# Patient Record
Sex: Female | Born: 1975 | Hispanic: No | Marital: Single | Smoking: Former smoker
Health system: Southern US, Community
[De-identification: ages and names within clinical notes are randomized; demographics above are authoritative.]

## PROBLEM LIST (undated history)

## (undated) DIAGNOSIS — Z862 Personal history of diseases of the blood and blood-forming organs and certain disorders involving the immune mechanism: Secondary | ICD-10-CM

## (undated) DIAGNOSIS — K219 Gastro-esophageal reflux disease without esophagitis: Secondary | ICD-10-CM

## (undated) DIAGNOSIS — Z8701 Personal history of pneumonia (recurrent): Secondary | ICD-10-CM

## (undated) DIAGNOSIS — F329 Major depressive disorder, single episode, unspecified: Secondary | ICD-10-CM

## (undated) DIAGNOSIS — U071 COVID-19: Secondary | ICD-10-CM

## (undated) DIAGNOSIS — I776 Arteritis, unspecified: Secondary | ICD-10-CM

## (undated) DIAGNOSIS — D219 Benign neoplasm of connective and other soft tissue, unspecified: Secondary | ICD-10-CM

## (undated) DIAGNOSIS — G44009 Cluster headache syndrome, unspecified, not intractable: Secondary | ICD-10-CM

## (undated) DIAGNOSIS — T8859XA Other complications of anesthesia, initial encounter: Secondary | ICD-10-CM

## (undated) DIAGNOSIS — R569 Unspecified convulsions: Secondary | ICD-10-CM

## (undated) DIAGNOSIS — I5189 Other ill-defined heart diseases: Secondary | ICD-10-CM

## (undated) DIAGNOSIS — Z8619 Personal history of other infectious and parasitic diseases: Secondary | ICD-10-CM

## (undated) DIAGNOSIS — F32A Depression, unspecified: Secondary | ICD-10-CM

## (undated) DIAGNOSIS — Z90721 Acquired absence of ovaries, unilateral: Secondary | ICD-10-CM

## (undated) DIAGNOSIS — D696 Thrombocytopenia, unspecified: Secondary | ICD-10-CM

## (undated) DIAGNOSIS — F419 Anxiety disorder, unspecified: Secondary | ICD-10-CM

## (undated) DIAGNOSIS — T4145XA Adverse effect of unspecified anesthetic, initial encounter: Secondary | ICD-10-CM

## (undated) DIAGNOSIS — H5440 Blindness, one eye, unspecified eye: Secondary | ICD-10-CM

## (undated) HISTORY — DX: Major depressive disorder, single episode, unspecified: F32.9

## (undated) HISTORY — PX: ABDOMINAL HYSTERECTOMY: SHX81

## (undated) HISTORY — PX: EYE SURGERY: SHX253

## (undated) HISTORY — PX: WISDOM TOOTH EXTRACTION: SHX21

## (undated) HISTORY — DX: Benign neoplasm of connective and other soft tissue, unspecified: D21.9

## (undated) HISTORY — PX: HYSTEROSCOPY WITH D & C: SHX1775

## (undated) HISTORY — PX: TUBAL LIGATION: SHX77

## (undated) HISTORY — DX: Anxiety disorder, unspecified: F41.9

## (undated) HISTORY — DX: Personal history of other infectious and parasitic diseases: Z86.19

## (undated) HISTORY — DX: Acquired absence of ovaries, unilateral: Z90.721

## (undated) HISTORY — PX: ENDOMETRIAL ABLATION: SHX621

## (undated) HISTORY — DX: Depression, unspecified: F32.A

## (undated) HISTORY — DX: Personal history of pneumonia (recurrent): Z87.01

## (undated) HISTORY — DX: Arteritis, unspecified: I77.6

## (undated) HISTORY — DX: Other ill-defined heart diseases: I51.89

## (undated) HISTORY — DX: Blindness, one eye, unspecified eye: H54.40

## (undated) HISTORY — DX: COVID-19: U07.1

---

## 1997-07-03 ENCOUNTER — Emergency Department (HOSPITAL_COMMUNITY): Admission: EM | Admit: 1997-07-03 | Discharge: 1997-07-03 | Payer: Self-pay | Admitting: Emergency Medicine

## 1998-07-07 ENCOUNTER — Other Ambulatory Visit: Admission: RE | Admit: 1998-07-07 | Discharge: 1998-07-07 | Payer: Self-pay | Admitting: *Deleted

## 2000-01-04 ENCOUNTER — Other Ambulatory Visit: Admission: RE | Admit: 2000-01-04 | Discharge: 2000-01-04 | Payer: Self-pay | Admitting: Obstetrics & Gynecology

## 2000-04-22 ENCOUNTER — Inpatient Hospital Stay (HOSPITAL_COMMUNITY): Admission: AD | Admit: 2000-04-22 | Discharge: 2000-04-22 | Payer: Self-pay | Admitting: Obstetrics and Gynecology

## 2000-06-11 ENCOUNTER — Inpatient Hospital Stay (HOSPITAL_COMMUNITY): Admission: AD | Admit: 2000-06-11 | Discharge: 2000-06-11 | Payer: Self-pay | Admitting: Obstetrics and Gynecology

## 2000-06-12 ENCOUNTER — Inpatient Hospital Stay (HOSPITAL_COMMUNITY): Admission: AD | Admit: 2000-06-12 | Discharge: 2000-06-14 | Payer: Self-pay | Admitting: Obstetrics and Gynecology

## 2000-10-16 ENCOUNTER — Inpatient Hospital Stay (HOSPITAL_COMMUNITY): Admission: AD | Admit: 2000-10-16 | Discharge: 2000-10-16 | Payer: Self-pay | Admitting: *Deleted

## 2003-08-25 ENCOUNTER — Inpatient Hospital Stay (HOSPITAL_COMMUNITY): Admission: AD | Admit: 2003-08-25 | Discharge: 2003-08-25 | Payer: Self-pay | Admitting: *Deleted

## 2003-08-25 IMAGING — US US OB COMP +14 WK
1 series · 18 of 28 positions shown · non-contrast
Comparison: none

CLINICAL DATA: Pelvic pressure sensations at 15 weeks 3 days of pregnancy by LMP; no prenatal care

[Series 1: us ob comp +14 wk · 18 of 53 slices shown]
[im 1/53]
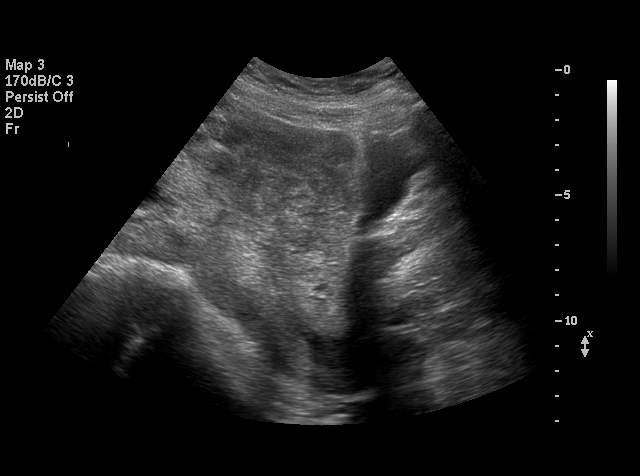
[im 4/53]
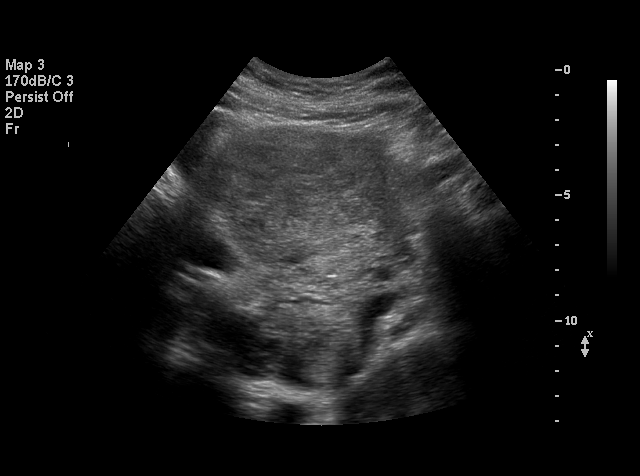
[im 6/53]
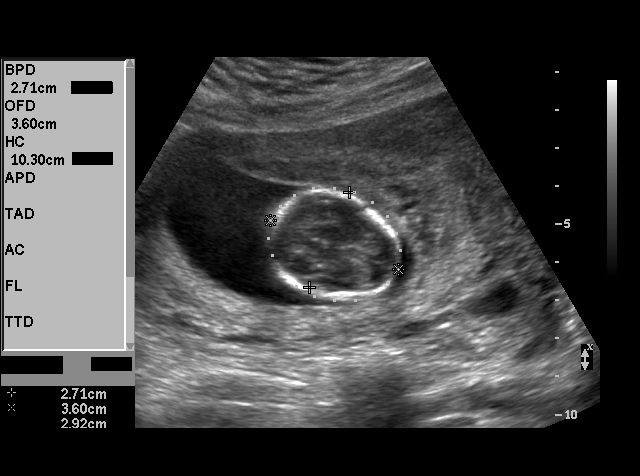
[im 10/53]
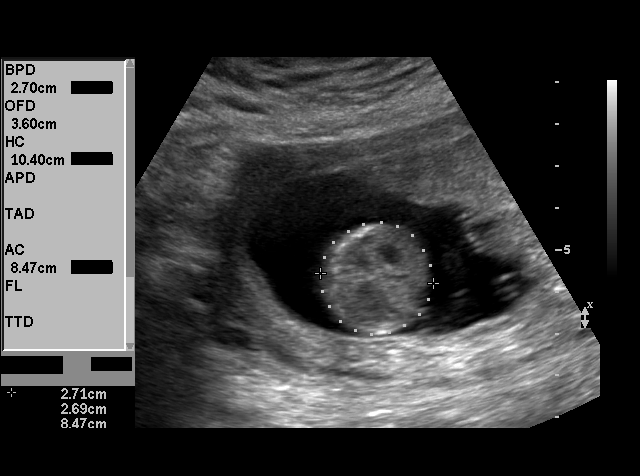
[im 14/53]
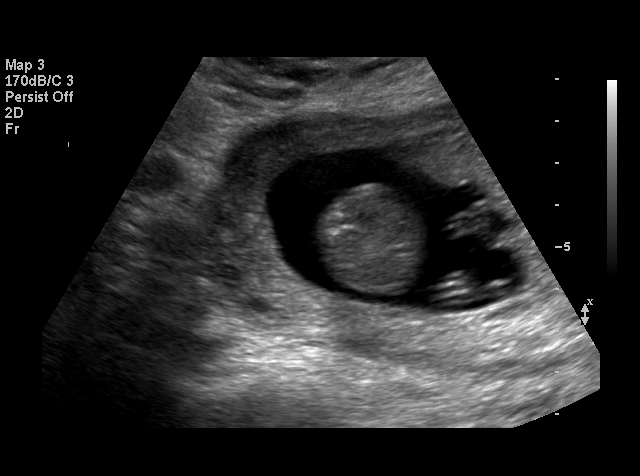
[im 16/53]
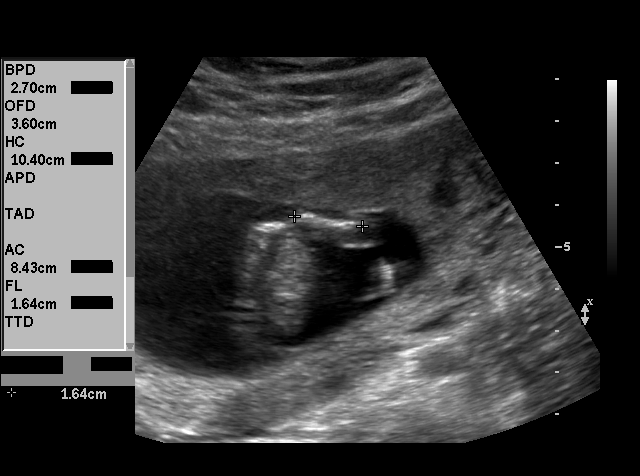
[im 20/53]
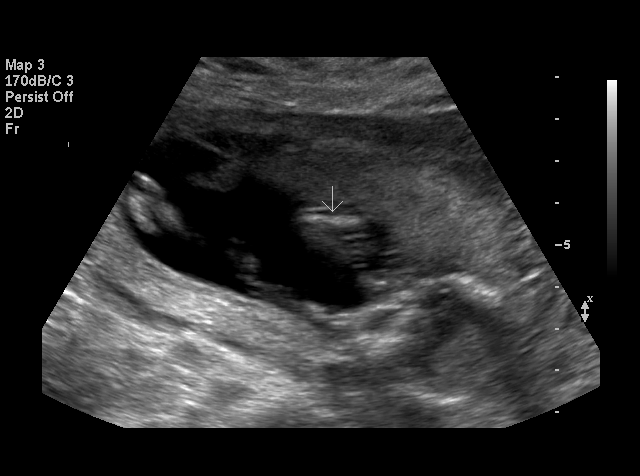
[im 22/53]
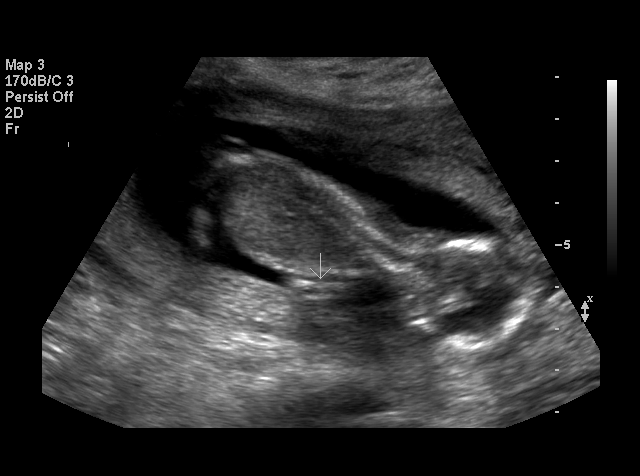
[im 26/53]
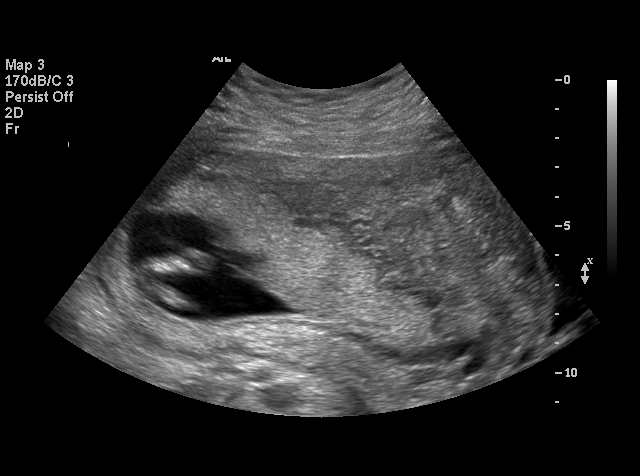
[im 27/53]
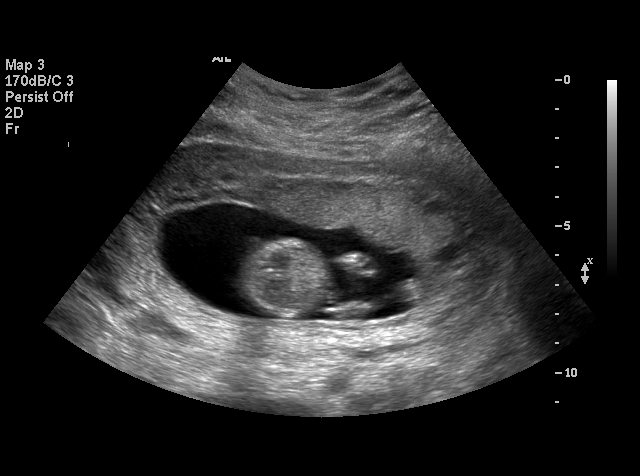
[im 31/53]
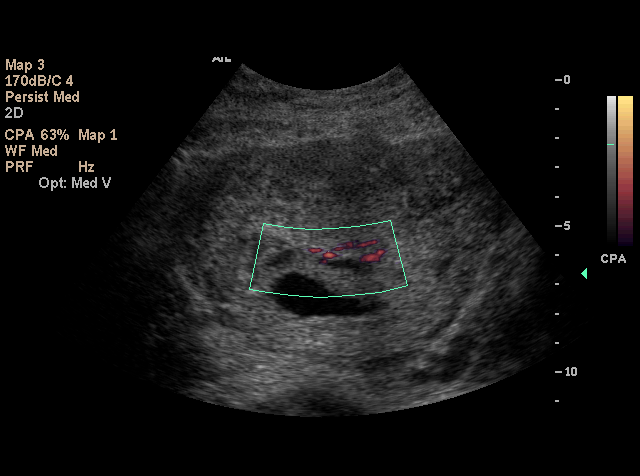
[im 33/53]
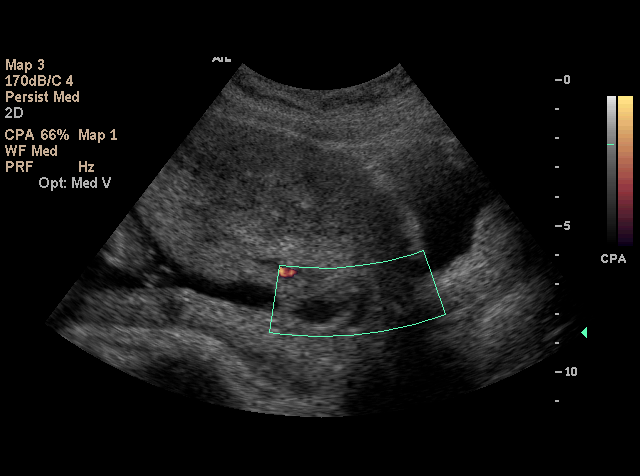
[im 37/53]
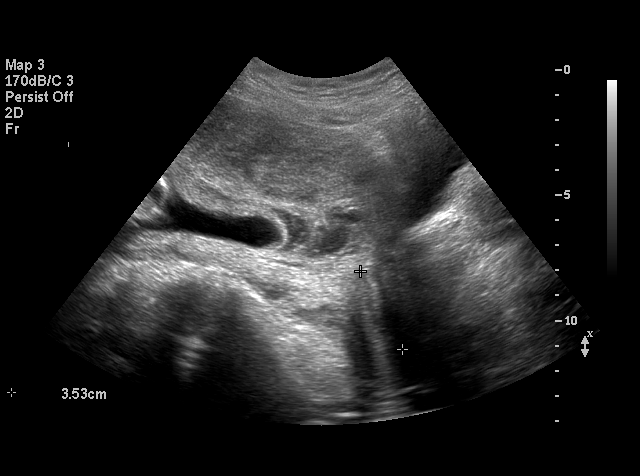
[im 41/53]
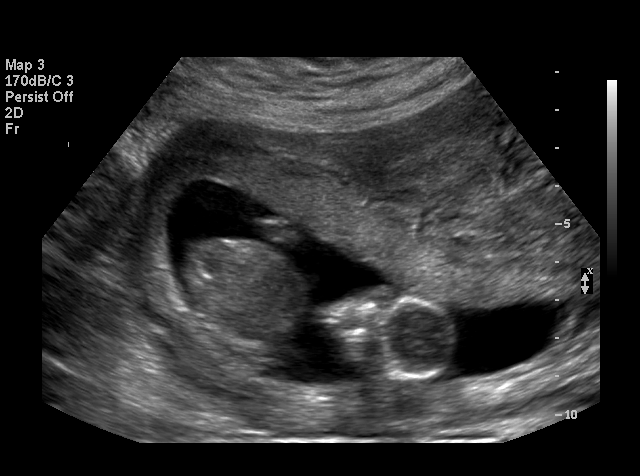
[im 43/53]
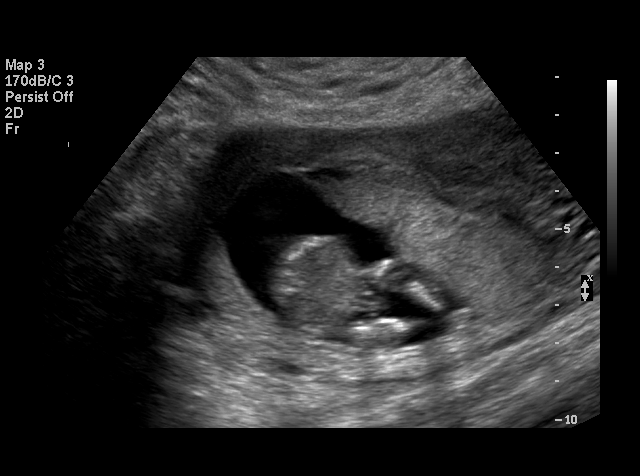
[im 47/53]
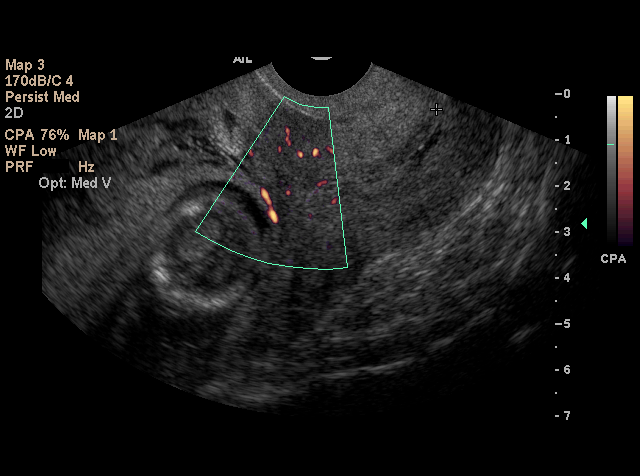
[im 49/53]
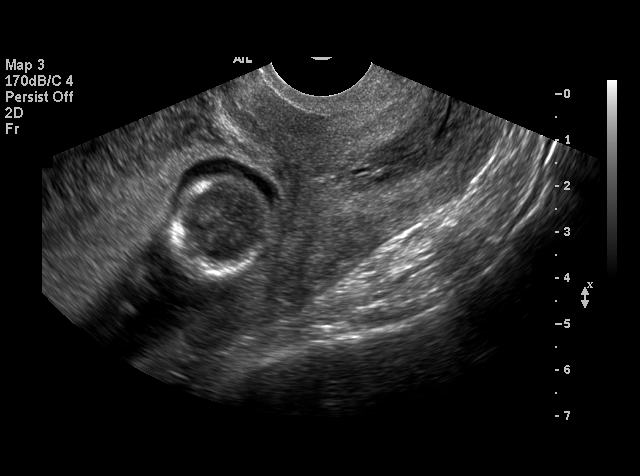
[im 53/53]
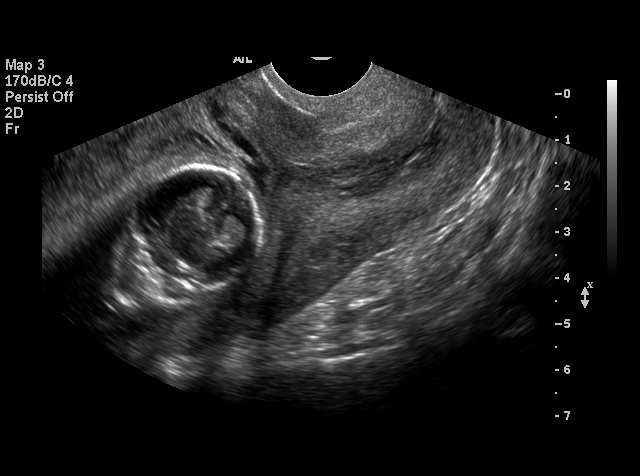

[18 of 28 positions shown; findings below may reference images not displayed]

OBSTETRICAL ULTRASOUND
 Number of Fetuses:  Single
 Heart Rate:  154 beats per minute 
 Movement:  Yes
 Breathing:   No  
 Presentation:  Variable
 Placental Location:  Anterior
 Grade:  0
 Previa:  Complete
 Amniotic Fluid (Subjective):  Normal
 Amniotic Fluid (Objective):   3.5 cm (Vertical pocket)

 FETAL BIOMETRY
 BPD:   2.7 cm, 14 weeks 6 days
 HC:   10.4 cm, 15 weeks 0 days
 AC:  8.4 cm, 14 weeks 6 days
 FL:  1.6 cm, 14 weeks 6 days

 MEAN GA:  15 weeks 0 days

 EFW:  N/A

 FETAL ANATOMY
 Choroid plexus:  Visualized 
 Thalami/CSP:    Visualized   
 Posterior Fossa:  Not visualized 
 Nuchal Region:  Not visualized 
 Spine:    Not visualized 
 4 Chamber Heart on Left:    Not visualized 
 Stomach on Left:    Visualized 
 3 Vessel Cord:  Visualized 
 Cord Insertion site:  Visualized 
 Kidneys:  Visualized 
 Bladder:  Visualized 
 Extremities:    Visualized 

 ADDITIONAL ANATOMY VISUALIZED:  N/A

 Evaluation limited by:  Advanced gestational age 

 MATERNAL FINDINGS
 Cervix:   4.4 cm
IMPRESSION: 1.  Single live intrauterine gestation in a variable presentation with an estimated gestational age by today?s measurements of 15 weeks and 0 days.  
 2.  Placenta previa.

 </u12:p>

## 2003-09-07 ENCOUNTER — Emergency Department (HOSPITAL_COMMUNITY): Admission: EM | Admit: 2003-09-07 | Discharge: 2003-09-08 | Payer: Self-pay | Admitting: Emergency Medicine

## 2003-09-08 ENCOUNTER — Emergency Department (HOSPITAL_COMMUNITY): Admission: EM | Admit: 2003-09-08 | Discharge: 2003-09-08 | Payer: Self-pay | Admitting: Emergency Medicine

## 2003-09-08 IMAGING — CT CT HEAD W/O CM
2 of 3 series · 14 of 33 positions shown, 18 images · non-contrast
Comparison: none

CLINICAL DATA: Severe headaches, question underlying tumor or intracranial abnormality, pregnant patient.  
 CT HEAD WITHOUT CONTRAST 
 Patient was carefully shielded for the study. 
 There are no midline shifts or mass effects and the ventricles are normal in size and contour.  There is no evidence for subarachnoid hemorrhage.  There are no extra-axial fluid collections.  There is a small retention cyst seen within the posterior right maxillary sinus.  There are no sinus air-fluid levels and the mastoid air cells appear normally aerated.  There are no areas of bone destruction.  
 IMPRESSION
 Normal intracranial study.  Small retention cyst within the right maxillary sinus.  Otherwise normal study.

[Series 2: — · sagittal · 0.67mm/px · 1 of 3 slices shown (1 of 2)]
[im 2/3  brain]
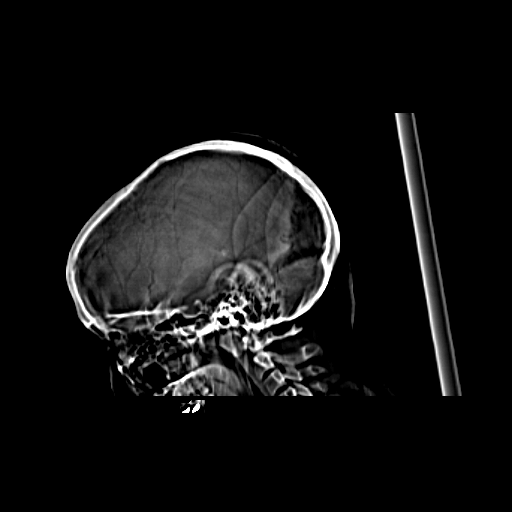

[Series 3: — · axial · 0.43mm/px · z∈[-119,+1]mm · 13 of 28 slices shown, 17 images (2 of 2)]
[im 2/28  brain]
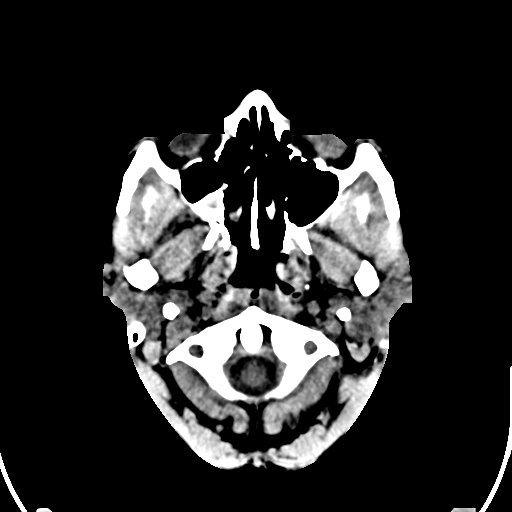
[im 2/28  bone]
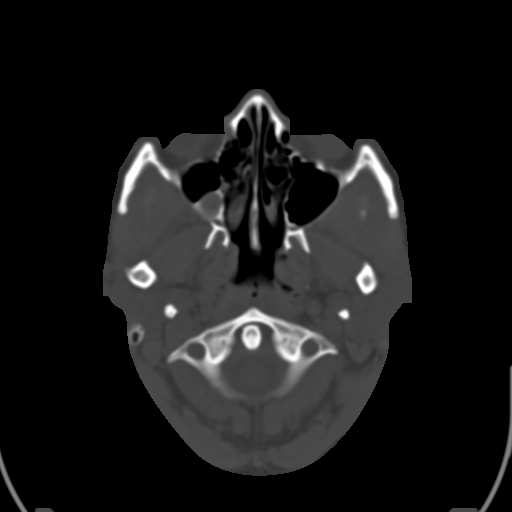
[im 4/28  brain]
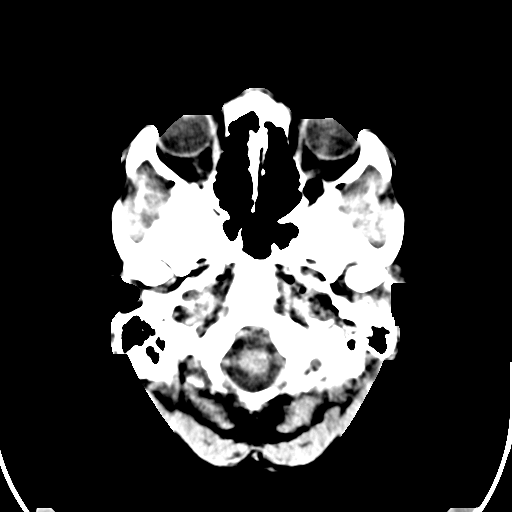
[im 6/28  brain]
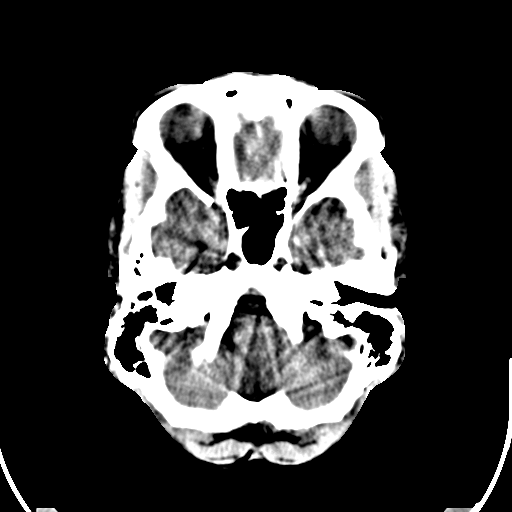
[im 8/28  brain]
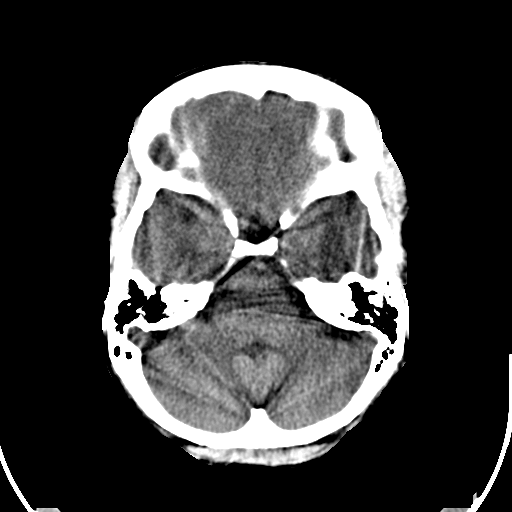
[im 10/28  brain]
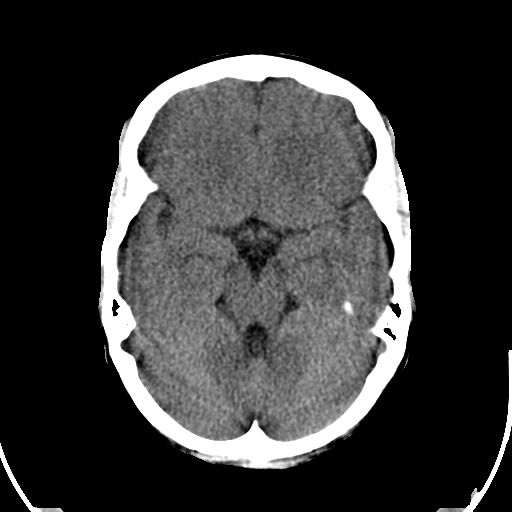
[im 10/28  bone]
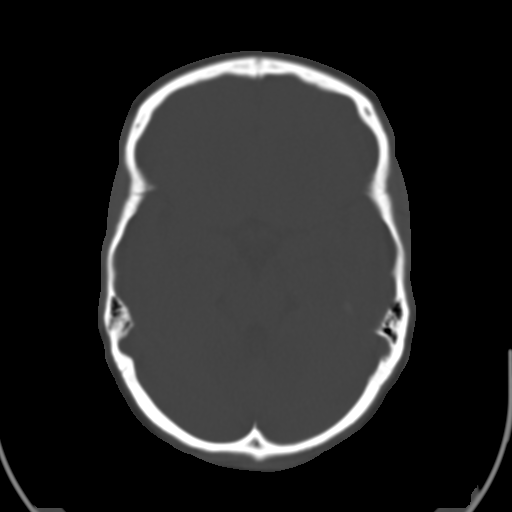
[im 12/28  brain]
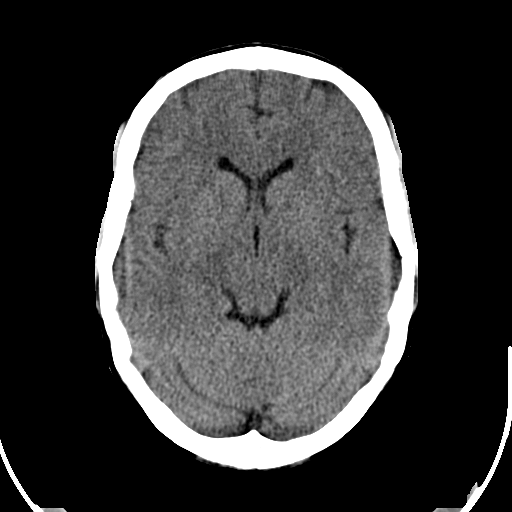
[im 14/28  brain]
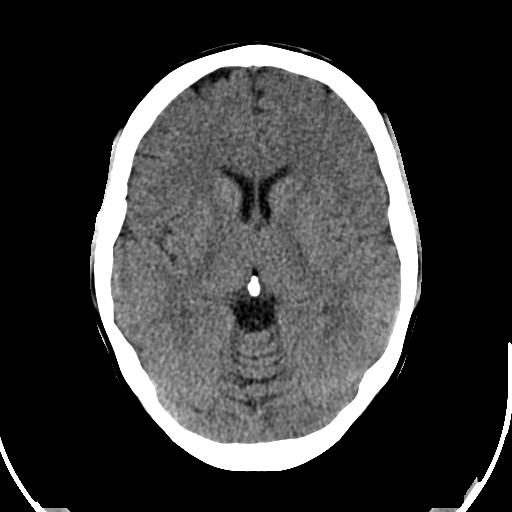
[im 16/28  brain]
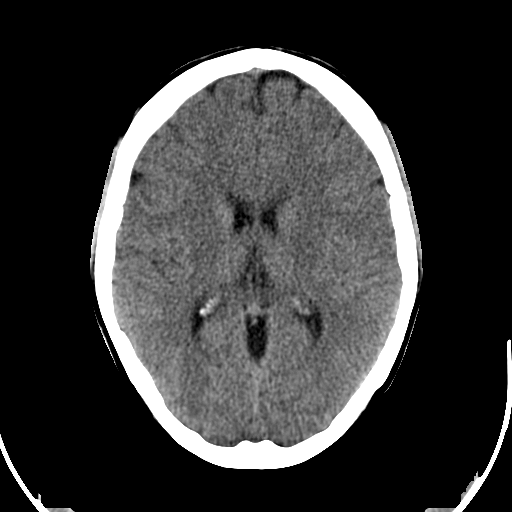
[im 18/28  brain]
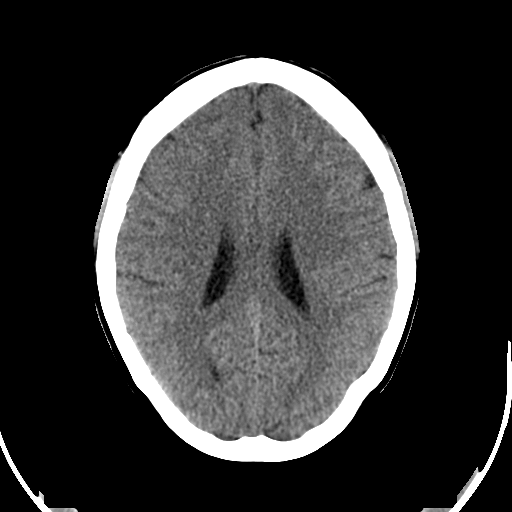
[im 18/28  bone]
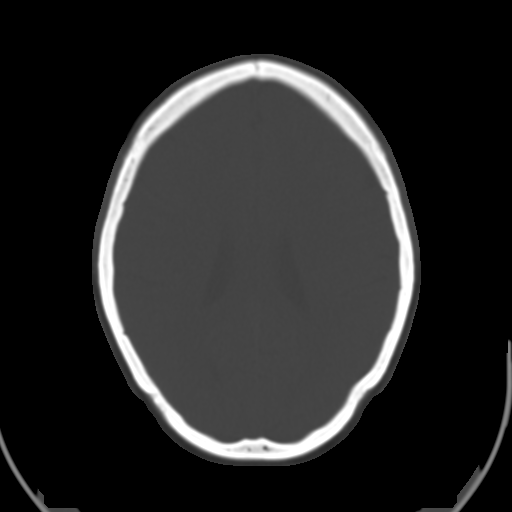
[im 20/28  brain]
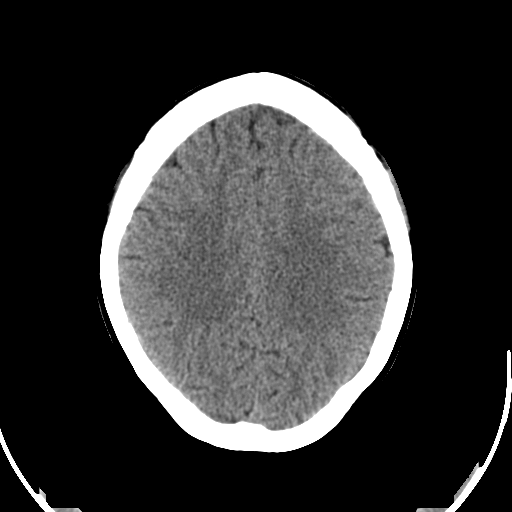
[im 22/28  brain]
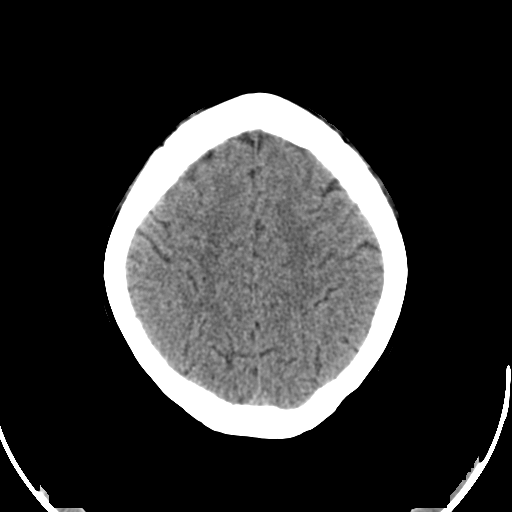
[im 24/28  brain]
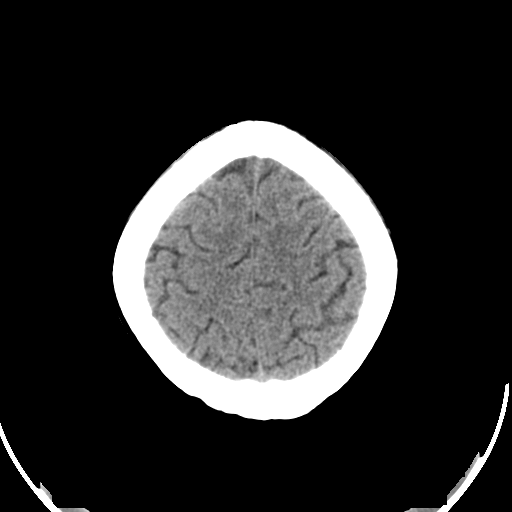
[im 26/28  brain]
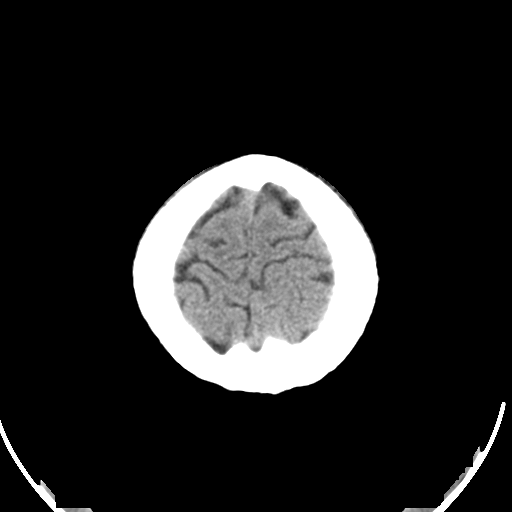
[im 26/28  bone]
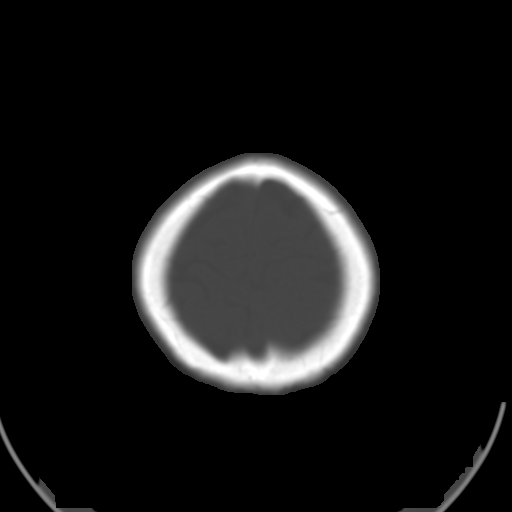

[14 of 33 positions shown; findings below may reference images not displayed]

## 2003-09-16 ENCOUNTER — Ambulatory Visit (HOSPITAL_COMMUNITY): Admission: RE | Admit: 2003-09-16 | Discharge: 2003-09-16 | Payer: Self-pay | Admitting: *Deleted

## 2003-09-16 IMAGING — US US OB FOLLOW-UP
1 series · 12 of 28 positions shown · non-contrast
Comparison: none

CLINICAL DATA: Anatomic exam.  Reevaluate placental position.  
The patient presented initially on [DATE] and was reevaluated on [DATE].

[Series 1: unknown · 0.32mm/px · 12 of 70 slices shown]
[im 3/70]
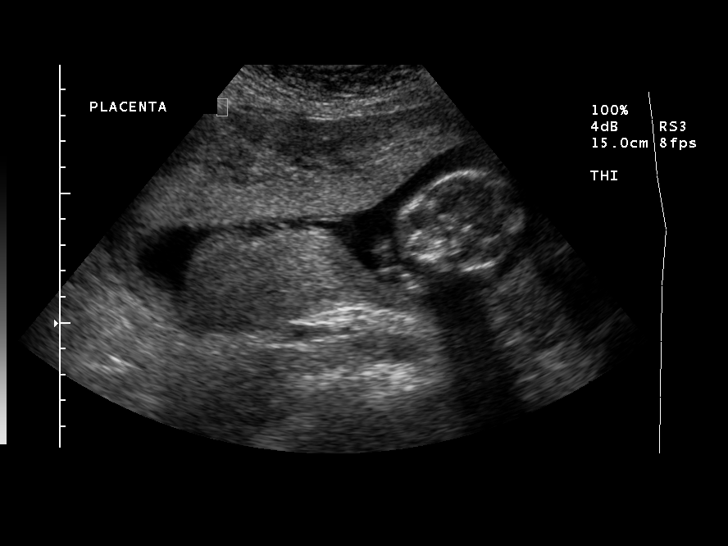
[im 8/70]
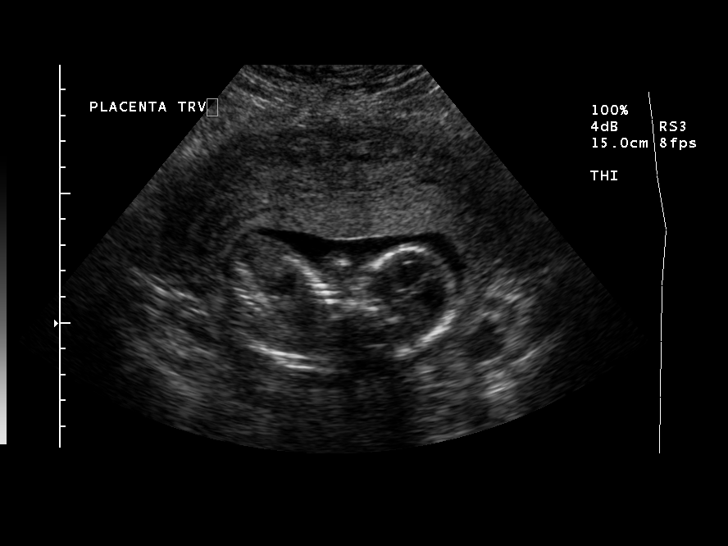
[im 13/70]
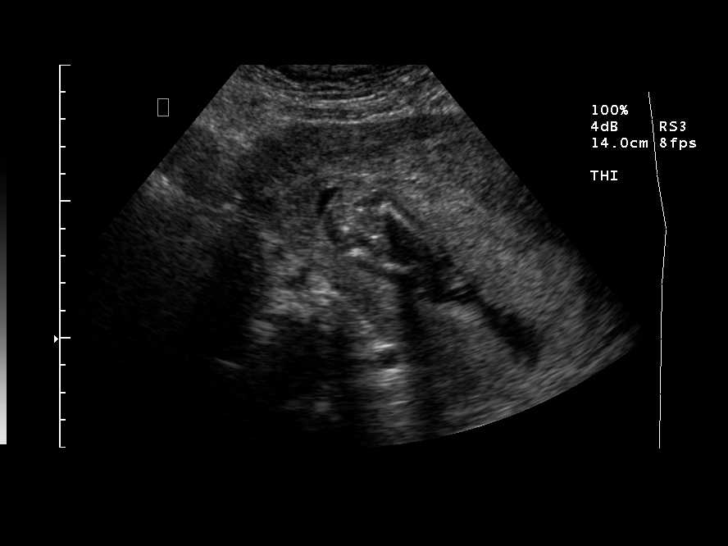
[im 21/70]
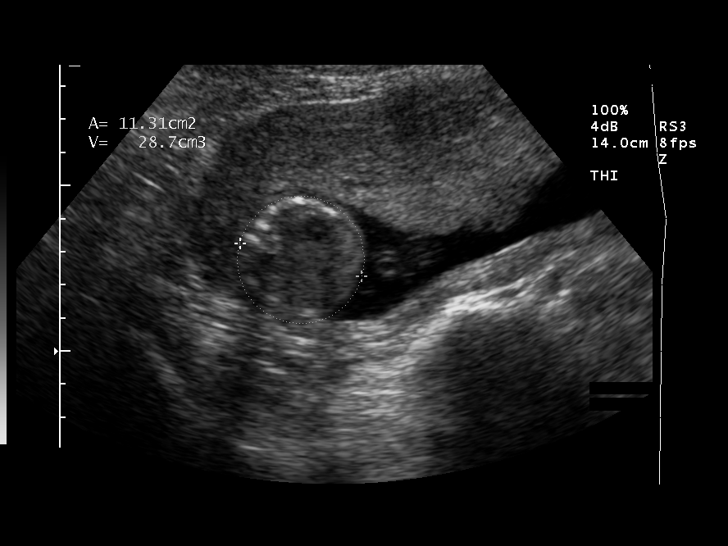
[im 26/70]
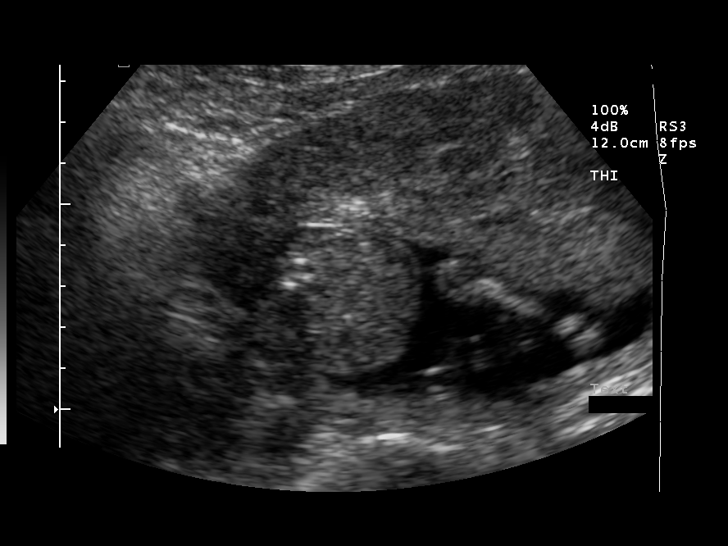
[im 31/70]
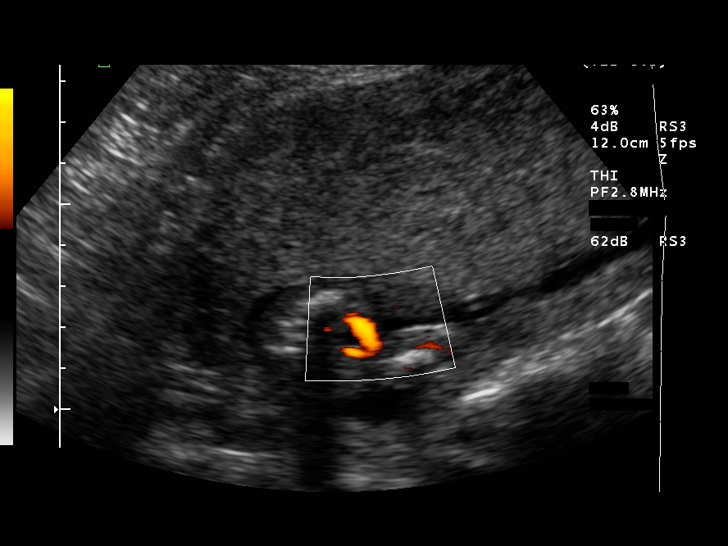
[im 39/70]
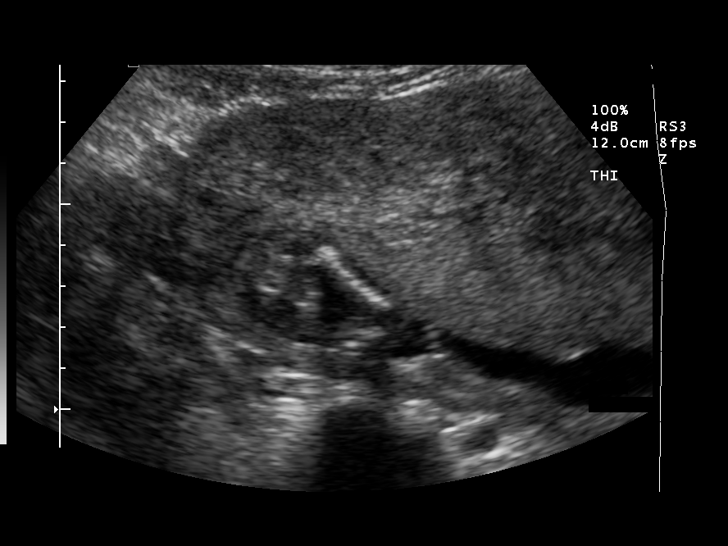
[im 44/70]
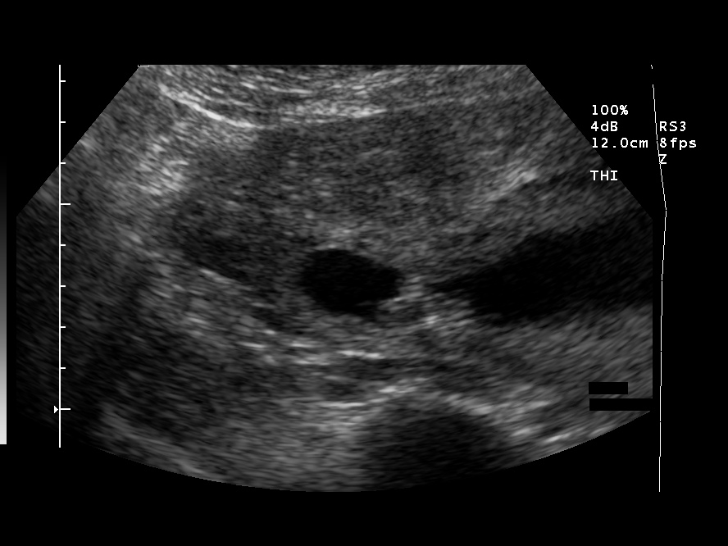
[im 49/70]
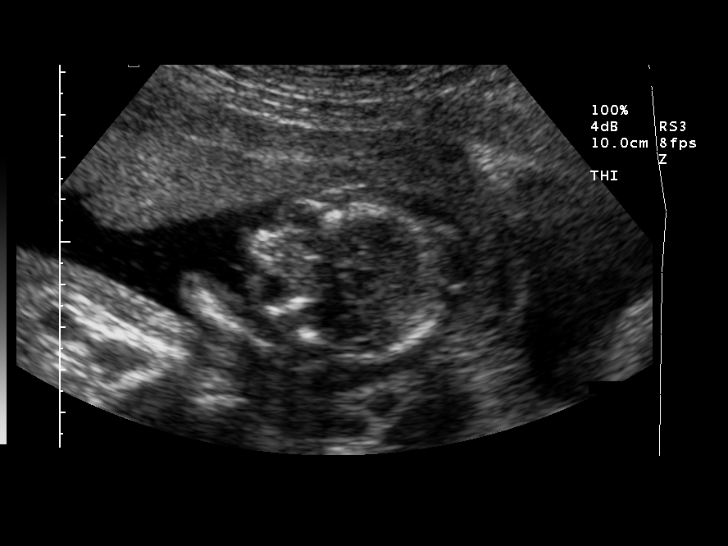
[im 57/70]
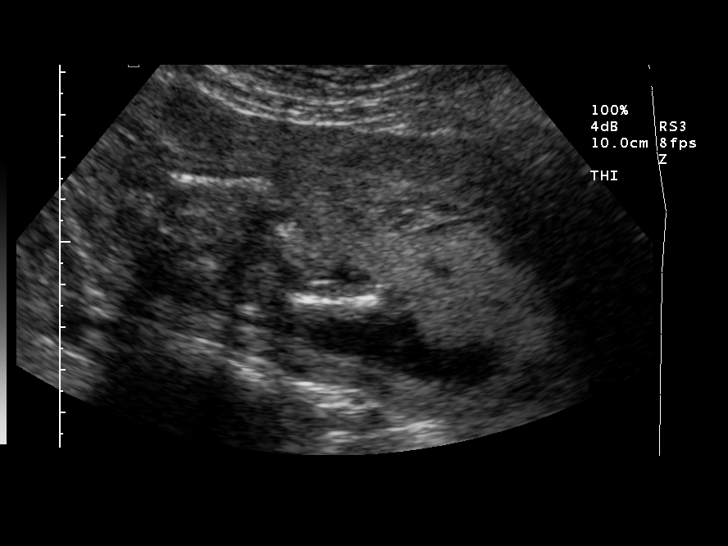
[im 62/70]
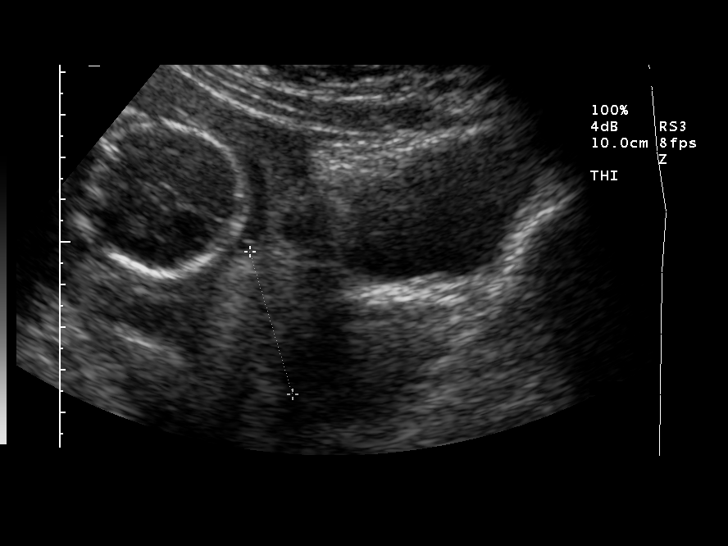
[im 67/70]
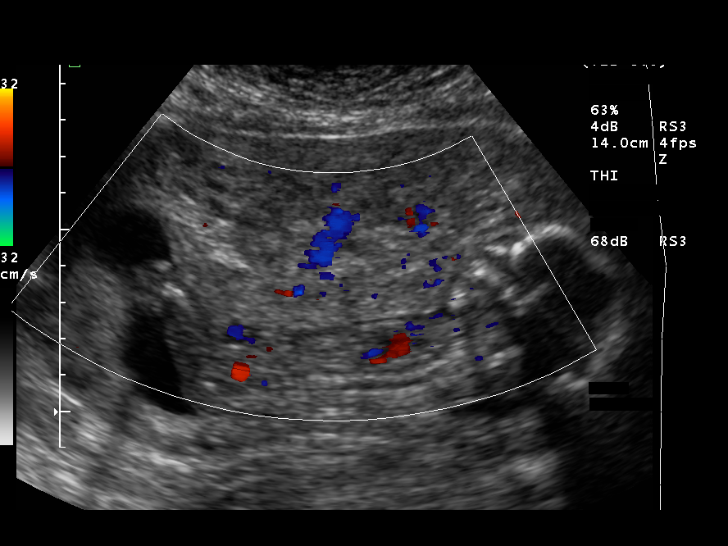

[12 of 28 positions shown; findings below may reference images not displayed]

OBSTETRICAL ULTRASOUND RE-EVALUATION
Number of Fetuses:  1
Heart Rate:  142
Movement:  Yes
Breathing:  No
Presentation:  Cephalic
Placental Location:  Anterior
Grade:  I
Previa:  No
Comment:  When the patient presented on [DATE] persistent uterine contractions were seen.  On [DATE] the anterior myometrium appeared diffusely, persistently thickened measuring 3.1 cm in width.
Amniotic Fluid (subjective):  Low normal

FETAL BIOMETRY
BPD:  3.6 cm   17 w 1 d
HC:  14.2 cm   17 w 4 d
AC:  11.9 cm   17 w 4 d
FL:  2.4 cm   17 w 2 d
HL:  2.3 cm  16 w 6 d
Mean GA:  17 w 2 d
Assigned GA:      18 w 4 d (LMP)

FETAL ANATOMY
Lateral Ventricles:  Visualized 
Thalami/CSP:  Visualized 
Posterior Fossa:  Visualized 
Nuchal Region:  Visualized 
Spine:  Visualized 
4 Chamber Heart on Left:  Visualized 
Stomach on Left:  Visualized 
3 Vessel Cord:  Visualized 
Cord Insertion Site:  Visualized 
Kidneys:  Visualized 
Bladder:  Visualized 
Extremities:  Visualized 

ADDITIONAL ANATOMY VISUALIZED:  LVOT, RVOT, upper lip, orbits, profile, diaphragm, heel, 5th digit, ductal arch, aortic arch, and female genitalia

MATERNAL FINDINGS
Cervix:  3.5 cm Transabdominally
IMPRESSION: Single intrauterine pregnancy demonstrating an estimated gestational age by ultrasound of 17 weeks and 2 days.  Fetal parameters correlate well with this composite estimated gestational age and this is 6 days behind expected estimated gestational age by prior recent exam on [DATE] at 15 weeks of gestation.  
The amniotic fluid volume on [DATE] was questionably low-to-low normal, but this was felt possibly to be due to persistent uterine contractions.  The patient was brought back on [DATE] for reassessment.  Subjectively today the amniotic fluid volume appears in the lower range of normal.  The anterior myometrium appears diffusely, mildly thickened measuring 3.1 cm in AP width suggesting the possibility of some degree of uterine contraction and this may spuriously, slightly lower the perception of the degree of amniotic fluid volume.  Because the fetus measures approximately 1 week behind recent exam, follow-up evaluation in 1 ? weeks is recommended for initial short term reassessment of the amniotic fluid volume and reassessment of growth to exclude the possibility of early developing intrauterine growth restrictive pattern.
No focal fetal abnormalities are identified and a good anatomic exam was possible over the course of the two days of evaluation.  Anatomic review was performed by both Dr. MOOLMAN on [DATE] and myself on 7-14.  No soft markers for Down syndrome were apparent.
No evidence for persistent placenta previa. 
Normal cervical length.  
The rationale for short term follow-up was explained to the patient and this was scheduled at her convenience on [DATE].

## 2003-09-17 IMAGING — US US OB FOLLOW-UP
1 series · 12 of 28 positions shown · non-contrast
Comparison: none

CLINICAL DATA: Anatomic exam.  Reevaluate placental position.  
The patient presented initially on [DATE] and was reevaluated on [DATE].

[Series 1: unknown · 0.35mm/px · 12 of 80 slices shown]
[im 3/80]
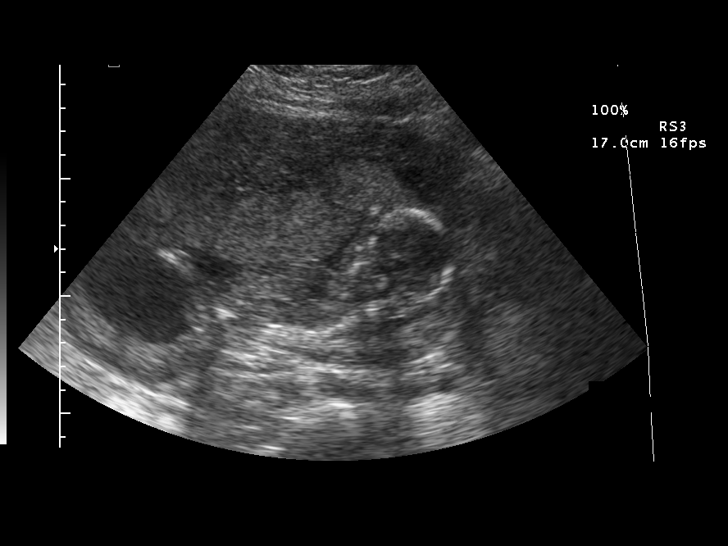
[im 9/80]
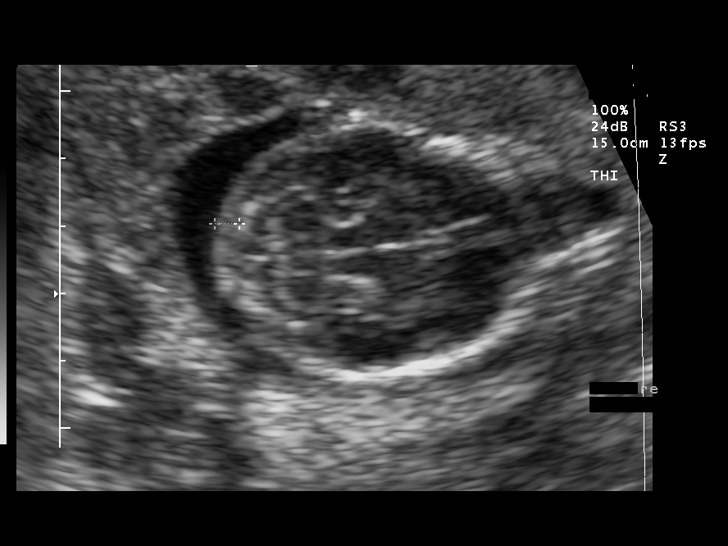
[im 15/80]
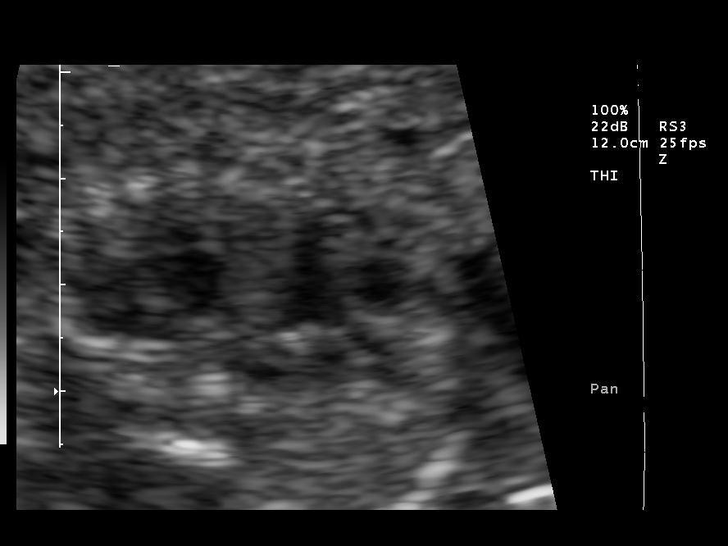
[im 24/80]
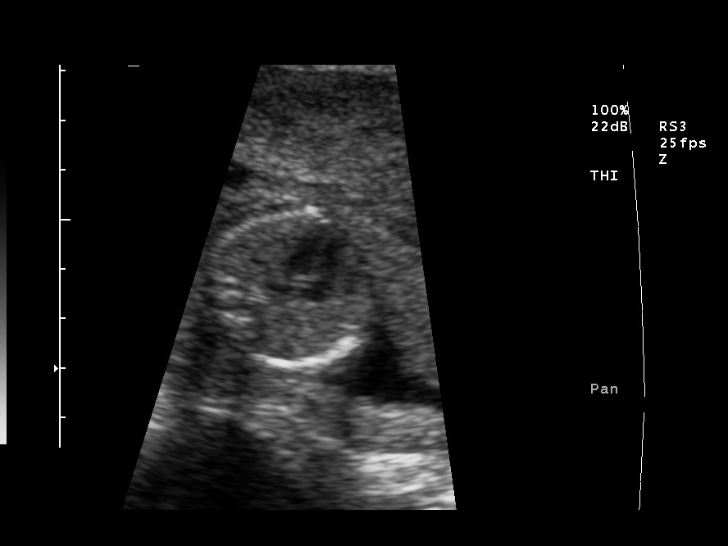
[im 30/80]
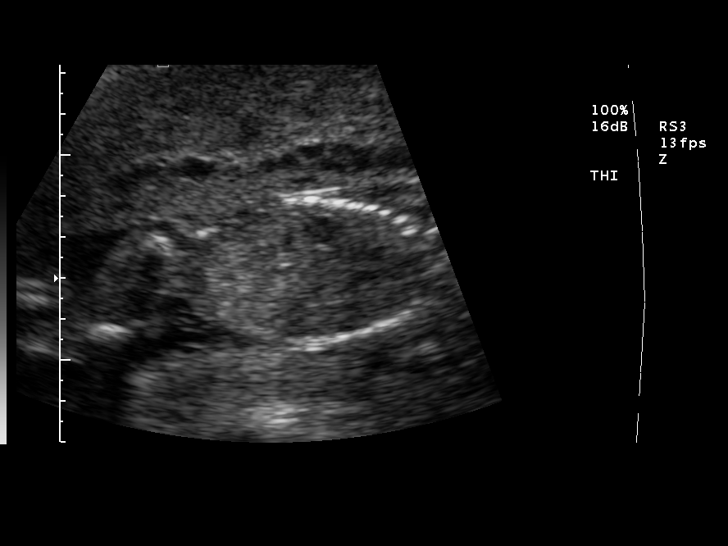
[im 36/80]
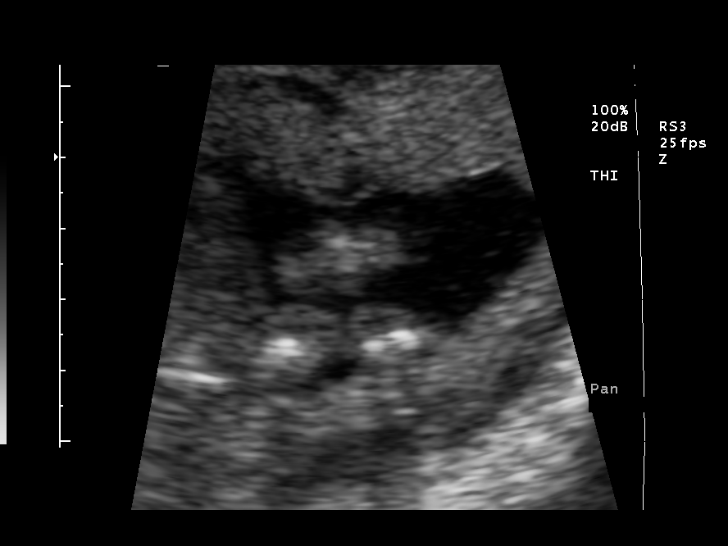
[im 44/80]
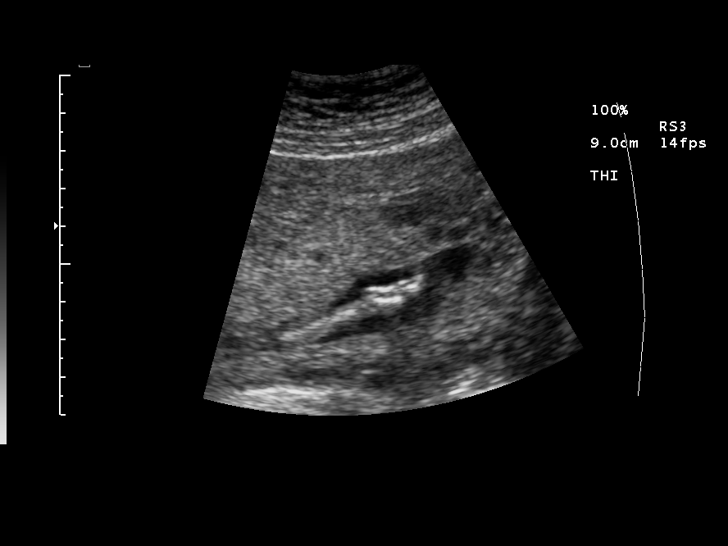
[im 50/80]
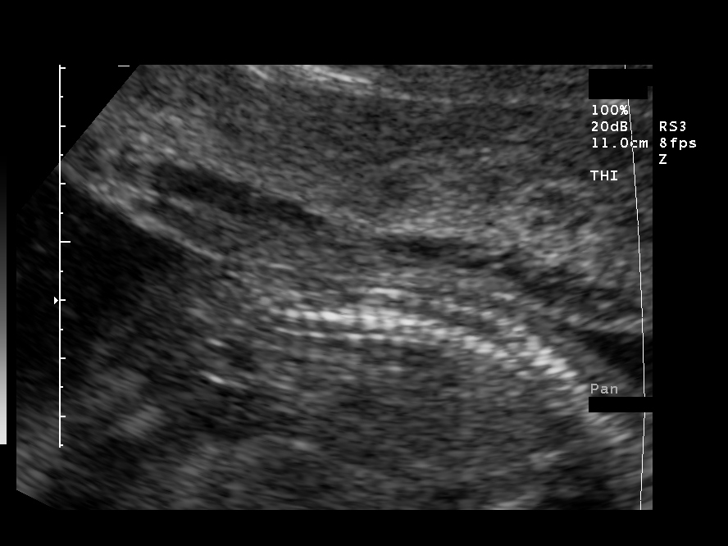
[im 56/80]
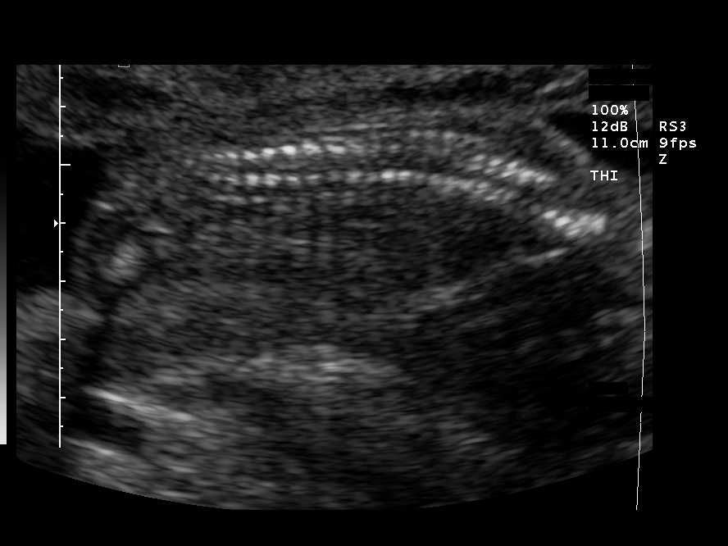
[im 65/80]
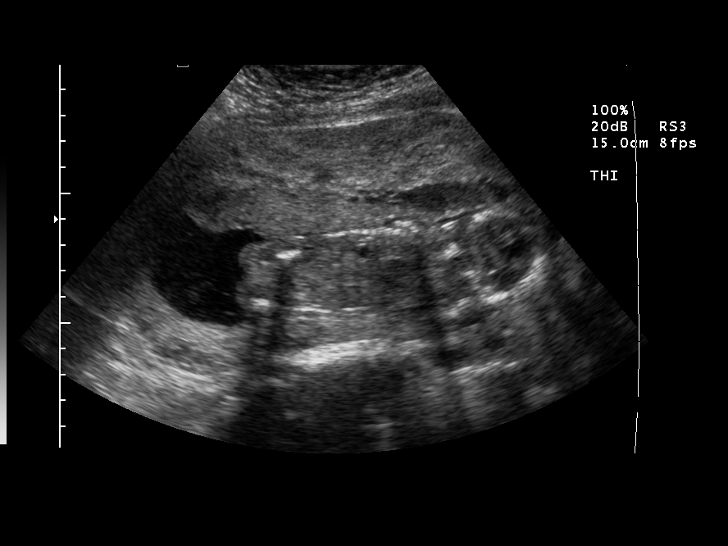
[im 71/80]
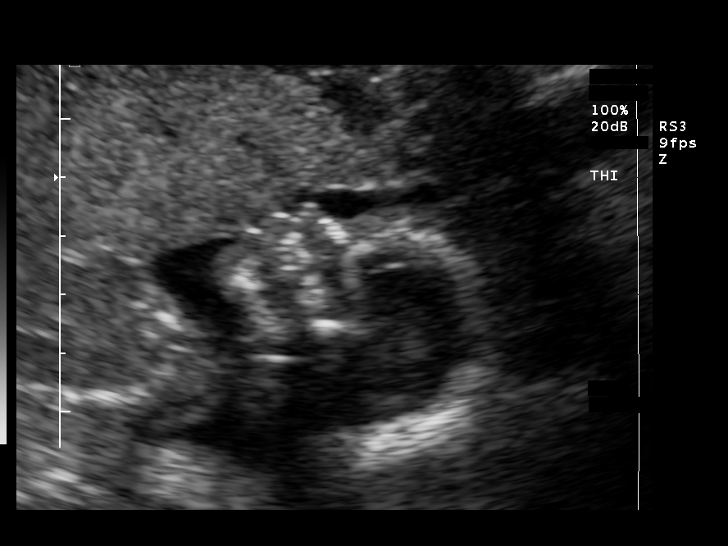
[im 77/80]
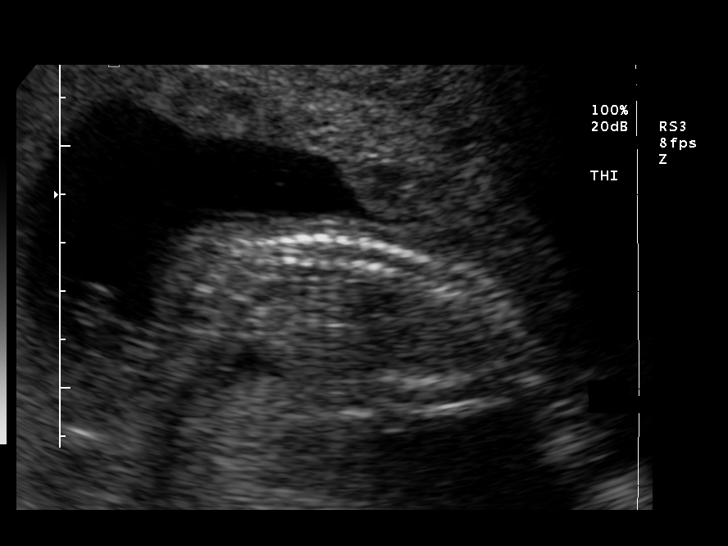

[12 of 28 positions shown; findings below may reference images not displayed]

OBSTETRICAL ULTRASOUND RE-EVALUATION
Number of Fetuses:  1
Heart Rate:  142
Movement:  Yes
Breathing:  No
Presentation:  Cephalic
Placental Location:  Anterior
Grade:  I
Previa:  No
Comment:  When the patient presented on [DATE] persistent uterine contractions were seen.  On [DATE] the anterior myometrium appeared diffusely, persistently thickened measuring 3.1 cm in width.
Amniotic Fluid (subjective):  Low normal

FETAL BIOMETRY
BPD:  3.6 cm   17 w 1 d
HC:  14.2 cm   17 w 4 d
AC:  11.9 cm   17 w 4 d
FL:  2.4 cm   17 w 2 d
HL:  2.3 cm  16 w 6 d
Mean GA:  17 w 2 d
Assigned GA:      18 w 4 d (LMP)

FETAL ANATOMY
Lateral Ventricles:  Visualized 
Thalami/CSP:  Visualized 
Posterior Fossa:  Visualized 
Nuchal Region:  Visualized 
Spine:  Visualized 
4 Chamber Heart on Left:  Visualized 
Stomach on Left:  Visualized 
3 Vessel Cord:  Visualized 
Cord Insertion Site:  Visualized 
Kidneys:  Visualized 
Bladder:  Visualized 
Extremities:  Visualized 

ADDITIONAL ANATOMY VISUALIZED:  LVOT, RVOT, upper lip, orbits, profile, diaphragm, heel, 5th digit, ductal arch, aortic arch, and female genitalia

MATERNAL FINDINGS
Cervix:  3.5 cm Transabdominally
IMPRESSION: Single intrauterine pregnancy demonstrating an estimated gestational age by ultrasound of 17 weeks and 2 days.  Fetal parameters correlate well with this composite estimated gestational age and this is 6 days behind expected estimated gestational age by prior recent exam on [DATE] at 15 weeks of gestation.  
The amniotic fluid volume on [DATE] was questionably low-to-low normal, but this was felt possibly to be due to persistent uterine contractions.  The patient was brought back on [DATE] for reassessment.  Subjectively today the amniotic fluid volume appears in the lower range of normal.  The anterior myometrium appears diffusely, mildly thickened measuring 3.1 cm in AP width suggesting the possibility of some degree of uterine contraction and this may spuriously, slightly lower the perception of the degree of amniotic fluid volume.  Because the fetus measures approximately 1 week behind recent exam, follow-up evaluation in 1 ? weeks is recommended for initial short term reassessment of the amniotic fluid volume and reassessment of growth to exclude the possibility of early developing intrauterine growth restrictive pattern.
No focal fetal abnormalities are identified and a good anatomic exam was possible over the course of the two days of evaluation.  Anatomic review was performed by both Dr. MOOLMAN on [DATE] and myself on 7-14.  No soft markers for Down syndrome were apparent.
No evidence for persistent placenta previa. 
Normal cervical length.  
The rationale for short term follow-up was explained to the patient and this was scheduled at her convenience on [DATE].

## 2003-09-25 ENCOUNTER — Other Ambulatory Visit: Admission: RE | Admit: 2003-09-25 | Discharge: 2003-09-25 | Payer: Self-pay | Admitting: Obstetrics and Gynecology

## 2003-09-28 ENCOUNTER — Ambulatory Visit (HOSPITAL_COMMUNITY): Admission: RE | Admit: 2003-09-28 | Discharge: 2003-09-28 | Payer: Self-pay | Admitting: Family Medicine

## 2003-09-28 IMAGING — US US OB FOLLOW-UP
1 series · 13 of 28 positions shown · non-contrast
Comparison: none

CLINICAL DATA: Reassess growth, fluid and myometrial thickness.

[Series 1: unknown · 0.29mm/px · 13 of 50 slices shown]
[im 2/50]
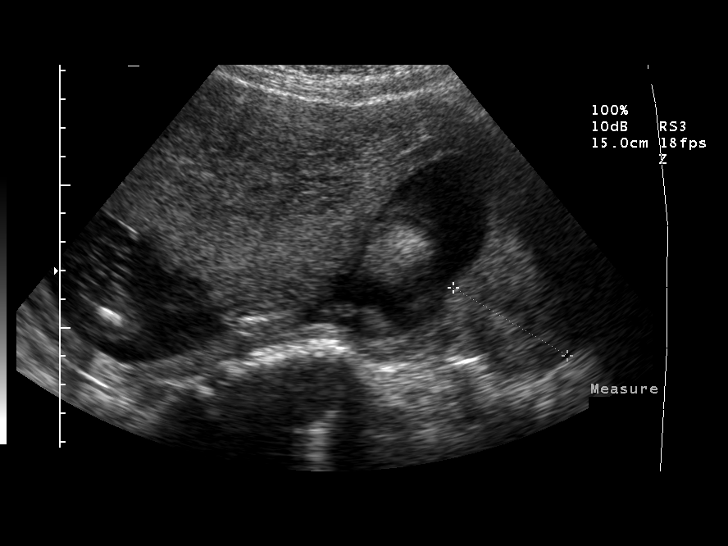
[im 6/50]
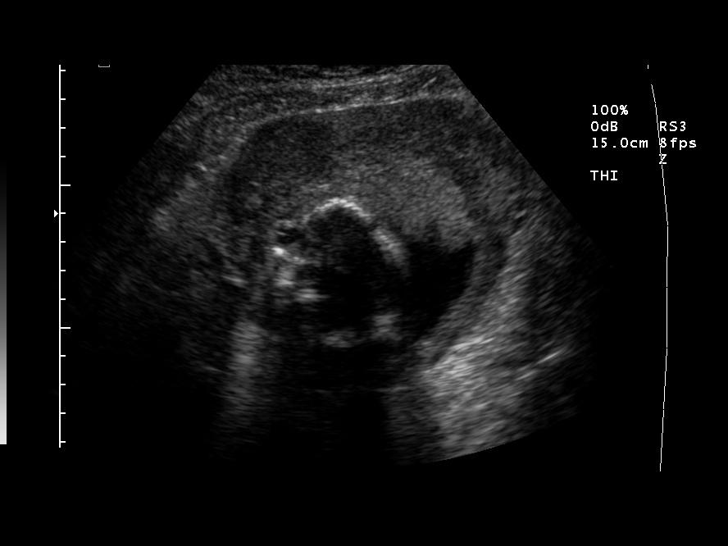
[im 10/50]
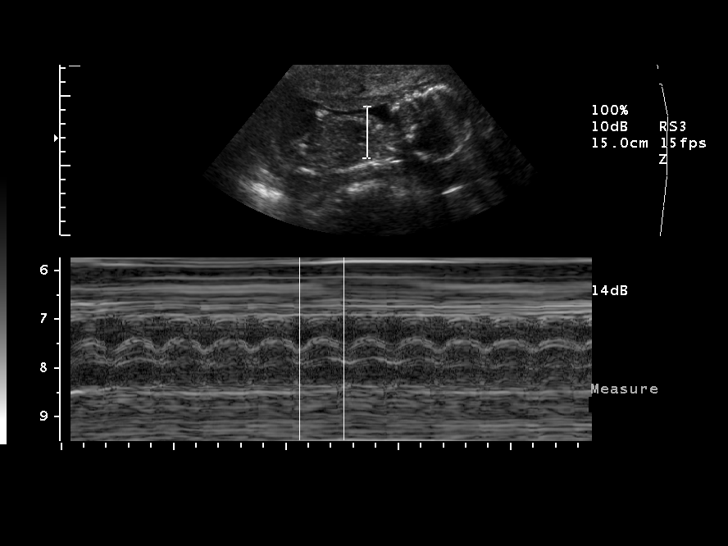
[im 13/50]
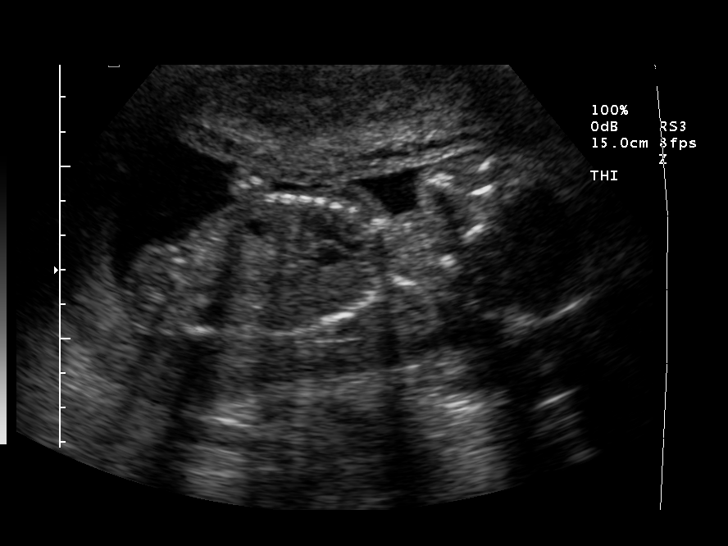
[im 17/50]
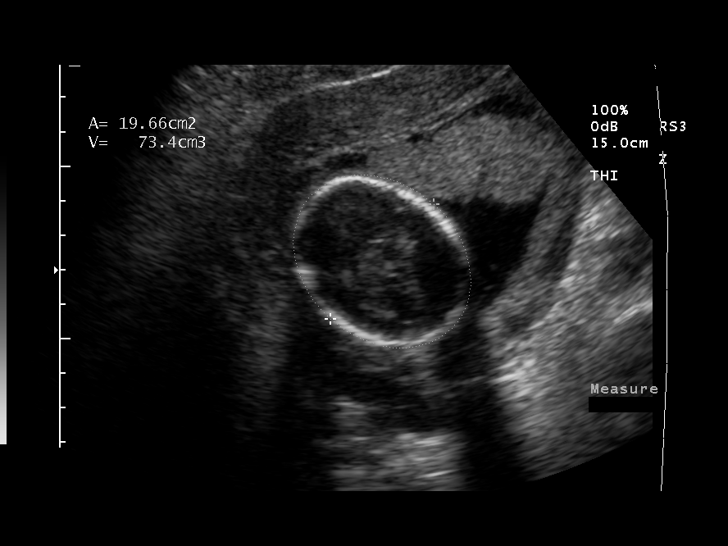
[im 20/50]
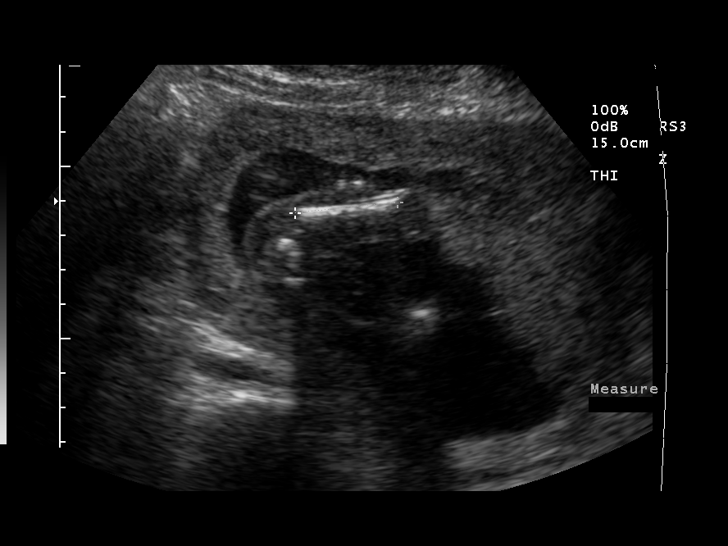
[im 26/50]
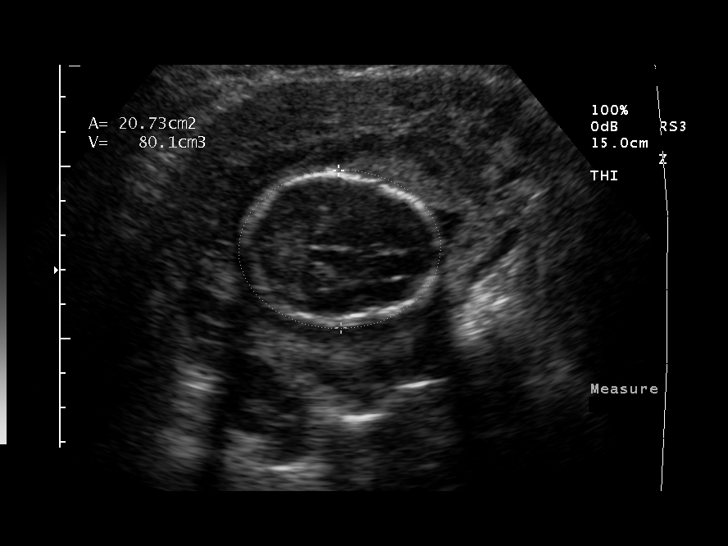
[im 30/50]
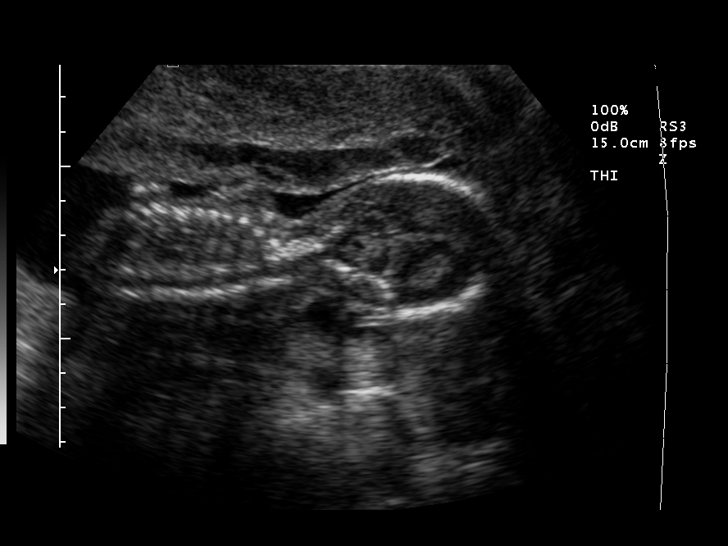
[im 33/50]
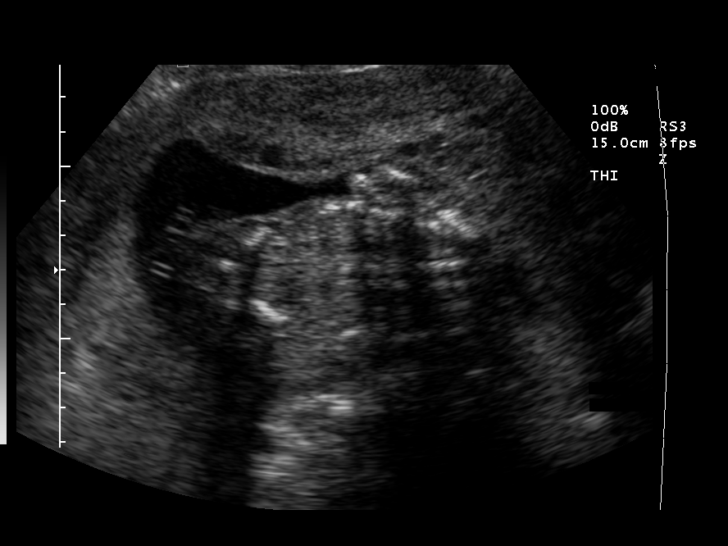
[im 37/50]
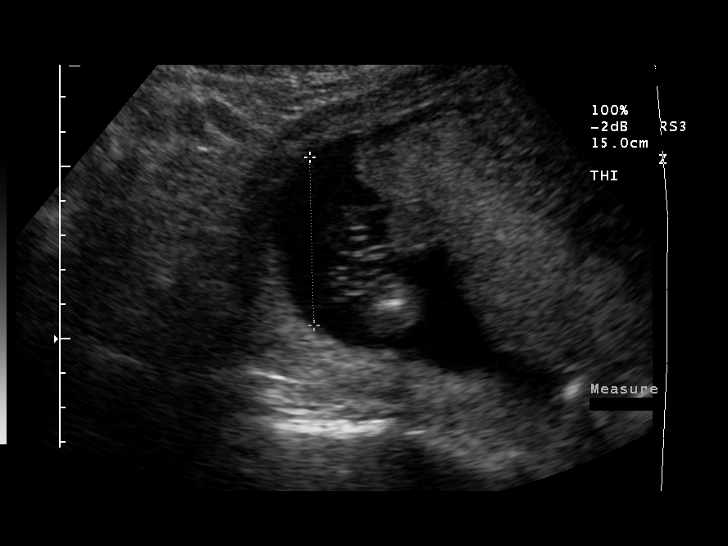
[im 40/50]
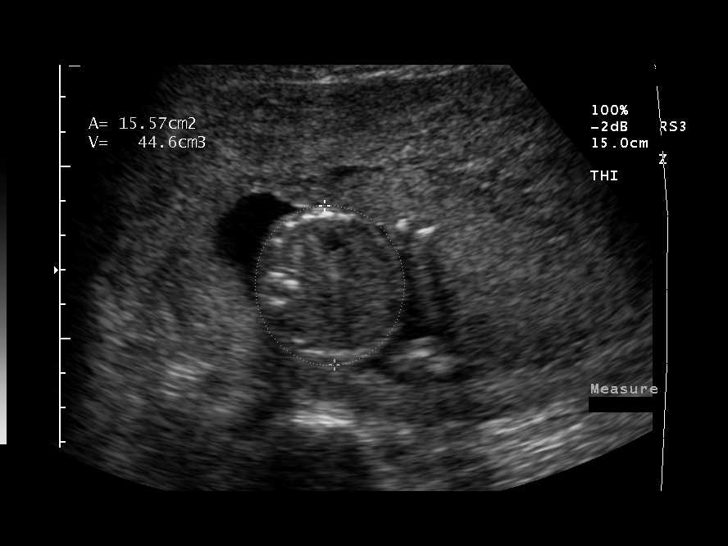
[im 44/50]
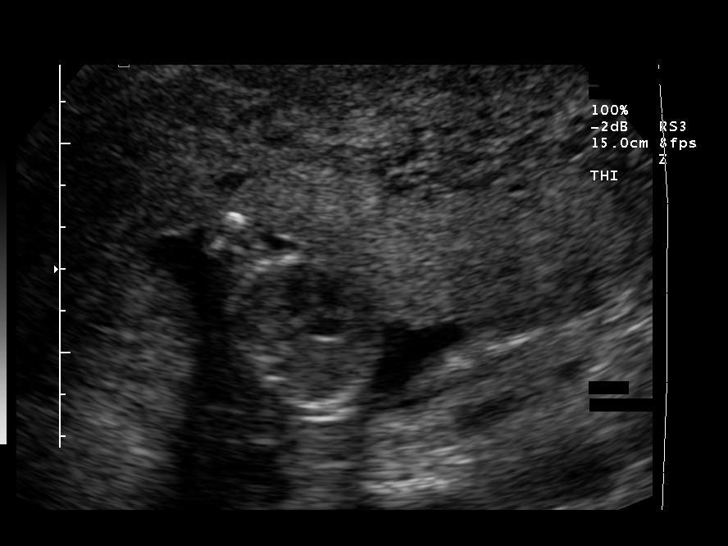
[im 48/50]
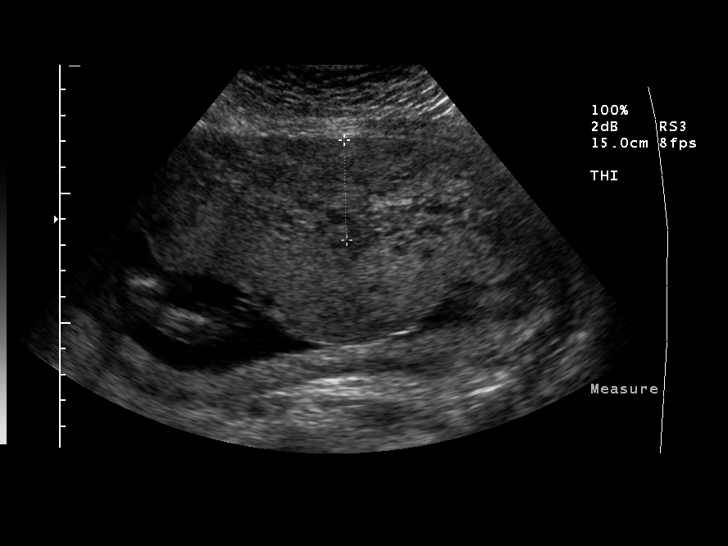

[13 of 28 positions shown; findings below may reference images not displayed]

OBSTETRICAL ULTRASOUND RE-EVALUATION
Number of Fetuses:  1
Heart Rate:  153
Movement:  Yes
Breathing:  No
Presentation:  Cephalic 
Placental Location:  Anterior
Grade:  I
Previa:  No
Amniotic Fluid (subjective):  Normal
Amniotic Fluid (objective):  14.4 cm AFI (5th -95th%ile =   9.3 ? 21.3 cm for 20 wks)

FETAL BIOMETRY
BPD:  4.2 cm   18 w 5 d
HC:  16.1 cm   19 w 0 d
AC:  13.4 cm   18 w 6 d
FL:  3.3 cm   20 w 2 d

Mean GA:  19 w 1 d
Assigned GA:  20 w 2 d

FETAL ANATOMY
Lateral Ventricles:  Visualized 
Thalami/CSP:  Visualized 
Posterior Fossa:  Visualized 
Nuchal Region:  Previously seen 
Spine:  Previously seen 
4 Chamber Heart on Left:  Visualized  
Stomach on Left:  Visualized 
3 Vessel Cord:  Previously seen 
Cord Insertion Site:  Visualized 
Kidneys:  Visualized 
Bladder:  Visualized 
Extremities:  Previously seen 

ADDITIONAL ANATOMY VISUALIZED:  LVOT, diaphragm, and female genitalia

MATERNAL FINDINGS
Cervix:  4.6 cm Transabdominally
Comment:  The anterior myometrium measures 4.4 cm.
IMPRESSION: Single intrauterine pregnancy demonstrating an estimated gestational age by ultrasound of 19 weeks and 1 day.  Correlation with most recent prior ultrasound of [DATE] which would anticipate an estimated gestational age today of 18 weeks and 6 days suggests appropriate interval growth and would mitigate against the presence of early symmetric intrauterine growth restriction.  Also the amniotic fluid volume today appears within normal limits subjectively and measures within normal limits quantitatively as well.
No late developing fetal anatomic abnormalities are identified.
The anterior myometrium remains prominent measuring 4.4 cm in AP width.  This is somewhat inhomogeneous in echotexture and would raise the possibility of underlying adenomyosis in this portion of the myometrium.  The posterior myometrium is thin and has a normal echotexture.  
Normal cervical length.

## 2004-02-21 ENCOUNTER — Inpatient Hospital Stay (HOSPITAL_COMMUNITY): Admission: AD | Admit: 2004-02-21 | Discharge: 2004-02-23 | Payer: Self-pay | Admitting: *Deleted

## 2004-03-30 ENCOUNTER — Ambulatory Visit (HOSPITAL_COMMUNITY): Admission: RE | Admit: 2004-03-30 | Discharge: 2004-03-30 | Payer: Self-pay | Admitting: Obstetrics and Gynecology

## 2005-06-06 ENCOUNTER — Emergency Department (HOSPITAL_COMMUNITY): Admission: EM | Admit: 2005-06-06 | Discharge: 2005-06-06 | Payer: Self-pay | Admitting: Emergency Medicine
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
## 2005-10-10 ENCOUNTER — Emergency Department (HOSPITAL_COMMUNITY): Admission: EM | Admit: 2005-10-10 | Discharge: 2005-10-10 | Payer: Self-pay | Admitting: Family Medicine

## 2005-10-10 IMAGING — CR DG CHEST 2V
2 series · 2 of 2 positions shown · non-contrast
Comparison: None.

CLINICAL DATA: Cough and cold symptoms.
 CHEST ? 2 VIEW:

[view not recorded (1 of 2)]
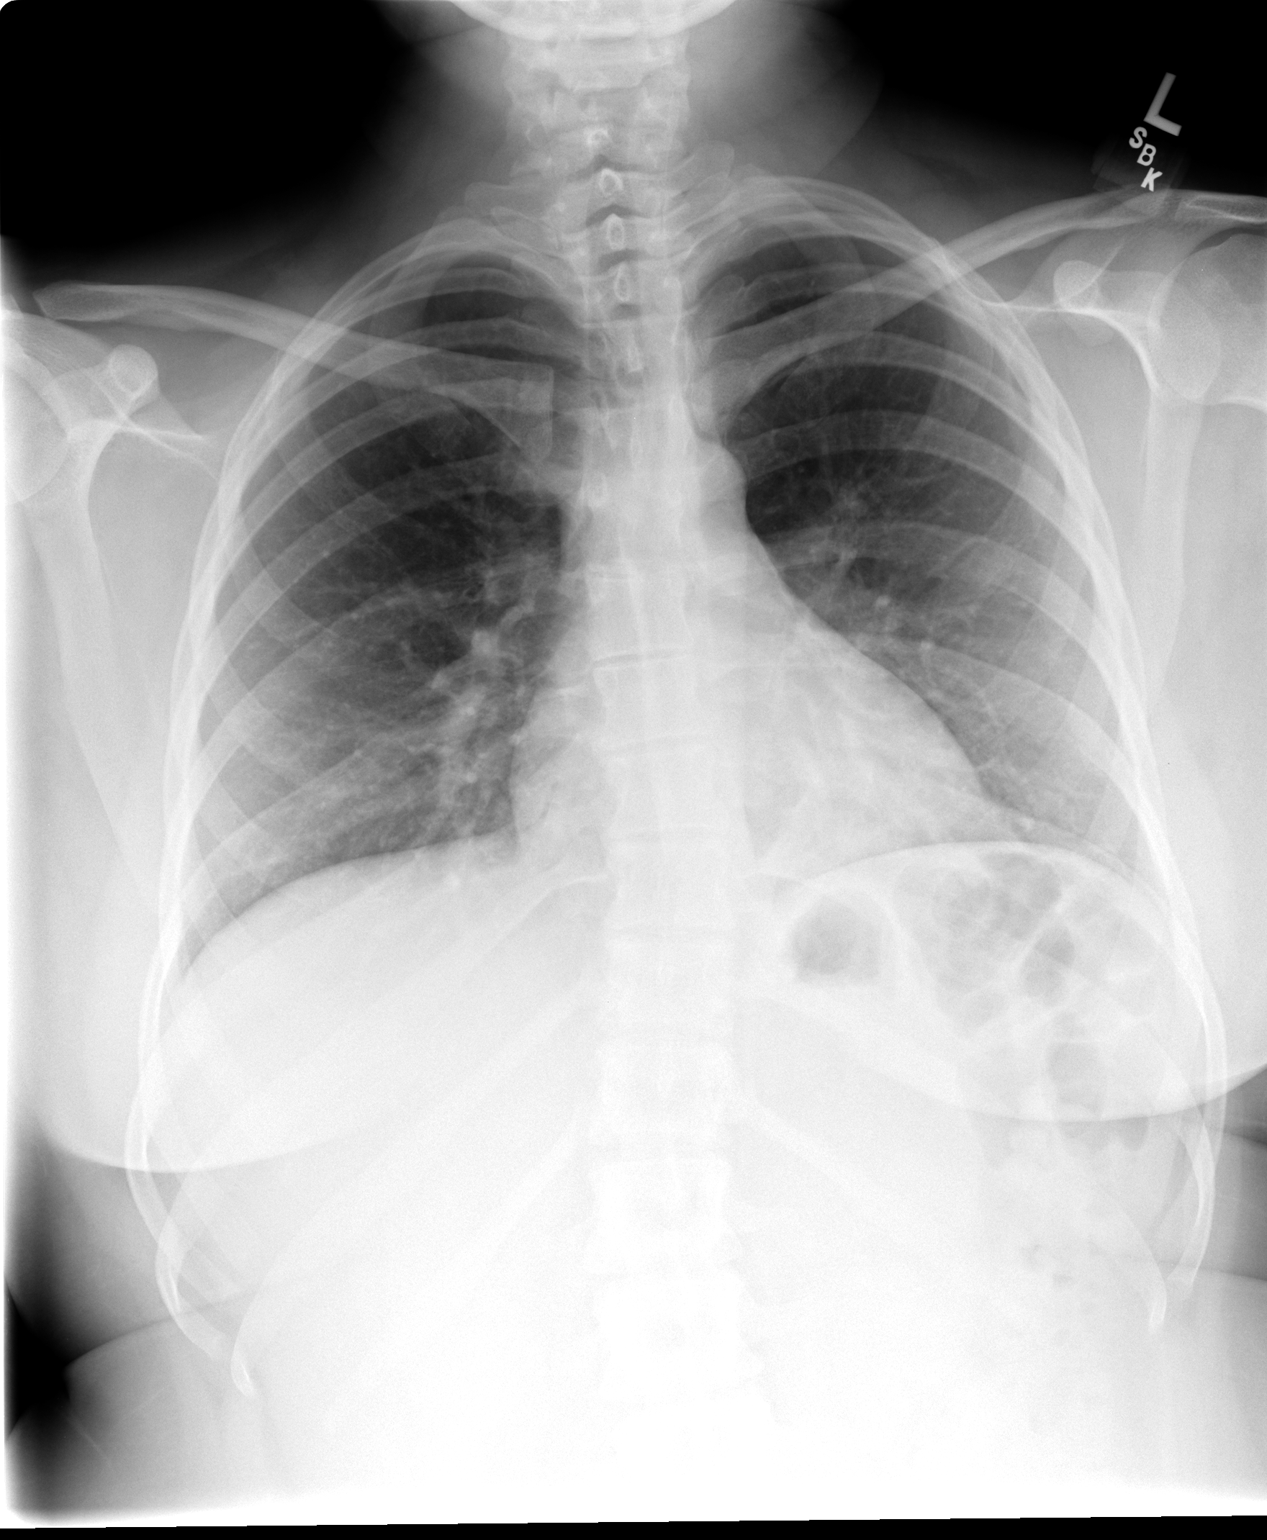

[view not recorded (2 of 2)]
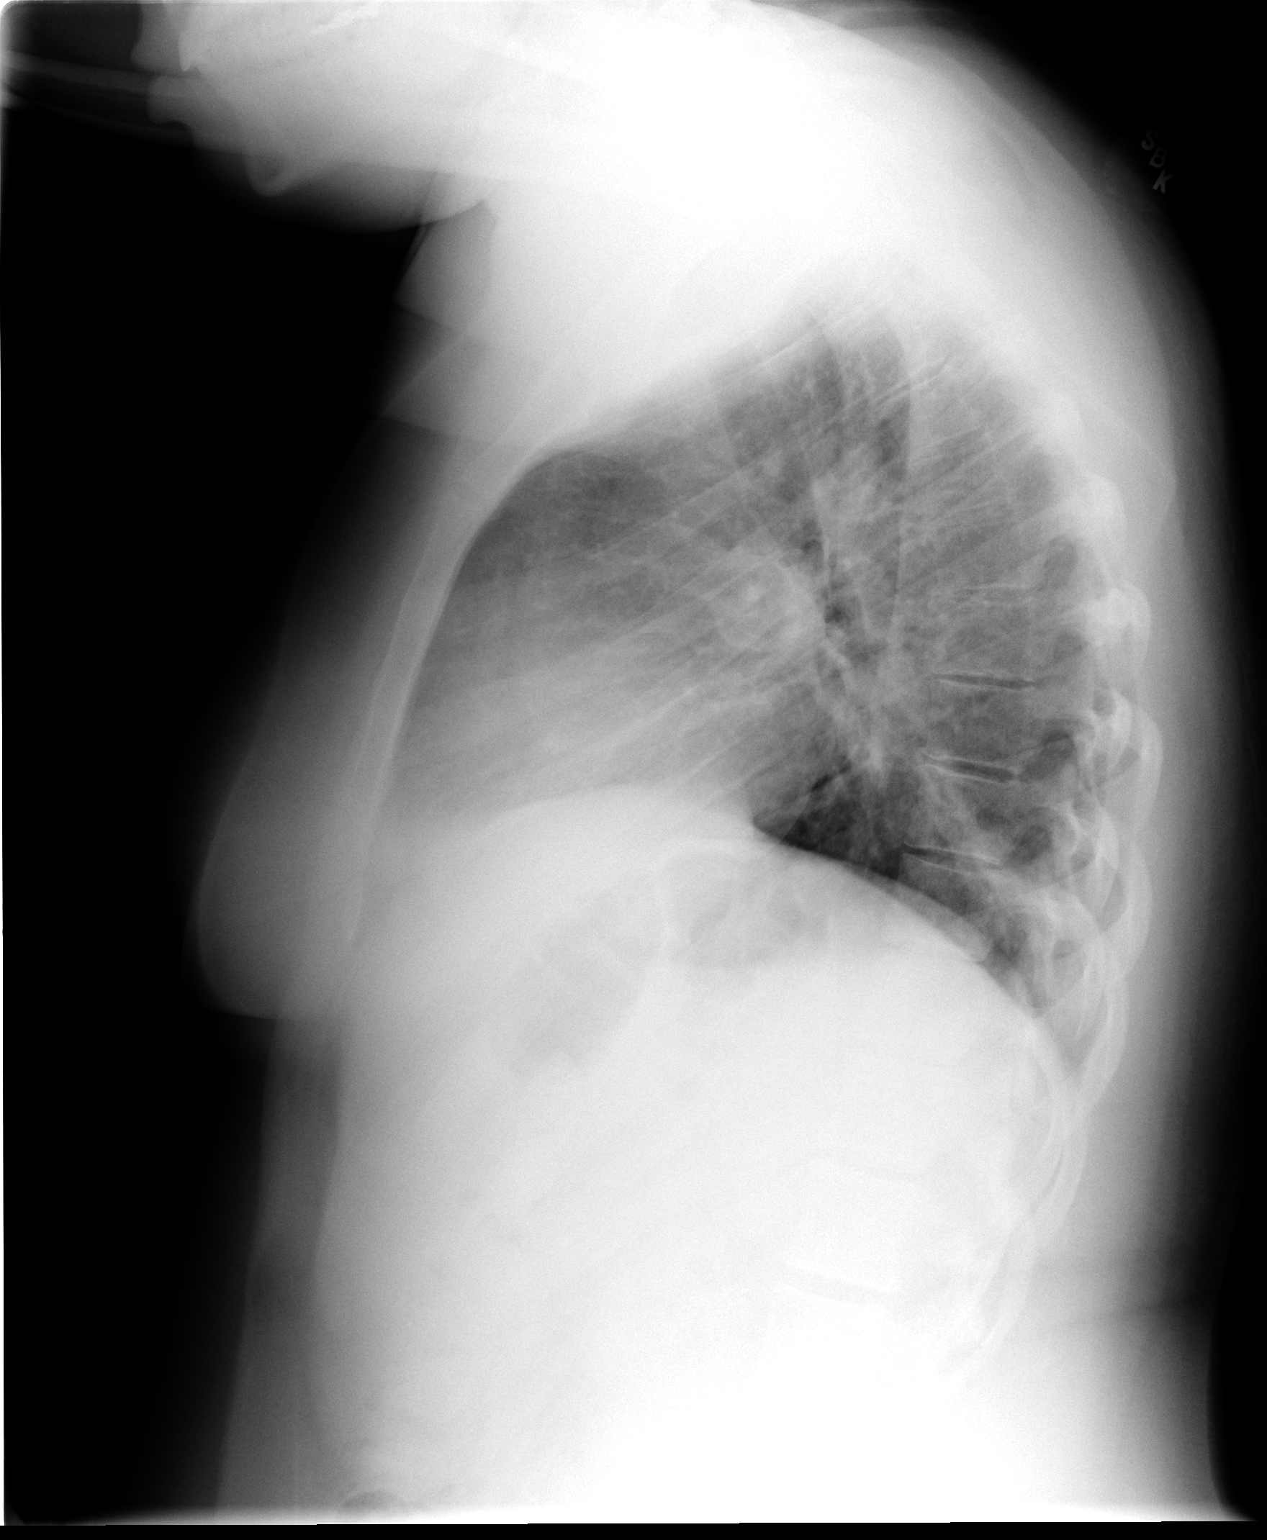

[2 of 2 positions shown; findings below may reference images not displayed]

FINDINGS: Midline trachea.  Heart size normal.  Mediastinal contours unremarkable.  Mild volume loss at the lung bases with minimal subsegmental atelectasis.  Costophrenic angles are sharp.  Lungs are otherwise clear.  There is mild peribronchial thickening which could relate to chronic bronchitis.  Mild left shift spinal curvature.
IMPRESSION: Mild peribronchial thickening with minimal bibasilar atelectasis.  Findings could relate to chronic bronchitis.  No evidence of lobar pneumonia.

## 2005-10-17 ENCOUNTER — Emergency Department (HOSPITAL_COMMUNITY): Admission: EM | Admit: 2005-10-17 | Discharge: 2005-10-17 | Payer: Self-pay | Admitting: Family Medicine

## 2007-03-07 DIAGNOSIS — H5440 Blindness, one eye, unspecified eye: Secondary | ICD-10-CM

## 2007-03-07 HISTORY — DX: Blindness, one eye, unspecified eye: H54.40

## 2008-07-03 ENCOUNTER — Ambulatory Visit (HOSPITAL_COMMUNITY): Admission: RE | Admit: 2008-07-03 | Discharge: 2008-07-03 | Payer: Self-pay | Admitting: Obstetrics and Gynecology

## 2008-07-03 ENCOUNTER — Encounter (INDEPENDENT_AMBULATORY_CARE_PROVIDER_SITE_OTHER): Payer: Self-pay | Admitting: Obstetrics and Gynecology

## 2009-04-13 ENCOUNTER — Emergency Department (HOSPITAL_COMMUNITY): Admission: EM | Admit: 2009-04-13 | Discharge: 2009-04-13 | Payer: Self-pay | Admitting: Emergency Medicine
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
## 2010-05-13 ENCOUNTER — Ambulatory Visit (HOSPITAL_COMMUNITY)
Admission: RE | Admit: 2010-05-13 | Discharge: 2010-05-13 | Disposition: A | Discharge status: Home / Self Care | Payer: Self-pay | Source: Ambulatory Visit | Attending: Orthopaedic Surgery | Admitting: Orthopaedic Surgery

## 2010-06-15 LAB — CBC
MCV: 89.3 fL (ref 78.0–100.0)
Platelets: 180 10*3/uL (ref 150–400)
RBC: 4.46 MIL/uL (ref 3.87–5.11)

## 2010-06-15 LAB — HCG, SERUM, QUALITATIVE: Preg, Serum: NEGATIVE

## 2010-07-19 NOTE — Op Note (Signed)
NAMEMARDELLE, PANDOLFI NO.:  [id number]   MEDICAL RECORD NO.:  [id number]          PATIENT TYPE:  AMB   LOCATION:  SDC                           FACILITY:  [facility]   PHYSICIAN:  Osborn Coho, M.D.   DATE OF BIRTH:  07/13/1975   DATE OF PROCEDURE:  07/03/2008  DATE OF DISCHARGE:                               OPERATIVE REPORT   PREOPERATIVE DIAGNOSES:  1. Menorrhagia.  2. Fibroids.   POSTOPERATIVE DIAGNOSES:  1. Menorrhagia.  2. Fibroids.   PROCEDURE:  1. Hysteroscopy.  2. Dilation and curettage.  3. Hydrothermal ablation.   ATTENDING:  Osborn Coho, MD   ANESTHESIA:  General via LMA.   SPECIMENS TO PATHOLOGY:  Endometrial curettings.   FINDINGS:  Anterior wall submucosal fibroid.   FLUIDS:  500 mL.   URINE OUTPUT:  Quantity sufficient via straight cath prior to procedure.   ESTIMATED BLOOD LOSS:  Minimal.   COMPLICATIONS:  None.   PROCEDURE IN DETAIL:  The patient was taken to the operating room after  risks, benefits, alternatives were discussed with the patient.  The  patient verbalized understand, consent signed and witnessed.  The  patient was placed under general anesthesia and prepped and draped in  the normal sterile fashion.  A bivalve speculum was placed in the  patient's vagina and the anterior lip of the cervix grasped with a  single-tooth tenaculum.  A paracervical block was administered using a  total of 10 mL of 1% lidocaine.  The uterus was sounded to approximately  12 cm and the cervix was already noted to be dilated.  The hysteroscopic  hydrothermal ablation was introduced into the uterine cavity and  submucosal fibroid as noted above.  The hysteroscope was removed and  curettage was performed and curettings sent to Pathology.  The  hysteroscope was reintroduced and ablation performed without difficulty  and good ablation results were noted.  All instruments were removed and  there was good hemostasis at tenaculum site.  In  order to assure that  there was no leak at the cervix while the ablation was  being performed.  A piece of Vaseline gauze was placed at the os and a  piece of gauze was placed in the vagina.  There was good hemostasis at  tenaculum site and count was correct.  The patient tolerated the  procedure well as currently awaiting transfer to the recovery room in  good condition.       Osborn Coho, M.D.  Electronically Signed     AR/MEDQ  D:  07/03/2008  T:  07/04/2008  Job:  [id number]
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
## 2010-07-22 NOTE — H&P (Signed)
Angel Rodriguez, Angel Rodriguez NO.:  [id number]   MEDICAL RECORD NO.:  [id number]          PATIENT TYPE:  INP   LOCATION:  9143                          [facility]ILITY:  [facility]   PHYSICIAN:  Osborn Coho, M.D.   DATE OF BIRTH:  November 11, 1975   DATE OF ADMISSION:  02/21/2004  DATE OF DISCHARGE:                                HISTORY & PHYSICAL   ADDENDUM:   SOCIAL HISTORY:  Angel Rodriguez is a 35 year old single American [place] female.  Her partner Posey Pronto is involved and supportive.  They do not subscribe  to a religious faith.  She denies the use of tobacco, alcohol, or illicit  drugs.   ALLERGIES:  The patient has no known drug allergies.   REVIEW OF SYMPTOMS:  As described above.  The patient is at term and  presented to the hospital in Advanced active labor and delivery  precipitously with C.N.M. in attendance.   PHYSICAL EXAMINATION:  VITAL SIGNS:  Stable.  Afebrile.  HEENT:  Unremarkable.  HEART:  Regular rate and rhythm.  LUNGS:  Clear.  ABDOMEN:  Gravid in its contour.  Uterine fundus is noted on admission to  extend 39 cm above the level of the pubic symphysis.  Fetal heart rate on  admission was 140 with positive variability, positive accelerations.  The  patient was contracting every two to three minutes.  PELVIC:  On exam, the baby's head was crowning and the patient delivered  easily with maternal expulsive effort in two pushes.  The baby was bulb  suction on perineum for clear fluid.  No nuchal cord was present.  There was  no difficulty with delivery the shoulders.  Baby was vigorous and crying at  birth and placed on maternal abdomen.  Cord was clamped and cut by father of  the baby.  Placenta was delivered spontaneously and intact with two large  blood clots following placenta.  Fundal message and IV Pitocin provided  immediate tonic reaction to uterus and uterine fundus remained firm  throughout the remainder of the postpartum period.  Cervix and upper  vagina  were inspected and intact with no lacerations.   DISPOSITION:  Perineum was intact with no lacerations.  Both mother and  infant were stable in the postpartum period.   ASSESSMENT:  1.  Intrauterine pregnancy at term.  2.  Advanced active labor.  3.  Precipitous delivery.  4.  Normal spontaneous vaginal delivery.   PLAN:  Admit per Dr. Osborn Coho with routine C.N.M. orders for delivery  and postpartum.     Pecolia Ades   SDM/MEDQ  D:  02/21/2004  T:  02/21/2004  Job:  [id number]
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
## 2010-07-22 NOTE — Op Note (Signed)
[name], Angel Rodriguez NO.:  [id number]   MEDICAL RECORD NO.:  [id number]          PATIENT TYPE:  AMB   LOCATION:  SDC                           [facility]   PHYSICIAN:  Osborn Coho, M.D.   DATE OF BIRTH:  [birth year]   DATE OF PROCEDURE:  03/30/2004  DATE OF DISCHARGE:  03/30/2004                                 OPERATIVE REPORT   PREOPERATIVE DIAGNOSES:  1.  Multiparity.  2.  Desires permanent sterilization.   POSTOPERATIVE DIAGNOSES:  1.  Multiparity.  2.  Desires permanent sterilization.   PROCEDURE:  Laparoscopic tubal sterilization.   ANESTHESIA:  General.   ESTIMATED BLOOD LOSS:  Minimal.   COMPLICATIONS:  None.   FINDINGS:  Normal-appearing bilateral ovaries, normal-appearing left  fallopian tube, and right fallopian tube with midportion apparently missing.   PROCEDURE:  The patient was taken to the operating room after the risks,  benefits, and alternatives were discussed with the patient.  The patient  verbalized understanding, and consent signed and witnessed.  The patient was  placed under general anesthesia and prepped and draped in the normal sterile  fashion in the dorsal lithotomy position.  After the patient was placed  under general anesthesia, the bivalve speculum was placed in the patient's  vagina, and the Hulka was placed for intrauterine manipulation.  A 10 mm  incision was made at the umbilicus.  The Veress needle was introduced into  the intraabdominal cavity while tenting the abdominal wall.  Pneumoperitoneum was achieved.  The Veress needle was removed, and 10 mm  trocar advanced into the intraabdominal cavity while tenting the abdominal  wall.  The laparoscope was introduced and internal organs visualized.  The  left fallopian tube and ovary appeared within normal limits, and the right  fallopian tube appeared to have a portion missing in the midportion (the  patient had a history of ectopic, although patient reported  being treated  medically).  The right ovary appeared to be within normal limits.  The  bipolar was introduced, and the left fallopian tube and right fallopian tube  were cauterized using the bipolar with three consecutive burns in the  isthmic region.  The area surrounding the missing midportion of the right  fallopian tube were cauterized as well.  Pneumoperitoneum was relieved.  Trocar was  removed under direct visualization.  The fascia was repaired with 0 Vicryl  in a running fashion.  The skin was closed using a subcuticular stitch of 3-  0 Monocryl.  Sponge, lap, and needle count was correct.  The patient  tolerated the procedure well and was extubated and returned to the recovery  room in good condition.      AR/MEDQ  D:  04/04/2004  T:  04/04/2004  Job:  [id number]

## 2010-09-24 ENCOUNTER — Inpatient Hospital Stay (INDEPENDENT_AMBULATORY_CARE_PROVIDER_SITE_OTHER)
Admission: RE | Admit: 2010-09-24 | Discharge: 2010-09-24 | Disposition: A | Discharge status: Home / Self Care | Payer: 59 | Source: Ambulatory Visit | Attending: Family Medicine | Admitting: Family Medicine

## 2010-09-24 DIAGNOSIS — A499 Bacterial infection, unspecified: Secondary | ICD-10-CM

## 2010-09-24 DIAGNOSIS — B373 Candidiasis of vulva and vagina: Secondary | ICD-10-CM

## 2010-09-24 DIAGNOSIS — N760 Acute vaginitis: Secondary | ICD-10-CM

## 2010-09-24 LAB — WET PREP, GENITAL
Trich, Wet Prep: NONE SEEN
Yeast Wet Prep HPF POC: NONE SEEN

## 2010-09-24 LAB — POCT URINALYSIS DIP (DEVICE)
Glucose, UA: NEGATIVE mg/dL
Ketones, ur: NEGATIVE mg/dL
Leukocytes, UA: NEGATIVE
Nitrite: NEGATIVE

## 2010-09-26 LAB — GC/CHLAMYDIA PROBE AMP, GENITAL
Chlamydia, DNA Probe: NEGATIVE
GC Probe Amp, Genital: NEGATIVE

## 2011-05-19 ENCOUNTER — Ambulatory Visit (INDEPENDENT_AMBULATORY_CARE_PROVIDER_SITE_OTHER): Payer: 59 | Admitting: Obstetrics and Gynecology

## 2011-05-19 DIAGNOSIS — Z3040 Encounter for surveillance of contraceptives, unspecified: Secondary | ICD-10-CM

## 2011-05-19 DIAGNOSIS — Z01419 Encounter for gynecological examination (general) (routine) without abnormal findings: Secondary | ICD-10-CM

## 2011-05-19 DIAGNOSIS — Z202 Contact with and (suspected) exposure to infections with a predominantly sexual mode of transmission: Secondary | ICD-10-CM

## 2011-05-19 DIAGNOSIS — N390 Urinary tract infection, site not specified: Secondary | ICD-10-CM

## 2011-05-19 DIAGNOSIS — Z113 Encounter for screening for infections with a predominantly sexual mode of transmission: Secondary | ICD-10-CM

## 2011-05-19 DIAGNOSIS — R350 Frequency of micturition: Secondary | ICD-10-CM

## 2011-06-16 ENCOUNTER — Emergency Department (INDEPENDENT_AMBULATORY_CARE_PROVIDER_SITE_OTHER)
Admission: EM | Admit: 2011-06-16 | Discharge: 2011-06-16 | Disposition: A | Discharge status: Home / Self Care | Payer: 59 | Source: Home / Self Care | Attending: Family Medicine | Admitting: Family Medicine

## 2011-06-16 ENCOUNTER — Encounter (HOSPITAL_COMMUNITY): Payer: Self-pay

## 2011-06-16 DIAGNOSIS — L0291 Cutaneous abscess, unspecified: Secondary | ICD-10-CM

## 2011-06-16 DIAGNOSIS — IMO0002 Reserved for concepts with insufficient information to code with codable children: Secondary | ICD-10-CM

## 2011-06-16 MED ORDER — DOXYCYCLINE HYCLATE 100 MG PO CAPS: 100.0000 mg | ORAL_CAPSULE | Freq: Two times a day (BID) | ORAL | Status: AC

## 2011-06-16 MED ORDER — IBUPROFEN 600 MG PO TABS: 600.0000 mg | ORAL_TABLET | Freq: Three times a day (TID) | ORAL | Status: AC

## 2011-06-16 MED ORDER — PREDNISONE 20 MG PO TABS: ORAL_TABLET | ORAL | Status: AC

## 2011-06-16 NOTE — ED Notes (Signed)
Concern for lesion of right shoulder

## 2011-06-16 NOTE — ED Provider Notes (Signed)
History     CSN: [id number]  Arrival date & time 06/16/11  1904   First MD Initiated Contact with Patient 06/16/11 1913      Chief Complaint  Patient presents with  . Skin Ulcer    (Consider location/radiation/quality/duration/timing/severity/associated sxs/prior treatment) HPI Comments: 8 y/ female non diabetic and denies known PMH. here c/o tenderness and swelling in right upper back close to shoulder after being bit by a "spider" 2 days ago. Reports that she was able to see the spider but did not trapped it. Area has become progressively tender. No fever or chills, no malaise or headache. No N/V/D. Denies any other symptoms.    History reviewed. No pertinent past medical history.  History reviewed. No pertinent past surgical history.  History reviewed. No pertinent family history.  History  Substance Use Topics  . Smoking status: Not on file  . Smokeless tobacco: Not on file  . Alcohol Use: Not on file    OB History    Grav Para Term Preterm Abortions TAB SAB Ect Mult Living                  Review of Systems  Constitutional: Negative for fever and chills.  Skin:       As per HPI    Allergies  Review of patient's allergies indicates no known allergies.  Home Medications   Current Outpatient Rx  Name Route Sig Dispense Refill  . DOXYCYCLINE HYCLATE 100 MG PO CAPS Oral Take 1 capsule (100 mg total) by mouth 2 (two) times daily. 14 capsule 0  . IBUPROFEN 600 MG PO TABS Oral Take 1 tablet (600 mg total) by mouth 3 (three) times daily. 20 tablet 0    Take with food  . PREDNISONE 20 MG PO TABS  2 tabs po daily for 5 days 10 tablet 0    There were no vitals taken for this visit.  Physical Exam  Nursing note and vitals reviewed. Constitutional: She is oriented to person, place, and time. She appears well-developed and well-nourished. No distress.  HENT:  Head: Normocephalic and atraumatic.  Eyes: Conjunctivae are normal.  Neck: Neck supple.    Cardiovascular: Normal heart sounds.   Pulmonary/Chest: Breath sounds normal.  Lymphadenopathy:    She has no cervical adenopathy.  Neurological: She is alert and oriented to person, place, and time.  Skin:       About 2x3 cm area of erythema, tenderness and induration. There is a central pustule with yellow tip. No significant fluctuation. No striking erythema but border are diffused.     ED Course  INCISION AND DRAINAGE Performed by: Sharin Grave Authorized by: Sharin Grave Consent: Verbal consent obtained. Risks and benefits: risks, benefits and alternatives were discussed Consent given by: patient Patient understanding: patient states understanding of the procedure being performed Patient consent: the patient's understanding of the procedure matches consent given Type: abscess Body area: upper extremity Location details: right shoulder Anesthesia: local infiltration Local anesthetic: lidocaine 1% with epinephrine Needle gauge: 18 Complexity: simple Drainage: purulent Drainage amount: scant Patient tolerance: Patient tolerated the procedure well with no immediate complications.   (including critical care time)  Labs Reviewed - No data to display No results found.   1. Abscess       MDM  Small pustule/abcess lession with focal cellulitis around. Unroofed and drained. Treated with doxycycline and prednisone.  Asked to return if worsening symptoms despite following treatment.        Sharin Grave, MD  06/18/11 1152 

## 2011-06-16 NOTE — Discharge Instructions (Signed)
Keep area clean and dry. Can clean with soap and water. Take the prescribed medications as instructed. Return if worsening redness, swelling or drainage after following treatment for at least 24 hours.   Abscess An abscess (boil or furuncle) is an infected area that contains a collection of pus.  SYMPTOMS Signs and symptoms of an abscess include pain, tenderness, redness, or hardness. You may feel a moveable soft area under your skin. An abscess can occur anywhere in the body.  TREATMENT  A surgical cut (incision) may be made over your abscess to drain the pus. Gauze may be packed into the space or a drain may be looped through the abscess cavity (pocket). This provides a drain that will allow the cavity to heal from the inside outwards. The abscess may be painful for a few days, but should feel much better if it was drained.  Your abscess, if seen early, may not have localized and may not have been drained. If not, another appointment may be required if it does not get better on its own or with medications. HOME CARE INSTRUCTIONS   Only take over-the-counter or prescription medicines for pain, discomfort, or fever as directed by your caregiver.   Take your antibiotics as directed if they were prescribed. Finish them even if you start to feel better.   Keep the skin and clothes clean around your abscess.   If the abscess was drained, you will need to use gauze dressing to collect any draining pus. Dressings will typically need to be changed 3 or more times a day.   The infection may spread by skin contact with others. Avoid skin contact as much as possible.   Practice good hygiene. This includes regular hand washing, cover any draining skin lesions, and do not share personal care items.   If you participate in sports, do not share athletic equipment, towels, whirlpools, or personal care items. Shower after every practice or tournament.   If a draining area cannot be adequately covered:     Do not participate in sports.   Children should not participate in day care until the wound has healed or drainage stops.   If your caregiver has given you a follow-up appointment, it is very important to keep that appointment. Not keeping the appointment could result in a much worse infection, chronic or permanent injury, pain, and disability. If there is any problem keeping the appointment, you must call back to this facility for assistance.  SEEK MEDICAL CARE IF:   You develop increased pain, swelling, redness, drainage, or bleeding in the wound site.   You develop signs of generalized infection including muscle aches, chills, fever, or a general ill feeling.   You have an oral temperature above 102 F (38.9 C).  MAKE SURE YOU:   Understand these instructions.   Will watch your condition.   Will get help right away if you are not doing well or get worse.  Document Released: 11/30/2004 Document Revised: 02/09/2011 Document Reviewed: 09/24/2007 [facility] Patient Information 2012 [place], [place].

## 2011-12-11 DIAGNOSIS — H348192 Central retinal vein occlusion, unspecified eye, stable: Secondary | ICD-10-CM | POA: Insufficient documentation

## 2011-12-11 DIAGNOSIS — H35359 Cystoid macular degeneration, unspecified eye: Secondary | ICD-10-CM | POA: Insufficient documentation

## 2011-12-11 DIAGNOSIS — H3581 Retinal edema: Secondary | ICD-10-CM | POA: Insufficient documentation

## 2012-05-20 ENCOUNTER — Encounter: Payer: Self-pay | Admitting: Obstetrics and Gynecology

## 2012-05-20 ENCOUNTER — Ambulatory Visit: Payer: 59 | Admitting: Obstetrics and Gynecology

## 2012-05-20 VITALS — BP 110/70 | Temp 98.1°F | Ht 60.0 in | Wt 173.0 lb

## 2012-05-20 DIAGNOSIS — Z113 Encounter for screening for infections with a predominantly sexual mode of transmission: Secondary | ICD-10-CM

## 2012-05-20 DIAGNOSIS — Z124 Encounter for screening for malignant neoplasm of cervix: Secondary | ICD-10-CM

## 2012-05-20 DIAGNOSIS — Z01419 Encounter for gynecological examination (general) (routine) without abnormal findings: Secondary | ICD-10-CM

## 2012-05-20 DIAGNOSIS — B379 Candidiasis, unspecified: Secondary | ICD-10-CM | POA: Insufficient documentation

## 2012-05-20 DIAGNOSIS — D219 Benign neoplasm of connective and other soft tissue, unspecified: Secondary | ICD-10-CM | POA: Insufficient documentation

## 2012-05-20 DIAGNOSIS — A599 Trichomoniasis, unspecified: Secondary | ICD-10-CM | POA: Insufficient documentation

## 2012-05-20 NOTE — Progress Notes (Signed)
Regular Periods: yes Mammogram: no  Monthly Breast Ex.: no Exercise: no  Tetanus < 10 years: no Seatbelts: yes  NI. Bladder Functn.: yes Abuse at home: no  Daily BM's: yes Stressful Work: no  Healthy Diet: yes Sigmoid-Colonoscopy: n/a  Calcium: no Medical problems this year: none   LAST PAP:05/19/2011  Contraception: BTL  Mammogram:  n/a  PCP: Dr. Scharlene Gloss  PMH: None  FMH: None  Last Bone Scan:  None

## 2012-05-20 NOTE — Progress Notes (Signed)
Subjective:    Angel Rodriguez is a 37 y.o. female, G4P3, who presents for an annual exam. The patient reports no complaints but wants to be checked for STDs.  Menstrual cycle:   LMP: Patient's last menstrual period was 04/17/2012.  Flow 3 days pad change every 2 hours; cramping rated as 10/10 on 10 point pain scale but no cramps with Ibuprofen 800 mg             Review of Systems Pertinent items are noted in HPI. Denies pelvic pain, urinary tract symptoms, vaginitis symptoms, irregular bleeding, menopausal symptoms, change in bowel habits or rectal bleeding   Objective:    BP 110/70  Temp(Src) 98.1 F (36.7 C) (Oral)  Ht 5' (1.524 m)  Wt 173 lb (78.472 kg)  BMI 33.79 kg/m2  LMP 04/17/2012    Wt Readings from Last 1 Encounters:  05/20/12 173 lb (78.472 kg)   Body mass index is 33.79 kg/(m^2). General Appearance: Alert, no acute distress HEENT: Grossly normal Neck / Thyroid: Supple, no thyromegaly or cervical adenopathy Lungs: Clear to auscultation bilaterally Back: No CVA tenderness Breast Exam: No masses or nodes.No dimpling, nipple retraction or discharge. Cardiovascular: Regular rate and rhythm.  Gastrointestinal: Soft, non-tender, no masses or organomegaly Pelvic Exam: EGBUS-wnl, vagina-normal rugae, cervix- without lesions or tenderness, uterus appears normal size shape and consistency, adnexae-no masses or tenderness Lymphatic Exam: Non-palpable nodes in neck, clavicular,  axillary, or inguinal regions  Skin: no rashes or abnormalities Extremities: no clubbing cyanosis or edema  Neurologic: grossly normal Psychiatric: Alert and oriented    Assessment:   Routine GYN Exam   Plan:  STD testing  PAP sent  RTO 1 year or prn  Shalee Paolo,ELMIRAPA-C

## 2012-11-26 ENCOUNTER — Emergency Department (HOSPITAL_COMMUNITY)
Admission: EM | Admit: 2012-11-26 | Discharge: 2012-11-26 | Disposition: A | Discharge status: Home / Self Care | Payer: 59 | Source: Home / Self Care | Attending: Emergency Medicine | Admitting: Emergency Medicine

## 2012-11-26 ENCOUNTER — Encounter (HOSPITAL_COMMUNITY): Payer: Self-pay | Admitting: *Deleted

## 2012-11-26 ENCOUNTER — Ambulatory Visit (HOSPITAL_COMMUNITY)
Admit: 2012-11-26 | Discharge: 2012-11-26 | Disposition: A | Discharge status: Home / Self Care | Payer: 59 | Attending: Emergency Medicine | Admitting: Emergency Medicine

## 2012-11-26 DIAGNOSIS — M7989 Other specified soft tissue disorders: Secondary | ICD-10-CM | POA: Insufficient documentation

## 2012-11-26 DIAGNOSIS — S93699A Other sprain of unspecified foot, initial encounter: Secondary | ICD-10-CM

## 2012-11-26 DIAGNOSIS — S96811A Strain of other specified muscles and tendons at ankle and foot level, right foot, initial encounter: Secondary | ICD-10-CM

## 2012-11-26 DIAGNOSIS — M79609 Pain in unspecified limb: Secondary | ICD-10-CM

## 2012-11-26 MED ORDER — HYDROCODONE-ACETAMINOPHEN 5-325 MG PO TABS
2.0000 | ORAL_TABLET | Freq: Once | ORAL | Status: AC
  Administered 2012-11-26: 2 via ORAL

## 2012-11-26 MED ORDER — MELOXICAM 15 MG PO TABS: 15.0000 mg | ORAL_TABLET | Freq: Every day | ORAL | Status: DC

## 2012-11-26 MED ORDER — HYDROCODONE-ACETAMINOPHEN 5-325 MG PO TABS: ORAL_TABLET | ORAL | Status: DC

## 2012-11-26 MED ORDER — OXYCODONE-ACETAMINOPHEN 5-325 MG PO TABS: ORAL_TABLET | ORAL | Status: DC

## 2012-11-26 MED ORDER — HYDROCODONE-ACETAMINOPHEN 5-325 MG PO TABS
ORAL_TABLET | ORAL | Status: AC
  Filled 2012-11-26: qty 2

## 2012-11-26 NOTE — ED Notes (Signed)
Pt states that she was going up the stairs on Sunday and heard and felt a "pop" in her right calf.  Pt complains of swelling and pain 10/10 in the right calf.

## 2012-11-26 NOTE — Progress Notes (Signed)
*  Preliminary Results* Right lower extremity venous duplex completed. Right lower extremity is negative for deep vein thrombosis. There is no evidence of right Baker's cyst.  Preliminary results discussed with Gerilyn Nestle from urgent care.  11/26/2012 1:52 PM  Gertie Fey, RVT, RDCS, RDMS

## 2012-11-26 NOTE — ED Notes (Signed)
Pt taken for venous doppler via shuttle.

## 2012-11-26 NOTE — ED Provider Notes (Signed)
Chief Complaint:   Chief Complaint  Patient presents with  . Leg Pain    Right calf    History of Present Illness:   Angel Rodriguez is a 37 year old female who was going down some steps 3 days ago when she turned and felt a pop followed by sudden pain in the upper calf. Thereafter she developed swelling and bruising in the upper calf. She denies any pain or pain in the Achilles tendon or the ankle. She has difficulty bearing weight on her leg and dorsiflexing the ankle. She denies any numbness or tingling.  Review of Systems:  Other than noted above, the patient denies any of the following symptoms: Systemic:  No fevers, chills, sweats, or aches.  No fatigue or tiredness. Musculoskeletal:  No joint pain, arthritis, bursitis, swelling, back pain, or neck pain. Neurological:  No muscular weakness, paresthesias, headache, or trouble with speech or coordination.  No dizziness.  PMFSH:  Past medical history, family history, social history, meds, and allergies were reviewed.   Physical Exam:   Vital signs:  BP 109/70  Pulse 85  Temp(Src) 97.9 F (36.6 C) (Oral)  Resp 16  SpO2 99%  LMP 11/18/2012 Gen:  Alert and oriented times 3.  In no distress. Musculoskeletal: There is swelling, bruising, and pain to palpation in the proximal portion of the gastrocnemius. The Achille and was intact per Thompson's squeeze test. There was no pain in the ankle joint or the foot and. She did have pain on dorsiflexion of the ankle.  Otherwise, all joints had a full a ROM with no swelling, bruising or deformity.  No edema, pulses full. Extremities were warm and pink.  Capillary refill was brisk.  Skin:  Clear, warm and dry.  No rash. Neuro:  Alert and oriented times 3.  Muscle strength was normal.  Sensation was intact to light touch.   Radiology:  A venous duplex was negative for DVT.  Course in Urgent Care Center:    Given Norco 5/325, 2 tablets for pain.   Assessment:  The encounter diagnosis was Rupture of  plantaris tendon, right, initial encounter.  No evidence for DVT or ruptured Achilles tendon.  Plan:   1.  Meds:  The following meds were prescribed:   Discharge Medication List as of 11/26/2012  2:18 PM    START taking these medications   Details  HYDROcodone-acetaminophen (NORCO/VICODIN) 5-325 MG per tablet 1 to 2 tabs every 4 to 6 hours as needed for pain., Print        2.  Patient Education/Counseling:  The patient was given appropriate handouts, self care instructions, and instructed in symptomatic relief, including rest and activity, elevation, application of ice and compression.  Given crutches, the leg was wrapped with Ace wrap, should elevate, and rest.  3.  Follow up:  The patient was told to follow up if no better in 3 to 4 days, if becoming worse in any way, and given some red flag symptoms such as worsening pain or new neurological symptoms which would prompt immediate return.  Follow up with Dr. Jodi Geralds in one week.     Reuben Likes, MD 11/26/12 2224

## 2013-01-04 ENCOUNTER — Encounter (HOSPITAL_COMMUNITY): Payer: Self-pay | Admitting: Emergency Medicine

## 2013-01-04 ENCOUNTER — Emergency Department (HOSPITAL_COMMUNITY)
Admission: EM | Admit: 2013-01-04 | Discharge: 2013-01-04 | Disposition: A | Discharge status: Home / Self Care | Payer: 59 | Source: Home / Self Care

## 2013-01-04 DIAGNOSIS — J40 Bronchitis, not specified as acute or chronic: Secondary | ICD-10-CM

## 2013-01-04 DIAGNOSIS — R05 Cough: Secondary | ICD-10-CM

## 2013-01-04 DIAGNOSIS — R509 Fever, unspecified: Secondary | ICD-10-CM

## 2013-01-04 MED ORDER — HYDROCODONE-HOMATROPINE 5-1.5 MG/5ML PO SYRP: 5.0000 mL | ORAL_SOLUTION | Freq: Three times a day (TID) | ORAL | Status: DC | PRN

## 2013-01-04 MED ORDER — METHYLPREDNISOLONE SODIUM SUCC 125 MG IJ SOLR
INTRAMUSCULAR | Status: AC
  Filled 2013-01-04: qty 2

## 2013-01-04 MED ORDER — METHYLPREDNISOLONE SODIUM SUCC 125 MG IJ SOLR
125.0000 mg | Freq: Once | INTRAMUSCULAR | Status: AC
  Administered 2013-01-04: 125 mg via INTRAMUSCULAR

## 2013-01-04 MED ORDER — AZITHROMYCIN 250 MG PO TABS: ORAL_TABLET | ORAL | Status: DC

## 2013-01-04 NOTE — ED Notes (Signed)
C/O productive cough x 8 days.  Feels feverish, poor appetite, some diarrhea, generalized body aches, fatigue.  Has been taking OTC cough med without relief.

## 2013-01-04 NOTE — ED Provider Notes (Signed)
CSN: [id number]     Arrival date & time 01/04/13  1403 History   None    Chief Complaint  Patient presents with  . Cough  . Generalized Body Aches   (Consider location/radiation/quality/duration/timing/severity/associated sxs/prior Treatment) HPI Comments: Mrs. Onstad reports a 8-9 day history of what began as a sore throat and nasal congestion and now has "settled" in the lungs. Reports continued productive (green-yellow) cough that is worsening. Mild subjective fever and chills, with malaise. [facility] and ruled out for strep and flu and was not given abx at that time.   Patient is a 37 y.o. female presenting with cough. The history is provided by the patient.  Cough   Past Medical History  Diagnosis Date  . BV (bacterial vaginosis)   . Fibroids   . Trichomonas   . Menorrhagia   . Dysmenorrhea   . Yeast infection   . Pneumonia   . Seasonal allergies    Past Surgical History  Procedure Laterality Date  . Hysteroscopy w/d&c    . Ablation    . Tubal ligation    . Wisdom tooth extraction    . Eye surgery     Family History  Problem Relation Age of Onset  . Cancer Maternal Grandmother     unsure of type   History  Substance Use Topics  . Smoking status: Current Some Day Smoker  . Smokeless tobacco: Never Used  . Alcohol Use: Yes     Comment: occasional   OB History   Grav Para Term Preterm Abortions TAB SAB Ect Mult Living   4 3        3      Review of Systems  Respiratory: Positive for cough.   All other systems reviewed and are negative.    Allergies  Ivp dye  Home Medications   Current Outpatient Rx  Name  Route  Sig  Dispense  Refill  . Multiple Vitamin (MULTIVITAMIN) tablet   Oral   Take 1 tablet by mouth daily.         [name] azithromycin (ZITHROMAX Z-PAK) 250 MG tablet      2 tablets day 1, then 1 tablet po q day until complete   6 tablet   0   . Cyanocobalamin (VITAMIN B-12 PO)   Oral   Take by mouth.          [name] HYDROcodone-acetaminophen (NORCO/VICODIN) 5-325 MG per tablet      1 to 2 tabs every 4 to 6 hours as needed for pain.   20 tablet   0   . HYDROcodone-homatropine (HYCODAN) 5-1.5 MG/5ML syrup   Oral   Take 5 mLs by mouth every 8 (eight) hours as needed for cough.   120 mL   0   . meloxicam (MOBIC) 15 MG tablet   Oral   Take 1 tablet (15 mg total) by mouth daily.   15 tablet   0    BP 108/74  Pulse 93  Temp(Src) 99.8 F (37.7 C) (Oral)  Resp 20  SpO2 97%  LMP 12/18/2012 Physical Exam  Nursing note and vitals reviewed. Constitutional: She is oriented to person, place, and time. She appears well-developed and well-nourished. No distress.  HENT:  Head: Normocephalic and atraumatic.  Right Ear: External ear normal.  Left Ear: External ear normal.  Mouth/Throat: No oropharyngeal exudate.  Eyes: Pupils are equal, round, and reactive to light. Right eye exhibits no discharge. Left eye exhibits  no discharge.  Neck: Normal range of motion.  Cardiovascular: Normal rate, regular rhythm and normal heart sounds.  Exam reveals no gallop and no friction rub.   No murmur heard. Pulmonary/Chest: Effort normal. No respiratory distress. She has wheezes. She has no rales.  Scattered wheezes in the bases, no crackles and a few course BS bilaterally.  Lymphadenopathy:    She has no cervical adenopathy.  Neurological: She is alert and oriented to person, place, and time.  Skin: Skin is warm and dry.  Psychiatric: Her behavior is normal.    ED Course  Procedures (including critical care time) Labs Review Labs Reviewed - No data to display Imaging Review No results found.  EKG Interpretation     Ventricular Rate:    PR Interval:    QRS Duration:   QT Interval:    QTC Calculation:   R Axis:     Text Interpretation:              MDM   1. Bronchitis   2. Cough   3. Fever   Given duration treat with ABX/Steroid injection and cough night time medication. F/U if  worsens.     Riki Sheer, PA-C 01/04/13 [phone]

## 2013-01-04 NOTE — ED Provider Notes (Signed)
Medical screening examination/treatment/procedure(s) were performed by non-physician practitioner and as supervising physician I was immediately available for consultation/collaboration.  Jayvian Escoe, M.D.  Walt Geathers C Saydie Gerdts, MD 01/04/13 1820 

## 2013-01-09 ENCOUNTER — Other Ambulatory Visit: Payer: Self-pay

## 2013-11-23 ENCOUNTER — Emergency Department (INDEPENDENT_AMBULATORY_CARE_PROVIDER_SITE_OTHER)
Admission: EM | Admit: 2013-11-23 | Discharge: 2013-11-23 | Disposition: A | Discharge status: Home / Self Care | Payer: Self-pay | Source: Home / Self Care

## 2013-11-23 ENCOUNTER — Encounter (HOSPITAL_COMMUNITY): Payer: Self-pay | Admitting: Emergency Medicine

## 2013-11-23 DIAGNOSIS — J3089 Other allergic rhinitis: Secondary | ICD-10-CM

## 2013-11-23 LAB — POCT RAPID STREP A: Streptococcus, Group A Screen (Direct): NEGATIVE

## 2013-11-23 NOTE — ED Provider Notes (Signed)
CSN: [id number]     Arrival date & time 11/23/13  1200 History   First MD Initiated Contact with Patient 11/23/13 1249     Chief Complaint  Patient presents with  . Sore Throat   (Consider location/radiation/quality/duration/timing/severity/associated sxs/prior Treatment) HPI Comments: Runny nose, sore throat and sniffles for 2 d.   Past Medical History  Diagnosis Date  . BV (bacterial vaginosis)   . Fibroids   . Trichomonas   . Menorrhagia   . Dysmenorrhea   . Yeast infection   . Pneumonia   . Seasonal allergies    Past Surgical History  Procedure Laterality Date  . Hysteroscopy w/d&c    . Ablation    . Tubal ligation    . Wisdom tooth extraction    . Eye surgery     Family History  Problem Relation Age of Onset  . Cancer Maternal Grandmother     unsure of type   History  Substance Use Topics  . Smoking status: Current Some Day Smoker  . Smokeless tobacco: Never Used  . Alcohol Use: Yes     Comment: occasional   OB History   Grav Para Term Preterm Abortions TAB SAB Ect Mult Living   4 3        3      Review of Systems  Constitutional: Negative for fever, chills, activity change, appetite change and fatigue.  HENT: Positive for congestion, postnasal drip, rhinorrhea and sore throat. Negative for facial swelling.   Eyes: Negative.   Respiratory: Negative.   Cardiovascular: Negative.   Musculoskeletal: Negative for neck pain and neck stiffness.  Skin: Negative for pallor and rash.  Neurological: Negative.     Allergies  Ivp dye  Home Medications   Prior to Admission medications   Medication Sig Start Date End Date Taking? Authorizing Provider  Cyanocobalamin (VITAMIN B-12 PO) Take by mouth.    Historical Provider, MD   BP 120/79  Pulse 81  Temp(Src) 98 F (36.7 C) (Oral)  Resp 16  SpO2 96% Physical Exam  Nursing note and vitals reviewed. Constitutional: She is oriented to person, place, and time. She appears well-developed and well-nourished. No  distress.  HENT:  Mouth/Throat: No oropharyngeal exudate.  Nl TM's OP with minor erythema and prominent cobblestoning.   Eyes: Conjunctivae and EOM are normal.  Neck: Normal range of motion. Neck supple.  Cardiovascular: Normal rate and regular rhythm.   Pulmonary/Chest: Effort normal and breath sounds normal. No respiratory distress. She has no wheezes.  Musculoskeletal: Normal range of motion. She exhibits no edema.  Lymphadenopathy:    She has no cervical adenopathy.  Neurological: She is alert and oriented to person, place, and time.  Skin: Skin is warm and dry. No rash noted.  Psychiatric: She has a normal mood and affect.    ED Course  Procedures (including critical care time) Labs Review Labs Reviewed  POCT RAPID STREP A ([place])   Results for orders placed during the hospital encounter of 11/23/13  POCT RAPID STREP A ([facility] ONLY)      Result Value Ref Range   Streptococcus, Group A Screen (Direct) NEGATIVE  NEGATIVE    Imaging Review No results found.   MDM   1. Other allergic rhinitis     Flonase nasal spray Saline nasal spray Allegra or claritin for drainage Sudafed PE 10 mg for congestion     Janne Napoleon, NP 11/23/13 1307

## 2013-11-23 NOTE — Discharge Instructions (Signed)
Allergic Rhinitis Flonase nasal spray Saline nasal spray Allegra or claritin for drainage Sudafed PE 10 mg for congestion Allergic rhinitis is when the mucous membranes in the nose respond to allergens. Allergens are particles in the air that cause your body to have an allergic reaction. This causes you to release allergic antibodies. Through a chain of events, these eventually cause you to release histamine into the blood stream. Although meant to protect the body, it is this release of histamine that causes your discomfort, such as frequent sneezing, congestion, and an itchy, runny nose.  CAUSES  Seasonal allergic rhinitis (hay fever) is caused by pollen allergens that may come from grasses, trees, and weeds. Year-round allergic rhinitis (perennial allergic rhinitis) is caused by allergens such as house dust mites, pet dander, and mold spores.  SYMPTOMS   Nasal stuffiness (congestion).  Itchy, runny nose with sneezing and tearing of the eyes. DIAGNOSIS  Your health care provider can help you determine the allergen or allergens that trigger your symptoms. If you and your health care provider are unable to determine the allergen, skin or blood testing may be used. TREATMENT  Allergic rhinitis does not have a cure, but it can be controlled by:  Medicines and allergy shots (immunotherapy).  Avoiding the allergen. Hay fever may often be treated with antihistamines in pill or nasal spray forms. Antihistamines block the effects of histamine. There are over-the-counter medicines that may help with nasal congestion and swelling around the eyes. Check with your health care provider before taking or giving this medicine.  If avoiding the allergen or the medicine prescribed do not work, there are many new medicines your health care provider can prescribe. Stronger medicine may be used if initial measures are ineffective. Desensitizing injections can be used if medicine and avoidance does not work.  Desensitization is when a patient is given ongoing shots until the body becomes less sensitive to the allergen. Make sure you follow up with your health care provider if problems continue. HOME CARE INSTRUCTIONS It is not possible to completely avoid allergens, but you can reduce your symptoms by taking steps to limit your exposure to them. It helps to know exactly what you are allergic to so that you can avoid your specific triggers. SEEK MEDICAL CARE IF:   You have a fever.  You develop a cough that does not stop easily (persistent).  You have shortness of breath.  You start wheezing.  Symptoms interfere with normal daily activities. Document Released: 11/15/2000 Document Revised: 02/25/2013 Document Reviewed: 10/28/2012 [facility] Patient Information 2015 [place], [place]. This information is not intended to replace advice given to you by your health care provider. Make sure you discuss any questions you have with your health care provider.

## 2013-11-23 NOTE — ED Notes (Addendum)
C/o sore throat and sniffles since yesterday; son has similar syx. NAD

## 2013-11-25 LAB — CULTURE, GROUP A STREP

## 2013-11-25 NOTE — ED Provider Notes (Signed)
Medical screening examination/treatment/procedure(s) were performed by a resident physician or non-physician practitioner and as the supervising physician I was immediately available for consultation/collaboration.  Lynne Leader, MD    Gregor Hams, MD 11/25/13 [phone]

## 2014-01-05 ENCOUNTER — Encounter (HOSPITAL_COMMUNITY): Payer: Self-pay | Admitting: Emergency Medicine

## 2014-05-30 ENCOUNTER — Emergency Department (HOSPITAL_COMMUNITY)
Admission: EM | Admit: 2014-05-30 | Discharge: 2014-05-30 | Disposition: A | Discharge status: Home / Self Care | Payer: Self-pay | Attending: Emergency Medicine | Admitting: Emergency Medicine

## 2014-05-30 ENCOUNTER — Emergency Department (HOSPITAL_COMMUNITY): Payer: Self-pay

## 2014-05-30 ENCOUNTER — Encounter (HOSPITAL_COMMUNITY): Payer: Self-pay | Admitting: Emergency Medicine

## 2014-05-30 DIAGNOSIS — Z720 Tobacco use: Secondary | ICD-10-CM | POA: Insufficient documentation

## 2014-05-30 DIAGNOSIS — Z79899 Other long term (current) drug therapy: Secondary | ICD-10-CM | POA: Insufficient documentation

## 2014-05-30 DIAGNOSIS — Z8742 Personal history of other diseases of the female genital tract: Secondary | ICD-10-CM | POA: Insufficient documentation

## 2014-05-30 DIAGNOSIS — Z8619 Personal history of other infectious and parasitic diseases: Secondary | ICD-10-CM | POA: Insufficient documentation

## 2014-05-30 DIAGNOSIS — R569 Unspecified convulsions: Secondary | ICD-10-CM | POA: Insufficient documentation

## 2014-05-30 DIAGNOSIS — Z3202 Encounter for pregnancy test, result negative: Secondary | ICD-10-CM | POA: Insufficient documentation

## 2014-05-30 DIAGNOSIS — Z8701 Personal history of pneumonia (recurrent): Secondary | ICD-10-CM | POA: Insufficient documentation

## 2014-05-30 DIAGNOSIS — Z7951 Long term (current) use of inhaled steroids: Secondary | ICD-10-CM | POA: Insufficient documentation

## 2014-05-30 LAB — COMPREHENSIVE METABOLIC PANEL
ALT: 31 U/L (ref 0–35)
AST: 40 U/L — ABNORMAL HIGH (ref 0–37)
Albumin: 4 g/dL (ref 3.5–5.2)
Alkaline Phosphatase: 57 U/L (ref 39–117)
Anion gap: 7 (ref 5–15)
BUN: 9 mg/dL (ref 6–23)
CO2: 22 mmol/L (ref 19–32)
Calcium: 9.6 mg/dL (ref 8.4–10.5)
Chloride: 111 mmol/L (ref 96–112)
Creatinine, Ser: 0.74 mg/dL (ref 0.50–1.10)
GFR calc Af Amer: >90 mL/min (ref >90)
GFR calc non Af Amer: >90 mL/min (ref >90)
Glucose, Bld: 113 mg/dL — ABNORMAL HIGH (ref 70–99)
Potassium: 5.1 mmol/L (ref 3.5–5.1)
Sodium: 140 mmol/L (ref 135–145)
Total Bilirubin: 0.6 mg/dL (ref 0.3–1.2)
Total Protein: 7.3 g/dL (ref 6.0–8.3)

## 2014-05-30 LAB — CBC WITH DIFFERENTIAL/PLATELET
Basophils Absolute: 0 10*3/uL (ref 0.0–0.1)
Basophils Relative: 0 % (ref 0–1)
Eosinophils Absolute: 0.1 10*3/uL (ref 0.0–0.7)
Eosinophils Relative: 1 % (ref 0–5)
HCT: 44.2 % (ref 36.0–46.0)
Hemoglobin: 15 g/dL (ref 12.0–15.0)
Lymphocytes Relative: 19 % (ref 12–46)
Lymphs Abs: 2.1 10*3/uL (ref 0.7–4.0)
MCH: 30.4 pg (ref 26.0–34.0)
MCHC: 33.9 g/dL (ref 30.0–36.0)
MCV: 89.5 fL (ref 78.0–100.0)
Monocytes Absolute: 0.5 10*3/uL (ref 0.1–1.0)
Monocytes Relative: 4 % (ref 3–12)
Neutro Abs: 8.6 10*3/uL — ABNORMAL HIGH (ref 1.7–7.7)
Neutrophils Relative %: 76 % (ref 43–77)
Platelets: 99 10*3/uL — ABNORMAL LOW (ref 150–400)
RBC: 4.94 MIL/uL (ref 3.87–5.11)
RDW: 13.2 % (ref 11.5–15.5)
WBC: 11.3 10*3/uL — ABNORMAL HIGH (ref 4.0–10.5)

## 2014-05-30 LAB — RAPID URINE DRUG SCREEN, HOSP PERFORMED
Amphetamines: NOT DETECTED
Barbiturates: NOT DETECTED
Benzodiazepines: NOT DETECTED
Cocaine: NOT DETECTED
Opiates: NOT DETECTED
Tetrahydrocannabinol: NOT DETECTED

## 2014-05-30 LAB — I-STAT BETA HCG BLOOD, ED (MC, WL, AP ONLY): I-stat hCG, quantitative: <5 m[IU]/mL (ref <5)

## 2014-05-30 LAB — ETHANOL: Alcohol, Ethyl (B): <5 mg/dL (ref 0–9)

## 2014-05-30 IMAGING — CT CT HEAD W/O CM
1 series · 16 of 30 positions shown, 20 images · non-contrast
Comparison: [DATE]

CLINICAL DATA: Headache for several days

EXAM:
CT HEAD WITHOUT CONTRAST
TECHNIQUE: Contiguous axial images were obtained from the base of the skull
through the vertex without intravenous contrast.

[Series 2: head 5.0 h30s · axial · 0.47mm/px · z∈[-48,+87]mm · 16 of 31 slices shown, 20 images]
[im 2/31  brain]
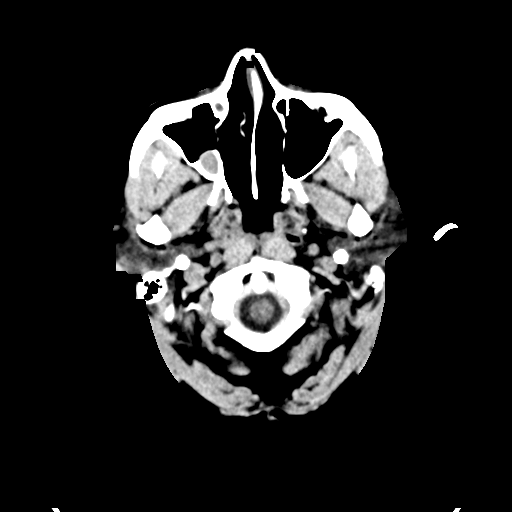
[im 2/31  bone]
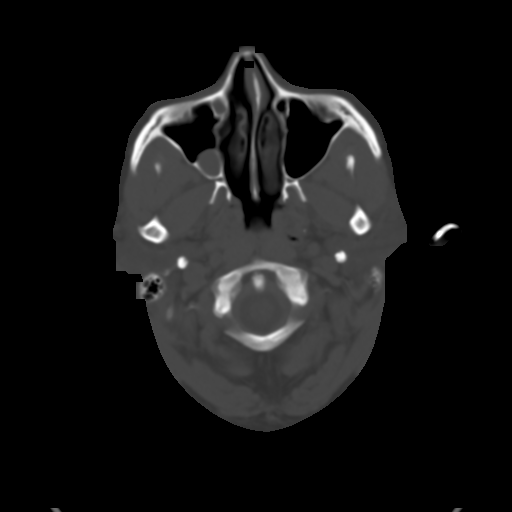
[im 4/31  brain]
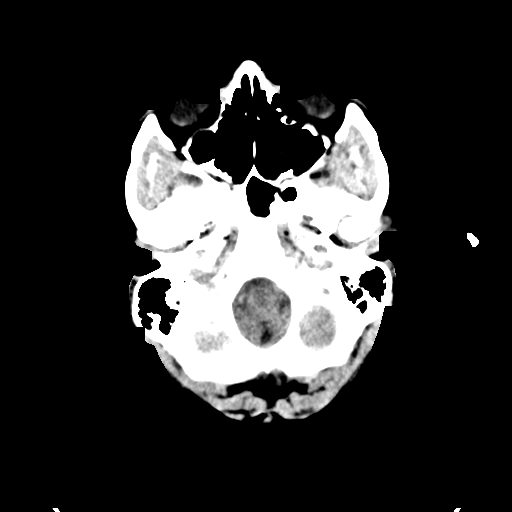
[im 6/31  brain]
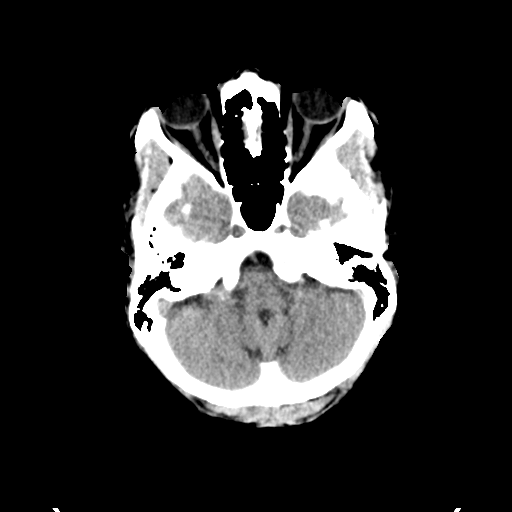
[im 8/31  brain]
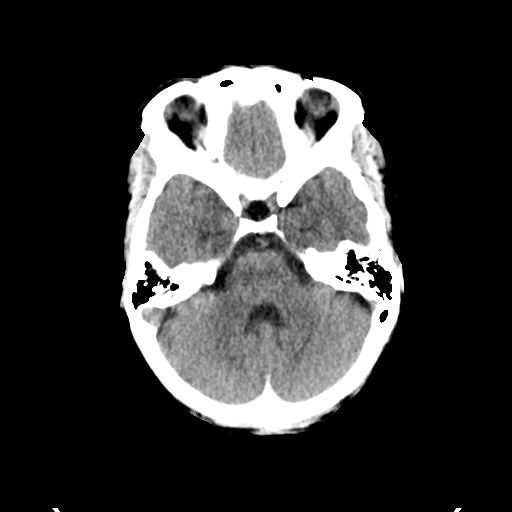
[im 9/31  brain]
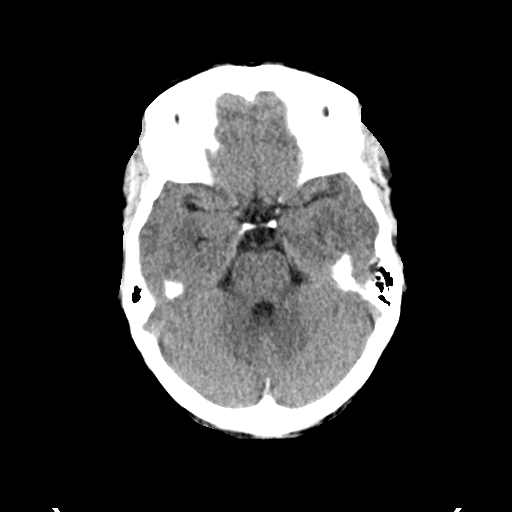
[im 9/31  bone]
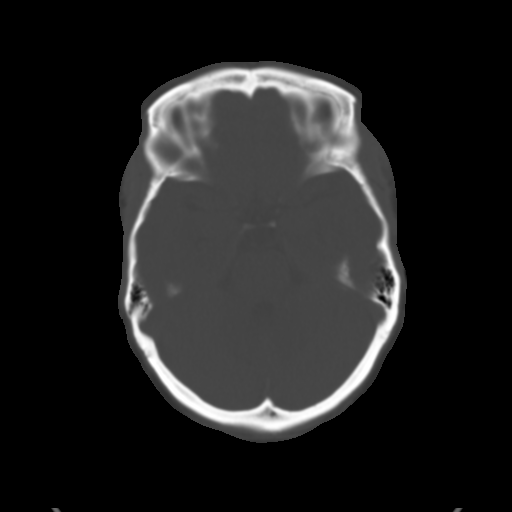
[im 11/31  brain]
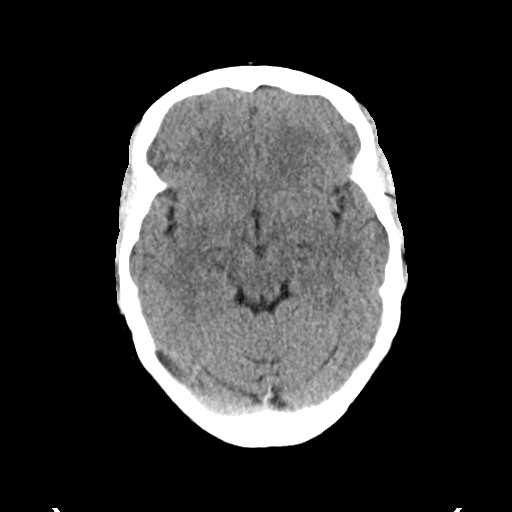
[im 13/31  brain]
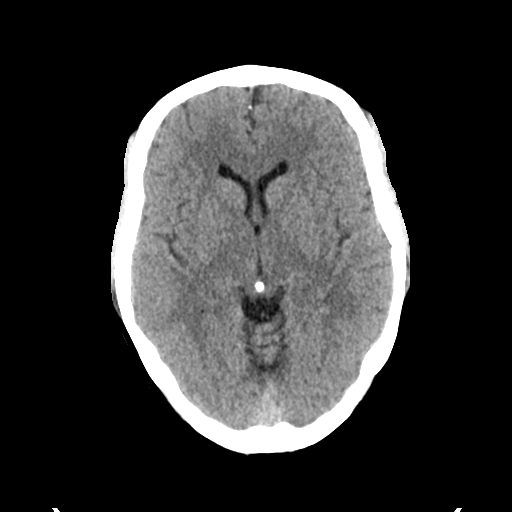
[im 15/31  brain]
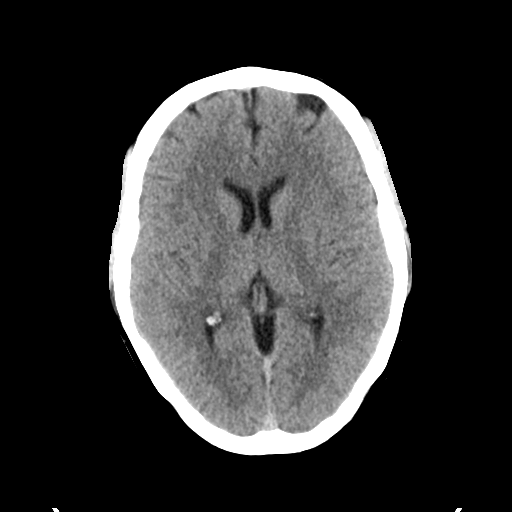
[im 16/31  brain]
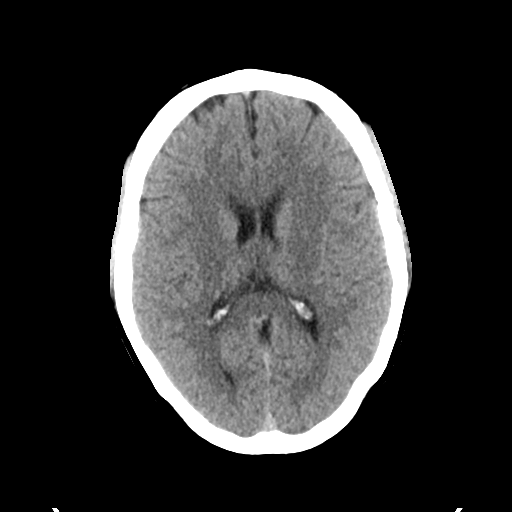
[im 16/31  bone]
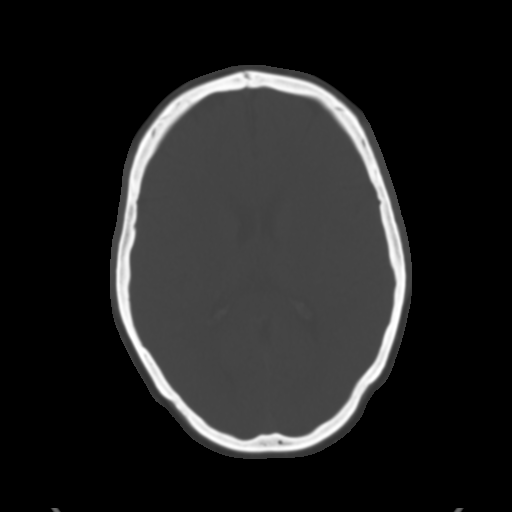
[im 18/31  brain]
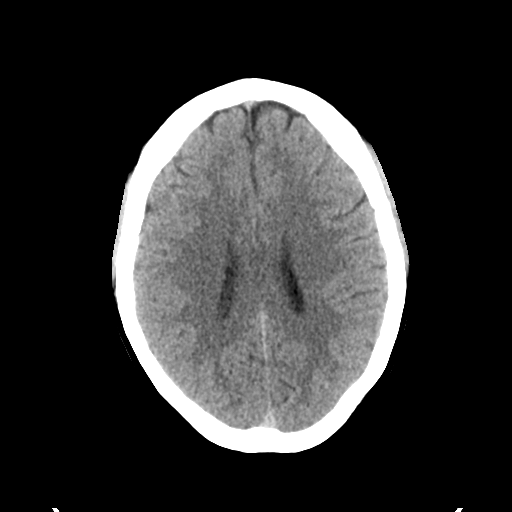
[im 20/31  brain]
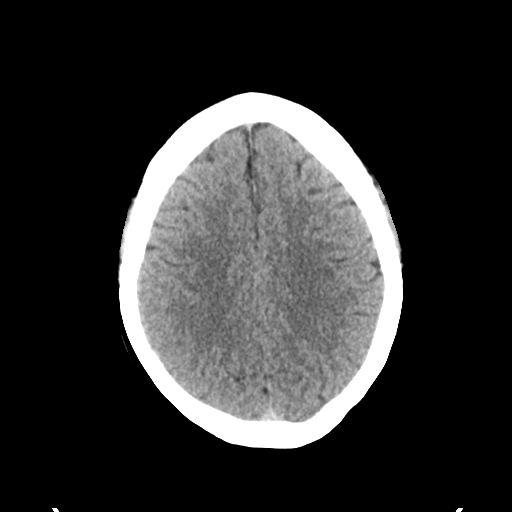
[im 22/31  brain]
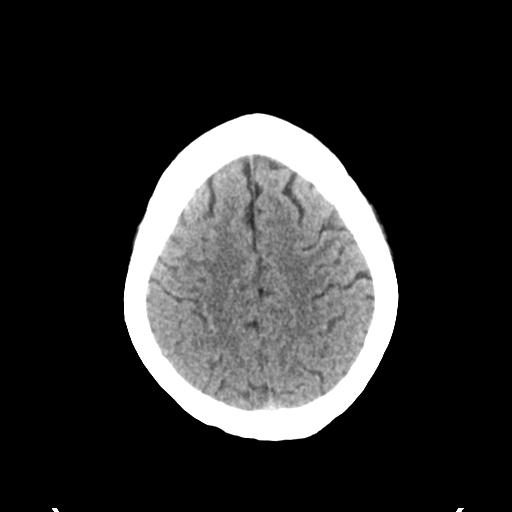
[im 23/31  brain]
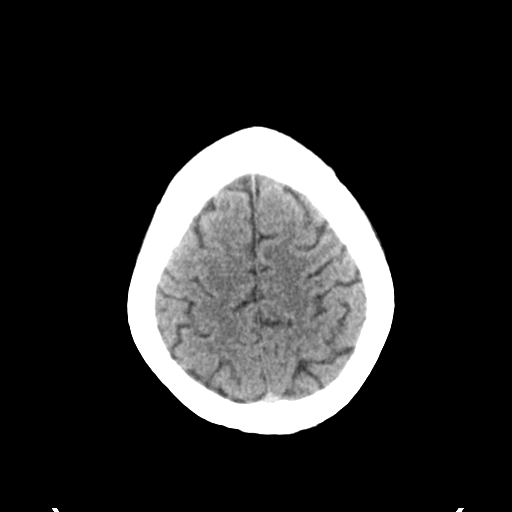
[im 23/31  bone]
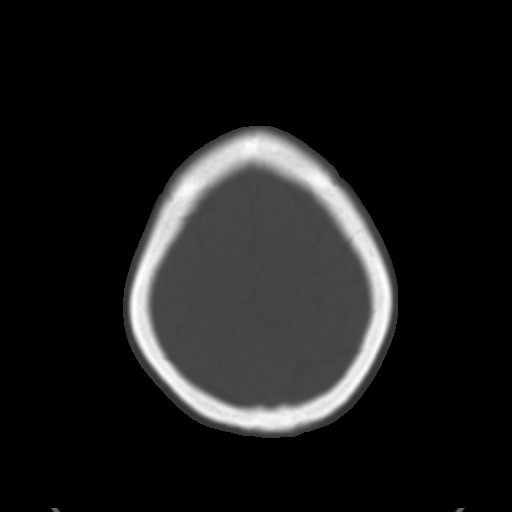
[im 25/31  brain]
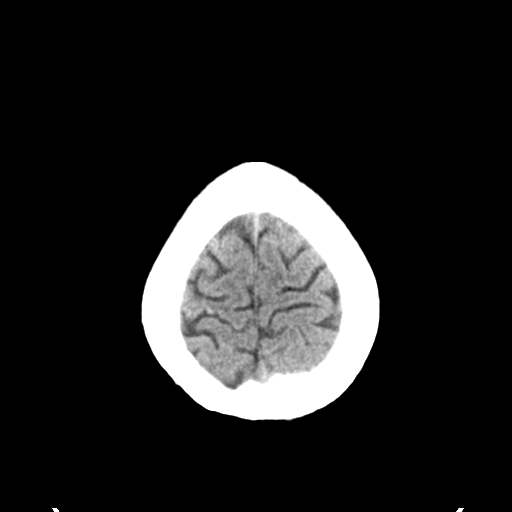
[im 27/31  brain]
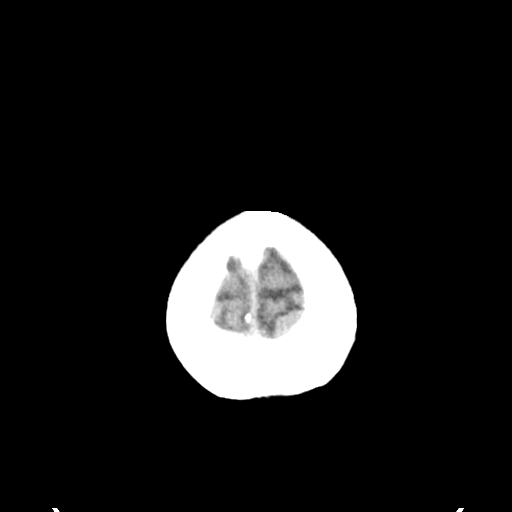
[im 29/31  brain]
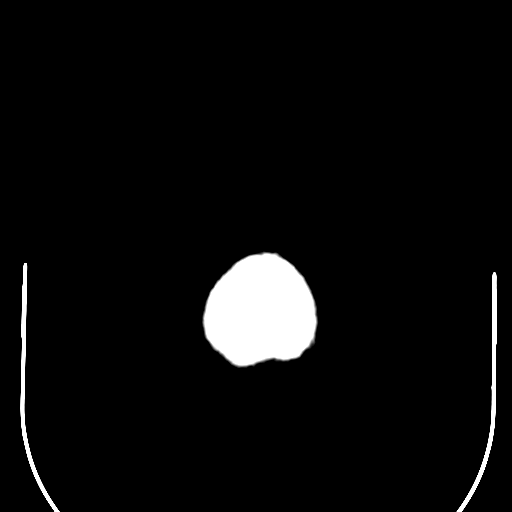

[16 of 30 positions shown; findings below may reference images not displayed]

FINDINGS: No skull fracture is noted. There is nodular mucosal thickening
posterior aspect of the right maxillary sinus probable mucous
retention cyst. The mastoid air cells are unremarkable.

No intracranial hemorrhage, mass effect or midline shift.

No acute infarction. No hydrocephalus. The gray and white-matter
differentiation is preserved.
IMPRESSION: No acute intracranial abnormality. Probable mucous retention cyst
posterior aspect of the right maxillary sinus measures 1.3 cm.

## 2014-05-30 MED ORDER — THIAMINE HCL 100 MG/ML IJ SOLN
100.0000 mg | Freq: Every day | INTRAMUSCULAR | Status: DC
  Administered 2014-05-30: 100 mg via INTRAVENOUS
  Filled 2014-05-30: qty 2

## 2014-05-30 MED ORDER — IBUPROFEN 200 MG PO TABS
600.0000 mg | ORAL_TABLET | Freq: Once | ORAL | Status: AC
  Administered 2014-05-30: 600 mg via ORAL
  Filled 2014-05-30: qty 3

## 2014-05-30 MED ORDER — ACETAMINOPHEN 325 MG PO TABS
650.0000 mg | ORAL_TABLET | Freq: Once | ORAL | Status: DC
  Filled 2014-05-30: qty 2

## 2014-05-30 NOTE — ED Notes (Signed)
Pt attempted to use bedpan; no urine; instructed to call when could void.

## 2014-05-30 NOTE — ED Notes (Signed)
Patient transported to CT 

## 2014-05-30 NOTE — ED Notes (Addendum)
Pt at home; right arm tightness and felt very strange. Got child to call 911 but then had seizure activity. Described as right sided activity. Headache for several days. Post ictal and loss of bladder control. Pt nauseated and given 4 zofran per EMS

## 2014-05-30 NOTE — ED Notes (Signed)
Pt returned from CT scan; back on monitor.

## 2014-05-30 NOTE — ED Notes (Signed)
MD at bedside. 

## 2014-05-30 NOTE — Discharge Instructions (Signed)

## 2014-05-30 NOTE — Progress Notes (Signed)
ED CM noted patient to not have a PCP or health insurance. Reviewed patient's record, and met with patient at bedside confirmed information. Patient reports not having health insurance or a PCP. Discussed the importance and benefits to establishing primary primary care, patient verbalizes understanding. Discussed the [facility] and the services rendered, including pharmacy services, patient is agreeable with establishing f/u care. Provided patient with verbal and written information about the [facility] Monday - Friday 9-3p and process to become an establish patient. Patient verbalized and appreciation for the assistance. Updated Elizabeth RN on Pod A on disposition plan.

## 2014-05-30 NOTE — ED Provider Notes (Signed)
CSN: [id number]     Arrival date & time 05/30/14  1151 History   First MD Initiated Contact with Patient 05/30/14 1202     Chief Complaint  Patient presents with  . Seizures   HPI Patient presents to the emergency room with complaints of possible seizure activity. Patient was at home today when she felt her right arm started to feel strange and tight.  Patient felt it move towards the right side of her body. She then had what bystanders is described as full body shaking. The patient's daughter called EMS. EMS reported the patient was confused and lost control of her bladder. The patient has no memory of the event after the shaking episode and to EMS arrived. She feels sore all over right now but denies any numbness or weakness. She has no other complaints. No headache now. She has had headache the last few days. No fevers or chills. No history of seizures in the past. Past Medical History  Diagnosis Date  . BV (bacterial vaginosis)   . Fibroids   . Trichomonas   . Menorrhagia   . Dysmenorrhea   . Yeast infection   . Pneumonia   . Seasonal allergies    Past Surgical History  Procedure Laterality Date  . Hysteroscopy w/d&c    . Ablation    . Tubal ligation    . Wisdom tooth extraction    . Eye surgery     Family History  Problem Relation Age of Onset  . Cancer Maternal Grandmother     unsure of type   History  Substance Use Topics  . Smoking status: Current Some Day Smoker  . Smokeless tobacco: Never Used  . Alcohol Use: Yes     Comment: occasional   OB History    Gravida Para Term Preterm AB TAB SAB Ectopic Multiple Living   4 3        3      Review of Systems  All other systems reviewed and are negative.     Allergies  Ivp dye and Bee venom  Home Medications   Prior to Admission medications   Medication Sig Start Date End Date Taking? Authorizing Provider  Calcium-Vitamin D-Vitamin K (CALCIUM SOFT CHEWS PO) Take 1 tablet by mouth daily. Viactiv   Yes Historical  Provider, MD  Carboxymethylcellul-Glycerin (LUBRICATING EYE DROPS OP) Apply 1-2 drops to eye as needed (dry eyes).   Yes Historical Provider, MD  Cyanocobalamin (VITAMIN B-12 PO) Take by mouth.   Yes Historical Provider, MD  fluticasone (FLONASE) 50 MCG/ACT nasal spray Place 1-2 sprays into both nostrils daily.   Yes Historical Provider, MD  ibuprofen (ADVIL,MOTRIN) 200 MG tablet Take 600-800 mg by mouth every 6 (six) hours as needed.   Yes Historical Provider, MD  Multiple Vitamins-Minerals (MULTIVITAMIN WITH MINERALS) tablet Take 1 tablet by mouth daily.   Yes Historical Provider, MD   BP 122/98 mmHg  Pulse 83  Temp(Src) 98.8 F (37.1 C) (Oral)  Resp 25  SpO2 100%  LMP 05/22/2014 Physical Exam  Constitutional: She appears well-developed and well-nourished. No distress.  HENT:  Head: Normocephalic and atraumatic.  Right Ear: External ear normal.  Left Ear: External ear normal.  Eyes: Conjunctivae are normal. Right eye exhibits no discharge. Left eye exhibits no discharge. No scleral icterus.  Neck: Neck supple. No tracheal deviation present.  Cardiovascular: Normal rate, regular rhythm and intact distal pulses.   Pulmonary/Chest: Effort normal and breath sounds normal. No stridor. No respiratory distress. She has  no wheezes. She has no rales.  Abdominal: Soft. Bowel sounds are normal. She exhibits no distension. There is no tenderness. There is no rebound and no guarding.  Musculoskeletal: She exhibits no edema or tenderness.  Neurological: She is alert. She has normal strength. No cranial nerve deficit (no facial droop, extraocular movements intact, no slurred speech) or sensory deficit. She exhibits normal muscle tone. She displays no seizure activity. Coordination normal.  Skin: Skin is warm and dry. No rash noted.  Psychiatric: She has a normal mood and affect.  Nursing note and vitals reviewed.   ED Course  Procedures (including critical care time) Labs Review Labs Reviewed   CBC WITH DIFFERENTIAL/PLATELET - Abnormal; Notable for the following:    WBC 11.3 (*)    Platelets 99 (*)    Neutro Abs 8.6 (*)    All other components within normal limits  COMPREHENSIVE METABOLIC PANEL - Abnormal; Notable for the following:    Glucose, Bld 113 (*)    AST 40 (*)    All other components within normal limits  URINE RAPID DRUG SCREEN (HOSP PERFORMED)  ETHANOL  I-STAT BETA HCG BLOOD, ED ([facility], [facility], [facility] ONLY)    Imaging Review Ct Head Wo Contrast  05/30/2014   CLINICAL DATA:  Headache for several days  EXAM: CT HEAD WITHOUT CONTRAST  TECHNIQUE: Contiguous axial images were obtained from the base of the skull through the vertex without intravenous contrast.  COMPARISON:  09/08/2003  FINDINGS: No skull fracture is noted. There is nodular mucosal thickening posterior aspect of the right maxillary sinus probable mucous retention cyst. The mastoid air cells are unremarkable.  No intracranial hemorrhage, mass effect or midline shift.  No acute infarction. No hydrocephalus. The gray and white-matter differentiation is preserved.  IMPRESSION: No acute intracranial abnormality. Probable mucous retention cyst posterior aspect of the right maxillary sinus measures 1.3 cm.   Electronically Signed   By: Lahoma Crocker M.D.   On: 05/30/2014 13:20     MDM   Final diagnoses:  Seizure    Patient had no further seizure activity in the emergency department. Family members me that it was the patient's birthday yesterday. She did drink a significant amount of alcohol. This certainly could've been a contributing factor. We'll give her a referral to neurology. I suggested decreasing her alcohol consumption. Patient also understands to not drive or do any heavy equipment until evaluated by neurology.    Dorie Rank, MD 05/30/14 [phone]

## 2015-06-14 ENCOUNTER — Ambulatory Visit (HOSPITAL_COMMUNITY)
Admission: EM | Admit: 2015-06-14 | Discharge: 2015-06-14 | Disposition: A | Discharge status: Home / Self Care | Payer: 59 | Attending: Emergency Medicine | Admitting: Emergency Medicine

## 2015-06-14 ENCOUNTER — Encounter (HOSPITAL_COMMUNITY): Payer: Self-pay | Admitting: Emergency Medicine

## 2015-06-14 DIAGNOSIS — R51 Headache: Secondary | ICD-10-CM | POA: Diagnosis not present

## 2015-06-14 DIAGNOSIS — G8929 Other chronic pain: Secondary | ICD-10-CM

## 2015-06-14 MED ORDER — DEXAMETHASONE SODIUM PHOSPHATE 10 MG/ML IJ SOLN
INTRAMUSCULAR | Status: AC
  Filled 2015-06-14: qty 1

## 2015-06-14 MED ORDER — DEXAMETHASONE SODIUM PHOSPHATE 10 MG/ML IJ SOLN
10.0000 mg | Freq: Once | INTRAMUSCULAR | Status: AC
  Administered 2015-06-14: 10 mg via INTRAMUSCULAR

## 2015-06-14 MED ORDER — KETOROLAC TROMETHAMINE 60 MG/2ML IM SOLN
INTRAMUSCULAR | Status: AC
  Filled 2015-06-14: qty 2

## 2015-06-14 MED ORDER — KETOROLAC TROMETHAMINE 60 MG/2ML IM SOLN
60.0000 mg | Freq: Once | INTRAMUSCULAR | Status: AC
  Administered 2015-06-14: 60 mg via INTRAMUSCULAR

## 2015-06-14 MED ORDER — INDOMETHACIN 50 MG PO CAPS: 50.0000 mg | ORAL_CAPSULE | Freq: Two times a day (BID) | ORAL | Status: DC

## 2015-06-14 NOTE — ED Notes (Signed)
Here with right side headache intermit Started 2 months ago, denies visual changes or nausea No medical problems reported, vss Pt has scheduled appt with [facility] on next Monday for right eye problems x7 years

## 2015-06-14 NOTE — Discharge Instructions (Signed)
General Headache Without Cause A headache is pain or discomfort felt around the head or neck area. The specific cause of a headache may not be found. There are many causes and types of headaches. A few common ones are:  Tension headaches.  Migraine headaches.  Cluster headaches.  Chronic daily headaches. HOME CARE INSTRUCTIONS  Watch your condition for any changes. Take these steps to help with your condition: Managing Pain  Take over-the-counter and prescription medicines only as told by your health care provider.  Lie down in a dark, quiet room when you have a headache.  If directed, apply ice to the head and neck area:  Put ice in a plastic bag.  Place a towel between your skin and the bag.  Leave the ice on for 20 minutes, 2-3 times per day.  Use a heating pad or hot shower to apply heat to the head and neck area as told by your health care provider.  Keep lights dim if bright lights bother you or make your headaches worse. Eating and Drinking  Eat meals on a regular schedule.  Limit alcohol use.  Decrease the amount of caffeine you drink, or stop drinking caffeine. General Instructions  Keep all follow-up visits as told by your health care provider. This is important.  Keep a headache journal to help find out what may trigger your headaches. For example, write down:  What you eat and drink.  How much sleep you get.  Any change to your diet or medicines.  Try massage or other relaxation techniques.  Limit stress.  Sit up straight, and do not tense your muscles.  Do not use tobacco products, including cigarettes, chewing tobacco, or e-cigarettes. If you need help quitting, ask your health care provider.  Exercise regularly as told by your health care provider.  Sleep on a regular schedule. Get 7-9 hours of sleep, or the amount recommended by your health care provider. SEEK MEDICAL CARE IF:   Your symptoms are not helped by medicine.  You have a  headache that is different from the usual headache.  You have nausea or you vomit.  You have a fever. SEEK IMMEDIATE MEDICAL CARE IF:   Your headache becomes severe.  You have repeated vomiting.  You have a stiff neck.  You have a loss of vision.  You have problems with speech.  You have pain in the eye or ear.  You have muscular weakness or loss of muscle control.  You lose your balance or have trouble walking.  You feel faint or pass out.  You have confusion.   This information is not intended to replace advice given to you by your health care provider. Make sure you discuss any questions you have with your health care provider.   Document Released: 02/20/2005 Document Revised: 11/11/2014 Document Reviewed: 06/15/2014 Elsevier Interactive Patient Education 2016 [employer].  Indomethacin-Responsive Headache, Adult An indomethacin-responsive headache is a headache that gets better when you take a medicine called indomethacin. Indomethacin is a kind of nonsteroidal anti-inflammatory drug (NSAID). It stops the body from making a substance that causes pain, fever, and swelling. Indomethacin can quickly stop the pain from some kinds of indomethacin-responsive headaches, such as:  Paroxysmal hemicrania. This is a series of short but severe headaches. This is usually on just one side of the head.  Hemicrania continua. Pain is nonstop and on one side of the face.  Primary exertional headache. Exercise sets off these headaches.  Primary cough headache. Pain may come  from pressure in the brain when coughing or straining. There are other types of indomethacin-responsive headaches, but they are very rare. CAUSES The exact cause of indomethacin-responsive headaches is not known, but there are conditions that may cause a headache to start. These are called triggers. Triggers for indomethacin-responsive headaches depend on the kind of headache you have. Triggers may include the ones  listed here for each kind of headache:  Paroxysmal hemicrania:  Moving the head in certain ways.  Stress.  Exercise.  Pressure on sensitive areas of the neck.  Drinking alcohol.  Hemicrania continua:  Physical exercise.  Drinking alcohol.  Primary exertional headache:  Physical exercise.  Physical activity.  Primary cough headache:  Coughing.  Sneezing.  Activities that involve stretching or straining. RISK FACTORS Risk factors for indomethacin-responsive headaches depend on the kind of headache you have. Risk factors may include the ones listed here for each kind of headache:  Paroxysmal hemicrania:  Being 65-41 years old.  Being female.  Serious head injury or trauma.  Hemicrania continua:  Being female.  Having family members who get this type of headache.  Primary exertional headache:  Being female.  Having had migraine headaches.  Primary cough headache:  Being female.  Being older than 70. SIGNS AND SYMPTOMS Each kind of indomethacin-responsive headache has different symptoms. Symptoms may include the ones listed here for each kind of headache:  Paroxysmal hemicrania:  Having about ten headaches a day. Each may last from a few minutes to two hours.  Severe, pounding pain.  Pain usually on just one side of the head. It often centers around the eye or in the forehead.  A watery eye, which may become red or swollen.  Droopy or swollen eyelid.  Having a red or pinkish face and sweating.  A stuffy, runny nose.  Hemicrania continua:  All-day headache. This may occur daily for at least three months. Then there may be no headaches for weeks or months.  Pain that gets worse several times during the day.  Pain in the face area, on one side only. It almost always occurs on the same side.  A watery eye. It may also become droopy, red, and swollen.  A stuffy, runny nose.  Pain that gets worse with sound or light.  Primary exertional  headache:  Pain after physical activity.  Pounding or throbbing pain.  Pain that lasts for 5 minutes to 48 hours, or sometimes even longer.  Primary cough headache:  Pain that starts after coughing, sneezing, or straining.  Sharp, stabbing pain.  Pain on both sides of the head. It is often worse in the back of the head.  Pain that is severe for a few minutes and then dull for several hours. DIAGNOSIS To diagnose your headaches, your healthcare provider may:  Ask about your headaches.  Do a physical exam.  Order some tests. These may include:  Blood and urine tests. These tests check for infections and toxins or poisons in your body.  Spinal tap (lumbar puncture). A needle is used to take a sample of fluid from your spine. This fluid will show if there is an infection, bleeding in your brain (brain hemorrhage), or extra pressure inside your skull.  Imaging tests. These create pictures of your blood vessels, bones, and the brain. If no medical condition is causing your headaches, your healthcare provider may test treatments by having you take indomethacin. If yours is an indomethacin-responsive headache, your symptoms should go away quickly. TREATMENT  Taking indomethacin.  Other medicines may also be given for indomethacin-responsive headaches. They include:  Other types of NSAIDs.  Antiemetics. These medicines relieve nausea and vomiting.  Antacids. These medicines relieve stomachache or heartburn. HOME CARE INSTRUCTIONS  Take medicines only as directed by your health care provider.  Be sure to tell your health care provider about all medicines you are taking, including vitamins, herbs, eye drops, creams, and over-the-counter medicines.  It may help to:  Rest in a dark, quiet room.  Put a cool, damp washcloth on your head or face.  Sleep.  Keep a routine. Go to bed at the same time each night and get up at the same time every morning.  Eat on a regular  schedule.Do not skip meals.  Try to avoid stressful situations.  Limit or avoid caffeine.  Limit alcohol intake to no more than 1 drink per day for nonpregnant women and 2 drinks per day for men. One drink equals 12 ounces of beer, 5 ounces of wine, or 1 ounces of hard liquor.  Do not use any tobacco products, including cigarettes, chewing tobacco, or electronic cigarettes. If you need help quitting, ask your health care provider.  Keep a headache diary. Each time you have a headache, write down:  When it started and stopped. Include the day and time.  How it felt.  Whether light, sounds, or smells made the pain worse.  What you were doing right before the headache started.  How much sleep you had the night before.  Any stress you were feeling before the headache.  Everything you ate and drank for 24 hours before the pain started. Avoid those substances that trigger your headaches. These may include foods such as cheese, processed meats, chocolate, and nuts.  All the medicines you took. SEEK MEDICAL CARE IF:  Your pain continues even with treatment.  You have nausea.  You have a fever. SEEK IMMEDIATE MEDICAL CARE IF:  Your pain suddenly becomes much worse.  You have bad stomach pain or vomiting.  You vomit blood.  You have blood in your stool. MAKE SURE YOU:  Understand these instructions.  Will watch your condition.  Will get help right away if you are not doing well or get worse.   This information is not intended to replace advice given to you by your health care provider. Make sure you discuss any questions you have with your health care provider.   Document Released: 02/08/2009 Document Revised: 03/13/2014 Document Reviewed: 07/08/2013 Elsevier Interactive Patient Education [employer].

## 2015-06-14 NOTE — ED Provider Notes (Addendum)
CSN: [id number]     Arrival date & time 06/14/15  1300 History   First MD Initiated Contact with Patient 06/14/15 1314     Chief Complaint  Patient presents with  . Headache   (Consider location/radiation/quality/duration/timing/severity/associated sxs/prior Treatment) HPI Comments: 40 year old female presents to the urgent care with a headache for 2 months. The headache is located to the right frontotemporal area. It does not radiate. It is daily, occurs all day long. Describes it as sharp and throbbing. It often occurs during the night and she has trouble sleeping. Denies problems with speech hearing or swallowing. Denies new problems with vision. It is noted that she has a chronic right eye problem in which she has had previous surgeries. She apparently has had needle injections in her eye as well as having pressure reduced. She notes that her right eye is often read and this is chronic. Denies photophobia, occasionally has mild intermittent nausea. Denies focal paresthesia or weakness. She had a seizure one year ago and was advised to follow-up with urologist and given referral. She did not have insurance and was never able to follow-up. She saw an ophthalmologist one month ago and she does not recall any conclusions from him and is waiting for an appointment next week for follow-up. She states that she was told that her right eye problems have nothing to do with her headache.  She often takes ibuprofen and this will temporarily relieve her partially relieve the headache. Over the weekend this did not relieve her headache and it has been persistent.    Past Medical History  Diagnosis Date  . BV (bacterial vaginosis)   . Fibroids   . Trichomonas   . Menorrhagia   . Dysmenorrhea   . Yeast infection   . Pneumonia   . Seasonal allergies    Past Surgical History  Procedure Laterality Date  . Hysteroscopy w/d&c    . Ablation    . Tubal ligation    . Wisdom tooth extraction    . Eye surgery      Family History  Problem Relation Age of Onset  . Cancer Maternal Grandmother     unsure of type   Social History  Substance Use Topics  . Smoking status: Current Some Day Smoker  . Smokeless tobacco: Never Used  . Alcohol Use: Yes     Comment: occasional   OB History    Gravida Para Term Preterm AB TAB SAB Ectopic Multiple Living   4 3        3      Review of Systems  Constitutional: Positive for activity change. Negative for fever, chills and diaphoresis.  Eyes: Positive for redness and visual disturbance. Negative for photophobia.       The visual disturbance the patient describes in the right eye is chronic, greater than 7 years. No acute changes.  Respiratory: Negative.   Cardiovascular: Negative.   Gastrointestinal: Negative.   Genitourinary: Negative.   Musculoskeletal: Negative.   Skin: Negative.   Neurological: Positive for headaches. Negative for dizziness, tremors, syncope, facial asymmetry, speech difficulty and weakness.  Psychiatric/Behavioral: Positive for sleep disturbance. The patient is nervous/anxious.     Allergies  Ivp dye and Bee venom  Home Medications   Prior to Admission medications   Medication Sig Start Date End Date Taking? Authorizing Provider  Calcium-Vitamin D-Vitamin K (CALCIUM SOFT CHEWS PO) Take 1 tablet by mouth daily. Viactiv    Historical Provider, MD  Carboxymethylcellul-Glycerin (LUBRICATING EYE DROPS OP) Apply 1-2  drops to eye as needed (dry eyes).    Historical Provider, MD  Cyanocobalamin (VITAMIN B-12 PO) Take by mouth.    Historical Provider, MD  fluticasone (FLONASE) 50 MCG/ACT nasal spray Place 1-2 sprays into both nostrils daily.    Historical Provider, MD  ibuprofen (ADVIL,MOTRIN) 200 MG tablet Take 600-800 mg by mouth every 6 (six) hours as needed.    Historical Provider, MD  indomethacin (INDOCIN) 50 MG capsule Take 1 capsule (50 mg total) by mouth 2 (two) times daily with a meal. 06/14/15   Janne Napoleon, NP  Multiple  Vitamins-Minerals (MULTIVITAMIN WITH MINERALS) tablet Take 1 tablet by mouth daily.    Historical Provider, MD   Meds Ordered and Administered this Visit   Medications  ketorolac (TORADOL) injection 60 mg (60 mg Intramuscular Given 06/14/15 1351)  dexamethasone (DECADRON) injection 10 mg (10 mg Intramuscular Given 06/14/15 1352)    BP 119/67 mmHg  Pulse 67  Temp(Src) 98.3 F (36.8 C) (Oral)  Resp 16  SpO2 99%  LMP 05/19/2015 No data found.   Physical Exam  Constitutional: She is oriented to person, place, and time. She appears well-developed and well-nourished. No distress.  HENT:  Head: Normocephalic and atraumatic.  Bilateral TMs are normal Oropharynx is clear. Soft palate rises symmetrically. Tongue and uvula midline. Swallowing reflex is normal.  Eyes: EOM are normal.  Left pupil small and reactive to light. Right pupil is irregular in shape. It is near half spherical or like a waxing gibbus shape. The inferior half is truncated. She has had surgeries and various procedures performed on her right eye and she was unaware of any pupillary changes. It is poorly reactive to light. The right sclera is mildly injected. Minor puffiness to the upper and lower lid. According to the patient majority of the symptoms are chronic.  Neck: Normal range of motion.  Cardiovascular: Normal rate, regular rhythm, normal heart sounds and intact distal pulses.   Pulmonary/Chest: Effort normal and breath sounds normal. No respiratory distress.  Musculoskeletal: Normal range of motion. She exhibits no edema or tenderness.  Lymphadenopathy:    She has no cervical adenopathy.  Neurological: She is alert and oriented to person, place, and time. She has normal strength. She displays no tremor. No cranial nerve deficit or sensory deficit. She exhibits normal muscle tone. She displays no seizure activity. Coordination and gait normal.  Fully awake and alert. Memory and recall intact. Speech is clear and goal  oriented. No dysarthria. Motor intact with normal coordination.  Skin: Skin is warm and dry.  Nursing note and vitals reviewed.   ED Course  Procedures (including critical care time)  Labs Review Labs Reviewed - No data to display  Imaging Review No results found.   Visual Acuity Review  Right Eye Distance:   Left Eye Distance:   Bilateral Distance:    Right Eye Near:   Left Eye Near:    Bilateral Near:         MDM   1. Chronic nonintractable headache, unspecified headache type    Meds ordered this encounter  Medications  . ketorolac (TORADOL) injection 60 mg    Sig:   . dexamethasone (DECADRON) injection 10 mg    Sig:   . indomethacin (INDOCIN) 50 MG capsule    Sig: Take 1 capsule (50 mg total) by mouth 2 (two) times daily with a meal.    Dispense:  20 capsule    Refill:  0    Order Specific Question:  Supervising Provider    Answer:  Melony Overly [phone]   Call neuro for appt ASAP If worse go to the ED Keep appt with opthal next week.    Janne Napoleon, NP 06/14/15 1357  Janne Napoleon, NP 06/14/15 [phone]

## 2015-06-23 ENCOUNTER — Telehealth: Payer: Self-pay | Admitting: Neurology

## 2015-06-23 NOTE — Telephone Encounter (Signed)
Pt called in to move her NP appt up. However, we do not have any openings. She is in extreme pain and feels she is have cluster headaches. Is there anyway we can see her earlier? Please call and advise [phone]

## 2015-06-23 NOTE — Telephone Encounter (Signed)
Spoke to Diane in NP referrals. She is going to call patient.

## 2015-06-23 NOTE — Telephone Encounter (Signed)
Dr Jaynee Eagles- please advise. You are gone next week and she is scheduled for 07/05/15 when you get back. I do not see anything available this week. Do you want her to see a different provider?

## 2015-06-23 NOTE — Telephone Encounter (Signed)
She is a new patient, I don't have any new patient appointment available. We can schedule her with a different provider.  Please go ahaead and try to schedule her earlier with a different physician, I have never seen her thanks!

## 2015-06-24 ENCOUNTER — Ambulatory Visit (INDEPENDENT_AMBULATORY_CARE_PROVIDER_SITE_OTHER): Payer: 59 | Admitting: Neurology

## 2015-06-24 ENCOUNTER — Encounter: Payer: Self-pay | Admitting: Neurology

## 2015-06-24 VITALS — BP 120/70 | HR 60 | Resp 16 | Ht 60.0 in | Wt 169.0 lb

## 2015-06-24 DIAGNOSIS — M542 Cervicalgia: Secondary | ICD-10-CM | POA: Diagnosis not present

## 2015-06-24 DIAGNOSIS — R51 Headache: Secondary | ICD-10-CM

## 2015-06-24 DIAGNOSIS — R569 Unspecified convulsions: Secondary | ICD-10-CM | POA: Diagnosis not present

## 2015-06-24 DIAGNOSIS — H348192 Central retinal vein occlusion, unspecified eye, stable: Secondary | ICD-10-CM

## 2015-06-24 DIAGNOSIS — R519 Headache, unspecified: Secondary | ICD-10-CM | POA: Insufficient documentation

## 2015-06-24 MED ORDER — ESCITALOPRAM OXALATE 10 MG PO TABS: 10.0000 mg | ORAL_TABLET | Freq: Every day | ORAL | Status: DC

## 2015-06-24 MED ORDER — SUMATRIPTAN SUCCINATE 100 MG PO TABS: 100.0000 mg | ORAL_TABLET | Freq: Once | ORAL | Status: DC | PRN

## 2015-06-24 MED ORDER — VERAPAMIL HCL ER 240 MG PO TBCR: 240.0000 mg | EXTENDED_RELEASE_TABLET | Freq: Every day | ORAL | Status: DC

## 2015-06-24 NOTE — Patient Instructions (Signed)
I have called in some medications to the pharmacy: For verapamil is to take one pill a day and will hopefully help reduce the number of headaches Escitalopram is 4 the anxiety and mood issues. Sumatriptan is to help get rid of a severe headache when it first comes on. Take no more than 2 pills in any one day and no more than 4 pills in any week.  If you're headache is not better by Monday give [place] a call and we will get you worked in the Monday or Tuesday.

## 2015-06-24 NOTE — Progress Notes (Signed)
[facility]  PATIENT: Angel Rodriguez DOB: 26-Jul-1975  REFERRING DOCTOR OR PCP:   SOURCE: patient, ED records, images on PACS, reports in EMR  _________________________________   HISTORICAL  CHIEF COMPLAINT:  Chief Complaint  Patient presents with  . Headache    Angel Rodriguez is here with her mother Angel Rodriguez, for eval of frequent h/a's onset Jan. 2017.  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  Sts. 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  She is a poor historian--sts. she had a lack of proper investigation into the cause of vision loss, due to not haivng insurance at the time.  Sts. has had laser surgeries right eye.  She is using rx. eye   . Decreased Visual Acuity    drops.  Vision in left eye is 20/20 today with corrective lenses.  Sts. initially, Ibuprofen helped h/a's.  Has also tried Indomethacin but it didn't help.  Previous CT head--results in EPIC/fim    HISTORY OF PRESENT ILLNESS:  I had the pleasure seeing you patient, Angel Rodriguez, at [facility] for neurologic consultation regarding her intractable headache.   She is a 40 year old woman who has had daily headaches since January 2017.    The headaches began, they were more intermittent. They have always been on the right side of her head and the character of the pain will change with intensity becoming more pounding when it is more severe.  With the pain becomes more severe, she has phonophobia, photophobia and moving her head will worsen the pain. She does not really get much nausea and has not had vomiting. She has taken many over-the-counter medications, mostly ibuprofen. Initially, taking ibuprofen helped the headache a lot. However, more recently, when she takes ibuprofen she feels a little bit better for a while and then the headache intensifies again. She has  started to take Excedrin Migraine. She is taking the over-the-counter medication multiple times every day.   There is no aura.  Occasionally, headache has awakened her at night.    She went to the urgent care for 12/24/2015. She had a shot of 60 mg Toradol and 10 mg Decadron and was given a prescription for indomethacin. She did not think that the indomethacin helped anymore than the other anti-inflammatories.  She note the week before her HA started there was a lot of construction in her office with noise.      About 8 years ago, she lost vision in the right eye. I was able to pull up some records from [facility]. 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 Because of the edema, she was getting injections of steroid in the past.  In March 2016, she had a seizure. A CAT scan did not show any significant abnormality. She had not followed up with neurology after the seizure. She had an aura of numbness in her right hand and then passed out.   She is not sure there was any generalized tonic-clonic activity. Her children did not tell her that she had any. However, she did have urinary incontinence. She does not remember much that day.    Since then, she has not had any more seizures.   I  personally reviewed the CT scan of the head from 05/30/2014. It is essentially normal. There is a small retention cyst in the  right maxillary sinus.  REVIEW OF SYSTEMS: Constitutional: No fevers, chills, sweats, or change in appetite Eyes: as above Ear, nose and throat: No hearing loss, ear pain, nasal congestion, sore throat Cardiovascular: No chest pain, palpitations Respiratory: No shortness of breath at rest or with exertion.   No wheezes GastrointestinaI: No nausea, vomiting, diarrhea, abdominal pain, fecal incontinence Genitourinary: No dysuria, urinary retention or frequency.  No nocturia. Musculoskeletal: No neck pain, back pain Integumentary: No rash, pruritus,  skin lesions Neurological: as above Psychiatric: No depression at this time.  No anxiety Endocrine: No palpitations, diaphoresis, change in appetite, change in weigh or increased thirst Hematologic/Lymphatic: No anemia, purpura, petechiae. Allergic/Immunologic: No itchy/runny eyes, nasal congestion, recent allergic reactions, rashes  ALLERGIES: Allergies  Allergen Reactions  . Ivp Dye [Iodinated Diagnostic Agents] Hives, Itching and Swelling  . Bee Venom Swelling    HOME MEDICATIONS:  Current outpatient prescriptions:  .  brimonidine (ALPHAGAN P) 0.1 % SOLN, , Disp: , Rfl:  .  Carboxymethylcellul-Glycerin (LUBRICATING EYE DROPS OP), Apply 1-2 drops to eye as needed (dry eyes)., Disp: , Rfl:  .  Cyanocobalamin (VITAMIN B-12 PO), Take by mouth., Disp: , Rfl:  .  dorzolamide-timolol (COSOPT) 22.3-6.8 MG/ML ophthalmic solution, , Disp: , Rfl:  .  fluticasone (FLONASE) 50 MCG/ACT nasal spray, Place 1-2 sprays into both nostrils daily., Disp: , Rfl:  .  ibuprofen (ADVIL,MOTRIN) 200 MG tablet, Take 600-800 mg by mouth every 6 (six) hours as needed., Disp: , Rfl:  .  Multiple Vitamins-Minerals (MULTIVITAMIN WITH MINERALS) tablet, Take 1 tablet by mouth daily., Disp: , Rfl:  .  escitalopram (LEXAPRO) 10 MG tablet, Take 1 tablet (10 mg total) by mouth daily., Disp: 30 tablet, Rfl: 11 .  indomethacin (INDOCIN) 50 MG capsule, Take 1 capsule (50 mg total) by mouth 2 (two) times daily with a meal. (Patient not taking: Reported on 06/24/2015), Disp: 20 capsule, Rfl: 0 .  SUMAtriptan (IMITREX) 100 MG tablet, Take 1 tablet (100 mg total) by mouth once as needed for migraine. May repeat in 2 hours if headache persists or recurs., Disp: 10 tablet, Rfl: 2 .  verapamil (CALAN-SR) 240 MG CR tablet, Take 1 tablet (240 mg total) by mouth at bedtime., Disp: 30 tablet, Rfl: 2  PAST MEDICAL HISTORY: Past Medical History  Diagnosis Date  . BV (bacterial vaginosis)   . Fibroids   . Trichomonas   . Menorrhagia    . Dysmenorrhea   . Yeast infection   . Pneumonia   . Seasonal allergies     PAST SURGICAL HISTORY: Past Surgical History  Procedure Laterality Date  . Hysteroscopy w/d&c    . Ablation    . Tubal ligation    . Wisdom tooth extraction    . Eye surgery      FAMILY HISTORY: Family History  Problem Relation Age of Onset  . Cancer Maternal Grandmother     unsure of type  . GER disease Mother   . Alcoholism Father   . Cirrhosis Father     SOCIAL HISTORY:  Social History   Social History  . Marital Status: Single    Spouse Name: N/A  . Number of Children: N/A  . Years of Education: N/A   Occupational History  . Not on file.   Social History Main Topics  . Smoking status: Current Some Day Smoker  . Smokeless tobacco: Never Used  . Alcohol Use: Yes     Comment: occasional  . Drug Use: No  . Sexual Activity:  Not on file     Comment: btl   Other Topics Concern  . Not on file   Social History Narrative     PHYSICAL EXAM  Filed Vitals:   06/24/15 0902  BP: 120/70  Pulse: 60  Resp: 16  Height: 5' (1.524 m)  Weight: 169 lb (76.658 kg)    Body mass index is 33.01 kg/(m^2).   General: The patient is well-developed and well-nourished and in no acute distress  Eyes:  Funduscopic exam shows normal left optic discs and retinal vessels.  Right eye has a fixed pupil and discerned retinal details.    There is conjunctival redness on the right  Neck: The neck is supple, no carotid bruits are noted.  The neck is very tender over right occiput  Cardiovascular: The heart has a regular rate and rhythm with a normal S1 and S2. There were no murmurs, gallops or rubs. Lungs are clear to auscultation.  Skin: Extremities are without significant edema.  Musculoskeletal:  Back is nontender  Neurologic Exam  Mental status: The patient is alert and oriented x 3 at the time of the examination. The patient has apparent normal recent and remote memory, with an apparently  normal attention span and concentration ability.   Speech is normal.  Cranial nerves: Extraocular movements are full. Left pupil reacts to light and accomodation.  Visual fields are full.  Facial symmetry is present except minimal right ptosis. There is slight reduced right facial sensation to soft touch bilaterally.Facial strength is normal.  Trapezius and sternocleidomastoid strength is normal. No dysarthria is noted.  The tongue is midline, and the patient has symmetric elevation of the soft palate. No obvious hearing deficits are noted.  Motor:  Muscle bulk is normal.   Tone is normal. Strength is  5 / 5 in all 4 extremities.   Sensory: Sensory testing is intact to pinprick, soft touch and vibration sensation in all 4 extremities.  Coordination: Cerebellar testing reveals good finger-nose-finger and heel-to-shin bilaterally.  Gait and station: Station is normal.   Gait is normal. Tandem gait is normal. Romberg is negative.   Reflexes: Deep tendon reflexes are symmetric and normal bilaterally.   Plantar responses are flexor.    DIAGNOSTIC DATA (LABS, IMAGING, TESTING) - I reviewed patient records, labs, notes, testing and imaging myself where available.  Lab Results  Component Value Date   WBC 11.3* 05/30/2014   HGB 15.0 05/30/2014   HCT 44.2 05/30/2014   MCV 89.5 05/30/2014   PLT 99* 05/30/2014      Component Value Date/Time   NA 140 05/30/2014 1215   K 5.1 05/30/2014 1215   CL 111 05/30/2014 1215   CO2 22 05/30/2014 1215   GLUCOSE 113* 05/30/2014 1215   BUN 9 05/30/2014 1215   CREATININE 0.74 05/30/2014 1215   CALCIUM 9.6 05/30/2014 1215   PROT 7.3 05/30/2014 1215   ALBUMIN 4.0 05/30/2014 1215   AST 40* 05/30/2014 1215   ALT 31 05/30/2014 1215   ALKPHOS 57 05/30/2014 1215   BILITOT 0.6 05/30/2014 1215   GFRNONAA >90 05/30/2014 1215   GFRAA >90 05/30/2014 1215       ASSESSMENT AND PLAN  Intractable headache - Plan: MR Brain W Wo Contrast, MR Venogram Head,  Sedimentation rate, ANA w/Reflex  Convulsions, unspecified convulsion type ([place]) - Plan: MR Brain W Wo Contrast, MR Venogram Head, Sedimentation rate, ANA w/Reflex  Neck pain  Central retinal vein occlusion     In summary, Angel Rodriguez is  a 40 year old woman with an intractable headache for the past 3 months.   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    She did not receive much benefit from indomethacin.  I will check an MRI of the brain and an MR venogram to make sure that there is not a pathologic cause to her actual headache. We will also check ESR and ANA.  To help with her symptoms, a right splenius capitis trigger point injection with occipital nerve block was performed. Pain was significantly better after 2 or 3 minutes.  I will place her on verapamil 240 mg daily and have her take sumatriptan more than was a week with the headaches become most intense.  If she is not better by next week, I will consider having her come back in to do a sphenopalatine ganglion block.   If better, I will see her in a couple weeks. She should call with any new symptoms.  I am not certain if the event she had last year was a seizure or not. Events but I would consider an EEG and antiepileptic agents if she does.      Thank you for asking me to see Mrs. Bouchie for neurologic consultation. Please note I can be of further assistance with her or other patients in the future.  Jesslynn Kruck A. Felecia Shelling, MD, PhD 08/09/7701, 4:03 PM Certified in Neurology, Clinical Neurophysiology, Sleep Medicine, Pain Medicine and Neuroimaging  [facility] [address], [place], [place] [address] [phone]

## 2015-06-25 ENCOUNTER — Telehealth: Payer: Self-pay | Admitting: Neurology

## 2015-06-25 ENCOUNTER — Telehealth: Payer: Self-pay | Admitting: Diagnostic Neuroimaging

## 2015-06-25 LAB — ANA W/REFLEX: ANA: NEGATIVE

## 2015-06-25 LAB — SEDIMENTATION RATE: SED RATE: 12 mm/h (ref 0–32)

## 2015-06-25 NOTE — Telephone Encounter (Signed)
Patient called. Asking about escitalopram and possible side effect (had wire feeling, increased energy, chest pain, shortness of breath last night). Now asymptomatic.   PLAN:  - Advised to stop escitalopram and contact Dr. Felecia Shelling next week. Patient agrees with plan.    Penni Bombard, MD [id number], [id number] PM Certified in Neurology, Neurophysiology and Neuroimaging  [facility] [address] [place], [place] [address] [phone]

## 2015-06-25 NOTE — Telephone Encounter (Signed)
I have spoken with Yoana and provided clarification on meds/fim

## 2015-06-25 NOTE — Telephone Encounter (Signed)
Pt called in about verapamil (CALAN-SR) 240 MG CR tablet and escitalopram (LEXAPRO) 10 MG tablet. She wants to know about timing the medication. She took both in the evening and it kept her up all night. Please call and advise [phone]

## 2015-06-28 NOTE — Telephone Encounter (Signed)
I have spoken with Angel Rodriguez, and per RAS, advised ok for Ibuprofen 600mg  tid.  She verbalized understanding of same/fim

## 2015-06-28 NOTE — Telephone Encounter (Signed)
Patient called to advise she stopped escitalopram (LEXAPRO) 10 MG tablet due to bad reaction, wants to know if there is something else she can take? Front temple, eyes are red and swollen still, nose runny, hasn't cleared up. States she took SUMAtriptan (IMITREX) 100 MG tablet and it's not helping, wants to know if she can take ibuprofen or Excedrin migraine? Please call to advise.

## 2015-06-28 NOTE — Telephone Encounter (Signed)
Ok to take ibuprofen 600 mg (three OTC pills) 3 times a day  If she prefers we can call in the 600 mg prescription strength.  #90  #3

## 2015-06-30 ENCOUNTER — Ambulatory Visit (INDEPENDENT_AMBULATORY_CARE_PROVIDER_SITE_OTHER): Payer: 59

## 2015-06-30 DIAGNOSIS — R51 Headache: Secondary | ICD-10-CM

## 2015-06-30 DIAGNOSIS — R569 Unspecified convulsions: Secondary | ICD-10-CM

## 2015-06-30 DIAGNOSIS — R519 Headache, unspecified: Secondary | ICD-10-CM

## 2015-06-30 MED ORDER — GADOPENTETATE DIMEGLUMINE 469.01 MG/ML IV SOLN: 16.0000 mL | Freq: Once | INTRAVENOUS | Status: DC | PRN

## 2015-07-05 ENCOUNTER — Telehealth: Payer: Self-pay | Admitting: *Deleted

## 2015-07-05 ENCOUNTER — Ambulatory Visit: Payer: 59 | Admitting: Neurology

## 2015-07-05 NOTE — Telephone Encounter (Signed)
-----   Message from Britt Bottom, MD sent at 06/25/2015  4:44 PM EDT ----- Labs are fine. We will call with the MRI results after they are done.

## 2015-07-05 NOTE — Telephone Encounter (Signed)
I have spoken with Angel Rodriguez today, and per RAS, advised that mri brain shows 2 small spots, that these types of spots are sometimes seen in people with migraine headaches.  They do not appear to be brand new, but RAS would like to recheck an mri brain in about 6 mos to make sure there are not more spots.  The veins in the brain are normal.  She verbalized understanding of same.  Sts. she would like to discuss spheno block and possibly have this done.  I offered appt. this afternoon, but she has an appt. on Thursday and will discuss at that time./fim

## 2015-07-05 NOTE — Telephone Encounter (Signed)
I have spoken with Angel Rodriguez today and per RAS, advised that labs were fine.  She verbalized understanding of same/fim

## 2015-07-05 NOTE — Telephone Encounter (Signed)
-----   Message from Britt Bottom, MD sent at 07/03/2015  3:34 PM EDT ----- Please let her know that the MRI of the brain shows 2 small spots. We sometimes see these in people with migraine headaches. They do not appear to be brand new. I will likely want to recheck an MRI in about 6 months to make sure that she is not getting more of these.  The MRI of the veins in the brain is normal.  If she is continuing to have severe headaches, thus bring her in for a sphenopalatine ganglion block.

## 2015-07-08 ENCOUNTER — Ambulatory Visit (INDEPENDENT_AMBULATORY_CARE_PROVIDER_SITE_OTHER): Payer: 59 | Admitting: Neurology

## 2015-07-08 ENCOUNTER — Telehealth: Payer: Self-pay | Admitting: Neurology

## 2015-07-08 ENCOUNTER — Encounter: Payer: Self-pay | Admitting: Neurology

## 2015-07-08 VITALS — BP 118/82 | HR 64 | Resp 14 | Ht 60.0 in | Wt 166.5 lb

## 2015-07-08 DIAGNOSIS — R569 Unspecified convulsions: Secondary | ICD-10-CM | POA: Diagnosis not present

## 2015-07-08 DIAGNOSIS — R51 Headache: Secondary | ICD-10-CM | POA: Diagnosis not present

## 2015-07-08 DIAGNOSIS — H348192 Central retinal vein occlusion, unspecified eye, stable: Secondary | ICD-10-CM

## 2015-07-08 DIAGNOSIS — G43719 Chronic migraine without aura, intractable, without status migrainosus: Secondary | ICD-10-CM | POA: Insufficient documentation

## 2015-07-08 DIAGNOSIS — F411 Generalized anxiety disorder: Secondary | ICD-10-CM | POA: Diagnosis not present

## 2015-07-08 DIAGNOSIS — F419 Anxiety disorder, unspecified: Secondary | ICD-10-CM | POA: Insufficient documentation

## 2015-07-08 DIAGNOSIS — G43711 Chronic migraine without aura, intractable, with status migrainosus: Secondary | ICD-10-CM

## 2015-07-08 DIAGNOSIS — R519 Headache, unspecified: Secondary | ICD-10-CM | POA: Insufficient documentation

## 2015-07-08 MED ORDER — CLONAZEPAM 0.5 MG PO TABS: 0.5000 mg | ORAL_TABLET | Freq: Two times a day (BID) | ORAL | Status: DC | PRN

## 2015-07-08 NOTE — Telephone Encounter (Signed)
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 She needs a note to write her out for another week.

## 2015-07-08 NOTE — Progress Notes (Signed)
[facility]  PATIENT: Angel Rodriguez DOB: Jun 26, 1975  REFERRING DOCTOR OR PCP:   SOURCE: patient, ED records, images on PACS, reports in EMR  _________________________________   HISTORICAL  CHIEF COMPLAINT:  Chief Complaint  Patient presents with  . Headache    Sts. she was unable to tolerate Lexapro--feels it caused anxiety--she would like to discuss Clonazepam.   Sts. she gets good relief of h/a with Imitrex.  Sts. h/a is 90% improved since last ov.  She would like to discuss sphenocath for additional relief/fim    HISTORY OF PRESENT ILLNESS:  Angel Rodriguez is a 40 year old woman who has had daily headaches since January 2017.   Since starting Verapamil,   HA is better but not resolved.  It is still 24/7.   She gets a benefit from the Imitrex when HA is more severe.    There is still a constant throbbing pain and she still gets conjunctival injection changes intermittently every day (though better).   Pain is entirely on the right. Indomethacin did not help the pain much.    In An MRI of the brain was essentially normal with just a single small T2/FLAIR hyperintense focus in the left frontal subcortical white matter. The MR venogram was normal. ESR and ANA were normal.    HA History:   The headaches began, they were more intermittent. They have always been on the right side of her head and the character of the pain will change with intensity becoming more pounding when it is more severe.  With the pain becomes more severe, she has phonophobia, photophobia and moving her head will worsen the pain. She does not really get much nausea and has not had vomiting. She has taken many over-the-counter medications, mostly ibuprofen. Initially, taking ibuprofen helped the headache a lot. However, more recently, when she takes ibuprofen she feels a little bit better for a while and then the headache intensifies again. She has started to take Excedrin Migraine. She is taking the  over-the-counter medication multiple times every day.   There is no aura.  Occasionally, headache has awakened her at night.  She went to the urgent care for 12/24/2015. She had a shot of 60 mg Toradol and 10 mg Decadron and was given a prescription for indomethacin. She did not think that the indomethacin helped anymore than the other anti-inflammatories.  Mood:  She is also reporting that her anxiety is still a big problem despite the Lexapro. We discussed some other options.  She feels that the Lexapro   helped the depression a bit.  CRVO:    About 8 years ago, she lost vision in the right eye. I was able to pull up some records from [facility]. 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 Because of the edema, she was getting injections of steroid in the past.  Seizure:   In March 2016, she had a seizure. A CAT scan did not show any significant abnormality. She had not followed up with neurology after the seizure. She had an aura of numbness in her right hand and then passed out.   She is not sure there was any generalized tonic-clonic activity. Her children did not tell her that she had any. However, she did have urinary incontinence. She does not remember much that day.    Since then, she has not had any more seizures.   I  personally reviewed the CT scan of the head from  05/30/2014. It is essentially normal. There is a small retention cyst in the right maxillary sinus.  REVIEW OF SYSTEMS: Constitutional: No fevers, chills, sweats, or change in appetite Eyes: as above Ear, nose and throat: No hearing loss, ear pain, nasal congestion, sore throat Cardiovascular: No chest pain, palpitations Respiratory: No shortness of breath at rest or with exertion.   No wheezes GastrointestinaI: No nausea, vomiting, diarrhea, abdominal pain, fecal incontinence Genitourinary: No dysuria, urinary retention or frequency.  No nocturia. Musculoskeletal: No neck pain,  back pain Integumentary: No rash, pruritus, skin lesions Neurological: as above Psychiatric: No depression at this time.  No anxiety Endocrine: No palpitations, diaphoresis, change in appetite, change in weigh or increased thirst Hematologic/Lymphatic: No anemia, purpura, petechiae. Allergic/Immunologic: No itchy/runny eyes, nasal congestion, recent allergic reactions, rashes  ALLERGIES: Allergies  Allergen Reactions  . Ivp Dye [Iodinated Diagnostic Agents] Hives, Itching and Swelling  . Bee Venom Swelling    HOME MEDICATIONS:  Current outpatient prescriptions:  .  brimonidine (ALPHAGAN P) 0.1 % SOLN, , Disp: , Rfl:  .  Carboxymethylcellul-Glycerin (LUBRICATING EYE DROPS OP), Apply 1-2 drops to eye as needed (dry eyes)., Disp: , Rfl:  .  Cyanocobalamin (VITAMIN B-12 PO), Take by mouth., Disp: , Rfl:  .  dorzolamide-timolol (COSOPT) 22.3-6.8 MG/ML ophthalmic solution, , Disp: , Rfl:  .  escitalopram (LEXAPRO) 10 MG tablet, Take 1 tablet (10 mg total) by mouth daily., Disp: 30 tablet, Rfl: 11 .  fluticasone (FLONASE) 50 MCG/ACT nasal spray, Place 1-2 sprays into both nostrils daily., Disp: , Rfl:  .  ibuprofen (ADVIL,MOTRIN) 200 MG tablet, Take 600-800 mg by mouth every 6 (six) hours as needed., Disp: , Rfl:  .  indomethacin (INDOCIN) 50 MG capsule, Take 1 capsule (50 mg total) by mouth 2 (two) times daily with a meal., Disp: 20 capsule, Rfl: 0 .  Multiple Vitamins-Minerals (MULTIVITAMIN WITH MINERALS) tablet, Take 1 tablet by mouth daily., Disp: , Rfl:  .  SUMAtriptan (IMITREX) 100 MG tablet, Take 1 tablet (100 mg total) by mouth once as needed for migraine. May repeat in 2 hours if headache persists or recurs., Disp: 10 tablet, Rfl: 2 .  verapamil (CALAN-SR) 240 MG CR tablet, Take 1 tablet (240 mg total) by mouth at bedtime., Disp: 30 tablet, Rfl: 2 No current facility-administered medications for this visit.  Facility-Administered Medications Ordered in Other Visits:  .   gadopentetate dimeglumine (MAGNEVIST) injection 16 mL, 16 mL, Intravenous, Once PRN, Britt Bottom, MD  PAST MEDICAL HISTORY: Past Medical History  Diagnosis Date  . BV (bacterial vaginosis)   . Fibroids   . Trichomonas   . Menorrhagia   . Dysmenorrhea   . Yeast infection   . Pneumonia   . Seasonal allergies     PAST SURGICAL HISTORY: Past Surgical History  Procedure Laterality Date  . Hysteroscopy w/d&c    . Ablation    . Tubal ligation    . Wisdom tooth extraction    . Eye surgery      FAMILY HISTORY: Family History  Problem Relation Age of Onset  . Cancer Maternal Grandmother     unsure of type  . GER disease Mother   . Alcoholism Father   . Cirrhosis Father     SOCIAL HISTORY:  Social History   Social History  . Marital Status: Single    Spouse Name: N/A  . Number of Children: N/A  . Years of Education: N/A   Occupational History  . Not on file.  Social History Main Topics  . Smoking status: Current Some Day Smoker  . Smokeless tobacco: Never Used  . Alcohol Use: Yes     Comment: occasional  . Drug Use: No  . Sexual Activity: Not on file     Comment: btl   Other Topics Concern  . Not on file   Social History Narrative     PHYSICAL EXAM  Filed Vitals:   07/08/15 0913  BP: 118/82  Pulse: 64  Resp: 14  Height: 5' (1.524 m)  Weight: 166 lb 8 oz (75.524 kg)    Body mass index is 32.52 kg/(m^2).   General: The patient is well-developed and well-nourished and in no acute distress  Eyes:  Funduscopic exam shows normal left optic discs and retinal vessels.  Right eye has a fixed pupil and discerned retinal details.    There is conjunctival redness on the right.  Neck: The neck is supple.  The neck is very tender over right occiput  Musculoskeletal:  Back is nontender  Neurologic Exam  Mental status: The patient is alert and oriented x 3 at the time of the examination. The patient has apparent normal recent and remote memory, with  an apparently normal attention span and concentration ability.   Speech is normal.  Cranial nerves: Extraocular movements are full. Left pupil reacts to light and accomodation.   Facial symmetry is present except minimal right ptosis. There is slight reduced right facial sensation to soft touch bilaterally.Facial strength is normal.  Trapezius and sternocleidomastoid strength is normal. No dysarthria is noted.  The tongue is midline, and the patient has symmetric elevation of the soft palate. No obvious hearing deficits are noted.  Motor:  Muscle bulk is normal.   Tone is normal. Strength is  5 / 5 in all 4 extremities.   Sensory: Sensory testing is intact to touch and vibration sensation in all 4 extremities.  Coordination: Cerebellar testing reveals good finger-nose-finger bilaterally.  Gait and station: Station is normal.   Gait is normal. Tandem gait is normal. Romberg is negative.   Reflexes: Deep tendon reflexes are symmetric and normal bilaterally.        DIAGNOSTIC DATA (LABS, IMAGING, TESTING) - I reviewed patient records, labs, notes, testing and imaging myself where available.  Lab Results  Component Value Date   WBC 11.3* 05/30/2014   HGB 15.0 05/30/2014   HCT 44.2 05/30/2014   MCV 89.5 05/30/2014   PLT 99* 05/30/2014      Component Value Date/Time   NA 140 05/30/2014 1215   K 5.1 05/30/2014 1215   CL 111 05/30/2014 1215   CO2 22 05/30/2014 1215   GLUCOSE 113* 05/30/2014 1215   BUN 9 05/30/2014 1215   CREATININE 0.74 05/30/2014 1215   CALCIUM 9.6 05/30/2014 1215   PROT 7.3 05/30/2014 1215   ALBUMIN 4.0 05/30/2014 1215   AST 40* 05/30/2014 1215   ALT 31 05/30/2014 1215   ALKPHOS 57 05/30/2014 1215   BILITOT 0.6 05/30/2014 1215   GFRNONAA >90 05/30/2014 1215   GFRAA >90 05/30/2014 1215    _______________________________________ PROCEDURE  Procedure: The patient was placed in the supine position. A temperature strip was added to the cheek area after the area  was cleaned with alcohol. The Sphenocath was lubricated with gel, and placed in the right naris. The catheter was inserted above the middle turbinate to the posterior nasal cavity, and then withdrawn 1 cm. The catheter was deployed and rotated approximately 20 towards the nose. 2-1/2  mL of 2% lidocaine was deployed. The patient was asked to swallow during the injection. The patient demonstrated erythema of the sclera of the eye on this side, and an increase in the cheek temperature was noted from 34C to [facility].  The patient tolerated the procedure well. No complications of the procedure were noted. The patient was kept in the supine position for 8 minutes following the procedure. She was given small sips of water after sitting up following the procedure.      ASSESSMENT AND PLAN  Intractable chronic migraine without aura and with status migrainosus  Convulsions, unspecified convulsion type (HCC)  Intractable headache  Central retinal vein occlusion  Anxiety state    1.   Continue prn Imitrex.   Continue Calan SR 2.   Sphenocath procedure on the right ---  Pain was improved afterwards. 3.   If not better on Monday, she will give [place] a call back.  If partially better, consider a second sputum Procedure Sphenocath procedure. 4.    Add clonazepam 0.5 mg twice a day when necessary for anxiety. Continue Lexapro.  She will return in 3 months or sooner if there are new or worsening symptoms.  Armaan Pond A. Felecia Shelling, MD, PhD 03/06/6577, 0:38 AM Certified in Neurology, Clinical Neurophysiology, Sleep Medicine, Pain Medicine and Neuroimaging  [facility] [address] [place], [place] [address] [phone]

## 2015-07-09 NOTE — Telephone Encounter (Signed)
I have spoken with Angel Rodriguez is requesting a work note for the last 2 weeks, and for next week as well.  This has not been discussed during her office visits, and office notes do not support a 3 wk. abscence from work.  At yesterday's ov, she reported h/a was 90% better.  She verbalized understanding of same/fim

## 2015-07-13 ENCOUNTER — Telehealth: Payer: Self-pay | Admitting: Neurology

## 2015-07-13 ENCOUNTER — Encounter: Payer: Self-pay | Admitting: Neurology

## 2015-07-13 ENCOUNTER — Ambulatory Visit (INDEPENDENT_AMBULATORY_CARE_PROVIDER_SITE_OTHER): Payer: 59 | Admitting: Neurology

## 2015-07-13 VITALS — BP 110/64 | HR 68 | Resp 16 | Ht 60.0 in | Wt 166.0 lb

## 2015-07-13 DIAGNOSIS — R569 Unspecified convulsions: Secondary | ICD-10-CM

## 2015-07-13 DIAGNOSIS — F411 Generalized anxiety disorder: Secondary | ICD-10-CM

## 2015-07-13 DIAGNOSIS — G43711 Chronic migraine without aura, intractable, with status migrainosus: Secondary | ICD-10-CM | POA: Diagnosis not present

## 2015-07-13 DIAGNOSIS — M542 Cervicalgia: Secondary | ICD-10-CM

## 2015-07-13 DIAGNOSIS — R51 Headache: Secondary | ICD-10-CM

## 2015-07-13 DIAGNOSIS — R519 Headache, unspecified: Secondary | ICD-10-CM

## 2015-07-13 NOTE — Telephone Encounter (Signed)
Per last ov note, may repeat sphenocath.  She sts. she is some better, would like 2nd sphenocath.  Appt. this afternoon given/fim

## 2015-07-13 NOTE — Progress Notes (Signed)
[facility]  PATIENT: Angel Rodriguez DOB: 11-06-1975  REFERRING DOCTOR OR PCP:   SOURCE: patient, ED records, images on PACS, reports in EMR  _________________________________   HISTORICAL  CHIEF COMPLAINT:  Chief Complaint  Patient presents with  . Headache    Sts. at this time, h/a is completely resolved.  Sts. she doesn't feel clonazepam is not strong enough/fim    HISTORY OF PRESENT ILLNESS:  Angel Rodriguez is a 39 year old woman who has had daily headaches since January 2017.   Since starting Verapamil,   HA is better after occipital nerve block/splenius capitis muscle injection at the first visit and the [place] at the second visit but has not resolved.  It is still 24/7. She gets right eye redness when pain is more severe.    She also gets a benefit from the Imitrex when HA is more severe, but benefit is temporary.        Pain is entirely on the right. Indomethacin did not help the pain much.    An MRI of the brain was essentially normal with just a single small T2/FLAIR hyperintense focus in the left frontal subcortical white matter. The MR venogram was normal. ESR and ANA were normal.   HA History:   The headaches began, they were more intermittent. They have always been on the right side of her head and the character of the pain will change with intensity becoming more pounding when it is more severe.  With the pain becomes more severe, she has phonophobia, photophobia and moving her head will worsen the pain. She does not really get much nausea and has not had vomiting. She has taken many over-the-counter medications, mostly ibuprofen. Initially, taking ibuprofen helped the headache a lot. However, more recently, when she takes ibuprofen she feels a little bit better for a while and then the headache intensifies again. She has started to take Excedrin Migraine. She is taking the over-the-counter medication multiple times every day.   There is no aura.   Occasionally, headache has awakened her at night.  She went to the urgent care for 12/24/2015. She had a shot of 60 mg Toradol and 10 mg Decadron and was given a prescription for indomethacin. She did not think that the indomethacin helped anymore than the other anti-inflammatories.  Mood:  She is also reporting that her anxiety is still a big problem despite the Lexapro. We discussed some other options.  She feels that the Lexapro   helped the depression a bit.   We started clonazepam and it has not helped much.     Seizure:   In March 2016, she had a seizure.   She had an aura of numbness in her right hand and then passed out.   She is not sure there was any generalized tonic-clonic activity. Her children did not tell her that she had any. However, she did have urinary incontinence. She does not remember much that day.    Since then, she has not had any more seizures.   I  personally reviewed the CT scan of the head from 05/30/2014. It is essentially normal.    REVIEW OF SYSTEMS: Constitutional: No fevers, chills, sweats, or change in appetite Eyes: as above Ear, nose and throat: No hearing loss, ear pain, nasal congestion, sore throat Cardiovascular: No chest pain, palpitations Respiratory: No shortness of breath at rest or with exertion.   No wheezes GastrointestinaI: No nausea, vomiting, diarrhea, abdominal pain, fecal incontinence Genitourinary: No dysuria,  urinary retention or frequency.  No nocturia. Musculoskeletal: No neck pain, back pain Integumentary: No rash, pruritus, skin lesions Neurological: as above Psychiatric: No depression at this time.  No anxiety Endocrine: No palpitations, diaphoresis, change in appetite, change in weigh or increased thirst Hematologic/Lymphatic: No anemia, purpura, petechiae. Allergic/Immunologic: No itchy/runny eyes, nasal congestion, recent allergic reactions, rashes  ALLERGIES: Allergies  Allergen Reactions  . Ivp Dye [Iodinated Diagnostic  Agents] Hives, Itching and Swelling  . Bee Venom Swelling    HOME MEDICATIONS:  Current outpatient prescriptions:  .  brimonidine (ALPHAGAN P) 0.1 % SOLN, , Disp: , Rfl:  .  Carboxymethylcellul-Glycerin (LUBRICATING EYE DROPS OP), Apply 1-2 drops to eye as needed (dry eyes)., Disp: , Rfl:  .  clonazePAM (KLONOPIN) 0.5 MG tablet, Take 1 tablet (0.5 mg total) by mouth 2 (two) times daily as needed for anxiety., Disp: 60 tablet, Rfl: 3 .  Cyanocobalamin (VITAMIN B-12 PO), Take by mouth., Disp: , Rfl:  .  dorzolamide-timolol (COSOPT) 22.3-6.8 MG/ML ophthalmic solution, , Disp: , Rfl:  .  escitalopram (LEXAPRO) 10 MG tablet, Take 1 tablet (10 mg total) by mouth daily., Disp: 30 tablet, Rfl: 11 .  fluticasone (FLONASE) 50 MCG/ACT nasal spray, Place 1-2 sprays into both nostrils daily., Disp: , Rfl:  .  ibuprofen (ADVIL,MOTRIN) 200 MG tablet, Take 600-800 mg by mouth every 6 (six) hours as needed., Disp: , Rfl:  .  indomethacin (INDOCIN) 50 MG capsule, Take 1 capsule (50 mg total) by mouth 2 (two) times daily with a meal., Disp: 20 capsule, Rfl: 0 .  Multiple Vitamins-Minerals (MULTIVITAMIN WITH MINERALS) tablet, Take 1 tablet by mouth daily., Disp: , Rfl:  .  SUMAtriptan (IMITREX) 100 MG tablet, Take 1 tablet (100 mg total) by mouth once as needed for migraine. May repeat in 2 hours if headache persists or recurs., Disp: 10 tablet, Rfl: 2 .  verapamil (CALAN-SR) 240 MG CR tablet, Take 1 tablet (240 mg total) by mouth at bedtime., Disp: 30 tablet, Rfl: 2 No current facility-administered medications for this visit.  Facility-Administered Medications Ordered in Other Visits:  .  gadopentetate dimeglumine (MAGNEVIST) injection 16 mL, 16 mL, Intravenous, Once PRN, Britt Bottom, MD  PAST MEDICAL HISTORY: Past Medical History  Diagnosis Date  . BV (bacterial vaginosis)   . Fibroids   . Trichomonas   . Menorrhagia   . Dysmenorrhea   . Yeast infection   . Pneumonia   . Seasonal allergies      PAST SURGICAL HISTORY: Past Surgical History  Procedure Laterality Date  . Hysteroscopy w/d&c    . Ablation    . Tubal ligation    . Wisdom tooth extraction    . Eye surgery      FAMILY HISTORY: Family History  Problem Relation Age of Onset  . Cancer Maternal Grandmother     unsure of type  . GER disease Mother   . Alcoholism Father   . Cirrhosis Father     SOCIAL HISTORY:  Social History   Social History  . Marital Status: Single    Spouse Name: N/A  . Number of Children: N/A  . Years of Education: N/A   Occupational History  . Not on file.   Social History Main Topics  . Smoking status: Current Some Day Smoker  . Smokeless tobacco: Never Used  . Alcohol Use: Yes     Comment: occasional  . Drug Use: No  . Sexual Activity: Not on file     Comment: btl  Other Topics Concern  . Not on file   Social History Narrative     PHYSICAL EXAM  Filed Vitals:   07/13/15 1557  BP: 110/64  Pulse: 68  Resp: 16  Height: 5' (1.524 m)  Weight: 166 lb (75.297 kg)    Body mass index is 32.42 kg/(m^2).   General: The patient is well-developed and well-nourished and in no acute distress  Eyes:    There is conjunctival redness on the right.  Neck: The neck is supple.  The neck is very tender over right occiput  Musculoskeletal:  Back is nontender  Neurologic Exam  Mental status: The patient is alert and oriented x 3 at the time of the examination. The patient has apparent normal recent and remote memory, with an apparently normal attention span and concentration ability.   Speech is normal.  Cranial nerves: Extraocular movements are full. Left pupil reacts to light and accomodation.   Facial symmetry is present except minimal right ptosis. There is normal facial sensation to soft touch.  Facial strength is normal.  Trapezius and sternocleidomastoid strength is normal. No dysarthria is noted.  The tongue is midline, and the patient has symmetric elevation of  the soft palate. No obvious hearing deficits are noted.  Motor:  Muscle bulk is normal.   Tone is normal. Strength is  5 / 5 in all 4 extremities.    Gait and station: Station is normal.   Gait is normal. Tandem gait is normal. Romberg is negative.       DIAGNOSTIC DATA (LABS, IMAGING, TESTING) - I reviewed patient records, labs, notes, testing and imaging myself where available.  Lab Results  Component Value Date   WBC 11.3* 05/30/2014   HGB 15.0 05/30/2014   HCT 44.2 05/30/2014   MCV 89.5 05/30/2014   PLT 99* 05/30/2014      Component Value Date/Time   NA 140 05/30/2014 1215   K 5.1 05/30/2014 1215   CL 111 05/30/2014 1215   CO2 22 05/30/2014 1215   GLUCOSE 113* 05/30/2014 1215   BUN 9 05/30/2014 1215   CREATININE 0.74 05/30/2014 1215   CALCIUM 9.6 05/30/2014 1215   PROT 7.3 05/30/2014 1215   ALBUMIN 4.0 05/30/2014 1215   AST 40* 05/30/2014 1215   ALT 31 05/30/2014 1215   ALKPHOS 57 05/30/2014 1215   BILITOT 0.6 05/30/2014 1215   GFRNONAA >90 05/30/2014 1215   GFRAA >90 05/30/2014 1215    _______________________________________ PROCEDURE  Procedure: The patient was placed in the supine position. A temperature strip was added to the cheek area after the area was cleaned with alcohol. The Sphenocath was lubricated with gel, and placed in the right naris. The catheter was inserted above the middle turbinate to the posterior nasal cavity, and then withdrawn 1 cm. The catheter was deployed and rotated approximately 20 towards the nose. 2-1/2 mL of 2% lidocaine was deployed. The patient was asked to swallow during the injection. The patient demonstrated erythema of the sclera of the eye on this side, and an increase in the cheek temperature was noted from 33C to 35C.  The patient tolerated the procedure well. No complications of the procedure were noted. The patient was kept in the supine position for 3 minutes following the procedure. She was given small sips of water after  sitting up following the procedure.      ASSESSMENT AND PLAN  Intractable headache  Intractable chronic migraine without aura and with status migrainosus  Convulsions, unspecified convulsion type (  [place])  Central retinal vein occlusion    1.    Sphenocath procedure on the right ---  Pain was improved afterwards. 2.    Right splenius capitis muscle trigger point injection with 80 mg Depo-Medrol in [place].   She tolerated the procedure well.    3.    Continue clonazepam 0.5 mg twice a day for anxiety. Continue Lexapro.   If anxiety still a problem after pain improves, consider referral to Psych.    She will return in 3 months or sooner if there are new or worsening symptoms.  Richard A. Felecia Shelling, MD, PhD 06/04/5828, 9:40 PM Certified in Neurology, Clinical Neurophysiology, Sleep Medicine, Pain Medicine and Neuroimaging  [facility] [address], [place], [place] [address] [phone]

## 2015-07-13 NOTE — Telephone Encounter (Signed)
Patient is calling. She had a Sphenocath procedure last Thursday in our office and still has redness and swelling. Please call to discuss.

## 2015-07-28 ENCOUNTER — Ambulatory Visit (INDEPENDENT_AMBULATORY_CARE_PROVIDER_SITE_OTHER): Payer: 59 | Admitting: Neurology

## 2015-07-28 ENCOUNTER — Encounter: Payer: Self-pay | Admitting: Neurology

## 2015-07-28 VITALS — BP 132/84 | HR 62 | Resp 18 | Ht 60.0 in | Wt 171.5 lb

## 2015-07-28 DIAGNOSIS — M542 Cervicalgia: Secondary | ICD-10-CM | POA: Diagnosis not present

## 2015-07-28 DIAGNOSIS — H35359 Cystoid macular degeneration, unspecified eye: Secondary | ICD-10-CM

## 2015-07-28 DIAGNOSIS — G43711 Chronic migraine without aura, intractable, with status migrainosus: Secondary | ICD-10-CM

## 2015-07-28 DIAGNOSIS — R569 Unspecified convulsions: Secondary | ICD-10-CM

## 2015-07-28 NOTE — Progress Notes (Signed)
[facility]  PATIENT: Angel Rodriguez DOB: August 06, 1975  REFERRING DOCTOR OR PCP:   SOURCE: patient, ED records, images on PACS, reports in EMR  _________________________________   HISTORICAL  CHIEF COMPLAINT:  Chief Complaint  Patient presents with  . Headache    Sts. she had an episode this morning or pressure feeling above right eye, and tearing from right eye.  Sts. she took 2 Excedrin migraine and 3 Ibuprofen and h/a resolved.  She would like to discuss another sphenocath./fim    HISTORY OF PRESENT ILLNESS:  Angel Rodriguez is a 40 year old woman who has had daily headaches since January 2017.   Since starting Verapamil and having occipital nerve block/splenius capitis muscle injection at the first visit and the [place] at the second and third visits, pain has done better.   After the second one, the conjunctival redness cleared up until today when it returned along with a headache.     The right sided HA improved with OTC med's but th redness persists.  Indomethacin did not help the pain much.   Earlier this year, an MRI of the brain was essentially normal with just a single small T2/FLAIR hyperintense focus in the left frontal subcortical white matter. The MR venogram was normal. ESR and ANA were normal.    Mood:  Clonazepam has helped anxiety much better than the escitalopram.    Seizure:   In March 2016, she had a seizure.   She had an aura of numbness in her right hand and then passed out.   She is not sure there was any generalized tonic-clonic activity. Her children did not tell her that she had any. However, she did have urinary incontinence. She does not remember much that day.    Since then, she has not had any more seizures.  She has not needed any AED as she had only the one seizure/spell.       REVIEW OF SYSTEMS: Constitutional: No fevers, chills, sweats, or change in appetite Eyes: as above Ear, nose and throat: No hearing loss, ear pain, nasal  congestion, sore throat Cardiovascular: No chest pain, palpitations Respiratory: No shortness of breath at rest or with exertion.   No wheezes GastrointestinaI: No nausea, vomiting, diarrhea, abdominal pain, fecal incontinence Genitourinary: No dysuria, urinary retention or frequency.  No nocturia. Musculoskeletal: No neck pain, back pain Integumentary: No rash, pruritus, skin lesions Neurological: as above Psychiatric: No depression at this time.  Notes mild anxiety, better on clonazepam Endocrine: No palpitations, diaphoresis, change in appetite, change in weigh or increased thirst Hematologic/Lymphatic: No anemia, purpura, petechiae. Allergic/Immunologic: No itchy/runny eyes, nasal congestion, recent allergic reactions, rashes  ALLERGIES: Allergies  Allergen Reactions  . Ivp Dye [Iodinated Diagnostic Agents] Hives, Itching and Swelling  . Bee Venom Swelling    HOME MEDICATIONS:  Current outpatient prescriptions:  .  brimonidine (ALPHAGAN P) 0.1 % SOLN, , Disp: , Rfl:  .  Carboxymethylcellul-Glycerin (LUBRICATING EYE DROPS OP), Apply 1-2 drops to eye as needed (dry eyes)., Disp: , Rfl:  .  clonazePAM (KLONOPIN) 0.5 MG tablet, Take 1 tablet (0.5 mg total) by mouth 2 (two) times daily as needed for anxiety., Disp: 60 tablet, Rfl: 3 .  Cyanocobalamin (VITAMIN B-12 PO), Take by mouth., Disp: , Rfl:  .  dorzolamide-timolol (COSOPT) 22.3-6.8 MG/ML ophthalmic solution, , Disp: , Rfl:  .  escitalopram (LEXAPRO) 10 MG tablet, Take 1 tablet (10 mg total) by mouth daily., Disp: 30 tablet, Rfl: 11 .  fluticasone (FLONASE) 50  MCG/ACT nasal spray, Place 1-2 sprays into both nostrils daily., Disp: , Rfl:  .  ibuprofen (ADVIL,MOTRIN) 200 MG tablet, Take 600-800 mg by mouth every 6 (six) hours as needed., Disp: , Rfl:  .  indomethacin (INDOCIN) 50 MG capsule, Take 1 capsule (50 mg total) by mouth 2 (two) times daily with a meal., Disp: 20 capsule, Rfl: 0 .  Multiple Vitamins-Minerals  (MULTIVITAMIN WITH MINERALS) tablet, Take 1 tablet by mouth daily., Disp: , Rfl:  .  SUMAtriptan (IMITREX) 100 MG tablet, Take 1 tablet (100 mg total) by mouth once as needed for migraine. May repeat in 2 hours if headache persists or recurs., Disp: 10 tablet, Rfl: 2 .  verapamil (CALAN-SR) 240 MG CR tablet, Take 1 tablet (240 mg total) by mouth at bedtime., Disp: 30 tablet, Rfl: 2 No current facility-administered medications for this visit.  Facility-Administered Medications Ordered in Other Visits:  .  gadopentetate dimeglumine (MAGNEVIST) injection 16 mL, 16 mL, Intravenous, Once PRN, Britt Bottom, MD  PAST MEDICAL HISTORY: Past Medical History  Diagnosis Date  . BV (bacterial vaginosis)   . Fibroids   . Trichomonas   . Menorrhagia   . Dysmenorrhea   . Yeast infection   . Pneumonia   . Seasonal allergies     PAST SURGICAL HISTORY: Past Surgical History  Procedure Laterality Date  . Hysteroscopy w/d&c    . Ablation    . Tubal ligation    . Wisdom tooth extraction    . Eye surgery      FAMILY HISTORY: Family History  Problem Relation Age of Onset  . Cancer Maternal Grandmother     unsure of type  . GER disease Mother   . Alcoholism Father   . Cirrhosis Father     SOCIAL HISTORY:  Social History   Social History  . Marital Status: Single    Spouse Name: N/A  . Number of Children: N/A  . Years of Education: N/A   Occupational History  . Not on file.   Social History Main Topics  . Smoking status: Current Some Day Smoker  . Smokeless tobacco: Never Used  . Alcohol Use: Yes     Comment: occasional  . Drug Use: No  . Sexual Activity: Not on file     Comment: btl   Other Topics Concern  . Not on file   Social History Narrative     PHYSICAL EXAM  Filed Vitals:   07/28/15 1309  BP: 132/84  Pulse: 62  Resp: 18  Height: 5' (1.524 m)  Weight: 171 lb 8 oz (77.792 kg)    Body mass index is 33.49 kg/(m^2).   General: The patient is  well-developed and well-nourished and in no acute distress  Eyes:    There is conjunctival redness on the right.  Neck: The neck is supple.  The neck is very tender over right occiput  Musculoskeletal:  Back is nontender  Neurologic Exam  Mental status: The patient is alert and oriented x 3 at the time of the examination. The patient has apparent normal recent and remote memory, with an apparently normal attention span and concentration ability.   Speech is normal.  Cranial nerves: Extraocular movements are full. Left pupil reacts to light and accomodation.   Facial symmetry is present except mild right ptosis. There is normal facial sensation to soft touch.  Facial strength is normal.  Trapezius and sternocleidomastoid strength is normal. No dysarthria is noted.  The tongue is midline, and the  patient has symmetric elevation of the soft palate. No obvious hearing deficits are noted.  Motor:  Muscle bulk is normal.   Tone is normal. Strength is  5 / 5 in all 4 extremities.    Gait and station: Station is normal.   Gait is normal. Tandem gait is normal. Romberg is negative.       DIAGNOSTIC DATA (LABS, IMAGING, TESTING) - I reviewed patient records, labs, notes, testing and imaging myself where available.  Lab Results  Component Value Date   WBC 11.3* 05/30/2014   HGB 15.0 05/30/2014   HCT 44.2 05/30/2014   MCV 89.5 05/30/2014   PLT 99* 05/30/2014      Component Value Date/Time   NA 140 05/30/2014 1215   K 5.1 05/30/2014 1215   CL 111 05/30/2014 1215   CO2 22 05/30/2014 1215   GLUCOSE 113* 05/30/2014 1215   BUN 9 05/30/2014 1215   CREATININE 0.74 05/30/2014 1215   CALCIUM 9.6 05/30/2014 1215   PROT 7.3 05/30/2014 1215   ALBUMIN 4.0 05/30/2014 1215   AST 40* 05/30/2014 1215   ALT 31 05/30/2014 1215   ALKPHOS 57 05/30/2014 1215   BILITOT 0.6 05/30/2014 1215   GFRNONAA >90 05/30/2014 1215   GFRAA >90 05/30/2014 1215     _______________________________________ PROCEDURE  Procedure: The patient was placed in the supine position. A temperature strip was added to the cheek area after the area was cleaned with alcohol. The Sphenocath was lubricated with gel, and placed in the right naris. The catheter was inserted above the middle turbinate to the posterior nasal cavity, and then withdrawn 1 cm. The catheter was deployed and rotated approximately 20 towards the nose. 2-1/2 mL of 4% lidocaine was deployed. The patient was asked to swallow during the injection. The patient demonstrated erythema of the sclera of the eye on this side, and an increase in the cheek temperature was noted from 33.5C to 35C.  The patient tolerated the procedure well. No complications of the procedure were noted. The patient was kept in the supine position for 3 minutes following the procedure. She was given small sips of water after sitting up following the procedure.    She tolerated it well     ASSESSMENT AND PLAN  Intractable chronic migraine without aura and with status migrainosus  Convulsions, unspecified convulsion type ([place])  Neck pain  Central retinal edema, cystoid, unspecified laterality    1.   Sphenocath procedure on the right to target right SPG and right trigeminal nerves---  Pain was improved afterwards.   She tolerated the procedure well 2.    Right splenius capitis muscle trigger point injection with 80 mg Depo-Medrol in [place].   She tolerated the procedure well and there were no complications 3.    Continue clonazepam 0.5 mg twice a day for anxiety.     She will return in 3 months or sooner if there are new or worsening symptoms.  Tonatiuh Mallon A. Felecia Shelling, MD, PhD 5/39/7673, 4:19 PM Certified in Neurology, Clinical Neurophysiology, Sleep Medicine, Pain Medicine and Neuroimaging  [facility] [address] [place], [place] [address] [phone] [phone]

## 2015-08-16 ENCOUNTER — Telehealth: Payer: Self-pay | Admitting: Neurology

## 2015-08-16 ENCOUNTER — Telehealth: Payer: Self-pay | Admitting: *Deleted

## 2015-08-16 NOTE — Telephone Encounter (Signed)
Pt records, faxed to [facility] on 08/16/15.

## 2015-08-16 NOTE — Telephone Encounter (Signed)
I called pt, pt need to sign a release form to release records to [facility].

## 2015-08-16 NOTE — Telephone Encounter (Signed)
Patient called requesting to speak to someone in Medical Records. Please call.

## 2015-09-15 ENCOUNTER — Encounter: Payer: Self-pay | Admitting: Neurology

## 2015-09-15 ENCOUNTER — Telehealth: Payer: Self-pay | Admitting: Neurology

## 2015-09-15 ENCOUNTER — Ambulatory Visit (INDEPENDENT_AMBULATORY_CARE_PROVIDER_SITE_OTHER): Payer: 59 | Admitting: Neurology

## 2015-09-15 VITALS — BP 112/70 | HR 66 | Resp 16 | Ht 60.0 in | Wt 174.0 lb

## 2015-09-15 DIAGNOSIS — M542 Cervicalgia: Secondary | ICD-10-CM | POA: Diagnosis not present

## 2015-09-15 DIAGNOSIS — G4700 Insomnia, unspecified: Secondary | ICD-10-CM | POA: Diagnosis not present

## 2015-09-15 DIAGNOSIS — F411 Generalized anxiety disorder: Secondary | ICD-10-CM | POA: Diagnosis not present

## 2015-09-15 DIAGNOSIS — G441 Vascular headache, not elsewhere classified: Secondary | ICD-10-CM | POA: Diagnosis not present

## 2015-09-15 DIAGNOSIS — R569 Unspecified convulsions: Secondary | ICD-10-CM | POA: Diagnosis not present

## 2015-09-15 MED ORDER — CLONAZEPAM 0.5 MG PO TABS: 0.5000 mg | ORAL_TABLET | Freq: Two times a day (BID) | ORAL | Status: DC | PRN

## 2015-09-15 MED ORDER — VERAPAMIL HCL ER 240 MG PO TBCR: 240.0000 mg | EXTENDED_RELEASE_TABLET | Freq: Every day | ORAL | Status: DC

## 2015-09-15 NOTE — Telephone Encounter (Signed)
Patient called 8:28:25am to advise, she had car trouble this morning, unable to get here 15 minutes early for 8:40am appointment this morning with Dr. Felecia Shelling, will be here by appointment time. Nurse Faith notified.

## 2015-09-15 NOTE — Progress Notes (Addendum)
[facility]  PATIENT: Angel Rodriguez DOB: Jul 26, 1975  REFERRING DOCTOR OR PCP:   SOURCE: patient, ED records, images on PACS, reports in EMR  _________________________________   HISTORICAL  CHIEF COMPLAINT:  Chief Complaint  Patient presents with  . Migraines    Sts. h/a's have been less frequent, less severe since last spenocath, tpi./fim    HISTORY OF PRESENT ILLNESS:  Angel Rodriguez is a 40 year old woman who was having daily headaches since January 2017.     She also reports stress/anxiety and seizures.  HA:   Since taking Verapamil and having occipital nerve block/splenius capitis muscle injection at the first visit and then [place] at the second, third and fourth visits, pain has done much better.   Pin is almost resolved.    Most of the time there is no pain but she has had some milder headaches.   After the second Sphenocath, the conjunctival redness got much better but returned.   It also improved after th third one.   Stress csan trigger the pounding headaches.    She no longer wakes up in the middle of hte night with headaches.    MRI of the brain was essentially normal with just a single small T2/FLAIR hyperintense focus in the left frontal subcortical white matter. The MR venogram was normal. ESR and ANA were normal.    Mood:  Clonazepam has helped anxiety much better than the escitalopram.    Seizure:   In March 2016, she had a seizure.   She had an aura of numbness in her right hand and then passed out.   She is not sure there was any generalized tonic-clonic activity. Her children did not tell her that she had any. However, she did have urinary incontinence. She does not remember much that day.    Since then, she has not had any more seizures.  She has not needed any AED as she had only the one seizure/spell.       Insomnia:   She is doing much better since starting clonazepam and having much less headache  REVIEW OF SYSTEMS: Constitutional: No  fevers, chills, sweats, or change in appetite.   Insomnia is better Eyes: as above Ear, nose and throat: No hearing loss, ear pain, nasal congestion, sore throat Cardiovascular: No chest pain, palpitations Respiratory: No shortness of breath at rest or with exertion.   No wheezes GastrointestinaI: No nausea, vomiting, diarrhea, abdominal pain, fecal incontinence Genitourinary: No dysuria, urinary retention or frequency.  No nocturia. Musculoskeletal: No neck pain, back pain Integumentary: No rash, pruritus, skin lesions Neurological: as above Psychiatric: No depression at this time.  Notes anxiety, better on clonazepam Endocrine: No palpitations, diaphoresis, change in appetite, change in weigh or increased thirst Hematologic/Lymphatic: No anemia, purpura, petechiae. Allergic/Immunologic: No itchy/runny eyes, nasal congestion, recent allergic reactions, rashes  ALLERGIES: Allergies  Allergen Reactions  . Ivp Dye [Iodinated Diagnostic Agents] Hives, Itching and Swelling  . Bee Venom Swelling    HOME MEDICATIONS:  Current outpatient prescriptions:  .  brimonidine (ALPHAGAN P) 0.1 % SOLN, , Disp: , Rfl:  .  Carboxymethylcellul-Glycerin (LUBRICATING EYE DROPS OP), Apply 1-2 drops to eye as needed (dry eyes)., Disp: , Rfl:  .  clonazePAM (KLONOPIN) 0.5 MG tablet, Take 1 tablet (0.5 mg total) by mouth 2 (two) times daily as needed for anxiety. Prescription needs to last 90 days, Disp: 180 tablet, Rfl: 1 .  Cyanocobalamin (VITAMIN B-12 PO), Take by mouth., Disp: , Rfl:  .  dorzolamide-timolol (COSOPT) 22.3-6.8 MG/ML ophthalmic solution, , Disp: , Rfl:  .  fluticasone (FLONASE) 50 MCG/ACT nasal spray, Place 1-2 sprays into both nostrils daily., Disp: , Rfl:  .  ibuprofen (ADVIL,MOTRIN) 200 MG tablet, Take 600-800 mg by mouth every 6 (six) hours as needed., Disp: , Rfl:  .  indomethacin (INDOCIN) 50 MG capsule, Take 1 capsule (50 mg total) by mouth 2 (two) times daily with a meal., Disp:  20 capsule, Rfl: 0 .  Multiple Vitamins-Minerals (MULTIVITAMIN WITH MINERALS) tablet, Take 1 tablet by mouth daily., Disp: , Rfl:  .  SUMAtriptan (IMITREX) 100 MG tablet, Take 1 tablet (100 mg total) by mouth once as needed for migraine. May repeat in 2 hours if headache persists or recurs., Disp: 10 tablet, Rfl: 2 .  verapamil (CALAN-SR) 240 MG CR tablet, Take 1 tablet (240 mg total) by mouth at bedtime., Disp: 90 tablet, Rfl: 2 No current facility-administered medications for this visit.  Facility-Administered Medications Ordered in Other Visits:  .  gadopentetate dimeglumine (MAGNEVIST) injection 16 mL, 16 mL, Intravenous, Once PRN, Asa Lente, MD  PAST MEDICAL HISTORY: Past Medical History  Diagnosis Date  . BV (bacterial vaginosis)   . Fibroids   . Trichomonas   . Menorrhagia   . Dysmenorrhea   . Yeast infection   . Pneumonia   . Seasonal allergies     PAST SURGICAL HISTORY: Past Surgical History  Procedure Laterality Date  . Hysteroscopy w/d&c    . Ablation    . Tubal ligation    . Wisdom tooth extraction    . Eye surgery      FAMILY HISTORY: Family History  Problem Relation Age of Onset  . Cancer Maternal Grandmother     unsure of type  . GER disease Mother   . Alcoholism Father   . Cirrhosis Father     SOCIAL HISTORY:  Social History   Social History  . Marital Status: Single    Spouse Name: N/A  . Number of Children: N/A  . Years of Education: N/A   Occupational History  . Not on file.   Social History Main Topics  . Smoking status: Current Some Day Smoker  . Smokeless tobacco: Never Used  . Alcohol Use: Yes     Comment: occasional  . Drug Use: No  . Sexual Activity: Not on file     Comment: btl   Other Topics Concern  . Not on file   Social History Narrative     PHYSICAL EXAM  Filed Vitals:   09/15/15 0849  BP: 112/70  Pulse: 66  Resp: 16  Height: 5' (1.524 m)  Weight: 174 lb (78.926 kg)    Body mass index is 33.98  kg/(m^2).   General: The patient is well-developed and well-nourished and in no acute distress  Eyes:    There is mild conjunctival redness on the right.  Neck: The neck is supple.  The neck is now nontender with good ROM  Musculoskeletal:  Back is nontender  Neurologic Exam  Mental status: The patient is alert and oriented x 3 at the time of the examination. The patient has apparent normal recent and remote memory, with an apparently normal attention span and concentration ability.   Speech is normal.  Cranial nerves: Extraocular movements are full. Left pupil reacts to light and accomodation.   Facial symmetry is present. There is normal facial sensation to soft touch.  Facial strength is normal.  Trapezius and sternocleidomastoid strength is normal.  No dysarthria is noted.  The tongue is midline, and the patient has symmetric elevation of the soft palate. No obvious hearing deficits are noted.  Motor:  Muscle bulk is normal.   Tone is normal. Strength is  5 / 5 in all 4 extremities.    Gait and station: Station is normal.   Gait is normal. Tandem gait is normal. Romberg is negative.       DIAGNOSTIC DATA (LABS, IMAGING, TESTING) - I reviewed patient records, labs, notes, testing and imaging myself where available.  Lab Results  Component Value Date   WBC 11.3* 05/30/2014   HGB 15.0 05/30/2014   HCT 44.2 05/30/2014   MCV 89.5 05/30/2014   PLT 99* 05/30/2014      Component Value Date/Time   NA 140 05/30/2014 1215   K 5.1 05/30/2014 1215   CL 111 05/30/2014 1215   CO2 22 05/30/2014 1215   GLUCOSE 113* 05/30/2014 1215   BUN 9 05/30/2014 1215   CREATININE 0.74 05/30/2014 1215   CALCIUM 9.6 05/30/2014 1215   PROT 7.3 05/30/2014 1215   ALBUMIN 4.0 05/30/2014 1215   AST 40* 05/30/2014 1215   ALT 31 05/30/2014 1215   ALKPHOS 57 05/30/2014 1215   BILITOT 0.6 05/30/2014 1215   GFRNONAA >90 05/30/2014 1215   GFRAA >90 05/30/2014 1215     _______________________________________ \    ASSESSMENT AND PLAN  Vascular headache  Anxiety state  Convulsions, unspecified convulsion type ([place])  Neck pain  Insomnia   1.   Continue verapamil for the vascular headaches.  2.   If severe headaches return we will do another sphenocath procedure.  3.    Continue clonazepam 0.5 mg twice a day for anxiety.   4.    As the MRI showed a white matter focus we will check another brain MRI by the end of the year to make sure that there has not been progression that could imply another disease process.   She will return in 4 months or sooner if there are new or worsening symptoms.  Richard A. Felecia Shelling, MD, PhD 06/15/8784, 7:67 AM Certified in Neurology, Clinical Neurophysiology, Sleep Medicine, Pain Medicine and Neuroimaging  [facility] [address], [place], [place] [address] [phone] [phone]

## 2015-10-13 ENCOUNTER — Telehealth: Payer: Self-pay | Admitting: Neurology

## 2015-10-13 MED ORDER — BUSPIRONE HCL 15 MG PO TABS: 15.0000 mg | ORAL_TABLET | Freq: Two times a day (BID) | ORAL | 5 refills | Status: DC

## 2015-10-13 NOTE — Addendum Note (Signed)
Addended by: France Ravens I on: 10/13/2015 05:52 PM   Modules accepted: Orders

## 2015-10-13 NOTE — Telephone Encounter (Signed)
I have spoken with Angel Rodriguez this afternoon.  She sts. Clonazepam 0.5mg  bid is no longer controlling anxiety.  Sts. Dr. Felecia Shelling "told me to let him know when the lower dose wasn't helping anymore, because he was starting me out on a really low dose."  She requests dose increase.  Per RAS, he would like her to try Buspar 15mg  po bid in addition to her current dose of  Clonazepam. Pt. verbalized understanding, expressed concern that she may experience negative side effects with Buspar, but is willing to try it.  Rx. escribed to [facility] per her request/fim

## 2015-10-13 NOTE — Telephone Encounter (Signed)
Patient is calling to discuss the dosage of medication clonazePAM (KLONOPIN) 0.5 MG tablet.

## 2015-12-14 ENCOUNTER — Telehealth: Payer: Self-pay | Admitting: Neurology

## 2015-12-14 NOTE — Telephone Encounter (Signed)
Patient requesting refill of clonazePAM (KLONOPIN) 0.5 MG tablet Pharmacy:

## 2015-12-14 NOTE — Telephone Encounter (Signed)
I spoke patient and advised her that she should have 1 more refill left. She will call back towards end of the month to refill Rx.

## 2015-12-14 NOTE — Telephone Encounter (Signed)
I spoke with patient and she states that her insurance will only let her get 30day supply of clonazepam at a time. I call [facility], the do not have prescription from July. Last Rx on file is from 07/08/15 and she has 1 refill left. I called her other pharmacy ([facility]) to make sure she did not have anything there.

## 2016-01-11 ENCOUNTER — Telehealth: Payer: Self-pay | Admitting: Neurology

## 2016-01-11 MED ORDER — CLONAZEPAM 0.5 MG PO TABS: 0.5000 mg | ORAL_TABLET | Freq: Two times a day (BID) | ORAL | 1 refills | Status: DC | PRN

## 2016-01-11 NOTE — Addendum Note (Signed)
Addended by: France Ravens I on: 01/11/2016 11:37 AM   Modules accepted: Orders

## 2016-01-11 NOTE — Telephone Encounter (Signed)
I have spoken with Angel Rodriguez today.  There is some confusion surrounding her Clonazepam rx's. RAS has checked the [place] registry to ensure rx's were filled appropriately.  Her ins. will now allow 90 day rx's, so she has only received 30 days rx's.  Rx. faxed to [facility] as requested/fim

## 2016-01-11 NOTE — Telephone Encounter (Signed)
Patient requesting refill of clonazePAM (KLONOPIN) 0.5 MG tablet called to [facility] on [address]. The patient has an appointment with Dr. Felecia Shelling on 01-17-16.

## 2016-01-12 DIAGNOSIS — D649 Anemia, unspecified: Secondary | ICD-10-CM | POA: Insufficient documentation

## 2016-01-12 DIAGNOSIS — O99345 Other mental disorders complicating the puerperium: Secondary | ICD-10-CM

## 2016-01-12 DIAGNOSIS — F53 Postpartum depression: Secondary | ICD-10-CM | POA: Insufficient documentation

## 2016-01-17 ENCOUNTER — Ambulatory Visit: Payer: 59 | Admitting: Neurology

## 2016-01-19 ENCOUNTER — Encounter: Payer: Self-pay | Admitting: Neurology

## 2016-01-21 ENCOUNTER — Ambulatory Visit (INDEPENDENT_AMBULATORY_CARE_PROVIDER_SITE_OTHER): Payer: 59 | Admitting: Neurology

## 2016-01-21 ENCOUNTER — Encounter: Payer: Self-pay | Admitting: Neurology

## 2016-01-21 ENCOUNTER — Telehealth: Payer: Self-pay | Admitting: Neurology

## 2016-01-21 VITALS — BP 98/62 | HR 60 | Ht 61.0 in | Wt 180.0 lb

## 2016-01-21 DIAGNOSIS — G43711 Chronic migraine without aura, intractable, with status migrainosus: Secondary | ICD-10-CM | POA: Diagnosis not present

## 2016-01-21 DIAGNOSIS — F411 Generalized anxiety disorder: Secondary | ICD-10-CM | POA: Diagnosis not present

## 2016-01-21 DIAGNOSIS — R930 Abnormal findings on diagnostic imaging of skull and head, not elsewhere classified: Secondary | ICD-10-CM

## 2016-01-21 DIAGNOSIS — G4700 Insomnia, unspecified: Secondary | ICD-10-CM

## 2016-01-21 DIAGNOSIS — R569 Unspecified convulsions: Secondary | ICD-10-CM | POA: Diagnosis not present

## 2016-01-21 MED ORDER — VERAPAMIL HCL ER 240 MG PO TBCR: 240.0000 mg | EXTENDED_RELEASE_TABLET | Freq: Every day | ORAL | 2 refills | Status: DC

## 2016-01-21 MED ORDER — CLONAZEPAM 0.5 MG PO TABS: 0.5000 mg | ORAL_TABLET | Freq: Two times a day (BID) | ORAL | 3 refills | Status: DC | PRN

## 2016-01-21 MED ORDER — BUSPIRONE HCL 15 MG PO TABS: 15.0000 mg | ORAL_TABLET | Freq: Two times a day (BID) | ORAL | 3 refills | Status: DC

## 2016-01-21 NOTE — Telephone Encounter (Signed)
I am sorry Dr. Brett Fairy, thank you for forwarding.

## 2016-01-21 NOTE — Telephone Encounter (Signed)
This patients MRI is requiring a P2P [phone] option 3 case number [id number]. I called and spoke with a nurse reviewer and she stated that it was not missing anything but this patients specific plan requires that they talk to the doctor. Please call within the next 3 business days.

## 2016-01-21 NOTE — Telephone Encounter (Signed)
I opened the chart and this patient as not mine. Forward to Dr Felecia Shelling. CD

## 2016-01-21 NOTE — Progress Notes (Signed)
[facility]  PATIENT: Angel Rodriguez DOB: 09/09/1975  REFERRING DOCTOR OR PCP:   SOURCE: patient, ED records, images on PACS, reports in EMR  _________________________________   HISTORICAL  CHIEF COMPLAINT:  Chief Complaint  Patient presents with  . Rm 13  . Follow-up  . Headache    Pt continues Verapamil as well as Imitrex prn. Reports a HA about once every 3-4 weeks.  . Seizures    Denies s/s of seizure activity.  . Insomnia    Still takes clonazepam for anxiety. Says that she has been sleeping well, occasionally takes melatonin.  . Eye Problem    C/o ongoing redness to R eye.    HISTORY OF PRESENT ILLNESS:  Angel Rodriguez is a 40 year old woman who had daily headaches.     She also has issues with stress/anxiety and seizures.  HA:   Currently, she is only having a headache every few weeks.  They greatly improved after starting Verapamil and having occipital nerve block/splenius capitis muscle injection at the first visit and then Zuehl at the second, third and fourth visits.   After the second Sphenocath, the conjunctival redness got much better but returned.   It also improved after the third one and did not come back.      Most of the time there is no pain but she has had some milder headaches.   They resolves with Imitrex and rest,.  Ibuprofen sometimes helps.    She tolerates the verapamil well but has occasional constipation .    Studies:   MRI of the brain was essentially normal with just a single small T2/FLAIR hyperintense focus in the left frontal subcortical white matter. The MR venogram was normal. ESR and ANA were normal.    Mood:  Anxiety is much better.   Clonazepam with Buspar has helped better than the escitalopram.    Seizure:   In March 2016, she had a seizure.   She had an aura of numbness in her right hand and then passed out.   She is not sure there was any generalized tonic-clonic activity. Her children did not tell her that she had  any. However, she did have urinary incontinence. She does not remember much that day.    Since then, she has not had any more seizures.  She has not needed any AED as she had only the one seizure/spell.       Insomnia:   She is sleeping better since starting clonazepam and having much less headache  REVIEW OF SYSTEMS: Constitutional: No fevers, chills, sweats, or change in appetite.  No longer has insomnia Eyes: as above Ear, nose and throat: No hearing loss, ear pain, nasal congestion, sore throat Cardiovascular: No chest pain, palpitations Respiratory: No shortness of breath at rest or with exertion.   No wheezes GastrointestinaI: No nausea, vomiting, diarrhea, abdominal pain, fecal incontinence Genitourinary: No dysuria, urinary retention or frequency.  No nocturia. Musculoskeletal: No neck pain, back pain Integumentary: No rash, pruritus, skin lesions Neurological: as above Psychiatric: No depression at this time.  Notes anxiety, better on clonazepam and Buspar Endocrine: No palpitations, diaphoresis, change in appetite, change in weigh or increased thirst Hematologic/Lymphatic: No anemia, purpura, petechiae. Allergic/Immunologic: No itchy/runny eyes, nasal congestion, recent allergic reactions, rashes  ALLERGIES: Allergies  Allergen Reactions  . Ivp Dye [Iodinated Diagnostic Agents] Hives, Itching and Swelling  . Bee Venom Swelling    HOME MEDICATIONS:  Current Outpatient Prescriptions:  .  atropine 1 % ophthalmic  solution, , Disp: , Rfl:  .  brimonidine (ALPHAGAN P) 0.1 % SOLN, , Disp: , Rfl:  .  busPIRone (BUSPAR) 15 MG tablet, Take 1 tablet (15 mg total) by mouth 2 (two) times daily., Disp: 180 tablet, Rfl: 3 .  Carboxymethylcellul-Glycerin (LUBRICATING EYE DROPS OP), Apply 1-2 drops to eye as needed (dry eyes)., Disp: , Rfl:  .  clonazePAM (KLONOPIN) 0.5 MG tablet, Take 1 tablet (0.5 mg total) by mouth 2 (two) times daily as needed for anxiety., Disp: 60 tablet, Rfl:  3 .  Cyanocobalamin (VITAMIN B-12 PO), Take by mouth., Disp: , Rfl:  .  dorzolamide-timolol (COSOPT) 22.3-6.8 MG/ML ophthalmic solution, , Disp: , Rfl:  .  fluticasone (FLONASE) 50 MCG/ACT nasal spray, Place 1-2 sprays into both nostrils daily., Disp: , Rfl:  .  ibuprofen (ADVIL,MOTRIN) 200 MG tablet, Take 600-800 mg by mouth every 6 (six) hours as needed., Disp: , Rfl:  .  latanoprost (XALATAN) 0.005 % ophthalmic solution, , Disp: , Rfl:  .  Multiple Vitamins-Minerals (MULTIVITAMIN WITH MINERALS) tablet, Take 1 tablet by mouth daily., Disp: , Rfl:  .  SUMAtriptan (IMITREX) 100 MG tablet, Take 1 tablet (100 mg total) by mouth once as needed for migraine. May repeat in 2 hours if headache persists or recurs., Disp: 10 tablet, Rfl: 2 .  verapamil (CALAN-SR) 240 MG CR tablet, Take 1 tablet (240 mg total) by mouth at bedtime., Disp: 90 tablet, Rfl: 2 No current facility-administered medications for this visit.   Facility-Administered Medications Ordered in Other Visits:  .  gadopentetate dimeglumine (MAGNEVIST) injection 16 mL, 16 mL, Intravenous, Once PRN, Britt Bottom, MD  PAST MEDICAL HISTORY: Past Medical History:  Diagnosis Date  . BV (bacterial vaginosis)   . Dysmenorrhea   . Fibroids   . Menorrhagia   . Pneumonia   . Seasonal allergies   . Trichomonas   . Yeast infection     PAST SURGICAL HISTORY: Past Surgical History:  Procedure Laterality Date  . ABLATION    . EYE SURGERY    . HYSTEROSCOPY W/D&C    . TUBAL LIGATION    . WISDOM TOOTH EXTRACTION      FAMILY HISTORY: Family History  Problem Relation Age of Onset  . Cancer Maternal Grandmother     unsure of type  . GER disease Mother   . Alcoholism Father   . Cirrhosis Father     SOCIAL HISTORY:  Social History   Social History  . Marital status: Single    Spouse name: N/A  . Number of children: N/A  . Years of education: N/A   Occupational History  . Not on file.   Social History Main Topics  .  Smoking status: Current Some Day Smoker  . Smokeless tobacco: Never Used  . Alcohol use Yes     Comment: occasional  . Drug use: No  . Sexual activity: Not on file     Comment: btl   Other Topics Concern  . Not on file   Social History Narrative  . No narrative on file     PHYSICAL EXAM  Vitals:   01/21/16 0828  BP: 98/62  Pulse: 60  Weight: 180 lb (81.6 kg)  Height: '5\' 1"'$  (1.549 m)    Body mass index is 34.01 kg/m.   General: The patient is well-developed and well-nourished and in no acute distress  Eyes:    There is very mild conjunctival redness on the right (much better than initial visits)  Neck:  The neck is supple.  The neck is now nontender with good ROM  Musculoskeletal:  Back is nontender  Neurologic Exam  Mental status: The patient is alert and oriented x 3 at the time of the examination. The patient has apparent normal recent and remote memory, with an apparently normal attention span and concentration ability.   Speech is normal.  Cranial nerves: Extraocular movements are full.   Facial symmetry is present. There is normal facial sensation to soft touch.  Facial strength is normal.  Trapezius and sternocleidomastoid strength is normal. No dysarthria is noted.  The tongue is midline, and the patient has symmetric elevation of the soft palate. No obvious hearing deficits are noted.  Motor:  Muscle bulk is normal.   Tone is normal. Strength is  5 / 5 in all 4 extremities.    Gait and station: Station is normal.   Gait is normal. Tandem gait is normal. Romberg is negative.       DIAGNOSTIC DATA (LABS, IMAGING, TESTING) - I reviewed patient records, labs, notes, testing and imaging myself where available.  Lab Results  Component Value Date   WBC 11.3 (H) 05/30/2014   HGB 15.0 05/30/2014   HCT 44.2 05/30/2014   MCV 89.5 05/30/2014   PLT 99 (L) 05/30/2014      Component Value Date/Time   NA 140 05/30/2014 1215   K 5.1 05/30/2014 1215   CL 111  05/30/2014 1215   CO2 22 05/30/2014 1215   GLUCOSE 113 (H) 05/30/2014 1215   BUN 9 05/30/2014 1215   CREATININE 0.74 05/30/2014 1215   CALCIUM 9.6 05/30/2014 1215   PROT 7.3 05/30/2014 1215   ALBUMIN 4.0 05/30/2014 1215   AST 40 (H) 05/30/2014 1215   ALT 31 05/30/2014 1215   ALKPHOS 57 05/30/2014 1215   BILITOT 0.6 05/30/2014 1215   GFRNONAA >90 05/30/2014 1215   GFRAA >90 05/30/2014 1215    _______________________________________ \    ASSESSMENT AND PLAN  Intractable chronic migraine without aura and with status migrainosus - Plan: MR BRAIN W WO CONTRAST  Convulsions, unspecified convulsion type ([place]) - Plan: MR BRAIN W WO CONTRAST  Insomnia, unspecified type  Anxiety state  Abnormal MRI of head - Plan: MR BRAIN W WO CONTRAST   1.   Continue verapamil for the headaches with Imitrex for breakthrough 2.   If severe headaches return we will do another sphenocath procedure.  3.    Continue clonazepam 0.5 mg twice a day for anxiety and insomnia and BuSpar for anxiety.    4.    As the MRI showed a white matter focus we will check another brain MRI to make sure that there has not been progression that could imply another disease process such as MS or vasculitis   She will return in 4 months or sooner if there are new or worsening symptoms.  Olyver Hawes A. Felecia Shelling, MD, PhD 36/[id number]/9476, 5:46 AM Certified in Neurology, Clinical Neurophysiology, Sleep Medicine, Pain Medicine and Neuroimaging  [facility] [address], [place], [place] [address] [phone]

## 2016-01-26 NOTE — Telephone Encounter (Signed)
I did P2P  Auth #  [id number]  Thank you

## 2016-02-14 ENCOUNTER — Ambulatory Visit
Admission: RE | Admit: 2016-02-14 | Discharge: 2016-02-14 | Disposition: A | Discharge status: Home / Self Care | Payer: 59 | Source: Ambulatory Visit | Attending: Neurology | Admitting: Neurology

## 2016-02-14 DIAGNOSIS — G43711 Chronic migraine without aura, intractable, with status migrainosus: Secondary | ICD-10-CM | POA: Diagnosis not present

## 2016-02-14 DIAGNOSIS — R930 Abnormal findings on diagnostic imaging of skull and head, not elsewhere classified: Secondary | ICD-10-CM | POA: Diagnosis not present

## 2016-02-14 DIAGNOSIS — R569 Unspecified convulsions: Secondary | ICD-10-CM | POA: Diagnosis not present

## 2016-02-14 MED ORDER — GADOBENATE DIMEGLUMINE 529 MG/ML IV SOLN
15.0000 mL | Freq: Once | INTRAVENOUS | Status: AC | PRN
  Administered 2016-02-14: 15 mL via INTRAVENOUS

## 2016-02-14 NOTE — Telephone Encounter (Signed)
Noted, thank you

## 2016-02-15 ENCOUNTER — Telehealth: Payer: Self-pay | Admitting: Neurology

## 2016-02-15 DIAGNOSIS — G441 Vascular headache, not elsewhere classified: Secondary | ICD-10-CM

## 2016-02-15 DIAGNOSIS — H348192 Central retinal vein occlusion, unspecified eye, stable: Secondary | ICD-10-CM

## 2016-02-15 NOTE — Telephone Encounter (Signed)
I went over the results of the MRI. There are 2 juxtacortical foci in the left, also noted on her last MRI. There is a punctate focus of enhancement associated with the one lesion.   We will check a pan-Anca.   She has a follow-up visit.

## 2016-02-16 ENCOUNTER — Other Ambulatory Visit (INDEPENDENT_AMBULATORY_CARE_PROVIDER_SITE_OTHER): Payer: Self-pay

## 2016-02-16 DIAGNOSIS — Z0289 Encounter for other administrative examinations: Secondary | ICD-10-CM

## 2016-02-16 DIAGNOSIS — H348192 Central retinal vein occlusion, unspecified eye, stable: Secondary | ICD-10-CM

## 2016-02-16 DIAGNOSIS — G441 Vascular headache, not elsewhere classified: Secondary | ICD-10-CM

## 2016-02-18 ENCOUNTER — Telehealth: Payer: Self-pay | Admitting: *Deleted

## 2016-02-18 LAB — PAN-ANCA
ANCA Proteinase 3: <3.5 U/mL (ref 0.0–3.5)
C-ANCA: <1:20 {titer}
Myeloperoxidase Ab: <9 U/mL (ref 0.0–9.0)

## 2016-02-18 NOTE — Telephone Encounter (Signed)
I have spoken with Angel Rodriguez this morning and per RAS, advised that additional lab test is also negative/fim

## 2016-02-18 NOTE — Telephone Encounter (Signed)
-----   Message from Britt Bottom, MD sent at 02/18/2016 10:34 AM EST ----- Please let her know the additional blood test was also negative.

## 2016-02-29 ENCOUNTER — Other Ambulatory Visit: Payer: Self-pay | Admitting: Neurology

## 2016-03-01 ENCOUNTER — Other Ambulatory Visit: Payer: Self-pay | Admitting: *Deleted

## 2016-03-01 MED ORDER — CLONAZEPAM 0.5 MG PO TABS: 0.5000 mg | ORAL_TABLET | Freq: Two times a day (BID) | ORAL | 3 refills | Status: DC | PRN

## 2016-03-04 IMAGING — US US TRANSVAGINAL NON-OB
1 series · 13 of 25 positions shown · non-contrast
Comparison: CT abdomen pelvis [DATE]

CLINICAL DATA: Right lower quadrant pain.  History of hysterectomy.

EXAM:
TRANSABDOMINAL AND TRANSVAGINAL ULTRASOUND OF PELVIS
DOPPLER ULTRASOUND OF OVARIES
TECHNIQUE: Both transabdominal and transvaginal ultrasound examinations of the
pelvis were performed. Transabdominal technique was performed for
global imaging of the pelvis including uterus, ovaries, adnexal
regions, and pelvic cul-de-sac.
It was necessary to proceed with endovaginal exam following the
transabdominal exam to visualize the ovaries. Color and duplex
Doppler ultrasound was utilized to evaluate blood flow to the
ovaries.

[Series 1: us transvaginal non-ob · 0.27mm/px · 13 of 112 slices shown]
[im 1/112]
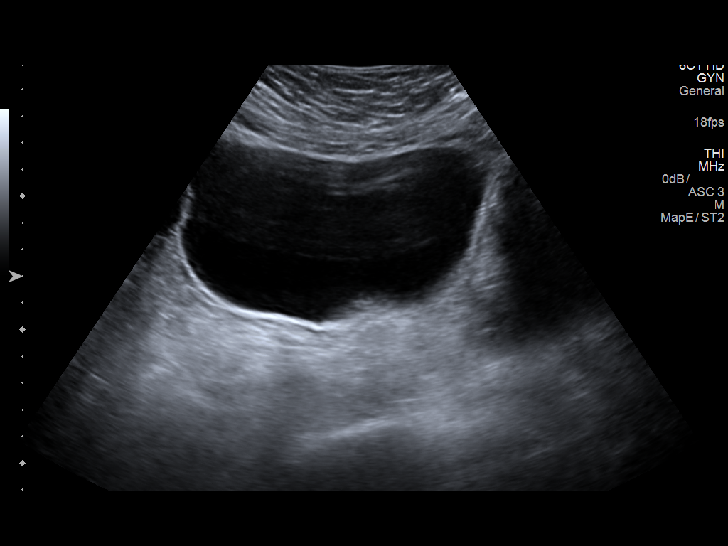
[im 10/112]
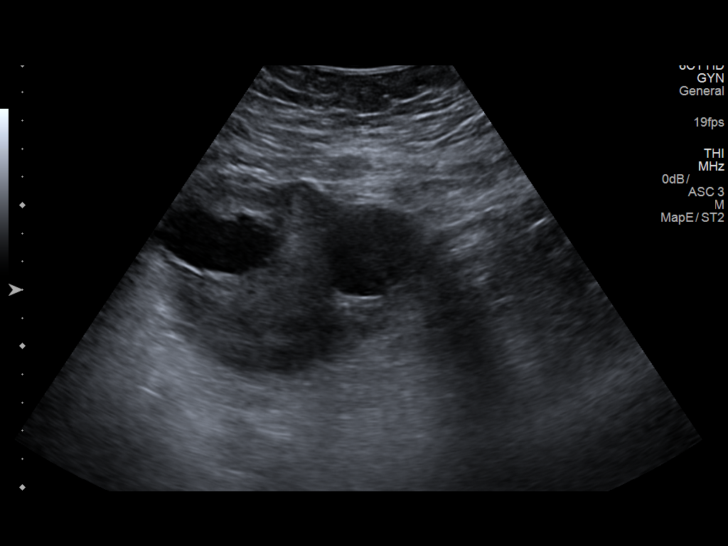
[im 19/112]
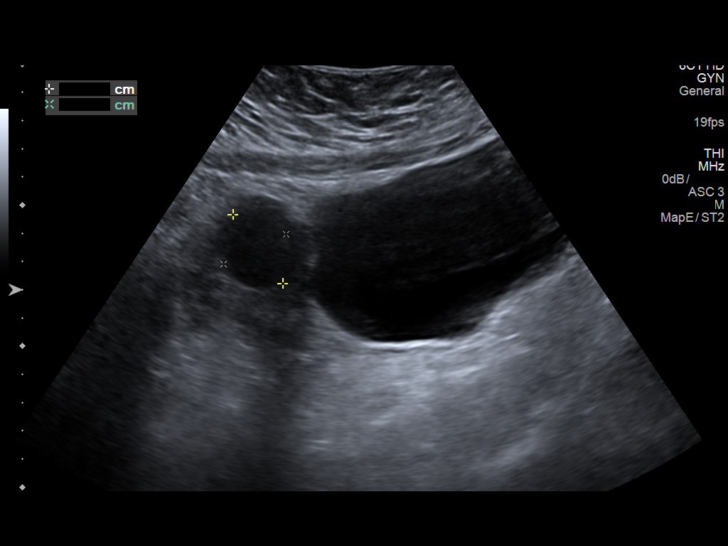
[im 28/112]
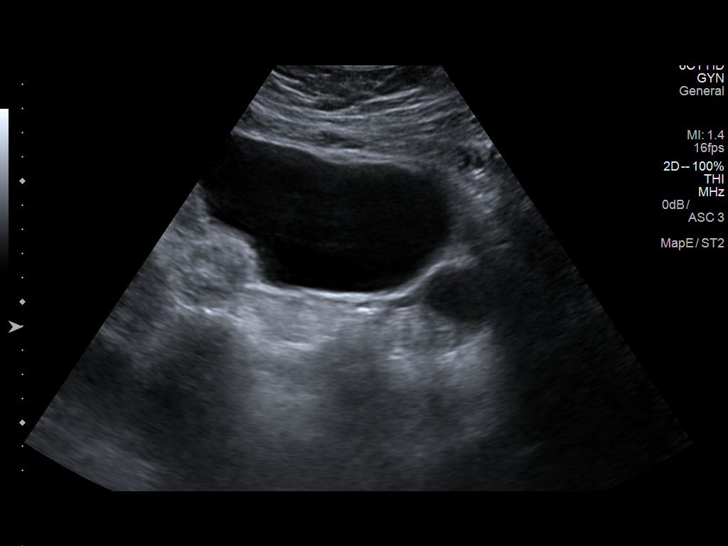
[im 38/112]
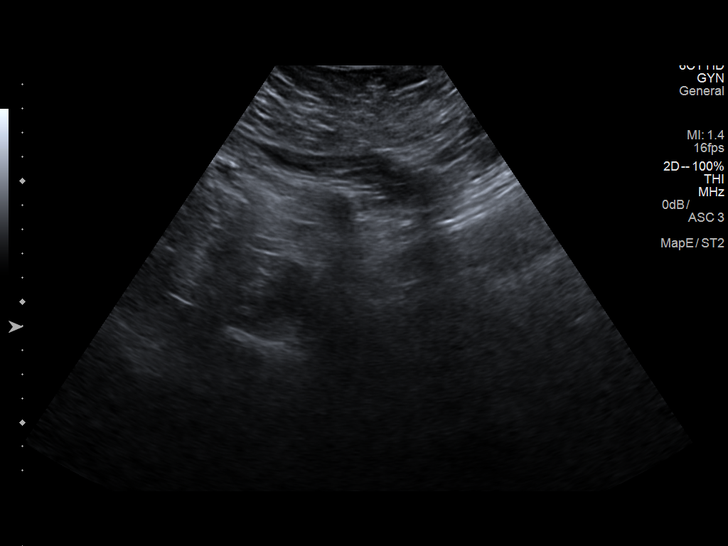
[im 47/112]
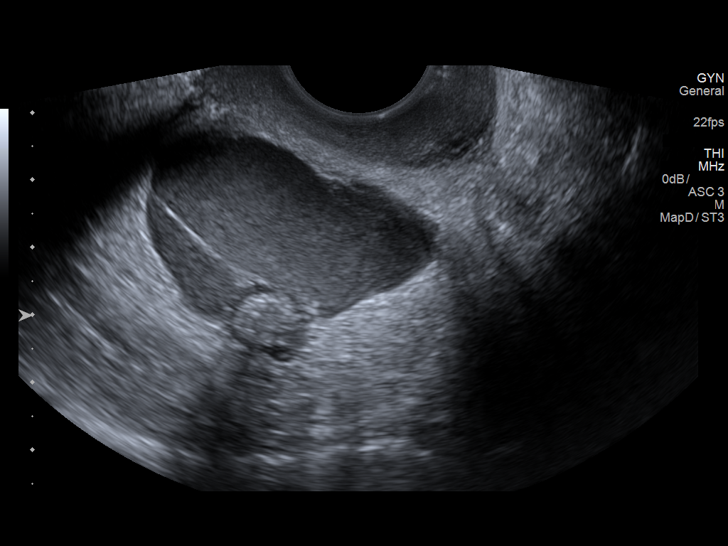
[im 56/112]
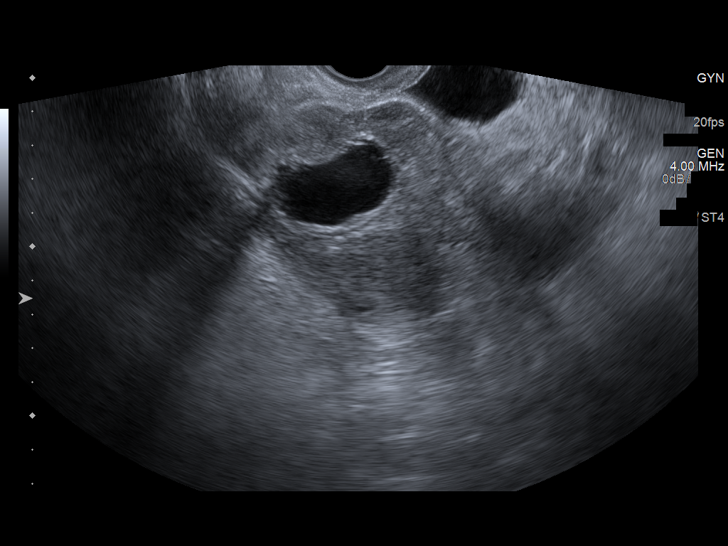
[im 65/112]
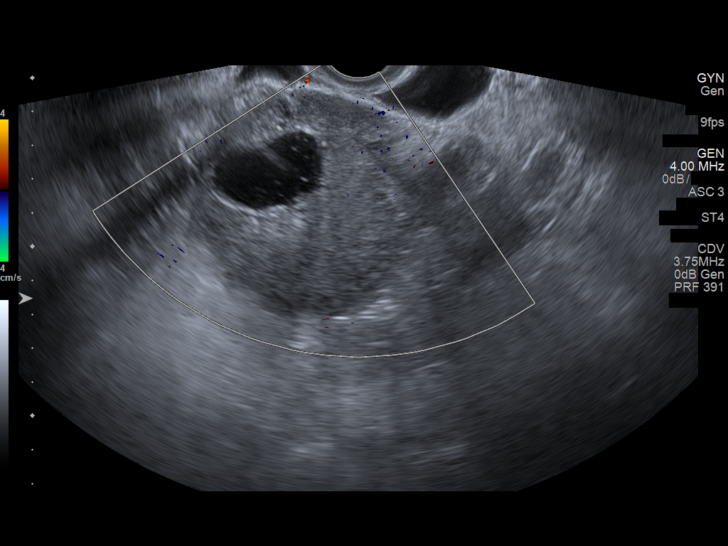
[im 75/112]
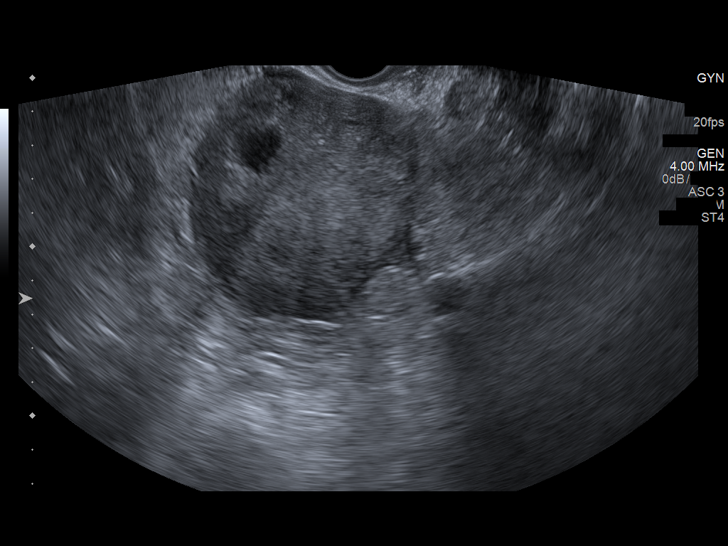
[im 84/112]
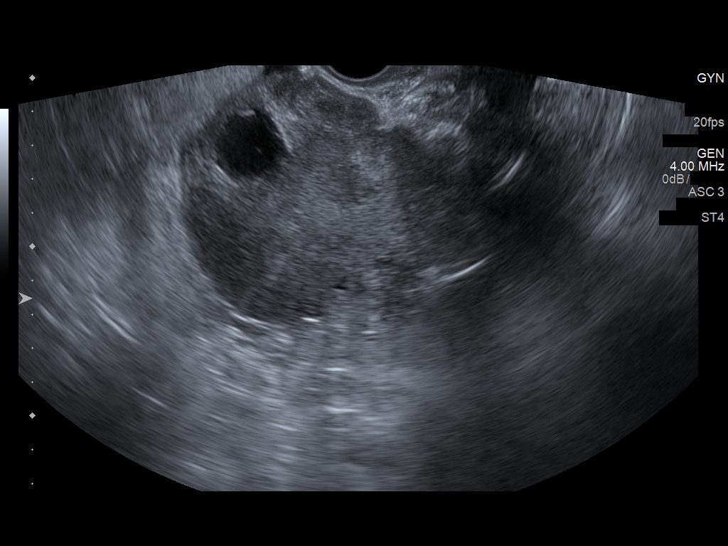
[im 93/112]
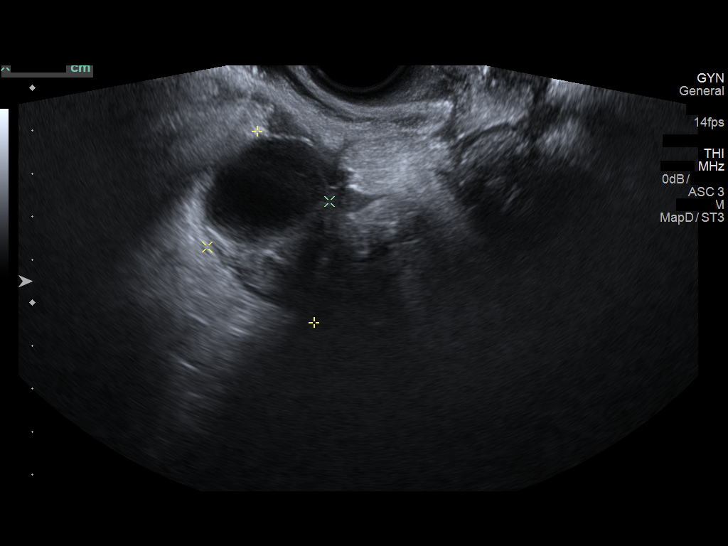
[im 102/112]
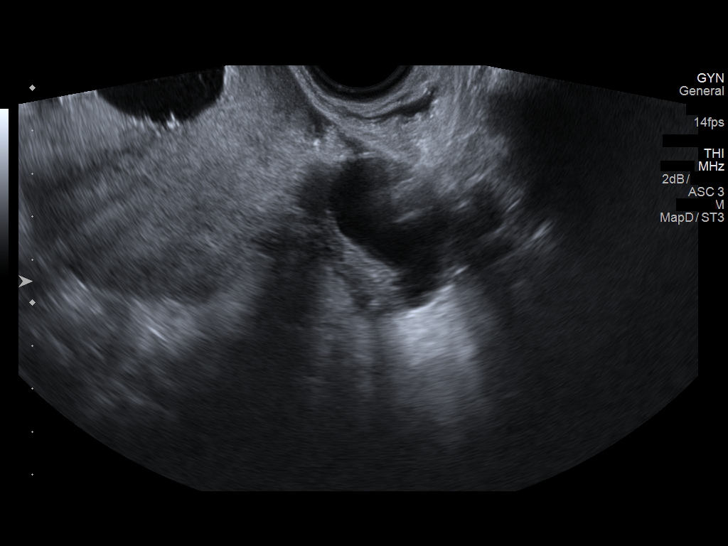
[im 112/112]
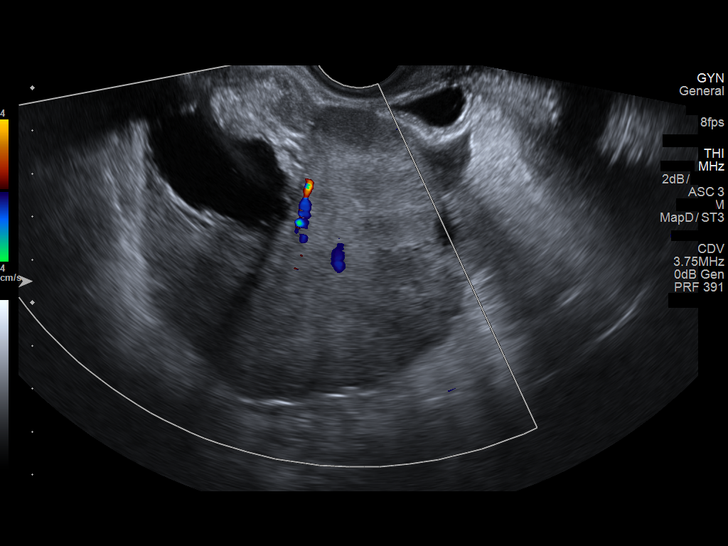

[13 of 25 positions shown; findings below may reference images not displayed]

FINDINGS: Uterus

Status post hysterectomy.

Endometrium

Status post hysterectomy.

Right ovary

Measurements: 8.8 x 7.5 x 7.4 cm. There are multiple cysts measuring
up to 3.8 x 3.5 x 2.5 cm. The ovary is markedly enlarged with
increased echogenicity consistent with edema.

Left ovary

Measurements: 4.6 x 3.0 x 3.2 cm. Normal appearance/no adnexal mass.

Pulsed Doppler evaluation of both ovaries demonstrates normal
low-resistance arterial and venous waveforms.

Other findings

Small amount of free fluid in the posterior cul-de-sac.
IMPRESSION: Enlarged, edematous right ovary position in the abdominal midline is
suggestive of ovarian torsion, despite maintained arterial and
venous waveforms. Intermittent torsion/detorsion is possible.

These results were discussed by telephone at the time of
interpretation on [DATE] at [DATE] with Dr. JOE, who
verbally acknowledged these results.

## 2016-03-31 ENCOUNTER — Encounter: Payer: Self-pay | Admitting: Hematology

## 2016-03-31 ENCOUNTER — Telehealth: Payer: Self-pay | Admitting: Hematology

## 2016-03-31 NOTE — Telephone Encounter (Signed)
Appt has been scheduled for the pt to see Dr. Irene Limbo on 2/14 at 11am. Demographics verified. Letter mailed to the pt.

## 2016-04-19 ENCOUNTER — Ambulatory Visit (HOSPITAL_BASED_OUTPATIENT_CLINIC_OR_DEPARTMENT_OTHER): Payer: 59

## 2016-04-19 ENCOUNTER — Encounter: Payer: Self-pay | Admitting: Hematology

## 2016-04-19 ENCOUNTER — Telehealth: Payer: Self-pay | Admitting: Hematology

## 2016-04-19 ENCOUNTER — Ambulatory Visit (HOSPITAL_BASED_OUTPATIENT_CLINIC_OR_DEPARTMENT_OTHER): Payer: 59 | Admitting: Hematology

## 2016-04-19 VITALS — BP 108/72 | HR 60 | Temp 98.6°F | Resp 18 | Ht 61.0 in | Wt 184.5 lb

## 2016-04-19 DIAGNOSIS — D696 Thrombocytopenia, unspecified: Secondary | ICD-10-CM

## 2016-04-19 LAB — CBC & DIFF AND RETIC
BASO%: 0.1 % (ref 0.0–2.0)
BASOS ABS: 0 10*3/uL (ref 0.0–0.1)
EOS%: 1.7 % (ref 0.0–7.0)
Eosinophils Absolute: 0.2 10*3/uL (ref 0.0–0.5)
HEMATOCRIT: 39 % (ref 34.8–46.6)
HGB: 13.5 g/dL (ref 11.6–15.9)
IMMATURE RETIC FRACT: 6 % (ref 1.60–10.00)
LYMPH%: 25.8 % (ref 14.0–49.7)
MCH: 30.4 pg (ref 25.1–34.0)
MCHC: 34.6 g/dL (ref 31.5–36.0)
MCV: 87.8 fL (ref 79.5–101.0)
MONO#: 0.6 10*3/uL (ref 0.1–0.9)
MONO%: 5.2 % (ref 0.0–14.0)
NEUT#: 7.1 10*3/uL — ABNORMAL HIGH (ref 1.5–6.5)
NEUT%: 67.2 % (ref 38.4–76.8)
Platelets: 83 10*3/uL — ABNORMAL LOW (ref 145–400)
RBC: 4.44 10*6/uL (ref 3.70–5.45)
RDW: 12.9 % (ref 11.2–14.5)
RETIC CT ABS: 92.8 10*3/uL — AB (ref 33.70–90.70)
Retic %: 2.09 % (ref 0.70–2.10)
WBC: 10.6 10*3/uL — ABNORMAL HIGH (ref 3.9–10.3)
lymph#: 2.7 10*3/uL (ref 0.9–3.3)

## 2016-04-19 LAB — COMPREHENSIVE METABOLIC PANEL
ALT: 19 U/L (ref 0–55)
AST: 15 U/L (ref 5–34)
Albumin: 4.3 g/dL (ref 3.5–5.0)
Alkaline Phosphatase: 61 U/L (ref 40–150)
Anion Gap: 10 mEq/L (ref 3–11)
BUN: 7 mg/dL (ref 7.0–26.0)
CALCIUM: 9.9 mg/dL (ref 8.4–10.4)
CO2: 21 meq/L — AB (ref 22–29)
CREATININE: 0.7 mg/dL (ref 0.6–1.1)
Chloride: 107 mEq/L (ref 98–109)
Glucose: 111 mg/dl (ref 70–140)
Potassium: 3.9 mEq/L (ref 3.5–5.1)
Sodium: 138 mEq/L (ref 136–145)
TOTAL PROTEIN: 7.8 g/dL (ref 6.4–8.3)
Total Bilirubin: 0.46 mg/dL (ref 0.20–1.20)

## 2016-04-19 LAB — CHCC SMEAR

## 2016-04-19 LAB — LACTATE DEHYDROGENASE: LDH: 171 U/L (ref 125–245)

## 2016-04-19 LAB — TECHNOLOGIST REVIEW

## 2016-04-19 NOTE — Telephone Encounter (Signed)
Lab added for today, per 04/19/16 los. Patient was given a copy of the AVS report per 04/19/16.

## 2016-04-19 NOTE — Progress Notes (Signed)
[name]    HEMATOLOGY/ONCOLOGY CONSULTATION NOTE  Date of Service: 04/19/2016  Patient Care Team: Damaris Hippo, MD as PCP - General (Family Medicine)  CHIEF COMPLAINTS/PURPOSE OF CONSULTATION:  Thrombocytopenia.  HISTORY OF PRESENTING ILLNESS:   Angel Rodriguez is a wonderful 41 y.o. female who has been referred to [place] by Dr .Damaris Hippo, MD  for evaluation and management of thrombocytopenia.  Patient has a history of obesity, anxiety, menorrhagia related to urinary tract infection as well as had a history of mild thrombocytopenia for at least 1-2 years. She was noted to have a platelet count of 99k in March 2016 with a normal hemoglobin and WBC count. She recently had routine labs with her primary care physician on 02/16/2016 and was noted to have a platelet count of 78k with a hemoglobin of 13.1 and a WBC count of 7.5k.  Patient notes some easy bruisability but no overt bleeding. No nosebleeds no, bleeds no GI bleeding or hematuria. Does tend to have menorrhagia related to uterine fibroids but notes that this is much improved since her endometrial ablation.  No fever no chills no night sweats. Has been gaining some weight but is trying to work on this. Has had issues with intermittent cluster headaches for which she has been on a few prednisone bursts.  She does use NSAIDs on an off for dysmenorrhea . Denies any other significant new medications. Has previously had some issues with excessive alcohol use but notes that she has been drinking only a couple of drinks a week recently. Smoking about 5 cigarettes a week. Denies any other drug abuse.   MEDICAL HISTORY:  Past Medical History:  Diagnosis Date  . BV (bacterial vaginosis)   . Dysmenorrhea   . Fibroids   . Menorrhagia   . Pneumonia   . Seasonal allergies   . Trichomonas   . 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 Unclear etiology. Patient notes she had  extensive workup which was unrevealing. Patient notes she has minimal vision in that eye. Obesity Dyslipidemia Anxiety Cluster headaches - follows with [facility].   SURGICAL HISTORY: Past Surgical History:  Procedure Laterality Date  . ABLATION    . EYE SURGERY    . HYSTEROSCOPY W/D&C    . TUBAL LIGATION    . WISDOM TOOTH EXTRACTION      SOCIAL HISTORY: Social History   Social History  . Marital status: Single    Spouse name: N/A  . Number of children: N/A  . Years of education: N/A   Occupational History  . Not on file.   Social History Main Topics  . Smoking status: Current Some Day Smoker  . Smokeless tobacco: Never Used  . Alcohol use Yes     Comment: occasional  . Drug use: No  . Sexual activity: Not on file     Comment: btl   Other Topics Concern  . Not on file   Social History Narrative  . No narrative on file  Drinks about 2 drinks a week.  FAMILY HISTORY: Family History  Problem Relation Age of Onset  . GER disease Mother   . Alcoholism Father   . Cirrhosis Father   . Cancer Maternal Grandmother     unsure of type    ALLERGIES:  is allergic to ivp dye [iodinated diagnostic agents] and bee venom.  MEDICATIONS:  Current Outpatient Prescriptions  Medication Sig Dispense Refill  . atorvastatin (  LIPITOR) 40 MG tablet     . atropine 1 % ophthalmic solution     . brimonidine (ALPHAGAN P) 0.1 % SOLN     . busPIRone (BUSPAR) 15 MG tablet Take 1 tablet (15 mg total) by mouth 2 (two) times daily. 180 tablet 3  . Carboxymethylcellul-Glycerin (LUBRICATING EYE DROPS OP) Apply 1-2 drops to eye as needed (dry eyes).    . clonazePAM (KLONOPIN) 0.5 MG tablet Take 1 tablet (0.5 mg total) by mouth 2 (two) times daily as needed for anxiety. 60 tablet 3  . Cyanocobalamin (VITAMIN B-12 PO) Take by mouth.    . dorzolamide-timolol (COSOPT) 22.3-6.8 MG/ML ophthalmic solution     . fluticasone (FLONASE) 50 MCG/ACT nasal spray Place 1-2  sprays into both nostrils daily.    [name] ibuprofen (ADVIL,MOTRIN) 200 MG tablet Take 600-800 mg by mouth every 6 (six) hours as needed.    . latanoprost (XALATAN) 0.005 % ophthalmic solution     . Multiple Vitamins-Minerals (MULTIVITAMIN WITH MINERALS) tablet Take 1 tablet by mouth daily.    . SUMAtriptan (IMITREX) 100 MG tablet Take 1 tablet (100 mg total) by mouth once as needed for migraine. May repeat in 2 hours if headache persists or recurs. 10 tablet 2  . verapamil (CALAN-SR) 240 MG CR tablet Take 1 tablet (240 mg total) by mouth at bedtime. 90 tablet 2   No current facility-administered medications for this visit.    Facility-Administered Medications Ordered in Other Visits  Medication Dose Route Frequency Provider Last Rate Last Dose  . gadopentetate dimeglumine (MAGNEVIST) injection 16 mL  16 mL Intravenous Once PRN Britt Bottom, MD        REVIEW OF SYSTEMS:    10 Point review of Systems was done is negative except as noted above.  PHYSICAL EXAMINATION: ECOG PERFORMANCE STATUS: 0 - Asymptomatic  . Vitals:   04/19/16 1120  BP: 108/72  Pulse: 60  Resp: 18  Temp: 98.6 F (37 C)   Filed Weights   04/19/16 1120  Weight: 184 lb 8 oz (83.7 kg)   .Body mass index is 34.86 kg/m.  GENERAL:alert, in no acute distress and comfortable SKIN: skin color, texture, turgor are normal, no rashes or significant lesions EYES: normal, conjunctiva are pink and non-injected, sclera clear OROPHARYNX:no exudate, no erythema and lips, buccal mucosa, and tongue normal  NECK: supple, no JVD, thyroid normal size, non-tender, without nodularity LYMPH:  no palpable lymphadenopathy in the cervical, axillary or inguinal LUNGS: clear to auscultation with normal respiratory effort HEART: regular rate & rhythm,  no murmurs and no lower extremity edema ABDOMEN: abdomen soft, non-tender, normoactive bowel sounds  Musculoskeletal: no cyanosis of digits and no clubbing  PSYCH: alert & oriented x 3  with fluent speech NEURO: no focal motor/sensory deficits  LABORATORY DATA:  I have reviewed the data as listed  . CBC Latest Ref Rng & Units 04/19/2016 05/30/2014 07/03/2008  WBC 3.9 - 10.3 10e3/uL 10.6(H) 11.3(H) 6.2  Hemoglobin 11.6 - 15.9 g/dL 13.5 15.0 13.8  Hematocrit 34.8 - 46.6 % 39.0 44.2 39.8  Platelets 145 - 400 10e3/uL 83(L) 99(L) 180    . CMP Latest Ref Rng & Units 04/19/2016 05/30/2014  Glucose 70 - 140 mg/dl 111 113(H)  BUN 7.0 - 26.0 mg/dL 7.0 9  Creatinine 0.6 - 1.1 mg/dL 0.7 0.74  Sodium 136 - 145 mEq/L 138 140  Potassium 3.5 - 5.1 mEq/L 3.9 5.1  Chloride 96 - 112 mmol/L - 111  CO2 22 - 29  mEq/L 21(L) 22  Calcium 8.4 - 10.4 mg/dL 9.9 9.6  Total Protein 6.4 - 8.3 g/dL 7.8 7.3  Total Bilirubin 0.20 - 1.20 mg/dL 0.46 0.6  Alkaline Phos 40 - 150 U/L 61 57  AST 5 - 34 U/L 15 40(H)  ALT 0 - 55 U/L 19 31     RADIOGRAPHIC STUDIES: I have personally reviewed the radiological images as listed and agreed with the findings in the report. No results found.  ASSESSMENT & PLAN:   41 year old female with  #1 Mild-Moderate thrombocytopenia.  No associated anemia. Borderline elevation of WBC count which could be due to steroid use. Her platelets were about 99k in March 2016. 78k in December 2017 and are currently 83k. No issues with overt bleeding. Could be lowered due to passing viral infection vs ITP vs medications (uses ibuprofen on and off) LDH within normal limits. No symptoms suggestive of lymphoproliferative process. Peripheral blood smear showed no platelet clumping few large platelets. Some decrease in platelets could be due to increased consumption from her heavy periods. Patients age and Absence of associated anemia or leukopenia suggest against a broader bone marrow process. PLAN -Would recommend avoiding NSAID use. -Recommended holding off on alcohol use. -We'll follow-up on hepatitis C and HIV testing. -No indication for treatment of her thrombocytopenia  this time even of this were ITP. -Reasonable to take a daily B complex to address any other possible vitamin B deficiencies. -Follow-up with primary care physician for management of other medical comorbidities  Return to care with Dr. Irene Limbo in 4 months with repeat labs to monitor platelet counts. Earlier if any other acute new concerns.  All of the patients questions were answered with apparent satisfaction. The patient knows to call the clinic with any problems, questions or concerns.  I spent 40 minutes counseling the patient face to face. The total time spent in the appointment was 50 minutes and more than 50% was on counseling and direct patient cares.    Sullivan Lone MD [place] [facility] Hematology/Oncology Physician [facility]  (Office):       [phone] (Work cell):  [phone] (Fax):           [phone]  04/19/2016 11:34 AM

## 2016-04-20 ENCOUNTER — Other Ambulatory Visit: Payer: Self-pay | Admitting: *Deleted

## 2016-04-20 ENCOUNTER — Telehealth: Payer: Self-pay | Admitting: *Deleted

## 2016-04-20 DIAGNOSIS — D696 Thrombocytopenia, unspecified: Secondary | ICD-10-CM

## 2016-04-20 LAB — HIV ANTIBODY (ROUTINE TESTING W REFLEX): HIV Screen 4th Generation wRfx: NONREACTIVE

## 2016-04-20 LAB — HEPATITIS C ANTIBODY

## 2016-04-20 NOTE — Telephone Encounter (Signed)
Per Dr. Irene Limbo, LVM with pt requesting call back to discuss lab results.  Plt count 83k improved from 02/2016.  Will recommend avoiding NSAIDS, avoiding ETOH, and starting empiric B complex 1t po daily.  Pt should return in 4 months for f/u & repeat labs.

## 2016-04-21 ENCOUNTER — Telehealth: Payer: Self-pay | Admitting: Hematology

## 2016-04-21 NOTE — Telephone Encounter (Signed)
sched appts for June per LOS. Letter mailed 2/16

## 2016-05-25 ENCOUNTER — Ambulatory Visit: Payer: 59 | Admitting: Neurology

## 2016-06-01 ENCOUNTER — Encounter: Payer: Self-pay | Admitting: Neurology

## 2016-06-18 ENCOUNTER — Ambulatory Visit (HOSPITAL_COMMUNITY)
Admission: EM | Admit: 2016-06-18 | Discharge: 2016-06-18 | Disposition: A | Discharge status: Home / Self Care | Payer: 59 | Attending: Family Medicine | Admitting: Family Medicine

## 2016-06-18 ENCOUNTER — Encounter (HOSPITAL_COMMUNITY): Payer: Self-pay | Admitting: *Deleted

## 2016-06-18 DIAGNOSIS — L03032 Cellulitis of left toe: Secondary | ICD-10-CM

## 2016-06-18 MED ORDER — CEPHALEXIN 500 MG PO CAPS: 500.0000 mg | ORAL_CAPSULE | Freq: Four times a day (QID) | ORAL | 0 refills | Status: DC

## 2016-06-18 NOTE — Discharge Instructions (Signed)
Your toe is infected. Soak it in salt water a couple times a day for the next 4-5 days. Take the antibiotic as directed. Call the podiatrist for an appointment.

## 2016-06-18 NOTE — ED Provider Notes (Signed)
CSN: [id number]     Arrival date & time 06/18/16  1430 History   First MD Initiated Contact with Patient 06/18/16 1502     Chief Complaint  Patient presents with  . Foot Pain   (Consider location/radiation/quality/duration/timing/severity/associated sxs/prior Treatment) 41 year old female complaining of pain and redness to the left second toe. She states she was trying to trim her nails about a week and a half ago and she believes she may have tram too deeply. She also tried to use a unclean utensil to scrape around the nail. Since that time she has developed mild swelling and erythema to the distal phalanx in surrounding the nail.      Past Medical History:  Diagnosis Date  . BV (bacterial vaginosis)   . Dysmenorrhea   . Fibroids   . Menorrhagia   . Pneumonia   . Seasonal allergies   . Trichomonas   . Yeast infection    Past Surgical History:  Procedure Laterality Date  . ABLATION    . EYE SURGERY    . HYSTEROSCOPY W/D&C    . TUBAL LIGATION    . WISDOM TOOTH EXTRACTION     Family History  Problem Relation Age of Onset  . GER disease Mother   . Alcoholism Father   . Cirrhosis Father   . Cancer Maternal Grandmother     unsure of type   Social History  Substance Use Topics  . Smoking status: Former Research scientist (life sciences)  . Smokeless tobacco: Never Used  . Alcohol use Yes     Comment: occasional   OB History    Gravida Para Term Preterm AB Living   4 3       3    SAB TAB Ectopic Multiple Live Births                 Review of Systems  Constitutional: Negative.   Respiratory: Negative.   Skin: Negative for rash.       As per history of present illness  Neurological: Negative.   All other systems reviewed and are negative.   Allergies  Ivp dye [iodinated diagnostic agents] and Bee venom  Home Medications   Prior to Admission medications   Medication Sig Start Date End Date Taking? Authorizing Provider  atorvastatin (LIPITOR) 40 MG tablet  04/11/16  Yes Historical  Provider, MD  atropine 1 % ophthalmic solution  01/10/16  Yes Historical Provider, MD  brimonidine (ALPHAGAN P) 0.1 % SOLN  06/21/15  Yes Historical Provider, MD  Carboxymethylcellul-Glycerin (LUBRICATING EYE DROPS OP) Apply 1-2 drops to eye as needed (dry eyes).   Yes Historical Provider, MD  clonazePAM (KLONOPIN) 0.5 MG tablet Take 1 tablet (0.5 mg total) by mouth 2 (two) times daily as needed for anxiety. 03/01/16  Yes Britt Bottom, MD  Cyanocobalamin (VITAMIN B-12 PO) Take by mouth.   Yes Historical Provider, MD  dorzolamide-timolol (COSOPT) 22.3-6.8 MG/ML ophthalmic solution  06/21/15  Yes Historical Provider, MD  fluticasone (FLONASE) 50 MCG/ACT nasal spray Place 1-2 sprays into both nostrils daily.   Yes Historical Provider, MD  latanoprost (XALATAN) 0.005 % ophthalmic solution  01/10/16  Yes Historical Provider, MD  SUMAtriptan (IMITREX) 100 MG tablet Take 1 tablet (100 mg total) by mouth once as needed for migraine. May repeat in 2 hours if headache persists or recurs. 06/24/15  Yes Britt Bottom, MD  verapamil (CALAN-SR) 240 MG CR tablet Take 1 tablet (240 mg total) by mouth at bedtime. 01/21/16  Yes Britt Bottom, MD  busPIRone (BUSPAR) 15 MG tablet Take 1 tablet (15 mg total) by mouth 2 (two) times daily. 01/21/16   Britt Bottom, MD  cephALEXin (KEFLEX) 500 MG capsule Take 1 capsule (500 mg total) by mouth 4 (four) times daily. 06/18/16   Janne Napoleon, NP  Multiple Vitamins-Minerals (MULTIVITAMIN WITH MINERALS) tablet Take 1 tablet by mouth daily.    Historical Provider, MD   Meds Ordered and Administered this Visit  Medications - No data to display  BP 122/68   Pulse 74   Temp 98 F (36.7 C) (Oral)   Resp 16   LMP 05/18/2016 (Approximate)   SpO2 98%  No data found.   Physical Exam  Constitutional: She appears well-developed and well-nourished. No distress.  Neck: Neck supple.  Cardiovascular: Normal rate.   Pulmonary/Chest: Effort normal.  Musculoskeletal: Normal range  of motion.  Left second toe with erythema and minor swelling to the distal phalanx and around the nail. Positive tenderness. No purulence is seen today. No evidence of paronychia today.  Skin: Skin is warm and dry.  Psychiatric: She has a normal mood and affect.  Nursing note and vitals reviewed.   Urgent Care Course     Procedures (including critical care time)  Labs Review Labs Reviewed - No data to display  Imaging Review No results found.   Visual Acuity Review  Right Eye Distance:   Left Eye Distance:   Bilateral Distance:    Right Eye Near:   Left Eye Near:    Bilateral Near:         MDM   1. Cellulitis of toe of left foot    Your toe is infected. Soak it in salt water a couple times a day for the next 4-5 days. Take the antibiotic as directed. Call the podiatrist for an appointment. Meds ordered this encounter  Medications  . cephALEXin (KEFLEX) 500 MG capsule    Sig: Take 1 capsule (500 mg total) by mouth 4 (four) times daily.    Dispense:  28 capsule    Refill:  0    Order Specific Question:   Supervising Provider    Answer:   Robyn Haber [[id number]]       Janne Napoleon, NP 06/18/16 1537

## 2016-06-18 NOTE — ED Triage Notes (Signed)
C/O left 2nd toe pain, redness, swelling.  Sx onset 2 wks ago.  Paronychia noted.  Pt has popped purulent area at home, applied abx oint and H2O2.

## 2016-06-26 ENCOUNTER — Other Ambulatory Visit: Payer: Self-pay | Admitting: Neurology

## 2016-07-11 ENCOUNTER — Encounter: Payer: Self-pay | Admitting: Neurology

## 2016-07-11 ENCOUNTER — Ambulatory Visit (INDEPENDENT_AMBULATORY_CARE_PROVIDER_SITE_OTHER): Payer: 59 | Admitting: Neurology

## 2016-07-11 VITALS — BP 128/82 | HR 71 | Resp 18 | Ht 61.0 in | Wt 185.0 lb

## 2016-07-11 DIAGNOSIS — G441 Vascular headache, not elsewhere classified: Secondary | ICD-10-CM

## 2016-07-11 DIAGNOSIS — R569 Unspecified convulsions: Secondary | ICD-10-CM

## 2016-07-11 DIAGNOSIS — R51 Headache: Secondary | ICD-10-CM

## 2016-07-11 DIAGNOSIS — R930 Abnormal findings on diagnostic imaging of skull and head, not elsewhere classified: Secondary | ICD-10-CM | POA: Diagnosis not present

## 2016-07-11 DIAGNOSIS — G8929 Other chronic pain: Secondary | ICD-10-CM

## 2016-07-11 MED ORDER — CLONAZEPAM 0.5 MG PO TABS: 0.5000 mg | ORAL_TABLET | Freq: Two times a day (BID) | ORAL | 1 refills | Status: DC | PRN

## 2016-07-11 MED ORDER — ACETAMINOPHEN-CODEINE #3 300-30 MG PO TABS: ORAL_TABLET | ORAL | 3 refills | Status: DC

## 2016-07-11 MED ORDER — VERAPAMIL HCL ER 240 MG PO TBCR: 240.0000 mg | EXTENDED_RELEASE_TABLET | Freq: Every day | ORAL | 3 refills | Status: DC

## 2016-07-11 MED ORDER — SUMATRIPTAN SUCCINATE 100 MG PO TABS: 100.0000 mg | ORAL_TABLET | Freq: Once | ORAL | 2 refills | Status: DC | PRN

## 2016-07-11 NOTE — Progress Notes (Signed)
[facility]  PATIENT: Angel Rodriguez DOB: [birth year]  REFERRING DOCTOR OR PCP:   SOURCE: patient, ED records, images on PACS, reports in EMR  _________________________________   HISTORICAL  CHIEF COMPLAINT:  Chief Complaint  Patient presents with  . Headaches    Sts. h/a's have been "pretty good."  Sts. she is not able to take Ibuprofen any longer due to low platelet counts.  Sts. she has had a negative hematology workup/fim    HISTORY OF PRESENT ILLNESS:  Angel Rodriguez is a 41 year old woman who has daily headaches.       HA:   Her headaches were doing much better but they are have been occurring more the past month or two. Additionally she continues to have conjunctival redness on the right.     They greatly improved after starting Verapamil and having occipital nerve block/splenius capitis muscle injection at the first visit and then [place] at the second, third and fourth visits.  After the second Sphenocath, the conjunctival redness got much better but then returned. After the fourth procedure, she went nearly pain-free for 6 months until milder headaches returned and activities slowly worsening. Pain improves  with Imitrex and rest much of the time.  She no longer can take NSAID    She tolerates the verapamil well but has occasional constipation .     Studies:   MRI of the brain was essentially normal with just a single small T2/FLAIR hyperintense focus in the left frontal subcortical white matter. The MR venogram was normal. ESR and ANA were normal.    Mood:  Anxiety is much better.   Clonazepam with Buspar has helped better than the escitalopram.    Seizure:   In March 2016, she had a seizure.   She had an aura of numbness in her right hand and then passed out.   She is not sure there was any generalized tonic-clonic activity. Her children did not tell her that she had any. However, she did have urinary incontinence. She does not remember much that day.     Since then, she has not had any more seizures.  She has not needed any AED as she had only the one seizure/spell.       Insomnia:   She is sleeping better since starting clonazepam and having much less headache  Thrombocytopenia:   Her platelet count was reduced to 78,000 and she saw her doctor Irene Limbo in hematology. Repeat platelet count was 83,000. She was advised to stop anti-inflammatories.  REVIEW OF SYSTEMS: Constitutional: No fevers, chills, sweats, or change in appetite.  No longer has insomnia Eyes: as above Ear, nose and throat: No hearing loss, ear pain, nasal congestion, sore throat Cardiovascular: No chest pain, palpitations Respiratory: No shortness of breath at rest or with exertion.   No wheezes GastrointestinaI: No nausea, vomiting, diarrhea, abdominal pain, fecal incontinence Genitourinary: No dysuria, urinary retention or frequency.  No nocturia. Musculoskeletal: No neck pain, back pain Integumentary: No rash, pruritus, skin lesions Neurological: as above Psychiatric: No depression at this time.  Notes anxiety, better on clonazepam and Buspar Endocrine: No palpitations, diaphoresis, change in appetite, change in weigh or increased thirst Hematologic/Lymphatic: No anemia, purpura, petechiae. Allergic/Immunologic: No itchy/runny eyes, nasal congestion, recent allergic reactions, rashes  ALLERGIES: Allergies  Allergen Reactions  . Ivp Dye [Iodinated Diagnostic Agents] Hives, Itching and Swelling  . Bee Venom Swelling    HOME MEDICATIONS:  Current Outpatient Prescriptions:  .  acetaminophen-codeine (TYLENOL #3) 300-30 MG  tablet, One to two po qd, Disp: 60 tablet, Rfl: 3 .  atorvastatin (LIPITOR) 40 MG tablet, , Disp: , Rfl:  .  atropine 1 % ophthalmic solution, , Disp: , Rfl:  .  brimonidine (ALPHAGAN P) 0.1 % SOLN, , Disp: , Rfl:  .  busPIRone (BUSPAR) 15 MG tablet, Take 1 tablet (15 mg total) by mouth 2 (two) times daily., Disp: 180 tablet, Rfl: 3 .   Carboxymethylcellul-Glycerin (LUBRICATING EYE DROPS OP), Apply 1-2 drops to eye as needed (dry eyes)., Disp: , Rfl:  .  cephALEXin (KEFLEX) 500 MG capsule, Take 1 capsule (500 mg total) by mouth 4 (four) times daily., Disp: 28 capsule, Rfl: 0 .  clonazePAM (KLONOPIN) 0.5 MG tablet, Take 1 tablet (0.5 mg total) by mouth 2 (two) times daily as needed. for anxiety, Disp: 180 tablet, Rfl: 1 .  Cyanocobalamin (VITAMIN B-12 PO), Take by mouth., Disp: , Rfl:  .  dorzolamide-timolol (COSOPT) 22.3-6.8 MG/ML ophthalmic solution, , Disp: , Rfl:  .  fluticasone (FLONASE) 50 MCG/ACT nasal spray, Place 1-2 sprays into both nostrils daily., Disp: , Rfl:  .  latanoprost (XALATAN) 0.005 % ophthalmic solution, , Disp: , Rfl:  .  Multiple Vitamins-Minerals (MULTIVITAMIN WITH MINERALS) tablet, Take 1 tablet by mouth daily., Disp: , Rfl:  .  SUMAtriptan (IMITREX) 100 MG tablet, Take 1 tablet (100 mg total) by mouth once as needed for migraine. May repeat in 2 hours if headache persists or recurs., Disp: 10 tablet, Rfl: 2 .  verapamil (CALAN-SR) 240 MG CR tablet, Take 1 tablet (240 mg total) by mouth at bedtime., Disp: 90 tablet, Rfl: 3 No current facility-administered medications for this visit.   Facility-Administered Medications Ordered in Other Visits:  .  gadopentetate dimeglumine (MAGNEVIST) injection 16 mL, 16 mL, Intravenous, Once PRN, Sater, Nanine Means, MD  PAST MEDICAL HISTORY: Past Medical History:  Diagnosis Date  . BV (bacterial vaginosis)   . Dysmenorrhea   . Fibroids   . Menorrhagia   . Pneumonia   . Seasonal allergies   . Trichomonas   . Yeast infection     PAST SURGICAL HISTORY: Past Surgical History:  Procedure Laterality Date  . ABLATION    . EYE SURGERY    . HYSTEROSCOPY W/D&C    . TUBAL LIGATION    . WISDOM TOOTH EXTRACTION      FAMILY HISTORY: Family History  Problem Relation Age of Onset  . GER disease Mother   . Alcoholism Father   . Cirrhosis Father   . Cancer Maternal  Grandmother     unsure of type    SOCIAL HISTORY:  Social History   Social History  . Marital status: Single    Spouse name: N/A  . Number of children: N/A  . Years of education: N/A   Occupational History  . Not on file.   Social History Main Topics  . Smoking status: Former Research scientist (life sciences)  . Smokeless tobacco: Never Used  . Alcohol use Yes     Comment: occasional  . Drug use: No  . Sexual activity: Not on file     Comment: btl   Other Topics Concern  . Not on file   Social History Narrative  . No narrative on file     PHYSICAL EXAM  Vitals:   07/11/16 1612  BP: 128/82  Pulse: 71  Resp: 18  Weight: 185 lb (83.9 kg)  Height: '5\' 1"'$  (1.549 m)    Body mass index is 34.96 kg/m.   General:  The patient is well-developed and well-nourished and in no acute distress  Eyes:    There is conjunctival redness on the right   Neck: The neck is supple.  The neck is now nontender with good ROM  Musculoskeletal:  Back is nontender  Neurologic Exam  Mental status: The patient is alert and oriented x 3 at the time of the examination. The patient has apparent normal recent and remote memory, with an apparently normal attention span and concentration ability.   Speech is normal.  Cranial nerves: Extraocular movements are full.   Mild right ptosis is noted. There is normal facial sensation to soft touch.  Facial strength is normal.  Trapezius and sternocleidomastoid strength is normal. No dysarthria is noted.  The tongue is midline, and the patient has symmetric elevation of the soft palate. No obvious hearing deficits are noted.  Motor:  Muscle bulk is normal.   Tone is normal. Strength is  5 / 5 in all 4 extremities.    Gait and station: Station is normal.   Gait is normal. Tandem gait is normal. Romberg is negative.       DIAGNOSTIC DATA (LABS, IMAGING, TESTING) - I reviewed patient records, labs, notes, testing and imaging myself where available.  Lab Results  Component  Value Date   WBC 10.6 (H) 04/19/2016   HGB 13.5 04/19/2016   HCT 39.0 04/19/2016   MCV 87.8 04/19/2016   PLT 83 (L) 04/19/2016      Component Value Date/Time   NA 138 04/19/2016 1246   K 3.9 04/19/2016 1246   CL 111 05/30/2014 1215   CO2 21 (L) 04/19/2016 1246   GLUCOSE 111 04/19/2016 1246   BUN 7.0 04/19/2016 1246   CREATININE 0.7 04/19/2016 1246   CALCIUM 9.9 04/19/2016 1246   PROT 7.8 04/19/2016 1246   ALBUMIN 4.3 04/19/2016 1246   AST 15 04/19/2016 1246   ALT 19 04/19/2016 1246   ALKPHOS 61 04/19/2016 1246   BILITOT 0.46 04/19/2016 1246   GFRNONAA >90 05/30/2014 1215   GFRAA >90 05/30/2014 1215    ________________________  Procedure: The patient was placed in the supine position. A temperature strip was added to the cheek area after the area was cleaned with alcohol. The Sphenocath was lubricated with gel, and placed in the right naris. The catheter was inserted above the middle turbinate to the posterior nasal cavity, and then withdrawn 1 cm. The catheter was deployed and rotated approximately 20 towards the nose. 2-1/2 mL of 2% lidocaine was deployed. The patient was asked to swallow during the injection. The patient demonstrated erythema of the sclera of the eye on the righ side, and an increase in the cheek temperature was noted from 34C to 36.5C.  She tolerated the procedure well. Afterwards, she reported much less pressure sensation in the head.   There were no complications      ASSESSMENT AND PLAN  Chronic intractable headache, unspecified headache type  Vascular headache  Convulsions, unspecified convulsion type (HCC)  Abnormal MRI of head   1.   Continue verapamil for the headaches with Imitrex for breakthrough 2.   Sphenocath procedure on the right.   This resulted in a significant improvement of the right-sided headache and pressure sensation 3.    Continue clonazepam 0.5 mg twice a day for anxiety and insomnia.    4.   She will return in 6 months  or sooner if there are new or worsening symptoms.  Richard A. Felecia Shelling, MD, PhD 07/11/2016,  1:42 PM Certified in Neurology, Clinical Neurophysiology, Sleep Medicine, Pain Medicine and Neuroimaging  [facility] [address] [place], [place] [address] [phone]

## 2016-08-18 ENCOUNTER — Other Ambulatory Visit: Payer: 59

## 2016-08-18 ENCOUNTER — Ambulatory Visit: Payer: 59 | Admitting: Hematology

## 2016-08-18 ENCOUNTER — Telehealth: Payer: Self-pay | Admitting: Hematology

## 2016-08-18 ENCOUNTER — Telehealth: Payer: Self-pay

## 2016-08-18 NOTE — Telephone Encounter (Signed)
Telephone encounter.

## 2016-08-18 NOTE — Telephone Encounter (Signed)
Scheduled appt per 6/15 sch message - patient aware of new aptp date and time.

## 2016-08-30 ENCOUNTER — Other Ambulatory Visit: Payer: Self-pay

## 2016-08-30 DIAGNOSIS — D696 Thrombocytopenia, unspecified: Secondary | ICD-10-CM

## 2016-08-31 ENCOUNTER — Ambulatory Visit (HOSPITAL_BASED_OUTPATIENT_CLINIC_OR_DEPARTMENT_OTHER): Payer: 59 | Admitting: Hematology

## 2016-08-31 ENCOUNTER — Other Ambulatory Visit (HOSPITAL_BASED_OUTPATIENT_CLINIC_OR_DEPARTMENT_OTHER): Payer: 59

## 2016-08-31 ENCOUNTER — Telehealth: Payer: Self-pay | Admitting: Hematology

## 2016-08-31 ENCOUNTER — Encounter: Payer: Self-pay | Admitting: Hematology

## 2016-08-31 VITALS — BP 119/64 | HR 66 | Temp 98.4°F | Resp 20 | Ht 61.0 in | Wt 183.1 lb

## 2016-08-31 DIAGNOSIS — D696 Thrombocytopenia, unspecified: Secondary | ICD-10-CM

## 2016-08-31 LAB — COMPREHENSIVE METABOLIC PANEL
ALT: 23 U/L (ref 0–55)
AST: 18 U/L (ref 5–34)
Albumin: 3.9 g/dL (ref 3.5–5.0)
Alkaline Phosphatase: 62 U/L (ref 40–150)
Anion Gap: 12 mEq/L — ABNORMAL HIGH (ref 3–11)
BUN: 8.6 mg/dL (ref 7.0–26.0)
CHLORIDE: 104 meq/L (ref 98–109)
CO2: 22 meq/L (ref 22–29)
CREATININE: 0.8 mg/dL (ref 0.6–1.1)
Calcium: 10.3 mg/dL (ref 8.4–10.4)
EGFR: >90 mL/min/{1.73_m2} (ref >90)
Glucose: 106 mg/dl (ref 70–140)
POTASSIUM: 3.9 meq/L (ref 3.5–5.1)
Sodium: 138 mEq/L (ref 136–145)
Total Bilirubin: 0.43 mg/dL (ref 0.20–1.20)
Total Protein: 7.6 g/dL (ref 6.4–8.3)

## 2016-08-31 LAB — CBC WITH DIFFERENTIAL/PLATELET
BASO%: 0.2 % (ref 0.0–2.0)
Basophils Absolute: 0 10*3/uL (ref 0.0–0.1)
EOS%: 2 % (ref 0.0–7.0)
Eosinophils Absolute: 0.1 10*3/uL (ref 0.0–0.5)
HCT: 40.2 % (ref 34.8–46.6)
HGB: 13.7 g/dL (ref 11.6–15.9)
LYMPH#: 2.4 10*3/uL (ref 0.9–3.3)
LYMPH%: 38 % (ref 14.0–49.7)
MCH: 30.5 pg (ref 25.1–34.0)
MCHC: 34.1 g/dL (ref 31.5–36.0)
MCV: 89.5 fL (ref 79.5–101.0)
MONO#: 0.3 10*3/uL (ref 0.1–0.9)
MONO%: 5.3 % (ref 0.0–14.0)
NEUT#: 3.5 10*3/uL (ref 1.5–6.5)
NEUT%: 54.5 % (ref 38.4–76.8)
Platelets: 71 10*3/uL — ABNORMAL LOW (ref 145–400)
RBC: 4.49 10*6/uL (ref 3.70–5.45)
RDW: 13.2 % (ref 11.2–14.5)
WBC: 6.4 10*3/uL (ref 3.9–10.3)

## 2016-08-31 NOTE — Telephone Encounter (Signed)
Gave patient avs report and appointments for October  °

## 2016-08-31 NOTE — Patient Instructions (Signed)
Thank you for choosing [facility] to provide your oncology and hematology care.  To afford each patient quality time with our providers, please arrive 30 minutes before your scheduled appointment time.  If you arrive late for your appointment, you may be asked to reschedule.  We strive to give you quality time with our providers, and arriving late affects you and other patients whose appointments are after yours.  If you are a no show for multiple scheduled visits, you may be dismissed from the clinic at the providers discretion.   Again, thank you for choosing [facility], our hope is that these requests will decrease the amount of time that you wait before being seen by our physicians.  ______________________________________________________________________ Should you have questions after your visit to the [facility], please contact our office at [phone] between the hours of 8:30 and 4:30 p.m.    Voicemails left after 4:30p.m will not be returned until the following business day.   For prescription refill requests, please have your pharmacy contact us directly.  Please also try to allow 48 hours for prescription requests.   Please contact the scheduling department for questions regarding scheduling.  For scheduling of procedures such as PET scans, CT scans, MRI, Ultrasound, etc please contact central scheduling at [phone].   Resources For Cancer Patients and Caregivers:  American Cancer Society:  800-227-2345  Can help patients locate various types of support and financial assistance Cancer Care: 1-800-813-HOPE (4673) Provides financial assistance, online support groups, medication/co-pay assistance.   [place] DSS:  336-641-3447 Where to apply for food stamps, Medicaid, and utility assistance Medicare Rights Center: 800-333-4114 Helps people with Medicare understand their rights and benefits, navigate the Medicare system, and secure the  quality healthcare they deserve SCAT: 336-333-6589  [employer]'s shared-ride transportation service for eligible riders who have a disability that prevents them from riding the fixed route bus.   For additional information on assistance programs please contact our social worker:   Grier Hock/Abigail Elmore:  [phone] 

## 2016-10-03 NOTE — Progress Notes (Signed)
[name]    HEMATOLOGY/ONCOLOGY CLINIC NOTE  Date of Service: 10/03/2016  Patient Care Team: Damaris Hippo, MD as PCP - General (Family Medicine)  CHIEF COMPLAINTS/PURPOSE OF CONSULTATION:  Thrombocytopenia.  HISTORY OF PRESENTING ILLNESS:   Angel Rodriguez is a wonderful 41 y.o. female who has been referred to [place] by Dr .Damaris Hippo, MD  for evaluation and management of thrombocytopenia.  Patient has a history of obesity, anxiety, menorrhagia related to urinary tract infection as well as had a history of mild thrombocytopenia for at least 1-2 years. She was noted to have a platelet count of 99k in March 2016 with a normal hemoglobin and WBC count. She recently had routine labs with her primary care physician on 02/16/2016 and was noted to have a platelet count of 78k with a hemoglobin of 13.1 and a WBC count of 7.5k.  Patient notes some easy bruisability but no overt bleeding. No nosebleeds no, bleeds no GI bleeding or hematuria. Does tend to have menorrhagia related to uterine fibroids but notes that this is much improved since her endometrial ablation.  No fever no chills no night sweats. Has been gaining some weight but is trying to work on this. Has had issues with intermittent cluster headaches for which she has been on a few prednisone bursts.  She does use NSAIDs on an off for dysmenorrhea . Denies any other significant new medications. Has previously had some issues with excessive alcohol use but notes that she has been drinking only a couple of drinks a week recently. Smoking about 5 cigarettes a week. Denies any other drug abuse.  INTERVAL HISTORY  Patient is here for her scheduled follow-up for thrombocytopenia. She notes no issues with bruising or overt bleeding. Her platelet counts are down from 83k to 71k. Denies any new medications. No recent viral infections.   MEDICAL HISTORY:  Past Medical History:  Diagnosis Date  . BV (bacterial vaginosis)   . Dysmenorrhea   .  Fibroids   . Menorrhagia   . Pneumonia   . Seasonal allergies   . Trichomonas   . 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 Unclear etiology. Patient notes she had extensive workup which was unrevealing. Patient notes she has minimal vision in that eye. Obesity Dyslipidemia Anxiety Cluster headaches - follows with [facility] and [facility].   SURGICAL HISTORY: Past Surgical History:  Procedure Laterality Date  . ABLATION    . EYE SURGERY    . HYSTEROSCOPY W/D&C    . TUBAL LIGATION    . WISDOM TOOTH EXTRACTION      SOCIAL HISTORY: Social History   Social History  . Marital status: Single    Spouse name: N/A  . Number of children: N/A  . Years of education: N/A   Occupational History  . Not on file.   Social History Main Topics  . Smoking status: Former Research scientist (life sciences)  . Smokeless tobacco: Never Used  . Alcohol use Yes     Comment: occasional  . Drug use: No  . Sexual activity: Not on file     Comment: btl   Other Topics Concern  . Not on file   Social History Narrative  . No narrative on file  Drinks about 2 drinks a week.  FAMILY HISTORY: Family History  Problem Relation Age of Onset  . GER disease Mother   . Alcoholism Father   . Cirrhosis Father   . Cancer Maternal Grandmother  unsure of type    ALLERGIES:  is allergic to ivp dye [iodinated diagnostic agents] and bee venom.  MEDICATIONS:  Current Outpatient Prescriptions  Medication Sig Dispense Refill  . acetaminophen-codeine (TYLENOL #3) 300-30 MG tablet One to two po qd 60 tablet 3  . atorvastatin (LIPITOR) 40 MG tablet     . atropine 1 % ophthalmic solution     . brimonidine (ALPHAGAN P) 0.1 % SOLN     . busPIRone (BUSPAR) 15 MG tablet Take 1 tablet (15 mg total) by mouth 2 (two) times daily. 180 tablet 3  . Carboxymethylcellul-Glycerin (LUBRICATING EYE DROPS OP) Apply 1-2 drops to eye as needed (dry eyes).    .  cephALEXin (KEFLEX) 500 MG capsule Take 1 capsule (500 mg total) by mouth 4 (four) times daily. 28 capsule 0  . clonazePAM (KLONOPIN) 0.5 MG tablet Take 1 tablet (0.5 mg total) by mouth 2 (two) times daily as needed. for anxiety 180 tablet 1  . Cyanocobalamin (VITAMIN B-12 PO) Take by mouth.    . dorzolamide-timolol (COSOPT) 22.3-6.8 MG/ML ophthalmic solution     . fluticasone (FLONASE) 50 MCG/ACT nasal spray Place 1-2 sprays into both nostrils daily.    [name] latanoprost (XALATAN) 0.005 % ophthalmic solution     . Multiple Vitamins-Minerals (MULTIVITAMIN WITH MINERALS) tablet Take 1 tablet by mouth daily.    . SUMAtriptan (IMITREX) 100 MG tablet Take 1 tablet (100 mg total) by mouth once as needed for migraine. May repeat in 2 hours if headache persists or recurs. 10 tablet 2  . verapamil (CALAN-SR) 240 MG CR tablet Take 1 tablet (240 mg total) by mouth at bedtime. 90 tablet 3   No current facility-administered medications for this visit.    Facility-Administered Medications Ordered in Other Visits  Medication Dose Route Frequency Provider Last Rate Last Dose  . gadopentetate dimeglumine (MAGNEVIST) injection 16 mL  16 mL Intravenous Once PRN Sater, Nanine Means, MD        REVIEW OF SYSTEMS:    10 Point review of Systems was done is negative except as noted above.  PHYSICAL EXAMINATION: ECOG PERFORMANCE STATUS: 0 - Asymptomatic  . Vitals:   08/31/16 1019  BP: 119/64  Pulse: 66  Resp: 20  Temp: 98.4 F (36.9 C)   Filed Weights   08/31/16 1019  Weight: 183 lb 1.6 oz (83.1 kg)   .Body mass index is 34.6 kg/m.  GENERAL:alert, in no acute distress and comfortable SKIN: skin color, texture, turgor are normal, no rashes or significant lesions EYES: normal, conjunctiva are pink and non-injected, sclera clear OROPHARYNX:no exudate, no erythema and lips, buccal mucosa, and tongue normal  NECK: supple, no JVD, thyroid normal size, non-tender, without nodularity LYMPH:  no palpable  lymphadenopathy in the cervical, axillary or inguinal LUNGS: clear to auscultation with normal respiratory effort HEART: regular rate & rhythm,  no murmurs and no lower extremity edema ABDOMEN: abdomen soft, non-tender, normoactive bowel sounds No palpable hepatosplenomegaly Musculoskeletal: no cyanosis of digits and no clubbing  PSYCH: alert & oriented x 3 with fluent speech NEURO: no focal motor/sensory deficits  LABORATORY DATA:  I have reviewed the data as listed  . CBC Latest Ref Rng & Units 08/31/2016 04/19/2016 05/30/2014  WBC 3.9 - 10.3 10e3/uL 6.4 10.6(H) 11.3(H)  Hemoglobin 11.6 - 15.9 g/dL 13.7 13.5 15.0  Hematocrit 34.8 - 46.6 % 40.2 39.0 44.2  Platelets 145 - 400 10e3/uL 71(L) 83(L) 99(L)    . CMP Latest Ref Rng & Units 08/31/2016 04/19/2016  05/30/2014  Glucose 70 - 140 mg/dl 106 111 113(H)  BUN 7.0 - 26.0 mg/dL 8.6 7.0 9  Creatinine 0.6 - 1.1 mg/dL 0.8 0.7 0.74  Sodium 136 - 145 mEq/L 138 138 140  Potassium 3.5 - 5.1 mEq/L 3.9 3.9 5.1  Chloride 96 - 112 mmol/L - - 111  CO2 22 - 29 mEq/L 22 21(L) 22  Calcium 8.4 - 10.4 mg/dL 10.3 9.9 9.6  Total Protein 6.4 - 8.3 g/dL 7.6 7.8 7.3  Total Bilirubin 0.20 - 1.20 mg/dL 0.43 0.46 0.6  Alkaline Phos 40 - 150 U/L 62 61 57  AST 5 - 34 U/L 18 15 40(H)  ALT 0 - 55 U/L 23 19 31      RADIOGRAPHIC STUDIES: I have personally reviewed the radiological images as listed and agreed with the findings in the report. No results found.  ASSESSMENT & PLAN:   41 year old female with  #1 Mild-Moderate thrombocytopenia.  No associated anemia. Borderline elevation of WBC count which could be due to steroid use. Her platelets were about 99k in March 2016. 78k in December 2017 and was previously at [facility] and today is down to 71k No issues with overt bleeding. LDH within normal limits. No symptoms suggestive of lymphoproliferative process. Peripheral blood smear showed no platelet clumping few large platelets. Some decrease in platelets could  be due to increased consumption from her heavy periods. Patients age and Absence of associated anemia or leukopenia suggest against a broader bone marrow process. 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 Component     Latest Ref Rng & Units 04/19/2016  LDH     125 - 245 U/L 171  Hep C Virus Ab     0.0 - 0.9 s/co ratio <0.1  HIV Screen 4th Generation wRfx     Non Reactive Non Reactive   PLAN -Continue to avoid NSAID use. -Recommended holding off on alcohol use. - hepatitis C and HIV testing neg -No indication for treatment of her thrombocytopenia this time even of this were ITP. -Reasonable to take a daily B complex to address any other possible vitamin B deficiencies. -Follow-up with primary care physician for management of other medical comorbidities  Return to care with Dr. Irene Limbo in 4 months with repeat labs to monitor platelet counts. Earlier if any other acute new concerns.  All of the patients questions were answered with apparent satisfaction. The patient knows to call the clinic with any problems, questions or concerns.  I spent 20 minutes counseling the patient face to face. The total time spent in the appointment was 25 minutes and more than 50% was on counseling and direct patient cares.    Sullivan Lone MD [place] AAHIVMS [facility] [place] Hematology/Oncology Physician [facility]  (Office):       [phone] (Work cell):  [phone] (Fax):           [phone]

## 2016-12-06 ENCOUNTER — Other Ambulatory Visit: Payer: Self-pay | Admitting: Neurology

## 2016-12-28 ENCOUNTER — Other Ambulatory Visit: Payer: 59

## 2016-12-28 ENCOUNTER — Telehealth: Payer: Self-pay

## 2016-12-28 ENCOUNTER — Ambulatory Visit: Payer: 59 | Admitting: Hematology

## 2016-12-28 NOTE — Telephone Encounter (Signed)
Called pt to inquire why she was not at her appt this morning. Per Audie Clear (scheduling) pt was given AVS with appointments on 6/28 after her appt with Dr. Irene Limbo. Pt stated, "I did not receive a reminder call from your office. I would have been there if I had known." Appointments cancelled today and scheduling message sent to reschedule pt for labs and doctor visit.

## 2017-01-02 ENCOUNTER — Telehealth: Payer: Self-pay | Admitting: Hematology

## 2017-01-02 NOTE — Telephone Encounter (Signed)
Rescheduled patient for appointment.

## 2017-01-09 ENCOUNTER — Other Ambulatory Visit: Payer: Self-pay

## 2017-01-09 ENCOUNTER — Ambulatory Visit: Payer: Self-pay | Admitting: Hematology

## 2017-01-11 ENCOUNTER — Ambulatory Visit: Payer: 59 | Admitting: Neurology

## 2017-01-12 ENCOUNTER — Encounter: Payer: Self-pay | Admitting: Neurology

## 2017-01-27 ENCOUNTER — Other Ambulatory Visit: Payer: Self-pay | Admitting: Neurology

## 2017-02-01 ENCOUNTER — Telehealth: Payer: Self-pay

## 2017-02-01 DIAGNOSIS — Z0289 Encounter for other administrative examinations: Secondary | ICD-10-CM

## 2017-02-01 MED ORDER — VERAPAMIL HCL ER 240 MG PO TBCR: 240.0000 mg | EXTENDED_RELEASE_TABLET | Freq: Every day | ORAL | 0 refills | Status: DC

## 2017-02-01 NOTE — Addendum Note (Signed)
Addended by: France Ravens I on: 02/01/2017 04:51 PM   Modules accepted: Orders

## 2017-02-01 NOTE — Telephone Encounter (Signed)
Patient called to pay her no show fees and reschedule her appointment. Dr. Garth Bigness next available is not until Feb so we scheduled but she is wondering if she can get refills on her clonazepam, Tylenol #3, and verapamil to get her to her appointment. She uses [facility] on [address].

## 2017-02-01 NOTE — Telephone Encounter (Signed)
Spoke with [name].  She noshowed her last ov with RAS.  Advised Verapamil escribed to [facility], but he is not able to refill controlled substances until she is seen again.  She verbalized understanding of same/fim

## 2017-03-29 ENCOUNTER — Ambulatory Visit (INDEPENDENT_AMBULATORY_CARE_PROVIDER_SITE_OTHER): Payer: PRIVATE HEALTH INSURANCE | Admitting: Certified Nurse Midwife

## 2017-03-29 ENCOUNTER — Other Ambulatory Visit (HOSPITAL_COMMUNITY)
Admission: RE | Admit: 2017-03-29 | Discharge: 2017-03-29 | Disposition: A | Discharge status: Home / Self Care | Payer: PRIVATE HEALTH INSURANCE | Source: Ambulatory Visit | Attending: Certified Nurse Midwife | Admitting: Certified Nurse Midwife

## 2017-03-29 ENCOUNTER — Other Ambulatory Visit: Payer: Self-pay

## 2017-03-29 ENCOUNTER — Inpatient Hospital Stay (HOSPITAL_COMMUNITY)
Admission: AD | Admit: 2017-03-29 | Discharge: 2017-03-29 | Disposition: A | Discharge status: Home / Self Care | Payer: PRIVATE HEALTH INSURANCE | Source: Ambulatory Visit | Attending: Obstetrics and Gynecology | Admitting: Obstetrics and Gynecology

## 2017-03-29 ENCOUNTER — Encounter (HOSPITAL_COMMUNITY): Payer: Self-pay

## 2017-03-29 ENCOUNTER — Encounter: Payer: Self-pay | Admitting: Certified Nurse Midwife

## 2017-03-29 ENCOUNTER — Inpatient Hospital Stay (HOSPITAL_COMMUNITY): Payer: PRIVATE HEALTH INSURANCE

## 2017-03-29 VITALS — BP 100/60 | HR 64 | Resp 16 | Ht 60.25 in | Wt 175.0 lb

## 2017-03-29 DIAGNOSIS — D500 Iron deficiency anemia secondary to blood loss (chronic): Secondary | ICD-10-CM | POA: Diagnosis not present

## 2017-03-29 DIAGNOSIS — Z8669 Personal history of other diseases of the nervous system and sense organs: Secondary | ICD-10-CM | POA: Diagnosis not present

## 2017-03-29 DIAGNOSIS — N852 Hypertrophy of uterus: Secondary | ICD-10-CM

## 2017-03-29 DIAGNOSIS — D649 Anemia, unspecified: Secondary | ICD-10-CM

## 2017-03-29 DIAGNOSIS — Z862 Personal history of diseases of the blood and blood-forming organs and certain disorders involving the immune mechanism: Secondary | ICD-10-CM

## 2017-03-29 DIAGNOSIS — D259 Leiomyoma of uterus, unspecified: Secondary | ICD-10-CM | POA: Diagnosis not present

## 2017-03-29 DIAGNOSIS — Z124 Encounter for screening for malignant neoplasm of cervix: Secondary | ICD-10-CM

## 2017-03-29 DIAGNOSIS — Z01419 Encounter for gynecological examination (general) (routine) without abnormal findings: Secondary | ICD-10-CM

## 2017-03-29 DIAGNOSIS — N939 Abnormal uterine and vaginal bleeding, unspecified: Secondary | ICD-10-CM

## 2017-03-29 DIAGNOSIS — F17210 Nicotine dependence, cigarettes, uncomplicated: Secondary | ICD-10-CM | POA: Insufficient documentation

## 2017-03-29 DIAGNOSIS — N921 Excessive and frequent menstruation with irregular cycle: Secondary | ICD-10-CM | POA: Diagnosis not present

## 2017-03-29 DIAGNOSIS — D250 Submucous leiomyoma of uterus: Secondary | ICD-10-CM | POA: Diagnosis not present

## 2017-03-29 DIAGNOSIS — Z3202 Encounter for pregnancy test, result negative: Secondary | ICD-10-CM | POA: Insufficient documentation

## 2017-03-29 LAB — URINALYSIS, MICROSCOPIC (REFLEX): WBC UA: NONE SEEN WBC/hpf (ref 0–5)

## 2017-03-29 LAB — CBC WITH DIFFERENTIAL/PLATELET
BASOS PCT: 0 %
Basophils Absolute: 0 10*3/uL (ref 0.0–0.1)
EOS ABS: 0.1 10*3/uL (ref 0.0–0.7)
EOS PCT: 1 %
HCT: 24.1 % — ABNORMAL LOW (ref 36.0–46.0)
HEMOGLOBIN: 8.2 g/dL — AB (ref 12.0–15.0)
Lymphocytes Relative: 31 %
Lymphs Abs: 2.9 10*3/uL (ref 0.7–4.0)
MCH: 30.9 pg (ref 26.0–34.0)
MCHC: 34 g/dL (ref 30.0–36.0)
MCV: 90.9 fL (ref 78.0–100.0)
MONOS PCT: 4 %
Monocytes Absolute: 0.3 10*3/uL (ref 0.1–1.0)
NEUTROS PCT: 64 %
Neutro Abs: 6.1 10*3/uL (ref 1.7–7.7)
PLATELETS: 76 10*3/uL — AB (ref 150–400)
RBC: 2.65 MIL/uL — AB (ref 3.87–5.11)
RDW: 13.2 % (ref 11.5–15.5)
WBC: 9.6 10*3/uL (ref 4.0–10.5)

## 2017-03-29 LAB — URINALYSIS, ROUTINE W REFLEX MICROSCOPIC

## 2017-03-29 LAB — RETICULOCYTES
RBC.: 2.65 MIL/uL — AB (ref 3.87–5.11)
RETIC CT PCT: 3.4 % — AB (ref 0.4–3.1)
Retic Count, Absolute: 90.1 10*3/uL (ref 19.0–186.0)

## 2017-03-29 LAB — ABO/RH: ABO/RH(D): A POS

## 2017-03-29 LAB — HEMOGLOBIN: Hemoglobin: 8.1

## 2017-03-29 LAB — TYPE AND SCREEN
ABO/RH(D): A POS
Antibody Screen: NEGATIVE

## 2017-03-29 LAB — POCT PREGNANCY, URINE: Preg Test, Ur: NEGATIVE

## 2017-03-29 IMAGING — US US PELVIS COMPLETE TRANSABD/TRANSVAG
2 series · 14 of 25 positions shown · non-contrast
Comparison: None

CLINICAL DATA: Intermittent vaginal bleeding for 2 months with
heavy bleeding since yesterday, irregular cycle for 2 months

EXAM:
TRANSABDOMINAL AND TRANSVAGINAL ULTRASOUND OF PELVIS
TECHNIQUE: Both transabdominal and transvaginal ultrasound examinations of the
pelvis were performed. Transabdominal technique was performed for
global imaging of the pelvis including uterus, ovaries, adnexal
regions, and pelvic cul-de-sac. It was necessary to proceed with
endovaginal exam following the transabdominal exam to visualize the
endometrial complex and uterus. Transvaginal imaging limited by
enlargement of uterus secondary to multiple leiomyomata.

[Series 1: us pelvis complete transabd/transvag · 13 of 48 slices shown (1 of 2)]
[im 1/48]
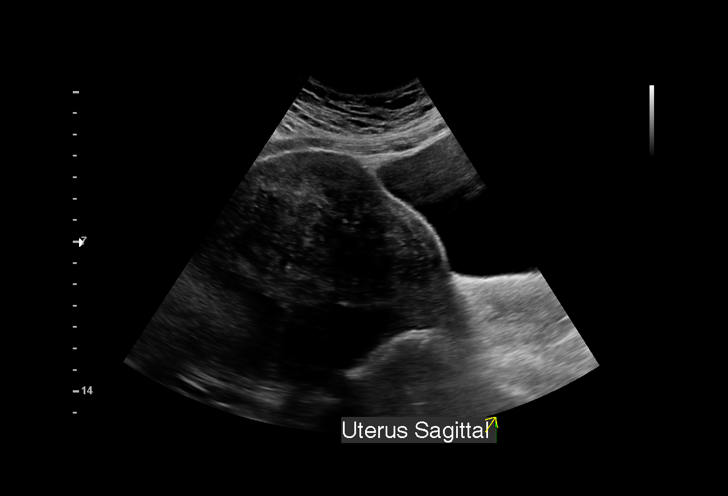
[im 5/48]
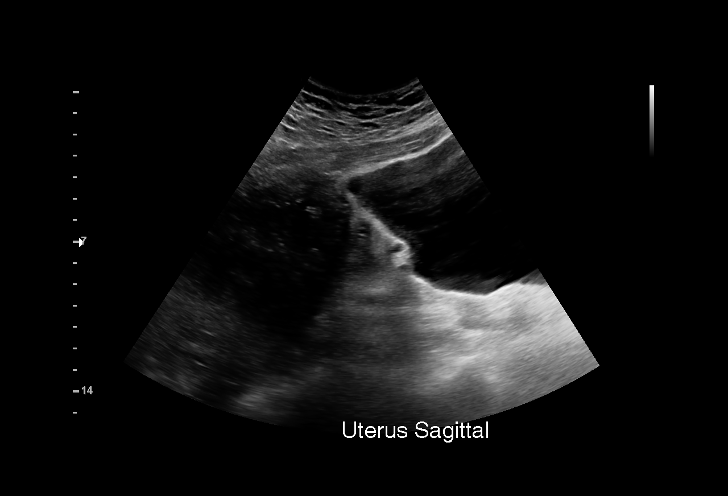
[im 9/48]
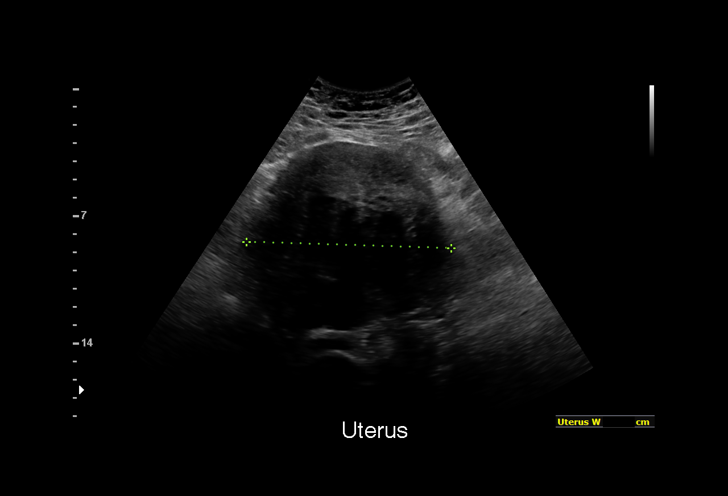
[im 13/48]
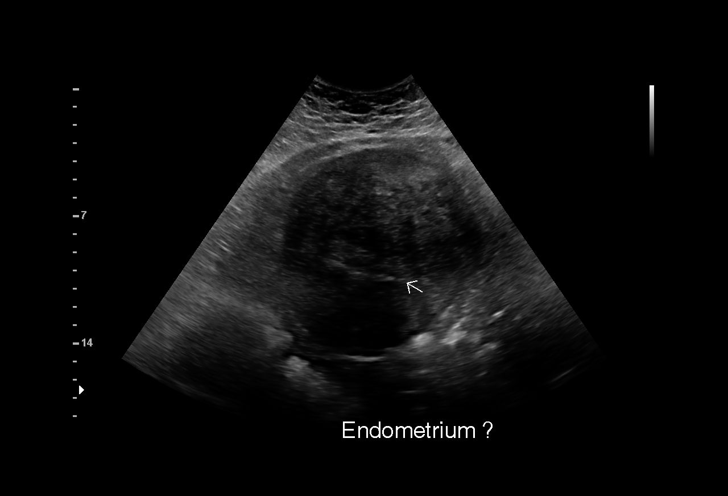
[im 17/48]
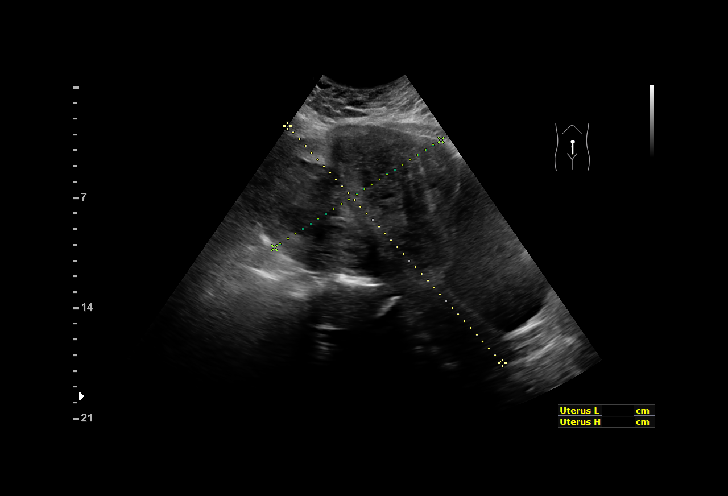
[im 19/48]
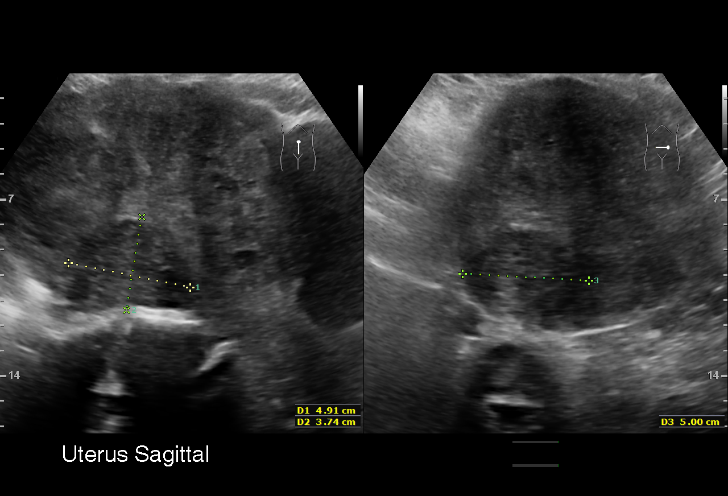
[im 23/48]
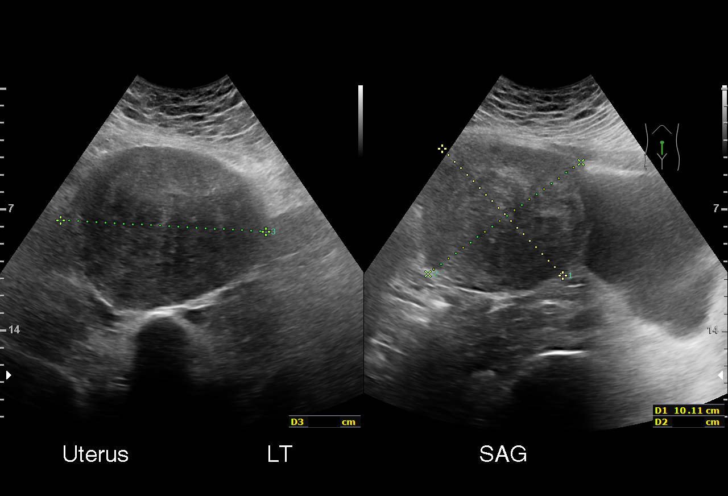
[im 27/48]
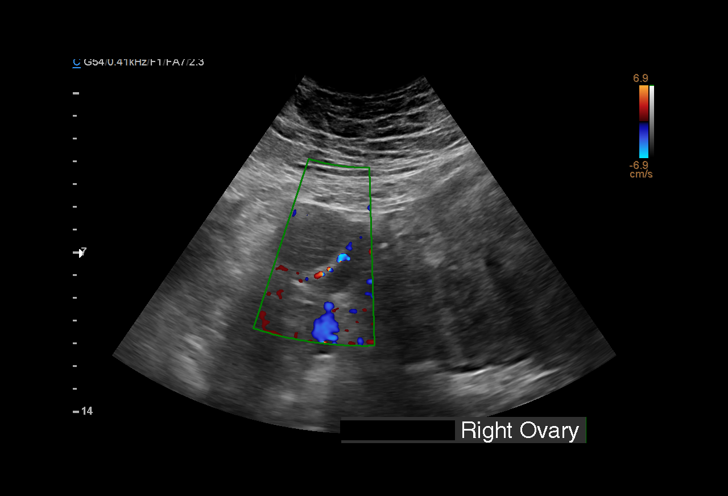
[im 31/48]
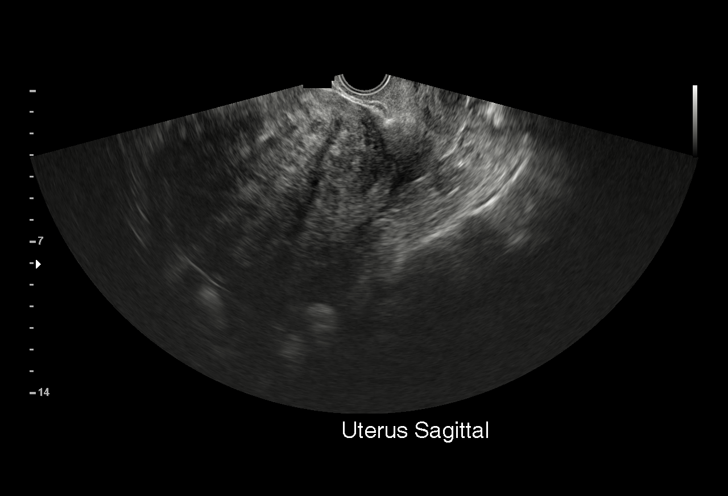
[im 33/48]
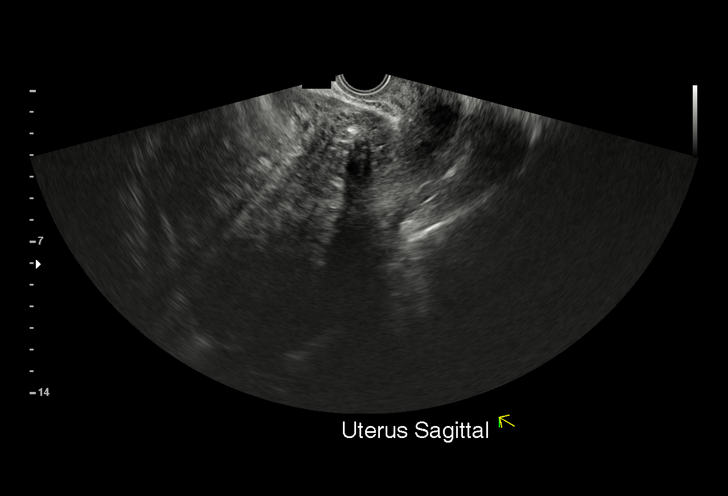
[im 37/48]
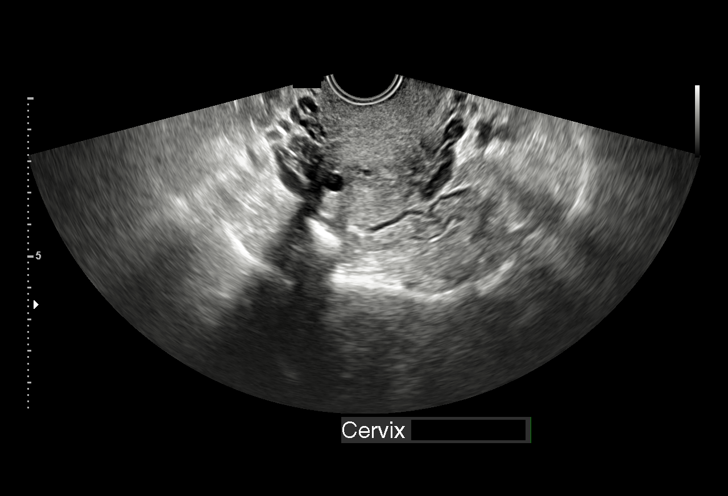
[im 41/48]
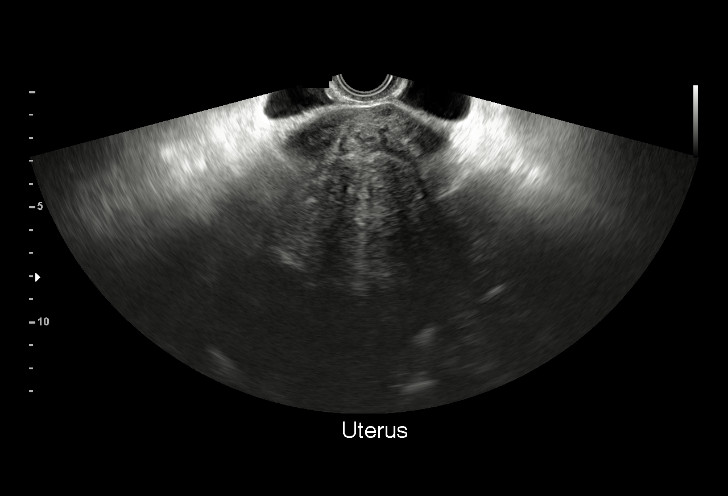
[im 45/48]
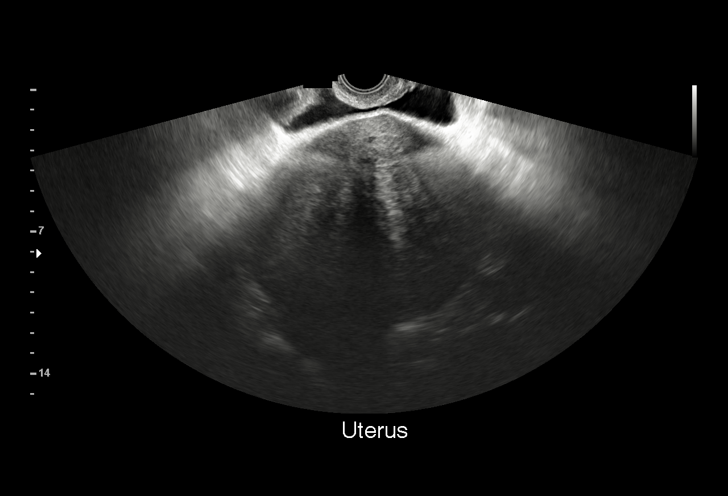

[Series 2: us pelvis complete transabd/transvag · 1 of 3 slices shown (2 of 2)]
[im 1/3]
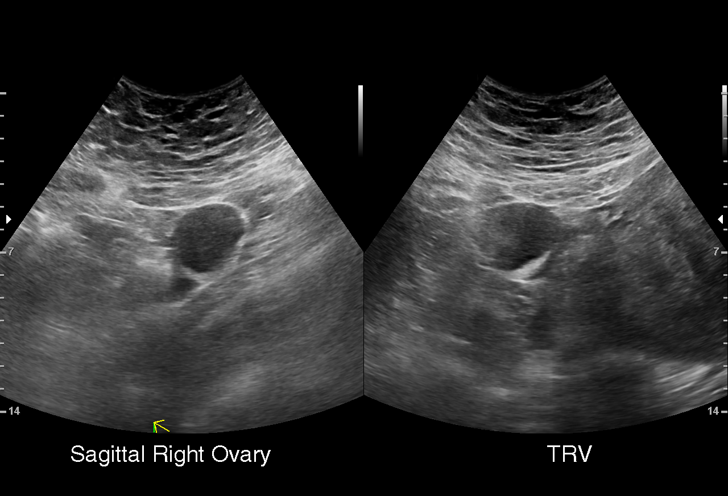

[14 of 25 positions shown; findings below may reference images not displayed]

FINDINGS: Uterus

Measurements: 20.3 x 12.6 x 11.3 cm. Enlarged heterogeneous uterus
with nodular contours complex likely representing multiple uterine
leiomyomata. Large anterior wall upper uterine leiomyoma 10.1 x
x 11.9 cm. Smaller leiomyomata at the lateral mid RIGHT uterus 4.9 x
3.7 x 5.0 cm and 5.4 x 5.2 x 6.3 cm.

Endometrium

Endometrial complex suboptimally visualized due to distortion of the
uterus by multiple large uterine leiomyomata which extends
submucosal, question 5 mm thick

Right ovary

Measurements: 5.3 x 3.4 x 3.0 cm. Seen only on trans abdominal
imaging likely due to enlarged uterus. Complicated cystic lesion
replacing RIGHT ovary 5.3 x 3.4 x 3.0 cm containing a smooth
slightly thick septation versus 2 adjacent cystic lesions.

Left ovary

Measurements: 4.3 x 2.3 x 2.7 cm. Seen only on transabdominal
imaging likely due to enlarged uterus. No gross mass identified.

Other findings

No free pelvic fluid or other adnexal masses.
IMPRESSION: Markedly enlarged uterus containing multiple leiomyomata up to
cm diameter, some of which extend submucosal.

Complicated RIGHT ovarian cyst RIGHT ovary 5.3 cm greatest size,
likely benign but requiring follow-up imaging; recommend follow-up
sonography in 6-12 weeks to reassess.

## 2017-03-29 MED ORDER — POLYSACCHARIDE IRON COMPLEX 150 MG PO CAPS: 150.0000 mg | ORAL_CAPSULE | Freq: Every day | ORAL | 0 refills | Status: DC

## 2017-03-29 MED ORDER — LACTATED RINGERS IV BOLUS (SEPSIS)
1000.0000 mL | Freq: Once | INTRAVENOUS | Status: AC
  Administered 2017-03-29: 1000 mL via INTRAVENOUS

## 2017-03-29 MED ORDER — NORETHINDRONE ACETATE 5 MG PO TABS: 5.0000 mg | ORAL_TABLET | Freq: Two times a day (BID) | ORAL | 0 refills | Status: DC

## 2017-03-29 NOTE — [facility] Note (Signed)
Pt reports heavy vaginal bleeding off/on x 2 months, heavy bleeding x 3 days. Today in office her Hgb was 7, sent over for labs and u/s

## 2017-03-29 NOTE — Progress Notes (Signed)
42 y.o. [id number] ( one ectopic) Single  American [place]  Fe here for annual exam. Periods are sometimes monthly no issues after ablation (10-11 years ago). Now has periods more frequently with spotting continuously over the last two months with  heavier bleeding. Two weeks ago had heavy bleeding with need to change pad every hour and also used diapers to keep from soiling bed. Heavy bleeding again today, but has not needed diaper.No cramping. Eating less, and has felt weak, with no appetite.. Ate better yesterday, but has only had fluids today. History of fibroids but "had them scraped" and "thought they were removed. Feeling weak again this am. She almost fainted earlier.. Patient had been seen by Hematology at [facility] for low platelets and management there. Last appt. 6/18, treated with medication and has not been back for follow up. Bleeding today is heavy to moderate" not like two days ago". Contraception BTL. Previously seen at [facility], appointment in one year or more.  Sees Neurology for headache management with Klonipin and Buspar and has not had pain medication recently. Drove herself here today and happy she can be seen and helped with her problems. 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 Denies vomiting, fever or loose stools or abdominal pain. No other issues at this time.  .Patient's last menstrual period was 03/27/2017 (exact date).          Sexually active: Yes.    The current method of family planning is tubal ligation.    Exercising: No.  exercise Smoker:  no  Health Maintenance: Pap:  2018  History of Abnormal Pap: yes   MMG:  2018 normal per patient Self Breast exams: yes Colonoscopy:  none BMD:   none TDaP:  utd per patient Shingles: no Pneumonia: no Hep C and HIV: both neg 2018 Labs: yes   reports that she has been smoking e-cigarettes.  she has never used smokeless tobacco. She reports that she drinks alcohol. She  reports that she does not use drugs.  Past Medical History:  Diagnosis Date  . Abnormal Pap smear of cervix   . Anxiety   . BV (bacterial vaginosis)   . Cluster headache   . Depression   . Dysmenorrhea   . Fibroid   . Fibroids   . Menorrhagia   . Migraines   . Pneumonia   . Seasonal allergies   . Seizure ([place])   . Trichomonas   . Yeast infection     Past Surgical History:  Procedure Laterality Date  . ABLATION    . COLPOSCOPY    . EYE SURGERY    . HYSTEROSCOPY W/D&C    . TUBAL LIGATION    . WISDOM TOOTH EXTRACTION      Current Outpatient Medications  Medication Sig Dispense Refill  . acetaminophen-codeine (TYLENOL #3) 300-30 MG tablet One to two po qd 60 tablet 3  . atorvastatin (LIPITOR) 40 MG tablet     . atropine 1 % ophthalmic solution     . brimonidine (ALPHAGAN P) 0.1 % SOLN     . brimonidine (ALPHAGAN P) 0.1 % SOLN Alphagan P 0.1 % eye drops    . busPIRone (BUSPAR) 15 MG tablet Take 1 tablet (15 mg total) by mouth 2 (two) times daily. 180 tablet 3  . clonazePAM (KLONOPIN) 0.5 MG tablet Take 1 tablet (0.5 mg total) by mouth 2 (two) times daily as needed. for anxiety 180 tablet 1  . Cyanocobalamin (  VITAMIN B-12 PO) Take by mouth.    . dorzolamide-timolol (COSOPT) 22.3-6.8 MG/ML ophthalmic solution     . fluticasone (FLONASE) 50 MCG/ACT nasal spray Place 1-2 sprays into both nostrils daily.    [name] moxifloxacin (VIGAMOX) 0.5 % ophthalmic solution Vigamox 0.5 % eye drops  INSTILL 1 DROP INTO AFFECTED EYE(S) BY OPHTHALMIC ROUTE 3 TIMES PER DAY    . Multiple Vitamin (MULTI-VITAMINS) TABS Take by mouth.    . SUMAtriptan (IMITREX) 100 MG tablet Take 1 tablet (100 mg total) by mouth once as needed for migraine. May repeat in 2 hours if headache persists or recurs. 10 tablet 2  . verapamil (CALAN-SR) 240 MG CR tablet Take 1 tablet (240 mg total) by mouth at bedtime. 90 tablet 0  . vitamin B-12 (CYANOCOBALAMIN) 1000 MCG tablet Take by mouth as needed.     No current  facility-administered medications for this visit.    Facility-Administered Medications Ordered in Other Visits  Medication Dose Route Frequency Provider Last Rate Last Dose  . gadopentetate dimeglumine (MAGNEVIST) injection 16 mL  16 mL Intravenous Once PRN Sater, Nanine Means, MD        Family History  Problem Relation Age of Onset  . GER disease Mother   . Alcoholism Father   . Cirrhosis Father   . Cancer Maternal Grandmother        unsure of type    ROS:  Pertinent items are noted in HPI.  Otherwise, a comprehensive ROS was negative.  Exam:   BP 100/60   Pulse 64   Resp 16   Ht 5' 0.25" (1.53 m)   Wt 175 lb (79.4 kg)   LMP 03/27/2017 (Exact Date)   BMI 33.89 kg/m  Height: 5' 0.25" (153 cm) Ht Readings from Last 3 Encounters:  03/29/17 5' 0.25" (1.53 m)  08/31/16 5\' 1"  (1.549 m)  07/11/16 5\' 1"  (1.549 m)    General appearance: slightly not coherent with questions, cooperative and appears stated age Head: Normocephalic, without obvious abnormality, atraumatic Neck: no adenopathy, supple, symmetrical, trachea midline and thyroid normal to inspection and palpation Lungs: clear to auscultation bilaterally Breasts: normal appearance, no masses or tenderness, No nipple retraction or dimpling, No nipple discharge or bleeding, No axillary or supraclavicular adenopathy Heart: regular rate and rhythm Abdomen: soft, non-tender; mass noted probable uterine enlargement  Extremities: extremities normal, atraumatic, no cyanosis or edema Skin: Skin color pale, texture, turgor normal. No rashes or lesions Lymph nodes: Cervical, supraclavicular, and axillary nodes normal. No abnormal inguinal nodes palpated Neurologic: Grossly normal   Pelvic: External genitalia:  no lesions, normal female              Urethra:  normal appearing urethra with no masses, tenderness or lesions              Bartholin's and Skene's: normal                 Vagina: normal appearing vagina with normal color   no lesions, moderate to large amount of bright blood noted in vaginal vault,(after removal of pad and two tampons saturated, cleared x 3 for cervical look, with moderate bleeding to small in reclining position. No clotting noted              Cervix: multiparous appearance, no cervical motion tenderness and no lesions blood present from cervix              Pap taken: Yes.   Bimanual Exam:  Uterus:  enlarged,  18-20 weeks size and firm              Adnexa: unable to palpate due to abdominal mass, suspect fibroids               Rectovaginal: Confirms               Anus:  normal sphincter tone, no lesions  Chaperone present: yes  A:  Well Woman with normal exam  Contraception BTL  History of fibroids   Enlarged uterus 18- 20 week size/firm with moderate bleeding at present   Menorrhagia with DUB and regular cycles  Anemia per results today of 8.0 Hgb, symptomatic with poor appetite and weakness  History of uterine ablation >10 years  History of Migraine headache no aura  History of Thrombocytopenia with treatment in past.  History of Retinal vein occlusion right eye, under treatment  History of depression    P:   Reviewed health and wellness pertinent to exam  Discussed uterine enlargement and need for evaluation with PUS and to  control of bleeding. Patient aware. Discussed Uterine enlargement with probable fibroids is the reason for increase for bleeding, but will need evaluation and treatment. Discussed with patient she may need hospital admission and IV fluids due to hydration state and anemia. This is why she is feeling weak and dizzy. Patient would be agreeable. She has no one to be with her, mother works 3rd shift and is sleeping. Children in school. She drove herself.  Discussed anemia noted today and will continue to occur with bleeding and may need medication to help with this or possible surgical intervention, once problem determined and possible blood transfusion. Patient agreeable if  needed.   Will discuss with Dr. Sabra Heck for management plan( she is on call) and patient will be advised. Will send to [facility] for further evaluation for menorrhagia and anemia.Discussed recommendations with patient, agreeable. Questions addressed. Patient ambulating with out dizziness after eating here and fluids po. Patient prefers to drive and will be escorted to car.   Pap smear: yes  counseled on breast self exam, mammography screening, diet importance, and follow up with MD as indicated.  Note from blood draw, patient felt faint and was put in reclining position, BP 80/60 and felt better. Patient given po fluids and crackers, felt better. BP 100/62 P70. Color pale. Patient reclined and exam done and answered questions very slowly. Slightly confused at initial meeting, but improved with fluids and food. Will help with dressing and maintain reclining. Patient up walking feels better now and stable. BP 118/70 P-68.  return annually or prn  An After Visit Summary was printed and given to the patient.

## 2017-03-29 NOTE — Addendum Note (Signed)
Addended by: Regina Eck on: 03/29/2017 02:26 PM   Modules accepted: Orders

## 2017-03-29 NOTE — Discharge Instructions (Signed)
Abnormal Uterine Bleeding °Abnormal uterine bleeding means bleeding more than usual from your uterus. It can include: °· Bleeding between periods. °· Bleeding after sex. °· Bleeding that is heavier than normal. °· Periods that last longer than usual. °· Bleeding after you have stopped having your period (menopause). ° °There are many problems that may cause this. You should see a doctor for any kind of bleeding that is not normal. Treatment depends on the cause of the bleeding. °Follow these instructions at home: °· Watch your condition for any changes. °· Do not use tampons, douche, or have sex, if your doctor tells you not to. °· Change your pads often. °· Get regular well-woman exams. Make sure they include a pelvic exam and cervical cancer screening. °· Keep all follow-up visits as told by your doctor. This is important. °Contact a doctor if: °· The bleeding lasts more than one week. °· You feel dizzy at times. °· You feel like you are going to throw up (nauseous). °· You throw up. °Get help right away if: °· You pass out. °· You have to change pads every hour. °· You have belly (abdominal) pain. °· You have a fever. °· You get sweaty. °· You get weak. °· You passing large blood clots from your vagina. °Summary °· Abnormal uterine bleeding means bleeding more than usual from your uterus. °· There are many problems that may cause this. You should see a doctor for any kind of bleeding that is not normal. °· Treatment depends on the cause of the bleeding. °This information is not intended to replace advice given to you by your health care provider. Make sure you discuss any questions you have with your health care provider. °Document Released: 12/18/2008 Document Revised: 02/15/2016 Document Reviewed: 02/15/2016 °Elsevier Interactive Patient Education © 2017 [employer] ° ° °Uterine Fibroids °Uterine fibroids are tissue masses (tumors) that can develop in the womb (uterus). They are also called leiomyomas. This  type of tumor is not cancerous (benign) and does not spread to other parts of the body outside of the pelvic area, which is between the hip bones. Occasionally, fibroids may develop in the fallopian tubes, in the cervix, or on the support structures (ligaments) that surround the uterus. °You can have one or many fibroids. Fibroids can vary in size, weight, and where they grow in the uterus. Some can become quite large. Most fibroids do not require medical treatment. °What are the causes? °A fibroid can develop when a single uterine cell keeps growing (replicating). Most cells in the human body have a control mechanism that keeps them from replicating without control. °What are the signs or symptoms? °Symptoms may include: °· Heavy bleeding during your period. °· Bleeding or spotting between periods. °· Pelvic pain and pressure. °· Bladder problems, such as needing to urinate more often (urinary frequency) or urgently. °· Inability to reproduce offspring (infertility). °· Miscarriages. ° °How is this diagnosed? °Uterine fibroids are diagnosed through a physical exam. Your health care provider may feel the lumpy tumors during a pelvic exam. Ultrasonography and an MRI may be done to determine the size, location, and number of fibroids. °How is this treated? °Treatment may include: °· Watchful waiting. This involves getting the fibroid checked by your health care provider to see if it grows or shrinks. Follow your health care provider's recommendations for how often to have this checked. °· Hormone medicines. These can be taken by mouth or given through an intrauterine device (IUD). °· Surgery. °? Removing   the fibroids (myomectomy) or the uterus (hysterectomy). °? Removing blood supply to the fibroids (uterine artery embolization). ° °If fibroids interfere with your fertility and you want to become pregnant, your health care provider may recommend having the fibroids removed. °Follow these instructions at home: °· Keep  all follow-up visits as directed by your health care provider. This is important. °· Take over-the-counter and prescription medicines only as told by your health care provider. °? If you were prescribed a hormone treatment, take the hormone medicines exactly as directed. °· Ask your health care provider about taking iron pills and increasing the amount of dark green, leafy vegetables in your diet. These actions can help to boost your blood iron levels, which may be affected by heavy menstrual bleeding. °· Pay close attention to your period and tell your health care provider about any changes, such as: °? Increased blood flow that requires you to use more pads or tampons than usual per month. °? A change in the number of days that your period lasts per month. °? A change in symptoms that are associated with your period, such as abdominal cramping or back pain. °Contact a health care provider if: °· You have pelvic pain, back pain, or abdominal cramps that cannot be controlled with medicines. °· You have an increase in bleeding between and during periods. °· You soak tampons or pads in a half hour or less. °· You feel lightheaded, extra tired, or weak. °Get help right away if: °· You faint. °· You have a sudden increase in pelvic pain. °This information is not intended to replace advice given to you by your health care provider. Make sure you discuss any questions you have with your health care provider. °Document Released: 02/18/2000 Document Revised: 10/21/2015 Document Reviewed: 08/19/2013 °Elsevier Interactive Patient Education © 2018 [employer] Inc[employer] ° ° °

## 2017-03-29 NOTE — MAU Provider Note (Signed)
History     CSN: [id number]  Arrival date and time: 03/29/17 1112  First Provider Initiated Contact with Patient 03/29/17 1202     Chief Complaint  Patient presents with  . Back Pain  . Vaginal Bleeding   42 y.o. non-pregnant female sent from office with heavy VB. Bleeding has been intermittent for the last several months. Over the last few days she has been changing tampons every 15-30 minutes. She has hx of endometrial ablation about 10 years ago. She is not using contraception. Married, monogamous partner.   Past Medical History:  Diagnosis Date  . Abnormal Pap smear of cervix   . Anxiety   . Blind right eye    swelling & pressure  . BV (bacterial vaginosis)   . Cluster headache   . Depression   . Dysmenorrhea   . Eye disorder   . Fibroid   . Fibroids   . Menorrhagia   . Migraines   . Pneumonia   . Seasonal allergies   . Seizure ([place])   . Trichomonas   . Yeast infection     Past Surgical History:  Procedure Laterality Date  . ABLATION    . COLPOSCOPY    . EYE SURGERY    . HYSTEROSCOPY W/D&C    . TUBAL LIGATION    . WISDOM TOOTH EXTRACTION      Family History  Problem Relation Age of Onset  . Alcoholism Father   . Cirrhosis Father   . Cancer Maternal Grandmother        unsure of type    Social History   Tobacco Use  . Smoking status: Current Some Day Smoker    Types: E-cigarettes  . Smokeless tobacco: Never Used  Substance Use Topics  . Alcohol use: Yes    Comment: occasional  . Drug use: No    Allergies:  Allergies  Allergen Reactions  . Ivp Dye [Iodinated Diagnostic Agents] Hives, Itching and Swelling  . Bee Venom Swelling    Medications Prior to Admission  Medication Sig Dispense Refill Last Dose  . acetaminophen (TYLENOL) 500 MG tablet Take 500 mg by mouth every 6 (six) hours as needed for mild pain or headache.   03/28/2017 at Unknown time  . acetaminophen-codeine (TYLENOL #3) 300-30 MG tablet One to two po qd 60 tablet 3 Past Month  at Unknown time  . atorvastatin (LIPITOR) 40 MG tablet Take 40 mg by mouth daily at 6 PM.    03/28/2017 at Unknown time  . atropine 1 % ophthalmic solution Place 1 drop into the right eye 3 (three) times daily.    03/29/2017 at Unknown time  . brimonidine (ALPHAGAN P) 0.1 % SOLN Place 1 drop into the right eye every 8 (eight) hours.    03/29/2017 at Unknown time  . clonazePAM (KLONOPIN) 0.5 MG tablet Take 1 tablet (0.5 mg total) by mouth 2 (two) times daily as needed. for anxiety 180 tablet 1 Past Month at Unknown time  . Cyanocobalamin (VITAMIN B-12 PO) Take 1 tablet by mouth daily.    Past Week at Unknown time  . dorzolamide-timolol (COSOPT) 22.3-6.8 MG/ML ophthalmic solution Place 1 drop into the right eye 2 (two) times daily.    03/29/2017 at Unknown time  . fluticasone (FLONASE) 50 MCG/ACT nasal spray Place 1-2 sprays into both nostrils daily.   Past Week at Unknown time  . moxifloxacin (VIGAMOX) 0.5 % ophthalmic solution Vigamox 0.5 % eye drops  INSTILL 1 DROP INTO AFFECTED EYE(S) BY OPHTHALMIC  ROUTE 3 TIMES PER DAY   03/28/2017 at Unknown time  . Multiple Vitamin (MULTI-VITAMINS) TABS Take 1 tablet by mouth daily.    03/28/2017 at Unknown time  . SUMAtriptan (IMITREX) 100 MG tablet Take 1 tablet (100 mg total) by mouth once as needed for migraine. May repeat in 2 hours if headache persists or recurs. 10 tablet 2 Past Week at Unknown time  . verapamil (CALAN-SR) 240 MG CR tablet Take 1 tablet (240 mg total) by mouth at bedtime. 90 tablet 0 03/28/2017 at Unknown time  . busPIRone (BUSPAR) 15 MG tablet Take 1 tablet (15 mg total) by mouth 2 (two) times daily. (Patient taking differently: Take 15 mg by mouth 2 (two) times daily as needed. ) 180 tablet 3 couple months    Review of Systems  Gastrointestinal: Negative for abdominal pain.  Genitourinary: Positive for vaginal bleeding.  Neurological: Positive for light-headedness.   Physical Exam   Blood pressure 108/65, pulse 91, temperature 98.5 F  (36.9 C), temperature source Oral, resp. rate 15, height 5' 0.25" (1.53 m), weight 175 lb (79.4 kg), last menstrual period 03/27/2017, SpO2 97 %.  Patient Vitals for the past 24 hrs:  BP Temp Temp src Pulse Resp SpO2 Height Weight  03/29/17 1207 108/65 - - 91 - - - -  03/29/17 1205 105/61 - - 84 - - - -  03/29/17 1203 115/65 - - 80 - - - -  03/29/17 1134 (!) 111/55 98.5 F (36.9 C) Oral 78 15 97 % 5' 0.25" (1.53 m) 175 lb (79.4 kg)   Physical Exam  Constitutional: She is oriented to person, place, and time. She appears well-developed and well-nourished. No distress.  HENT:  Head: Normocephalic and atraumatic.  Neck: Normal range of motion.  Respiratory: Effort normal. No respiratory distress.  GI: Soft. She exhibits no distension and no mass. There is no tenderness. There is no rebound and no guarding.  Genitourinary:  Genitourinary Comments: External: no lesions or erythema Vagina: rugated, pink, moist, small drk red blood in vault, small clot x1, cleared with 1 fox swab Uterus: + enlarged, anteverted, non tender, no CMT Adnexae: no masses, no tenderness left, no tenderness right   Musculoskeletal: Normal range of motion.  Neurological: She is alert and oriented to person, place, and time.  Skin: Skin is warm and dry.  Psychiatric: She has a normal mood and affect.   Results for orders placed or performed during the hospital encounter of 03/29/17 (from the past 24 hour(s))  Urinalysis, Routine w reflex microscopic     Status: Abnormal   Collection Time: 03/29/17 11:36 AM  Result Value Ref Range   Color, Urine AMBER (A) YELLOW   APPearance CLEAR CLEAR   Specific Gravity, Urine  1.005 - 1.030    TEST NOT REPORTED DUE TO COLOR INTERFERENCE OF URINE PIGMENT   pH  5.0 - 8.0    TEST NOT REPORTED DUE TO COLOR INTERFERENCE OF URINE PIGMENT   Glucose, UA (A) NEGATIVE mg/dL    TEST NOT REPORTED DUE TO COLOR INTERFERENCE OF URINE PIGMENT   Hgb urine dipstick (A) NEGATIVE    TEST NOT  REPORTED DUE TO COLOR INTERFERENCE OF URINE PIGMENT   Bilirubin Urine (A) NEGATIVE    TEST NOT REPORTED DUE TO COLOR INTERFERENCE OF URINE PIGMENT   Ketones, ur (A) NEGATIVE mg/dL    TEST NOT REPORTED DUE TO COLOR INTERFERENCE OF URINE PIGMENT   Protein, ur (A) NEGATIVE mg/dL    TEST NOT REPORTED  DUE TO COLOR INTERFERENCE OF URINE PIGMENT   Nitrite (A) NEGATIVE    TEST NOT REPORTED DUE TO COLOR INTERFERENCE OF URINE PIGMENT   Leukocytes, UA (A) NEGATIVE    TEST NOT REPORTED DUE TO COLOR INTERFERENCE OF URINE PIGMENT  Urinalysis, Microscopic (reflex)     Status: Abnormal   Collection Time: 03/29/17 11:36 AM  Result Value Ref Range   RBC / HPF TOO NUMEROUS TO COUNT 0 - 5 RBC/hpf   WBC, UA NONE SEEN 0 - 5 WBC/hpf   Bacteria, UA FEW (A) NONE SEEN   Squamous Epithelial / LPF 0-5 (A) NONE SEEN  CBC with Differential/Platelet     Status: Abnormal   Collection Time: 03/29/17 11:47 AM  Result Value Ref Range   WBC 9.6 4.0 - 10.5 K/uL   RBC 2.65 (L) 3.87 - 5.11 MIL/uL   Hemoglobin 8.2 (L) 12.0 - 15.0 g/dL   HCT 24.1 (L) 36.0 - 46.0 %   MCV 90.9 78.0 - 100.0 fL   MCH 30.9 26.0 - 34.0 pg   MCHC 34.0 30.0 - 36.0 g/dL   RDW 13.2 11.5 - 15.5 %   Platelets 76 (L) 150 - 400 K/uL   Neutrophils Relative % 64 %   Neutro Abs 6.1 1.7 - 7.7 K/uL   Lymphocytes Relative 31 %   Lymphs Abs 2.9 0.7 - 4.0 K/uL   Monocytes Relative 4 %   Monocytes Absolute 0.3 0.1 - 1.0 K/uL   Eosinophils Relative 1 %   Eosinophils Absolute 0.1 0.0 - 0.7 K/uL   Basophils Relative 0 %   Basophils Absolute 0.0 0.0 - 0.1 K/uL  Reticulocytes     Status: Abnormal   Collection Time: 03/29/17 11:47 AM  Result Value Ref Range   Retic Ct Pct 3.4 (H) 0.4 - 3.1 %   RBC. 2.65 (L) 3.87 - 5.11 MIL/uL   Retic Count, Absolute 90.1 19.0 - 186.0 K/uL  Type and screen     Status: None   Collection Time: 03/29/17 11:47 AM  Result Value Ref Range   ABO/RH(D) A POS    Antibody Screen NEG    Sample Expiration 04/01/2017   Pregnancy,  urine POC     Status: None   Collection Time: 03/29/17 11:51 AM  Result Value Ref Range   Preg Test, Ur NEGATIVE NEGATIVE  ABO/Rh     Status: None   Collection Time: 03/29/17 12:22 PM  Result Value Ref Range   ABO/RH(D) A POS    US Pelvic Complete With Transvaginal  Result Date: 03/29/2017 CLINICAL DATA:  Intermittent vaginal bleeding for 2 months with heavy bleeding since yesterday, irregular cycle for 2 months EXAM: TRANSABDOMINAL AND TRANSVAGINAL ULTRASOUND OF PELVIS TECHNIQUE: Both transabdominal and transvaginal ultrasound examinations of the pelvis were performed. Transabdominal technique was performed for global imaging of the pelvis including uterus, ovaries, adnexal regions, and pelvic cul-de-sac. It was necessary to proceed with endovaginal exam following the transabdominal exam to visualize the endometrial complex and uterus. Transvaginal imaging limited by enlargement of uterus secondary to multiple leiomyomata. COMPARISON:  None FINDINGS: Uterus Measurements: 20.3 x 12.6 x 11.3 cm. Enlarged heterogeneous uterus with nodular contours complex likely representing multiple uterine leiomyomata. Large anterior wall upper uterine leiomyoma 10.1 x 11.1 x 11.9 cm. Smaller leiomyomata at the lateral mid RIGHT uterus 4.9 x 3.7 x 5.0 cm and 5.4 x 5.2 x 6.3 cm. Endometrium Endometrial complex suboptimally visualized due to distortion of the uterus by multiple large uterine leiomyomata which extends submucosal,  question 5 mm thick Right ovary Measurements: 5.3 x 3.4 x 3.0 cm. Seen only on trans abdominal imaging likely due to enlarged uterus. Complicated cystic lesion replacing RIGHT ovary 5.3 x 3.4 x 3.0 cm containing a smooth slightly thick septation versus 2 adjacent cystic lesions. Left ovary Measurements: 4.3 x 2.3 x 2.7 cm. Seen only on transabdominal imaging likely due to enlarged uterus. No gross mass identified. Other findings No free pelvic fluid or other adnexal masses. IMPRESSION: Markedly  enlarged uterus containing multiple leiomyomata up to 11.9 cm diameter, some of which extend submucosal. Complicated RIGHT ovarian cyst RIGHT ovary 5.3 cm greatest size, likely benign but requiring follow-up imaging; recommend follow-up sonography in 6-12 weeks to reassess. Electronically Signed   By: Lavonia Dana M.D.   On: 03/29/2017 14:52   US Pelvic Complete With Transvaginal  Result Date: 03/29/2017 CLINICAL DATA:  Intermittent vaginal bleeding for 2 months with heavy bleeding since yesterday, irregular cycle for 2 months EXAM: TRANSABDOMINAL AND TRANSVAGINAL ULTRASOUND OF PELVIS TECHNIQUE: Both transabdominal and transvaginal ultrasound examinations of the pelvis were performed. Transabdominal technique was performed for global imaging of the pelvis including uterus, ovaries, adnexal regions, and pelvic cul-de-sac. It was necessary to proceed with endovaginal exam following the transabdominal exam to visualize the endometrial complex and uterus. Transvaginal imaging limited by enlargement of uterus secondary to multiple leiomyomata. COMPARISON:  None FINDINGS: Uterus Measurements: 20.3 x 12.6 x 11.3 cm. Enlarged heterogeneous uterus with nodular contours complex likely representing multiple uterine leiomyomata. Large anterior wall upper uterine leiomyoma 10.1 x 11.1 x 11.9 cm. Smaller leiomyomata at the lateral mid RIGHT uterus 4.9 x 3.7 x 5.0 cm and 5.4 x 5.2 x 6.3 cm. Endometrium Endometrial complex suboptimally visualized due to distortion of the uterus by multiple large uterine leiomyomata which extends submucosal, question 5 mm thick Right ovary Measurements: 5.3 x 3.4 x 3.0 cm. Seen only on trans abdominal imaging likely due to enlarged uterus. Complicated cystic lesion replacing RIGHT ovary 5.3 x 3.4 x 3.0 cm containing a smooth slightly thick septation versus 2 adjacent cystic lesions. Left ovary Measurements: 4.3 x 2.3 x 2.7 cm. Seen only on transabdominal imaging likely due to enlarged uterus. No  gross mass identified. Other findings No free pelvic fluid or other adnexal masses. IMPRESSION: Markedly enlarged uterus containing multiple leiomyomata up to 11.9 cm diameter, some of which extend submucosal. Complicated RIGHT ovarian cyst RIGHT ovary 5.3 cm greatest size, likely benign but requiring follow-up imaging; recommend follow-up sonography in 6-12 weeks to reassess. Electronically Signed   By: Lavonia Dana M.D.   On: 03/29/2017 14:52   MAU Course  Procedures LR  MDM Labs and [place] ordered and reviewed. Anemia noted. Not orthostatic. Small amt of bleeding since arrival. Presentation, clinical findings, and plan discussed with Dr. Sabra Heck >plan for Aygestin and f/u in office next week. Stable for discharge home.   Assessment and Plan   1. Uterine leiomyoma, unspecified location   2. Abnormal uterine bleeding (AUB)   3. Blood loss anemia    Discharge home Follow up with Dr. Sabra Heck 4 days Rx Ferrex Rx Aygestin  Allergies as of 03/29/2017      Reactions   Ivp Dye [iodinated Diagnostic Agents] Hives, Itching, Swelling   Bee Venom Swelling      Medication List    TAKE these medications   acetaminophen 500 MG tablet Commonly known as:  TYLENOL Take 500 mg by mouth every 6 (six) hours as needed for mild pain or headache.  acetaminophen-codeine 300-30 MG tablet Commonly known as:  TYLENOL #3 One to two po qd   atorvastatin 40 MG tablet Commonly known as:  LIPITOR Take 40 mg by mouth daily at 6 PM.   atropine 1 % ophthalmic solution Place 1 drop into the right eye 3 (three) times daily.   brimonidine 0.1 % Soln Commonly known as:  ALPHAGAN P Place 1 drop into the right eye every 8 (eight) hours.   busPIRone 15 MG tablet Commonly known as:  BUSPAR Take 1 tablet (15 mg total) by mouth 2 (two) times daily. What changed:    when to take this  reasons to take this   clonazePAM 0.5 MG tablet Commonly known as:  KLONOPIN Take 1 tablet (0.5 mg total) by mouth 2 (two)  times daily as needed. for anxiety   dorzolamide-timolol 22.3-6.8 MG/ML ophthalmic solution Commonly known as:  COSOPT Place 1 drop into the right eye 2 (two) times daily.   fluticasone 50 MCG/ACT nasal spray Commonly known as:  FLONASE Place 1-2 sprays into both nostrils daily.   iron polysaccharides 150 MG capsule Commonly known as:  NIFEREX Take 1 capsule (150 mg total) by mouth daily.   MULTI-VITAMINS Tabs Take 1 tablet by mouth daily.   norethindrone 5 MG tablet Commonly known as:  AYGESTIN Take 1 tablet (5 mg total) by mouth 2 (two) times daily. Once bleeding stops continue 1 po daily   SUMAtriptan 100 MG tablet Commonly known as:  IMITREX Take 1 tablet (100 mg total) by mouth once as needed for migraine. May repeat in 2 hours if headache persists or recurs.   verapamil 240 MG CR tablet Commonly known as:  CALAN-SR Take 1 tablet (240 mg total) by mouth at bedtime.   VIGAMOX 0.5 % ophthalmic solution Generic drug:  moxifloxacin Vigamox 0.5 % eye drops  INSTILL 1 DROP INTO AFFECTED EYE(S) BY OPHTHALMIC ROUTE 3 TIMES PER DAY   VITAMIN B-12 PO Take 1 tablet by mouth daily.      Julianne Handler, CNM 03/29/2017, 3:37 PM

## 2017-03-29 NOTE — Progress Notes (Addendum)
Presents to triage for heavy bleeding past 3 days but has had off on bleeding for 2 months. Hgb 7 so pt here for labs nad u/s.   1207: provider at bs assessing. Pelvic and speculum exam done. Fox swab used to expel some blood.   1222: Labs and IV done. LR bolus per order.   1255: Pt to U/S  1333: back from U/S  1400: IV saline locked.

## 2017-04-02 ENCOUNTER — Ambulatory Visit (INDEPENDENT_AMBULATORY_CARE_PROVIDER_SITE_OTHER): Payer: PRIVATE HEALTH INSURANCE | Admitting: Obstetrics & Gynecology

## 2017-04-02 ENCOUNTER — Encounter: Payer: Self-pay | Admitting: Obstetrics & Gynecology

## 2017-04-02 ENCOUNTER — Telehealth: Payer: Self-pay | Admitting: Obstetrics & Gynecology

## 2017-04-02 VITALS — BP 102/60 | HR 76 | Resp 16

## 2017-04-02 DIAGNOSIS — Z862 Personal history of diseases of the blood and blood-forming organs and certain disorders involving the immune mechanism: Secondary | ICD-10-CM

## 2017-04-02 DIAGNOSIS — N852 Hypertrophy of uterus: Secondary | ICD-10-CM | POA: Diagnosis not present

## 2017-04-02 DIAGNOSIS — D649 Anemia, unspecified: Secondary | ICD-10-CM

## 2017-04-02 NOTE — Telephone Encounter (Signed)
Spoke with patient. Patient requesting refill of Tylenol #3 and Klonopin until OV with Dr. Felecia Shelling, Neurologist. Patient states she was in for OV today with Dr. Sabra Heck.   Recommended patient f/u with prescribing provider, Dr. Felecia Shelling for refills. Patient states she has an appointment on 04/20/17, can not be refilled until after OV.   Recommended patient return call for earlier appt if available, offered assistance, patient declined.   Advised Dr. Sabra Heck will review, I will return call with any additional recommendations. Patient ended call.   Routing to provider for final review. Patient is agreeable to disposition. Will close encounter.

## 2017-04-02 NOTE — Telephone Encounter (Signed)
Patient was seen today and has another question for Dr.Miller. No details given.

## 2017-04-02 NOTE — Progress Notes (Signed)
GYNECOLOGY  VISIT  CC:   Uterine enlargement, anemia, menorrhagia  HPI: 42 y.o. [id number] Single Native American female here for follow after being sent to the MAU last Friday due to heavy vaginal bleeding.  Patient was initially seen in the office by Melvia Heaps, CNM.  She was having very heavy vaginal bleeding and we felt it may you assessment would be more efficient for the patient.  Hemoglobin was 8.2 and her reticulocyte count was elevated.  Blood type is A+.  Ultrasound was performed showing a uterus measuring 20 x 12.6 x 11.3 cm.  Multiple uterine fibroids were noted with the largest measuring 10 x 11 x 12 cm the endometrium.  Complex due to distortion of the uterus by the fibroids and seemed to measure 5 mm.  Treatment with norethindrone was started.  Bleeding has been minimal over the weekend.  Patient reports she is feeling much better.  She is taking iron.  She does have history of uterine artery embolization but is obviously failed that.  Treatment options for her fibroids were discussed with the patient.  I think at this point with the uterus so large are only 2 options are either a uterine artery embolization or treatment with Depo-Lupron to shrink the fibroids and, hopefully improve her hemoglobin.  Once uterus was smaller than laparoscopic hysterectomy be attempted.  As her hemoglobin is 8.2, I do feel Depo-Lupron is a better option if definitive surgery is ultimately her final desire.  Depo-Lupron and side effects reviewed.  Menopausal symptoms, bone loss, mood changes, initial bleeding with hopefully subsequent amenorrhea discussed.  Patient is coming by her mother.  After discussion, they decided to proceed with Depo-Lupron.  GYNECOLOGIC HISTORY: Patient's last menstrual period was 03/27/2017 (exact date). Contraception: BTL Menopausal hormone therapy: none  Patient Active Problem List   Diagnosis Date Noted  . Abnormal MRI of head 01/21/2016  . Anemia 01/12/2016  .  Postpartum depression 01/12/2016  . Vascular headache 09/15/2015  . Insomnia 09/15/2015  . Headache 07/08/2015  . Headache, chronic migraine without aura, intractable 07/08/2015  . Anxiety state 07/08/2015  . Intractable headache 06/24/2015  . Neck pain 06/24/2015  . Convulsions ([place]) 06/24/2015  . BV (bacterial vaginosis)   . Fibroids   . Trichomonas   . Menorrhagia   . Dysmenorrhea   . Yeast infection   . Central retinal vein occlusion 12/11/2011  . Cotton wool exudates 12/11/2011  . Cystoid macular edema 12/11/2011    Past Medical History:  Diagnosis Date  . Abnormal Pap smear of cervix   . Anxiety   . Blind right eye    swelling & pressure  . BV (bacterial vaginosis)   . Cluster headache   . Depression   . Dysmenorrhea   . Eye disorder   . Fibroid   . Fibroids   . Menorrhagia   . Migraines   . Pneumonia   . Seasonal allergies   . Seizure ([place])   . Trichomonas   . Yeast infection     Past Surgical History:  Procedure Laterality Date  . ABLATION    . COLPOSCOPY    . EYE SURGERY    . HYSTEROSCOPY W/D&C    . TUBAL LIGATION    . WISDOM TOOTH EXTRACTION      MEDS:   Current Outpatient Medications on File Prior to Visit  Medication Sig Dispense Refill  . acetaminophen (TYLENOL) 500 MG tablet Take 500 mg by mouth every 6 (six) hours as needed for mild pain  or headache.    [name] atorvastatin (LIPITOR) 40 MG tablet Take 40 mg by mouth daily at 6 PM.     . atropine 1 % ophthalmic solution Place 1 drop into the right eye 3 (three) times daily.     . brimonidine (ALPHAGAN P) 0.1 % SOLN Place 1 drop into the right eye every 8 (eight) hours.     . busPIRone (BUSPAR) 15 MG tablet Take 1 tablet (15 mg total) by mouth 2 (two) times daily. (Patient taking differently: Take 15 mg by mouth 2 (two) times daily as needed. ) 180 tablet 3  . Cyanocobalamin (VITAMIN B-12 PO) Take 1 tablet by mouth daily.     . dorzolamide-timolol (COSOPT) 22.3-6.8 MG/ML ophthalmic solution Place 1  drop into the right eye 2 (two) times daily.     . fluticasone (FLONASE) 50 MCG/ACT nasal spray Place 1-2 sprays into both nostrils daily.    . iron polysaccharides (NIFEREX) 150 MG capsule Take 1 capsule (150 mg total) by mouth daily. 60 capsule 0  . moxifloxacin (VIGAMOX) 0.5 % ophthalmic solution Vigamox 0.5 % eye drops  INSTILL 1 DROP INTO AFFECTED EYE(S) BY OPHTHALMIC ROUTE 3 TIMES PER DAY    . Multiple Vitamin (MULTI-VITAMINS) TABS Take 1 tablet by mouth daily.     . norethindrone (AYGESTIN) 5 MG tablet Take 1 tablet (5 mg total) by mouth 2 (two) times daily. Once bleeding stops continue 1 po daily 20 tablet 0  . SUMAtriptan (IMITREX) 100 MG tablet Take 1 tablet (100 mg total) by mouth once as needed for migraine. May repeat in 2 hours if headache persists or recurs. 10 tablet 2  . verapamil (CALAN-SR) 240 MG CR tablet Take 1 tablet (240 mg total) by mouth at bedtime. 90 tablet 0  . acetaminophen-codeine (TYLENOL #3) 300-30 MG tablet One to two po qd (Patient not taking: Reported on 04/02/2017) 60 tablet 3  . clonazePAM (KLONOPIN) 0.5 MG tablet Take 1 tablet (0.5 mg total) by mouth 2 (two) times daily as needed. for anxiety (Patient not taking: Reported on 04/02/2017) 180 tablet 1   Current Facility-Administered Medications on File Prior to Visit  Medication Dose Route Frequency Provider Last Rate Last Dose  . gadopentetate dimeglumine (MAGNEVIST) injection 16 mL  16 mL Intravenous Once PRN Sater, Nanine Means, MD        ALLERGIES: Ivp dye [iodinated diagnostic agents] and Bee venom  Family History  Problem Relation Age of Onset  . Alcoholism Father   . Cirrhosis Father   . Cancer Maternal Grandmother        unsure of type    SH: Single, occasional smoker  Review of Systems  All other systems reviewed and are negative.   PHYSICAL EXAMINATION:    BP 102/60 (BP Location: Right Arm, Patient Position: Sitting, Cuff Size: Large)   Pulse 76   Resp 16   LMP 03/27/2017 (Exact Date)      General appearance: alert, cooperative and appears stated age Neck: no adenopathy, supple, symmetrical, trachea midline and thyroid normal to inspection and palpation CV:  Regular rate and rhythm Lungs:  clear to auscultation, no wheezes, rales or rhonchi, symmetric air entry Abdomen: soft, non-tender; bowel sounds normal; pelvic mass noted at umbilicus  Pelvic: External genitalia:  no lesions              Urethra:  normal appearing urethra with no masses, tenderness or lesions  Bartholins and Skenes: normal                 Vagina: normal appearing vagina with normal color and discharge, no lesions              Cervix: no lesions              Bimanual Exam:  Uterus:  enlarged to umbilicus with nodularity c/w fibroids, mobile              Adnexa: no mass, fullness, tenderness   Chaperone was present for exam.  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 Twice daily iron recommended Will continue with Aygestin use 5 mg twice daily.  Patient can decrease to daily and bleeding is completely stopped.  She should continue on 5 mg daily until we are ready to proceed with the Depo-Lupron.  All questions answered. We will need input from both her neurologist, ophthalmologist and hematologist prior to scheduling any surgery.    ~30 minutes spent with patient >50% of time was in face to face discussion of above.

## 2017-04-03 LAB — CYTOLOGY - PAP
DIAGNOSIS: NEGATIVE
HPV: NOT DETECTED

## 2017-04-09 ENCOUNTER — Telehealth: Payer: Self-pay | Admitting: Obstetrics & Gynecology

## 2017-04-09 NOTE — Telephone Encounter (Signed)
Left message to call Breydan Shillingburg at [phone].  

## 2017-04-09 NOTE — Telephone Encounter (Signed)
Patient called requesting to follow up with the nurse about a "shot" she may need to control her menstrual cycle.  Last seen: 04/02/17

## 2017-04-10 NOTE — Telephone Encounter (Signed)
PA completed for Lupron and faxed and confirmed to [place] at [phone]

## 2017-04-10 NOTE — Telephone Encounter (Signed)
Spoke with patient. Requesting update for Lupron and refill of Aygestin. Reporys taking Aygestin 5 mg bid, describes flow as spotting.   Advised patient will f/u with PA request for Lupron and return call with update. Will f.u with Dr. Sabra Heck regarding refill. Pharmacy updated per pt request. Patient request detailed message on home number if no answer. Patient is agreeable.   PA to Dr. Sabra Heck for signature.   Dr. Sabra Heck  -please advise on refill?

## 2017-04-12 ENCOUNTER — Other Ambulatory Visit: Payer: Self-pay | Admitting: Obstetrics & Gynecology

## 2017-04-12 MED ORDER — NORETHINDRONE ACETATE 5 MG PO TABS: 5.0000 mg | ORAL_TABLET | Freq: Two times a day (BID) | ORAL | 0 refills | Status: DC

## 2017-04-12 NOTE — Telephone Encounter (Signed)
Spoke with patient, advised order for Aygestin to [place]. [facility], advised patient I will cancel order at [facility] and [facility] new [facility].   Advised Lupron PA approved 04/11/17 -07/09/17. [facility] will [facility] to review benefits and authorize shipment to office.   Patient verbalizes understanding and is agreeable.   RX cancelled at [place], spoke with [place]. [facility].   Routing to provider for final review. Patient is agreeable to disposition. Will close encounter.   Order for Lupron faxed to [place].

## 2017-04-12 NOTE — Telephone Encounter (Signed)
Aygestin RF has been done.  Please fax Depo Lupron order so we can get this started for her.

## 2017-04-12 NOTE — Telephone Encounter (Signed)
Patient returning call to f/u with refill request of Aygestin, took last pill today. Advised will f/u with Dr. Sabra Heck and return call, patient is agreeable.    Dr. Sabra Heck -please advise on Aygestin refill?

## 2017-04-17 ENCOUNTER — Telehealth: Payer: Self-pay | Admitting: Obstetrics & Gynecology

## 2017-04-17 NOTE — Telephone Encounter (Signed)
Spoke with patient. Confirmed contraceptive, BTL. Patient will pick up Lupron from retail pharmacy and bring to office for injection. Nurse visit scheduled for 04/18/17 at 10am.   Reviewed with patient: Lupron may cause hot flashes, vaginal dryness and absent menses. May experience vaginal bleeding soon after injection, expect menses to stop completely. Patient verbalizes understanding and is agreeable.   Routing to provider for final review. Patient is agreeable to disposition. Will close encounter.

## 2017-04-17 NOTE — Telephone Encounter (Signed)
Spoke with patient, patient states she contacted [facility] directly to initiate shipment of Lupron. Patient authorized [facility] of [facility]. Patient anxious, states she is ready to start Lupron.   Advised patient Lupron is typically shipped directly to office, can likely pick up Lupron RX and bring to office for injection, will review with Dr. Sabra Heck to confirm and return call, patient is agreeable.   Reviewed with Dr. Sabra Heck, agreeable to plan. BTL for contraceptive, may schedule nurse visit for injection.

## 2017-04-18 ENCOUNTER — Ambulatory Visit (INDEPENDENT_AMBULATORY_CARE_PROVIDER_SITE_OTHER): Payer: PRIVATE HEALTH INSURANCE

## 2017-04-18 VITALS — BP 128/76 | HR 66 | Ht 60.25 in | Wt 179.0 lb

## 2017-04-18 DIAGNOSIS — N852 Hypertrophy of uterus: Secondary | ICD-10-CM

## 2017-04-18 DIAGNOSIS — D649 Anemia, unspecified: Secondary | ICD-10-CM | POA: Diagnosis not present

## 2017-04-18 MED ORDER — LEUPROLIDE ACETATE 3.75 MG IM KIT
3.7500 mg | PACK | Freq: Once | INTRAMUSCULAR | Status: AC
  Administered 2017-04-18: 3.75 mg via INTRAMUSCULAR

## 2017-04-18 NOTE — Progress Notes (Signed)
Patient here for first Depo Lupron 3.75mg  injection. LMP 03-27-17. Patient tolerated injection well and she will call office in two weeks to order next injection.  Given in RUQ, next due in 1 month.

## 2017-04-20 ENCOUNTER — Ambulatory Visit (INDEPENDENT_AMBULATORY_CARE_PROVIDER_SITE_OTHER): Payer: PRIVATE HEALTH INSURANCE | Admitting: Neurology

## 2017-04-20 ENCOUNTER — Other Ambulatory Visit: Payer: Self-pay

## 2017-04-20 ENCOUNTER — Encounter: Payer: Self-pay | Admitting: Neurology

## 2017-04-20 VITALS — BP 125/74 | HR 63 | Resp 16 | Ht >= 80 in | Wt 178.5 lb

## 2017-04-20 DIAGNOSIS — R569 Unspecified convulsions: Secondary | ICD-10-CM | POA: Diagnosis not present

## 2017-04-20 DIAGNOSIS — G43711 Chronic migraine without aura, intractable, with status migrainosus: Secondary | ICD-10-CM

## 2017-04-20 DIAGNOSIS — G441 Vascular headache, not elsewhere classified: Secondary | ICD-10-CM

## 2017-04-20 DIAGNOSIS — F411 Generalized anxiety disorder: Secondary | ICD-10-CM

## 2017-04-20 DIAGNOSIS — M542 Cervicalgia: Secondary | ICD-10-CM | POA: Diagnosis not present

## 2017-04-20 MED ORDER — ACETAMINOPHEN-CODEINE #3 300-30 MG PO TABS: ORAL_TABLET | ORAL | 3 refills | Status: DC

## 2017-04-20 MED ORDER — SUMATRIPTAN SUCCINATE 100 MG PO TABS: 100.0000 mg | ORAL_TABLET | Freq: Once | ORAL | 3 refills | Status: DC | PRN

## 2017-04-20 MED ORDER — CLONAZEPAM 0.5 MG PO TABS: 0.5000 mg | ORAL_TABLET | Freq: Two times a day (BID) | ORAL | 1 refills | Status: DC | PRN

## 2017-04-20 MED ORDER — VERAPAMIL HCL ER 240 MG PO TBCR: 240.0000 mg | EXTENDED_RELEASE_TABLET | Freq: Every day | ORAL | 3 refills | Status: DC

## 2017-04-20 NOTE — Progress Notes (Addendum)
[facility]  PATIENT: Angel Rodriguez DOB: 10-21-1975  REFERRING DOCTOR OR PCP:   SOURCE: patient, ED records, images on PACS, reports in EMR  _________________________________   HISTORICAL  CHIEF COMPLAINT:  Chief Complaint  Patient presents with  . Headache    Sts. h/a's have been less frequent, less severe.  Imitrex helps but causes fast heart rate/fim    HISTORY OF PRESENT ILLNESS:  Angel Rodriguez is a 42 year old woman with frequent headaches and h/o seizure.  Update 04/20/2017: Headaches are doing fairly well.   She is now having 3-4 HA's a month.    Verapamil has helped reduce the frequency of headaches.     When needed, Imitrex often helps.     She will sometimes try Tylenol first.   Due to low platelets, she can't take NSAIDs.     The TCPenia was felt to be due to fibroids and these are being treated.     She still gets redness in the right eye, not always associated with HA.    In the past when her headaches were more severe, she had the Sphenocath procedure and it was very beneficial.    Occipital nerve blocks or TPI's have also been beneficial in the past.    She has not had any more seizure.   She just had one in 2016   From 07/11/2016: HA:   Her headaches were doing much better but they are have been occurring more the past month or two. Additionally she continues to have conjunctival redness on the right.     They greatly improved after starting Verapamil and having occipital nerve block/splenius capitis muscle injection at the first visit and then [place] at the second, third and fourth visits.  After the second Sphenocath, the conjunctival redness got much better but then returned. After the fourth procedure, she went nearly pain-free for 6 months until milder headaches returned and activities slowly worsening. Pain improves  with Imitrex and rest much of the time.  She no longer can take NSAID    She tolerates the verapamil well but has occasional  constipation .     Studies:   MRI of the brain was essentially normal with just a single small T2/FLAIR hyperintense focus in the left frontal subcortical white matter. The MR venogram was normal. ESR and ANA were normal.    Mood:  Anxiety is much better.   Clonazepam with Buspar has helped better than the escitalopram.    Seizure:   In March 2016, she had a seizure.   She had an aura of numbness in her right hand and then passed out.   She is not sure there was any generalized tonic-clonic activity. Her children did not tell her that she had any. However, she did have urinary incontinence. She does not remember much that day.    Since then, she has not had any more seizures.  She has not needed any AED as she had only the one seizure/spell.       Insomnia:   She is sleeping better since starting clonazepam and having much less headache  Thrombocytopenia:   Her platelet count was reduced to 78,000 and she saw her doctor Irene Limbo in hematology. Repeat platelet count was 83,000. She was advised to stop anti-inflammatories.  REVIEW OF SYSTEMS: Constitutional: No fevers, chills, sweats, or change in appetite.  No longer has insomnia Eyes: as above Ear, nose and throat: No hearing loss, ear pain, nasal congestion, sore throat Cardiovascular:  No chest pain, palpitations Respiratory: No shortness of breath at rest or with exertion.   No wheezes GastrointestinaI: No nausea, vomiting, diarrhea, abdominal pain, fecal incontinence Genitourinary: No dysuria, urinary retention or frequency.  No nocturia. Musculoskeletal: No neck pain, back pain Integumentary: No rash, pruritus, skin lesions Neurological: as above Psychiatric: No depression at this time.  Notes anxiety, better on clonazepam and Buspar Endocrine: No palpitations, diaphoresis, change in appetite, change in weigh or increased thirst Hematologic/Lymphatic: No anemia, purpura, petechiae. Allergic/Immunologic: No itchy/runny eyes, nasal  congestion, recent allergic reactions, rashes  ALLERGIES: Allergies  Allergen Reactions  . Ivp Dye [Iodinated Diagnostic Agents] Hives, Itching and Swelling  . Bee Venom Swelling    HOME MEDICATIONS:  Current Outpatient Medications:  .  acetaminophen (TYLENOL) 500 MG tablet, Take 500 mg by mouth every 6 (six) hours as needed for mild pain or headache., Disp: , Rfl:  .  acetaminophen-codeine (TYLENOL #3) 300-30 MG tablet, One to two po qd, Disp: 60 tablet, Rfl: 3 .  atorvastatin (LIPITOR) 40 MG tablet, Take 40 mg by mouth daily at 6 PM. , Disp: , Rfl:  .  atropine 1 % ophthalmic solution, Place 1 drop into the right eye 3 (three) times daily. , Disp: , Rfl:  .  brimonidine (ALPHAGAN P) 0.1 % SOLN, Place 1 drop into the right eye every 8 (eight) hours. , Disp: , Rfl:  .  busPIRone (BUSPAR) 15 MG tablet, Take 1 tablet (15 mg total) by mouth 2 (two) times daily. (Patient taking differently: Take 15 mg by mouth 2 (two) times daily as needed. ), Disp: 180 tablet, Rfl: 3 .  clonazePAM (KLONOPIN) 0.5 MG tablet, Take 1 tablet (0.5 mg total) by mouth 2 (two) times daily as needed. for anxiety, Disp: 180 tablet, Rfl: 1 .  Cyanocobalamin (VITAMIN B-12 PO), Take 1 tablet by mouth daily. , Disp: , Rfl:  .  dorzolamide-timolol (COSOPT) 22.3-6.8 MG/ML ophthalmic solution, Place 1 drop into the right eye 2 (two) times daily. , Disp: , Rfl:  .  fluticasone (FLONASE) 50 MCG/ACT nasal spray, Place 1-2 sprays into both nostrils daily., Disp: , Rfl:  .  iron polysaccharides (NIFEREX) 150 MG capsule, Take 1 capsule (150 mg total) by mouth daily., Disp: 60 capsule, Rfl: 0 .  latanoprost (XALATAN) 0.005 % ophthalmic solution, Place 1 drop into both eyes at bedtime., Disp: , Rfl:  .  LUPRON DEPOT, 77-MONTH, 3.75 MG injection, , Disp: , Rfl:  .  moxifloxacin (VIGAMOX) 0.5 % ophthalmic solution, Vigamox 0.5 % eye drops  INSTILL 1 DROP INTO AFFECTED EYE(S) BY OPHTHALMIC ROUTE 3 TIMES PER DAY, Disp: , Rfl:  .  Multiple  Vitamin (MULTI-VITAMINS) TABS, Take 1 tablet by mouth daily. , Disp: , Rfl:  .  norethindrone (AYGESTIN) 5 MG tablet, Take 1 tablet (5 mg total) by mouth 2 (two) times daily. Once bleeding stops continue 1 po daily., Disp: 30 tablet, Rfl: 0 .  SUMAtriptan (IMITREX) 100 MG tablet, Take 1 tablet (100 mg total) by mouth once as needed for migraine. May repeat in 2 hours if headache persists or recurs., Disp: 18 tablet, Rfl: 3 .  verapamil (CALAN-SR) 240 MG CR tablet, Take 1 tablet (240 mg total) by mouth at bedtime., Disp: 90 tablet, Rfl: 3 No current facility-administered medications for this visit.   Facility-Administered Medications Ordered in Other Visits:  .  gadopentetate dimeglumine (MAGNEVIST) injection 16 mL, 16 mL, Intravenous, Once PRN, Sharay Bellissimo, Nanine Means, MD  PAST MEDICAL HISTORY: Past Medical History:  Diagnosis Date  . Abnormal Pap smear of cervix   . Anxiety   . Blind right eye    swelling & pressure  . BV (bacterial vaginosis)   . Cluster headache   . Depression   . Dysmenorrhea   . Eye disorder   . Fibroid   . Fibroids   . Menorrhagia   . Migraines   . Pneumonia   . Seasonal allergies   . Seizure ([place])   . Trichomonas   . Yeast infection     PAST SURGICAL HISTORY: Past Surgical History:  Procedure Laterality Date  . ABLATION    . COLPOSCOPY    . EYE SURGERY    . HYSTEROSCOPY W/D&C    . TUBAL LIGATION    . WISDOM TOOTH EXTRACTION      FAMILY HISTORY: Family History  Problem Relation Age of Onset  . Alcoholism Father   . Cirrhosis Father   . Cancer Maternal Grandmother        unsure of type    SOCIAL HISTORY:  Social History   Socioeconomic History  . Marital status: Single    Spouse name: Not on file  . Number of children: Not on file  . Years of education: Not on file  . Highest education level: Not on file  Social Needs  . Financial resource strain: Not on file  . Food insecurity - worry: Not on file  . Food insecurity - inability: Not on  file  . Transportation needs - medical: Not on file  . Transportation needs - non-medical: Not on file  Occupational History  . Not on file  Tobacco Use  . Smoking status: Current Some Day Smoker    Types: E-cigarettes  . Smokeless tobacco: Never Used  Substance and Sexual Activity  . Alcohol use: Yes    Comment: occasional  . Drug use: No  . Sexual activity: Yes    Birth control/protection: Surgical    Comment: btl  Other Topics Concern  . Not on file  Social History Narrative  . Not on file     PHYSICAL EXAM  Vitals:   04/20/17 1118  BP: 125/74  Pulse: 63  Resp: 16  Weight: 178 lb 8 oz (81 kg)  Height: '7\' 1"'$  (2.159 m)    Body mass index is 17.37 kg/m.   General: The patient is well-developed and well-nourished and in no acute distress  Eyes:    No conjunctival erythema.    Neck: The neck is supple.  There is tenderness over the right occiput. The left occiput is nontender. Range of motion in the neck is reasonably normal  Musculoskeletal:  Back is nontender  Neurologic Exam  Mental status: The patient is alert and oriented x 3 at the time of the examination. The patient has apparent normal recent and remote memory, with an apparently normal attention span and concentration ability.   Speech is normal.  Cranial nerves: Extraocular movements are full.   She now has just minimal right ptosis.. There is normal facial sensation to soft touch.  Facial strength is normal.  Trapezius and sternocleidomastoid strength is normal. No dysarthria is noted.  The tongue is midline, and the patient has symmetric elevation of the soft palate. No obvious hearing deficits are noted.  Motor:  Muscle bulk is normal.   Tone is normal. Strength is  5 / 5 in all 4 extremities.    Gait and station: Station is normal.   Gait is normal. Tandem gait is  normal. Romberg is negative.       DIAGNOSTIC DATA (LABS, IMAGING, TESTING) - I reviewed patient records, labs, notes, testing and  imaging myself where available.  Lab Results  Component Value Date   WBC 9.6 03/29/2017   HGB 8.2 (L) 03/29/2017   HCT 24.1 (L) 03/29/2017   MCV 90.9 03/29/2017   PLT 76 (L) 03/29/2017      Component Value Date/Time   NA 138 08/31/2016 0957   K 3.9 08/31/2016 0957   CL 111 05/30/2014 1215   CO2 22 08/31/2016 0957   GLUCOSE 106 08/31/2016 0957   BUN 8.6 08/31/2016 0957   CREATININE 0.8 08/31/2016 0957   CALCIUM 10.3 08/31/2016 0957   PROT 7.6 08/31/2016 0957   ALBUMIN 3.9 08/31/2016 0957   AST 18 08/31/2016 0957   ALT 23 08/31/2016 0957   ALKPHOS 62 08/31/2016 0957   BILITOT 0.43 08/31/2016 0957   GFRNONAA >90 05/30/2014 1215   GFRAA >90 05/30/2014 1215      ASSESSMENT AND PLAN  Vascular headache  Intractable chronic migraine without aura and with status migrainosus  Neck pain  Convulsions, unspecified convulsion type ([place])  Anxiety state   1.   Continued daily verapamil for the vascular headaches. She will also take Imitrex and/or Tylenol No. 3 for breakthrough headaches. Due to her thrombocytopenia. She is not supposed to be on NSAIDs.   2.   Continue clonazepam 0.5 mg twice a day for anxiety and insomnia.   3.   Trigger point injection of the right splenius capitis and splenius cervicis muscle with 80 mg Depo-Medrol in 3 mL [place]. She tolerated the procedure well and pain was better afterwards. If headaches worsen, she could return for another trigger point injection or for a spinal cath procedure.  4.   She will return in 6 months or sooner if there are new or worsening symptoms.  Zyrion Coey A. Felecia Shelling, MD, PhD 3/53/2992, 42:68 PM Certified in Neurology, Clinical Neurophysiology, Sleep Medicine, Pain Medicine and Neuroimaging  [facility] [address] [place], [place] [address] [phone] [phone]

## 2017-04-23 NOTE — Addendum Note (Signed)
Addended by: Polly Cobia on: 04/23/2017 11:10 AM   Modules accepted: Orders

## 2017-05-04 ENCOUNTER — Encounter: Payer: Self-pay | Admitting: Obstetrics & Gynecology

## 2017-05-04 ENCOUNTER — Ambulatory Visit (INDEPENDENT_AMBULATORY_CARE_PROVIDER_SITE_OTHER): Payer: PRIVATE HEALTH INSURANCE | Admitting: Obstetrics & Gynecology

## 2017-05-04 ENCOUNTER — Telehealth: Payer: Self-pay | Admitting: Certified Nurse Midwife

## 2017-05-04 VITALS — BP 130/80 | HR 80 | Temp 98.4°F | Resp 16 | Wt 178.0 lb

## 2017-05-04 DIAGNOSIS — N852 Hypertrophy of uterus: Secondary | ICD-10-CM | POA: Diagnosis not present

## 2017-05-04 DIAGNOSIS — N926 Irregular menstruation, unspecified: Secondary | ICD-10-CM

## 2017-05-04 DIAGNOSIS — D500 Iron deficiency anemia secondary to blood loss (chronic): Secondary | ICD-10-CM

## 2017-05-04 DIAGNOSIS — R102 Pelvic and perineal pain: Secondary | ICD-10-CM

## 2017-05-04 LAB — POCT URINALYSIS DIPSTICK
Bilirubin, UA: NEGATIVE
GLUCOSE UA: NEGATIVE
KETONES UA: NEGATIVE
Nitrite, UA: NEGATIVE
Urobilinogen, UA: 0.2 E.U./dL
pH, UA: 6 (ref 5.0–8.0)

## 2017-05-04 LAB — HEMOGLOBIN: HEMOGLOBIN: 11.2

## 2017-05-04 MED ORDER — NORETHINDRONE ACETATE 5 MG PO TABS: 5.0000 mg | ORAL_TABLET | Freq: Two times a day (BID) | ORAL | 4 refills | Status: DC

## 2017-05-04 MED ORDER — TRAMADOL HCL 50 MG PO TABS: 50.0000 mg | ORAL_TABLET | Freq: Four times a day (QID) | ORAL | 0 refills | Status: DC | PRN

## 2017-05-04 NOTE — Progress Notes (Signed)
GYNECOLOGY  VISIT  CC:   Pelvic pain and bleeding  HPI: 42 y.o. [id number] Single [place] female here for vaginal bleeding.  Pt has a significantly enlarged uterus and has hx of menorrhagia with iron deficiency anemia.  She is currently on Depo Lupron to try and improve bleeding, anemia, and decrease size of uterus for laparoscopic approach for hysterectomy.  Pt reports she stopped taking her aygestin this week and started bleeding.  Now she is having significant cramping today.  Started back the aygestin yesterday.  Does have some hot flashes with the Lupron.  Denies SOB or palpitations.    Is also having some bladder pressure but doesn't think this is cystitis.  Denies dysuria.  Urgency and frequency are unchanged.  GYNECOLOGIC HISTORY: Patient's last menstrual period was 05/02/2017. Contraception: Tubal Ligation  Menopausal hormone therapy: none  Patient Active Problem List   Diagnosis Date Noted  . Abnormal MRI of head 01/21/2016  . Anemia 01/12/2016  . Postpartum depression 01/12/2016  . Vascular headache 09/15/2015  . Insomnia 09/15/2015  . Headache 07/08/2015  . Headache, chronic migraine without aura, intractable 07/08/2015  . Anxiety state 07/08/2015  . Neck pain 06/24/2015  . Convulsions ([place]) 06/24/2015  . Fibroids   . Menorrhagia   . Dysmenorrhea   . Central retinal vein occlusion 12/11/2011  . Cotton wool exudates 12/11/2011  . Cystoid macular edema 12/11/2011    Past Medical History:  Diagnosis Date  . Abnormal Pap smear of cervix   . Anxiety   . Blind right eye    swelling & pressure  . BV (bacterial vaginosis)   . Cluster headache   . Depression   . Dysmenorrhea   . Eye disorder   . Fibroids   . History of pneumonia   . History of trichomoniasis   . Menorrhagia   . Migraines   . Seizure [facility])     Past Surgical History:  Procedure Laterality Date  . ABLATION    . COLPOSCOPY    . EYE SURGERY    . HYSTEROSCOPY W/D&C    . TUBAL LIGATION    .  WISDOM TOOTH EXTRACTION      MEDS:   Current Outpatient Medications on File Prior to Visit  Medication Sig Dispense Refill  . acetaminophen (TYLENOL) 500 MG tablet Take 500 mg by mouth every 6 (six) hours as needed for mild pain or headache.    [name] acetaminophen-codeine (TYLENOL #3) 300-30 MG tablet One to two po qd 60 tablet 3  . atorvastatin (LIPITOR) 40 MG tablet Take 40 mg by mouth daily at 6 PM.     . atropine 1 % ophthalmic solution Place 1 drop into the right eye 3 (three) times daily.     . brimonidine (ALPHAGAN P) 0.1 % SOLN Place 1 drop into the right eye every 8 (eight) hours.     . busPIRone (BUSPAR) 15 MG tablet Take 1 tablet (15 mg total) by mouth 2 (two) times daily. (Patient taking differently: Take 15 mg by mouth 2 (two) times daily as needed. ) 180 tablet 3  . clonazePAM (KLONOPIN) 0.5 MG tablet Take 1 tablet (0.5 mg total) by mouth 2 (two) times daily as needed. for anxiety 180 tablet 1  . Cyanocobalamin (VITAMIN B-12 PO) Take 1 tablet by mouth daily.     . dorzolamide-timolol (COSOPT) 22.3-6.8 MG/ML ophthalmic solution Place 1 drop into the right eye 2 (two) times daily.     . fluticasone (FLONASE) 50 MCG/ACT nasal spray Place  1-2 sprays into both nostrils daily.    . iron polysaccharides (NIFEREX) 150 MG capsule Take 1 capsule (150 mg total) by mouth daily. 60 capsule 0  . latanoprost (XALATAN) 0.005 % ophthalmic solution Place 1 drop into both eyes at bedtime.    [name] LUPRON DEPOT, 69-MONTH, 3.75 MG injection     . moxifloxacin (VIGAMOX) 0.5 % ophthalmic solution Vigamox 0.5 % eye drops  INSTILL 1 DROP INTO AFFECTED EYE(S) BY OPHTHALMIC ROUTE 3 TIMES PER DAY    . Multiple Vitamin (MULTI-VITAMINS) TABS Take 1 tablet by mouth daily.     . SUMAtriptan (IMITREX) 100 MG tablet Take 1 tablet (100 mg total) by mouth once as needed for migraine. May repeat in 2 hours if headache persists or recurs. 18 tablet 3  . verapamil (CALAN-SR) 240 MG CR tablet Take 1 tablet (240 mg total) by  mouth at bedtime. 90 tablet 3   Current Facility-Administered Medications on File Prior to Visit  Medication Dose Route Frequency Provider Last Rate Last Dose  . gadopentetate dimeglumine (MAGNEVIST) injection 16 mL  16 mL Intravenous Once PRN Sater, Nanine Means, MD        ALLERGIES: Ivp dye [iodinated diagnostic agents] and Bee venom  Family History  Problem Relation Age of Onset  . Alcoholism Father   . Cirrhosis Father   . Cancer Maternal Grandmother        unsure of type    SH:  Occasional smoker  Review of Systems  Gastrointestinal: Positive for abdominal pain and constipation.  Genitourinary: Positive for frequency.       Painful periods Unscheduled bleeding Loss of sexual interest   All other systems reviewed and are negative.   PHYSICAL EXAMINATION:    BP 130/80 (BP Location: Right Arm, Patient Position: Sitting, Cuff Size: Large)   Pulse 80   Temp 98.4 F (36.9 C) (Oral)   Resp 16   Wt 178 lb (80.7 kg)   LMP 05/02/2017   BMI 17.32 kg/m     General appearance: alert, cooperative and appears stated age CV:  Regular rate and rhythm Lungs:  clear to auscultation, no wheezes, rales or rhonchi, symmetric air entry Abdomen: soft, non-tender; bowel sounds normal; no masses,  no organomegaly, significantly enlarged uterus noted  Pelvic: External genitalia:  no lesions              Urethra:  normal appearing urethra with no masses, tenderness or lesions              Bartholins and Skenes: normal                 Vagina: normal appearing vagina with normal color and discharge, no lesions              Cervix: no lesions, bleeding present today with small clots present              Bimanual Exam:  Uterus:  Firm and up to umbilicus (about 20 weeks size) does move in pelvic but very uncomfortable with pt to manipulate uterus)              Adnexa: no mass, fullness, tenderness              Rectovaginal: No.  Chaperone was present for exam.  Assessment: Grossly enlarged  fibroid uterus Bleeding today, started after stopping aygestin H/o anemia H/o failed endometrial ablation  Plan: Continue Depo Lupron.  Next injection should be 3/11.  Would like to, at least,  use three dosages before proceeding with surgery.  Advised pt I would really like her hemoglobin to be normal but is improved today to 11.. Trial of tramadol 50mg  1-2 tabs ever 6 hours as needed.  #40/0RF given. Urine culture pending, just to be sure   ~25 minutes spent with patient >50% of time was in face to face discussion of above.  Reviewed plan of care, importance of aygestin, importance of regularly timed Depo Lupron and plans for surgery.

## 2017-05-04 NOTE — Telephone Encounter (Signed)
Spoke with patient. Reports lower back pain, pressure, frequency and voiding small amounts. 10/10 on pain scale, not relieved with Tylenol PRN.   Received Lupron on 04/18/17. Patient states she was unsure if she was to stop aygestin after Lupron, so she stopped Aygestin for 3-4 days last wk then restarted, has experienced bleeding for the past 2 days. Changes pad x2 daily.   Denies fever/chills, N/V.  Recommended OV for further evaluation with Dr. Sabra Heck, OV scheduled for today at 12:45pm.   Routing to provider for final review. Patient is agreeable to disposition. Will close encounter.

## 2017-05-04 NOTE — Telephone Encounter (Signed)
Patient is having abdominal pain 

## 2017-05-05 ENCOUNTER — Encounter: Payer: Self-pay | Admitting: Obstetrics & Gynecology

## 2017-05-06 LAB — URINE CULTURE

## 2017-05-07 ENCOUNTER — Telehealth: Payer: Self-pay | Admitting: *Deleted

## 2017-05-07 NOTE — Telephone Encounter (Signed)
-----   Message from Polly Cobia, [place] sent at 05/07/2017  9:52 AM EST ----- Routing to [employer]

## 2017-05-07 NOTE — Telephone Encounter (Signed)
Routing to Dr. Lestine Box, will close encounter.   Cc: Angel Rodriguez

## 2017-05-07 NOTE — Telephone Encounter (Signed)
Notes recorded by Burnice Logan, RN on 05/07/2017 at 10:00 AM EST Spoke with patient, advised of results as seen below. Patient previously scheduled for next Depo Lupron on 05/14/17 at 9am. Patient is aware she has #5 refills on Lupron, she will contact [facility] directly to authorize shipment of medication to [place]. Patient will pick up Dep[facility] Lupron from [facility] and bring to office day of injection. RN advised patient to return call to office with any concerns/questions. Patient verbalizes understanding and is agreeable. See telephone encounter created to review with provider.    ------  Notes recorded by Polly Cobia, CMA on 05/07/2017 at 9:52 AM EST Routing to [place] ------  Notes recorded by Megan Salon, MD on 05/06/2017 at 10:35 PM EST Please let pt know her urine culture was negative. Can you please check with Gay Filler about her next Depo Lupron dosage and do we need to do anything to get this shipped? Thanks.

## 2017-05-14 ENCOUNTER — Ambulatory Visit (INDEPENDENT_AMBULATORY_CARE_PROVIDER_SITE_OTHER): Payer: PRIVATE HEALTH INSURANCE

## 2017-05-14 ENCOUNTER — Encounter: Payer: Self-pay | Admitting: Obstetrics & Gynecology

## 2017-05-14 VITALS — BP 122/70 | HR 70 | Wt 180.0 lb

## 2017-05-14 DIAGNOSIS — D649 Anemia, unspecified: Secondary | ICD-10-CM

## 2017-05-14 MED ORDER — LEUPROLIDE ACETATE 3.75 MG IM KIT
3.7500 mg | PACK | Freq: Once | INTRAMUSCULAR | Status: AC
Start: 2017-05-14 — End: 2017-05-14
  Administered 2017-05-14: 3.75 mg via INTRAMUSCULAR

## 2017-05-14 NOTE — Progress Notes (Signed)
Patient here today for 2nd Depo Lupron injection 3.75mg  given LUQ. Patient tolerated injection well. She knows to call in 2 weeks to schedule next injection.

## 2017-05-30 ENCOUNTER — Telehealth: Payer: Self-pay | Admitting: Certified Nurse Midwife

## 2017-05-30 DIAGNOSIS — D219 Benign neoplasm of connective and other soft tissue, unspecified: Secondary | ICD-10-CM

## 2017-05-30 NOTE — Telephone Encounter (Signed)
Spoke with patient. Patient request to schedule 3rd lupron injection. Nurse visit scheduled for 06/13/17 at 8:30am. Advised patient to contact specialty pharmacy for refill of Lupron. Patient will have medication delivered to retial pharmacy, will pick up and bring to office.   Patient reports bleeding has reduced to spotting, no heavy bleeding, asking if hemoglobin will be rechecked at next injection? How long before rechecking size of uterus? Surgery? Advised will review plan with Dr. Sabra Heck and return call. Advised Dr. Sabra Heck is out of the office, will return 3/28, response may not be immediate. Patient verbalizes understanding.    Dr. Sabra Heck, please advise on lab work and next PUS?

## 2017-05-30 NOTE — Telephone Encounter (Signed)
Left message to call Sharee Pimple at [phone].  2nd Lupron given 05/14/17, will proceed with next Lupron injection due 06/13/17,

## 2017-05-30 NOTE — Telephone Encounter (Signed)
Patient calling to find out if she should be following up after getting the lupron injection 2 weeks ago.

## 2017-05-31 ENCOUNTER — Telehealth: Payer: Self-pay | Admitting: Obstetrics & Gynecology

## 2017-05-31 NOTE — Telephone Encounter (Signed)
Patient called requesting to speak directly with Dr. Sabra Heck about surgery.

## 2017-05-31 NOTE — Telephone Encounter (Signed)
Will forward this to Gay Filler to contact pt.  Typically reassess towards the end of three months and then if decreasing in size, will complete six months worth of therapy.

## 2017-05-31 NOTE — Telephone Encounter (Signed)
See previous open encounter dated 05/30/17. Encounter closed.

## 2017-05-31 NOTE — Telephone Encounter (Signed)
Angel Rodriguez     05/31/17 8:48 AM  Note    Patient called requesting to speak directly with Dr. Sabra Heck about surgery.

## 2017-06-13 ENCOUNTER — Ambulatory Visit (INDEPENDENT_AMBULATORY_CARE_PROVIDER_SITE_OTHER): Payer: PRIVATE HEALTH INSURANCE

## 2017-06-13 ENCOUNTER — Other Ambulatory Visit: Payer: Self-pay

## 2017-06-13 VITALS — BP 120/72 | HR 70 | Ht 60.25 in | Wt 179.8 lb

## 2017-06-13 DIAGNOSIS — D649 Anemia, unspecified: Secondary | ICD-10-CM

## 2017-06-13 DIAGNOSIS — N852 Hypertrophy of uterus: Secondary | ICD-10-CM | POA: Diagnosis not present

## 2017-06-13 LAB — HEMOGLOBIN: HEMOGLOBIN: 13.4

## 2017-06-13 MED ORDER — LEUPROLIDE ACETATE 3.75 MG IM KIT
3.7500 mg | PACK | Freq: Once | INTRAMUSCULAR | Status: AC
Start: 2017-06-13 — End: 2017-06-13
  Administered 2017-06-13: 3.75 mg via INTRAMUSCULAR

## 2017-06-13 NOTE — Telephone Encounter (Signed)
Patient in office for Lupron injection.  Very anxious to proceed with plan for surgery. Teary and states she simply cant take the stress of waiting. She doesn't care what kind of incision she has, she just wants to proceed with plan for surgery.  Discussed that recommendation is to allow the injection time to work and recheck uterine size in 3 weeks (towards the end of the 12 weeks) and then Dr Sabra Heck will discuss surgery plan. Patient requests to proceed with ultrasound ASAP and is aware she is not receiving maximum benefit from Lupron injection by proceeding.  Appointment scheduled for 06-21-17.     Routing to Dr Sabra Heck for final review. Encounter closed.

## 2017-06-13 NOTE — Addendum Note (Signed)
Addended by: Charmayne Sheer on: 06/13/2017 10:15 AM   Modules accepted: Orders

## 2017-06-13 NOTE — Progress Notes (Signed)
42 y.o. Single [place] female here for her third Depo Lupron injection.  Indication for injection: fibroid uterus and anemia  LMP:  2-27--19  Contraception:  Sterilization by Laparoscopy  Lupron order:  3.75mg  IM x 3dosages.    Order to administer given by Dr. Sabra Heck on 04-02-17.  Depo Lupron 3.27mg  given RUOQ and patient tolerated injection well. She was sent to clinical supervisor, Lamont Snowball, RN to schedule next appointment.

## 2017-06-20 ENCOUNTER — Telehealth: Payer: Self-pay | Admitting: Obstetrics & Gynecology

## 2017-06-20 DIAGNOSIS — D219 Benign neoplasm of connective and other soft tissue, unspecified: Secondary | ICD-10-CM

## 2017-06-20 NOTE — Telephone Encounter (Signed)
Spoke with patient. Advised of appointment date and time for PUS and US pelvic complete at [facility]. Patient is agreeable to date and time. Reports she has talked to hospital scheduling and is aware of instructions. Appointment scheduled to discuss results on 07/05/2017 at 4 pm with Dr.Miller. Patient is agreeable to date and time. Declines earlier appointment due to insurance changing May 1. Aware she will be contacted earlier if PUS shows anything significant. Patient verbalizes understanding.  Routing to provider for final review. Patient agreeable to disposition. Will close encounter.

## 2017-06-20 NOTE — Telephone Encounter (Signed)
Spoke with Peggy at central scheduling at the hospital. First available appointment scheduled for PUS with complete pelvis US at [facility] on 06/22/2017 at 9:30 am. Patient will need to arrive at 9:15 am with a full bladder.

## 2017-06-20 NOTE — Telephone Encounter (Signed)
Spoke with patient in regards to scheduled ultrasound in our office. Patient request to have ultrasound scheduled at the hospital, due to benefits. Patient understands she will need to consult with Dr Sabra Heck. Routing to Triage to schedule with the hospital.  Routing to Triage  cc: Lamont Snowball, RN

## 2017-06-20 NOTE — Telephone Encounter (Signed)
Left message to call Kaitlyn at [phone]. 

## 2017-06-21 ENCOUNTER — Other Ambulatory Visit: Payer: PRIVATE HEALTH INSURANCE

## 2017-06-21 ENCOUNTER — Other Ambulatory Visit: Payer: PRIVATE HEALTH INSURANCE | Admitting: Obstetrics & Gynecology

## 2017-06-22 ENCOUNTER — Ambulatory Visit (HOSPITAL_COMMUNITY)
Admission: RE | Admit: 2017-06-22 | Discharge: 2017-06-22 | Disposition: A | Discharge status: Home / Self Care | Payer: PRIVATE HEALTH INSURANCE | Source: Ambulatory Visit | Attending: Obstetrics & Gynecology | Admitting: Obstetrics & Gynecology

## 2017-06-22 DIAGNOSIS — D259 Leiomyoma of uterus, unspecified: Secondary | ICD-10-CM | POA: Diagnosis not present

## 2017-06-22 DIAGNOSIS — N939 Abnormal uterine and vaginal bleeding, unspecified: Secondary | ICD-10-CM | POA: Diagnosis present

## 2017-06-22 DIAGNOSIS — D219 Benign neoplasm of connective and other soft tissue, unspecified: Secondary | ICD-10-CM

## 2017-06-22 DIAGNOSIS — Z9851 Tubal ligation status: Secondary | ICD-10-CM | POA: Insufficient documentation

## 2017-06-22 IMAGING — US US PELVIS COMPLETE TRANSABD/TRANSVAG
1 series · 15 of 25 positions shown · non-contrast
Comparison: [DATE]

CLINICAL DATA: Heavy bleeding for 6 months. History of bilateral
tubal ligation and fibroids. Patient receiving Depo Lupron
injections.

EXAM:
TRANSABDOMINAL AND TRANSVAGINAL ULTRASOUND OF PELVIS
TECHNIQUE: Both transabdominal and transvaginal ultrasound examinations of the
pelvis were performed. Transabdominal technique was performed for
global imaging of the pelvis including uterus, ovaries, adnexal
regions, and pelvic cul-de-sac. It was necessary to proceed with
endovaginal exam following the transabdominal exam to visualize the
adnexal regions.

[Series 1: us pelvis complete transabd/transvag · 15 of 55 slices shown]
[im 1/55]
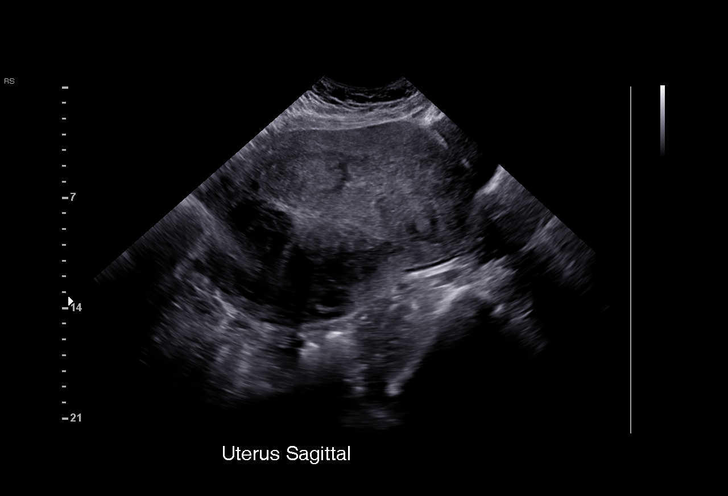
[im 5/55]
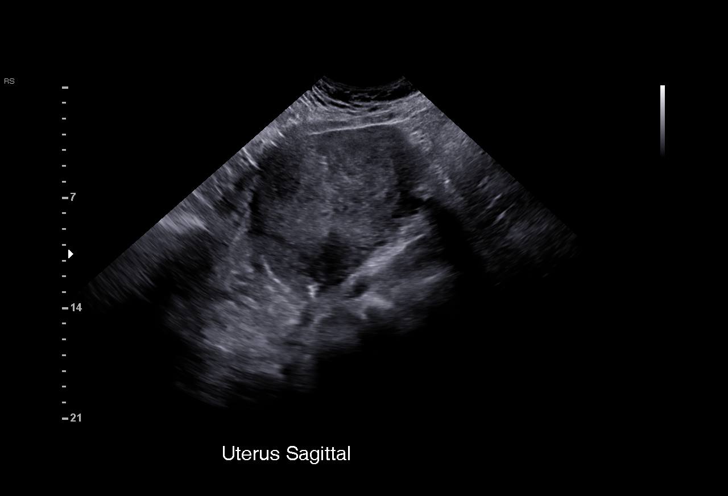
[im 10/55]
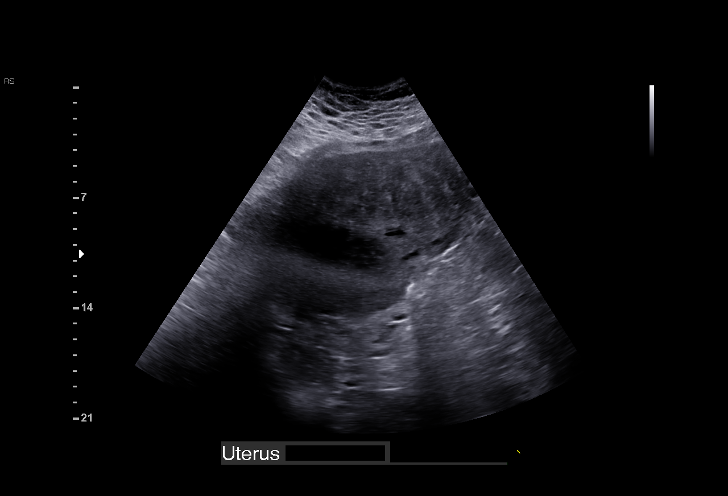
[im 12/55]
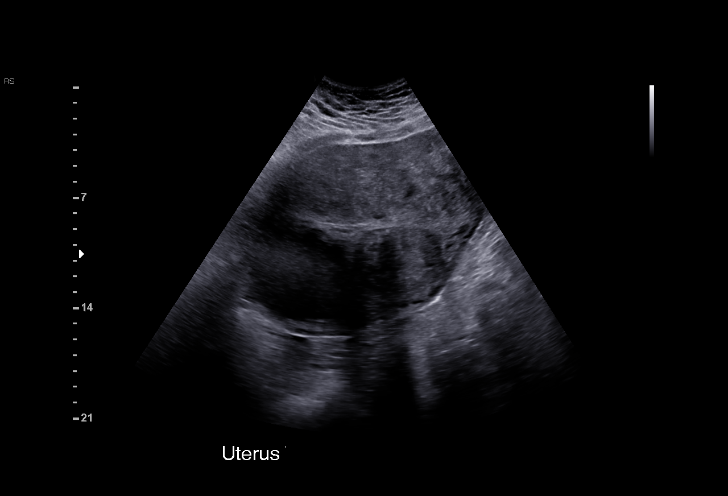
[im 16/55]
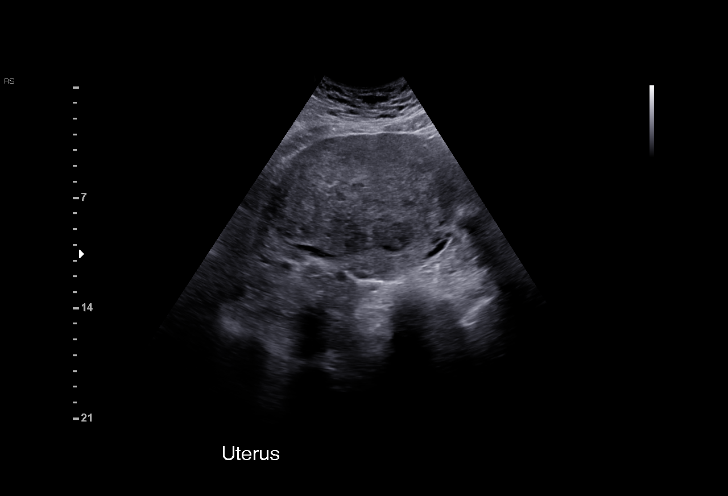
[im 21/55]
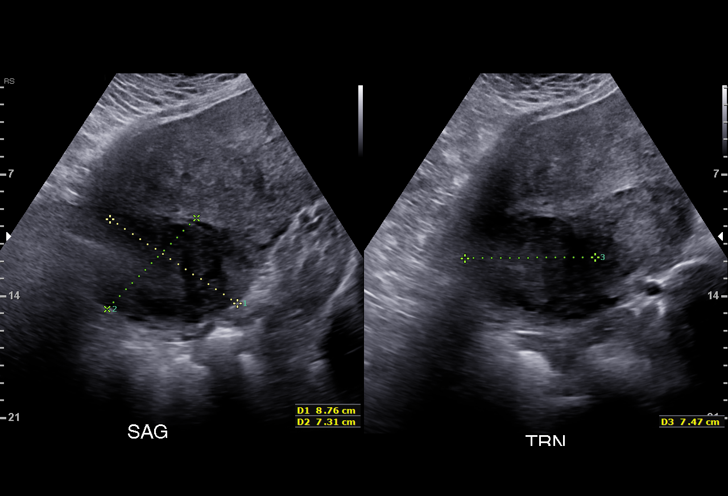
[im 23/55]
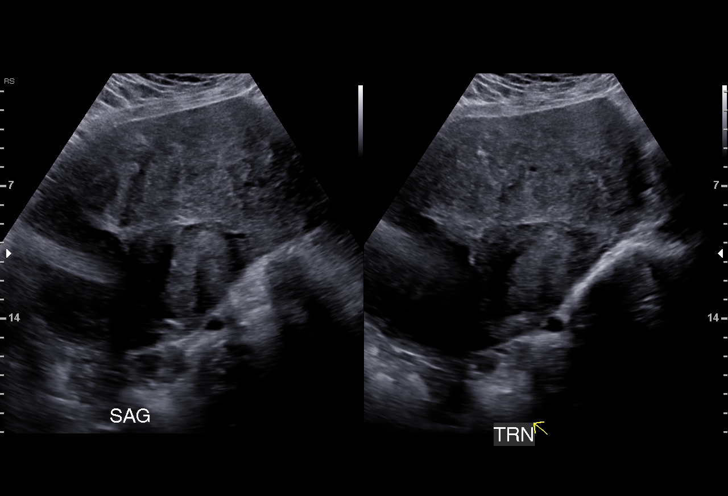
[im 28/55]
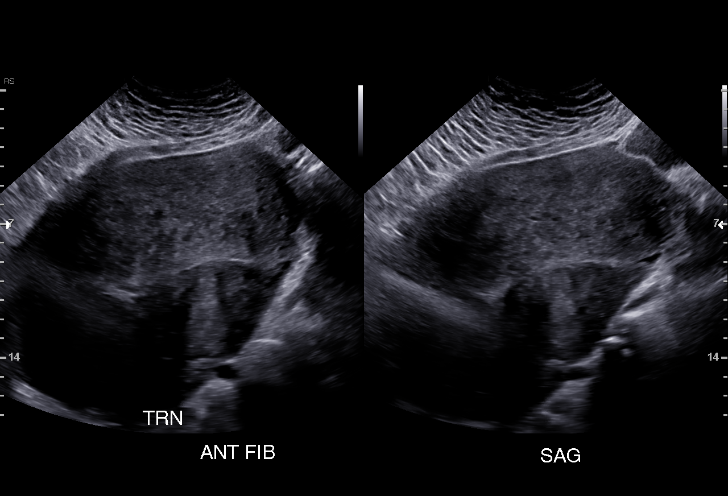
[im 32/55]
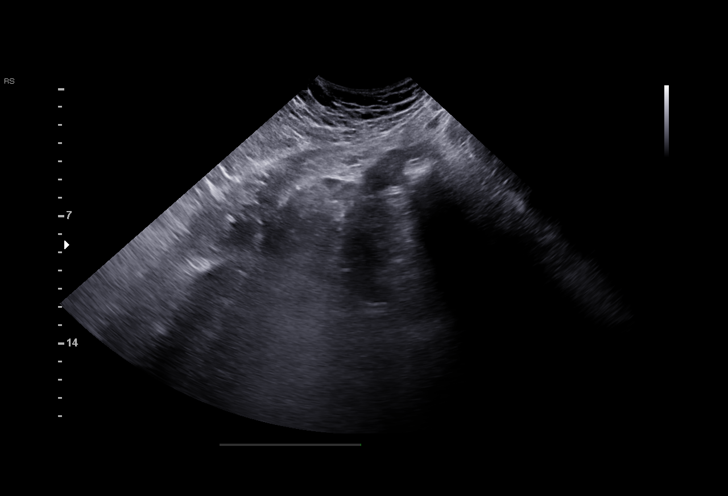
[im 34/55]
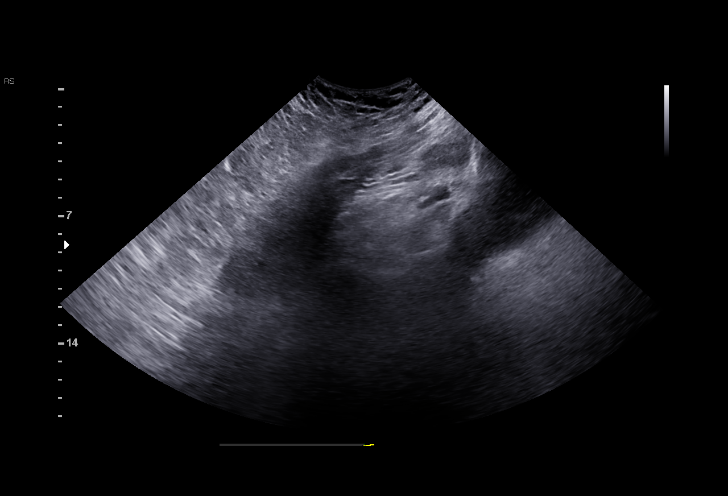
[im 39/55]
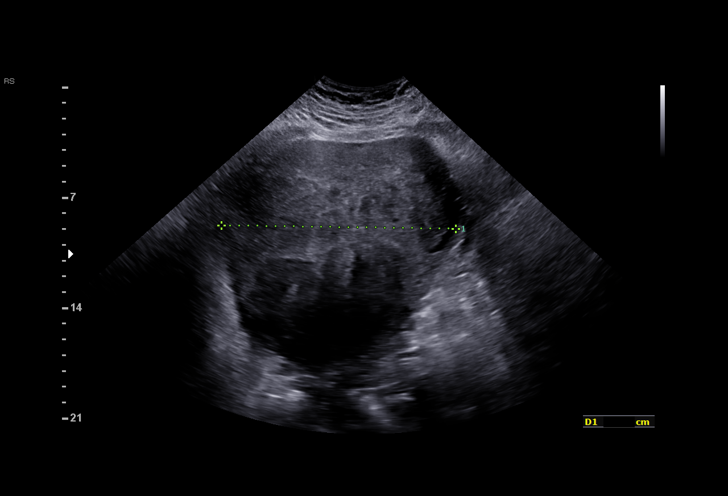
[im 43/55]
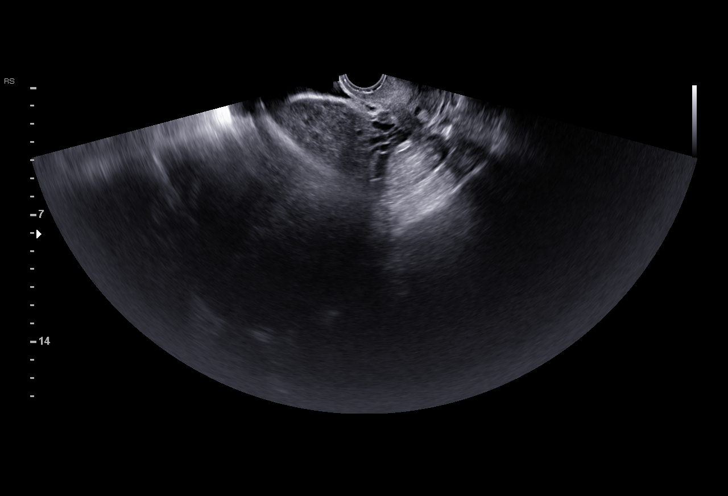
[im 46/55]
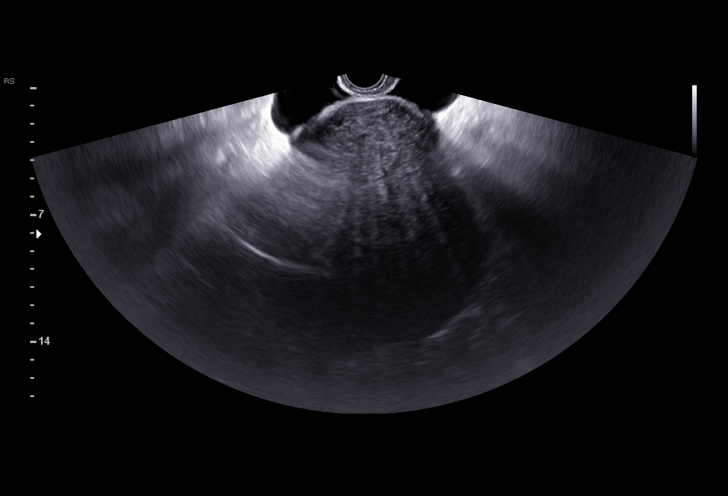
[im 50/55]
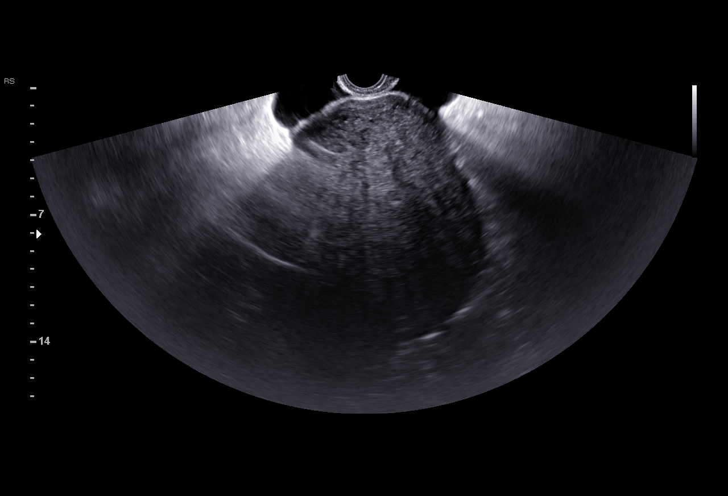
[im 55/55]
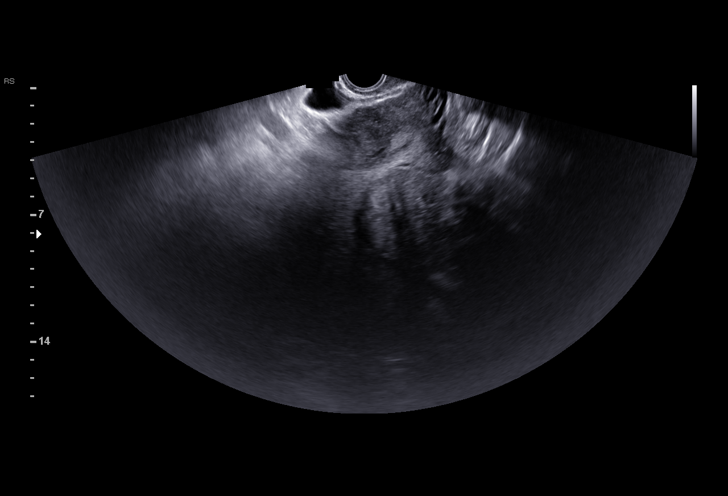

[15 of 25 positions shown; findings below may reference images not displayed]

FINDINGS: Uterus

Measurements: Uterus is enlarged and difficult to measure but is
estimated to be at least 21.6 x 12.7 x 14.8 centimeters.

Multiple fibroids are present. Large anterior fibroid is 12.4 x
x 5.3 centimeters. RIGHT fundal fibroid is 8.8 x 7.3 x
centimeters. RIGHT mid uterine fibroid is 6.5 x 5.4 x
centimeters. LOWER uterine segment fibroid is 2.9 x 2.6 x
centimeters..

Endometrium

Thickness: Endometrium is difficult to evaluate and displaced by
multiple fibroids but is estimated to measure 11.8 millimeters.. No
focal abnormality visualized.

Right ovary

Measurements: The ovary is not visualized, either absent or
obscured. No adnexal mass identified.

Left ovary

Measurements: The ovary is not visualized, either absent or
obscured. No adnexal mass identified.

Other findings

No abnormal free fluid.
IMPRESSION: 1. Multiple uterine fibroids enlarging the uterus. Largest, anterior
fibroid is probably stable.
2. No adnexal mass.
3. Nonvisualized ovaries.

## 2017-06-26 ENCOUNTER — Telehealth: Payer: Self-pay | Admitting: Neurology

## 2017-06-26 NOTE — Telephone Encounter (Signed)
Pt called requesting an appt, stating she has been having pressure behind her eyes which has been causing headaches. Stating this have been going on for a few weeks. Pt would like a call back to discuss.

## 2017-06-26 NOTE — Telephone Encounter (Signed)
Spoke with [name].  She requests a sphenocath for h/a's.  Appt. given tomorrow at 4pm/fim

## 2017-06-26 NOTE — Addendum Note (Signed)
Addended by: Polly Cobia on: 06/26/2017 09:48 AM   Modules accepted: Orders

## 2017-06-27 ENCOUNTER — Ambulatory Visit (INDEPENDENT_AMBULATORY_CARE_PROVIDER_SITE_OTHER): Payer: PRIVATE HEALTH INSURANCE | Admitting: Neurology

## 2017-06-27 ENCOUNTER — Other Ambulatory Visit: Payer: Self-pay

## 2017-06-27 ENCOUNTER — Encounter: Payer: Self-pay | Admitting: Neurology

## 2017-06-27 VITALS — BP 126/80 | HR 72 | Resp 16 | Ht 60.25 in | Wt 181.0 lb

## 2017-06-27 DIAGNOSIS — G4700 Insomnia, unspecified: Secondary | ICD-10-CM

## 2017-06-27 DIAGNOSIS — M542 Cervicalgia: Secondary | ICD-10-CM | POA: Diagnosis not present

## 2017-06-27 DIAGNOSIS — G441 Vascular headache, not elsewhere classified: Secondary | ICD-10-CM

## 2017-06-27 DIAGNOSIS — R569 Unspecified convulsions: Secondary | ICD-10-CM

## 2017-06-27 DIAGNOSIS — F411 Generalized anxiety disorder: Secondary | ICD-10-CM

## 2017-06-27 NOTE — Progress Notes (Signed)
[facility]  PATIENT: Angel Rodriguez DOB: 1975/10/13  REFERRING DOCTOR OR PCP:   SOURCE: patient, ED records, images on PACS, reports in EMR  _________________________________   HISTORICAL  CHIEF COMPLAINT:  Chief Complaint  Patient presents with  . Multiple Sclerosis    Requesting a sphenocath for h/a's./fim    HISTORY OF PRESENT ILLNESS:  Angel Rodriguez is a 42 year old woman with frequent headaches and h/o seizure.  Update 06/27/2017: HA's worsened again last week.    The conjunctivitis returned the next day.   All the pain is on her right side only.     In the past sphenopalatine ganglion blocks have helped the most.    She also has received some benefit form ONB/TPIs in the past.    She denies any additional seizures.      Insomnia was doing better and was helped by clonazepam.  However, she is doing worse since the headache has recurred.  Clonazepam also helps her anxiety.   Update 04/20/2017: Headaches are doing fairly well.   She is now having 3-4 HA's a month.    Verapamil has helped reduce the frequency of headaches.     When needed, Imitrex often helps.     She will sometimes try Tylenol first.   Due to low platelets, she can't take NSAIDs.     The TCPenia was felt to be due to fibroids and these are being treated.     She still gets redness in the right eye, not always associated with HA.    In the past when her headaches were more severe, she had the Sphenocath procedure and it was very beneficial.    Occipital nerve blocks or TPI's have also been beneficial in the past.    She has not had any more seizure.   She just had one in 2016   From 07/11/2016: HA:   Her headaches were doing much better but they are have been occurring more the past month or two. Additionally she continues to have conjunctival redness on the right.     They greatly improved after starting Verapamil and having occipital nerve block/splenius capitis muscle injection at the first  visit and then Elbert at the second, third and fourth visits.  After the second Sphenocath, the conjunctival redness got much better but then returned. After the fourth procedure, she went nearly pain-free for 6 months until milder headaches returned and activities slowly worsening. Pain improves  with Imitrex and rest much of the time.  She no longer can take NSAID    She tolerates the verapamil well but has occasional constipation .     Studies:   MRI of the brain was essentially normal with just a single small T2/FLAIR hyperintense focus in the left frontal subcortical white matter. The MR venogram was normal. ESR and ANA were normal.    Mood:  Anxiety is much better.   Clonazepam with Buspar has helped better than the escitalopram.    Seizure:   In March 2016, she had a seizure.   She had an aura of numbness in her right hand and then passed out.   She is not sure there was any generalized tonic-clonic activity. Her children did not tell her that she had any. However, she did have urinary incontinence. She does not remember much that day.    Since then, she has not had any more seizures.  She has not needed any AED as she had only the one seizure/spell.  Insomnia:   She is sleeping better since starting clonazepam and having much less headache  Thrombocytopenia:   Her platelet count was reduced to 78,000 and she saw her doctor Irene Limbo in hematology. Repeat platelet count was 83,000. She was advised to stop anti-inflammatories.  REVIEW OF SYSTEMS: Constitutional: No fevers, chills, sweats, or change in appetite.  No longer has insomnia Eyes: as above Ear, nose and throat: No hearing loss, ear pain, nasal congestion, sore throat Cardiovascular: No chest pain, palpitations Respiratory: No shortness of breath at rest or with exertion.   No wheezes GastrointestinaI: No nausea, vomiting, diarrhea, abdominal pain, fecal incontinence Genitourinary: No dysuria, urinary retention or frequency.   No nocturia. Musculoskeletal: No neck pain, back pain Integumentary: No rash, pruritus, skin lesions Neurological: as above Psychiatric: No depression at this time.  Notes anxiety, better on clonazepam and Buspar Endocrine: No palpitations, diaphoresis, change in appetite, change in weigh or increased thirst Hematologic/Lymphatic: No anemia, purpura, petechiae. Allergic/Immunologic: No itchy/runny eyes, nasal congestion, recent allergic reactions, rashes  ALLERGIES: Allergies  Allergen Reactions  . Ivp Dye [Iodinated Diagnostic Agents] Hives, Itching and Swelling  . Bee Venom Swelling    HOME MEDICATIONS:  Current Outpatient Medications:  .  acetaminophen (TYLENOL) 500 MG tablet, Take 500 mg by mouth every 6 (six) hours as needed for mild pain or headache., Disp: , Rfl:  .  acetaminophen-codeine (TYLENOL #3) 300-30 MG tablet, One to two po qd, Disp: 60 tablet, Rfl: 3 .  atorvastatin (LIPITOR) 40 MG tablet, Take 40 mg by mouth daily at 6 PM. , Disp: , Rfl:  .  atropine 1 % ophthalmic solution, Place 1 drop into the right eye 3 (three) times daily. , Disp: , Rfl:  .  brimonidine (ALPHAGAN P) 0.1 % SOLN, Place 1 drop into the right eye every 8 (eight) hours. , Disp: , Rfl:  .  busPIRone (BUSPAR) 15 MG tablet, Take 1 tablet (15 mg total) by mouth 2 (two) times daily. (Patient taking differently: Take 15 mg by mouth 2 (two) times daily as needed. ), Disp: 180 tablet, Rfl: 3 .  clonazePAM (KLONOPIN) 0.5 MG tablet, Take 1 tablet (0.5 mg total) by mouth 2 (two) times daily as needed. for anxiety, Disp: 180 tablet, Rfl: 1 .  Cyanocobalamin (VITAMIN B-12 PO), Take 1 tablet by mouth daily. , Disp: , Rfl:  .  dorzolamide-timolol (COSOPT) 22.3-6.8 MG/ML ophthalmic solution, Place 1 drop into the right eye 2 (two) times daily. , Disp: , Rfl:  .  fluticasone (FLONASE) 50 MCG/ACT nasal spray, Place 1-2 sprays into both nostrils daily., Disp: , Rfl:  .  iron polysaccharides (NIFEREX) 150 MG capsule,  Take 1 capsule (150 mg total) by mouth daily., Disp: 60 capsule, Rfl: 0 .  latanoprost (XALATAN) 0.005 % ophthalmic solution, Place 1 drop into both eyes at bedtime., Disp: , Rfl:  .  LUPRON DEPOT, 63-MONTH, 3.75 MG injection, , Disp: , Rfl:  .  moxifloxacin (VIGAMOX) 0.5 % ophthalmic solution, Vigamox 0.5 % eye drops  INSTILL 1 DROP INTO AFFECTED EYE(S) BY OPHTHALMIC ROUTE 3 TIMES PER DAY, Disp: , Rfl:  .  Multiple Vitamin (MULTI-VITAMINS) TABS, Take 1 tablet by mouth daily. , Disp: , Rfl:  .  norethindrone (AYGESTIN) 5 MG tablet, Take 1 tablet (5 mg total) by mouth 2 (two) times daily. Once bleeding stops continue 1 po daily., Disp: 60 tablet, Rfl: 4 .  SUMAtriptan (IMITREX) 100 MG tablet, Take 1 tablet (100 mg total) by mouth once as needed  for migraine. May repeat in 2 hours if headache persists or recurs., Disp: 18 tablet, Rfl: 3 .  traMADol (ULTRAM) 50 MG tablet, Take 1 tablet (50 mg total) by mouth every 6 (six) hours as needed. Can take 2 tablets every 6 hours for a max dose of '400mg'$ /day, if needed., Disp: 40 tablet, Rfl: 0 .  verapamil (CALAN-SR) 240 MG CR tablet, Take 1 tablet (240 mg total) by mouth at bedtime., Disp: 90 tablet, Rfl: 3 No current facility-administered medications for this visit.   Facility-Administered Medications Ordered in Other Visits:  .  gadopentetate dimeglumine (MAGNEVIST) injection 16 mL, 16 mL, Intravenous, Once PRN, Rene Sizelove, Nanine Means, MD  PAST MEDICAL HISTORY: Past Medical History:  Diagnosis Date  . Abnormal Pap smear of cervix   . Anxiety   . Blind right eye    swelling & pressure  . BV (bacterial vaginosis)   . Cluster headache   . Depression   . Dysmenorrhea   . Eye disorder   . Fibroids   . History of pneumonia   . History of trichomoniasis   . Menorrhagia   . Migraines   . Seizure ([place])     PAST SURGICAL HISTORY: Past Surgical History:  Procedure Laterality Date  . ABLATION    . COLPOSCOPY    . EYE SURGERY    . HYSTEROSCOPY W/D&C    .  TUBAL LIGATION    . WISDOM TOOTH EXTRACTION      FAMILY HISTORY: Family History  Problem Relation Age of Onset  . Alcoholism Father   . Cirrhosis Father   . Cancer Maternal Grandmother        unsure of type    SOCIAL HISTORY:  Social History   Socioeconomic History  . Marital status: Single    Spouse name: Not on file  . Number of children: Not on file  . Years of education: Not on file  . Highest education level: Not on file  Occupational History  . Not on file  Social Needs  . Financial resource strain: Not on file  . Food insecurity:    Worry: Not on file    Inability: Not on file  . Transportation needs:    Medical: Not on file    Non-medical: Not on file  Tobacco Use  . Smoking status: Current Some Day Smoker    Types: E-cigarettes  . Smokeless tobacco: Never Used  Substance and Sexual Activity  . Alcohol use: Yes    Comment: occasional  . Drug use: No  . Sexual activity: Yes    Birth control/protection: Surgical    Comment: btl  Lifestyle  . Physical activity:    Days per week: Not on file    Minutes per session: Not on file  . Stress: Not on file  Relationships  . Social connections:    Talks on phone: Not on file    Gets together: Not on file    Attends religious service: Not on file    Active member of club or organization: Not on file    Attends meetings of clubs or organizations: Not on file    Relationship status: Not on file  . Intimate partner violence:    Fear of current or ex partner: Not on file    Emotionally abused: Not on file    Physically abused: Not on file    Forced sexual activity: Not on file  Other Topics Concern  . Not on file  Social History Narrative  .  Not on file     PHYSICAL EXAM  Vitals:   06/27/17 1608  BP: 126/80  Pulse: 72  Resp: 16  Weight: 181 lb (82.1 kg)  Height: 5' 0.25" (1.53 m)    Body mass index is 35.06 kg/m.   General: The patient is well-developed and well-nourished and in no acute  distress  Eyes:    She has conjunctival erythema.  Funduscopic examination did not show any papilledema  Neck: The neck is supple. she is tender over the right occiput and splenius capitis muscle.  Range of motion is fairly normal   Neurologic Exam  Mental status: The patient is alert and oriented x 3 at the time of the examination. The patient has apparent normal recent and remote memory, with an apparently normal attention span and concentration ability.   Speech is normal.  Cranial nerves: Extraocular movements are full.  There is mild right ptosis with normal facial strength elsewhere.. There is normal facial sensation to soft touch.   Trapezius and sternocleidomastoid strength is normal. No dysarthria is noted.  The tongue is midline, and the patient has symmetric elevation of the soft palate. No obvious hearing deficits are noted.  Motor:  Muscle bulk is normal.   Tone is normal. Strength is  5 / 5 in all 4 extremities.   Sensory: She has normal sensation to touch and vibration in the arms and legs   Gait and station: Station is normal.   Gait is normal. Tandem gait is normal. Romberg is negative.       DIAGNOSTIC DATA (LABS, IMAGING, TESTING) - I reviewed patient records, labs, notes, testing and imaging myself where available.  Lab Results  Component Value Date   WBC 9.6 03/29/2017   HGB 8.2 (L) 03/29/2017   HCT 24.1 (L) 03/29/2017   MCV 90.9 03/29/2017   PLT 76 (L) 03/29/2017      Component Value Date/Time   NA 138 08/31/2016 0957   K 3.9 08/31/2016 0957   CL 111 05/30/2014 1215   CO2 22 08/31/2016 0957   GLUCOSE 106 08/31/2016 0957   BUN 8.6 08/31/2016 0957   CREATININE 0.8 08/31/2016 0957   CALCIUM 10.3 08/31/2016 0957   PROT 7.6 08/31/2016 0957   ALBUMIN 3.9 08/31/2016 0957   AST 18 08/31/2016 0957   ALT 23 08/31/2016 0957   ALKPHOS 62 08/31/2016 0957   BILITOT 0.43 08/31/2016 0957   GFRNONAA >90 05/30/2014 1215   GFRAA >90 05/30/2014 1215       PROCEDURE  The patient was placed in the supine position. A temperature strip was added to the cheek area after the area was cleaned with alcohol. The Sphenocath was lubricated with gel, and placed in the right naris. The catheter was inserted above the middle turbinate to the posterior nasal cavity, and then withdrawn 1 cm. The catheter was deployed and rotated approximately 20 towards the nose. 2-1/2 mL of 2% lidocaine was deployed. The patient was asked to swallow during the injection. The patient demonstrated erythema of the sclera of the eye on this side, and an increase in the cheek temperature was noted from [name] to 34.5C.  The patient tolerated the procedure well. No complications of the procedure were noted. The patient was kept in the supine position for 3 minutes following the procedure. She was given small sips of water after sitting up following the procedure.    ASSESSMENT AND PLAN  Vascular headache  Convulsions, unspecified convulsion type ([place])  Anxiety state  Insomnia, unspecified type    1.   Sphenocath procedure on the right (right sphenopalatine ganglia block and right trigeminal nerve block).   Pain was better afterwards.  2.   Trigger point injection of the right splenius capitis and splenius cervicis muscle with 80 mg Depo-Medrol in 3 mL Marcaine. She tolerated the procedure well and pain was better afterwards.  3.    Verapamil for vascular headache prophylaxis.   4.  Clonazepam 0.5 mg twice a day for anxiety and insomnia.   5.   She will return in 6 months or sooner if there are new or worsening symptoms.  Deneane Stifter A. Felecia Shelling, MD, PhD 4/46/2863, 8:17 PM Certified in Neurology, Clinical Neurophysiology, Sleep Medicine, Pain Medicine and Neuroimaging  [facility] [address] [place], [place] [address] [phone] [id number]

## 2017-07-05 ENCOUNTER — Ambulatory Visit (INDEPENDENT_AMBULATORY_CARE_PROVIDER_SITE_OTHER): Payer: PRIVATE HEALTH INSURANCE | Admitting: Obstetrics & Gynecology

## 2017-07-05 VITALS — BP 136/80 | HR 80 | Resp 16 | Ht 60.25 in | Wt 180.0 lb

## 2017-07-05 DIAGNOSIS — D500 Iron deficiency anemia secondary to blood loss (chronic): Secondary | ICD-10-CM

## 2017-07-05 DIAGNOSIS — D696 Thrombocytopenia, unspecified: Secondary | ICD-10-CM | POA: Diagnosis not present

## 2017-07-05 DIAGNOSIS — N921 Excessive and frequent menstruation with irregular cycle: Secondary | ICD-10-CM | POA: Diagnosis not present

## 2017-07-05 DIAGNOSIS — D219 Benign neoplasm of connective and other soft tissue, unspecified: Secondary | ICD-10-CM | POA: Diagnosis not present

## 2017-07-05 DIAGNOSIS — N852 Hypertrophy of uterus: Secondary | ICD-10-CM

## 2017-07-05 NOTE — Progress Notes (Signed)
GYNECOLOGY  VISIT  CC:   Discuss ultrasound and surgery  HPI: 42 y.o. [id number] Single Caucasian female here to review ultrasound results.  Pt has hx of menorrhagia, iron deficiency anemia and significantly enlarged uterus due to fibroids.  She has been on Depo Lupron for 3 months with significant improvement in her hemoglobin.  On January 24 her hemoglobin was 8.2 and just recently on April 10 her hemoglobin was 13.4.  Her initial ultrasound was performed on 124/19 as well.  This showed a 20 x 12.6 x 13 cm enlarged uterus there were multiple fibroids noted.  The largest anterior wall fibroid was 10 x 11 x 11.9 cm.  She had a lateral right mid fibroid measuring 4.9 x 3.7 x 5.00 cm and then another fibroid measuring 5.4 x 5.2 x 6.3 cm.  Endometrium measured 5 mm.  She also had a complex cyst on the right ovary with a slightly thickened septation consistent with either 2 adjacent cysts or complex cyst.  The ovary measured 5.3 x 3.4 x 3.0 cm.  Her repeat ultrasound on 06/22/17 showed the uterus to measure 21.6 x 12.7 x 14.8 cm.  The largest anterior fibroid measured to 12.4 x 12.7 x 5.3 cm the right fundal fibroid measured 8.8 x 7.0 7.5 cm.  The right mid lower uterine fibroid measuring 6.5 x 5.4 x 5.8 cm and a lower uterine segment fibroid measuring 2.9 x 2.6 x 2.8 cm.  The endometrium was difficult to visualize but measured 11.8 mm.  No ovarian abnormality was noted.  Given the lack of change in the size of uterus and in fact some increased size.  I feel it is important for [place] to go ahead and proceed with surgery at this time.  She is symptomatic from the size and bulk of her fibroids.  She does need to have her platelets repeated and I am going to reach out to her neurologist to see if there is any particular clearance we need.  She does have a history of cluster headaches and has limited vision in the right eye.  We discussed the approach for the surgery.  I hope we can do this laparoscopically with excision  of the fibroid through  an umbilical incision with the specimen in the Alexis bag.  She is aware that this might need to be done as an open procedure.  This determination would likely have been made the day of surgery.  Procedure, laparoscopic incisions, risks including bleeding, infection, bowel/bladder/ureteral injury, DVT/PE, need for open procedure with longer hospital stay are reviewed with patient.  She will need another Depo-Lupron injection next one is due after the 11th.  We will certainly not have her surgery scheduled and completed by that time.  Patient states she will go ahead and order it at this time.  GYNECOLOGIC HISTORY: No LMP recorded. Patient has had an injection. Contraception: tubal ligation Menopausal hormone therapy: none  Patient Active Problem List   Diagnosis Date Noted  . Abnormal MRI of head 01/21/2016  . Anemia 01/12/2016  . Postpartum depression 01/12/2016  . Vascular headache 09/15/2015  . Insomnia 09/15/2015  . Headache 07/08/2015  . Headache, chronic migraine without aura, intractable 07/08/2015  . Anxiety state 07/08/2015  . Neck pain 06/24/2015  . Convulsions ([place]) 06/24/2015  . Fibroids   . Menorrhagia   . Dysmenorrhea   . Central retinal vein occlusion 12/11/2011  . Cotton wool exudates 12/11/2011  . Cystoid macular edema 12/11/2011    Past Medical History:  Diagnosis Date  . Abnormal Pap smear of cervix   . Anxiety   . Blind right eye    swelling & pressure  . BV (bacterial vaginosis)   . Cluster headache   . Depression   . Dysmenorrhea   . Eye disorder   . Fibroids   . History of pneumonia   . History of trichomoniasis   . Menorrhagia   . Migraines   . Seizure [facility])     Past Surgical History:  Procedure Laterality Date  . ABLATION    . COLPOSCOPY    . EYE SURGERY    . HYSTEROSCOPY W/D&C    . TUBAL LIGATION    . WISDOM TOOTH EXTRACTION      MEDS:   Current Outpatient Medications on File Prior to Visit  Medication Sig  Dispense Refill  . acetaminophen (TYLENOL) 500 MG tablet Take 500 mg by mouth every 6 (six) hours as needed for mild pain or headache.    [name] acetaminophen-codeine (TYLENOL #3) 300-30 MG tablet One to two po qd 60 tablet 3  . atorvastatin (LIPITOR) 40 MG tablet Take 40 mg by mouth daily at 6 PM.     . atropine 1 % ophthalmic solution Place 1 drop into the right eye 3 (three) times daily.     . brimonidine (ALPHAGAN P) 0.1 % SOLN Place 1 drop into the right eye every 8 (eight) hours.     . busPIRone (BUSPAR) 15 MG tablet Take 1 tablet (15 mg total) by mouth 2 (two) times daily. (Patient taking differently: Take 15 mg by mouth 2 (two) times daily as needed. ) 180 tablet 3  . clonazePAM (KLONOPIN) 0.5 MG tablet Take 1 tablet (0.5 mg total) by mouth 2 (two) times daily as needed. for anxiety 180 tablet 1  . dorzolamide-timolol (COSOPT) 22.3-6.8 MG/ML ophthalmic solution Place 1 drop into the right eye 2 (two) times daily.     . fluticasone (FLONASE) 50 MCG/ACT nasal spray Place 1-2 sprays into both nostrils daily.    [name] latanoprost (XALATAN) 0.005 % ophthalmic solution Place 1 drop into both eyes at bedtime.    [name] LUPRON DEPOT, 4-MONTH, 3.75 MG injection     . moxifloxacin (VIGAMOX) 0.5 % ophthalmic solution Vigamox 0.5 % eye drops  INSTILL 1 DROP INTO AFFECTED EYE(S) BY OPHTHALMIC ROUTE 3 TIMES PER DAY    . Multiple Vitamin (MULTI-VITAMINS) TABS Take 1 tablet by mouth daily.     . norethindrone (AYGESTIN) 5 MG tablet Take 1 tablet (5 mg total) by mouth 2 (two) times daily. Once bleeding stops continue 1 po daily. 60 tablet 4  . SUMAtriptan (IMITREX) 100 MG tablet Take 1 tablet (100 mg total) by mouth once as needed for migraine. May repeat in 2 hours if headache persists or recurs. 18 tablet 3  . traMADol (ULTRAM) 50 MG tablet Take 1 tablet (50 mg total) by mouth every 6 (six) hours as needed. Can take 2 tablets every 6 hours for a max dose of 400mg /day, if needed. 40 tablet 0  . verapamil (CALAN-SR) 240  MG CR tablet Take 1 tablet (240 mg total) by mouth at bedtime. 90 tablet 3  . Cyanocobalamin (VITAMIN B-12 PO) Take 1 tablet by mouth daily.      Current Facility-Administered Medications on File Prior to Visit  Medication Dose Route Frequency Provider Last Rate Last Dose  . gadopentetate dimeglumine (MAGNEVIST) injection 16 mL  16 mL Intravenous Once PRN Sater, Nanine Means, MD  ALLERGIES: Ivp dye [iodinated diagnostic agents] and Bee venom  Family History  Problem Relation Age of Onset  . Alcoholism Father   . Cirrhosis Father   . Cancer Maternal Grandmother        unsure of type    SH: Single, smokes e-cigarettes  Review of Systems  Constitutional:       Weight gain   HENT: Positive for congestion.   Gastrointestinal: Positive for constipation.       Bloating   Genitourinary:       Unscheduled bleeding  Loss of sexual interest  Loss of urine with sneeze or cough   Musculoskeletal:       Muscle weakness   Neurological: Positive for headaches.  Endo/Heme/Allergies:       Heat/cold intolerance   Psychiatric/Behavioral: Positive for depression. The patient is nervous/anxious.        Difficulty with memory  Excessive crying   All other systems reviewed and are negative.   PHYSICAL EXAMINATION:    BP 136/80 (BP Location: Right Arm, Patient Position: Sitting, Cuff Size: Large)   Pulse 80   Resp 16   Ht 5' 0.25" (1.53 m)   Wt 180 lb (81.6 kg)   BMI 34.86 kg/m      General appearance: alert, cooperative and appears stated age CV:  Regular rate and rhythm Lungs:  clear to auscultation, no wheezes, rales or rhonchi, symmetric air entry Abdomen: soft, non-tender; bowel sounds normal; pelvic mass that is larger on the right and extends slightly above the umbilicus on the left, no organomegaly  Pelvic: External genitalia:  no lesions              Urethra:  normal appearing urethra with no masses, tenderness or lesions              Bartholins and Skenes: normal                  Vagina: normal appearing vagina with normal color and discharge, no lesions              Cervix: no lesions              Bimanual Exam:  Uterus:  enlarged, 20 weeks size, pt is very uncomfortable when trying to lift uterus up out of the pelvis, nodular              Adnexa: no mass, fullness, tenderness but difficult to examine due to size of uterus  Chaperone was present for exam.  Assessment: Grossly enlarged uterus with fibroids No decrease in size with Depo Lupron H/o anemia that has resolved H/o of thrombocytopaenia, has been seen by Dr. 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 Felecia Shelling for any neurology recommendations Will refer back to Dr. Irene Limbo for pre-op treatment of thrombocytopenia and recommendations for post op pain management as NSAIDs have not been recommended   ~25 minutes spent with patient >50% of time was in face to face discussion of above.

## 2017-07-08 ENCOUNTER — Encounter: Payer: Self-pay | Admitting: Obstetrics & Gynecology

## 2017-07-09 ENCOUNTER — Telehealth: Payer: Self-pay | Admitting: *Deleted

## 2017-07-09 ENCOUNTER — Telehealth: Payer: Self-pay | Admitting: Neurology

## 2017-07-09 NOTE — Telephone Encounter (Signed)
Lauren from Dr Grier Mitts office returned call. They will contact patient to schedule office visit for surgical clearance. As far as pain management, he recommends she not take NSAIDS. After narcotics, patient would need to wean to Tylenol.  Lauren is aware of planned date for surgery. They will contact patient to schedule office visit.

## 2017-07-09 NOTE — Telephone Encounter (Signed)
Call to [facility], spoke to [employer]. Left message requesting surgical clearance for patient who was last seen by Dr Felecia Shelling on 06-27-17. Crystal to send message and someone will call back.

## 2017-07-09 NOTE — Telephone Encounter (Signed)
[name] at [employer] [phone]

## 2017-07-09 NOTE — Telephone Encounter (Signed)
Call to Dr Irene Limbo office. Patient has not yet scheduled appointment. Left message on nurse voice mail like advising of surgery scheduled for 07-31-17. Requesting office visit for review of pain management options since NSAIDS are not recommended.  Also, if any additional instructions for patient management due to  thrombocytopenia.

## 2017-07-09 NOTE — Telephone Encounter (Signed)
Received call from [facility] @ Dr. Ammie Ferrier office GYN.  Pt scheduled for total hysterectomy on 5/28, and patient needs clearance for surgery due to thrombocytopenia.  Also requesting recommendations for pain management post procedure.  Per Dr. Irene Limbo, patient should avoid all NSAIDS.  Patient can try tylenol as needed.  We will schedule to see patient in the next week to review labs.  Message sent to scheduling.

## 2017-07-09 NOTE — Telephone Encounter (Signed)
Gay Filler with the [facility] requesting a call back to get clearance for the pt to have surgery on 5/28. Gay Filler can be reched at [phone]

## 2017-07-09 NOTE — Telephone Encounter (Signed)
Call to [facility]. Per sandy, pharmacist, lupron was ordered by patient and shipped to [place] on 07-06-17.     Call to patient. Confirmed lupron orde. Appointment for injection scheduled for Fri 07-13-17. Advised of need for appointment with Dr Irene Limbo to review pain management since unable to take NSAIDS. Possible surgery date of 07-31-17 discussed.

## 2017-07-09 NOTE — Telephone Encounter (Signed)
Call to [name] at [facility]. Per faith, patient has no neurological reasons not to proceed with surgery. They cant speak to any other surgical clearance but there is no neurological issue preventing surgery.   Routing to provider for final review. Patient agreeable to disposition. Will close encounter.

## 2017-07-09 NOTE — Telephone Encounter (Signed)
Spoke with Hailey and left message for Gay Filler to call back.  There is not a neurologic condition that would prevent pt. from having surgery/fim

## 2017-07-10 ENCOUNTER — Telehealth: Payer: Self-pay

## 2017-07-10 ENCOUNTER — Telehealth: Payer: Self-pay | Admitting: Obstetrics & Gynecology

## 2017-07-10 ENCOUNTER — Telehealth: Payer: Self-pay | Admitting: *Deleted

## 2017-07-10 NOTE — Telephone Encounter (Signed)
Spoke with patient concerning upcoming appointment, and added a lab. Per 5/7 in basket. I changed the appointment and the patient requested it back on the 9th

## 2017-07-10 NOTE — Telephone Encounter (Signed)
Spoke with patient. Patient states that she needs to reschedule her Lupron injection. Requesting an appointment for tomorrow. Advised next injection needs to be given after 07/14/2017. Appointment rescheduled for 07/16/2017 at 4 pm. Patient is agreeable to date and time.  Routing to provider for final review. Patient agreeable to disposition. Will close encounter.

## 2017-07-10 NOTE — Telephone Encounter (Signed)
Patient called to reschedule her appointment on 07/13/17.

## 2017-07-10 NOTE — Telephone Encounter (Signed)
Call to patient approximately 1030  This am. Advised Dr Sabra Heck has checked with neurology and does not need neuro appointment. (Dr Sabra Heck communicated with Dr Felecia Shelling via staff message). Advised needs appointment with Dr Irene Limbo as soon as available. Advised platelet count must be > 100K/uL to proceed with surgery,  per Dr Sabra Heck.  Patient states she will call Dr Grier Mitts office this am.   As of entry of phone note, patient is now scheduled for 07-12-17 with Dr Irene Limbo.  Routing to provider for final review.  Will close encounter.

## 2017-07-10 NOTE — Telephone Encounter (Signed)
Returning call to [place].

## 2017-07-10 NOTE — Telephone Encounter (Signed)
Left message to call Kaitlyn at [phone]. 

## 2017-07-11 NOTE — Progress Notes (Addendum)
[name]    HEMATOLOGY/ONCOLOGY CLINIC NOTE  Date of Service: 07/12/17  Patient Care Team: Damaris Hippo, MD as PCP - General (Family Medicine)  CHIEF COMPLAINTS/PURPOSE OF CONSULTATION:  Thrombocytopenia.  HISTORY OF PRESENTING ILLNESS:   Angel Rodriguez is a wonderful 42 y.o. female who has been referred to [place] by Dr .Damaris Hippo, MD  for evaluation and management of thrombocytopenia.  Patient has a history of obesity, anxiety, menorrhagia related to urinary tract infection as well as had a history of mild thrombocytopenia for at least 1-2 years. She was noted to have a platelet count of 99k in March 2016 with a normal hemoglobin and WBC count. She recently had routine labs with her primary care physician on 02/16/2016 and was noted to have a platelet count of 78k with a hemoglobin of 13.1 and a WBC count of 7.5k.  Patient notes some easy bruisability but no overt bleeding. No nosebleeds no, bleeds no GI bleeding or hematuria. Does tend to have menorrhagia related to uterine fibroids but notes that this is much improved since her endometrial ablation.  No fever no chills no night sweats. Has been gaining some weight but is trying to work on this. Has had issues with intermittent cluster headaches for which she has been on a few prednisone bursts.  She does use NSAIDs on an off for dysmenorrhea . Denies any other significant new medications. Has previously had some issues with excessive alcohol use but notes that she has been drinking only a couple of drinks a week recently. Smoking about 5 cigarettes a week. Denies any other drug abuse.  INTERVAL HISTORY:   Angel Rodriguez returns today regarding her thrombocytopenia. The patient's last visit with [place] was on 08/31/16. The pt reports that she is doing well overall. The pt will be having a total laparoscopic hysterectomy with salpingectomy on 07/31/17 with Dr Hale Bogus.   The pt reports painful, heavy periods. She continues taking her vitamins  and B12 replacement, and avoids NSAIDs since her last visit with [place].   Of note since the patient's last visit, pt has had US Pelvic completed on 06/22/17 with results revealing 1. Multiple uterine fibroids enlarging the uterus. Largest, anterior fibroid is probably stable. 2. No adnexal mass. 3. Nonvisualized ovaries.  Lab results today (07/12/17) of CBC, CMP, and Reticulocytes is as follows: all values are WNL except for PLT at 87k and CO2 at 19.  On review of systems, pt reports heavy periods, and denies any other symptoms.    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 Unclear etiology. Patient notes she had extensive workup which was unrevealing. Patient notes she has minimal vision in that eye. Obesity Dyslipidemia Anxiety Cluster headaches - follows with [facility] and [facility].   SURGICAL HISTORY: Past Surgical History:  Procedure Laterality Date   ABLATION     COLPOSCOPY     EYE SURGERY     HYSTEROSCOPY W/D&C     TUBAL  LIGATION     WISDOM TOOTH EXTRACTION      SOCIAL HISTORY: Social History   Socioeconomic History   Marital status: Single    Spouse name: Not on file   Number of children: Not on file   Years of education: Not on file   Highest education level: Not on file  Occupational History   Not on file  Social Needs   Financial resource strain: Not on file   Food insecurity:    Worry: Not on file    Inability: Not on file   Transportation needs:    Medical: Not on file    Non-medical: Not on file  Tobacco Use    Smoking status: Current Some Day Smoker    Types: E-cigarettes   Smokeless tobacco: Never Used  Substance and Sexual Activity   Alcohol use: Yes    Comment: occasional   Drug use: No   Sexual activity: Yes    Birth control/protection: Surgical    Comment: btl  Lifestyle   Physical activity:    Days per week: Not on file    Minutes per session: Not on file   Stress: Not on file  Relationships   Social connections:    Talks on phone: Not on file    Gets together: Not on file    Attends religious service: Not on file    Active member of club or organization: Not on file    Attends meetings of clubs or organizations: Not on file    Relationship status: Not on file   Intimate partner violence:    Fear of current or ex partner: Not on file    Emotionally abused: Not on file    Physically abused: Not on file    Forced sexual activity: Not on file  Other Topics Concern   Not on file  Social History Narrative   Not on file  Drinks about 2 drinks a week.  FAMILY HISTORY: Family History  Problem Relation Age of Onset   Alcoholism Father    Cirrhosis Father    Cancer Maternal Grandmother        unsure of type    ALLERGIES:  is allergic to ivp dye [iodinated diagnostic agents] and bee venom.  MEDICATIONS:  Current Outpatient Medications  Medication Sig Dispense Refill   acetaminophen (TYLENOL) 500 MG tablet Take 500 mg by mouth every 6 (six) hours as needed for mild pain or headache.     acetaminophen-codeine (TYLENOL #3) 300-30 MG tablet One to two po qd 60 tablet 3   atorvastatin (LIPITOR) 40 MG tablet Take 40 mg by mouth daily at 6 PM.      atropine 1 % ophthalmic solution Place 1 drop into the right eye 3 (three) times daily.      brimonidine (ALPHAGAN P) 0.1 % SOLN Place 1 drop into the right eye every 8 (eight) hours.      busPIRone (BUSPAR) 15 MG tablet Take 1 tablet (15 mg total) by mouth 2 (two) times daily. (Patient taking differently: Take 15 mg  by mouth 2 (two) times daily as needed. ) 180 tablet 3   clonazePAM (KLONOPIN) 0.5 MG tablet Take 1 tablet (0.5 mg total) by mouth 2 (two) times daily as needed. for anxiety 180 tablet 1   Cyanocobalamin (VITAMIN B-12 PO) Take 1 tablet by mouth daily.      dorzolamide-timolol (COSOPT) 22.3-6.8 MG/ML ophthalmic solution Place 1 drop into the right eye 2 (two) times daily.  fluticasone (FLONASE) 50 MCG/ACT nasal spray Place 1-2 sprays into both nostrils daily.     latanoprost (XALATAN) 0.005 % ophthalmic solution Place 1 drop into both eyes at bedtime.     LUPRON DEPOT, 85-MONTH, 3.75 MG injection      moxifloxacin (VIGAMOX) 0.5 % ophthalmic solution Vigamox 0.5 % eye drops  INSTILL 1 DROP INTO AFFECTED EYE(S) BY OPHTHALMIC ROUTE 3 TIMES PER DAY     Multiple Vitamin (MULTI-VITAMINS) TABS Take 1 tablet by mouth daily.      norethindrone (AYGESTIN) 5 MG tablet Take 1 tablet (5 mg total) by mouth 2 (two) times daily. Once bleeding stops continue 1 po daily. 60 tablet 4   SUMAtriptan (IMITREX) 100 MG tablet Take 1 tablet (100 mg total) by mouth once as needed for migraine. May repeat in 2 hours if headache persists or recurs. 18 tablet 3   traMADol (ULTRAM) 50 MG tablet Take 1 tablet (50 mg total) by mouth every 6 (six) hours as needed. Can take 2 tablets every 6 hours for a max dose of 400mg /day, if needed. 40 tablet 0   verapamil (CALAN-SR) 240 MG CR tablet Take 1 tablet (240 mg total) by mouth at bedtime. 90 tablet 3   No current facility-administered medications for this visit.    Facility-Administered Medications Ordered in Other Visits  Medication Dose Route Frequency Provider Last Rate Last Dose   gadopentetate dimeglumine (MAGNEVIST) injection 16 mL  16 mL Intravenous Once PRN Sater, Nanine Means, MD        REVIEW OF SYSTEMS:    A 10+ POINT REVIEW OF SYSTEMS WAS OBTAINED including neurology, dermatology, psychiatry, cardiac, respiratory, lymph, extremities, GI, GU,  Musculoskeletal, constitutional, breasts, reproductive, HEENT.  All pertinent positives are noted in the HPI.  All others are negative.   PHYSICAL EXAMINATION: ECOG PERFORMANCE STATUS: 0 - Asymptomatic  . Vitals:   07/12/17 0959  BP: 129/78  Pulse: 69  Resp: 18  Temp: 98.9 F (37.2 C)  SpO2: 95%   Filed Weights   07/12/17 0959  Weight: 177 lb 11.2 oz (80.6 kg)   .Body mass index is 34.42 kg/m.  GENERAL:alert, in no acute distress and comfortable SKIN: no acute rashes, no significant lesions EYES: conjunctiva are pink and non-injected, sclera anicteric OROPHARYNX: MMM, no exudates, no oropharyngeal erythema or ulceration NECK: supple, no JVD LYMPH:  no palpable lymphadenopathy in the cervical, axillary or inguinal regions LUNGS: clear to auscultation b/l with normal respiratory effort HEART: regular rate & rhythm ABDOMEN:  normoactive bowel sounds , non tender, not distended, no hepatosplenomegaly.  Extremity: no pedal edema PSYCH: alert & oriented x 3 with fluent speech NEURO: no focal motor/sensory deficits   LABORATORY DATA:  I have reviewed the data as listed  . CBC Latest Ref Rng & Units 07/12/2017 03/29/2017 08/31/2016  WBC 3.9 - 10.3 K/uL 7.3 9.6 6.4  Hemoglobin 11.6 - 15.9 g/dL 14.9 8.2(L) 13.7  Hematocrit 34.8 - 46.6 % 45.0 24.1(L) 40.2  Platelets 145 - 400 K/uL 87(L) 76(L) 71(L)    . CMP Latest Ref Rng & Units 07/12/2017 08/31/2016 04/19/2016  Glucose 70 - 140 mg/dL 139 106 111  BUN 7 - 26 mg/dL 8 8.6 7.0  Creatinine 0.60 - 1.10 mg/dL 0.83 0.8 0.7  Sodium 136 - 145 mmol/L 137 138 138  Potassium 3.5 - 5.1 mmol/L 3.5 3.9 3.9  Chloride 98 - 109 mmol/L 108 - -  CO2 22 - 29 mmol/L 19(L) 22 21(L)  Calcium 8.4 - 10.4 mg/dL  10.3 10.3 9.9  Total Protein 6.4 - 8.3 g/dL 8.0 7.6 7.8  Total Bilirubin 0.2 - 1.2 mg/dL 0.3 0.43 0.46  Alkaline Phos 40 - 150 U/L 41 62 61  AST 5 - 34 U/L 13 18 15   ALT 0 - 55 U/L 28 23 19      RADIOGRAPHIC STUDIES: I have personally  reviewed the radiological images as listed and agreed with the findings in the report. US Pelvic Complete With Transvaginal  Result Date: 06/22/2017 CLINICAL DATA:  Heavy bleeding for 6 months. History of bilateral tubal ligation and fibroids. Patient receiving Depo Lupron injections. EXAM: TRANSABDOMINAL AND TRANSVAGINAL ULTRASOUND OF PELVIS TECHNIQUE: Both transabdominal and transvaginal ultrasound examinations of the pelvis were performed. Transabdominal technique was performed for global imaging of the pelvis including uterus, ovaries, adnexal regions, and pelvic cul-de-sac. It was necessary to proceed with endovaginal exam following the transabdominal exam to visualize the adnexal regions. COMPARISON:  03/29/2017 FINDINGS: Uterus Measurements: Uterus is enlarged and difficult to measure but is estimated to be at least 21.6 x 12.7 x 14.8 centimeters. Multiple fibroids are present. Large anterior fibroid is 12.4 x 12.7 x 5.3 centimeters. RIGHT fundal fibroid is 8.8 x 7.3 x 7.5 centimeters. RIGHT mid uterine fibroid is 6.5 x 5.4 x 5.8 centimeters. LOWER uterine segment fibroid is 2.9 x 2.6 x 2.8 centimeters. Endometrium Thickness: Endometrium is difficult to evaluate and displaced by multiple fibroids but is estimated to measure 11.8 millimeters. No focal abnormality visualized. Right ovary Measurements: The ovary is not visualized, either absent or obscured. No adnexal mass identified. Left ovary Measurements: The ovary is not visualized, either absent or obscured. No adnexal mass identified. Other findings No abnormal free fluid. IMPRESSION: 1. Multiple uterine fibroids enlarging the uterus. Largest, anterior fibroid is probably stable. 2. No adnexal mass. 3. Nonvisualized ovaries. Electronically Signed   By: Nolon Nations M.D.   On: 06/22/2017 11:17    ASSESSMENT & PLAN:   42 year old female with  #1 Mild-Moderate thrombocytopenia.  No associated anemia. Borderline elevation of WBC count which  could be due to steroid use. Her platelets were about 99k in March 2016. 78k in December 2017 and was previously down to 71k. Platelets are higher today at 87k. No issues with overt bleeding. LDH within normal limits. No symptoms suggestive of lymphoproliferative process. Peripheral blood smear showed no platelet clumping few large platelets. Some decrease in platelets could be due to increased consumption from her heavy periods. Patients age and Absence of associated anemia or leukopenia suggest against a broader bone marrow process. 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 Component     Latest Ref Rng & Units 04/19/2016  LDH     125 - 245 U/L 171  Hep C Virus Ab     0.0 - 0.9 s/co ratio <0.1  HIV Screen 4th Generation wRfx     Non Reactive Non Reactive   PLAN -Discussed pt labwork today, 07/12/17; PLT at 87k -PLT at 87k are adequate for the upcoming total laparoscopic hysterectomy with salpingectomy on 07/31/17 with Dr Hale Bogus.  on 07/31/17.  -If surgeon feels PLT are too low, they may give pt PLT immediately before surgery. If any concerns for bleeding during or prior to surgery, if surgeons preference, could consider one aphresed unit of platelets.  -Avoid IV or oral NSAIDs, post-operative pain management might want to use tramadol or narcotics as per surgeon, given the pt's complex hx of pain management with chronic headaches, would recommend that pt follow up PCP  or neurologist for longer-term pain management.  -Strongly encouraged pt to establish care with a PCP -Will see pt back in 3-4 months for PLT check post surgery.   -Recommended holding off on alcohol use. -No indication for treatment of her thrombocytopenia this time even if this were ITP. -Reasonable to take a daily B complex to address any other possible vitamin B deficiencies. -Follow-up with primary care physician for management of other medical comorbidities  RTC with Dr Irene Limbo in 4 months with labs   All of  the patients questions were answered with apparent satisfaction. The patient knows to call the clinic with any problems, questions or concerns.  The toal time spent in the appt was 20 minutes and more than 50% was on counseling and direct patient cares.    Sullivan Lone MD [place] AAHIVMS [facility] Hematology/Oncology Physician [place]  (Office):       [phone] (Work cell):  [phone] (Fax):           [phone]  This document serves as a record of services personally performed by Sullivan Lone, MD. It was created on his behalf by Baldwin Jamaica, a trained medical scribe. The creation of this record is based on the scribe's personal observations and the provider's statements to them.   .I have reviewed the above documentation for accuracy and completeness, and I agree with the above. Brunetta Genera MD MS

## 2017-07-12 ENCOUNTER — Ambulatory Visit: Payer: PRIVATE HEALTH INSURANCE | Admitting: Hematology

## 2017-07-12 ENCOUNTER — Inpatient Hospital Stay: Payer: PRIVATE HEALTH INSURANCE

## 2017-07-12 ENCOUNTER — Inpatient Hospital Stay: Payer: PRIVATE HEALTH INSURANCE | Attending: Hematology | Admitting: Hematology

## 2017-07-12 ENCOUNTER — Encounter: Payer: Self-pay | Admitting: Hematology

## 2017-07-12 ENCOUNTER — Telehealth: Payer: Self-pay | Admitting: Hematology

## 2017-07-12 VITALS — BP 129/78 | HR 69 | Temp 98.9°F | Resp 18 | Ht 60.25 in | Wt 177.7 lb

## 2017-07-12 DIAGNOSIS — D696 Thrombocytopenia, unspecified: Secondary | ICD-10-CM | POA: Diagnosis present

## 2017-07-12 DIAGNOSIS — N920 Excessive and frequent menstruation with regular cycle: Secondary | ICD-10-CM

## 2017-07-12 DIAGNOSIS — F17290 Nicotine dependence, other tobacco product, uncomplicated: Secondary | ICD-10-CM

## 2017-07-12 DIAGNOSIS — N946 Dysmenorrhea, unspecified: Secondary | ICD-10-CM

## 2017-07-12 DIAGNOSIS — D259 Leiomyoma of uterus, unspecified: Secondary | ICD-10-CM | POA: Diagnosis not present

## 2017-07-12 LAB — CBC WITH DIFFERENTIAL/PLATELET
BASOS ABS: 0 10*3/uL (ref 0.0–0.1)
Basophils Relative: 0 %
EOS ABS: 0.2 10*3/uL (ref 0.0–0.5)
Eosinophils Relative: 2 %
HCT: 45 % (ref 34.8–46.6)
Hemoglobin: 14.9 g/dL (ref 11.6–15.9)
Lymphocytes Relative: 34 %
Lymphs Abs: 2.5 10*3/uL (ref 0.9–3.3)
MCH: 29.7 pg (ref 25.1–34.0)
MCHC: 33.1 g/dL (ref 31.5–36.0)
MCV: 89.8 fL (ref 79.5–101.0)
MONO ABS: 0.4 10*3/uL (ref 0.1–0.9)
Monocytes Relative: 5 %
Neutro Abs: 4.3 10*3/uL (ref 1.5–6.5)
Neutrophils Relative %: 59 %
PLATELETS: 87 10*3/uL — AB (ref 145–400)
RBC: 5.01 MIL/uL (ref 3.70–5.45)
RDW: 14.5 % (ref 11.2–14.5)
WBC: 7.3 10*3/uL (ref 3.9–10.3)

## 2017-07-12 LAB — COMPREHENSIVE METABOLIC PANEL
ALBUMIN: 4.4 g/dL (ref 3.5–5.0)
ALK PHOS: 41 U/L (ref 40–150)
ALT: 28 U/L (ref 0–55)
AST: 13 U/L (ref 5–34)
Anion gap: 10 (ref 3–11)
BILIRUBIN TOTAL: 0.3 mg/dL (ref 0.2–1.2)
BUN: 8 mg/dL (ref 7–26)
CO2: 19 mmol/L — ABNORMAL LOW (ref 22–29)
CREATININE: 0.83 mg/dL (ref 0.60–1.10)
Calcium: 10.3 mg/dL (ref 8.4–10.4)
Chloride: 108 mmol/L (ref 98–109)
GFR calc Af Amer: >60 mL/min (ref >60)
GLUCOSE: 139 mg/dL (ref 70–140)
Potassium: 3.5 mmol/L (ref 3.5–5.1)
SODIUM: 137 mmol/L (ref 136–145)
TOTAL PROTEIN: 8 g/dL (ref 6.4–8.3)

## 2017-07-12 LAB — RETICULOCYTES
RBC.: 5.01 MIL/uL (ref 3.70–5.45)
RETIC COUNT ABSOLUTE: 70.1 10*3/uL (ref 33.7–90.7)
RETIC CT PCT: 1.4 % (ref 0.7–2.1)

## 2017-07-12 NOTE — Telephone Encounter (Signed)
Appointments scheduled AVS/Calendar printed per 5/9 los °

## 2017-07-12 NOTE — Patient Instructions (Signed)
Thank you for choosing [facility] to provide your oncology and hematology care.  To afford each patient quality time with our providers, please arrive 30 minutes before your scheduled appointment time.  If you arrive late for your appointment, you may be asked to reschedule.  We strive to give you quality time with our providers, and arriving late affects you and other patients whose appointments are after yours.   If you are a no show for multiple scheduled visits, you may be dismissed from the clinic at the providers discretion.    Again, thank you for choosing [facility], our hope is that these requests will decrease the amount of time that you wait before being seen by our physicians.  ______________________________________________________________________  Should you have questions after your visit to the [facility], please contact our office at [phone] between the hours of 8:30 and 4:30 p.m.    Voicemails left after 4:30p.m will not be returned until the following business day.    For prescription refill requests, please have your pharmacy contact us directly.  Please also try to allow 48 hours for prescription requests.    Please contact the scheduling department for questions regarding scheduling.  For scheduling of procedures such as PET scans, CT scans, MRI, Ultrasound, etc please contact central scheduling at [phone].    Resources For Cancer Patients and Caregivers:   Oncolink.org:  A wonderful resource for patients and healthcare providers for information regarding your disease, ways to tract your treatment, what to expect, etc.     American Cancer Society:  800-227-2345  Can help patients locate various types of support and financial assistance  Cancer Care: 1-800-813-HOPE (4673) Provides financial assistance, online support groups, medication/co-pay assistance.    [place] DSS:  336-641-3447 Where to apply for food  stamps, Medicaid, and utility assistance  Medicare Rights Center: 800-333-4114 Helps people with Medicare understand their rights and benefits, navigate the Medicare system, and secure the quality healthcare they deserve  SCAT: 336-333-6589 [place] [employer]'s shared-ride transportation service for eligible riders who have a disability that prevents them from riding the fixed route bus.    For additional information on assistance programs please contact our social worker:   Grier Hock/Abigail Elmore:  [phone]            

## 2017-07-13 ENCOUNTER — Ambulatory Visit: Payer: PRIVATE HEALTH INSURANCE

## 2017-07-16 ENCOUNTER — Other Ambulatory Visit: Payer: Self-pay

## 2017-07-16 ENCOUNTER — Ambulatory Visit (INDEPENDENT_AMBULATORY_CARE_PROVIDER_SITE_OTHER): Payer: PRIVATE HEALTH INSURANCE

## 2017-07-16 VITALS — BP 136/82 | HR 64 | Ht 60.25 in | Wt 180.0 lb

## 2017-07-16 DIAGNOSIS — N852 Hypertrophy of uterus: Secondary | ICD-10-CM | POA: Diagnosis not present

## 2017-07-16 MED ORDER — LEUPROLIDE ACETATE 3.75 MG IM KIT
3.7500 mg | PACK | Freq: Once | INTRAMUSCULAR | Status: AC
  Administered 2017-07-16: 3.75 mg via INTRAMUSCULAR

## 2017-07-16 NOTE — Progress Notes (Signed)
Patient here today for 4th Depo Lupron 3.75mg  injection. This was given in [place] and patient tolerated injection well.   Indication for injection: firoid uterus and anemia  LMP patient states has continuous spotting  Contraception: Tubal

## 2017-07-17 ENCOUNTER — Telehealth: Payer: Self-pay | Admitting: *Deleted

## 2017-07-17 NOTE — Telephone Encounter (Signed)
Call from patient. States she has been cleared for surgery by hematology.  Surgery instruction sheet reviewed and appointments scheduled. Will provide printed copy at surgery consult appointment on 07-23-17.  Routing to provider for final review. Patient agreeable to disposition. Will close encounter.

## 2017-07-17 NOTE — Patient Instructions (Addendum)
Your procedure is scheduled on: Tuesday, May 28   Enter through the Main Entrance of [facility] at: 6 am  Pick up the phone at the desk and dial [phone].  Call this number if you have problems the morning of surgery: [phone].  Remember: Do NOT eat food or Do NOT drink clear liquids (including water) after midnight Monday  Take these medicines the morning of surgery with a SIP OF WATER: Klonopin, Norethindrone. May take Sumatriptan if needed. Flonase spray if needed  Dorzolamide eye drops, Atropine eye drops Alphagan eye drops  Do Not smoke on the day of surgery.  Stop herbal medications and supplements at this time.  Do NOT wear jewelry (body piercing), metal hair clips/bobby pins, make-up, or nail polish. Do NOT wear lotions, powders, or perfumes.  You may wear deoderant. Do NOT shave for 48 hours prior to surgery. Do NOT bring valuables to the hospital. Contacts, dentures, or bridgework may not be worn into surgery.  Leave suitcase in car.  After surgery it may be brought to your room.    For patients admitted to the hospital, checkout time is 11:00 AM the day of discharge.

## 2017-07-17 NOTE — Telephone Encounter (Signed)
Call to patient to review surgery instructions. Left message to call back.

## 2017-07-20 ENCOUNTER — Ambulatory Visit: Payer: PRIVATE HEALTH INSURANCE | Admitting: Hematology

## 2017-07-20 ENCOUNTER — Other Ambulatory Visit: Payer: Self-pay | Admitting: Obstetrics & Gynecology

## 2017-07-21 NOTE — Progress Notes (Signed)
[name]  HEMATOLOGY ONCOLOGY PROGRESS NOTE (Duplicate due to filing without attestation)  Date of service: .07/12/2017  Patient Care Team: Damaris Hippo, MD as PCP - General (Family Medicine) Patient Care Team: Damaris Hippo, MD as PCP - General (Family Medicine)  CHIEF COMPLAINTS/PURPOSE OF CONSULTATION:  Thrombocytopenia.  HISTORY OF PRESENTING ILLNESS:   Angel Rodriguez is a wonderful 42 y.o. female who has been referred to [place] by Dr .Damaris Hippo, MD  for evaluation and management of thrombocytopenia.  Patient has a history of obesity, anxiety, menorrhagia related to urinary tract infection as well as had a history of mild thrombocytopenia for at least 1-2 years. She was noted to have a platelet count of 99k in March 2016 with a normal hemoglobin and WBC count. She recently had routine labs with her primary care physician on 02/16/2016 and was noted to have a platelet count of 78k with a hemoglobin of 13.1 and a WBC count of 7.5k.  Patient notes some easy bruisability but no overt bleeding. No nosebleeds no, bleeds no GI bleeding or hematuria. Does tend to have menorrhagia related to uterine fibroids but notes that this is much improved since her endometrial ablation.  No fever no chills no night sweats. Has been gaining some weight but is trying to work on this. Has had issues with intermittent cluster headaches for which she has been on a few prednisone bursts.  She does use NSAIDs on an off for dysmenorrhea . Denies any other significant new medications. Has previously had some issues with excessive alcohol use but notes that she has been drinking only a couple of drinks a week recently. Smoking about 5 cigarettes a week. Denies any other drug abuse.  INTERVAL HISTORY:   Angel Rodriguez returns today regarding her thrombocytopenia. The patient's last visit with [place] was on 08/31/16. The pt reports that she is doing well overall. The pt will be having a total laparoscopic  hysterectomy with salpingectomy on 07/31/17 with Dr Hale Bogus.   The pt reports painful, heavy periods. She continues taking her vitamins and B12 replacement, and avoids NSAIDs since her last visit with [place].   Of note since the patient's last visit, pt has had US Pelvic completed on 06/22/17 with results revealing 1. Multiple uterine fibroids enlarging the uterus. Largest, anterior fibroid is probably stable. 2. No adnexal mass. 3. Nonvisualized ovaries.  Lab results today (07/12/17) of CBC, CMP, and Reticulocytes is as follows: all values are WNL except for PLT at 87k and CO2 at 19.  On review of systems, pt reports heavy periods, and denies any other symptoms.    REVIEW OF SYSTEMS:    10 Point review of systems of done and is negative except as noted above.  . Past Medical History:  Diagnosis Date  . Abnormal Pap smear of cervix   . Anxiety   . Blind right eye    swelling & pressure  . BV (bacterial vaginosis)   . Cluster headache   . Depression   . Dysmenorrhea   . Eye disorder   . Fibroids   . History of pneumonia   . History of trichomoniasis   . Menorrhagia   . Migraines   . Seizure ([place])     . Past Surgical History:  Procedure Laterality Date  . ABLATION    . COLPOSCOPY    . EYE SURGERY    . HYSTEROSCOPY W/D&C    . TUBAL LIGATION    . WISDOM TOOTH EXTRACTION      .  Social History   Tobacco Use  . Smoking status: Current Some Day Smoker    Types: E-cigarettes  . Smokeless tobacco: Never Used  Substance Use Topics  . Alcohol use: Yes    Comment: occasional  . Drug use: No    ALLERGIES:  is allergic to ivp dye [iodinated diagnostic agents] and bee venom.  MEDICATIONS:  Current Outpatient Medications  Medication Sig Dispense Refill  . acetaminophen (TYLENOL) 500 MG tablet Take 500 mg by mouth every 6 (six) hours as needed for mild pain or headache.    [name] acetaminophen-codeine (TYLENOL #3) 300-30 MG tablet One to two po qd 60 tablet 3  .  atorvastatin (LIPITOR) 40 MG tablet Take 40 mg by mouth daily at 6 PM.     . atropine 1 % ophthalmic solution Place 1 drop into the right eye 3 (three) times daily.     . brimonidine (ALPHAGAN P) 0.1 % SOLN Place 1 drop into the right eye every 8 (eight) hours.     . busPIRone (BUSPAR) 15 MG tablet Take 1 tablet (15 mg total) by mouth 2 (two) times daily. (Patient taking differently: Take 15 mg by mouth 2 (two) times daily as needed. ) 180 tablet 3  . clonazePAM (KLONOPIN) 0.5 MG tablet Take 1 tablet (0.5 mg total) by mouth 2 (two) times daily as needed. for anxiety 180 tablet 1  . Cyanocobalamin (VITAMIN B-12 PO) Take 1 tablet by mouth daily.     . dorzolamide-timolol (COSOPT) 22.3-6.8 MG/ML ophthalmic solution Place 1 drop into the right eye 2 (two) times daily.     . fluticasone (FLONASE) 50 MCG/ACT nasal spray Place 1-2 sprays into both nostrils daily.    [name] latanoprost (XALATAN) 0.005 % ophthalmic solution Place 1 drop into both eyes at bedtime.    [name] LUPRON DEPOT, 25-MONTH, 3.75 MG injection     . moxifloxacin (VIGAMOX) 0.5 % ophthalmic solution Vigamox 0.5 % eye drops  INSTILL 1 DROP INTO AFFECTED EYE(S) BY OPHTHALMIC ROUTE 3 TIMES PER DAY    . Multiple Vitamin (MULTI-VITAMINS) TABS Take 1 tablet by mouth daily.     . norethindrone (AYGESTIN) 5 MG tablet Take 1 tablet (5 mg total) by mouth 2 (two) times daily. Once bleeding stops continue 1 po daily. 60 tablet 4  . SUMAtriptan (IMITREX) 100 MG tablet Take 1 tablet (100 mg total) by mouth once as needed for migraine. May repeat in 2 hours if headache persists or recurs. 18 tablet 3  . traMADol (ULTRAM) 50 MG tablet Take 1 tablet (50 mg total) by mouth every 6 (six) hours as needed. Can take 2 tablets every 6 hours for a max dose of 400mg /day, if needed. 40 tablet 0  . verapamil (CALAN-SR) 240 MG CR tablet Take 1 tablet (240 mg total) by mouth at bedtime. 90 tablet 3   No current facility-administered medications for this visit.     Facility-Administered Medications Ordered in Other Visits  Medication Dose Route Frequency Provider Last Rate Last Dose  . gadopentetate dimeglumine (MAGNEVIST) injection 16 mL  16 mL Intravenous Once PRN Sater, Nanine Means, MD        PHYSICAL EXAMINATION: ECOG PERFORMANCE STATUS: 0 - Asymptomatic  . Vitals:   07/12/17 0959  BP: 129/78  Pulse: 69  Resp: 18  Temp: 98.9 F (37.2 C)  SpO2: 95%    Filed Weights   07/12/17 0959  Weight: 177 lb 11.2 oz (80.6 kg)   .Body mass index is 34.42 kg/m.  GENERAL:alert, in no acute distress and comfortable SKIN: no acute rashes, no significant lesions EYES: conjunctiva are pink and non-injected, sclera anicteric OROPHARYNX: MMM, no exudates, no oropharyngeal erythema or ulceration NECK: supple, no JVD LYMPH:  no palpable lymphadenopathy in the cervical, axillary or inguinal regions LUNGS: clear to auscultation b/l with normal respiratory effort HEART: regular rate & rhythm ABDOMEN:  normoactive bowel sounds , non tender, not distended. Extremity: no pedal edema PSYCH: alert & oriented x 3 with fluent speech NEURO: no focal motor/sensory deficits  LABORATORY DATA:   I have reviewed the data as listed  . CBC Latest Ref Rng & Units 07/12/2017 03/29/2017 08/31/2016  WBC 3.9 - 10.3 K/uL 7.3 9.6 6.4  Hemoglobin 11.6 - 15.9 g/dL 14.9 8.2(L) 13.7  Hematocrit 34.8 - 46.6 % 45.0 24.1(L) 40.2  Platelets 145 - 400 K/uL 87(L) 76(L) 71(L)    . CMP Latest Ref Rng & Units 07/12/2017 08/31/2016 04/19/2016  Glucose 70 - 140 mg/dL 139 106 111  BUN 7 - 26 mg/dL 8 8.6 7.0  Creatinine 0.60 - 1.10 mg/dL 0.83 0.8 0.7  Sodium 136 - 145 mmol/L 137 138 138  Potassium 3.5 - 5.1 mmol/L 3.5 3.9 3.9  Chloride 98 - 109 mmol/L 108 - -  CO2 22 - 29 mmol/L 19(L) 22 21(L)  Calcium 8.4 - 10.4 mg/dL 10.3 10.3 9.9  Total Protein 6.4 - 8.3 g/dL 8.0 7.6 7.8  Total Bilirubin 0.2 - 1.2 mg/dL 0.3 0.43 0.46  Alkaline Phos 40 - 150 U/L 41 62 61  AST 5 - 34 U/L 13 18 15    ALT 0 - 55 U/L 28 23 19      RADIOGRAPHIC STUDIES: I have personally reviewed the radiological images as listed and agreed with the findings in the report. US Pelvic Complete With Transvaginal  Result Date: 06/22/2017 CLINICAL DATA:  Heavy bleeding for 6 months. History of bilateral tubal ligation and fibroids. Patient receiving Depo Lupron injections. EXAM: TRANSABDOMINAL AND TRANSVAGINAL ULTRASOUND OF PELVIS TECHNIQUE: Both transabdominal and transvaginal ultrasound examinations of the pelvis were performed. Transabdominal technique was performed for global imaging of the pelvis including uterus, ovaries, adnexal regions, and pelvic cul-de-sac. It was necessary to proceed with endovaginal exam following the transabdominal exam to visualize the adnexal regions. COMPARISON:  03/29/2017 FINDINGS: Uterus Measurements: Uterus is enlarged and difficult to measure but is estimated to be at least 21.6 x 12.7 x 14.8 centimeters. Multiple fibroids are present. Large anterior fibroid is 12.4 x 12.7 x 5.3 centimeters. RIGHT fundal fibroid is 8.8 x 7.3 x 7.5 centimeters. RIGHT mid uterine fibroid is 6.5 x 5.4 x 5.8 centimeters. LOWER uterine segment fibroid is 2.9 x 2.6 x 2.8 centimeters. Endometrium Thickness: Endometrium is difficult to evaluate and displaced by multiple fibroids but is estimated to measure 11.8 millimeters. No focal abnormality visualized. Right ovary Measurements: The ovary is not visualized, either absent or obscured. No adnexal mass identified. Left ovary Measurements: The ovary is not visualized, either absent or obscured. No adnexal mass identified. Other findings No abnormal free fluid. IMPRESSION: 1. Multiple uterine fibroids enlarging the uterus. Largest, anterior fibroid is probably stable. 2. No adnexal mass. 3. Nonvisualized ovaries. Electronically Signed   By: Nolon Nations M.D.   On: 06/22/2017 11:17    ASSESSMENT & PLAN:    42 year old female with  #1 Mild-Moderate  thrombocytopenia.  No associated anemia. Borderline elevation of WBC count which could be due to steroid use. Her platelets were about 99k in March 2016. 78k in December  2017 and was previously down to 71k. Platelets are higher today at 87k. No issues with overt bleeding. LDH within normal limits. No symptoms suggestive of lymphoproliferative process. Peripheral blood smear showed no platelet clumping few large platelets. Some decrease in platelets could be due to increased consumption from her heavy periods. Patients age and Absence of associated anemia or leukopenia suggest against a broader bone marrow process. 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  Component     Latest Ref Rng & Units 04/19/2016  LDH     125 - 245 U/L 171  Hep C Virus Ab     0.0 - 0.9 s/co ratio <0.1  HIV Screen 4th Generation wRfx     Non Reactive Non Reactive   PLAN -Discussed pt labwork today, 07/12/17; PLT at 87k -PLT at 87k are adequate for the upcoming total laparoscopic hysterectomy with salpingectomy on 07/31/17 with Dr Hale Bogus.  on 07/31/17.  -If surgeon feels PLT are too low, they may give pt PLT immediately before surgery. If any concerns for bleeding during or prior to surgery, if surgeons preference, could consider one aphresed unit of platelets.  -Avoid IV or oral NSAIDs, post-operative pain management might want to use tramadol or narcotics as per surgeon, given the pt's complex hx of pain management with chronic headaches, would recommend that pt follow up PCP or neurologist for longer-term pain management.  -Strongly encouraged pt to establish care with a PCP -Will see pt back in 3-4 months for PLT check post surgery.   -Recommended holding off on alcohol use. -No indication for treatment of her thrombocytopenia this time even if this were ITP. -Reasonable to take a daily B complex to address any other possible vitamin B deficiencies. -Follow-up with primary care physician for  management of other medical comorbidities  RTC with Dr Irene Limbo in 4 months with labs    The total time spent in the appointment was 20 minutes and more than 50% was on counseling and direct patient cares.    Sullivan Lone MD [place] AAHIVMS [facility] Hematology/Oncology Physician [facility]  (Office):       [phone] (Work cell):  [phone] (Fax):           [phone]

## 2017-07-23 ENCOUNTER — Ambulatory Visit (INDEPENDENT_AMBULATORY_CARE_PROVIDER_SITE_OTHER): Payer: PRIVATE HEALTH INSURANCE | Admitting: Obstetrics & Gynecology

## 2017-07-23 ENCOUNTER — Encounter: Payer: Self-pay | Admitting: Obstetrics & Gynecology

## 2017-07-23 VITALS — BP 140/82 | HR 88 | Resp 16 | Ht 60.25 in | Wt 175.6 lb

## 2017-07-23 DIAGNOSIS — D219 Benign neoplasm of connective and other soft tissue, unspecified: Secondary | ICD-10-CM

## 2017-07-23 DIAGNOSIS — N852 Hypertrophy of uterus: Secondary | ICD-10-CM

## 2017-07-23 DIAGNOSIS — D500 Iron deficiency anemia secondary to blood loss (chronic): Secondary | ICD-10-CM

## 2017-07-23 NOTE — Progress Notes (Signed)
42 y.o. [id number] Single Native American female here for discussion of upcoming procedure.  She is accompanied by her mother.  Total Laparoscopic Hysterectomy with Salpingectomy, possible BSO, Cystoscopy is planned for 07/31/17 planned due to enlarged fibroid uterus.  She is also going to be consented for possible TAH.  Reviewed this with pt who is in agreement.    Procedure discussed with patient.  Hospital stay, recovery and pain management all discussed.  Risks discussed including but not limited to bleeding, 1% risk of receiving a  transfusion, infection, 3-4% risk of bowel/bladder/ureteral/vascular injury discussed as well as possible need for additional surgery if injury does occur discussed.  DVT/PE and rare risk of death discussed.  My actual complications with prior surgeries discussed.  Vaginal cuff dehiscence discussed.  Hernia formation discussed.  Positioning and incision locations discussed.  Patient aware if pathology abnormal she may need additional treatment.  All questions answered.    She has been on Depo Lupron for 4 months and her anemia has resolved.  Surgical clearance was obtained from hematology (Dr. Irene Limbo) and I did communicate with neurology (Dr. Felecia Shelling) who did not feel she needed a formal consultation.     Port site locations, positioning, length of surgery also reviewed.  Last ultrasound on 06/22/17 showed uterus measuring 21 x 13 x 15cm with multiple fibroids.  Anterior 13cm, right fundal 9cm, right mid uterine fibroid 6.5cm and lower uterine fibroid measuring 3cm noted.    She is currently not bleeding.    Ob Hx:   No LMP recorded. Patient has had an injection.          Sexually active: Yes.   Birth control: bilateral tubal ligation Last pap: 03/29/17 Neg. HR HPV:neg  Last MMG: 01/24/16 BIRADS2:Benign  Tobacco: No  Past Surgical History:  Procedure Laterality Date  . ABLATION    . COLPOSCOPY    . EYE SURGERY    . HYSTEROSCOPY W/D&C    . TUBAL LIGATION    . WISDOM  TOOTH EXTRACTION      Past Medical History:  Diagnosis Date  . Abnormal Pap smear of cervix   . Anxiety   . Blind right eye    swelling & pressure  . BV (bacterial vaginosis)   . Cluster headache   . Depression   . Dysmenorrhea   . Eye disorder   . Fibroids   . History of pneumonia   . History of trichomoniasis   . Menorrhagia   . Migraines   . Seizure ([place])     Allergies: Ivp dye [iodinated diagnostic agents] and Bee venom  Current Outpatient Medications  Medication Sig Dispense Refill  . acetaminophen (TYLENOL) 500 MG tablet Take 500 mg by mouth every 6 (six) hours as needed for mild pain or headache.    [name] acetaminophen-codeine (TYLENOL #3) 300-30 MG tablet One to two po qd 60 tablet 3  . atorvastatin (LIPITOR) 40 MG tablet Take 40 mg by mouth daily at 6 PM.     . atropine 1 % ophthalmic solution Place 1 drop into the right eye 3 (three) times daily.     . brimonidine (ALPHAGAN P) 0.1 % SOLN Place 1 drop into the right eye every 8 (eight) hours.     . clonazePAM (KLONOPIN) 0.5 MG tablet Take 1 tablet (0.5 mg total) by mouth 2 (two) times daily as needed. for anxiety 180 tablet 1  . Cyanocobalamin (VITAMIN B-12 PO) Take 1 tablet by mouth daily.     . dorzolamide-timolol (  COSOPT) 22.3-6.8 MG/ML ophthalmic solution Place 1 drop into the right eye 2 (two) times daily.     . fluticasone (FLONASE) 50 MCG/ACT nasal spray Place 1-2 sprays into both nostrils daily.    [name] latanoprost (XALATAN) 0.005 % ophthalmic solution Place 1 drop into both eyes at bedtime.    . moxifloxacin (VIGAMOX) 0.5 % ophthalmic solution Vigamox 0.5 % eye drops  INSTILL 1 DROP INTO AFFECTED EYE(S) BY OPHTHALMIC ROUTE 3 TIMES PER DAY    . Multiple Vitamin (MULTI-VITAMINS) TABS Take 1 tablet by mouth daily.     . norethindrone (AYGESTIN) 5 MG tablet Take 1 tablet (5 mg total) by mouth 2 (two) times daily. Once bleeding stops continue 1 po daily. 60 tablet 4  . SUMAtriptan (IMITREX) 100 MG tablet Take 1 tablet  (100 mg total) by mouth once as needed for migraine. May repeat in 2 hours if headache persists or recurs. 18 tablet 3  . verapamil (CALAN-SR) 240 MG CR tablet Take 1 tablet (240 mg total) by mouth at bedtime. 90 tablet 3  . traMADol (ULTRAM) 50 MG tablet Take 1 tablet (50 mg total) by mouth every 6 (six) hours as needed. Can take 2 tablets every 6 hours for a max dose of 400mg /day, if needed. (Patient not taking: Reported on 07/23/2017) 40 tablet 0   No current facility-administered medications for this visit.    Facility-Administered Medications Ordered in Other Visits  Medication Dose Route Frequency Provider Last Rate Last Dose  . gadopentetate dimeglumine (MAGNEVIST) injection 16 mL  16 mL Intravenous Once PRN Sater, Nanine Means, MD        ROS: A comprehensive review of systems was negative except for: Behavioral/Psych: positive for anxiety  Exam:    BP 140/82 (BP Location: Right Arm, Patient Position: Sitting, Cuff Size: Normal)   Pulse 88   Resp 16   Ht 5' 0.25" (1.53 m)   Wt 175 lb 9.6 oz (79.7 kg)   BMI 34.01 kg/m   General appearance: alert and cooperative Head: Normocephalic, without obvious abnormality, atraumatic Neck: no adenopathy, supple, symmetrical, trachea midline and thyroid not enlarged, symmetric, no tenderness/mass/nodules Lungs: clear to auscultation bilaterally Heart: regular rate and rhythm, S1, S2 normal, no murmur, click, rub or gallop Abdomen: soft, non-tender; bowel sounds normal; no masses,  no organomegaly Extremities: extremities normal, atraumatic, no cyanosis or edema Skin: Skin color, texture, turgor normal. No rashes or lesions Lymph nodes: Cervical, supraclavicular, and axillary nodes normal. no inguinal nodes palpated Neurologic: Grossly normal  Pelvic: External genitalia:  no lesions              Urethra: normal appearing urethra with no masses, tenderness or lesions              Bartholins and Skenes: normal                 Vagina: normal  appearing vagina with normal color and discharge, no lesions              Cervix: normal appearance              Pap taken: No.        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 Irene Limbo, hematology Hysterectomy brochure given for pre and post op instructions.

## 2017-07-25 ENCOUNTER — Encounter (HOSPITAL_COMMUNITY): Payer: Self-pay

## 2017-07-25 ENCOUNTER — Encounter (HOSPITAL_COMMUNITY)
Admission: RE | Admit: 2017-07-25 | Discharge: 2017-07-25 | Disposition: A | Discharge status: Home / Self Care | Payer: PRIVATE HEALTH INSURANCE | Source: Ambulatory Visit | Attending: Obstetrics & Gynecology | Admitting: Obstetrics & Gynecology

## 2017-07-25 ENCOUNTER — Other Ambulatory Visit: Payer: Self-pay | Admitting: Obstetrics & Gynecology

## 2017-07-25 ENCOUNTER — Other Ambulatory Visit: Payer: Self-pay

## 2017-07-25 DIAGNOSIS — Z01812 Encounter for preprocedural laboratory examination: Secondary | ICD-10-CM | POA: Insufficient documentation

## 2017-07-25 HISTORY — DX: Gastro-esophageal reflux disease without esophagitis: K21.9

## 2017-07-25 HISTORY — DX: Cluster headache syndrome, unspecified, not intractable: G44.009

## 2017-07-25 HISTORY — DX: Unspecified convulsions: R56.9

## 2017-07-25 LAB — CBC
HCT: 43 % (ref 36.0–46.0)
HEMOGLOBIN: 14.2 g/dL (ref 12.0–15.0)
MCH: 29.6 pg (ref 26.0–34.0)
MCHC: 33 g/dL (ref 30.0–36.0)
MCV: 89.6 fL (ref 78.0–100.0)
Platelets: 105 10*3/uL — ABNORMAL LOW (ref 150–400)
RBC: 4.8 MIL/uL (ref 3.87–5.11)
RDW: 14.6 % (ref 11.5–15.5)
WBC: 6.4 10*3/uL (ref 4.0–10.5)

## 2017-07-25 LAB — COMPREHENSIVE METABOLIC PANEL
ALK PHOS: 33 U/L — AB (ref 38–126)
ALT: 37 U/L (ref 14–54)
ANION GAP: 8 (ref 5–15)
AST: 30 U/L (ref 15–41)
Albumin: 4.1 g/dL (ref 3.5–5.0)
BILIRUBIN TOTAL: 0.2 mg/dL — AB (ref 0.3–1.2)
BUN: 6 mg/dL (ref 6–20)
CALCIUM: 10 mg/dL (ref 8.9–10.3)
CO2: 24 mmol/L (ref 22–32)
CREATININE: 0.68 mg/dL (ref 0.44–1.00)
Chloride: 104 mmol/L (ref 101–111)
GFR calc Af Amer: >60 mL/min (ref >60)
Glucose, Bld: 103 mg/dL — ABNORMAL HIGH (ref 65–99)
Potassium: 3.9 mmol/L (ref 3.5–5.1)
Sodium: 136 mmol/L (ref 135–145)
TOTAL PROTEIN: 7.4 g/dL (ref 6.5–8.1)

## 2017-07-25 LAB — TYPE AND SCREEN
ABO/RH(D): A POS
ANTIBODY SCREEN: NEGATIVE

## 2017-07-25 NOTE — Telephone Encounter (Signed)
Medication refill request: OCP ( Last AEX:  03-29-17  Next AEX: not scheduled (patient scheduled got [place] 07-31-17)  Last MMG (if hormonal medication request): none on file  Refill authorized: please advise

## 2017-07-25 NOTE — Pre-Procedure Instructions (Signed)
Patient's platelet  count at pre op was 105. Dr. Ola Spurr aware. Instructed me to call Dr. Sabra Heck. Talked to Gay Filler at Dr. Sanjuan Dame office and dr. Sabra Heck is aware of low platelet count and has been in touch with oncologist.

## 2017-07-25 NOTE — Telephone Encounter (Signed)
Tammy called from [facility]. Patient pre-op labs show platelet count of 105K/uL. ( this is up from previous count of 87K/uL.)

## 2017-07-26 ENCOUNTER — Encounter: Payer: Self-pay | Admitting: Obstetrics & Gynecology

## 2017-07-30 NOTE — Anesthesia Preprocedure Evaluation (Addendum)
Anesthesia Evaluation  Patient identified by MRN, date of birth, ID band Patient awake    Reviewed: Allergy & Precautions, NPO status , Patient's Chart, lab work & pertinent test results  Airway Mallampati: III  TM Distance: >3 FB Neck ROM: Full    Dental no notable dental hx.    Pulmonary Current Smoker,    Pulmonary exam normal breath sounds clear to auscultation       Cardiovascular negative cardio ROS Normal cardiovascular exam Rhythm:Regular Rate:Normal     Neuro/Psych  Headaches, Seizures -,  PSYCHIATRIC DISORDERS Anxiety Depression Blind right eye Pre-op communication with neurology (Dr. Felecia Shelling)        GI/Hepatic Neg liver ROS, GERD  ,  Endo/Other  negative endocrine ROS  Renal/GU negative Renal ROS     Musculoskeletal negative musculoskeletal ROS (+)   Abdominal (+) + obese,   Peds  Hematology Thrombocytopenia  Surgical clearance was obtained from hematology (Dr. Irene Limbo)       Anesthesia Other Findings Irregular uterine bleeding, fibroid uterus unresponsive to Lupron, anemia  Reproductive/Obstetrics hcg negative                           Anesthesia Physical Anesthesia Plan  ASA: III  Anesthesia Plan: General   Post-op Pain Management:    Induction: Intravenous  PONV Risk Score and Plan: 2 and Ondansetron, Dexamethasone, Midazolam and Treatment may vary due to age or medical condition  Airway Management Planned: Oral ETT  Additional Equipment:   Intra-op Plan:   Post-operative Plan: Extubation in OR  Informed Consent: I have reviewed the patients History and Physical, chart, labs and discussed the procedure including the risks, benefits and alternatives for the proposed anesthesia with the patient or authorized representative who has indicated his/her understanding and acceptance.   Dental advisory given  Plan Discussed with: CRNA  Anesthesia Plan Comments:  (Potential post operative facial swelling and visual changes discussed)       Anesthesia Quick Evaluation

## 2017-07-31 ENCOUNTER — Ambulatory Visit (HOSPITAL_COMMUNITY)
Admission: AD | Admit: 2017-07-31 | Discharge: 2017-08-01 | Disposition: A | Discharge status: Home / Self Care | Payer: PRIVATE HEALTH INSURANCE | Source: Ambulatory Visit | Attending: Obstetrics & Gynecology | Admitting: Obstetrics & Gynecology

## 2017-07-31 ENCOUNTER — Encounter: Payer: Self-pay | Admitting: Obstetrics & Gynecology

## 2017-07-31 ENCOUNTER — Encounter (HOSPITAL_COMMUNITY)
Admission: AD | Disposition: A | Discharge status: Home / Self Care | Payer: Self-pay | Source: Ambulatory Visit | Attending: Obstetrics & Gynecology

## 2017-07-31 ENCOUNTER — Other Ambulatory Visit: Payer: Self-pay

## 2017-07-31 ENCOUNTER — Encounter (HOSPITAL_COMMUNITY): Payer: Self-pay

## 2017-07-31 ENCOUNTER — Ambulatory Visit (HOSPITAL_COMMUNITY): Payer: PRIVATE HEALTH INSURANCE | Admitting: Anesthesiology

## 2017-07-31 DIAGNOSIS — Z91041 Radiographic dye allergy status: Secondary | ICD-10-CM | POA: Insufficient documentation

## 2017-07-31 DIAGNOSIS — Z79899 Other long term (current) drug therapy: Secondary | ICD-10-CM | POA: Insufficient documentation

## 2017-07-31 DIAGNOSIS — D259 Leiomyoma of uterus, unspecified: Secondary | ICD-10-CM | POA: Diagnosis present

## 2017-07-31 DIAGNOSIS — F419 Anxiety disorder, unspecified: Secondary | ICD-10-CM | POA: Diagnosis not present

## 2017-07-31 DIAGNOSIS — N888 Other specified noninflammatory disorders of cervix uteri: Secondary | ICD-10-CM | POA: Diagnosis not present

## 2017-07-31 DIAGNOSIS — N800 Endometriosis of uterus: Secondary | ICD-10-CM | POA: Diagnosis not present

## 2017-07-31 DIAGNOSIS — Z793 Long term (current) use of hormonal contraceptives: Secondary | ICD-10-CM | POA: Insufficient documentation

## 2017-07-31 DIAGNOSIS — F17210 Nicotine dependence, cigarettes, uncomplicated: Secondary | ICD-10-CM | POA: Diagnosis not present

## 2017-07-31 DIAGNOSIS — Z91030 Bee allergy status: Secondary | ICD-10-CM | POA: Diagnosis not present

## 2017-07-31 DIAGNOSIS — D251 Intramural leiomyoma of uterus: Secondary | ICD-10-CM | POA: Insufficient documentation

## 2017-07-31 DIAGNOSIS — N938 Other specified abnormal uterine and vaginal bleeding: Secondary | ICD-10-CM | POA: Insufficient documentation

## 2017-07-31 DIAGNOSIS — Z7951 Long term (current) use of inhaled steroids: Secondary | ICD-10-CM | POA: Diagnosis not present

## 2017-07-31 DIAGNOSIS — G44009 Cluster headache syndrome, unspecified, not intractable: Secondary | ICD-10-CM | POA: Diagnosis not present

## 2017-07-31 DIAGNOSIS — K219 Gastro-esophageal reflux disease without esophagitis: Secondary | ICD-10-CM | POA: Insufficient documentation

## 2017-07-31 DIAGNOSIS — H409 Unspecified glaucoma: Secondary | ICD-10-CM | POA: Insufficient documentation

## 2017-07-31 DIAGNOSIS — D509 Iron deficiency anemia, unspecified: Secondary | ICD-10-CM | POA: Diagnosis not present

## 2017-07-31 DIAGNOSIS — D696 Thrombocytopenia, unspecified: Secondary | ICD-10-CM | POA: Diagnosis not present

## 2017-07-31 DIAGNOSIS — N939 Abnormal uterine and vaginal bleeding, unspecified: Secondary | ICD-10-CM | POA: Diagnosis not present

## 2017-07-31 HISTORY — PX: CYSTOSCOPY: SHX5120

## 2017-07-31 HISTORY — PX: TOTAL LAPAROSCOPIC HYSTERECTOMY WITH SALPINGECTOMY: SHX6742

## 2017-07-31 LAB — PREGNANCY, URINE: Preg Test, Ur: NEGATIVE

## 2017-07-31 SURGERY — HYSTERECTOMY, TOTAL, LAPAROSCOPIC, WITH SALPINGECTOMY
Anesthesia: General | Site: Urethra

## 2017-07-31 MED ORDER — HYDROMORPHONE HCL 2 MG PO TABS
2.0000 mg | ORAL_TABLET | ORAL | Status: DC | PRN
  Administered 2017-07-31: 2 mg via ORAL
  Filled 2017-07-31: qty 1

## 2017-07-31 MED ORDER — LACTATED RINGERS IV SOLN
INTRAVENOUS | Status: DC
  Administered 2017-07-31: 125 mL/h via INTRAVENOUS
  Administered 2017-07-31 (×2): via INTRAVENOUS

## 2017-07-31 MED ORDER — EPHEDRINE 5 MG/ML INJ
INTRAVENOUS | Status: AC
  Filled 2017-07-31: qty 10

## 2017-07-31 MED ORDER — DEXAMETHASONE SODIUM PHOSPHATE 10 MG/ML IJ SOLN
INTRAMUSCULAR | Status: DC | PRN
  Administered 2017-07-31: 10 mg via INTRAVENOUS

## 2017-07-31 MED ORDER — ENOXAPARIN SODIUM 40 MG/0.4ML ~~LOC~~ SOLN
40.0000 mg | SUBCUTANEOUS | Status: DC
  Administered 2017-08-01: 40 mg via SUBCUTANEOUS
  Filled 2017-07-31: qty 0.4

## 2017-07-31 MED ORDER — OXYCODONE HCL 5 MG/5ML PO SOLN: 5.0000 mg | Freq: Once | ORAL | Status: DC | PRN

## 2017-07-31 MED ORDER — HYDROMORPHONE HCL 1 MG/ML IJ SOLN
0.2500 mg | INTRAMUSCULAR | Status: DC | PRN
  Administered 2017-07-31 (×2): 0.5 mg via INTRAVENOUS

## 2017-07-31 MED ORDER — SUGAMMADEX SODIUM 200 MG/2ML IV SOLN
INTRAVENOUS | Status: AC
  Filled 2017-07-31: qty 2

## 2017-07-31 MED ORDER — OXYCODONE HCL 5 MG PO TABS: 5.0000 mg | ORAL_TABLET | Freq: Once | ORAL | Status: DC | PRN

## 2017-07-31 MED ORDER — MIDAZOLAM HCL 2 MG/2ML IJ SOLN
INTRAMUSCULAR | Status: DC | PRN
  Administered 2017-07-31: 2 mg via INTRAVENOUS

## 2017-07-31 MED ORDER — PHENYLEPHRINE HCL 10 MG/ML IJ SOLN: INTRAMUSCULAR | Status: DC | PRN

## 2017-07-31 MED ORDER — DORZOLAMIDE HCL-TIMOLOL MAL 2-0.5 % OP SOLN
1.0000 [drp] | Freq: Two times a day (BID) | OPHTHALMIC | Status: DC
  Administered 2017-07-31 – 2017-08-01 (×2): 1 [drp] via OPHTHALMIC

## 2017-07-31 MED ORDER — DEXTROSE-NACL 5-0.45 % IV SOLN: INTRAVENOUS | Status: DC

## 2017-07-31 MED ORDER — LIDOCAINE HCL (CARDIAC) PF 100 MG/5ML IV SOSY
PREFILLED_SYRINGE | INTRAVENOUS | Status: DC | PRN
  Administered 2017-07-31: 60 mg via INTRAVENOUS

## 2017-07-31 MED ORDER — DEXAMETHASONE SODIUM PHOSPHATE 4 MG/ML IJ SOLN
INTRAMUSCULAR | Status: AC
Start: 2017-07-31 — End: ?
  Filled 2017-07-31: qty 1

## 2017-07-31 MED ORDER — HYDROMORPHONE HCL 1 MG/ML IJ SOLN: 0.5000 mg | INTRAMUSCULAR | Status: DC | PRN

## 2017-07-31 MED ORDER — STERILE WATER FOR IRRIGATION IR SOLN
Status: DC | PRN
  Administered 2017-07-31: 1000 mL

## 2017-07-31 MED ORDER — ATROPINE SULFATE 1 % OP SOLN
1.0000 [drp] | Freq: Three times a day (TID) | OPHTHALMIC | Status: DC
  Administered 2017-07-31: 21:00:00 via OPHTHALMIC
  Administered 2017-08-01 (×2): 1 [drp] via OPHTHALMIC

## 2017-07-31 MED ORDER — SODIUM CHLORIDE 0.9 % IR SOLN
Status: DC | PRN
  Administered 2017-07-31: 3000 mL

## 2017-07-31 MED ORDER — PROPOFOL 10 MG/ML IV BOLUS
INTRAVENOUS | Status: DC | PRN
  Administered 2017-07-31: 180 mg via INTRAVENOUS

## 2017-07-31 MED ORDER — LATANOPROST 0.005 % OP SOLN
1.0000 [drp] | Freq: Every day | OPHTHALMIC | Status: DC
  Administered 2017-07-31: 1 [drp] via OPHTHALMIC

## 2017-07-31 MED ORDER — CEFOTETAN DISODIUM-DEXTROSE 2-2.08 GM-%(50ML) IV SOLR
2.0000 g | INTRAVENOUS | Status: AC
  Administered 2017-07-31: 2 g via INTRAVENOUS

## 2017-07-31 MED ORDER — ENOXAPARIN SODIUM 40 MG/0.4ML ~~LOC~~ SOLN
SUBCUTANEOUS | Status: AC
  Administered 2017-07-31: 40 mg via SUBCUTANEOUS
  Filled 2017-07-31: qty 0.4

## 2017-07-31 MED ORDER — CLONAZEPAM 0.5 MG PO TABS: 0.5000 mg | ORAL_TABLET | Freq: Two times a day (BID) | ORAL | Status: DC | PRN

## 2017-07-31 MED ORDER — SCOPOLAMINE 1 MG/3DAYS TD PT72
1.0000 | MEDICATED_PATCH | Freq: Once | TRANSDERMAL | Status: DC
  Administered 2017-07-31: 1.5 mg via TRANSDERMAL

## 2017-07-31 MED ORDER — SCOPOLAMINE 1 MG/3DAYS TD PT72
MEDICATED_PATCH | TRANSDERMAL | Status: AC
  Administered 2017-07-31: 1.5 mg via TRANSDERMAL
  Filled 2017-07-31: qty 1

## 2017-07-31 MED ORDER — HYDROMORPHONE HCL 2 MG PO TABS
2.0000 mg | ORAL_TABLET | ORAL | Status: DC | PRN
  Administered 2017-07-31 – 2017-08-01 (×4): 2 mg via ORAL
  Filled 2017-07-31 (×4): qty 1

## 2017-07-31 MED ORDER — ACETAMINOPHEN 325 MG PO TABS: 650.0000 mg | ORAL_TABLET | ORAL | Status: DC | PRN

## 2017-07-31 MED ORDER — LIDOCAINE HCL (CARDIAC) PF 100 MG/5ML IV SOSY
PREFILLED_SYRINGE | INTRAVENOUS | Status: AC
  Filled 2017-07-31: qty 5

## 2017-07-31 MED ORDER — ACETAMINOPHEN 500 MG PO TABS
ORAL_TABLET | ORAL | Status: AC
  Administered 2017-07-31: 1000 mg via ORAL
  Filled 2017-07-31: qty 2

## 2017-07-31 MED ORDER — SODIUM CHLORIDE 0.9 % IJ SOLN
INTRAMUSCULAR | Status: AC
Start: 2017-07-31 — End: ?
  Filled 2017-07-31: qty 100

## 2017-07-31 MED ORDER — ACETAMINOPHEN 500 MG PO TABS
1000.0000 mg | ORAL_TABLET | Freq: Once | ORAL | Status: AC
  Administered 2017-07-31: 1000 mg via ORAL

## 2017-07-31 MED ORDER — VERAPAMIL HCL ER 240 MG PO TBCR
240.0000 mg | EXTENDED_RELEASE_TABLET | Freq: Every day | ORAL | Status: DC
  Administered 2017-07-31: 240 mg via ORAL
  Filled 2017-07-31 (×2): qty 1

## 2017-07-31 MED ORDER — BUPIVACAINE HCL (PF) 0.25 % IJ SOLN
INTRAMUSCULAR | Status: DC | PRN
  Administered 2017-07-31: 7 mL

## 2017-07-31 MED ORDER — EPHEDRINE SULFATE 50 MG/ML IJ SOLN
INTRAMUSCULAR | Status: DC | PRN
  Administered 2017-07-31 (×2): 5 mg via INTRAVENOUS

## 2017-07-31 MED ORDER — SUMATRIPTAN SUCCINATE 100 MG PO TABS
100.0000 mg | ORAL_TABLET | Freq: Once | ORAL | Status: DC | PRN
  Filled 2017-07-31: qty 1

## 2017-07-31 MED ORDER — VASOPRESSIN 20 UNIT/ML IV SOLN
INTRAVENOUS | Status: AC
  Filled 2017-07-31: qty 1

## 2017-07-31 MED ORDER — ONDANSETRON HCL 4 MG/2ML IJ SOLN
INTRAMUSCULAR | Status: AC
  Filled 2017-07-31: qty 2

## 2017-07-31 MED ORDER — FENTANYL CITRATE (PF) 250 MCG/5ML IJ SOLN
INTRAMUSCULAR | Status: AC
  Filled 2017-07-31: qty 5

## 2017-07-31 MED ORDER — PROPOFOL 10 MG/ML IV BOLUS
INTRAVENOUS | Status: AC
  Filled 2017-07-31: qty 20

## 2017-07-31 MED ORDER — FENTANYL CITRATE (PF) 100 MCG/2ML IJ SOLN
INTRAMUSCULAR | Status: AC
  Filled 2017-07-31: qty 2

## 2017-07-31 MED ORDER — FLUTICASONE PROPIONATE 50 MCG/ACT NA SUSP
1.0000 | Freq: Every day | NASAL | Status: DC
  Administered 2017-08-01: 2 via NASAL
  Filled 2017-07-31: qty 16

## 2017-07-31 MED ORDER — FAMOTIDINE IN NACL 20-0.9 MG/50ML-% IV SOLN
20.0000 mg | Freq: Two times a day (BID) | INTRAVENOUS | Status: DC
Start: 2017-07-31 — End: 2017-08-01
  Administered 2017-07-31 – 2017-08-01 (×2): 20 mg via INTRAVENOUS
  Filled 2017-07-31 (×3): qty 50

## 2017-07-31 MED ORDER — MENTHOL 3 MG MT LOZG: 1.0000 | LOZENGE | OROMUCOSAL | Status: DC | PRN

## 2017-07-31 MED ORDER — BUPIVACAINE HCL (PF) 0.25 % IJ SOLN
INTRAMUSCULAR | Status: AC
Start: 2017-07-31 — End: ?
  Filled 2017-07-31: qty 30

## 2017-07-31 MED ORDER — SUGAMMADEX SODIUM 200 MG/2ML IV SOLN
INTRAVENOUS | Status: DC | PRN
  Administered 2017-07-31: 200 mg via INTRAVENOUS

## 2017-07-31 MED ORDER — CEFOTETAN DISODIUM 2 G IJ SOLR
2.0000 g | Freq: Once | INTRAMUSCULAR | Status: AC
  Administered 2017-07-31: 2 g via INTRAVENOUS
  Filled 2017-07-31: qty 2

## 2017-07-31 MED ORDER — SIMETHICONE 80 MG PO CHEW
80.0000 mg | CHEWABLE_TABLET | Freq: Four times a day (QID) | ORAL | Status: DC | PRN
  Administered 2017-07-31 (×2): 80 mg via ORAL
  Filled 2017-07-31 (×2): qty 1

## 2017-07-31 MED ORDER — SODIUM CHLORIDE 0.9 % IJ SOLN
INTRAMUSCULAR | Status: AC
  Filled 2017-07-31: qty 20

## 2017-07-31 MED ORDER — BRIMONIDINE TARTRATE 0.15 % OP SOLN
1.0000 [drp] | Freq: Three times a day (TID) | OPHTHALMIC | Status: DC
  Administered 2017-07-31 – 2017-08-01 (×3): 1 [drp] via OPHTHALMIC
  Filled 2017-07-31: qty 5

## 2017-07-31 MED ORDER — PHENYLEPHRINE 40 MCG/ML (10ML) SYRINGE FOR IV PUSH (FOR BLOOD PRESSURE SUPPORT)
PREFILLED_SYRINGE | INTRAVENOUS | Status: AC
  Filled 2017-07-31: qty 10

## 2017-07-31 MED ORDER — ROCURONIUM BROMIDE 100 MG/10ML IV SOLN
INTRAVENOUS | Status: DC | PRN
  Administered 2017-07-31: 40 mg via INTRAVENOUS
  Administered 2017-07-31 (×6): 10 mg via INTRAVENOUS

## 2017-07-31 MED ORDER — SODIUM CHLORIDE 0.9 % IJ SOLN
INTRAMUSCULAR | Status: AC
  Filled 2017-07-31: qty 10

## 2017-07-31 MED ORDER — ROPIVACAINE HCL 5 MG/ML IJ SOLN
INTRAMUSCULAR | Status: AC
  Filled 2017-07-31: qty 30

## 2017-07-31 MED ORDER — FENTANYL CITRATE (PF) 100 MCG/2ML IJ SOLN
INTRAMUSCULAR | Status: DC | PRN
  Administered 2017-07-31: 50 ug via INTRAVENOUS
  Administered 2017-07-31 (×2): 25 ug via INTRAVENOUS
  Administered 2017-07-31: 100 ug via INTRAVENOUS
  Administered 2017-07-31 (×2): 50 ug via INTRAVENOUS
  Administered 2017-07-31 (×2): 25 ug via INTRAVENOUS

## 2017-07-31 MED ORDER — ROCURONIUM BROMIDE 100 MG/10ML IV SOLN
INTRAVENOUS | Status: AC
  Filled 2017-07-31: qty 1

## 2017-07-31 MED ORDER — SODIUM CHLORIDE 0.9 % IV SOLN
INTRAVENOUS | Status: AC
  Filled 2017-07-31: qty 2

## 2017-07-31 MED ORDER — ENOXAPARIN SODIUM 40 MG/0.4ML ~~LOC~~ SOLN
40.0000 mg | SUBCUTANEOUS | Status: AC
  Administered 2017-07-31: 40 mg via SUBCUTANEOUS

## 2017-07-31 MED ORDER — HYDROMORPHONE HCL 1 MG/ML IJ SOLN
INTRAMUSCULAR | Status: AC
  Filled 2017-07-31: qty 1

## 2017-07-31 MED ORDER — ONDANSETRON HCL 4 MG/2ML IJ SOLN
INTRAMUSCULAR | Status: DC | PRN
  Administered 2017-07-31: 4 mg via INTRAVENOUS

## 2017-07-31 MED ORDER — EPHEDRINE SULFATE-NACL 50-0.9 MG/10ML-% IV SOSY
PREFILLED_SYRINGE | INTRAVENOUS | Status: DC | PRN
  Administered 2017-07-31 (×2): 5 mg via INTRAVENOUS

## 2017-07-31 MED ORDER — MIDAZOLAM HCL 2 MG/2ML IJ SOLN
INTRAMUSCULAR | Status: AC
  Filled 2017-07-31: qty 2

## 2017-07-31 MED ORDER — PHENYLEPHRINE 40 MCG/ML (10ML) SYRINGE FOR IV PUSH (FOR BLOOD PRESSURE SUPPORT)
PREFILLED_SYRINGE | INTRAVENOUS | Status: DC | PRN
  Administered 2017-07-31 (×3): 80 ug via INTRAVENOUS

## 2017-07-31 MED ORDER — PROMETHAZINE HCL 25 MG/ML IJ SOLN: 6.2500 mg | INTRAMUSCULAR | Status: DC | PRN

## 2017-07-31 MED ORDER — ALUM & MAG HYDROXIDE-SIMETH 200-200-20 MG/5ML PO SUSP
30.0000 mL | ORAL | Status: DC | PRN
Start: 2017-07-31 — End: 2017-08-01

## 2017-07-31 SURGICAL SUPPLY — 62 items
ADH SKN CLS APL DERMABOND .7 (GAUZE/BANDAGES/DRESSINGS) ×2
APL SRG 38 LTWT LNG FL B (MISCELLANEOUS) ×2
APPLICATOR ARISTA FLEXITIP XL (MISCELLANEOUS) ×1 IMPLANT
BLADE SURG 10 STRL SS (BLADE) ×20 IMPLANT
BLADE SURG 11 STRL SS (BLADE) ×1 IMPLANT
CABLE HIGH FREQUENCY MONO STRZ (ELECTRODE) ×3 IMPLANT
COUNTER NEEDLE 1200 MAGNETIC (NEEDLE) ×1 IMPLANT
COVER LIGHT HANDLE  1/PK (MISCELLANEOUS) ×2
COVER LIGHT HANDLE 1/PK (MISCELLANEOUS) ×2 IMPLANT
COVER MAYO STAND STRL (DRAPES) ×3 IMPLANT
DERMABOND ADVANCED (GAUZE/BANDAGES/DRESSINGS) ×1
DERMABOND ADVANCED .7 DNX12 (GAUZE/BANDAGES/DRESSINGS) ×2 IMPLANT
DRAPE STERI URO 9X17 APER PCH (DRAPES) ×1 IMPLANT
DRAPE SURG 17X23 STRL (DRAPES) ×1 IMPLANT
DURAPREP 26ML APPLICATOR (WOUND CARE) ×3 IMPLANT
EXTRT SYSTEM ALEXIS 17CM (MISCELLANEOUS) ×3
GAUZE SPONGE 4X4 16PLY XRAY LF (GAUZE/BANDAGES/DRESSINGS) ×1 IMPLANT
GLOVE BIOGEL PI IND STRL 7.0 (GLOVE) ×8 IMPLANT
GLOVE BIOGEL PI INDICATOR 7.0 (GLOVE) ×4
GLOVE ECLIPSE 6.5 STRL STRAW (GLOVE) ×6 IMPLANT
GOWN STRL REUS W/TWL LRG LVL3 (GOWN DISPOSABLE) ×12 IMPLANT
HEMOSTAT ARISTA ABSORB 3G PWDR (MISCELLANEOUS) ×2 IMPLANT
LIGASURE VESSEL 5MM BLUNT TIP (ELECTROSURGICAL) ×3 IMPLANT
NDL SPNL 22GX3.5 QUINCKE BK (NEEDLE) IMPLANT
NEEDLE INSUFFLATION 120MM (ENDOMECHANICALS) ×3 IMPLANT
NEEDLE SPNL 22GX3.5 QUINCKE BK (NEEDLE) ×3 IMPLANT
NS IRRIG 1000ML POUR BTL (IV SOLUTION) ×3 IMPLANT
OCCLUDER COLPOPNEUMO (BALLOONS) ×4 IMPLANT
PACK LAPAROSCOPY BASIN (CUSTOM PROCEDURE TRAY) ×3 IMPLANT
PACK TRENDGUARD 450 HYBRID PRO (MISCELLANEOUS) IMPLANT
POUCH LAPAROSCOPIC INSTRUMENT (MISCELLANEOUS) ×3 IMPLANT
PROTECTOR NERVE ULNAR (MISCELLANEOUS) ×6 IMPLANT
RETRACTOR WOUND ALXS 19CM XSML (INSTRUMENTS) IMPLANT
RTRCTR WOUND ALEXIS 19CM XSML (INSTRUMENTS) ×9
SCISSORS LAP 5X35 DISP (ENDOMECHANICALS) ×3 IMPLANT
SET CYSTO W/LG BORE CLAMP LF (SET/KITS/TRAYS/PACK) ×3 IMPLANT
SET IRRIG TUBING LAPAROSCOPIC (IRRIGATION / IRRIGATOR) ×3 IMPLANT
SET TRI-LUMEN FLTR TB AIRSEAL (TUBING) ×3 IMPLANT
SHEARS HARMONIC ACE PLUS 36CM (ENDOMECHANICALS) ×3 IMPLANT
SUT VIC AB 0 CT1 27 (SUTURE) ×12
SUT VIC AB 0 CT1 27XBRD ANBCTR (SUTURE) ×4 IMPLANT
SUT VICRYL 0 UR6 27IN ABS (SUTURE) ×2 IMPLANT
SUT VICRYL 4-0 PS2 18IN ABS (SUTURE) ×6 IMPLANT
SUT VLOC 180 0 9IN  GS21 (SUTURE) ×2
SUT VLOC 180 0 9IN GS21 (SUTURE) ×2 IMPLANT
SYR 10ML LL (SYRINGE) ×3 IMPLANT
SYR 50ML LL SCALE MARK (SYRINGE) ×6 IMPLANT
SYR CONTROL 10ML LL (SYRINGE) ×1 IMPLANT
SYSTEM CONTND EXTRCTN KII BLLN (MISCELLANEOUS) IMPLANT
TIP UTERINE 5.1X6CM LAV DISP (MISCELLANEOUS) IMPLANT
TIP UTERINE 6.7X10CM GRN DISP (MISCELLANEOUS) IMPLANT
TIP UTERINE 6.7X6CM WHT DISP (MISCELLANEOUS) IMPLANT
TIP UTERINE 6.7X8CM BLUE DISP (MISCELLANEOUS) ×1 IMPLANT
TOWEL OR 17X24 6PK STRL BLUE (TOWEL DISPOSABLE) ×6 IMPLANT
TRAY FOLEY W/BAG SLVR 14FR (SET/KITS/TRAYS/PACK) ×3 IMPLANT
TRENDGUARD 450 HYBRID PRO PACK (MISCELLANEOUS) ×3
TROCAR ADV FIXATION 5X100MM (TROCAR) ×3 IMPLANT
TROCAR BALLN 12MMX100 BLUNT (TROCAR) ×1 IMPLANT
TROCAR PORT AIRSEAL 5X120 (TROCAR) ×4 IMPLANT
TROCAR XCEL NON BLADE 8MM B8LT (ENDOMECHANICALS) ×3 IMPLANT
TROCAR XCEL NON-BLD 5MMX100MML (ENDOMECHANICALS) ×3 IMPLANT
WARMER LAPAROSCOPE (MISCELLANEOUS) ×3 IMPLANT

## 2017-07-31 NOTE — Addendum Note (Signed)
Addendum  created 07/31/17 1826 by Jonna Munro, CRNA   Sign clinical note

## 2017-07-31 NOTE — H&P (Signed)
Angel Rodriguez is an 42 y.o. female [phone] Single Native American female with h/o of anemia due to menorrhagia and significantly enlarged uterus due to fibroids.  She has been on Depo Lupron for 4 months with improvement in hemoglobin to normal range.  Bleeding has stopped.  Most recent ultrasound 06/22/17 showed uterus to still be enlarged measurign 21 x 12 x 15cm with 13cm fundal anterior fibroids, 9cm fundal right fibroids 7cm righ mid uterine fibroids and 3cm lower uterine segment fibroid.  She has been cleared for surgery by neurology and hematology.  She has hx of cluster headaches, thrombocytopenia, glaucoma in right eye.  Left eye is completely normal.  Reviewed procedure, risks and benefits today.  She is absolutely ready to proceed.    Pertinent Gynecological History: Menses: amenorrhea with Depo Lupron Bleeding: none Contraception: tubal ligation DES exposure: denies Blood transfusions: none Sexually transmitted diseases: remote hx of trichomoniasis Previous GYN Procedures: hysteroscopy and endoemetrial ablation  Last mammogram: normal Date: 11/17 Last pap: normal Date: neg with neg RH HPV OB History: G5, P3, ectopic 1 (treated with MTX) and SAB 1   Menstrual History: No LMP recorded. Patient has had an injection.    Past Medical History:  Diagnosis Date  . Anemia   . Anxiety   . Blind right eye    swelling & pressure  . Cluster headaches    started after seizure  . Depression   . Dysmenorrhea   . Eye disorder   . Fibroids   . GERD (gastroesophageal reflux disease)    patient thinks due to medications  . History of pneumonia   . History of trichomoniasis   . Menorrhagia   . Migraines   . Pneumonia 07/05/2017  . Seizures ([place])    once saw neurologist, full body brain MRI and another 6 months lster    Past Surgical History:  Procedure Laterality Date  . ABLATION    . COLPOSCOPY    . EYE SURGERY     removed blood from right eye  . HYSTEROSCOPY W/D&C    . TUBAL  LIGATION    . WISDOM TOOTH EXTRACTION      Family History  Problem Relation Age of Onset  . Alcoholism Father   . Cirrhosis Father   . Cancer Maternal Grandmother        unsure of type    Social History:  reports that she has been smoking e-cigarettes and cigarettes.  She has smoked for the past 20.00 years. She has never used smokeless tobacco. She reports that she drinks alcohol. She reports that she does not use drugs.  Allergies:  Allergies  Allergen Reactions  . Ivp Dye [Iodinated Diagnostic Agents] Hives, Itching and Swelling  . Bee Venom Swelling    Medications Prior to Admission  Medication Sig Dispense Refill Last Dose  . acetaminophen-codeine (TYLENOL #3) 300-30 MG tablet One to two po qd 60 tablet 3 07/30/2017 at Unknown time  . atropine 1 % ophthalmic solution Place 1 drop into the right eye 3 (three) times daily.    07/31/2017 at 0530  . brimonidine (ALPHAGAN P) 0.1 % SOLN Place 1 drop into the right eye every 8 (eight) hours.    07/31/2017 at 0530  . calcium carbonate (TUMS EX) 750 MG chewable tablet Chew 2 tablets by mouth as needed for heartburn.   07/30/2017 at 1600  . clonazePAM (KLONOPIN) 0.5 MG tablet Take 1 tablet (0.5 mg total) by mouth 2 (two) times daily as needed. for  anxiety 180 tablet 1 07/31/2017 at 0530  . Cyanocobalamin (VITAMIN B-12 PO) Take 1 tablet by mouth daily.    Past Week at Unknown time  . dorzolamide-timolol (COSOPT) 22.3-6.8 MG/ML ophthalmic solution Place 1 drop into the right eye 2 (two) times daily.    Past Week at Unknown time  . fluticasone (FLONASE) 50 MCG/ACT nasal spray Place 1-2 sprays into both nostrils daily.   07/31/2017 at 0530  . latanoprost (XALATAN) 0.005 % ophthalmic solution Place 1 drop into both eyes at bedtime.   07/30/2017 at Unknown time  . Multiple Vitamin (MULTI-VITAMINS) TABS Take 1 tablet by mouth daily.    Past Week at Unknown time  . norethindrone (AYGESTIN) 5 MG tablet TAKE 1 TABLET (5 MG TOTAL) BY MOUTH 2 (TWO) TIMES  DAILY. ONCE BLEEDING STOPS CONTINUE 1 DAILY 60 tablet 0 07/31/2017 at 0530  . SUMAtriptan (IMITREX) 100 MG tablet Take 1 tablet (100 mg total) by mouth once as needed for migraine. May repeat in 2 hours if headache persists or recurs. 18 tablet 3 Past Month at Unknown time  . verapamil (CALAN-SR) 240 MG CR tablet Take 1 tablet (240 mg total) by mouth at bedtime. 90 tablet 3 07/30/2017 at 2200  . traMADol (ULTRAM) 50 MG tablet Take 1 tablet (50 mg total) by mouth every 6 (six) hours as needed. Can take 2 tablets every 6 hours for a max dose of 400mg /day, if needed. (Patient not taking: Reported on 07/23/2017) 40 tablet 0 Not Taking    Review of Systems  All other systems reviewed and are negative.   Blood pressure 101/61, pulse (!) 56, temperature 98.3 F (36.8 C), temperature source Oral, resp. rate 16, height 5' 0.25" (1.53 m), weight 175 lb 9 oz (79.6 kg), SpO2 99 %. Physical Exam  Constitutional: She is oriented to person, place, and time. She appears well-developed and well-nourished.  Cardiovascular: Normal rate and regular rhythm.  Respiratory: Effort normal and breath sounds normal.  Neurological: She is alert and oriented to person, place, and time.  Skin: Skin is warm and dry.  Psychiatric: She has a normal mood and affect.    Results for orders placed or performed during the hospital encounter of 07/31/17 (from the past 24 hour(s))  Pregnancy, urine     Status: None   Collection Time: 07/31/17  6:05 AM  Result Value Ref Range   Preg Test, Ur NEGATIVE NEGATIVE    No results found.  Assessment/Plan: 42 yo G5P3A1 ectopic 1 Single (in long term relationship) native [place] female here for definitive treatment of grossly enlarged fibroid uterus.  She is aware I am going to try to complete this all laparoscopically.  Procedure, risks, benefits reviewed today.  Questions answered.  She is ready to proceed.  Megan Salon 07/31/2017, 7:14 AM

## 2017-07-31 NOTE — Op Note (Addendum)
07/31/2017  1:11 PM  PATIENT:  Angel Rodriguez  42 y.o. female  PRE-OPERATIVE DIAGNOSIS:  Irregular uterine bleeding, fibroid uterus unresponsive to Lupron, anemia  POST-OPERATIVE DIAGNOSIS:  irregular uterine bleeding, fibroid uterus, unresponsive to Lupron, anemia  PROCEDURE:  Procedure(s): TOTAL LAPAROSCOPIC HYSTERECTOMY WITH SALPINGECTOMY CYSTOSCOPY  SURGEON:  Megan Salon  ASSISTANTS: Sumner Boast, MD   ANESTHESIA:   general  ESTIMATED BLOOD LOSS: 225 mL  BLOOD ADMINISTERED:none   FLUIDS: 1800cc LR  UOP: 425cc clear UOP  SPECIMEN:  Uterus, cervix, bilateral fallopian tubes  DISPOSITION OF SPECIMEN:  PATHOLOGY  FINDINGS: grossly enlarged uterus with multiple fibroids, normal ovaries, fallopian tubes with hx of BTL and a portion of each tube was missing, no adhesions, normal upper abdomen  DESCRIPTION OF OPERATION: Patient is taken to the operating room. She is placed in the supine position. She is a running IV in place. Informed consent was present on the chart. SCDs on her lower extremities and functioning properly. Patient was positioned while she was awake.  Her legs were placed in the low lithotomy position in [place]. Her arms were tucked by the side.  General endotracheal anesthesia was administered by the anesthesia staff without difficulty.  Time out performed.    Dura prep was then used to prep the abdomen and Betadine was used to prep the inner thighs, perineum and vagina. Once 3 minutes had past the patient was draped in a normal standard fashion. The legs were lifted to the high lithotomy position. The cervix was visualized by placing a heavy weighted speculum in the posterior aspect of the vagina and using a curved Deaver retractor to the retract anteriorly. The anterior lip of the cervix was grasped with single-tooth tenaculum.  The cervix sounded to 8 cm. Pratt dilators were used to dilate the cervix up to a #21. A RUMI uterine manipulator was obtained. A #  8 disposable tip was placed on the RUMI manipulator as well as a 3.0, silver KOH ring. This was passed through the cervix and the bulb of the disposable tip was inflated with 10 cc of normal saline. There was a good fit of the KOH ring around the cervix. The tenaculum was removed. There is also good manipulation of the uterus. The speculum and retractor were removed as well. A Foley catheter was placed to straight drain.  Clear urine was noted. Legs were lowered to the low lithotomy position and attention was turned the abdomen.  Location for LUQ entry identify. Skin anesthetized with 0.25% marcaine.  Skin incised with #11 blade.  73mm non bladed Optiview trochar and port with laparoscope inside trochar passed directly into the abdomen.  Then pneumoperitoneum was achieved.  The laparoscope was then used to confirm intraperitoneal placement. A large uterus with multiple fibroid was noted.  Ovaries and tubes could be seen by manipulating the uterus.  Ovaries were normal.  Locations for RLQ, LLQ ports were identified.  These were actually placed lateral to the umbilicus.  0.25% marcaine was used to anesthetize the skin.  6mm skin incisions were made.  An AirSeal 87mm port was placed underdirect visualization of the laparoscope to the right of the umbilicus.  Then a 63mm skin incision was made and a 6mm nonbladed trochar and port was placed to the left of the umbilicus.  Finally, an 55mm skin incision was made in the umbilicus that was everted.  Then the 57mm trochar and port was placed.  All trochars were removed.    Ureters were  identified.  Attention was turned to the right side. With uterus on stretch the right tube was excised off the ovary and mesosalpinx was dissected to free the tube. Then the right utero-ovarian pedicle was serially clamped cauterized and incised using the ligasure device. Right round ligament was serially clamped cauterized and incised. The anterior and posterior peritoneum of the inferior leaf  of the broad ligament were opened. The beginning of the bladder flap was created.  The bladder was taken down below the level of the KOH ring. The left uterine artery skeletonized.  Attention was turned the left side.  The uterus was placed on stretch to the opposite side.  The tube was excised off the ovary using sharp dissection a bipolar cautery.  The mesosalpinx was incised freeing the tube. Then the left uterine ovarian pedicle was serially clamped cauterized and incised. Next the left round ligament was serially clamped cauterized and incised. The anterior posterior peritoneum of the inferiorly for the broad ligament were opened. The anterior peritoneum was carried across to the dissection on the left side. The remainder of the bladder flap was created using sharp dissection. The bladder was well below the level of the KOH ring. The left uterine artery skeletonized. Then the left uterine artery, above the level of the KOH ring, was serially clamped and cauterized.  Attention was turned back to the left side and the uterine artery was serially clamped, cauterized and incised.  Attention was turned back to the left and the uterine artery was incised.  The uterus was devascularized at this point.    The colpotomy was performed a starting in the midline and using a harmonic scalpel with the inferior edge of the open blade  This was carried around a circumferential fashion until the vaginal mucosa was completely incised in the specimen was freed.  The specimen was too large to deliver into the vagina.  A #17 Alexis bag was placed in the pelvis and then into the abdomen.  The specimen was placed in the bag.  A vaginal occlusive device was used to maintain the pneumoperitoneum.    The umbilical incision and fascial incision were enlarged and the Alexis bag was brought through the umbilicus.  Pt was taken out of Trendelenburg positioning.  Then an extra small Alexis retractor was also placed in the incision to  protect the outer bag.  Then using #10 blade knife, the uterus was morcellated in the larger Alexis bag.  Entire specimen was morcellated this way and delivered through the umbilicus.  The specimen weighed 1200 grams.  Bag was intact at end of morcellation.  Then the umbilical fascial incision was closed using #0 Vicryl.  This was not tied tightly and a Sheryle Hail was placed in this incision.    Pneumoperitoneum was achieved again.  Pt was placed in Trendelenburg positioning again.  Some bleeding from cuff was noted.  This was suctioned and then instruments were changed with a needle driver and million dollar graspers.  Using a 9 inch V. lock suture, the cuff was closed by incorporating the anterior and posterior vaginal mucosa in each stitch. This was carried across all the way to the left corner and a running fashion. Two stitches were brought back towards the midline and the suture was cut flush with the vagina. The needle was brought out the pelvis. The pelvis was irrigated. All pedicles were inspected. No bleeding was noted. Ureters were noted deep in the pelvis to be peristalsing.  Arista was placed in  the pelvis.  At this point the procedure was completed. The LUQ and right and left lateral umbilical ports were removed.  Then the pneumoperitoneum was relived.  3 deep breaths were given by anesthesia and the umbilical port removed.  This was tied tightly and fascial was closed well at this point.    The skin was then closed with subcuticular stitches of 3-0 Vicryl. The skin was cleansed Dermabond was applied. Attention was then turned the vagina and the cuff was inspected. No bleeding was noted. The anterior posterior vaginal mucosa was incorporated in each stitch. The Foley catheter was removed.  Cystoscopy was performed.  No sutures or bladder injuries were noted.  Ureters were noted with normal urine jets from each one was seen.  Foley was left out after the cystoscopic fluid was drained and cystoscope  removed.  Sponge, lap, needle, initially counts were correct x2. Patient tolerated the procedure very well. She was awakened from anesthesia, extubated and taken to recovery in stable condition.   Surgical time total:  5 1/2 hours Started at 7:30 and surgeon left OR at 1300.    COUNTS:  YES  PLAN OF CARE: Transfer to PACU

## 2017-07-31 NOTE — OR Nursing (Signed)
Family updated by CFrediani,RN by phone at Dr. Ammie Ferrier request

## 2017-07-31 NOTE — OR Nursing (Signed)
Family updated by CFrediani,RN by phone per Dr. Sabra Heck

## 2017-07-31 NOTE — OR Nursing (Signed)
Family notified by CFrediani,RN that Dr. Sabra Heck is finishing and will be out to speak to them in the next 30 minutes. Pt. Is stable.

## 2017-07-31 NOTE — Transfer of Care (Signed)
Immediate Anesthesia Transfer of Care Note  Patient: Angel Rodriguez  Procedure(s) Performed: TOTAL LAPAROSCOPIC HYSTERECTOMY WITH SALPINGECTOMY (Bilateral Abdomen) CYSTOSCOPY (N/A Urethra)  Patient Location: PACU  Anesthesia Type:General  Level of Consciousness: sedated  Airway & Oxygen Therapy: Patient Spontanous Breathing and Patient connected to nasal cannula oxygen  Post-op Assessment: Report given to RN  Post vital signs: Reviewed and stable  Last Vitals:  Vitals Value Taken Time  BP 137/74 07/31/2017  1:25 PM  Temp    Pulse 106 07/31/2017  1:27 PM  Resp 15 07/31/2017  1:27 PM  SpO2 98 % 07/31/2017  1:27 PM  Vitals shown include unvalidated device data.  Last Pain:  Vitals:   07/31/17 0624  TempSrc: Oral      Patients Stated Pain Goal: 2 (48/01/65 [id number])  Complications: No apparent anesthesia complications

## 2017-07-31 NOTE — Anesthesia Postprocedure Evaluation (Signed)
Anesthesia Post Note  Patient: Angel Rodriguez  Procedure(s) Performed: TOTAL LAPAROSCOPIC HYSTERECTOMY WITH SALPINGECTOMY (Bilateral Abdomen) CYSTOSCOPY (N/A Urethra)     Patient location during evaluation: [facility] Anesthesia Type: General Level of consciousness: awake and alert, awake and oriented Pain management: pain level controlled Vital Signs Assessment: post-procedure vital signs reviewed and stable Respiratory status: spontaneous breathing, nonlabored ventilation and respiratory function stable Cardiovascular status: stable Postop Assessment: adequate PO intake, able to ambulate and no apparent nausea or vomiting Anesthetic complications: no    Last Vitals:  Vitals:   07/31/17 1454 07/31/17 1606  BP: 114/66 112/64  Pulse: 90 90  Resp: 18 18  Temp: 36.9 C 36.9 C  SpO2: 93% 95%    Last Pain:  Vitals:   07/31/17 1606  TempSrc: Oral  PainSc:    Pain Goal: Patients Stated Pain Goal: 2 (07/31/17 1430)               Willa Rough

## 2017-07-31 NOTE — Progress Notes (Signed)
Day of Surgery Procedure(s) (LRB): TOTAL LAPAROSCOPIC HYSTERECTOMY WITH SALPINGECTOMY (Bilateral) CYSTOSCOPY (N/A)  Subjective: Patient reports no nausea.  Has walked and voided.  Having some gas pain and some pain.  Has transitioned to oral dilaudid.  Cannot take NSAIDs due to thrombocytopenia.    Objective: I have reviewed patient's vital signs, intake and output, medications and labs. Voided:  420cc  General: alert and cooperative Resp: clear to auscultation bilaterally Cardio: regular rate and rhythm, S1, S2 normal, no murmur, click, rub or gallop GI: soft, appropriately tender, quiet, nondistended Extremities: extremities normal, atraumatic, no cyanosis or edema Vaginal Bleeding: minimal  Assessment: s/p Procedure(s) with comments: TOTAL LAPAROSCOPIC HYSTERECTOMY WITH SALPINGECTOMY (Bilateral) - 20 week size uterus/ Alexis bag in room/ need 4.5 hours CYSTOSCOPY (N/A): stable and progressing well  Plan: Advance diet Encourage ambulation CBC and BMP in AM  Dilaudid dosing adjusted to see if can improve pain control  LOS: 0 days    Megan Salon 07/31/2017, 6:22 PM

## 2017-07-31 NOTE — Anesthesia Procedure Notes (Signed)
Procedure Name: Intubation Date/Time: 07/31/2017 7:42 AM Performed by: Asher Muir, CRNA Pre-anesthesia Checklist: Patient identified, Emergency Drugs available, Suction available and Patient being monitored Patient Re-evaluated:Patient Re-evaluated prior to induction Oxygen Delivery Method: Circle system utilized and Simple face mask Preoxygenation: Pre-oxygenation with 100% oxygen Induction Type: IV induction Ventilation: Mask ventilation without difficulty Laryngoscope Size: Mac and 3 Grade View: Grade III Tube type: Oral Tube size: 7.0 mm Number of attempts: 1 Airway Equipment and Method: Stylet Placement Confirmation: ETT inserted through vocal cords under direct vision,  positive ETCO2 and breath sounds checked- equal and bilateral Secured at: 20 (right lip) cm Tube secured with: Tape Dental Injury: Teeth and Oropharynx as per pre-operative assessment

## 2017-07-31 NOTE — Progress Notes (Signed)
2255: Access patients' chart for chart audit.

## 2017-07-31 NOTE — Anesthesia Postprocedure Evaluation (Signed)
Anesthesia Post Note  Patient: Angel Rodriguez  Procedure(s) Performed: TOTAL LAPAROSCOPIC HYSTERECTOMY WITH SALPINGECTOMY (Bilateral Abdomen) CYSTOSCOPY (N/A Urethra)     Patient location during evaluation: PACU Anesthesia Type: General Level of consciousness: awake and alert Pain management: pain level controlled Vital Signs Assessment: post-procedure vital signs reviewed and stable Respiratory status: spontaneous breathing, nonlabored ventilation, respiratory function stable and patient connected to nasal cannula oxygen Cardiovascular status: blood pressure returned to baseline and stable Postop Assessment: no apparent nausea or vomiting Anesthetic complications: no    Last Vitals:  Vitals:   07/31/17 1454 07/31/17 1606  BP: 114/66 112/64  Pulse: 90 90  Resp: 18 18  Temp: 36.9 C 36.9 C  SpO2: 93% 95%    Last Pain:  Vitals:   07/31/17 1606  TempSrc: Oral  PainSc:    Pain Goal: Patients Stated Pain Goal: 2 (07/31/17 1430)               Ryan P Ellender

## 2017-08-01 ENCOUNTER — Encounter (HOSPITAL_COMMUNITY): Payer: Self-pay | Admitting: Obstetrics & Gynecology

## 2017-08-01 DIAGNOSIS — N800 Endometriosis of uterus: Secondary | ICD-10-CM | POA: Diagnosis not present

## 2017-08-01 LAB — BASIC METABOLIC PANEL
Anion gap: 9 (ref 5–15)
BUN: 7 mg/dL (ref 6–20)
CO2: 22 mmol/L (ref 22–32)
Calcium: 8.9 mg/dL (ref 8.9–10.3)
Chloride: 106 mmol/L (ref 101–111)
Creatinine, Ser: 0.75 mg/dL (ref 0.44–1.00)
GFR calc Af Amer: >60 mL/min (ref >60)
GFR calc non Af Amer: >60 mL/min (ref >60)
Glucose, Bld: 137 mg/dL — ABNORMAL HIGH (ref 65–99)
POTASSIUM: 4 mmol/L (ref 3.5–5.1)
SODIUM: 137 mmol/L (ref 135–145)

## 2017-08-01 LAB — CBC
HEMATOCRIT: 37 % (ref 36.0–46.0)
HEMOGLOBIN: 12.3 g/dL (ref 12.0–15.0)
MCH: 29.8 pg (ref 26.0–34.0)
MCHC: 33.2 g/dL (ref 30.0–36.0)
MCV: 89.6 fL (ref 78.0–100.0)
Platelets: 96 10*3/uL — ABNORMAL LOW (ref 150–400)
RBC: 4.13 MIL/uL (ref 3.87–5.11)
RDW: 14.9 % (ref 11.5–15.5)
WBC: 10.8 10*3/uL — ABNORMAL HIGH (ref 4.0–10.5)

## 2017-08-01 MED ORDER — ACETAMINOPHEN-CODEINE #3 300-30 MG PO TABS: 1.0000 | ORAL_TABLET | ORAL | 0 refills | Status: DC | PRN

## 2017-08-01 MED ORDER — ACETAMINOPHEN-CODEINE #3 300-30 MG PO TABS
1.0000 | ORAL_TABLET | ORAL | Status: DC | PRN
  Administered 2017-08-01 (×2): 2 via ORAL
  Filled 2017-08-01: qty 2
  Filled 2017-08-01 (×2): qty 1

## 2017-08-01 NOTE — Progress Notes (Addendum)
Discharge instructions reviewed with patient and her spouse. Pt strongly encouraged to start decreasing her narcotic use during the next few days per Dr. Sabra Heck, and ambulate often. Postpartum care reviewed and f/u appoint already made.

## 2017-08-01 NOTE — Progress Notes (Signed)
1 Day Post-Op Procedure(s) (LRB): TOTAL LAPAROSCOPIC HYSTERECTOMY WITH SALPINGECTOMY (Bilateral) CYSTOSCOPY (N/A)  Subjective: Patient reports pain that has not been under good control all night.  Neck and shoulder pain greatly helped by heating pad.  No nausea.  Eating regular diet.  Voiding.  Walking.  Passing flatus.  Lab work reviewed with pt.  Objective: I have reviewed patient's vital signs, intake and output, medications and labs.  General: alert and cooperative Resp: clear to auscultation bilaterally Cardio: regular rate and rhythm, S1, S2 normal, no murmur, click, rub or gallop GI: soft, mildly tender, normal BS Extremities: extremities normal, atraumatic, no cyanosis or edema Vaginal Bleeding: minimal  Inc:  C/D/I  Assessment: s/p Procedure(s) with comments: TOTAL LAPAROSCOPIC HYSTERECTOMY WITH SALPINGECTOMY (Bilateral) - 20 week size uterus/ Alexis bag in room/ need 4.5 hours CYSTOSCOPY (N/A): progressing well and post op pain  Plan: Adjusting pain medications.  Hopeful d/c later.  Did review pain medicaitons with pharmacist to help with modification.  LOS: 0 days    Megan Salon 08/01/2017, 10:25 AM

## 2017-08-01 NOTE — Discharge Instructions (Signed)
Post Op Hysterectomy Instructions Please read the instructions below. Refer to these instructions for the next few weeks. These instructions provide you with general information on caring for yourself after surgery. Your caregiver may also give you specific instructions. While your treatment has been planned according to the most current medical practices available, unavoidable problems sometimes happen. If you have any problems or questions after you leave, please call your caregiver.  HOME CARE INSTRUCTIONS Healing will take time. You will have discomfort, tenderness, swelling and bruising at the operative site for a couple of weeks. This is normal and will get better as time goes on.   Only take over-the-counter or prescription medicines for pain, discomfort or fever as directed by your caregiver.   Do not take aspirin. It can cause bleeding.   Do not drive when taking pain medication.   Resume your usual diet as directed and allowed.   Get plenty of rest and sleep.   Do not douche, use tampons, or have sexual intercourse for at least 8 week post operatively or until Dr. Sabra Heck advises this is ok.     Take your temperature if you feel hot or flushed.   You may shower today when you get home.  No tub bath for one week.    Do not drink alcohol until you are not taking any narcotic pain medications.   Try to have someone home with you for a week or two to help with the household activities.   Be careful over the next two to three weeks with any activities at home that involve lifting, pushing, or pulling.  Listen to your body--if something feels uncomfortable to do, then don't do it.  Make sure you and your family understands everything about your operation and recovery.   Walking up stairs is fine.  Do not sign any legal documents until you feel normal again.   Keep all your follow-up appointments as recommended by your caregiver.   PLEASE CALL THE OFFICE IF:  There is swelling,  redness or increasing pain in the wound area.   Pus is coming from the wound.   You notice a bad smell from the wound or surgical dressing.   You have pain, redness and swelling from the intravenous site.   The wound is breaking open (the edges are not staying together).    You develop pain or bleeding when you urinate.   You develop abnormal vaginal discharge.   You have any type of abnormal reaction or develop an allergy to your medication.   You need stronger pain medication for your pain   SEEK IMMEDIATE MEDICAL CARE:  You develop a temperature of 100.5 or higher.   You develop abdominal pain.   You develop chest pain.   You develop shortness of breath.   You pass out.   You develop pain, swelling or redness of your leg.   You develop heavy vaginal bleeding with or without blood clots.   MEDICATIONS:  Restart your regular medications BUT wait one week before restarting all vitamins and mineral supplements OR until you are having regular bowel movements  Use Tylenol #3 1-2 every 4 hours as needed for pain.  Use less pain medication as your pain improved.  You will need another appointment for any refills on pain medication.    You should start over the counter Colace 100mg  twice daily for helping to prevent constipation.  Start taking this once you get home and until you are having regular bowel  movements.

## 2017-08-07 ENCOUNTER — Ambulatory Visit (INDEPENDENT_AMBULATORY_CARE_PROVIDER_SITE_OTHER): Payer: PRIVATE HEALTH INSURANCE | Admitting: Obstetrics & Gynecology

## 2017-08-07 ENCOUNTER — Encounter: Payer: Self-pay | Admitting: Obstetrics & Gynecology

## 2017-08-07 VITALS — BP 126/88 | HR 68 | Resp 16 | Ht 60.25 in | Wt 174.6 lb

## 2017-08-07 DIAGNOSIS — Z9889 Other specified postprocedural states: Secondary | ICD-10-CM

## 2017-08-07 MED ORDER — ACETAMINOPHEN-CODEINE #3 300-30 MG PO TABS: 1.0000 | ORAL_TABLET | ORAL | 0 refills | Status: DC | PRN

## 2017-08-07 NOTE — Progress Notes (Signed)
Post Operative Visit  Procedure: Total Laparoscopic Hysterectomy with Salpingectomy, Cystoscopy.  Days Post-op: 7  Subjective: Doing well.  Smiling today.  Reports pain is well controlled.  Has about 10 Tylenol #3 left.  Would like RF.  Using primarily this for pain management.  Has worked from home almost every day since surgery, at least a few hours.  Voiding normally.  Having BMs.  Denies any LE edema.  Really happy with results of surgery. Denies vaginal bleeding.  Pathology reviewed with pt and questions answered.  Objective: BP 126/88 (BP Location: Right Arm, Patient Position: Sitting, Cuff Size: Large)   Pulse 68   Resp 16   Ht 5' 0.25" (1.53 m)   Wt 174 lb 9.6 oz (79.2 kg)   LMP 05/02/2017 Comment: bleeds daily  BMI 33.82 kg/m   EXAM General: alert and cooperative Resp: clear to auscultation bilaterally Cardio: regular rate and rhythm, S1, S2 normal, no murmur, click, rub or gallop GI: soft, non-tender; bowel sounds normal; no masses,  no organomegaly and incision: clean, dry and intact Extremities: extremities normal, atraumatic, no cyanosis or edema Vaginal Bleeding: none  Assessment: s/p TLH/bilaterl salpingectomy/cystoscopy  Plan: Recheck 3-4 weeks RF for tylenol #3 1-2 every 4-6 hours prn pain.  #30/0RF

## 2017-08-15 ENCOUNTER — Telehealth: Payer: Self-pay | Admitting: Obstetrics & Gynecology

## 2017-08-15 NOTE — Telephone Encounter (Signed)
Spoke with patient. S/p TLH/bilateral salpingectomy/cystoscopy 07/31/17.   Patient reports intermittent, sometimes painful,  "spasms" in stomach, upper and lower extremities. Patient states this has been ongoing since surgery.  Denies SOB, edema, lightheadedness, dizziness, vag bleeding, urinary complaints, N/V, fever/chills.   Hydrating well throughout the day, regular BMs. Tylenol 3 prn q6hrs for pain.   Recommended OV for further evaluation, OV scheduled for 6/13 at 3:30pm with Dr. Sabra Heck. Advised covering provider will review, I will return call with any additional recommendations. ER precautions provided should pain become severe or new symptoms develop after hours.   Patient request to reschedule 4 wk post op OV, rescheduled to 09/04/17 at 11:15am with Dr. Sabra Heck.   Routing to covering provider for final review.Patient is agreeable to disposition. Will close encounter.  Cc: Dr. Sabra Heck

## 2017-08-15 NOTE — Telephone Encounter (Signed)
Patient recently had surgery and is having muscle spasms all over her body.

## 2017-08-16 ENCOUNTER — Ambulatory Visit (INDEPENDENT_AMBULATORY_CARE_PROVIDER_SITE_OTHER): Payer: PRIVATE HEALTH INSURANCE | Admitting: Obstetrics & Gynecology

## 2017-08-16 ENCOUNTER — Other Ambulatory Visit: Payer: Self-pay

## 2017-08-16 ENCOUNTER — Encounter: Payer: Self-pay | Admitting: Obstetrics & Gynecology

## 2017-08-16 VITALS — BP 126/72 | HR 74 | Resp 12 | Ht 60.0 in | Wt 175.5 lb

## 2017-08-16 DIAGNOSIS — R252 Cramp and spasm: Secondary | ICD-10-CM

## 2017-08-16 NOTE — Progress Notes (Signed)
GYNECOLOGY  VISIT  CC:   Muscle spasms  HPI: 42 y.o. [id number] Single Caucasian female here for complaint of muscle spasms in her arms and legs.  This feels like uncontrolled jerking.  Feels very muscular to her and not neurologic.  Headaches have been under good control.  She is 16 days post-op and feels really good from a post-op standpoint.  Having regular bowel movements.  Emptying bladder easily.  Taking one Tylenol #3 about every 8 hours but is working to wean off these completely.  Working form home.  Energy is improving.  Denies vaginal bleeding.    GYNECOLOGIC HISTORY: Patient's last menstrual period was 05/02/2017. Contraception: hysterectomy Menopausal hormone therapy: none  Patient Active Problem List   Diagnosis Date Noted  . Abnormal MRI of head 01/21/2016  . Anemia 01/12/2016  . Postpartum depression 01/12/2016  . Vascular headache 09/15/2015  . Insomnia 09/15/2015  . Headache 07/08/2015  . Headache, chronic migraine without aura, intractable 07/08/2015  . Anxiety state 07/08/2015  . Neck pain 06/24/2015  . Convulsions ([place]) 06/24/2015  . Central retinal vein occlusion 12/11/2011  . Cotton wool exudates 12/11/2011  . Cystoid macular edema 12/11/2011    Past Medical History:  Diagnosis Date  . Anemia   . Anxiety   . Blind right eye    swelling & pressure  . Cluster headaches    started after seizure  . Depression   . Dysmenorrhea   . Fibroids   . GERD (gastroesophageal reflux disease)    patient thinks due to medications  . History of pneumonia   . History of trichomoniasis   . Menorrhagia   . Migraines   . Pneumonia 07/05/2017  . Seizures ([place])    once saw neurologist, full body brain MRI and another 6 months lster    Past Surgical History:  Procedure Laterality Date  . ABLATION    . COLPOSCOPY    . CYSTOSCOPY N/A 07/31/2017   Procedure: CYSTOSCOPY;  Surgeon: Megan Salon, MD;  Location: [facility];  Service: Gynecology;  Laterality: N/A;  . EYE  SURGERY     removed blood from right eye  . HYSTEROSCOPY W/D&C    . TOTAL LAPAROSCOPIC HYSTERECTOMY WITH SALPINGECTOMY Bilateral 07/31/2017   Procedure: TOTAL LAPAROSCOPIC HYSTERECTOMY WITH SALPINGECTOMY;  Surgeon: Megan Salon, MD;  Location: [facility];  Service: Gynecology;  Laterality: Bilateral;  20 week size uterus/ Alexis bag in room/ need 4.5 hours  . TUBAL LIGATION    . WISDOM TOOTH EXTRACTION      MEDS:   Current Outpatient Medications on File Prior to Visit  Medication Sig Dispense Refill  . acetaminophen-codeine (TYLENOL #3) 300-30 MG tablet Take 1-2 tablets by mouth every 4 (four) hours as needed for moderate pain or severe pain (for post op pain). 30 tablet 0  . atropine 1 % ophthalmic solution Place 1 drop into the right eye 3 (three) times daily.     . brimonidine (ALPHAGAN P) 0.1 % SOLN Place 1 drop into the right eye every 8 (eight) hours.     . calcium carbonate (TUMS EX) 750 MG chewable tablet Chew 2 tablets by mouth as needed for heartburn.    . clonazePAM (KLONOPIN) 0.5 MG tablet Take 1 tablet (0.5 mg total) by mouth 2 (two) times daily as needed. for anxiety 180 tablet 1  . Cyanocobalamin (VITAMIN B-12 PO) Take 1 tablet by mouth daily.     . dorzolamide-timolol (COSOPT) 22.3-6.8 MG/ML ophthalmic solution Place 1 drop into the  right eye 2 (two) times daily.     . fluticasone (FLONASE) 50 MCG/ACT nasal spray Place 1-2 sprays into both nostrils daily.    [name] latanoprost (XALATAN) 0.005 % ophthalmic solution Place 1 drop into both eyes at bedtime.    . Multiple Vitamin (MULTI-VITAMINS) TABS Take 1 tablet by mouth daily.     . SUMAtriptan (IMITREX) 100 MG tablet Take 1 tablet (100 mg total) by mouth once as needed for migraine. May repeat in 2 hours if headache persists or recurs. 18 tablet 3  . verapamil (CALAN-SR) 240 MG CR tablet Take 1 tablet (240 mg total) by mouth at bedtime. 90 tablet 3   Current Facility-Administered Medications on File Prior to Visit  Medication Dose  Route Frequency Provider Last Rate Last Dose  . gadopentetate dimeglumine (MAGNEVIST) injection 16 mL  16 mL Intravenous Once PRN Sater, Nanine Means, MD        ALLERGIES: Ivp dye [iodinated diagnostic agents] and Bee venom  Family History  Problem Relation Age of Onset  . Alcoholism Father   . Cirrhosis Father   . Cancer Maternal Grandmother        unsure of type    SH:  Single, non smoker  Review of Systems  Musculoskeletal:       Arm and leg muscle spasms  All other systems reviewed and are negative.   PHYSICAL EXAMINATION:    BP 126/72 (BP Location: Right Arm, Patient Position: Sitting, Cuff Size: Normal)   Pulse 74   Resp 12   Ht 5' (1.524 m)   Wt 175 lb 8 oz (79.6 kg)   LMP 05/02/2017 Comment: bleeds daily  BMI 34.27 kg/m     Physical Exam  Constitutional: She is oriented to person, place, and time. She appears well-developed and well-nourished.  HENT:  Head: Normocephalic and atraumatic.  Neck: Normal range of motion. Neck supple. No thyromegaly present.  Cardiovascular: Normal rate and regular rhythm.  Respiratory: Effort normal and breath sounds normal.  GI: Soft. Bowel sounds are normal. She exhibits no distension. There is no tenderness. There is no rebound.  Incisions:  C/D/I, healing well  Musculoskeletal: Normal range of motion.  Neurological: She is alert and oriented to person, place, and time. She has normal reflexes. No cranial nerve deficit. Coordination normal.  Skin: Skin is warm and dry.  Psychiatric: She has a normal mood and affect.    Chaperone was present for exam.  Assessment: Muscle spams  Plan: Magnesium, ferritin and iron levels, calcium level will be obtained today.

## 2017-08-16 NOTE — Patient Instructions (Signed)
Dr. Nanetta Batty [phone]

## 2017-08-17 LAB — IRON,TIBC AND FERRITIN PANEL
FERRITIN: 83 ng/mL (ref 15–150)
IRON SATURATION: 28 % (ref 15–55)
IRON: 94 ug/dL (ref 27–159)
TIBC: 332 ug/dL (ref 250–450)
UIBC: 238 ug/dL (ref 131–425)

## 2017-08-17 LAB — MAGNESIUM: Magnesium: 1.7 mg/dL (ref 1.6–2.3)

## 2017-08-17 LAB — COMPREHENSIVE METABOLIC PANEL
ALK PHOS: 45 IU/L (ref 39–117)
ALT: 19 IU/L (ref 0–32)
AST: 14 IU/L (ref 0–40)
Albumin/Globulin Ratio: 1.8 (ref 1.2–2.2)
Albumin: 4.6 g/dL (ref 3.5–5.5)
BUN/Creatinine Ratio: 12 (ref 9–23)
BUN: 8 mg/dL (ref 6–24)
Bilirubin Total: 0.3 mg/dL (ref 0.0–1.2)
CO2: 21 mmol/L (ref 20–29)
CREATININE: 0.69 mg/dL (ref 0.57–1.00)
Calcium: 10.8 mg/dL — ABNORMAL HIGH (ref 8.7–10.2)
Chloride: 101 mmol/L (ref 96–106)
GFR calc Af Amer: 123 mL/min/{1.73_m2} (ref >59)
GFR calc non Af Amer: 107 mL/min/{1.73_m2} (ref >59)
GLUCOSE: 89 mg/dL (ref 65–99)
Globulin, Total: 2.6 g/dL (ref 1.5–4.5)
Potassium: 3.9 mmol/L (ref 3.5–5.2)
Sodium: 138 mmol/L (ref 134–144)
Total Protein: 7.2 g/dL (ref 6.0–8.5)

## 2017-08-20 ENCOUNTER — Telehealth: Payer: Self-pay | Admitting: *Deleted

## 2017-08-20 ENCOUNTER — Other Ambulatory Visit: Payer: Self-pay | Admitting: Obstetrics & Gynecology

## 2017-08-20 NOTE — Telephone Encounter (Signed)
-----   Message from Megan Salon, MD sent at 08/20/2017  7:22 AM EDT ----- Please let pt know her calcium is elevated.  This may explain her muscle twitching.  Now I need to figure out the cause.  Iron and magnesium were normal.  She needs to return for Vit D level and PTH with calcium.  Orders placed.

## 2017-08-20 NOTE — Telephone Encounter (Signed)
Pt notified. Verbalized understanding. Patient states she will call back to make lab appt.

## 2017-08-20 NOTE — Telephone Encounter (Signed)
LM for pt to call back.

## 2017-08-24 ENCOUNTER — Telehealth: Payer: Self-pay | Admitting: Obstetrics & Gynecology

## 2017-08-24 NOTE — Telephone Encounter (Signed)
42 yo [id number] Single Native American female who underwent total laparoscopic hysterectomy of significantly enlarged uterus on 07/31/17.  Has been progressing appropriately from a post op standpoint and has been seen in the office twice since surgery.  She was seen 08/16/17 due to abnormal muscle twitching.  Calcium level was elevated.  She was recommended to return for additional blood work.  Has not done that yet.  Report having some increased abdominal pain last night.  Took a Tylenol #3 at that time.  Feels the pain eased.  Then about two hours ago, started having increased abdominal pain again that is located on her right side.  This is associated with uncomfortable shooting pains across her abdomen.  Denies vaginal bleeding, fever, nausea/vomiting/diarrhea.  Voiding without difficulty.  No palpitations/SOB.  Did have a BM this morning.  Has not been as regular with BMs since surgery.  Is not taking a stool softener.  Was recommended to do this post op.  Reports she took 2-Tylenol #3 about 30 minutes ago.   She cannot take Ibuprofen per hematology due to thrombocytopenia.    Advised pt I recommend being seen due to amount of pain she is having.  As it is after hours, she needs to be seen at the ER.  Voices understanding.

## 2017-08-31 ENCOUNTER — Ambulatory Visit: Payer: PRIVATE HEALTH INSURANCE | Admitting: Obstetrics & Gynecology

## 2017-09-04 ENCOUNTER — Telehealth: Payer: Self-pay | Admitting: Obstetrics & Gynecology

## 2017-09-04 ENCOUNTER — Ambulatory Visit: Payer: PRIVATE HEALTH INSURANCE | Admitting: Obstetrics & Gynecology

## 2017-09-04 NOTE — Progress Notes (Deleted)
Post Operative Visit  Procedure: TOTAL LAPAROSCOPIC HYSTERECTOMY WITH SALPINGECTOMY, CYSTOSCOPY Days Post-op: 34 days  Subjective: ***  Objective: LMP 05/02/2017 Comment: bleeds daily  EXAM General: {Exam; general:16600} Resp: {Exam; lung:16931} Cardio: {Exam; heart:5510} GI: {Exam, HL:4562563} Extremities: {Exam; extremity:5109} Vaginal Bleeding: {exam; vaginal bleeding:3041122}  Assessment: s/p ***  Plan: Recheck {NUMBER 1-10:22536} weeks ***

## 2017-09-04 NOTE — Telephone Encounter (Signed)
Encounter closed

## 2017-09-04 NOTE — Telephone Encounter (Signed)
Called patient and offered her an appointment on 09/10/17 at 8:15 AM with Dr. Sabra Heck. Patient scheduled the appointment and expressed appreciation.  Routing to doctor for [place].

## 2017-09-04 NOTE — Telephone Encounter (Signed)
Patient called and cancelled her appointment for today with Dr. Sabra Heck for a 4 week post op appointment due to her car broke down on the way to her appointment. She'd like to be worked into the schedule this afternoon, if possible. She is hoping to go back to work next week.

## 2017-09-04 NOTE — Telephone Encounter (Signed)
Routing to triage to see pt has been called.  Ok to close encounter.

## 2017-09-10 ENCOUNTER — Ambulatory Visit: Payer: PRIVATE HEALTH INSURANCE | Admitting: Obstetrics & Gynecology

## 2017-09-10 ENCOUNTER — Ambulatory Visit (INDEPENDENT_AMBULATORY_CARE_PROVIDER_SITE_OTHER): Payer: PRIVATE HEALTH INSURANCE | Admitting: Obstetrics & Gynecology

## 2017-09-10 ENCOUNTER — Encounter: Payer: Self-pay | Admitting: Obstetrics & Gynecology

## 2017-09-10 VITALS — BP 102/60 | HR 60 | Resp 14 | Ht 60.0 in | Wt 173.0 lb

## 2017-09-10 DIAGNOSIS — E559 Vitamin D deficiency, unspecified: Secondary | ICD-10-CM

## 2017-09-10 DIAGNOSIS — Z9889 Other specified postprocedural states: Secondary | ICD-10-CM

## 2017-09-10 NOTE — Progress Notes (Signed)
Post Operative Visit  Procedure: Total Laparoscopic Hysterectomy with Salpingectomy, cystoscopy.  Days Post-op: 41  Subjective: Doing well.  No vaginal bleeding.  Stopped pain medication except for when she had a headache.  Voiding fine.  Able to hold urine for much longer.  Bowel movements are normal.  Energy is improving.  Wants to go back to work today.  Muscle twitching has improved.  She did not come back for the recommended blood work.   Objective: BP 102/60 (BP Location: Right Arm, Patient Position: Sitting, Cuff Size: Large)   Pulse 60   Resp 14   Ht 5' (1.524 m)   Wt 173 lb (78.5 kg)   LMP 05/02/2017 Comment: bleeds daily  BMI 33.79 kg/m   EXAM General: alert and no distress Resp: clear to auscultation bilaterally Cardio: regular rate and rhythm, S1, S2 normal, no murmur, click, rub or gallop GI: soft, non-tender; bowel sounds normal; no masses,  no organomegaly and incision: clean, dry and intact Extremities: extremities normal, atraumatic, no cyanosis or edema  Gyn:  NAEFG, vagina cuff healing well, no masses or fullness, sutures no Vaginal Bleeding: none  Assessment: s/p TLH/Bilateral salpingectomy/cystoscopy, six weeks post op Elevated calcium level previously  Plan: 1 year follow up for routine exam Calcium and PTH and Vit D levels will be obtained today Note given to return to work

## 2017-09-11 LAB — PTH, INTACT AND CALCIUM
CALCIUM: 10.4 mg/dL — AB (ref 8.7–10.2)
PTH: 14 pg/mL — AB (ref 15–65)

## 2017-09-11 LAB — VITAMIN D 25 HYDROXY (VIT D DEFICIENCY, FRACTURES): VIT D 25 HYDROXY: 21.4 ng/mL — AB (ref 30.0–100.0)

## 2017-09-19 ENCOUNTER — Inpatient Hospital Stay (HOSPITAL_COMMUNITY): Payer: PRIVATE HEALTH INSURANCE | Admitting: Anesthesiology

## 2017-09-19 ENCOUNTER — Encounter (HOSPITAL_COMMUNITY): Payer: Self-pay | Admitting: Emergency Medicine

## 2017-09-19 ENCOUNTER — Ambulatory Visit (HOSPITAL_COMMUNITY)
Admission: EM | Admit: 2017-09-19 | Discharge: 2017-09-20 | Disposition: A | Discharge status: Home / Self Care | Payer: PRIVATE HEALTH INSURANCE | Attending: Obstetrics and Gynecology | Admitting: Obstetrics and Gynecology

## 2017-09-19 ENCOUNTER — Emergency Department (HOSPITAL_COMMUNITY): Payer: PRIVATE HEALTH INSURANCE

## 2017-09-19 ENCOUNTER — Other Ambulatory Visit: Payer: Self-pay

## 2017-09-19 ENCOUNTER — Encounter (HOSPITAL_COMMUNITY): Admission: EM | Disposition: A | Discharge status: Home / Self Care | Payer: Self-pay | Source: Home / Self Care

## 2017-09-19 ENCOUNTER — Telehealth: Payer: Self-pay | Admitting: *Deleted

## 2017-09-19 DIAGNOSIS — Z91041 Radiographic dye allergy status: Secondary | ICD-10-CM | POA: Insufficient documentation

## 2017-09-19 DIAGNOSIS — Z91030 Bee allergy status: Secondary | ICD-10-CM | POA: Insufficient documentation

## 2017-09-19 DIAGNOSIS — R112 Nausea with vomiting, unspecified: Secondary | ICD-10-CM | POA: Diagnosis not present

## 2017-09-19 DIAGNOSIS — F329 Major depressive disorder, single episode, unspecified: Secondary | ICD-10-CM | POA: Diagnosis not present

## 2017-09-19 DIAGNOSIS — R569 Unspecified convulsions: Secondary | ICD-10-CM | POA: Diagnosis not present

## 2017-09-19 DIAGNOSIS — D696 Thrombocytopenia, unspecified: Secondary | ICD-10-CM | POA: Diagnosis not present

## 2017-09-19 DIAGNOSIS — C561 Malignant neoplasm of right ovary: Secondary | ICD-10-CM | POA: Insufficient documentation

## 2017-09-19 DIAGNOSIS — N83511 Torsion of right ovary and ovarian pedicle: Secondary | ICD-10-CM | POA: Diagnosis not present

## 2017-09-19 DIAGNOSIS — F17290 Nicotine dependence, other tobacco product, uncomplicated: Secondary | ICD-10-CM | POA: Diagnosis not present

## 2017-09-19 DIAGNOSIS — H5461 Unqualified visual loss, right eye, normal vision left eye: Secondary | ICD-10-CM | POA: Diagnosis not present

## 2017-09-19 DIAGNOSIS — N83519 Torsion of ovary and ovarian pedicle, unspecified side: Secondary | ICD-10-CM | POA: Diagnosis present

## 2017-09-19 DIAGNOSIS — F419 Anxiety disorder, unspecified: Secondary | ICD-10-CM | POA: Insufficient documentation

## 2017-09-19 DIAGNOSIS — F17210 Nicotine dependence, cigarettes, uncomplicated: Secondary | ICD-10-CM | POA: Diagnosis not present

## 2017-09-19 DIAGNOSIS — Z90710 Acquired absence of both cervix and uterus: Secondary | ICD-10-CM | POA: Insufficient documentation

## 2017-09-19 DIAGNOSIS — G44009 Cluster headache syndrome, unspecified, not intractable: Secondary | ICD-10-CM | POA: Insufficient documentation

## 2017-09-19 DIAGNOSIS — Z79899 Other long term (current) drug therapy: Secondary | ICD-10-CM | POA: Diagnosis not present

## 2017-09-19 DIAGNOSIS — N736 Female pelvic peritoneal adhesions (postinfective): Secondary | ICD-10-CM | POA: Diagnosis not present

## 2017-09-19 DIAGNOSIS — R1031 Right lower quadrant pain: Secondary | ICD-10-CM

## 2017-09-19 HISTORY — DX: Personal history of diseases of the blood and blood-forming organs and certain disorders involving the immune mechanism: Z86.2

## 2017-09-19 HISTORY — PX: LAPAROSCOPY: SHX197

## 2017-09-19 HISTORY — DX: Thrombocytopenia, unspecified: D69.6

## 2017-09-19 LAB — WET PREP, GENITAL
CLUE CELLS WET PREP: NONE SEEN
Sperm: NONE SEEN
TRICH WET PREP: NONE SEEN
YEAST WET PREP: NONE SEEN

## 2017-09-19 LAB — COMPREHENSIVE METABOLIC PANEL
ALT: 33 U/L (ref 0–44)
AST: 26 U/L (ref 15–41)
Albumin: 4.2 g/dL (ref 3.5–5.0)
Alkaline Phosphatase: 49 U/L (ref 38–126)
Anion gap: 10 (ref 5–15)
BUN: 7 mg/dL (ref 6–20)
CHLORIDE: 103 mmol/L (ref 98–111)
CO2: 25 mmol/L (ref 22–32)
CREATININE: 0.7 mg/dL (ref 0.44–1.00)
Calcium: 10.2 mg/dL (ref 8.9–10.3)
GFR calc Af Amer: >60 mL/min (ref >60)
GFR calc non Af Amer: >60 mL/min (ref >60)
Glucose, Bld: 131 mg/dL — ABNORMAL HIGH (ref 70–99)
POTASSIUM: 3.9 mmol/L (ref 3.5–5.1)
SODIUM: 138 mmol/L (ref 135–145)
Total Bilirubin: 0.5 mg/dL (ref 0.3–1.2)
Total Protein: 7.6 g/dL (ref 6.5–8.1)

## 2017-09-19 LAB — CBC
HCT: 38.3 % (ref 36.0–46.0)
HEMATOCRIT: 40.8 % (ref 36.0–46.0)
HEMOGLOBIN: 13.7 g/dL (ref 12.0–15.0)
HEMOGLOBIN: 13 g/dL (ref 12.0–15.0)
MCH: 29.8 pg (ref 26.0–34.0)
MCH: 30.2 pg (ref 26.0–34.0)
MCHC: 33.6 g/dL (ref 30.0–36.0)
MCHC: 33.9 g/dL (ref 30.0–36.0)
MCV: 88.9 fL (ref 78.0–100.0)
MCV: 88.9 fL (ref 78.0–100.0)
PLATELETS: 71 10*3/uL — AB (ref 150–400)
PLATELETS: 67 10*3/uL — AB (ref 150–400)
RBC: 4.59 MIL/uL (ref 3.87–5.11)
RBC: 4.31 MIL/uL (ref 3.87–5.11)
RDW: 13.2 % (ref 11.5–15.5)
RDW: 13.2 % (ref 11.5–15.5)
WBC: 9.9 10*3/uL (ref 4.0–10.5)
WBC: 14.2 10*3/uL — AB (ref 4.0–10.5)

## 2017-09-19 LAB — URINALYSIS, ROUTINE W REFLEX MICROSCOPIC
BILIRUBIN URINE: NEGATIVE
Glucose, UA: NEGATIVE mg/dL
KETONES UR: NEGATIVE mg/dL
Leukocytes, UA: NEGATIVE
Nitrite: NEGATIVE
PROTEIN: 30 mg/dL — AB
Specific Gravity, Urine: 1.017 (ref 1.005–1.030)
pH: 7 (ref 5.0–8.0)

## 2017-09-19 LAB — RAPID HIV SCREEN (HIV 1/2 AB+AG)
HIV 1/2 Antibodies: NONREACTIVE
HIV-1 P24 ANTIGEN - HIV24: NONREACTIVE

## 2017-09-19 LAB — LIPASE, BLOOD: LIPASE: 37 U/L (ref 11–51)

## 2017-09-19 IMAGING — CT CT ABD-PELV W/O CM
2 of 4 series · 16 of 46 positions shown, 18 images · non-contrast
Comparison: None.

ADDENDUM:
Given the patient's recent hysterectomy, the soft tissue density in
the central pelvis cannot represent a fibroid uterus. This measures
9.4 x 7.0 cm, and contains several internal low-attenuation cystic
areas. This is suspicious for a swollen right ovary due to ovarian
torsion, with ovarian neoplasm considered less likely.

Recommend clinical correlation, and consider pelvic ultrasound with
Doppler for further evaluation. This was discussed with the
patient's caretaker in the [facility], at the time of this
addendum at [9U] hours on [DATE].
CLINICAL DATA: Right lower quadrant pain and nausea. Suspected
appendicitis. Approximately 2 months status post hysterectomy.
EXAM:
CT ABDOMEN AND PELVIS WITHOUT CONTRAST
TECHNIQUE: Multidetector CT imaging of the abdomen and pelvis was performed
following the standard protocol without IV contrast.

[Series 3: ap without · axial · non-contrast · 0.72mm/px · z∈[+777,+1182]mm · 13 of 93 slices shown, 15 images]
[im 6/93  soft-tissue]
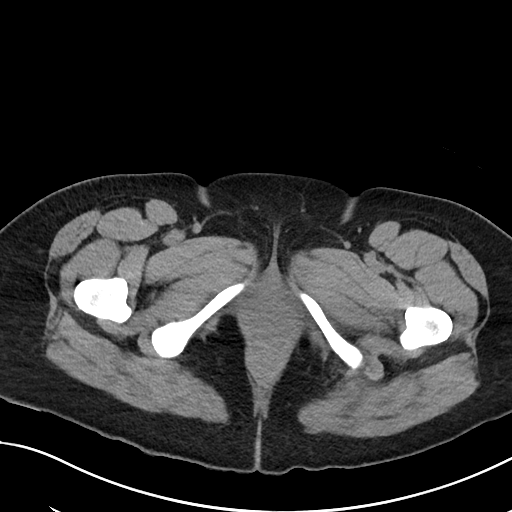
[im 6/93  bone]
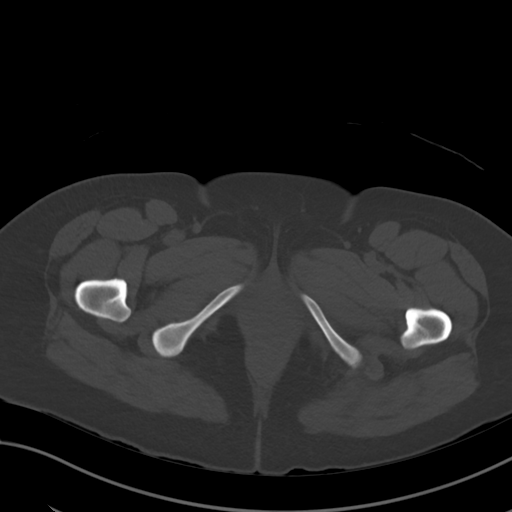
[im 11/93  soft-tissue]
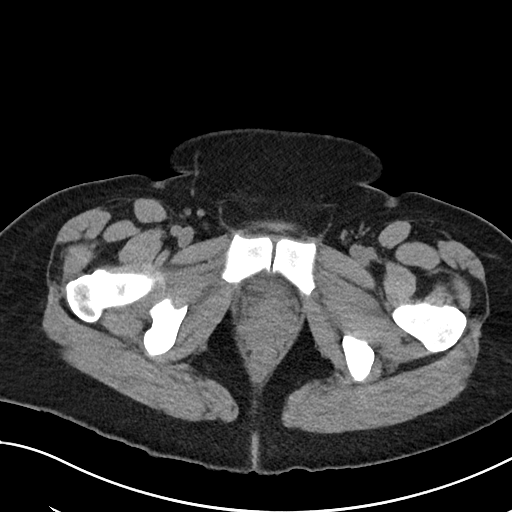
[im 21/93  soft-tissue]
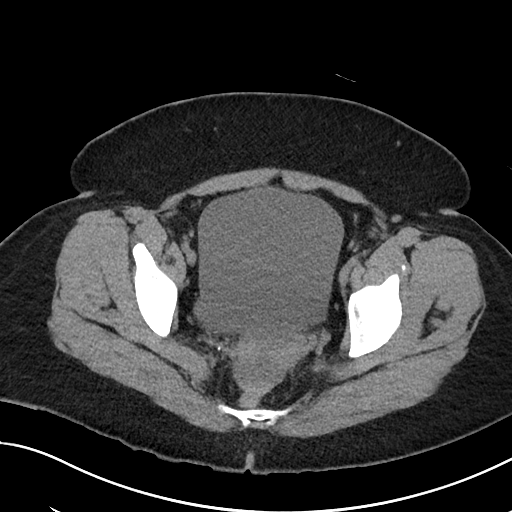
[im 26/93  soft-tissue]
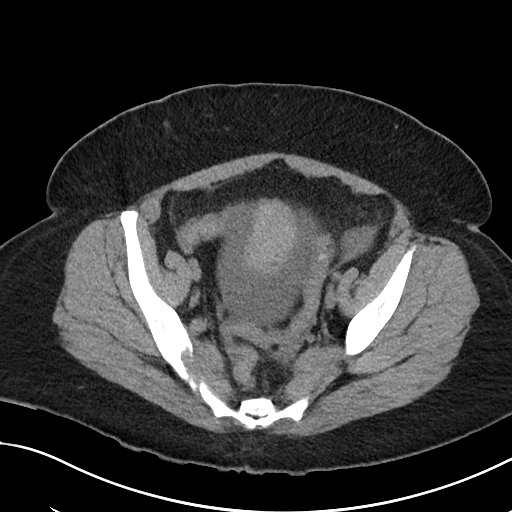
[im 31/93  soft-tissue]
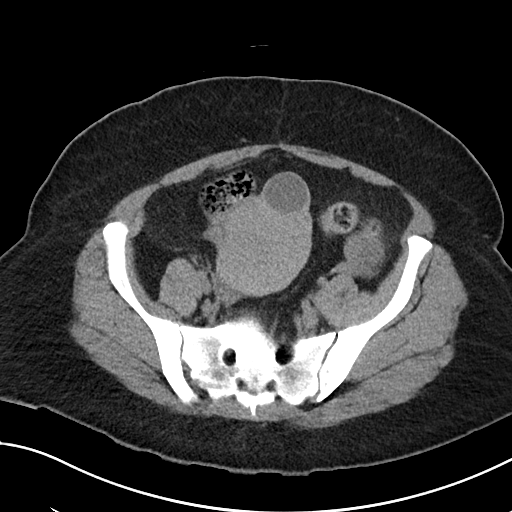
[im 41/93  soft-tissue]
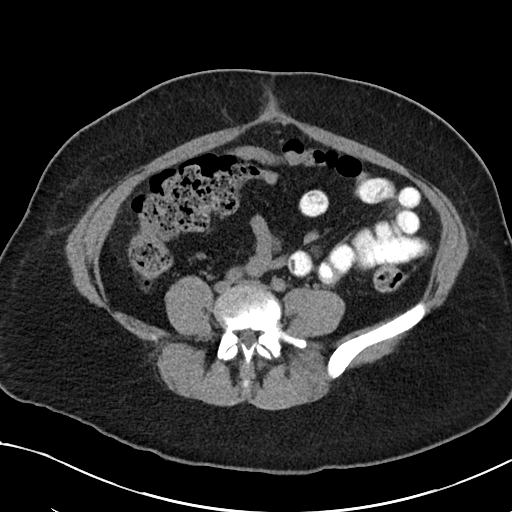
[im 47/93  soft-tissue]
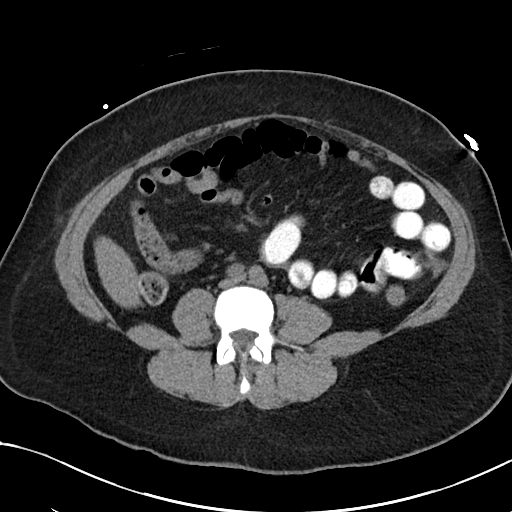
[im 52/93  soft-tissue]
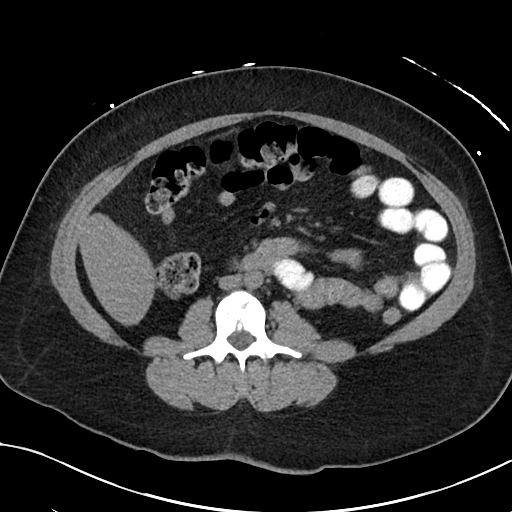
[im 62/93  soft-tissue]
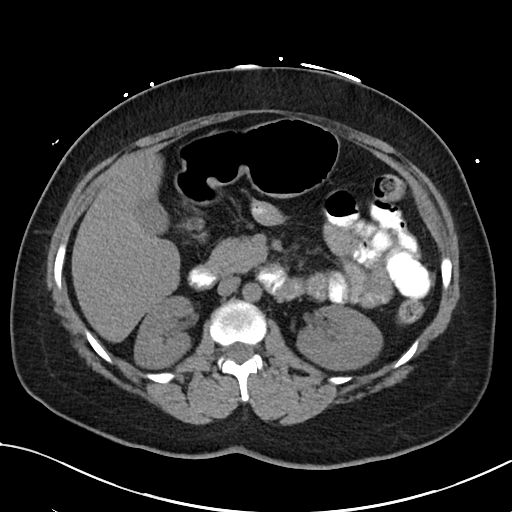
[im 62/93  bone]
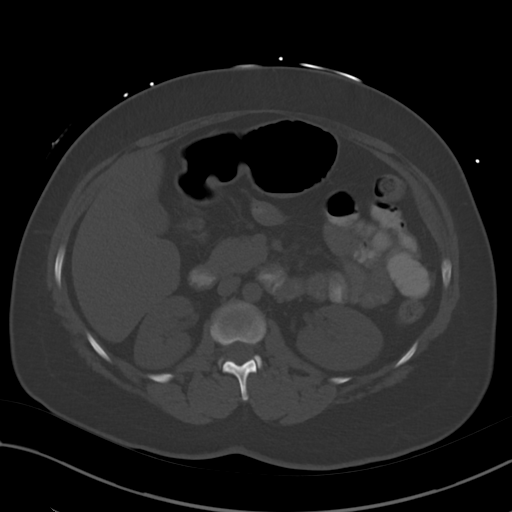
[im 67/93  soft-tissue]
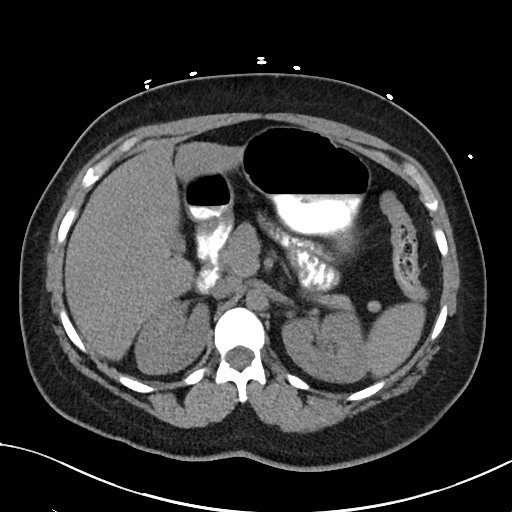
[im 72/93  soft-tissue]
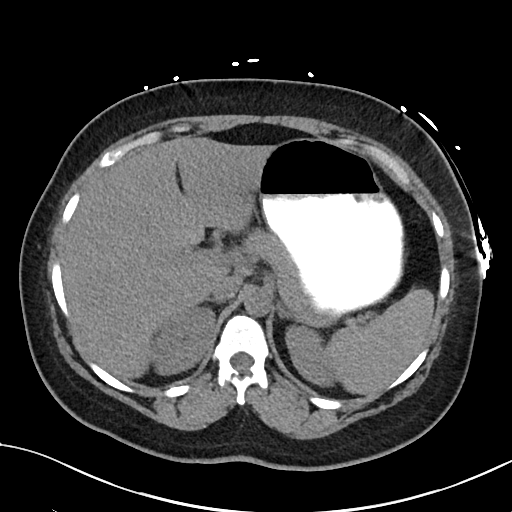
[im 82/93  soft-tissue]
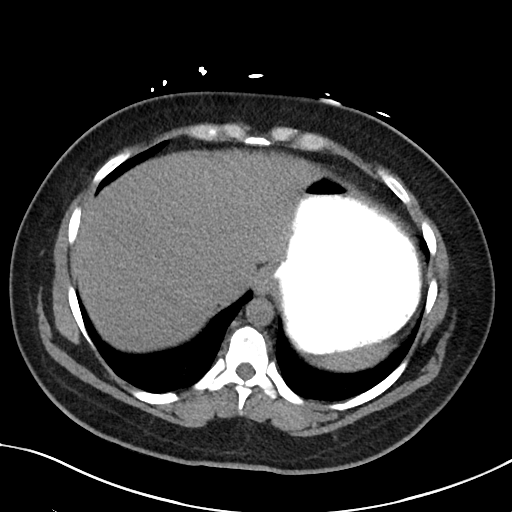
[im 87/93  soft-tissue]
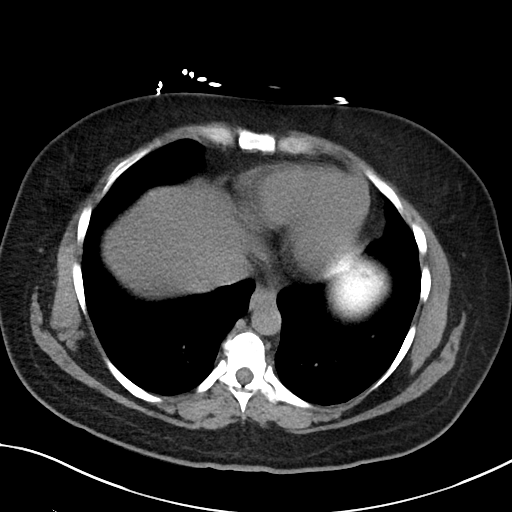

[Series 6: cor · coronal · 0.72mm/px · 3 of 100 slices shown]
[im 34/100  soft-tissue]
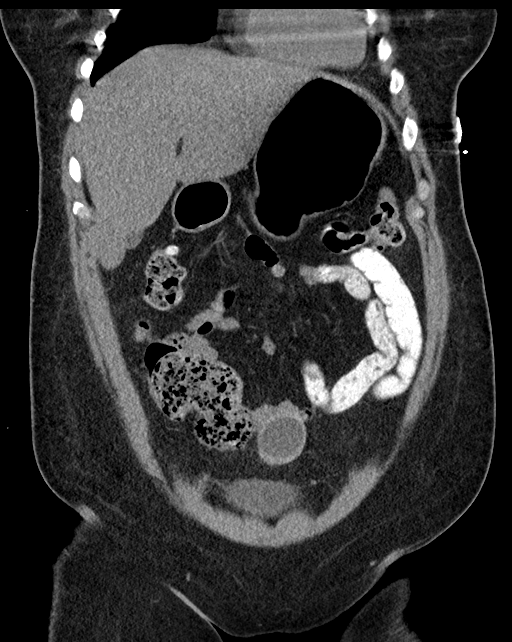
[im 45/100  soft-tissue]
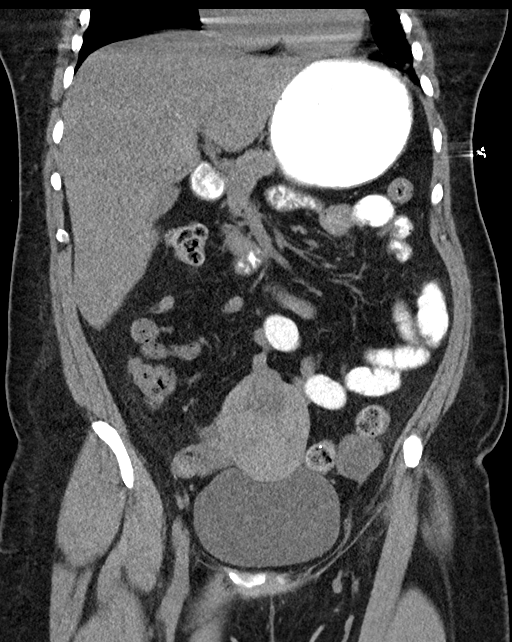
[im 56/100  soft-tissue]
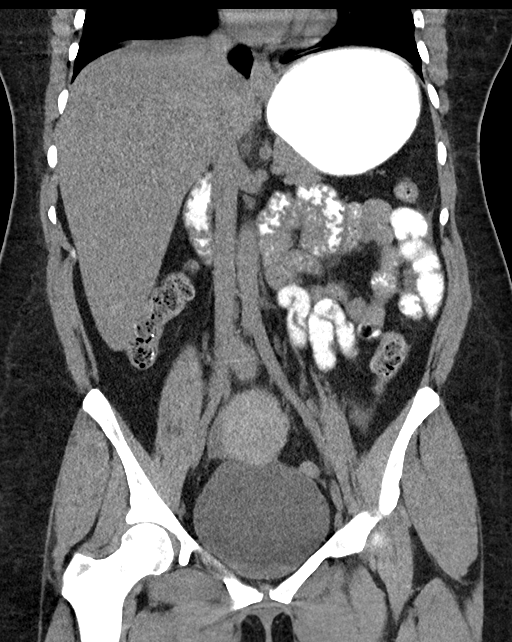

[16 of 46 positions shown; findings below may reference images not displayed]

FINDINGS: Lower chest: No acute findings.

Hepatobiliary: No mass visualized on this unenhanced exam.
Gallbladder is unremarkable.

Pancreas: No mass or inflammatory process visualized on this
unenhanced exam.

Spleen:  Within normal limits in size.

Adrenals/Urinary tract: No evidence of urolithiasis or
hydronephrosis. Unremarkable unopacified urinary bladder.

Stomach/Bowel: No evidence of obstruction, inflammatory process, or
abnormal fluid collections. Although the appendix is not directly
visualized, no inflammatory process seen in region of the cecum or
elsewhere.

Vascular/Lymphatic: No pathologically enlarged lymph nodes
identified. No evidence of abdominal aortic aneurysm.

Reproductive: Several uterine fibroids are seen, largest measuring
3.6 cm. No adnexal mass identified. Tiny amount of free fluid in the
pelvic cul-de-sac is likely physiologic.

Other:  None.

Musculoskeletal:  No suspicious bone lesions identified.
IMPRESSION: Small amount of free fluid in pelvic cul-de-sac, most likely
physiologic. No evidence of appendicitis or other acute findings.

Several small uterine fibroids, largest measuring 3.6 cm.

## 2017-09-19 SURGERY — LAPAROSCOPY, DIAGNOSTIC
Anesthesia: General | Site: Abdomen

## 2017-09-19 MED ORDER — SUCCINYLCHOLINE CHLORIDE 200 MG/10ML IV SOSY
PREFILLED_SYRINGE | INTRAVENOUS | Status: AC
  Filled 2017-09-19: qty 10

## 2017-09-19 MED ORDER — FENTANYL CITRATE (PF) 100 MCG/2ML IJ SOLN
INTRAMUSCULAR | Status: DC | PRN
  Administered 2017-09-19 (×7): 50 ug via INTRAVENOUS

## 2017-09-19 MED ORDER — FENTANYL CITRATE (PF) 250 MCG/5ML IJ SOLN
INTRAMUSCULAR | Status: AC
  Filled 2017-09-19: qty 5

## 2017-09-19 MED ORDER — FAMOTIDINE IN NACL 20-0.9 MG/50ML-% IV SOLN
20.0000 mg | Freq: Two times a day (BID) | INTRAVENOUS | Status: DC
  Administered 2017-09-20: 20 mg via INTRAVENOUS
  Filled 2017-09-19 (×2): qty 50

## 2017-09-19 MED ORDER — ONDANSETRON HCL 4 MG PO TABS: 4.0000 mg | ORAL_TABLET | Freq: Four times a day (QID) | ORAL | Status: DC | PRN

## 2017-09-19 MED ORDER — ONDANSETRON HCL 4 MG/2ML IJ SOLN
INTRAMUSCULAR | Status: AC
  Filled 2017-09-19: qty 2

## 2017-09-19 MED ORDER — ACETAMINOPHEN 10 MG/ML IV SOLN: 1000.0000 mg | Freq: Once | INTRAVENOUS | Status: DC | PRN

## 2017-09-19 MED ORDER — SUGAMMADEX SODIUM 200 MG/2ML IV SOLN
INTRAVENOUS | Status: AC
  Filled 2017-09-19: qty 2

## 2017-09-19 MED ORDER — MORPHINE SULFATE (PF) 4 MG/ML IV SOLN
4.0000 mg | Freq: Once | INTRAVENOUS | Status: AC
  Administered 2017-09-19: 4 mg via INTRAVENOUS
  Filled 2017-09-19: qty 1

## 2017-09-19 MED ORDER — ALUM & MAG HYDROXIDE-SIMETH 200-200-20 MG/5ML PO SUSP: 30.0000 mL | ORAL | Status: DC | PRN

## 2017-09-19 MED ORDER — BUPIVACAINE HCL (PF) 0.25 % IJ SOLN
INTRAMUSCULAR | Status: AC
  Filled 2017-09-19: qty 30

## 2017-09-19 MED ORDER — BUPIVACAINE HCL (PF) 0.25 % IJ SOLN
INTRAMUSCULAR | Status: DC | PRN
  Administered 2017-09-19: 7 mL

## 2017-09-19 MED ORDER — LIDOCAINE HCL (CARDIAC) PF 100 MG/5ML IV SOSY
PREFILLED_SYRINGE | INTRAVENOUS | Status: DC | PRN
  Administered 2017-09-19: 70 mg via INTRAVENOUS

## 2017-09-19 MED ORDER — HYDROCODONE-ACETAMINOPHEN 7.5-325 MG PO TABS: 1.0000 | ORAL_TABLET | Freq: Once | ORAL | Status: DC | PRN

## 2017-09-19 MED ORDER — VERAPAMIL HCL ER 240 MG PO TBCR
240.0000 mg | EXTENDED_RELEASE_TABLET | Freq: Every day | ORAL | Status: DC
  Administered 2017-09-20: 240 mg via ORAL
  Filled 2017-09-19: qty 1

## 2017-09-19 MED ORDER — ROCURONIUM BROMIDE 100 MG/10ML IV SOLN
INTRAVENOUS | Status: AC
  Filled 2017-09-19: qty 1

## 2017-09-19 MED ORDER — OXYCODONE-ACETAMINOPHEN 5-325 MG PO TABS
2.0000 | ORAL_TABLET | ORAL | Status: DC | PRN
  Administered 2017-09-20 (×2): 2 via ORAL
  Filled 2017-09-19 (×2): qty 2

## 2017-09-19 MED ORDER — ACETAMINOPHEN 10 MG/ML IV SOLN
INTRAVENOUS | Status: AC
  Filled 2017-09-19: qty 100

## 2017-09-19 MED ORDER — PROMETHAZINE HCL 25 MG PO TABS: 25.0000 mg | ORAL_TABLET | Freq: Four times a day (QID) | ORAL | 0 refills | Status: DC | PRN

## 2017-09-19 MED ORDER — MIDAZOLAM HCL 2 MG/2ML IJ SOLN
INTRAMUSCULAR | Status: DC | PRN
  Administered 2017-09-19: 2 mg via INTRAVENOUS

## 2017-09-19 MED ORDER — SCOPOLAMINE 1 MG/3DAYS TD PT72
MEDICATED_PATCH | TRANSDERMAL | Status: AC
  Filled 2017-09-19: qty 1

## 2017-09-19 MED ORDER — ACETAMINOPHEN 325 MG PO TABS: 650.0000 mg | ORAL_TABLET | ORAL | Status: DC | PRN

## 2017-09-19 MED ORDER — DEXAMETHASONE SODIUM PHOSPHATE 4 MG/ML IJ SOLN
INTRAMUSCULAR | Status: AC
  Filled 2017-09-19: qty 1

## 2017-09-19 MED ORDER — BARIUM SULFATE 2.1 % PO SUSP
ORAL | Status: AC
  Filled 2017-09-19: qty 2

## 2017-09-19 MED ORDER — MENTHOL 3 MG MT LOZG: 1.0000 | LOZENGE | OROMUCOSAL | Status: DC | PRN

## 2017-09-19 MED ORDER — MIDAZOLAM HCL 2 MG/2ML IJ SOLN
INTRAMUSCULAR | Status: AC
  Filled 2017-09-19: qty 2

## 2017-09-19 MED ORDER — SUMATRIPTAN SUCCINATE 100 MG PO TABS
100.0000 mg | ORAL_TABLET | Freq: Once | ORAL | Status: DC | PRN
  Filled 2017-09-19: qty 1

## 2017-09-19 MED ORDER — LACTATED RINGERS IV SOLN
INTRAVENOUS | Status: DC
  Administered 2017-09-19 (×3): via INTRAVENOUS

## 2017-09-19 MED ORDER — HYDROMORPHONE HCL 1 MG/ML IJ SOLN
0.2000 mg | INTRAMUSCULAR | Status: DC | PRN
  Administered 2017-09-20: 0.6 mg via INTRAVENOUS
  Filled 2017-09-19: qty 1

## 2017-09-19 MED ORDER — MEPERIDINE HCL 25 MG/ML IJ SOLN: 6.2500 mg | INTRAMUSCULAR | Status: DC | PRN

## 2017-09-19 MED ORDER — FLUTICASONE PROPIONATE 50 MCG/ACT NA SUSP
1.0000 | Freq: Every day | NASAL | Status: DC
  Filled 2017-09-19: qty 16

## 2017-09-19 MED ORDER — LIDOCAINE HCL (CARDIAC) PF 100 MG/5ML IV SOSY
PREFILLED_SYRINGE | INTRAVENOUS | Status: AC
  Filled 2017-09-19: qty 5

## 2017-09-19 MED ORDER — HYDROMORPHONE HCL 1 MG/ML IJ SOLN
0.2500 mg | INTRAMUSCULAR | Status: DC | PRN
  Administered 2017-09-19: 0.5 mg via INTRAVENOUS

## 2017-09-19 MED ORDER — KCL IN DEXTROSE-NACL 20-5-0.45 MEQ/L-%-% IV SOLN
INTRAVENOUS | Status: DC
  Administered 2017-09-20: 01:00:00 via INTRAVENOUS
  Filled 2017-09-19 (×2): qty 1000

## 2017-09-19 MED ORDER — SODIUM CHLORIDE 0.9 % IR SOLN
Status: DC | PRN
  Administered 2017-09-19: 3000 mL

## 2017-09-19 MED ORDER — DOCUSATE SODIUM 100 MG PO CAPS
100.0000 mg | ORAL_CAPSULE | Freq: Two times a day (BID) | ORAL | Status: DC
  Administered 2017-09-20 (×2): 100 mg via ORAL
  Filled 2017-09-19 (×2): qty 1

## 2017-09-19 MED ORDER — DEXAMETHASONE SODIUM PHOSPHATE 4 MG/ML IJ SOLN
INTRAMUSCULAR | Status: DC | PRN
  Administered 2017-09-19: 4 mg via INTRAVENOUS

## 2017-09-19 MED ORDER — SUCCINYLCHOLINE CHLORIDE 20 MG/ML IJ SOLN
INTRAMUSCULAR | Status: DC | PRN
  Administered 2017-09-19: 100 mg via INTRAVENOUS

## 2017-09-19 MED ORDER — CLONAZEPAM 0.5 MG PO TABS: 0.5000 mg | ORAL_TABLET | Freq: Two times a day (BID) | ORAL | Status: DC | PRN

## 2017-09-19 MED ORDER — PROMETHAZINE HCL 25 MG/ML IJ SOLN: 6.2500 mg | INTRAMUSCULAR | Status: DC | PRN

## 2017-09-19 MED ORDER — ACETAMINOPHEN 10 MG/ML IV SOLN
INTRAVENOUS | Status: DC | PRN
  Administered 2017-09-19: 1000 mg via INTRAVENOUS

## 2017-09-19 MED ORDER — PROPOFOL 10 MG/ML IV BOLUS
INTRAVENOUS | Status: AC
  Filled 2017-09-19: qty 20

## 2017-09-19 MED ORDER — ONDANSETRON HCL 4 MG/2ML IJ SOLN: 4.0000 mg | Freq: Four times a day (QID) | INTRAMUSCULAR | Status: DC | PRN

## 2017-09-19 MED ORDER — OXYCODONE-ACETAMINOPHEN 5-325 MG PO TABS: 1.0000 | ORAL_TABLET | ORAL | Status: DC | PRN

## 2017-09-19 MED ORDER — HYDROMORPHONE HCL 1 MG/ML IJ SOLN
INTRAMUSCULAR | Status: AC
  Filled 2017-09-19: qty 1

## 2017-09-19 MED ORDER — ONDANSETRON HCL 4 MG/2ML IJ SOLN
INTRAMUSCULAR | Status: DC | PRN
  Administered 2017-09-19: 4 mg via INTRAVENOUS

## 2017-09-19 MED ORDER — ROCURONIUM BROMIDE 100 MG/10ML IV SOLN
INTRAVENOUS | Status: DC | PRN
  Administered 2017-09-19: 10 mg via INTRAVENOUS
  Administered 2017-09-19: 30 mg via INTRAVENOUS
  Administered 2017-09-19 (×2): 5 mg via INTRAVENOUS

## 2017-09-19 MED ORDER — PROPOFOL 10 MG/ML IV BOLUS
INTRAVENOUS | Status: DC | PRN
  Administered 2017-09-19: 150 mg via INTRAVENOUS

## 2017-09-19 MED ORDER — SCOPOLAMINE 1 MG/3DAYS TD PT72
MEDICATED_PATCH | TRANSDERMAL | Status: DC | PRN
  Administered 2017-09-19: 1 via TRANSDERMAL

## 2017-09-19 MED ORDER — SUGAMMADEX SODIUM 500 MG/5ML IV SOLN
INTRAVENOUS | Status: DC | PRN
  Administered 2017-09-19: 200 mg via INTRAVENOUS

## 2017-09-19 MED ORDER — ONDANSETRON HCL 4 MG/2ML IJ SOLN
4.0000 mg | Freq: Once | INTRAMUSCULAR | Status: AC
  Administered 2017-09-19: 4 mg via INTRAVENOUS
  Filled 2017-09-19: qty 2

## 2017-09-19 SURGICAL SUPPLY — 40 items
ADH SKN CLS APL DERMABOND .7 (GAUZE/BANDAGES/DRESSINGS) ×2
APL SRG 38 LTWT LNG FL B (MISCELLANEOUS) ×2
APPLICATOR ARISTA FLEXITIP XL (MISCELLANEOUS) ×2 IMPLANT
CABLE HIGH FREQUENCY MONO STRZ (ELECTRODE) IMPLANT
CANISTER SUCT 3000ML PPV (MISCELLANEOUS) ×3 IMPLANT
DERMABOND ADVANCED (GAUZE/BANDAGES/DRESSINGS) ×1
DERMABOND ADVANCED .7 DNX12 (GAUZE/BANDAGES/DRESSINGS) ×1 IMPLANT
DRSG OPSITE POSTOP 3X4 (GAUZE/BANDAGES/DRESSINGS) ×2 IMPLANT
DURAPREP 26ML APPLICATOR (WOUND CARE) ×3 IMPLANT
GLOVE BIOGEL PI IND STRL 7.0 (GLOVE) ×6 IMPLANT
GLOVE BIOGEL PI INDICATOR 7.0 (GLOVE) ×3
GLOVE ECLIPSE 6.5 STRL STRAW (GLOVE) ×6 IMPLANT
GOWN STRL REUS W/TWL LRG LVL3 (GOWN DISPOSABLE) ×6 IMPLANT
HEMOSTAT ARISTA ABSORB 3G PWDR (MISCELLANEOUS) ×2 IMPLANT
LIGASURE VESSEL 5MM BLUNT TIP (ELECTROSURGICAL) ×2 IMPLANT
NEEDLE INSUFFLATION 120MM (ENDOMECHANICALS) ×3 IMPLANT
NS IRRIG 1000ML POUR BTL (IV SOLUTION) ×3 IMPLANT
PACK LAPAROSCOPY BASIN (CUSTOM PROCEDURE TRAY) ×3 IMPLANT
PACK TRENDGUARD 450 HYBRID PRO (MISCELLANEOUS) IMPLANT
POUCH ENDO CATCH II 15MM (MISCELLANEOUS) ×2 IMPLANT
POUCH LAPAROSCOPIC INSTRUMENT (MISCELLANEOUS) ×3 IMPLANT
PROTECTOR NERVE ULNAR (MISCELLANEOUS) ×2 IMPLANT
SCISSORS LAP 5X35 DISP (ENDOMECHANICALS) IMPLANT
SET CYSTO W/LG BORE CLAMP LF (SET/KITS/TRAYS/PACK) IMPLANT
SET IRRIG TUBING LAPAROSCOPIC (IRRIGATION / IRRIGATOR) ×2 IMPLANT
SHEARS HARMONIC ACE PLUS 36CM (ENDOMECHANICALS) IMPLANT
SLEEVE ADV FIXATION 5X100MM (TROCAR) IMPLANT
SUT VICRYL 0 UR6 27IN ABS (SUTURE) ×11 IMPLANT
SUT VICRYL 4-0 PS2 18IN ABS (SUTURE) ×5 IMPLANT
SYSTEM CARTER THOMASON II (TROCAR) ×2 IMPLANT
TOWEL OR 17X24 6PK STRL BLUE (TOWEL DISPOSABLE) ×6 IMPLANT
TRAY FOLEY W/BAG SLVR 14FR (SET/KITS/TRAYS/PACK) ×3 IMPLANT
TRENDGUARD 450 HYBRID PRO PACK (MISCELLANEOUS)
TROCAR ADV FIXATION 5X100MM (TROCAR) IMPLANT
TROCAR BLADELESS 15MM (ENDOMECHANICALS) ×2 IMPLANT
TROCAR XCEL NON-BLD 11X100MML (ENDOMECHANICALS) IMPLANT
TROCAR XCEL NON-BLD 5MMX100MML (ENDOMECHANICALS) ×2 IMPLANT
TROCAR XCEL OPT SLVE 5M 100M (ENDOMECHANICALS) ×2 IMPLANT
TUBING INSUF HEATED (TUBING) ×4 IMPLANT
WARMER LAPAROSCOPE (MISCELLANEOUS) ×3 IMPLANT

## 2017-09-19 NOTE — H&P (Signed)
42 y.o. [id number] Single female presented to the ER this morning with 10/10 severe RLQ abdominal pain. The patient is s/p TLH/BS of a large fibroid uterus on 07/31/17. Since her surgery she reports several episodes of lower abdominal pain, none as severe at today. She had associated nausea and emesis. She is currently rating her pain as a 4-5/10 s/p morphine.   Patient's last menstrual period was 05/02/2017.          Sexually active: Yes.    The current method of family planning is status post hysterectomy.        reports that she has been smoking e-cigarettes and cigarettes.  She has smoked for the past 20.00 years. She has never used smokeless tobacco. She reports that she drinks alcohol. She reports that she does not use drugs.  Past Medical History:  Diagnosis Date  . Anemia   . Anxiety   . Blind right eye    swelling & pressure  . Cluster headaches    started after seizure  . Depression   . Dysmenorrhea   . Fibroids   . GERD (gastroesophageal reflux disease)    patient thinks due to medications  . History of pneumonia   . History of trichomoniasis   . Menorrhagia   . Migraines   . Pneumonia 07/05/2017  . Seizures ([place])    once saw neurologist, full body brain MRI and another 6 months lster  Thrombocytopenia, saw hematology in 5/19. Previously Plts down to 71K. 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   Past Surgical History:  Procedure Laterality Date  . ABDOMINAL HYSTERECTOMY    . ABLATION    . COLPOSCOPY    . CYSTOSCOPY N/A 07/31/2017   Procedure: CYSTOSCOPY;  Surgeon: Megan Salon, MD;  Location: [facility];  Service: Gynecology;  Laterality: N/A;  . EYE SURGERY     removed blood from right eye  . HYSTEROSCOPY W/D&C    . TOTAL LAPAROSCOPIC HYSTERECTOMY WITH SALPINGECTOMY Bilateral 07/31/2017   Procedure: TOTAL LAPAROSCOPIC HYSTERECTOMY WITH SALPINGECTOMY;  Surgeon: Megan Salon, MD;  Location: [facility];  Service: Gynecology;  Laterality: Bilateral;  20  week size uterus/ Alexis bag in room/ need 4.5 hours  . TUBAL LIGATION    . WISDOM TOOTH EXTRACTION      Current Facility-Administered Medications  Medication Dose Route Frequency Provider Last Rate Last Dose  . Barium Sulfate 2.1 % SUSP            Current Outpatient Medications  Medication Sig Dispense Refill  . acetaminophen-codeine (TYLENOL #3) 300-30 MG tablet Take 1-2 tablets by mouth every 4 (four) hours as needed for moderate pain or severe pain (for post op pain). 30 tablet 0  . atropine 1 % ophthalmic solution Place 1 drop into the right eye 3 (three) times daily.     . brimonidine (ALPHAGAN P) 0.1 % SOLN Place 1 drop into the right eye every 8 (eight) hours.     . calcium carbonate (TUMS EX) 750 MG chewable tablet Chew 2 tablets by mouth as needed for heartburn.    . clonazePAM (KLONOPIN) 0.5 MG tablet Take 1 tablet (0.5 mg total) by mouth 2 (two) times daily as needed. for anxiety (Patient taking differently: Take 0.5 mg by mouth daily. for anxiety) 180 tablet 1  . Cyanocobalamin (VITAMIN B-12 PO) Take 1 tablet by mouth daily.     . dorzolamide-timolol (COSOPT) 22.3-6.8 MG/ML ophthalmic solution Place 1 drop into the right eye 2 (two) times daily.     [name]  fluticasone (FLONASE) 50 MCG/ACT nasal spray Place 1-2 sprays into both nostrils daily.    [name] latanoprost (XALATAN) 0.005 % ophthalmic solution Place 1 drop into both eyes at bedtime.    . Multiple Vitamin (MULTI-VITAMINS) TABS Take 1 tablet by mouth daily.     . SUMAtriptan (IMITREX) 100 MG tablet Take 1 tablet (100 mg total) by mouth once as needed for migraine. May repeat in 2 hours if headache persists or recurs. 18 tablet 3  . verapamil (CALAN-SR) 240 MG CR tablet Take 1 tablet (240 mg total) by mouth at bedtime. 90 tablet 3  . promethazine (PHENERGAN) 25 MG tablet Take 1 tablet (25 mg total) by mouth every 6 (six) hours as needed for nausea. 20 tablet 0   Facility-Administered Medications Ordered in Other Encounters   Medication Dose Route Frequency Provider Last Rate Last Dose  . gadopentetate dimeglumine (MAGNEVIST) injection 16 mL  16 mL Intravenous Once PRN Sater, Nanine Means, MD        Family History  Problem Relation Age of Onset  . Alcoholism Father   . Cirrhosis Father   . Cancer Maternal Grandmother        unsure of type    Review of Systems  Exam:   BP 112/69   Pulse 70   Temp 98.2 F (36.8 C) (Oral)   Resp 13   LMP 05/02/2017 Comment: bleeds daily  SpO2 96%   Weight change: Weight change:  Height:   @FLOWAMB (11)@  Ht Readings from Last 3 Encounters:  09/10/17 5' (1.524 m)  08/16/17 5' (1.524 m)  08/07/17 5' 0.25" (1.53 m)    General appearance: alert, cooperative and appears stated age Head: Normocephalic, without obvious abnormality, atraumatic Lungs: clear to auscultation bilaterally Cardiovascular: regular rate and rhythm Abdomen: soft, very tender BLQ, R>L, some voluntary guarding, +/-rebound Extremities: extremities normal, atraumatic, no cyanosis or edema Skin: Skin color, texture, turgor normal. No rashes or lesions Lymph nodes: Cervical, supraclavicular, and axillary nodes normal. No abnormal inguinal nodes palpated Neurologic: Grossly normal Pelvic: deferred, done by ER provider  Lab Results  Component Value Date   WBC 9.9 09/19/2017   HGB 13.7 09/19/2017   HCT 40.8 09/19/2017   MCV 88.9 09/19/2017   PLT 71 (L) 09/19/2017   CMP normal other than glucose of 131  CT:  ADDENDUM REPORT: 09/19/2017 14:37  ADDENDUM: Given the patient's recent hysterectomy, the soft tissue density in the central pelvis cannot represent a fibroid uterus. This measures 9.4 x 7.0 cm, and contains several internal low-attenuation cystic areas. This is suspicious for a swollen right ovary due to ovarian torsion, with ovarian neoplasm considered less likely.  Recommend clinical correlation, and consider pelvic ultrasound with Doppler for further evaluation. This was discussed  with the patient's caretaker in the ED, Domenic Moras, at the time of this addendum at 1435 hours on 09/19/2017.   Electronically Signed   By: Earle Gell M.D.   On: 09/19/2017 14:37   Addended by Earle Gell, MD on 09/19/2017 2:40 PM    Study Result   CLINICAL DATA:  Right lower quadrant pain and nausea. Suspected appendicitis. Approximately 2 months status post hysterectomy.  EXAM: CT ABDOMEN AND PELVIS WITHOUT CONTRAST  TECHNIQUE: Multidetector CT imaging of the abdomen and pelvis was performed following the standard protocol without IV contrast.  COMPARISON:  None.  FINDINGS: Lower chest: No acute findings.  Hepatobiliary: No mass visualized on this unenhanced exam. Gallbladder is unremarkable.  Pancreas: No mass or inflammatory process visualized  on this unenhanced exam.  Spleen:  Within normal limits in size.  Adrenals/Urinary tract: No evidence of urolithiasis or hydronephrosis. Unremarkable unopacified urinary bladder.  Stomach/Bowel: No evidence of obstruction, inflammatory process, or abnormal fluid collections. Although the appendix is not directly visualized, no inflammatory process seen in region of the cecum or elsewhere.  Vascular/Lymphatic: No pathologically enlarged lymph nodes identified. No evidence of abdominal aortic aneurysm.  Reproductive: Several uterine fibroids are seen, largest measuring 3.6 cm. No adnexal mass identified. Tiny amount of free fluid in the pelvic cul-de-sac is likely physiologic.  Other:  None.  Musculoskeletal:  No suspicious bone lesions identified.  IMPRESSION: Small amount of free fluid in pelvic cul-de-sac, most likely physiologic. No evidence of appendicitis or other acute findings.  Several small uterine fibroids, largest measuring 3.6 cm.  Electronically Signed: By: Earle Gell M.D. On: 09/19/2017 13:14        Ultrasound FINDINGS: Uterus  Status post  hysterectomy.  Endometrium  Status post hysterectomy.  Right ovary  Measurements: 8.8 x 7.5 x 7.4 cm. There are multiple cysts measuring up to 3.8 x 3.5 x 2.5 cm. The ovary is markedly enlarged with increased echogenicity consistent with edema.  Left ovary  Measurements: 4.6 x 3.0 x 3.2 cm. Normal appearance/no adnexal mass.  Pulsed Doppler evaluation of both ovaries demonstrates normal low-resistance arterial and venous waveforms.  Other findings  Small amount of free fluid in the posterior cul-de-sac.  IMPRESSION: Enlarged, edematous right ovary position in the abdominal midline is suggestive of ovarian torsion, despite maintained arterial and venous waveforms. Intermittent torsion/detorsion is possible.  These results were discussed by telephone at the time of interpretation on 09/19/2017 at 5:29 pm with Dr. Sumner Boast, who verbally acknowledged these results.   Electronically Signed   By: Ulyses Jarred M.D.   On: 09/19/2017 17:45    A:  42 year old female 1.5 months s/p TLH with severe abdominal pelvic pain and imaging studies c/w a torsed ovary  Thrombocytopenia, not a new finding. Will recheck stat CBC now to confirm it isn't dropping.   P:   Laparoscopy with detorsion and likely removal of the right ovary. Discussed the risks, including infection, damage to bowel, bladder, vessels, ureters.

## 2017-09-19 NOTE — Telephone Encounter (Signed)
Spoke with patient. Patient currently at [facility] ED for evaluation of abdominal pain, has additional questions regarding CT scan results dated 09/19/17. Advised Dr. Sabra Heck is out of the office today, will review with nursing supervisor and covering provider and return call, patient agreeable.   Routing to S. Orvan Seen, RN  Cc: Dr. Talbert Nan, Dr. Sabra Heck

## 2017-09-19 NOTE — Op Note (Signed)
Preoperative Diagnosis: Right ovarian torsion  Postoperative Diagnosis: same, abdominal and pelvic adhesions  Procedure:  Diagnostic laparoscopy, lysis of adhesions, right oophorectomy  Surgeon: Dr Sumner Boast  Assistant: Dr Josefa Half  Anesthesia: General  EBL: 100cc  Fluids: 1,600 cc LR  Urine output: 867 cc  Complications: none  Indications for surgery: The patient is a 42 year old female, who presented 6 weeks post operatively from a total laparoscopic hysterectomy and bilateral salpingectomy with severe abdominal pelvic pain. Work up included an initially normal WBC, later in the day up to 14. CT with a pelvic mass, ultrasound with a 9 cm enlarged ovary concerning for torsion.The patient is aware of the risks and complications involved with the surgery and consent was obtained prior to the procedure.  Findings: EUA: mobile pelvic mass. Laparoscopy: large hemorrhagic right ovary, torsed many times, necrotic appearing. Adhesions of the omentum to the anterior abdominal wall in the area around and above the umbilicus. There were also adhesions of the bowel epiploica to the vaginal cuff. There was blood in the pelvis on entry.   Procedure: The patient was taken to the operating room with an IV in placed. She was placed in the dorsal lithotomy position. General anesthesia was administered. She was prepped and draped in the usual sterile fashion for an abdominal, vaginal surgery. A sponge stick was placed in the vagina. A foley catheter was placed.   The umbilicus was everted, injected with 0.25% marcaine and incised with a # 11 blade. 2 towel clips were used to elevated the umbilicus and a veress needle was placed into the abdominal cavity. The abdominal cavity was insufflated with CO2, with normal intraabdominal pressures. After adequate pneumo-insufflation the veress needle was removed and the 5 mm laparoscope was placed into the abdominal cavity using the opti-view trocar. It was  apparent that I went through some omental adhesions.  The patient was placed in trendelenburg and the abdominal pelvic cavity was inspected. 2 more trocars were placed. One trocar was placed in the mid lateral abdomen on each side in the area of prior laparoscopic entry These areas were injected with 0.25% marcaine, incised with a #11 blades. A 5 mm trocar was inserted in the right lateral abdomen and an 11 mm trocar in the left lateral abdomen. Both were inserted with direct visualization with the laparoscope. The abdominal pelvic cavity was again inspected. The ureters was identified on the right.   The adhesions of the omentum to the anterior abdominal wall were taken down with the ligasure device.  The right ovary was detorsed and felt to not be viable. The right infundibulopelvic ligament was cauterized and cut with the ligasure device. The ovary was too large to fit into a #10 endo bag. The #11 trocar was removed, the skin incision was extended and a #15 trocar was inserted. The ovary was placed in the #15 endo bag, the trocar was removed and the bag was brought partially through the incision. The ovary was full of firm clot, it took approximately an hour to remove the right ovary from within the bag. The bag remained intact. The fascial incision was extended during this time.   The fascial incision was closed with 0-Vicryl in a running fashion.   The abdominal pelvic cavity was irrigated and suctioned dry. Pressure was released and hemostasis remained excellent. The adhesions of the bowel epiploica to the vaginal cuff were taken down bluntly. Arista was placed over the right pelvic side wall and vaginal cuff.  The abdominal cavity was desufflated and the trocars were removed. The skin was closed with subcuticular stiches of 4-0 vicryl and dermabond was placed over the incisions.  The foley catheter was removed.   The patient's abdomen and perineum were cleansed and she was taken out of the  dorsal lithotomy position. Upon awakening she was extubated and taken to the recovery room in stable condition. The sponge and instrument counts were correct.   CC: Dr Edwinna Areola

## 2017-09-19 NOTE — Anesthesia Preprocedure Evaluation (Addendum)
Anesthesia Evaluation  Patient identified by MRN, date of birth, ID band Patient awake    Reviewed: Allergy & Precautions, NPO status , Patient's Chart, lab work & pertinent test results  Airway Mallampati: III  TM Distance: >3 FB Neck ROM: Full    Dental no notable dental hx. (+) Teeth Intact, Dental Advisory Given   Pulmonary Current Smoker,    Pulmonary exam normal breath sounds clear to auscultation       Cardiovascular negative cardio ROS Normal cardiovascular exam Rhythm:Regular Rate:Normal     Neuro/Psych  Headaches, Seizures -,  PSYCHIATRIC DISORDERS Anxiety Depression Blind right eye Pre-op communication with neurology (Dr. Felecia Shelling)        GI/Hepatic Neg liver ROS, GERD  ,  Endo/Other  negative endocrine ROS  Renal/GU negative Renal ROS     Musculoskeletal negative musculoskeletal ROS (+)   Abdominal (+) + obese,   Peds  Hematology  (+) anemia , Thrombocytopenia  Surgical clearance was obtained from hematology (Dr. Irene Limbo)       Anesthesia Other Findings Irregular uterine bleeding, fibroid uterus unresponsive to Lupron, anemia  Reproductive/Obstetrics hcg negative                             Lab Results  Component Value Date   CREATININE 0.70 09/19/2017   BUN 7 09/19/2017   NA 138 09/19/2017   K 3.9 09/19/2017   CL 103 09/19/2017   CO2 25 09/19/2017    Lab Results  Component Value Date   WBC 14.2 (H) 09/19/2017   HGB 13.0 09/19/2017   HCT 38.3 09/19/2017   MCV 88.9 09/19/2017   PLT 67 (L) 09/19/2017    Anesthesia Physical Anesthesia Plan  ASA: III and emergent  Anesthesia Plan: General   Post-op Pain Management:    Induction: Intravenous, Cricoid pressure planned and Rapid sequence  PONV Risk Score and Plan: Treatment may vary due to age or medical condition, Ondansetron and Scopolamine patch - Pre-op  Airway Management Planned: Oral ETT  Additional  Equipment:   Intra-op Plan:   Post-operative Plan: Extubation in OR  Informed Consent: I have reviewed the patients History and Physical, chart, labs and discussed the procedure including the risks, benefits and alternatives for the proposed anesthesia with the patient or authorized representative who has indicated his/her understanding and acceptance.   Dental advisory given  Plan Discussed with: CRNA and Anesthesiologist  Anesthesia Plan Comments:        Anesthesia Quick Evaluation

## 2017-09-19 NOTE — ED Notes (Signed)
Pt finished drinking contrast.  CT notified.  Stated will need to wait till 1240.

## 2017-09-19 NOTE — Telephone Encounter (Signed)
Initial call from patient was at approximately 215, Call to Dr Kris Hartmann (radiologist) at [phone] at approximately 230 pm.  CT has been revised and amended report indicates possible torsed ovary.  Dr Kris Hartmann has reviewed with PA in ED.    Update provided to Dr Talbert Nan, on call MD.

## 2017-09-19 NOTE — Anesthesia Postprocedure Evaluation (Signed)
Anesthesia Post Note  Patient: Angel Rodriguez  Procedure(s) Performed: LAPAROSCOPY DIAGNOSTIC WITH RIGHT OOPHERECTOMY, LYSIS OF ADHESIONS (N/A Abdomen)     Patient location during evaluation: PACU Anesthesia Type: General Level of consciousness: awake and alert Pain management: pain level controlled Vital Signs Assessment: post-procedure vital signs reviewed and stable Respiratory status: spontaneous breathing, nonlabored ventilation, respiratory function stable and patient connected to nasal cannula oxygen Cardiovascular status: blood pressure returned to baseline and stable Postop Assessment: no apparent nausea or vomiting Anesthetic complications: no    Last Vitals:  Vitals:   09/19/17 2330 09/19/17 2342  BP: 112/75   Pulse: 77 78  Resp: 13 14  Temp:    SpO2: 92% 93%    Last Pain:  Vitals:   09/19/17 2330  TempSrc:   PainSc: Asleep   Pain Goal:                 Barnet Glasgow

## 2017-09-19 NOTE — Transfer of Care (Signed)
Immediate Anesthesia Transfer of Care Note  Patient: Angel Rodriguez  Procedure(s) Performed: LAPAROSCOPY DIAGNOSTIC WITH RIGHT OOPHERECTOMY, LYSIS OF ADHESIONS (N/A Abdomen)  Patient Location: PACU  Anesthesia Type:General  Level of Consciousness: awake, alert  and oriented  Airway & Oxygen Therapy: Patient Spontanous Breathing and Patient connected to nasal cannula oxygen  Post-op Assessment: Report given to RN and Post -op Vital signs reviewed and stable  Post vital signs: Reviewed and stable HR 90, RR 12, SaO2 100%, BP 130/80  Last Vitals:  Vitals Value Taken Time  BP 130/80 09/19/2017 10:50 PM  Temp    Pulse 93 09/19/2017 10:54 PM  Resp 22 09/19/2017 10:54 PM  SpO2 100 % 09/19/2017 10:54 PM  Vitals shown include unvalidated device data.  Last Pain:  Vitals:   09/19/17 1924  TempSrc: Oral  PainSc:          Complications: No apparent anesthesia complications

## 2017-09-19 NOTE — ED Provider Notes (Addendum)
[facility] EMERGENCY DEPARTMENT Provider Note   CSN: [id number] Arrival date & time: 09/19/17  1062     History   Chief Complaint Chief Complaint  Patient presents with  . Abdominal Pain    HPI Angel Rodriguez is a 42 y.o. female.  The history is provided by the patient. No language interpreter was used.  Abdominal Pain       42 year old female presenting for evaluation of lower abdominal pain.  Patient report at 1 AM this morning she was awoke with pain to her right lower abdomen.  Pain is sharp achy throbbing radiates to her back and shoulder suprapubic region, 10 out of 10 and has been persistent.  Pain is not adequately relieved using heat.  She endorsed nausea, vomited 3-4 episodes of nonbloody nonbilious content.  She had a normal bowel movement this morning.  No report of fever, chills, productive cough, dysuria, hematuria, vaginal bleeding or vaginal discharge.  She report having a hysterectomy on May 24.  She has had 2 similar lower abdominal discomfort episodes usually lasting for several hours and improves with heat.  Past Medical History:  Diagnosis Date  . Anemia   . Anxiety   . Blind right eye    swelling & pressure  . Cluster headaches    started after seizure  . Depression   . Dysmenorrhea   . Fibroids   . GERD (gastroesophageal reflux disease)    patient thinks due to medications  . History of pneumonia   . History of trichomoniasis   . Menorrhagia   . Migraines   . Pneumonia 07/05/2017  . Seizures ([place])    once saw neurologist, full body brain MRI and another 6 months lster    Patient Active Problem List   Diagnosis Date Noted  . Abnormal MRI of head 01/21/2016  . Anemia 01/12/2016  . Postpartum depression 01/12/2016  . Vascular headache 09/15/2015  . Insomnia 09/15/2015  . Headache 07/08/2015  . Headache, chronic migraine without aura, intractable 07/08/2015  . Anxiety state 07/08/2015  . Neck pain 06/24/2015  .  Convulsions ([place]) 06/24/2015  . Central retinal vein occlusion 12/11/2011  . Cotton wool exudates 12/11/2011  . Cystoid macular edema 12/11/2011    Past Surgical History:  Procedure Laterality Date  . ABDOMINAL HYSTERECTOMY    . ABLATION    . COLPOSCOPY    . CYSTOSCOPY N/A 07/31/2017   Procedure: CYSTOSCOPY;  Surgeon: Megan Salon, MD;  Location: [facility];  Service: Gynecology;  Laterality: N/A;  . EYE SURGERY     removed blood from right eye  . HYSTEROSCOPY W/D&C    . TOTAL LAPAROSCOPIC HYSTERECTOMY WITH SALPINGECTOMY Bilateral 07/31/2017   Procedure: TOTAL LAPAROSCOPIC HYSTERECTOMY WITH SALPINGECTOMY;  Surgeon: Megan Salon, MD;  Location: [facility];  Service: Gynecology;  Laterality: Bilateral;  20 week size uterus/ Alexis bag in room/ need 4.5 hours  . TUBAL LIGATION    . WISDOM TOOTH EXTRACTION       OB History    Gravida  5   Para  3   Term  3   Preterm      AB  2   Living  3     SAB  1   TAB      Ectopic  1   Multiple      Live Births               Home Medications    Prior to Admission medications  Medication Sig Start Date End Date Taking? Authorizing Provider  acetaminophen-codeine (TYLENOL #3) 300-30 MG tablet Take 1-2 tablets by mouth every 4 (four) hours as needed for moderate pain or severe pain (for post op pain). Patient not taking: Reported on 09/10/2017 08/07/17   Megan Salon, MD  atropine 1 % ophthalmic solution Place 1 drop into the right eye 3 (three) times daily.  01/10/16   [provider]  brimonidine (ALPHAGAN P) 0.1 % SOLN Place 1 drop into the right eye every 8 (eight) hours.  06/21/15   [provider]  calcium carbonate (TUMS EX) 750 MG chewable tablet Chew 2 tablets by mouth as needed for heartburn.    [provider]  clonazePAM (KLONOPIN) 0.5 MG tablet Take 1 tablet (0.5 mg total) by mouth 2 (two) times daily as needed. for anxiety 04/20/17   Sater, Nanine Means, MD  Cyanocobalamin (VITAMIN B-12 PO) Take 1  tablet by mouth daily.     [provider]  dorzolamide-timolol (COSOPT) 22.3-6.8 MG/ML ophthalmic solution Place 1 drop into the right eye 2 (two) times daily.  06/21/15   [provider]  fluticasone (FLONASE) 50 MCG/ACT nasal spray Place 1-2 sprays into both nostrils daily.    [provider]  latanoprost (XALATAN) 0.005 % ophthalmic solution Place 1 drop into both eyes at bedtime.    [provider]  Multiple Vitamin (MULTI-VITAMINS) TABS Take 1 tablet by mouth daily.     [provider]  SUMAtriptan (IMITREX) 100 MG tablet Take 1 tablet (100 mg total) by mouth once as needed for migraine. May repeat in 2 hours if headache persists or recurs. 04/20/17   Sater, Nanine Means, MD  verapamil (CALAN-SR) 240 MG CR tablet Take 1 tablet (240 mg total) by mouth at bedtime. 04/20/17   Sater, Nanine Means, MD    Family History Family History  Problem Relation Age of Onset  . Alcoholism Father   . Cirrhosis Father   . Cancer Maternal Grandmother        unsure of type    Social History Social History   Tobacco Use  . Smoking status: Current Some Day Smoker    Years: 20.00    Types: E-cigarettes, Cigarettes  . Smokeless tobacco: Never Used  . Tobacco comment: occasional cigerate and vap  Substance Use Topics  . Alcohol use: Yes    Comment: occasional  . Drug use: No     Allergies   Ivp dye [iodinated diagnostic agents] and Bee venom   Review of Systems Review of Systems  Gastrointestinal: Positive for abdominal pain.  All other systems reviewed and are negative.    Physical Exam Updated Vital Signs BP (!) 141/84 (BP Location: Left Arm)   Pulse 76   Temp 98.2 F (36.8 C) (Oral)   Resp 19   LMP 05/02/2017 Comment: bleeds daily  SpO2 98%   Physical Exam  Constitutional: She appears well-developed and well-nourished. No distress.  Obese female appears uncomfortable.  HENT:  Head: Atraumatic.  Eyes: Conjunctivae are normal.  Neck: Neck  supple.  Cardiovascular: Normal rate and regular rhythm.  Pulmonary/Chest: Effort normal and breath sounds normal.  Abdominal: Soft. Normal appearance. There is tenderness in the right lower quadrant. There is no tenderness at McBurney's point and negative Murphy's sign.  Genitourinary:  Genitourinary Comments: Chaperone present during exam.  No inguinal lymphadenopathy or inguinal hernia noted.  Normal external genitalia.  No significant discomfort with speculum insertion.  No significant discharge noted in  vaginal vault.  Cervix is absent due to prior hysterectomy.  No adnexal tenderness.  Neurological: She is alert.  Skin: No rash noted.  Psychiatric: She has a normal mood and affect.  Nursing note and vitals reviewed.    ED Treatments / Results  Labs (all labs ordered are listed, but only abnormal results are displayed) Labs Reviewed  WET PREP, GENITAL - Abnormal; Notable for the following components:      Result Value   WBC, Wet Prep HPF POC MANY (*)    All other components within normal limits  COMPREHENSIVE METABOLIC PANEL - Abnormal; Notable for the following components:   Glucose, Bld 131 (*)    All other components within normal limits  CBC - Abnormal; Notable for the following components:   Platelets 71 (*)    All other components within normal limits  URINALYSIS, ROUTINE W REFLEX MICROSCOPIC - Abnormal; Notable for the following components:   APPearance HAZY (*)    Hgb urine dipstick SMALL (*)    Protein, ur 30 (*)    Bacteria, UA RARE (*)    All other components within normal limits  LIPASE, BLOOD  RAPID HIV SCREEN (HIV 1/2 AB+AG)  RPR  GC/CHLAMYDIA PROBE AMP ([place]) NOT AT [facility]    EKG None  Radiology Ct Abdomen Pelvis Wo Contrast  Result Date: 09/19/2017 CLINICAL DATA:  Right lower quadrant pain and nausea. Suspected appendicitis. Approximately 2 months status post hysterectomy. EXAM: CT ABDOMEN AND PELVIS WITHOUT CONTRAST TECHNIQUE: Multidetector CT  imaging of the abdomen and pelvis was performed following the standard protocol without IV contrast. COMPARISON:  None. FINDINGS: Lower chest: No acute findings. Hepatobiliary: No mass visualized on this unenhanced exam. Gallbladder is unremarkable. Pancreas: No mass or inflammatory process visualized on this unenhanced exam. Spleen:  Within normal limits in size. Adrenals/Urinary tract: No evidence of urolithiasis or hydronephrosis. Unremarkable unopacified urinary bladder. Stomach/Bowel: No evidence of obstruction, inflammatory process, or abnormal fluid collections. Although the appendix is not directly visualized, no inflammatory process seen in region of the cecum or elsewhere. Vascular/Lymphatic: No pathologically enlarged lymph nodes identified. No evidence of abdominal aortic aneurysm. Reproductive: Several uterine fibroids are seen, largest measuring 3.6 cm. No adnexal mass identified. Tiny amount of free fluid in the pelvic cul-de-sac is likely physiologic. Other:  None. Musculoskeletal:  No suspicious bone lesions identified. IMPRESSION: Small amount of free fluid in pelvic cul-de-sac, most likely physiologic. No evidence of appendicitis or other acute findings. Several small uterine fibroids, largest measuring 3.6 cm. Electronically Signed   By: Earle Gell M.D.   On: 09/19/2017 13:14    Procedures Procedures (including critical care time)  Medications Ordered in ED Medications  Barium Sulfate 2.1 % SUSP (has no administration in time range)  morphine 4 MG/ML injection 4 mg (4 mg Intravenous Given 09/19/17 0843)  ondansetron (ZOFRAN) injection 4 mg (4 mg Intravenous Given 09/19/17 0843)  morphine 4 MG/ML injection 4 mg (4 mg Intravenous Given 09/19/17 1031)  morphine 4 MG/ML injection 4 mg (4 mg Intravenous Given 09/19/17 1503)     Initial Impression / Assessment and Plan / ED Course  I have reviewed the triage vital signs and the nursing notes.  Pertinent labs & imaging results that were  available during my care of the patient were reviewed by me and considered in my medical decision making (see chart for details).     BP 112/69   Pulse 70   Temp 98.2 F (36.8 C) (Oral)   Resp  13   LMP 05/02/2017 Comment: bleeds daily  SpO2 96%    Final Clinical Impressions(s) / ED Diagnoses   Final diagnoses:  Abdominal pain, RLQ    ED Discharge Orders    None     8:50 AM Patient here with reproducible right lower quadrant tenderness.  She has an intact appendix.  She report recent hysterectomy performed 2 months ago.  Will perform pelvic examination and obtain abdominal pelvic CT scan for further evaluation.  Pain medication and antinausea medication given.  1:35 PM Normal electrolytes panel, normal WBC, urine without signs of urinary tract infection, normal lipase, wet prep shows many WBC however she denies having any significant pain on pelvic exam.  CT of abdomen and pelvis showing a small amount of free fluid in the pelvic cul-de-sac most likely physiologic.  No evidence of appendicitis or other acute finding.  Several small uterine fibroids were noted.  Pt however had reported hysterectomy 2 mos ago.      2:34 PM I did consult with radiologist in regards to patient's finding.  Since patient had hysterectomy 2 months ago and now having the appearance on CT scan that suggest ovarian fibroid.  On appearance, patient appears to have a uterus however since she had a recent hysterectomy, radiologist felt that this could also be an ovarian mass or an ovarian torsion.  Plan to obtain the transvaginal ultrasound to rule out torsion.  4:16 PM Pt sign out to [employer], PA-C who will f/u on [place] report and determine disposition.    Domenic Moras, PA-C 09/20/17 Warden, Mayetta, DO 09/21/17 1749

## 2017-09-19 NOTE — ED Provider Notes (Signed)
6:24 PM Handoff from Haywood Pao at shift change. Pending [place] to eval ovarian torsion.   Spoke with Dr. Elson Clan who has discussed case with radiology.   She has seen patient in ED -- plan for surgery at [facility].   Arrangements made for transfer. Pt aware.   BP 112/69   Pulse 70   Temp 98.2 F (36.8 C) (Oral)   Resp 13   LMP 05/02/2017 Comment: bleeds daily  SpO2 96%     Carlisle Cater, PA-C 09/19/17 1826    Hayden Rasmussen, MD 09/21/17 [phone]

## 2017-09-19 NOTE — ED Notes (Signed)
Pt almost finished with contrast.

## 2017-09-19 NOTE — Anesthesia Procedure Notes (Signed)
Procedure Name: Intubation Date/Time: 09/19/2017 8:05 PM Performed by: Elenore Paddy, CRNA Pre-anesthesia Checklist: Patient identified, Emergency Drugs available, Suction available, Patient being monitored and Timeout performed Patient Re-evaluated:Patient Re-evaluated prior to induction Oxygen Delivery Method: Circle system utilized Preoxygenation: Pre-oxygenation with 100% oxygen Induction Type: IV induction and Rapid sequence Laryngoscope Size: Mac and 3 Grade View: Grade I Tube type: Oral Tube size: 7.0 mm Number of attempts: 2 (Had view of cords but unable to pass tube. Second attempt without stylett, easily passed through cords.) Placement Confirmation: ETT inserted through vocal cords under direct vision,  positive ETCO2 and breath sounds checked- equal and bilateral Secured at: 21 cm Tube secured with: Tape

## 2017-09-19 NOTE — Telephone Encounter (Signed)
Call to patient. Patient sounds sleepy and states she has received pain medication.  Reports onset of pain at 0100 this am. Compares this to labor pain.  Discussed possibly related to ovary. She is aware ultrasound is being ordered. She is also aware Dr Talbert Nan is on call for DR Sabra Heck.

## 2017-09-19 NOTE — ED Triage Notes (Signed)
Pt reports hysterectomy 5/28, states she has had ongoing RLQ pain that her doctor felt might be related to constipation, pt states pain went away and returned again last night. Also endorses nausea and some painful urination.

## 2017-09-20 ENCOUNTER — Other Ambulatory Visit: Payer: Self-pay

## 2017-09-20 ENCOUNTER — Encounter: Payer: Self-pay | Admitting: *Deleted

## 2017-09-20 ENCOUNTER — Telehealth: Payer: Self-pay | Admitting: Obstetrics & Gynecology

## 2017-09-20 ENCOUNTER — Encounter (HOSPITAL_COMMUNITY): Payer: Self-pay

## 2017-09-20 LAB — RPR: RPR Ser Ql: NONREACTIVE

## 2017-09-20 LAB — GC/CHLAMYDIA PROBE AMP (~~LOC~~) NOT AT ARMC
Chlamydia: NEGATIVE
Neisseria Gonorrhea: NEGATIVE

## 2017-09-20 LAB — CBC
HCT: 32.7 % — ABNORMAL LOW (ref 36.0–46.0)
HEMOGLOBIN: 11.5 g/dL — AB (ref 12.0–15.0)
MCH: 30.9 pg (ref 26.0–34.0)
MCHC: 35.2 g/dL (ref 30.0–36.0)
MCV: 87.9 fL (ref 78.0–100.0)
Platelets: 60 10*3/uL — ABNORMAL LOW (ref 150–400)
RBC: 3.72 MIL/uL — AB (ref 3.87–5.11)
RDW: 13.6 % (ref 11.5–15.5)
WBC: 9.5 10*3/uL (ref 4.0–10.5)

## 2017-09-20 MED ORDER — OXYCODONE-ACETAMINOPHEN 5-325 MG PO TABS: 1.0000 | ORAL_TABLET | ORAL | 0 refills | Status: DC | PRN

## 2017-09-20 NOTE — Telephone Encounter (Signed)
Spoke with patient. Provided contact information for employer, [employer] at [phone]. Was advised our office will contact employeer directly and confirm paperwork needed to return to work. Advised will return call with update.

## 2017-09-20 NOTE — Discharge Instructions (Signed)
Post-surgical Instructions, Outpatient Surgery  You may expect to feel dizzy, weak, and drowsy for as long as 24 hours after receiving the medicine that made you sleep (anesthetic). For the first 24 hours after your surgery:    Do not drive a car, ride a bicycle, participate in physical activities, or take public transportation until you are done taking narcotic pain medicines or as directed by Dr. Sabra Heck.   Do not drink alcohol or take tranquilizers.   Do not take medicine that has not been prescribed by your physicians.   Do not sign important papers or make important decisions while on narcotic pain medicines.   Have a responsible person with you.   PAIN MANAGEMENT  Vicodin 5/325mg .  For more severe pain, take one or two tablets every four to six hours as needed for pain control.  (Remember that narcotic pain medications increase your risk of constipation.  If this becomes a problem, you may take an over the counter stool softener like Colace 100mg  up to four times a day.)  Once you do not need the percocet, it is ok to take 2 extra strength Tylenol as needed every 6 hours.  DO'S AND DON'T'S  Do not take a tub bath for one week.  You may shower on the first day after your surgery  Do not do any heavy lifting for one to two weeks.  This increases the chance of bleeding.  Do move around as you feel able.  Stairs are fine.  You may begin to exercise again as you feel able.  Do not lift any weights for two weeks.  Do not put anything in the vagina for two weeks--no tampons, intercourse, or douching.    REGULAR MEDIATIONS/VITAMINS:  You may restart all of your regular medications as prescribed.  You may restart all of your vitamins as you normally take them.    PLEASE CALL OR SEEK MEDICAL CARE IF:  You have persistent nausea and vomiting.   You have trouble eating or drinking.   You have an oral temperature above 100.5.   You have constipation that is not helped by adjusting  diet or increasing fluid intake. Pain medicines are a common cause of constipation.   You have heavy vaginal bleeding

## 2017-09-20 NOTE — Progress Notes (Signed)
Discharge teaching complete with pt. Pt understood all information. Pt discharged home to family.

## 2017-09-20 NOTE — Telephone Encounter (Signed)
Patient states that she needs to speak with a nurse regarding paperwork after surgery.

## 2017-09-20 NOTE — Telephone Encounter (Signed)
Routing to S. Yeakley, RN.  

## 2017-09-20 NOTE — Progress Notes (Signed)
1 Day Post-Op Procedure(s) (LRB): LAPAROSCOPY DIAGNOSTIC WITH RIGHT OOPHERECTOMY, LYSIS OF ADHESIONS (N/A)  Subjective: Patient reports pain is much better.  Now just sore.  Eating breakfast.  Has voided and walked.  Percocet worked really well for her.  Objective: I have reviewed patient's vital signs, intake and output, medications and labs. Vitals:   09/20/17 0610 09/20/17 0759  BP: (!) 86/57 (!) 101/54  Pulse: 74 61  Resp: 16 18  Temp: 98 F (36.7 C) 98.1 F (36.7 C)  SpO2: 97% 98%   UOP since surgery:  >900cc  General: alert and no distress Resp: clear to auscultation bilaterally Cardio: regular rate and rhythm, S1, S2 normal, no murmur, click, rub or gallop GI: soft, non-tender; bowel sounds normal; no masses,  no organomegaly and incision: clean, dry and intact Extremities: extremities normal, atraumatic, no cyanosis or edema Vaginal Bleeding: none  Assessment: s/p Procedure(s): LAPAROSCOPY DIAGNOSTIC WITH RIGHT OOPHERECTOMY, LYSIS OF ADHESIONS (N/A): stable and progressing well  Plan: Discharge home  LOS: 0 days    Megan Salon 09/20/2017, 10:27 AM

## 2017-09-20 NOTE — Discharge Summary (Signed)
Physician Discharge Summary  Patient ID: Angel Rodriguez MRN: [id number] DOB/AGE: Oct 08, 1975 42 y.o.  Admit date: 09/19/2017 Discharge date: 09/20/2017  Admission Diagnoses:  RLQ pain, ovarian torsion  Discharge Diagnoses:  Cluster headaches, anxiety, right eye blindness Discharged Condition: good  Hospital Course: Patient admitted through the ER due to severe RLQ pain.  Evaluation revealed enlarged right ovary with evidence of torsion and possible intermittent torsion.  Due to pain and findings on evaluation, she was taken to OR where laparoscopy RSO with LOA were performed.  Surgical findings included enlarged right ovary with some adhesions present.  Surgery was uneventful.  EBL 100cc.  Foley catheter was removed before leaving OR.  Patient transferred to PACU where she was stable and then to 3rd floor for the remainder of her hospitalization.  She was kept overnight as the surgery was completed very late in the evening and she was in the PACU around 11pm.  During her post-op recovery, her vitals and stable and she was AF.  In the early morning of POD#1, she was able to transition to oral pain medications and regular diet.  She was able to ambulate and she had good pain control.  She was also able to void on her own.  In the AM of POD#1, she was without complaint.  Post op hb was 11.5.  At this point, patient was voiding, walking, having excellent pain control, had no nausea and was ready for D/C.  Consults: None  Significant Diagnostic Studies: labs: post ob CBC with hb 11.5  Treatments: surgery: laparoscopic RSO and LOA  Discharge Exam: Blood pressure (!) 101/54, pulse 61, temperature 98.1 F (36.7 C), temperature source Oral, resp. rate 18, height 5' (1.524 m), weight 173 lb (78.5 kg), last menstrual period 05/02/2017, SpO2 98 %. General appearance: alert, cooperative and no distress Resp: clear to auscultation bilaterally Cardio: regular rate and rhythm, S1, S2 normal, no murmur, click,  rub or gallop GI: soft, mildly tender but appropriate, non distended, normal BS Extremities: extremities normal, atraumatic, no cyanosis or edema Incision/Wound:C/D/I GYN:  No vaginal bleeding  Disposition: Discharge disposition: 01-Home or Self Care       Allergies as of 09/20/2017      Reactions   Ivp Dye [iodinated Diagnostic Agents] Hives, Itching, Swelling   Bee Venom Swelling      Medication List    TAKE these medications   acetaminophen-codeine 300-30 MG tablet Commonly known as:  TYLENOL #3 Take 1-2 tablets by mouth every 4 (four) hours as needed for moderate pain or severe pain (for post op pain).   atropine 1 % ophthalmic solution Place 1 drop into the right eye 3 (three) times daily.   brimonidine 0.1 % Soln Commonly known as:  ALPHAGAN P Place 1 drop into the right eye every 8 (eight) hours.   calcium carbonate 750 MG chewable tablet Commonly known as:  TUMS EX Chew 2 tablets by mouth as needed for heartburn.   clonazePAM 0.5 MG tablet Commonly known as:  KLONOPIN Take 1 tablet (0.5 mg total) by mouth 2 (two) times daily as needed. for anxiety What changed:    when to take this  additional instructions   dorzolamide-timolol 22.3-6.8 MG/ML ophthalmic solution Commonly known as:  COSOPT Place 1 drop into the right eye 2 (two) times daily.   fluticasone 50 MCG/ACT nasal spray Commonly known as:  FLONASE Place 1-2 sprays into both nostrils daily.   MULTI-VITAMINS Tabs Take 1 tablet by mouth daily.   oxyCODONE-acetaminophen  5-325 MG tablet Commonly known as:  PERCOCET/ROXICET Take 1 tablet by mouth every 4 (four) hours as needed for moderate pain ((when tolerating fluids)).   promethazine 25 MG tablet Commonly known as:  PHENERGAN Take 1 tablet (25 mg total) by mouth every 6 (six) hours as needed for nausea.   SUMAtriptan 100 MG tablet Commonly known as:  IMITREX Take 1 tablet (100 mg total) by mouth once as needed for migraine. May repeat in 2  hours if headache persists or recurs.   verapamil 240 MG CR tablet Commonly known as:  CALAN-SR Take 1 tablet (240 mg total) by mouth at bedtime.   VITAMIN B-12 PO Take 1 tablet by mouth daily.   XALATAN 0.005 % ophthalmic solution Generic drug:  latanoprost Place 1 drop into both eyes at bedtime.      Follow-up Information    Megan Salon, MD Follow up in 2 week(s).   Specialty:  Gynecology Why:  My office will call to schedule this with pt Contact information: [address] [place] [place] [address] [phone]           Signed: Megan Salon 09/20/2017, 10:34 AM

## 2017-09-20 NOTE — Telephone Encounter (Signed)
Spoke with [employer] Houseman, advised patient had surgery on 09/19/17, unrelated to previously surgery. Dr. Sabra Heck expects patient can return to work on 09/24/17 working 1/2 days.   Was instructed to fax written letter to [phone]. Letter should include surgery date,  when she will return,  how long she will be working half days and any restrictions.

## 2017-09-20 NOTE — Telephone Encounter (Signed)
Call to patient. Post op appointment scheduled for 10-02-17. Letter reviewed and signed by Dr Sabra Heck. Advised patient will send to employer now.   Routing to provider for final review.  Will close encounter.

## 2017-09-25 ENCOUNTER — Telehealth: Payer: Self-pay | Admitting: Obstetrics and Gynecology

## 2017-09-25 NOTE — Telephone Encounter (Signed)
Returned call from Dr Melina Copa in Pathology. 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 The ovary was morcellated in the bag and removed, surface not intact. The patient will need referral to GYN Oncology. Dr Melina Copa feels she will need at minimum her other ovary removed.

## 2017-09-26 ENCOUNTER — Encounter: Payer: Self-pay | Admitting: Obstetrics and Gynecology

## 2017-09-26 ENCOUNTER — Telehealth: Payer: Self-pay | Admitting: Obstetrics and Gynecology

## 2017-09-26 ENCOUNTER — Telehealth: Payer: Self-pay

## 2017-09-26 ENCOUNTER — Ambulatory Visit (INDEPENDENT_AMBULATORY_CARE_PROVIDER_SITE_OTHER): Payer: PRIVATE HEALTH INSURANCE | Admitting: Obstetrics and Gynecology

## 2017-09-26 ENCOUNTER — Other Ambulatory Visit: Payer: Self-pay

## 2017-09-26 VITALS — BP 112/72 | HR 68 | Wt 171.8 lb

## 2017-09-26 DIAGNOSIS — Z90721 Acquired absence of ovaries, unilateral: Secondary | ICD-10-CM

## 2017-09-26 DIAGNOSIS — G8918 Other acute postprocedural pain: Secondary | ICD-10-CM

## 2017-09-26 MED ORDER — OXYCODONE-ACETAMINOPHEN 5-325 MG PO TABS: 1.0000 | ORAL_TABLET | ORAL | 0 refills | Status: AC | PRN

## 2017-09-26 NOTE — Telephone Encounter (Signed)
Spoke with patient. OV scheduled for today at 2:45pm with Dr. Talbert Nan.   Patient states she has returned to work, was concerned about pain and bowel movements. Recommended OV for further evaluation.   Routing to provider for final review. Patient is agreeable to disposition. Will close encounter.  Cc: Dr. Talbert Nan

## 2017-09-26 NOTE — Progress Notes (Signed)
GYNECOLOGY  VISIT   HPI: 42 y.o.   Single  Caucasian  female   [phone] with Patient's last menstrual period was 05/02/2017.   here for post op pain. 7/10 on pain scale. The patient is one week s/p laparoscopy with right oophorectomy for ovarian torsion. She ran out of her percocet yesterday and can't take ibuprofen. She has had worsening pain since stopping the medication, mostly in the LLQ. She feels similar to how she felt after her [place] and states that she needed a refill on her narcotics after that surgery. She had a hard bowel movement a couple of days ago. She has been taking the miralax for a couple of days, a higher dose than recommended. This morning she had a large BM, then had many small soft to watery BM's after that. Last BM was 3 hours ago.  She decreased appetite since surgery, but she is eating some. Drinking water. No nausea or emesis. No fevers.  Her pain is still much better than it was prior to her surgery last week.    GYNECOLOGIC HISTORY: Patient's last menstrual period was 05/02/2017. Contraception: Hysterectomy Menopausal hormone therapy: None         OB History    Gravida  5   Para  3   Term  3   Preterm  0   AB  2   Living  3     SAB  1   TAB      Ectopic  1   Multiple      Live Births                 Patient Active Problem List   Diagnosis Date Noted  . Abnormal MRI of head 01/21/2016  . Anemia 01/12/2016  . Vascular headache 09/15/2015  . Insomnia 09/15/2015  . Headache 07/08/2015  . Headache, chronic migraine without aura, intractable 07/08/2015  . Anxiety state 07/08/2015  . Neck pain 06/24/2015  . Convulsions ([place]) 06/24/2015  . Central retinal vein occlusion 12/11/2011  . Cotton wool exudates 12/11/2011  . Cystoid macular edema 12/11/2011    Past Medical History:  Diagnosis Date  . Anxiety   . Blind right eye    swelling & pressure  . Cluster headaches    started after seizure  . Depression   . Fibroids    s/p TLH,  bilateral salpingectomy  . GERD (gastroesophageal reflux disease)    patient thinks due to medications  . History of anemia    prior to hysterectomy  . History of pneumonia   . History of trichomoniasis   . Seizures ([place])    once saw neurologist, full body brain MRI and another 6 months lster  . Status post right oophorectomy   . Thrombocytopenia ([place])     Past Surgical History:  Procedure Laterality Date  . CYSTOSCOPY N/A 07/31/2017   Procedure: CYSTOSCOPY;  Surgeon: Megan Salon, MD;  Location: [facility];  Service: Gynecology;  Laterality: N/A;  . ENDOMETRIAL ABLATION    . EYE SURGERY     removed blood from right eye  . HYSTEROSCOPY W/D&C    . LAPAROSCOPY N/A 09/19/2017   Procedure: LAPAROSCOPY DIAGNOSTIC WITH RIGHT OOPHERECTOMY, LYSIS OF ADHESIONS;  Surgeon: Salvadore Dom, MD;  Location: [facility];  Service: Gynecology;  Laterality: N/A;  . TOTAL LAPAROSCOPIC HYSTERECTOMY WITH SALPINGECTOMY Bilateral 07/31/2017   Procedure: TOTAL LAPAROSCOPIC HYSTERECTOMY WITH SALPINGECTOMY;  Surgeon: Megan Salon, MD;  Location: [facility];  Service: Gynecology;  Laterality:  Bilateral;  20 week size uterus/ Alexis bag in room/ need 4.5 hours  . TUBAL LIGATION    . WISDOM TOOTH EXTRACTION      Current Outpatient Medications  Medication Sig Dispense Refill  . acetaminophen-codeine (TYLENOL #3) 300-30 MG tablet Take 1-2 tablets by mouth every 4 (four) hours as needed for moderate pain or severe pain (for post op pain). 30 tablet 0  . atropine 1 % ophthalmic solution Place 1 drop into the right eye 3 (three) times daily.     . brimonidine (ALPHAGAN P) 0.1 % SOLN Place 1 drop into the right eye every 8 (eight) hours.     . calcium carbonate (TUMS EX) 750 MG chewable tablet Chew 2 tablets by mouth as needed for heartburn.    . clonazePAM (KLONOPIN) 0.5 MG tablet Take 1 tablet (0.5 mg total) by mouth 2 (two) times daily as needed. for anxiety (Patient taking differently: Take 0.5 mg by mouth daily. for  anxiety) 180 tablet 1  . Cyanocobalamin (VITAMIN B-12 PO) Take 1 tablet by mouth daily.     . dorzolamide-timolol (COSOPT) 22.3-6.8 MG/ML ophthalmic solution Place 1 drop into the right eye 2 (two) times daily.     . fluticasone (FLONASE) 50 MCG/ACT nasal spray Place 1-2 sprays into both nostrils daily.    [name] latanoprost (XALATAN) 0.005 % ophthalmic solution Place 1 drop into both eyes at bedtime.    . Multiple Vitamin (MULTI-VITAMINS) TABS Take 1 tablet by mouth daily.     [name] oxyCODONE-acetaminophen (PERCOCET/ROXICET) 5-325 MG tablet Take 1 tablet by mouth every 4 (four) hours as needed for moderate pain ((when tolerating fluids)). 20 tablet 0  . promethazine (PHENERGAN) 25 MG tablet Take 1 tablet (25 mg total) by mouth every 6 (six) hours as needed for nausea. 20 tablet 0  . SUMAtriptan (IMITREX) 100 MG tablet Take 1 tablet (100 mg total) by mouth once as needed for migraine. May repeat in 2 hours if headache persists or recurs. 18 tablet 3  . verapamil (CALAN-SR) 240 MG CR tablet Take 1 tablet (240 mg total) by mouth at bedtime. 90 tablet 3   No current facility-administered medications for this visit.    Facility-Administered Medications Ordered in Other Visits  Medication Dose Route Frequency Provider Last Rate Last Dose  . gadopentetate dimeglumine (MAGNEVIST) injection 16 mL  16 mL Intravenous Once PRN Sater, Nanine Means, MD         ALLERGIES: Ivp dye [iodinated diagnostic agents] and Bee venom  Family History  Problem Relation Age of Onset  . Alcoholism Father   . Cirrhosis Father   . Cancer Maternal Grandmother        unsure of type    Social History   Socioeconomic History  . Marital status: Single    Spouse name: Not on file  . Number of children: Not on file  . Years of education: Not on file  . Highest education level: Not on file  Occupational History  . Not on file  Social Needs  . Financial resource strain: Not on file  . Food insecurity:    Worry: Not on file     Inability: Not on file  . Transportation needs:    Medical: Not on file    Non-medical: Not on file  Tobacco Use  . Smoking status: Current Some Day Smoker    Years: 20.00    Types: E-cigarettes, Cigarettes  . Smokeless tobacco: Never Used  . Tobacco comment: occasional cigerate and vap  Substance and Sexual Activity  . Alcohol use: Yes    Comment: occasional  . Drug use: No  . Sexual activity: Yes    Birth control/protection: Surgical    Comment: hysterectomy  Lifestyle  . Physical activity:    Days per week: Not on file    Minutes per session: Not on file  . Stress: Not on file  Relationships  . Social connections:    Talks on phone: Not on file    Gets together: Not on file    Attends religious service: Not on file    Active member of club or organization: Not on file    Attends meetings of clubs or organizations: Not on file    Relationship status: Not on file  . Intimate partner violence:    Fear of current or ex partner: Not on file    Emotionally abused: Not on file    Physically abused: Not on file    Forced sexual activity: Not on file  Other Topics Concern  . Not on file  Social History Narrative  . Not on file    Review of Systems  Constitutional: Negative.   HENT: Negative.   Eyes: Negative.   Respiratory: Negative.   Cardiovascular: Negative.   Gastrointestinal: Positive for abdominal pain.  Genitourinary:       Bloating  Musculoskeletal: Negative.   Skin: Negative.   Endo/Heme/Allergies: Negative.   Psychiatric/Behavioral: Negative.     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  CC: Dr Sabra Heck

## 2017-09-26 NOTE — Telephone Encounter (Signed)
Patient would like to speak to a nurse about an "episode" that she had last night. She was in pain, and took Miralax. Patient stated that she then took a pain pill. Patient stated that she used the bathroom and is no longer in pain.

## 2017-09-26 NOTE — Telephone Encounter (Signed)
Returned Gay Filler Vm from Dr Talbert Nan office regarding needs appt for referral.  [name] NP reviewed for referral and approved - appt given to [place] for 7-29 at 8:15 am this Monday- she will tell pt as pt has appt at their office today.

## 2017-09-26 NOTE — Telephone Encounter (Signed)
She has to be seen for a prescription for pain mediation.  Dr. Talbert Nan did her surgery so could see her this afternoon if needed.  I thought she was already working so I need a little more clarification about work before answering this question for her.

## 2017-09-26 NOTE — Telephone Encounter (Signed)
Spoke with patient. Appetite has slowly  increased, ate more food yesterday. Has only had 1 or 2 "compacted" BMs since surgery on 09/19/17. Took Miralax last night, experienced diarrhea all last night, last BM at 9am this morning.  Feels better now. Denies N/V, fever/chills, bleeding. Abdomen soft. Pain at incision 4/10. Taking 1 percocet Q4hrs after she returns home from work. Patient states she takes 2-3 tabs total in the afternoon hrs, "helps with sleep". Patient requesting refill of percocet and asking if she is ok to work?  Recommended OV for further evaluation, patient declined. States she will see Dr. Sabra Heck for f/u on 7/30.   Advised will review plan with Dr. Sabra Heck and return call.

## 2017-10-01 ENCOUNTER — Inpatient Hospital Stay: Payer: PRIVATE HEALTH INSURANCE | Attending: Hematology | Admitting: Obstetrics

## 2017-10-01 ENCOUNTER — Encounter: Payer: Self-pay | Admitting: Obstetrics

## 2017-10-01 VITALS — BP 103/55 | HR 64 | Temp 98.7°F | Resp 20 | Ht 60.0 in | Wt 173.7 lb

## 2017-10-01 DIAGNOSIS — C561 Malignant neoplasm of right ovary: Secondary | ICD-10-CM

## 2017-10-01 DIAGNOSIS — Z90710 Acquired absence of both cervix and uterus: Secondary | ICD-10-CM

## 2017-10-01 DIAGNOSIS — Z90722 Acquired absence of ovaries, bilateral: Secondary | ICD-10-CM

## 2017-10-01 NOTE — Patient Instructions (Signed)
1) Plan to follow up in three weeks with a pelvic examination with Dr. Gerarda Fraction.  2) We will be getting the path reviewed and will have at your next visit.  If the results are not back at the time of your appointment, we will adjust the date.   3) Please call for any questions or concerns.

## 2017-10-02 ENCOUNTER — Ambulatory Visit (INDEPENDENT_AMBULATORY_CARE_PROVIDER_SITE_OTHER): Payer: PRIVATE HEALTH INSURANCE | Admitting: Obstetrics & Gynecology

## 2017-10-02 ENCOUNTER — Encounter: Payer: Self-pay | Admitting: Obstetrics & Gynecology

## 2017-10-02 VITALS — BP 110/60 | HR 64 | Resp 16 | Ht 60.0 in | Wt 176.6 lb

## 2017-10-02 DIAGNOSIS — D3910 Neoplasm of uncertain behavior of unspecified ovary: Secondary | ICD-10-CM

## 2017-10-02 DIAGNOSIS — Z9889 Other specified postprocedural states: Secondary | ICD-10-CM

## 2017-10-02 NOTE — Progress Notes (Signed)
Post Operative Visit  Procedure: Laparoscopy Diagnostic with Right Oophorectomy, Lysis of Adhesions.  Days Post-op: 13  Subjective: Doing well but emotional today.  Saw gyn/onc yesterday.  Note is not complete.  Pt feels like she was advised she will need open procedure but I think this must be just in case.  Questions answered.  She did request pathology re-read.  Reports it is going to [facility].  Denies vaginal bleeding.  Pain medication use is minimal.  Reports she is recovering more quickly this time.  Questions answered.  Objective: BP 110/60 (BP Location: Right Arm, Patient Position: Sitting, Cuff Size: Large)   Pulse 64   Resp 16   Ht 5' (1.524 m)   Wt 176 lb 9.6 oz (80.1 kg)   LMP 05/02/2017 Comment: bleeds daily  BMI 34.49 kg/m   EXAM General: alert and no distress Resp: clear to auscultation bilaterally Cardio: regular rate and rhythm, S1, S2 normal, no murmur, click, rub or gallop GI: soft, non-tender; bowel sounds normal; no masses,  no organomegaly and incision: clean, dry and intact Extremities: extremities normal, atraumatic, no cyanosis or edema Vaginal Bleeding: none  Assessment: s/p laparoscopic RSO due to ovarian torsion with final pathology showing borderline ovarian tumor  Plan: Will be seeing gyn/onc in 3 weeks after pathology has been reread for repeat exam and surgical scheduling.  Note to return to work given.

## 2017-10-03 ENCOUNTER — Encounter: Payer: Self-pay | Admitting: Obstetrics

## 2017-10-03 ENCOUNTER — Telehealth: Payer: Self-pay

## 2017-10-03 DIAGNOSIS — D3910 Neoplasm of uncertain behavior of unspecified ovary: Secondary | ICD-10-CM | POA: Insufficient documentation

## 2017-10-03 MED ORDER — VITAMIN D (ERGOCALCIFEROL) 1.25 MG (50000 UNIT) PO CAPS: 50000.0000 [IU] | ORAL_CAPSULE | ORAL | 0 refills | Status: DC

## 2017-10-03 NOTE — Progress Notes (Signed)
[place] at [facility] Note: New Patient FIRST VISIT   Consult was requested by Dr. 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 Ovarian Cancer    HPI: [name]  is a very nice 42 y.o.  P3  She noted menstrual-like bleeding (or more) starting August 2018. She had previously had endometrial ablation years prior. In January 2019 the bleeding worsened and she had a hemoglobin down to 7 noting she had to wear "diapers".   She has been seen by Heme-Onc for thrombocytopenia with platelets down into the 70/80's for the last couple of years even when there was no associated anemia.  Gyn followup revealed fibroids and discussion was had for surgery following Lupron. She then underwent TLH/Bilateral salpingectomy 07/31/17. A few weeks postop she noted signficant RLQ pain and nausea/vomiting. Workup with CTScan and ultrasound revealed a large adnexal mass (8.8 x 7.5 x 7.4 cm. There are multiple cysts measuring up to 3.8 x 3.5 x 2.5 cm. The ovary is markedly enlarged with increased echogenicity consistent with edema) and suspected torsion so she was taken for an urgent right oophorectomy 09/19/17. It is of note the right adnexa was grossly normal at the time of her prior surgery however after the torsion/hemorrhage/necrosis the right adnexa was found to be enlarged up to 8cm as noted above. No evidence of metastatic disease on the CT done 09/19/17.  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 Surface involvement cannot be determined due to (controlled) capsule rupture in an endocatch bag to deliver through the laparoscopic incisions.  In addition no other staging or washings was performed (given the normal prior appearance of the  ovary).  She is therefore referred for management.  She is still healing from this recent surgery. Notes some constipation and appetite change.   Imported EPIC Oncologic History:   No history exists.    ECOG PERFORMANCE STATUS: 1 - Symptomatic but completely ambulatory  Measurement of disease:  . Nothing done preoperatively . We'll follow CA125 in followup  Radiology: Ct Abdomen Pelvis Wo Contrast  Addendum Date: 09/19/2017   ADDENDUM REPORT: 09/19/2017 14:37 ADDENDUM: Given the patient's recent hysterectomy, the soft tissue density in the central pelvis cannot represent a fibroid uterus. This measures 9.4 x 7.0 cm, and contains several internal low-attenuation cystic areas. This is suspicious for a swollen right ovary due to ovarian torsion, with ovarian neoplasm considered less likely. Recommend clinical correlation, and consider pelvic ultrasound with Doppler for further evaluation. This was discussed with the patient's caretaker in the ED, Angel Rodriguez, at the time of this addendum at 1435 hours on 09/19/2017. Electronically Signed   By: Earle Gell M.D.   On: 09/19/2017 14:37   Result Date: 09/19/2017 CLINICAL DATA:  Right lower quadrant pain and nausea. Suspected appendicitis. Approximately 2 months status post hysterectomy. EXAM: CT ABDOMEN AND PELVIS WITHOUT CONTRAST TECHNIQUE: Multidetector CT imaging of the abdomen and pelvis was performed following the standard protocol without IV contrast. COMPARISON:  None. FINDINGS: Lower chest: No acute findings. Hepatobiliary: No mass visualized on this unenhanced exam. Gallbladder is unremarkable. Pancreas: No mass or inflammatory process visualized on this unenhanced exam. Spleen:  Within normal limits in size. Adrenals/Urinary tract: No evidence of urolithiasis or hydronephrosis. Unremarkable unopacified urinary bladder. Stomach/Bowel: No evidence of obstruction,  inflammatory process, or abnormal fluid collections. Although the appendix is not  directly visualized, no inflammatory process seen in region of the cecum or elsewhere. Vascular/Lymphatic: No pathologically enlarged lymph nodes identified. No evidence of abdominal aortic aneurysm. Reproductive: Several uterine fibroids are seen, largest measuring 3.6 cm. No adnexal mass identified. Tiny amount of free fluid in the pelvic cul-de-sac is likely physiologic. Other:  None. Musculoskeletal:  No suspicious bone lesions identified. IMPRESSION: Small amount of free fluid in pelvic cul-de-sac, most likely physiologic. No evidence of appendicitis or other acute findings. Several small uterine fibroids, largest measuring 3.6 cm. Electronically Signed: By: Earle Gell M.D. On: 09/19/2017 13:14   US Transvaginal Non-ob  Result Date: 09/19/2017 CLINICAL DATA:  Right lower quadrant pain.  History of hysterectomy. EXAM: TRANSABDOMINAL AND TRANSVAGINAL ULTRASOUND OF PELVIS DOPPLER ULTRASOUND OF OVARIES TECHNIQUE: Both transabdominal and transvaginal ultrasound examinations of the pelvis were performed. Transabdominal technique was performed for global imaging of the pelvis including uterus, ovaries, adnexal regions, and pelvic cul-de-sac. It was necessary to proceed with endovaginal exam following the transabdominal exam to visualize the ovaries. Color and duplex Doppler ultrasound was utilized to evaluate blood flow to the ovaries. COMPARISON:  CT abdomen pelvis 09/19/2017 FINDINGS: Uterus Status post hysterectomy. Endometrium Status post hysterectomy. Right ovary Measurements: 8.8 x 7.5 x 7.4 cm. There are multiple cysts measuring up to 3.8 x 3.5 x 2.5 cm. The ovary is markedly enlarged with increased echogenicity consistent with edema. Left ovary Measurements: 4.6 x 3.0 x 3.2 cm. Normal appearance/no adnexal mass. Pulsed Doppler evaluation of both ovaries demonstrates normal low-resistance arterial and venous waveforms. Other findings Small amount of free fluid in the posterior cul-de-sac. IMPRESSION:  Enlarged, edematous right ovary position in the abdominal midline is suggestive of ovarian torsion, despite maintained arterial and venous waveforms. Intermittent torsion/detorsion is possible. These results were discussed by telephone at the time of interpretation on 09/19/2017 at 5:29 pm with Dr. Sumner Boast, who verbally acknowledged these results. Electronically Signed   By: Ulyses Jarred M.D.   On: 09/19/2017 17:31   US Pelvis Complete  Result Date: 09/19/2017 CLINICAL DATA:  Right lower quadrant pain.  History of hysterectomy. EXAM: TRANSABDOMINAL AND TRANSVAGINAL ULTRASOUND OF PELVIS DOPPLER ULTRASOUND OF OVARIES TECHNIQUE: Both transabdominal and transvaginal ultrasound examinations of the pelvis were performed. Transabdominal technique was performed for global imaging of the pelvis including uterus, ovaries, adnexal regions, and pelvic cul-de-sac. It was necessary to proceed with endovaginal exam following the transabdominal exam to visualize the ovaries. Color and duplex Doppler ultrasound was utilized to evaluate blood flow to the ovaries. COMPARISON:  CT abdomen pelvis 09/19/2017 FINDINGS: Uterus Status post hysterectomy. Endometrium Status post hysterectomy. Right ovary Measurements: 8.8 x 7.5 x 7.4 cm. There are multiple cysts measuring up to 3.8 x 3.5 x 2.5 cm. The ovary is markedly enlarged with increased echogenicity consistent with edema. Left ovary Measurements: 4.6 x 3.0 x 3.2 cm. Normal appearance/no adnexal mass. Pulsed Doppler evaluation of both ovaries demonstrates normal low-resistance arterial and venous waveforms. Other findings Small amount of free fluid in the posterior cul-de-sac. IMPRESSION: Enlarged, edematous right ovary position in the abdominal midline is suggestive of ovarian torsion, despite maintained arterial and venous waveforms. Intermittent torsion/detorsion is possible. These results were discussed by telephone at the time of interpretation on 09/19/2017 at 5:29 pm with  Dr. Sumner Boast, who verbally acknowledged these results. Electronically Signed   By: Ulyses Jarred M.D.   On: 09/19/2017 17:31   [place] Art/ven Flow Abd  Pelv Doppler  Result Date: 09/19/2017 CLINICAL DATA:  Right lower quadrant pain.  History of hysterectomy. EXAM: TRANSABDOMINAL AND TRANSVAGINAL ULTRASOUND OF PELVIS DOPPLER ULTRASOUND OF OVARIES TECHNIQUE: Both transabdominal and transvaginal ultrasound examinations of the pelvis were performed. Transabdominal technique was performed for global imaging of the pelvis including uterus, ovaries, adnexal regions, and pelvic cul-de-sac. It was necessary to proceed with endovaginal exam following the transabdominal exam to visualize the ovaries. Color and duplex Doppler ultrasound was utilized to evaluate blood flow to the ovaries. COMPARISON:  CT abdomen pelvis 09/19/2017 FINDINGS: Uterus Status post hysterectomy. Endometrium Status post hysterectomy. Right ovary Measurements: 8.8 x 7.5 x 7.4 cm. There are multiple cysts measuring up to 3.8 x 3.5 x 2.5 cm. The ovary is markedly enlarged with increased echogenicity consistent with edema. Left ovary Measurements: 4.6 x 3.0 x 3.2 cm. Normal appearance/no adnexal mass. Pulsed Doppler evaluation of both ovaries demonstrates normal low-resistance arterial and venous waveforms. Other findings Small amount of free fluid in the posterior cul-de-sac. IMPRESSION: Enlarged, edematous right ovary position in the abdominal midline is suggestive of ovarian torsion, despite maintained arterial and venous waveforms. Intermittent torsion/detorsion is possible. These results were discussed by telephone at the time of interpretation on 09/19/2017 at 5:29 pm with Dr. Sumner Boast, who verbally acknowledged these results. Electronically Signed   By: Ulyses Jarred M.D.   On: 09/19/2017 17:31  .  [name]   Outpatient Encounter Medications as of 10/01/2017  Medication Sig  . acetaminophen-codeine (TYLENOL #3) 300-30 MG tablet Take 1-2 tablets  by mouth every 4 (four) hours as needed for moderate pain or severe pain (for post op pain).  [name] atropine 1 % ophthalmic solution Place 1 drop into the right eye 3 (three) times daily.   . brimonidine (ALPHAGAN P) 0.1 % SOLN Place 1 drop into the right eye every 8 (eight) hours.   . calcium carbonate (TUMS EX) 750 MG chewable tablet Chew 2 tablets by mouth as needed for heartburn.  . clonazePAM (KLONOPIN) 0.5 MG tablet Take 1 tablet (0.5 mg total) by mouth 2 (two) times daily as needed. for anxiety (Patient taking differently: Take 0.5 mg by mouth daily. for anxiety)  . fluticasone (FLONASE) 50 MCG/ACT nasal spray Place 1-2 sprays into both nostrils daily.  [name] latanoprost (XALATAN) 0.005 % ophthalmic solution Place 1 drop into both eyes at bedtime.  . Multiple Vitamin (MULTI-VITAMINS) TABS Take 1 tablet by mouth daily.   . [EXPIRED] oxyCODONE-acetaminophen (PERCOCET/ROXICET) 5-325 MG tablet Take 1 tablet by mouth every 4 (four) hours as needed for up to 5 days for moderate pain ((when tolerating fluids)).  [name] promethazine (PHENERGAN) 25 MG tablet Take 1 tablet (25 mg total) by mouth every 6 (six) hours as needed for nausea. (Patient not taking: Reported on 10/02/2017)  . SUMAtriptan (IMITREX) 100 MG tablet Take 1 tablet (100 mg total) by mouth once as needed for migraine. May repeat in 2 hours if headache persists or recurs.  . verapamil (CALAN-SR) 240 MG CR tablet Take 1 tablet (240 mg total) by mouth at bedtime.  . Cyanocobalamin (VITAMIN B-12 PO) Take 1 tablet by mouth daily.   . dorzolamide-timolol (COSOPT) 22.3-6.8 MG/ML ophthalmic solution Place 1 drop into the right eye 2 (two) times daily.    Facility-Administered Encounter Medications as of 10/01/2017  Medication  . gadopentetate dimeglumine (MAGNEVIST) injection 16 mL   Allergies  Allergen Reactions  . Ivp Dye [Iodinated Diagnostic Agents] Hives, Itching and Swelling  . Bee Venom Swelling  Past Medical History:  Diagnosis Date  .  Anxiety   . Blind right eye    swelling & pressure  . Cluster headaches    started after seizure  . Depression   . Fibroids    s/p TLH, bilateral salpingectomy  . GERD (gastroesophageal reflux disease)    patient thinks due to medications  . History of anemia    prior to hysterectomy  . History of pneumonia   . History of trichomoniasis   . Seizures ([place])    once saw neurologist, full body brain MRI and another 6 months lster  . Status post right oophorectomy   . Thrombocytopenia ([place])    Past Surgical History:  Procedure Laterality Date  . CYSTOSCOPY N/A 07/31/2017   Procedure: CYSTOSCOPY;  Surgeon: Megan Salon, MD;  Location: [facility];  Service: Gynecology;  Laterality: N/A;  . ENDOMETRIAL ABLATION    . EYE SURGERY     removed blood from right eye  . HYSTEROSCOPY W/D&C    . LAPAROSCOPY N/A 09/19/2017   Procedure: LAPAROSCOPY DIAGNOSTIC WITH RIGHT OOPHERECTOMY, LYSIS OF ADHESIONS;  Surgeon: Salvadore Dom, MD;  Location: [facility];  Service: Gynecology;  Laterality: N/A;  . TOTAL LAPAROSCOPIC HYSTERECTOMY WITH SALPINGECTOMY Bilateral 07/31/2017   Procedure: TOTAL LAPAROSCOPIC HYSTERECTOMY WITH SALPINGECTOMY;  Surgeon: Megan Salon, MD;  Location: [facility];  Service: Gynecology;  Laterality: Bilateral;  20 week size uterus/ Alexis bag in room/ need 4.5 hours  . TUBAL LIGATION    . WISDOM TOOTH EXTRACTION          Past Gynecological History:   Patient's last menstrual period was 05/02/2017. . Menarche: 42 years old . P 3 . Contraceptive H/o OCP . HRT none  . Last Pap 03/2017 negative with neg HRHPV Family Hx:  Family History  Problem Relation Age of Onset  . Alcoholism Father   . Cirrhosis Father   . Cancer Maternal Grandmother        unsure of type  . Heart disease Maternal Aunt    Social Hx:  [name] Tobacco use: current some days and vap . Alcohol use: 10/month . Illicit Drug use: none . Illicit IV Drug use: none    Review of Systems: Review of Systems   Constitutional: Positive for appetite change and fatigue.  Gastrointestinal: Positive for constipation, nausea and vomiting.  Neurological: Positive for dizziness, headaches and numbness.  Psychiatric/Behavioral: Positive for decreased concentration and depression. The patient is nervous/anxious.   All other systems reviewed and are negative.   Vitals: Blood pressure (!) 103/55, pulse 64, temperature 98.7 F (37.1 C), temperature source Oral, resp. rate 20, height 5' (1.524 m), weight 173 lb 11.2 oz (78.8 kg), last menstrual period 05/02/2017, SpO2 100 %. Vitals:   10/01/17 0928  Weight: 173 lb 11.2 oz (78.8 kg)  Height: 5' (1.524 m)    Body mass index is 33.92 kg/m.   Physical Exam: General :  Well developed, 42 y.o., female in no apparent distress HEENT:  Normocephalic/atraumatic, symmetric, EOMI, eyelids normal Neck:   Supple, no masses.  Lymphatics:  No cervical/ submandibular/ supraclavicular/ infraclavicular/ inguinal adenopathy Respiratory:  Respirations unlabored, no use of accessory muscles CV:   Deferred Breast:  Deferred Musculoskeletal: No CVA tenderness, normal muscle strength. Abdomen:  Soft, non-tender and nondistended. No evidence of hernia. No masses. Incisions CDI Extremities:  No lymphedema, no erythema, non-tender. Skin:   Normal inspection Neuro/Psych:  No focal motor deficit, no abnormal mental status. Normal gait. Normal affect. 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 Management of ovarian cancer o This was unanticipated. Her preoperative imaging both times did not indicate a lesion of concern o However she has the left ovary in-situ and has completed child-bearing o My recommendation is for removal of the remaining ovary and we can do washings and omentectomy while we are  there. 2. Question borderline versus low grade serous o These are 2 different lesions and so I need to clarify with pathology what there intention is here o I suspect the terminology "non-invasive" implies favoring borderline over "invasive low grade serous ovarian CA" o We may need pathology review o If borderline then recommend [place] left oophorectomy, washings, and omentectomy o If low grade serous then will need nodal dissection and consideration for exploration given what sounds like adhesive disease mentioned in the 7/17 operative report. 3. Micropapillary variant o This increases the risk for invasive implants, but is not an indication by itself for chemotherapy 4. We did discuss the possibility of chemotherapy if this is a low grade serous CA and any extraovarian disease is detected at time of surgery 5. Blood in pelvis at surgery 09/19/17 o This does not indicate absolutely there was rupture and I would not upstage her based on this o I will plan to get washings at our surgery 6. In surveillance we'll follow CA125, although it was not checked preop due to lack of suspicion of malignancy 7. RTC 3 weeks to review above (path specifically) and allow some healing before proceeding to another surgery.  Face to face time with patient was 60 minutes. Over 50% of this time was spent on counseling and coordination of care.  Isabel Caprice, MD  10/03/2017, 6:05 PM    Cc: Sumner Boast, MD (Referring Ob/Gyn) none (PCP)

## 2017-10-03 NOTE — Telephone Encounter (Signed)
Spoke with patient. Results given. Patient verbalizes understanding. Rx for Vitamin D 50000 IU weekly #12 0RF sent to pharmacy on file. 3 month follow up scheduled for 01/04/2018 at 3 pm. Patient is agreeable to date and time. Encounter closed.

## 2017-10-03 NOTE — Telephone Encounter (Signed)
Left message to call Angel Rodriguez at [phone]. 

## 2017-10-03 NOTE — Telephone Encounter (Signed)
-----   Message from Megan Salon, MD sent at 10/03/2017  6:46 AM EDT ----- Please let pt know that her calcium level was improved but her Vit d was low.  I forgot to review this with her yesterday.  She needs to be on 50K Vit D weekly and recheck Vit D, calcium and PTH level in three months.  Orders for lab work placed already.  She should not start anything for weight loss at this time.

## 2017-10-03 NOTE — Addendum Note (Signed)
Addended by: Megan Salon on: 10/03/2017 06:48 AM   Modules accepted: Orders

## 2017-10-04 ENCOUNTER — Other Ambulatory Visit (HOSPITAL_COMMUNITY)
Admission: RE | Admit: 2017-10-04 | Discharge: 2017-10-04 | Disposition: A | Discharge status: Home / Self Care | Payer: PRIVATE HEALTH INSURANCE | Source: Ambulatory Visit | Attending: Obstetrics | Admitting: Obstetrics

## 2017-10-04 DIAGNOSIS — C561 Malignant neoplasm of right ovary: Secondary | ICD-10-CM | POA: Diagnosis present

## 2017-10-10 ENCOUNTER — Telehealth: Payer: Self-pay | Admitting: Oncology

## 2017-10-10 NOTE — Telephone Encounter (Signed)
[facility] Pathology and left a message to hold on to the slides from patient's surgery on 09/19/2017 as patient will be presented at [facility] tumor board next week.

## 2017-10-15 ENCOUNTER — Telehealth: Payer: Self-pay | Admitting: Oncology

## 2017-10-15 NOTE — Telephone Encounter (Signed)
Called Angel Rodriguez in Radiology and requested that patient's images are power shared with [facility] starting with ultrasound on 03/29/17.

## 2017-10-18 ENCOUNTER — Ambulatory Visit: Payer: PRIVATE HEALTH INSURANCE | Admitting: Neurology

## 2017-10-22 ENCOUNTER — Ambulatory Visit: Payer: PRIVATE HEALTH INSURANCE | Admitting: Obstetrics

## 2017-10-25 ENCOUNTER — Other Ambulatory Visit: Payer: Self-pay

## 2017-10-25 ENCOUNTER — Ambulatory Visit (INDEPENDENT_AMBULATORY_CARE_PROVIDER_SITE_OTHER): Payer: PRIVATE HEALTH INSURANCE | Admitting: Neurology

## 2017-10-25 ENCOUNTER — Encounter: Payer: Self-pay | Admitting: Neurology

## 2017-10-25 VITALS — BP 121/71 | HR 59 | Resp 18 | Ht 60.0 in | Wt 174.5 lb

## 2017-10-25 DIAGNOSIS — G441 Vascular headache, not elsewhere classified: Secondary | ICD-10-CM

## 2017-10-25 DIAGNOSIS — G4700 Insomnia, unspecified: Secondary | ICD-10-CM | POA: Diagnosis not present

## 2017-10-25 DIAGNOSIS — R569 Unspecified convulsions: Secondary | ICD-10-CM

## 2017-10-25 DIAGNOSIS — F411 Generalized anxiety disorder: Secondary | ICD-10-CM

## 2017-10-25 DIAGNOSIS — H348112 Central retinal vein occlusion, right eye, stable: Secondary | ICD-10-CM

## 2017-10-25 MED ORDER — FLUOXETINE HCL 20 MG PO CAPS: 20.0000 mg | ORAL_CAPSULE | Freq: Every day | ORAL | 2 refills | Status: DC

## 2017-10-25 MED ORDER — ACETAMINOPHEN-CODEINE #3 300-30 MG PO TABS: 1.0000 | ORAL_TABLET | ORAL | 0 refills | Status: DC | PRN

## 2017-10-25 MED ORDER — CLONAZEPAM 0.5 MG PO TABS: 0.5000 mg | ORAL_TABLET | Freq: Two times a day (BID) | ORAL | 1 refills | Status: DC | PRN

## 2017-10-25 NOTE — Progress Notes (Signed)
[facility]  PATIENT: Angel Rodriguez DOB: 04-Feb-1976  REFERRING DOCTOR OR PCP:   SOURCE: patient, ED records, images on PACS, reports in EMR  _________________________________   HISTORICAL  CHIEF COMPLAINT:  Chief Complaint  Patient presents with  . Headache    Sts. h/a's are less frequent, less severe since sphenocath, tpi's given at last ov/fim    HISTORY OF PRESENT ILLNESS:  Angel Rodriguez is a 42 y.o. woman with frequent headaches and h/o seizure.  Update 10/25/2017: Her headaches are doing better since the sphenocath procedure and the trigger point injections.    She still has some headache pain but it is not severe.    Pain sometimes builds up some as the day goes on, especially at work.     She denies any numbness, weakness or gait change.  She sees [facility] for her h/o central retinal vein occlusion.  She is still on eye drops.      She only had one seizure 3 years ago and is not on any anticonvulsant.       She has some insomnia.    She has trouble falling asleep and staying asleep.   She changed her clonazepma to 0.25 qAM and 0.75 mg qPM.  She has ovarian cancer and had surgery.   She had ovarian torsion but pathology showed possible cancer so she is going to have another operation.      Update 06/27/2017: HA's worsened again last week.    The conjunctivitis returned the next day.   All the pain is on her right side only.     In the past sphenopalatine ganglion blocks have helped the most.    She also has received some benefit form ONB/TPIs in the past.    She denies any additional seizures.      Insomnia was doing better and was helped by clonazepam.  However, she is doing worse since the headache has recurred.  Clonazepam also helps her anxiety.   Update 04/20/2017: Headaches are doing fairly well.   She is now having 3-4 HA's a month.    Verapamil has helped reduce the frequency of headaches.     When needed, Imitrex often helps.      She will sometimes try Tylenol first.   Due to low platelets, she can't take NSAIDs.     The TCPenia was felt to be due to fibroids and these are being treated.     She still gets redness in the right eye, not always associated with HA.    In the past when her headaches were more severe, she had the Sphenocath procedure and it was very beneficial.    Occipital nerve blocks or TPI's have also been beneficial in the past.    She has not had any more seizure.   She just had one in 2016   From 07/11/2016: HA:   Her headaches were doing much better but they are have been occurring more the past month or two. Additionally she continues to have conjunctival redness on the right.     They greatly improved after starting Verapamil and having occipital nerve block/splenius capitis muscle injection at the first visit and then [place] at the second, third and fourth visits.  After the second Sphenocath, the conjunctival redness got much better but then returned. After the fourth procedure, she went nearly pain-free for 6 months until milder headaches returned and activities slowly worsening. Pain improves  with Imitrex and rest much of  the time.  She no longer can take NSAID    She tolerates the verapamil well but has occasional constipation .     Studies:   MRI of the brain was essentially normal with just a single small T2/FLAIR hyperintense focus in the left frontal subcortical white matter. The MR venogram was normal. ESR and ANA were normal.    Mood:  Anxiety is much better.   Clonazepam with Buspar has helped better than the escitalopram.    Seizure:   In March 2016, she had a seizure.   She had an aura of numbness in her right hand and then passed out.   She is not sure there was any generalized tonic-clonic activity. Her children did not tell her that she had any. However, she did have urinary incontinence. She does not remember much that day.    Since then, she has not had any more seizures.  She has not  needed any AED as she had only the one seizure/spell.       Insomnia:   She is sleeping better since starting clonazepam and having much less headache  Thrombocytopenia:   Her platelet count was reduced to 78,000 and she saw her doctor Angel Rodriguez in hematology. Repeat platelet count was 83,000. She was advised to stop anti-inflammatories.  REVIEW OF SYSTEMS: Constitutional: No fevers, chills, sweats, or change in appetite.  She has insomnia. Eyes: as above Ear, nose and throat: No hearing loss, ear pain, nasal congestion, sore throat Cardiovascular: No chest pain, palpitations Respiratory: No shortness of breath at rest or with exertion.   No wheezes GastrointestinaI: No nausea, vomiting, diarrhea, abdominal pain, fecal incontinence Genitourinary: No dysuria, urinary retention or frequency.  No nocturia.   Possible ovarian cancer Musculoskeletal: No neck pain, back pain Integumentary: No rash, pruritus, skin lesions Neurological: as above Psychiatric: No depression at this time.  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 Allergic/Immunologic: No itchy/runny eyes, nasal congestion, recent allergic reactions, rashes  ALLERGIES: Allergies  Allergen Reactions  . Ivp Dye [Iodinated Diagnostic Agents] Hives, Itching and Swelling  . Bee Venom Swelling    HOME MEDICATIONS:  Current Outpatient Medications:  .  acetaminophen-codeine (TYLENOL #3) 300-30 MG tablet, Take 1-2 tablets by mouth every 4 (four) hours as needed for moderate pain or severe pain (for post op pain)., Disp: 30 tablet, Rfl: 0 .  atropine 1 % ophthalmic solution, Place 1 drop into the right eye 3 (three) times daily. , Disp: , Rfl:  .  brimonidine (ALPHAGAN P) 0.1 % SOLN, Place 1 drop into the right eye every 8 (eight)  hours. , Disp: , Rfl:  .  calcium carbonate (TUMS EX) 750 MG chewable tablet, Chew 2 tablets by mouth as needed for heartburn., Disp: , Rfl:  .  clonazePAM (KLONOPIN) 0.5 MG tablet, Take 1 tablet (0.5 mg total) by mouth 2 (two) times daily as needed. for anxiety, Disp: 180 tablet, Rfl: 1 .  Cyanocobalamin (VITAMIN B-12 PO), Take 1 tablet by mouth daily. , Disp: , Rfl:  .  dorzolamide-timolol (COSOPT) 22.3-6.8 MG/ML ophthalmic solution, Place 1 drop into the right eye 2 (two) times daily. , Disp: , Rfl:  .  fluticasone (FLONASE) 50 MCG/ACT nasal spray, Place 1-2 sprays into both nostrils daily., Disp: , Rfl:  .  latanoprost (XALATAN) 0.005 % ophthalmic solution, Place 1  drop into both eyes at bedtime., Disp: , Rfl:  .  Multiple Vitamin (MULTI-VITAMINS) TABS, Take 1 tablet by mouth daily. , Disp: , Rfl:  .  SUMAtriptan (IMITREX) 100 MG tablet, Take 1 tablet (100 mg total) by mouth once as needed for migraine. May repeat in 2 hours if headache persists or recurs., Disp: 18 tablet, Rfl: 3 .  verapamil (CALAN-SR) 240 MG CR tablet, Take 1 tablet (240 mg total) by mouth at bedtime., Disp: 90 tablet, Rfl: 3 .  Vitamin D, Ergocalciferol, (DRISDOL) 50000 units CAPS capsule, Take 1 capsule (50,000 Units total) by mouth every 7 (seven) days., Disp: 12 capsule, Rfl: 0 .  FLUoxetine (PROZAC) 20 MG capsule, Take 1 capsule (20 mg total) by mouth daily., Disp: 90 capsule, Rfl: 2 No current facility-administered medications for this visit.   Facility-Administered Medications Ordered in Other Visits:  .  gadopentetate dimeglumine (MAGNEVIST) injection 16 mL, 16 mL, Intravenous, Once PRN, Amesha Bailey, Nanine Means, MD  PAST MEDICAL HISTORY: Past Medical History:  Diagnosis Date  . Anxiety   . 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 Cluster headaches    started after seizure  . Depression   . Fibroids    s/p TLH, bilateral salpingectomy  . GERD (gastroesophageal reflux disease)    patient thinks due to  medications  . History of anemia    prior to hysterectomy  . History of pneumonia   . History of trichomoniasis   . Seizures ([place])    once saw neurologist, full body brain MRI and another 6 months lster  . Status post right oophorectomy   . Thrombocytopenia ([place])    sees [place] with Heme Onc    PAST SURGICAL HISTORY: Past Surgical History:  Procedure Laterality Date  . CYSTOSCOPY N/A 07/31/2017   Procedure: CYSTOSCOPY;  Surgeon: Megan Salon, MD;  Location: [facility];  Service: Gynecology;  Laterality: N/A;  . ENDOMETRIAL ABLATION    . EYE SURGERY     laser - removed blood from right eye  . HYSTEROSCOPY W/D&C    . LAPAROSCOPY N/A 09/19/2017   Procedure: LAPAROSCOPY DIAGNOSTIC WITH RIGHT OOPHERECTOMY, LYSIS OF ADHESIONS;  Surgeon: Salvadore Dom, MD;  Location: [facility];  Service: Gynecology;  Laterality: N/A;  . TOTAL LAPAROSCOPIC HYSTERECTOMY WITH SALPINGECTOMY Bilateral 07/31/2017   Procedure: TOTAL LAPAROSCOPIC HYSTERECTOMY WITH SALPINGECTOMY;  Surgeon: Megan Salon, MD;  Location: [facility];  Service: Gynecology;  Laterality: Bilateral;  20 week size uterus/ Alexis bag in room/ need 4.5 hours  . TUBAL LIGATION    . WISDOM TOOTH EXTRACTION      FAMILY HISTORY: Family History  Problem Relation Age of Onset  . Alcoholism Father   . Cirrhosis Father   . Cancer Maternal Grandmother        unsure of type; died of other causes  . Heart disease Maternal Aunt     SOCIAL HISTORY:  Social History   Socioeconomic History  . Marital status: Single    Spouse name: Not on file  . Number of children: Not on file  . Years of education: Not on file  . Highest education level: Not on file  Occupational History  . Not on file  Social Needs  . Financial resource strain: Not on file  . Food insecurity:    Worry: Not on file    Inability: Not on file  . Transportation needs:    Medical: Not on file    Non-medical: Not on file  Tobacco Use  . Smoking status: Current Some Day  Smoker    Years: 20.00    Types: E-cigarettes, Cigarettes  . Smokeless tobacco: Never Used  . Tobacco comment: occasional cigerate and vap  Substance and Sexual Activity  . Alcohol use: Yes    Comment:  10/month  . Drug use: No  . Sexual activity: Yes    Birth control/protection: Surgical    Comment: hysterectomy  Lifestyle  . Physical activity:    Days per week: Not on file    Minutes per session: Not on file  . Stress: Not on file  Relationships  . Social connections:    Talks on phone: Not on file    Gets together: Not on file    Attends religious service: Not on file    Active member of club or organization: Not on file    Attends meetings of clubs or organizations: Not on file    Relationship status: Not on file  . Intimate partner violence:    Fear of current or ex partner: Not on file    Emotionally abused: Not on file    Physically abused: Not on file    Forced sexual activity: Not on file  Other Topics Concern  . Not on file  Social History Narrative  . Not on file     PHYSICAL EXAM  Vitals:   10/25/17 1526  BP: 121/71  Pulse: (!) 59  Resp: 18  Weight: 174 lb 8 oz (79.2 kg)  Height: 5' (1.524 m)    Body mass index is 34.08 kg/m.   General: The patient is well-developed and well-nourished and in no acute distress  Eyes:    She has conjunctival erythema.  Funduscopic examination did not show any papilledema  Neck: The neck is supple. she is tender over the right occiput and splenius capitis muscle.  Range of motion is fairly normal   Neurologic Exam  Mental status: The patient is alert and oriented x 3 at the time of the examination. The patient has apparent normal recent and remote memory, with an apparently normal attention span and concentration ability.   Speech is normal.  Cranial nerves: Extraocular movements are full.  There is mild right ptosis with normal facial strength elsewhere.. There is normal facial sensation to soft touch.    Trapezius and sternocleidomastoid strength is normal. No dysarthria is noted.  The tongue is midline, and the patient has symmetric elevation of the soft palate. No obvious hearing deficits are noted.  Motor:  Muscle bulk is normal.   Tone is normal. Strength is  5 / 5 in all 4 extremities.   Sensory: She has normal sensation to touch and vibration in the arms and legs   Gait and station: Station is normal.   Gait is normal. Tandem gait is normal. Romberg is negative.       DIAGNOSTIC DATA (LABS, IMAGING, TESTING) - I reviewed patient records, labs, notes, testing and imaging myself where available.  Lab Results  Component Value Date   WBC 9.5 09/20/2017   HGB 11.5 (L) 09/20/2017   HCT 32.7 (L) 09/20/2017   MCV 87.9 09/20/2017   PLT 60 (L) 09/20/2017      Component Value Date/Time   NA 138 09/19/2017 0739   NA 138 08/16/2017 1627   NA 138 08/31/2016 0957   K 3.9 09/19/2017 0739   K 3.9 08/31/2016 0957   CL 103 09/19/2017 0739   CO2 25 09/19/2017 0739   CO2  22 08/31/2016 0957   GLUCOSE 131 (H) 09/19/2017 0739   GLUCOSE 106 08/31/2016 0957   BUN 7 09/19/2017 0739   BUN 8 08/16/2017 1627   BUN 8.6 08/31/2016 0957   CREATININE 0.70 09/19/2017 0739   CREATININE 0.8 08/31/2016 0957   CALCIUM 10.2 09/19/2017 0739   CALCIUM 10.3 08/31/2016 0957   PROT 7.6 09/19/2017 0739   PROT 7.2 08/16/2017 1627   PROT 7.6 08/31/2016 0957   ALBUMIN 4.2 09/19/2017 0739   ALBUMIN 4.6 08/16/2017 1627   ALBUMIN 3.9 08/31/2016 0957   AST 26 09/19/2017 0739   AST 18 08/31/2016 0957   ALT 33 09/19/2017 0739   ALT 23 08/31/2016 0957   ALKPHOS 49 09/19/2017 0739   ALKPHOS 62 08/31/2016 0957   BILITOT 0.5 09/19/2017 0739   BILITOT 0.3 08/16/2017 1627   BILITOT 0.43 08/31/2016 0957   GFRNONAA >60 09/19/2017 0739   GFRAA >60 09/19/2017 0739         ASSESSMENT AND PLAN  Vascular headache  Convulsions, unspecified convulsion type (HCC)  Anxiety state  Insomnia, unspecified  type  Central retinal vein occlusion of right eye, unspecified complication status    1.   Her pain is doing well.  Continue verapamil and take Imitrex for Tylenol 3 as needed for migraine..  2.   Clonazepam 0.5 mg twice a day for anxiety and insomnia.     She is having more anxiety especially with the recent [place] pathology.  I will have her start fluoxetine. 3.  She will return in 6 months or sooner if there are new or worsening symptoms.  Peggi Yono A. Felecia Shelling, MD, PhD 2/40/9735, 3:29 PM Certified in Neurology, Clinical Neurophysiology, Sleep Medicine, Pain Medicine and Neuroimaging  [facility] [address] [place], [place] [address] [phone] [phone]

## 2017-10-26 ENCOUNTER — Inpatient Hospital Stay: Payer: PRIVATE HEALTH INSURANCE | Attending: Hematology | Admitting: Obstetrics

## 2017-10-26 ENCOUNTER — Encounter: Payer: Self-pay | Admitting: Obstetrics

## 2017-10-26 VITALS — BP 117/73 | HR 47 | Temp 97.7°F | Resp 20 | Ht 60.0 in | Wt 172.2 lb

## 2017-10-26 DIAGNOSIS — Z90710 Acquired absence of both cervix and uterus: Secondary | ICD-10-CM | POA: Diagnosis not present

## 2017-10-26 DIAGNOSIS — Z90721 Acquired absence of ovaries, unilateral: Secondary | ICD-10-CM | POA: Diagnosis not present

## 2017-10-26 DIAGNOSIS — C561 Malignant neoplasm of right ovary: Secondary | ICD-10-CM | POA: Insufficient documentation

## 2017-10-26 NOTE — H&P (View-Only) (Signed)
[place] at [facility]   Progress Note: Established Patient Follow-Up Visit   Consult was originally requested by Dr. 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   HPI: [name]  is a very nice 42 y.o.  P3  . Interval History Since her last visit she we had her presented at [place] and had the pathology reviewed by [facility]. 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 Final recommendations agree on contralateral ovary removal, omentectomy, and washings and if negative observation.   We will be discussing these recommendations today. Given I did not do a pelvic on her initial visit she is having that today.  . Presenting History (Oncologic Course if applicable) She noted menstrual-like bleeding (or more) starting August 2018. She had previously had endometrial ablation years prior. In January 2019 the bleeding worsened and she had a hemoglobin down to 7 noting she had to wear "diapers".   She has been seen by Heme-Onc for thrombocytopenia with platelets down into the 70/80's for the last couple of years even when there was no associated anemia.  Gyn followup revealed fibroids and discussion was had for surgery following Lupron. She then underwent TLH/Bilateral salpingectomy 07/31/17. A few weeks postop she noted signficant RLQ pain and nausea/vomiting. Workup with CTScan and ultrasound revealed a large adnexal mass (8.8 x 7.5 x 7.4 cm. There are multiple cysts measuring up to 3.8 x 3.5 x 2.5 cm. The ovary is markedly enlarged with increased echogenicity consistent with edema) and suspected torsion so she was taken for an urgent right oophorectomy 09/19/17. It is of note the right adnexa was grossly normal at the time of her prior surgery however after the  torsion/hemorrhage/necrosis the right adnexa was found to be enlarged up to 8cm as noted above. No evidence of metastatic disease on the CT done 09/19/17.  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 Surface involvement cannot be determined due to (controlled) capsule rupture in an endocatch bag to deliver through the laparoscopic incisions.  In addition no other staging or washings was performed (given the normal prior appearance of the ovary).  Imported EPIC Oncologic History:   No history exists.    ECOG PERFORMANCE STATUS: 0 - Asymptomatic  Measurement of disease:  . Nothing done preoperatively . We'll follow CA125 in followup  Radiology: No results found. .   Outpatient Encounter Medications as of 10/26/2017  Medication Sig  . acetaminophen-codeine (TYLENOL #3) 300-30 MG tablet Take 1-2 tablets by mouth every 4 (four) hours as needed for moderate pain or severe pain (for post op pain).  [name] atropine 1 % ophthalmic solution Place 1 drop into the right eye 3 (three) times daily.   . brimonidine (ALPHAGAN P) 0.1 % SOLN Place 1 drop into the right eye every 8 (eight) hours.   . calcium carbonate (TUMS EX) 750 MG chewable tablet Chew 2 tablets by mouth as needed for heartburn.  . clonazePAM (KLONOPIN) 0.5 MG tablet Take 1 tablet (0.5 mg total) by mouth 2 (two) times daily as needed. for anxiety  . Cyanocobalamin (VITAMIN B-12 PO) Take 1 tablet by mouth daily.   . dorzolamide-timolol (COSOPT) 22.3-6.8 MG/ML ophthalmic solution Place 1 drop into  the right eye 2 (two) times daily.   [name] FLUoxetine (PROZAC) 20 MG capsule Take 1 capsule (20 mg total) by mouth daily.  . fluticasone (FLONASE) 50 MCG/ACT nasal spray Place 1-2 sprays into both nostrils daily.  [name] latanoprost (XALATAN) 0.005 % ophthalmic solution Place 1 drop into both eyes at bedtime.  . Multiple  Vitamin (MULTI-VITAMINS) TABS Take 1 tablet by mouth daily.   . SUMAtriptan (IMITREX) 100 MG tablet Take 1 tablet (100 mg total) by mouth once as needed for migraine. May repeat in 2 hours if headache persists or recurs.  . verapamil (CALAN-SR) 240 MG CR tablet Take 1 tablet (240 mg total) by mouth at bedtime.  . Vitamin D, Ergocalciferol, (DRISDOL) 50000 units CAPS capsule Take 1 capsule (50,000 Units total) by mouth every 7 (seven) days.   Facility-Administered Encounter Medications as of 10/26/2017  Medication  . gadopentetate dimeglumine (MAGNEVIST) injection 16 mL   Allergies  Allergen Reactions  . Ivp Dye [Iodinated Diagnostic Agents] Hives, Itching and Swelling  . Bee Venom Swelling    Past Medical History:  Diagnosis Date  . Anxiety   . 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 Cluster headaches    started after seizure  . Depression   . Fibroids    s/p TLH, bilateral salpingectomy  . GERD (gastroesophageal reflux disease)    patient thinks due to medications  . History of anemia    prior to hysterectomy  . History of pneumonia   . History of trichomoniasis   . Seizures ([place])    once saw neurologist, full body brain MRI and another 6 months lster  . Status post right oophorectomy   . Thrombocytopenia ([place])    sees [place] with Heme Onc   Past Surgical History:  Procedure Laterality Date  . CYSTOSCOPY N/A 07/31/2017   Procedure: CYSTOSCOPY;  Surgeon: Megan Salon, MD;  Location: [facility];  Service: Gynecology;  Laterality: N/A;  . ENDOMETRIAL ABLATION    . EYE SURGERY     laser - removed blood from right eye  . HYSTEROSCOPY W/D&C    . LAPAROSCOPY N/A 09/19/2017   Procedure: LAPAROSCOPY DIAGNOSTIC WITH RIGHT OOPHERECTOMY, LYSIS OF ADHESIONS;  Surgeon: Salvadore Dom, MD;  Location: [facility];  Service: Gynecology;  Laterality: N/A;  . TOTAL LAPAROSCOPIC HYSTERECTOMY WITH SALPINGECTOMY Bilateral 07/31/2017   Procedure: TOTAL LAPAROSCOPIC HYSTERECTOMY WITH  SALPINGECTOMY;  Surgeon: Megan Salon, MD;  Location: [facility];  Service: Gynecology;  Laterality: Bilateral;  20 week size uterus/ Alexis bag in room/ need 4.5 hours  . TUBAL LIGATION    . WISDOM TOOTH EXTRACTION          Past Gynecological History:   Patient's last menstrual period was 05/02/2017. . Menarche: 42 years old . P 3 . Contraceptive H/o OCP . HRT none  . Last Pap 03/2017 negative with neg HRHPV Family Hx:  Family History  Problem Relation Age of Onset  . Alcoholism Father   . Cirrhosis Father   . Cancer Maternal Grandmother        unsure of type; died of other causes  . Heart disease Maternal Aunt    Social Hx:  [name] Tobacco use: current some days and vap . Alcohol use: 10/month . Illicit Drug use: none . Illicit IV Drug use: none    Review of Systems: Review of Systems - Oncology  Vitals: Blood pressure (!) 103/55, pulse 64, temperature 98.7 F (37.1 C), temperature source Oral, resp. rate  20, height 5' (1.524 m), weight 173 lb 11.2 oz (78.8 kg), last menstrual period 05/02/2017, SpO2 100 %. There were no vitals filed for this visit.  There is no height or weight on file to calculate BMI.   Physical Exam: General :  Well developed, 42 y.o., female in no apparent distress HEENT:  Normocephalic/atraumatic, symmetric, EOMI, eyelids normal Neck:   Supple, no masses.  Lymphatics:  No cervical/ submandibular/ supraclavicular/ infraclavicular/ inguinal adenopathy Respiratory:  Respirations unlabored, no use of accessory muscles CV:   Deferred Breast:  Deferred Musculoskeletal: No CVA tenderness, normal muscle strength. Abdomen:  Soft, non-tender and nondistended. No evidence of hernia. No masses. Wounds healed Extremities:  No lymphedema, no erythema, non-tender. Skin:   Normal inspection Neuro/Psych:  No focal motor deficit, no abnormal mental status. Normal gait. Normal affect. Alert and oriented to person, place, and time  Genito Urinary: Vulva: Normal  external female genitalia.  Bladder/urethra: Urethral meatus normal in size and location. No lesions or   masses, well supported bladder Speculum exam: Vagina: No lesion, no discharge, no bleeding. Bimanual exam: Cervix/Uterus: Surgically absent  Adnexa: No masses. Rectovaginal:  Good tone, no masses, no cul de sac nodularity, no parametrial involvement or nodularity.   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 Question borderline versus low grade serous o These are 2 different lesions  o As suspected the terminology "non-invasive" implies favoring borderline over "invasive low grade serous ovarian CA" 2. Micropapillary variant o This increases the risk for invasive implants, but is not an indication by itself for chemotherapy 3. We did discuss the possibility of chemotherapy if this is a low grade serous CA and any extraovarian disease is detected at time of surgery; also reviewed the data on invasive implants. 4. Blood in pelvis at surgery 09/19/17 o This does not indicate absolutely there was rupture and I would not upstage her based on this o I will plan to get washings at our surgery 5. In surveillance we'll follow CA125, although it was not checked preop due to lack of suspicion of malignancy 6. Management of ovarian cancer o This was unanticipated. Her preoperative imaging both times did not indicate a lesion of concern o However she has the left ovary in-situ and has completed child-bearing o My recommendation is for removal of the remaining ovary and we can do washings and omentectomy while we are there. 7. Surgical discussion o We discussed today that given the prior surgeries her risk for adhesive disease is high thus increased risk of visceral organ injury including but not limited to the bowel and urinary tract. o We will attempt laparoscopic resection but again with adhesive disease there may be  a risk for laparotomy. o We reviewed the surgical sketch and the risks, benefits, and alternatives and she was given a copy today 8. RTC 10-14 days post for pathology review and wound check.   Isabel Caprice, MD  10/26/2017, 11:49 AM    Cc: Sumner Boast, MD (Referring Ob/Gyn) none (PCP)

## 2017-10-26 NOTE — Progress Notes (Signed)
[place] at [facility]   Progress Note: Established Patient Follow-Up Visit   Consult was originally requested by Dr. 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   HPI: [name]  is a very nice 42 y.o.  P3  . Interval History Since her last visit she we had her presented at [place] and had the pathology reviewed by [facility]. 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 Final recommendations agree on contralateral ovary removal, omentectomy, and washings and if negative observation.   We will be discussing these recommendations today. Given I did not do a pelvic on her initial visit she is having that today.  . Presenting History (Oncologic Course if applicable) She noted menstrual-like bleeding (or more) starting August 2018. She had previously had endometrial ablation years prior. In January 2019 the bleeding worsened and she had a hemoglobin down to 7 noting she had to wear "diapers".   She has been seen by Heme-Onc for thrombocytopenia with platelets down into the 70/80's for the last couple of years even when there was no associated anemia.  Gyn followup revealed fibroids and discussion was had for surgery following Lupron. She then underwent TLH/Bilateral salpingectomy 07/31/17. A few weeks postop she noted signficant RLQ pain and nausea/vomiting. Workup with CTScan and ultrasound revealed a large adnexal mass (8.8 x 7.5 x 7.4 cm. There are multiple cysts measuring up to 3.8 x 3.5 x 2.5 cm. The ovary is markedly enlarged with increased echogenicity consistent with edema) and suspected torsion so she was taken for an urgent right oophorectomy 09/19/17. It is of note the right adnexa was grossly normal at the time of her prior surgery however after the  torsion/hemorrhage/necrosis the right adnexa was found to be enlarged up to 8cm as noted above. No evidence of metastatic disease on the CT done 09/19/17.  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 Surface involvement cannot be determined due to (controlled) capsule rupture in an endocatch bag to deliver through the laparoscopic incisions.  In addition no other staging or washings was performed (given the normal prior appearance of the ovary).  Imported EPIC Oncologic History:   No history exists.    ECOG PERFORMANCE STATUS: 0 - Asymptomatic  Measurement of disease:  . Nothing done preoperatively . We'll follow CA125 in followup  Radiology: No results found. .   Outpatient Encounter Medications as of 10/26/2017  Medication Sig  . acetaminophen-codeine (TYLENOL #3) 300-30 MG tablet Take 1-2 tablets by mouth every 4 (four) hours as needed for moderate pain or severe pain (for post op pain).  [name] atropine 1 % ophthalmic solution Place 1 drop into the right eye 3 (three) times daily.   . brimonidine (ALPHAGAN P) 0.1 % SOLN Place 1 drop into the right eye every 8 (eight) hours.   . calcium carbonate (TUMS EX) 750 MG chewable tablet Chew 2 tablets by mouth as needed for heartburn.  . clonazePAM (KLONOPIN) 0.5 MG tablet Take 1 tablet (0.5 mg total) by mouth 2 (two) times daily as needed. for anxiety  . Cyanocobalamin (VITAMIN B-12 PO) Take 1 tablet by mouth daily.   . dorzolamide-timolol (COSOPT) 22.3-6.8 MG/ML ophthalmic solution Place 1 drop into  the right eye 2 (two) times daily.   [name] FLUoxetine (PROZAC) 20 MG capsule Take 1 capsule (20 mg total) by mouth daily.  . fluticasone (FLONASE) 50 MCG/ACT nasal spray Place 1-2 sprays into both nostrils daily.  [name] latanoprost (XALATAN) 0.005 % ophthalmic solution Place 1 drop into both eyes at bedtime.  . Multiple  Vitamin (MULTI-VITAMINS) TABS Take 1 tablet by mouth daily.   . SUMAtriptan (IMITREX) 100 MG tablet Take 1 tablet (100 mg total) by mouth once as needed for migraine. May repeat in 2 hours if headache persists or recurs.  . verapamil (CALAN-SR) 240 MG CR tablet Take 1 tablet (240 mg total) by mouth at bedtime.  . Vitamin D, Ergocalciferol, (DRISDOL) 50000 units CAPS capsule Take 1 capsule (50,000 Units total) by mouth every 7 (seven) days.   Facility-Administered Encounter Medications as of 10/26/2017  Medication  . gadopentetate dimeglumine (MAGNEVIST) injection 16 mL   Allergies  Allergen Reactions  . Ivp Dye [Iodinated Diagnostic Agents] Hives, Itching and Swelling  . Bee Venom Swelling    Past Medical History:  Diagnosis Date  . Anxiety   . 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 Cluster headaches    started after seizure  . Depression   . Fibroids    s/p TLH, bilateral salpingectomy  . GERD (gastroesophageal reflux disease)    patient thinks due to medications  . History of anemia    prior to hysterectomy  . History of pneumonia   . History of trichomoniasis   . Seizures ([place])    once saw neurologist, full body brain MRI and another 6 months lster  . Status post right oophorectomy   . Thrombocytopenia ([place])    sees [place] with Heme Onc   Past Surgical History:  Procedure Laterality Date  . CYSTOSCOPY N/A 07/31/2017   Procedure: CYSTOSCOPY;  Surgeon: Megan Salon, MD;  Location: [facility];  Service: Gynecology;  Laterality: N/A;  . ENDOMETRIAL ABLATION    . EYE SURGERY     laser - removed blood from right eye  . HYSTEROSCOPY W/D&C    . LAPAROSCOPY N/A 09/19/2017   Procedure: LAPAROSCOPY DIAGNOSTIC WITH RIGHT OOPHERECTOMY, LYSIS OF ADHESIONS;  Surgeon: Salvadore Dom, MD;  Location: [facility];  Service: Gynecology;  Laterality: N/A;  . TOTAL LAPAROSCOPIC HYSTERECTOMY WITH SALPINGECTOMY Bilateral 07/31/2017   Procedure: TOTAL LAPAROSCOPIC HYSTERECTOMY WITH  SALPINGECTOMY;  Surgeon: Megan Salon, MD;  Location: [facility];  Service: Gynecology;  Laterality: Bilateral;  20 week size uterus/ Alexis bag in room/ need 4.5 hours  . TUBAL LIGATION    . WISDOM TOOTH EXTRACTION          Past Gynecological History:   Patient's last menstrual period was 05/02/2017. . Menarche: 42 years old . P 3 . Contraceptive H/o OCP . HRT none  . Last Pap 03/2017 negative with neg HRHPV Family Hx:  Family History  Problem Relation Age of Onset  . Alcoholism Father   . Cirrhosis Father   . Cancer Maternal Grandmother        unsure of type; died of other causes  . Heart disease Maternal Aunt    Social Hx:  [name] Tobacco use: current some days and vap . Alcohol use: 10/month . Illicit Drug use: none . Illicit IV Drug use: none    Review of Systems: Review of Systems - Oncology  Vitals: Blood pressure (!) 103/55, pulse 64, temperature 98.7 F (37.1 C), temperature source Oral, resp. rate  20, height 5' (1.524 m), weight 173 lb 11.2 oz (78.8 kg), last menstrual period 05/02/2017, SpO2 100 %. There were no vitals filed for this visit.  There is no height or weight on file to calculate BMI.   Physical Exam: General :  Well developed, 42 y.o., female in no apparent distress HEENT:  Normocephalic/atraumatic, symmetric, EOMI, eyelids normal Neck:   Supple, no masses.  Lymphatics:  No cervical/ submandibular/ supraclavicular/ infraclavicular/ inguinal adenopathy Respiratory:  Respirations unlabored, no use of accessory muscles CV:   Deferred Breast:  Deferred Musculoskeletal: No CVA tenderness, normal muscle strength. Abdomen:  Soft, non-tender and nondistended. No evidence of hernia. No masses. Wounds healed Extremities:  No lymphedema, no erythema, non-tender. Skin:   Normal inspection Neuro/Psych:  No focal motor deficit, no abnormal mental status. Normal gait. Normal affect. Alert and oriented to person, place, and time  Genito Urinary: Vulva: Normal  external female genitalia.  Bladder/urethra: Urethral meatus normal in size and location. No lesions or   masses, well supported bladder Speculum exam: Vagina: No lesion, no discharge, no bleeding. Bimanual exam: Cervix/Uterus: Surgically absent  Adnexa: No masses. Rectovaginal:  Good tone, no masses, no cul de sac nodularity, no parametrial involvement or nodularity.   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 Question borderline versus low grade serous o These are 2 different lesions  o As suspected the terminology "non-invasive" implies favoring borderline over "invasive low grade serous ovarian CA" 2. Micropapillary variant o This increases the risk for invasive implants, but is not an indication by itself for chemotherapy 3. We did discuss the possibility of chemotherapy if this is a low grade serous CA and any extraovarian disease is detected at time of surgery; also reviewed the data on invasive implants. 4. Blood in pelvis at surgery 09/19/17 o This does not indicate absolutely there was rupture and I would not upstage her based on this o I will plan to get washings at our surgery 5. In surveillance we'll follow CA125, although it was not checked preop due to lack of suspicion of malignancy 6. Management of ovarian cancer o This was unanticipated. Her preoperative imaging both times did not indicate a lesion of concern o However she has the left ovary in-situ and has completed child-bearing o My recommendation is for removal of the remaining ovary and we can do washings and omentectomy while we are there. 7. Surgical discussion o We discussed today that given the prior surgeries her risk for adhesive disease is high thus increased risk of visceral organ injury including but not limited to the bowel and urinary tract. o We will attempt laparoscopic resection but again with adhesive disease there may be  a risk for laparotomy. o We reviewed the surgical sketch and the risks, benefits, and alternatives and she was given a copy today 8. RTC 10-14 days post for pathology review and wound check.   Isabel Caprice, MD  10/26/2017, 11:49 AM    Cc: Sumner Boast, MD (Referring Ob/Gyn) none (PCP)

## 2017-10-26 NOTE — Patient Instructions (Signed)
Preparing for your Surgery  Plan for surgery on November 21, 2017 with Dr. Precious Haws at [facility].  You will be scheduled for a robotic assisted unilateral salpingo-oophorectomy, omentectomy, washings.  Pre-operative Testing -You will receive a phone call from presurgical testing at [facility] to arrange for a pre-operative testing appointment before your surgery.  This appointment normally occurs one to two weeks before your scheduled surgery.   -Bring your insurance card, copy of an advanced directive if applicable, medication list  -At that visit, you will be asked to sign a consent for a possible blood transfusion in case a transfusion becomes necessary during surgery.  The need for a blood transfusion is rare but having consent is a necessary part of your care.     -You should not be taking blood thinners or aspirin at least ten days prior to surgery unless instructed by your surgeon.  Day Before Surgery at [place] will be asked to take in a light diet the day before surgery.  Avoid carbonated beverages.  You will be advised to have nothing to eat or drink after midnight the evening before.    Eat a light diet the day before surgery.  Examples including soups, broths, toast, yogurt, mashed potatoes.  Things to avoid include carbonated beverages (fizzy beverages), raw fruits and raw vegetables, or beans.   If your bowels are filled with gas, your surgeon will have difficulty visualizing your pelvic organs which increases your surgical risks.  Your role in recovery Your role is to become active as soon as directed by your doctor, while still giving yourself time to heal.  Rest when you feel tired. You will be asked to do the following in order to speed your recovery:  - Cough and breathe deeply. This helps toclear and expand your lungs and can prevent pneumonia. You may be given a spirometer to practice deep breathing. A staff member will show you how to use the  spirometer. - Do mild physical activity. Walking or moving your legs help your circulation and body functions return to normal. A staff member will help you when you try to walk and will provide you with simple exercises. Do not try to get up or walk alone the first time. - Actively manage your pain. Managing your pain lets you move in comfort. We will ask you to rate your pain on a scale of zero to 10. It is your responsibility to tell your doctor or nurse where and how much you hurt so your pain can be treated.  Special Considerations -If you are diabetic, you may be placed on insulin after surgery to have closer control over your blood sugars to promote healing and recovery.  This does not mean that you will be discharged on insulin.  If applicable, your oral antidiabetics will be resumed when you are tolerating a solid diet.  -Your final pathology results from surgery should be available by the Friday after surgery and the results will be relayed to you when available.  -Dr. Lahoma Crocker or Dr. Everitt Amber are the Surgeons that assist your GYN Oncologist with surgery.  The next day after your surgery you will either see your GYN Oncologist or Dr. Lahoma Crocker.   Blood Transfusion Information WHAT IS A BLOOD TRANSFUSION? A transfusion is the replacement of blood or some of its parts. Blood is made up of multiple cells which provide different functions.  Red blood cells carry oxygen and are used for blood loss  replacement.  White blood cells fight against infection.  Platelets control bleeding.  Plasma helps clot blood.  Other blood products are available for specialized needs, such as hemophilia or other clotting disorders. BEFORE THE TRANSFUSION  Who gives blood for transfusions?   You may be able to donate blood to be used at a later date on yourself (autologous donation).  Relatives can be asked to donate blood. This is generally not any safer than if you have received  blood from a stranger. The same precautions are taken to ensure safety when a relative's blood is donated.  Healthy volunteers who are fully evaluated to make sure their blood is safe. This is blood bank blood. Transfusion therapy is the safest it has ever been in the practice of medicine. Before blood is taken from a donor, a complete history is taken to make sure that person has no history of diseases nor engages in risky social behavior (examples are intravenous drug use or sexual activity with multiple partners). The donor's travel history is screened to minimize risk of transmitting infections, such as malaria. The donated blood is tested for signs of infectious diseases, such as HIV and hepatitis. The blood is then tested to be sure it is compatible with you in order to minimize the chance of a transfusion reaction. If you or a relative donates blood, this is often done in anticipation of surgery and is not appropriate for emergency situations. It takes many days to process the donated blood. RISKS AND COMPLICATIONS Although transfusion therapy is very safe and saves many lives, the main dangers of transfusion include:   Getting an infectious disease.  Developing a transfusion reaction. This is an allergic reaction to something in the blood you were given. Every precaution is taken to prevent this. The decision to have a blood transfusion has been considered carefully by your caregiver before blood is given. Blood is not given unless the benefits outweigh the risks.

## 2017-10-29 ENCOUNTER — Telehealth: Payer: Self-pay | Admitting: *Deleted

## 2017-10-29 MED ORDER — CLONAZEPAM 0.5 MG PO TABS: 0.5000 mg | ORAL_TABLET | Freq: Two times a day (BID) | ORAL | 1 refills | Status: DC | PRN

## 2017-10-29 NOTE — Telephone Encounter (Signed)
Med refill

## 2017-10-30 ENCOUNTER — Telehealth: Payer: Self-pay | Admitting: Neurology

## 2017-10-30 MED ORDER — VERAPAMIL HCL ER 240 MG PO TBCR: 240.0000 mg | EXTENDED_RELEASE_TABLET | Freq: Every day | ORAL | 3 refills | Status: DC

## 2017-10-30 NOTE — Telephone Encounter (Signed)
Pt states that the refill for her verapamil (CALAN-SR) 240 MG CR tablet was never sent to pharmacy [id number] [address], [place]. [phone] (Phone) [phone] (Fax)   Pt is asking that RN Faith remembered to request a 90 day for both  verapamil (CALAN-SR) 240 MG CR tablet  and clonazePAM (KLONOPIN) 0.5 MG tablet

## 2017-10-30 NOTE — Telephone Encounter (Signed)
[facility] escrib[facility]fim

## 2017-10-30 NOTE — Addendum Note (Signed)
Addended by: France Ravens I on: 10/30/2017 08:51 AM   Modules accepted: Orders

## 2017-11-12 ENCOUNTER — Inpatient Hospital Stay: Payer: PRIVATE HEALTH INSURANCE | Admitting: Hematology

## 2017-11-12 ENCOUNTER — Inpatient Hospital Stay: Payer: PRIVATE HEALTH INSURANCE | Attending: Hematology

## 2017-11-12 DIAGNOSIS — R300 Dysuria: Secondary | ICD-10-CM | POA: Insufficient documentation

## 2017-11-15 NOTE — Patient Instructions (Addendum)
Angel Rodriguez  11/15/2017   Your procedure is scheduled on: Wednesday 11/21/2017  Report to [facility] Main  Entrance              Report to admitting at  [place] AM    Call this number if you have problems the morning of surgery [phone]    Eat a light diet the day before surgery.  Examples including soups, broths, toast, yogurt, mashed potatoes.  Things to avoid include carbonated beverages (fizzy beverages), raw fruits and raw vegetables, or beans.   If your bowels are filled with gas, your surgeon will have difficulty visualizing your pelvic organs which increases your surgical risks.    CLEAR LIQUID DIET   Foods Allowed                                                                     Foods Excluded  Coffee and tea, regular and decaf                             liquids that you cannot  Plain Jell-O in any flavor                                             see through such as: Fruit ices (not with fruit pulp)                                     milk, soups, orange juice  Iced Popsicles                                    All solid food                                    Cranberry, grape and apple juices Sports drinks like Gatorade Lightly seasoned clear broth or consume(fat free) Sugar, honey syrup  Sample Menu Breakfast                                Lunch                                     Supper Cranberry juice                    Beef broth                            Chicken broth Jell-O  Grape juice                           Apple juice Coffee or tea                        Jell-O                                      Popsicle                                                Coffee or tea                        Coffee or tea  _____________________________________________________________________     Remember: Do not eat food or drink liquids :After Midnight.     Take these medicines the morning of surgery  with A SIP OF WATER: use eye drops, klonopin if needed                                 You may not have any metal on your body including hair pins and              piercings  Do not wear jewelry, make-up, lotions, powders or perfumes, deodorant             Do not wear nail polish.  Do not shave  48 hours prior to surgery.               Do not bring valuables to the hospital. [place].  Contacts, dentures or bridgework may not be worn into surgery.  Leave suitcase in the car. After surgery it may be brought to your room.     Patients discharged the day of surgery will not be allowed to drive home.  Name and phone number of your driver:                Please read over the following fact sheets you were given: _____________________________________________________________________             [facility] - Preparing for Surgery Before surgery, you can play an important role.  Because skin is not sterile, your skin needs to be as free of germs as possible.  You can reduce the number of germs on your skin by washing with CHG (chlorahexidine gluconate) soap before surgery.  CHG is an antiseptic cleaner which kills germs and bonds with the skin to continue killing germs even after washing. Please DO NOT use if you have an allergy to CHG or antibacterial soaps.  If your skin becomes reddened/irritated stop using the CHG and inform your nurse when you arrive at Short Stay. Do not shave (including legs and underarms) for at least 48 hours prior to the first CHG shower.  You may shave your face/neck. Please follow these instructions carefully:  1.  Shower with CHG Soap the night before surgery and the  morning of Surgery.  2.  If you choose to wash your hair,  wash your hair first as usual with your  normal  shampoo.  3.  After you shampoo, rinse your hair and body thoroughly to remove the  shampoo.                           4.  Use CHG as you would  any other liquid soap.  You can apply chg directly  to the skin and wash                       Gently with a scrungie or clean washcloth.  5.  Apply the CHG Soap to your body ONLY FROM THE NECK DOWN.   Do not use on face/ open                           Wound or open sores. Avoid contact with eyes, ears mouth and genitals (private parts).                       Wash face,  Genitals (private parts) with your normal soap.             6.  Wash thoroughly, paying special attention to the area where your surgery  will be performed.  7.  Thoroughly rinse your body with warm water from the neck down.  8.  DO NOT shower/wash with your normal soap after using and rinsing off  the CHG Soap.                9.  Pat yourself dry with a clean towel.            10.  Wear clean pajamas.            11.  Place clean sheets on your bed the night of your first shower and do not  sleep with pets. Day of Surgery : Do not apply any lotions/deodorants the morning of surgery.  Please wear clean clothes to the hospital/surgery center.  FAILURE TO FOLLOW THESE INSTRUCTIONS MAY RESULT IN THE CANCELLATION OF YOUR SURGERY PATIENT SIGNATURE_________________________________  NURSE SIGNATURE__________________________________  ________________________________________________________________________   Angel Rodriguez  An incentive spirometer is a tool that can help keep your lungs clear and active. This tool measures how well you are filling your lungs with each breath. Taking long deep breaths may help reverse or decrease the chance of developing breathing (pulmonary) problems (especially infection) following:  A long period of time when you are unable to move or be active. BEFORE THE PROCEDURE   If the spirometer includes an indicator to show your best effort, your nurse or respiratory therapist will set it to a desired goal.  If possible, sit up straight or lean slightly forward. Try not to slouch.  Hold the  incentive spirometer in an upright position. INSTRUCTIONS FOR USE  1. Sit on the edge of your bed if possible, or sit up as far as you can in bed or on a chair. 2. Hold the incentive spirometer in an upright position. 3. Breathe out normally. 4. Place the mouthpiece in your mouth and seal your lips tightly around it. 5. Breathe in slowly and as deeply as possible, raising the piston or the ball toward the top of the column. 6. Hold your breath for 3-5 seconds or for as long as possible. Allow the piston or ball to fall to the bottom of the  column. 7. Remove the mouthpiece from your mouth and breathe out normally. 8. Rest for a few seconds and repeat Steps 1 through 7 at least 10 times every 1-2 hours when you are awake. Take your time and take a few normal breaths between deep breaths. 9. The spirometer may include an indicator to show your best effort. Use the indicator as a goal to work toward during each repetition. 10. After each set of 10 deep breaths, practice coughing to be sure your lungs are clear. If you have an incision (the cut made at the time of surgery), support your incision when coughing by placing a pillow or rolled up towels firmly against it. Once you are able to get out of bed, walk around indoors and cough well. You may stop using the incentive spirometer when instructed by your caregiver.  RISKS AND COMPLICATIONS  Take your time so you do not get dizzy or light-headed.  If you are in pain, you may need to take or ask for pain medication before doing incentive spirometry. It is harder to take a deep breath if you are having pain. AFTER USE  Rest and breathe slowly and easily.  It can be helpful to keep track of a log of your progress. Your caregiver can provide you with a simple table to help with this. If you are using the spirometer at home, follow these instructions: [place] IF:   You are having difficultly using the spirometer.  You have trouble using  the spirometer as often as instructed.  Your pain medication is not giving enough relief while using the spirometer.  You develop fever of 100.5 F (38.1 C) or higher. SEEK IMMEDIATE MEDICAL CARE IF:   You cough up bloody sputum that had not been present before.  You develop fever of 102 F (38.9 C) or greater.  You develop worsening pain at or near the incision site. MAKE SURE YOU:   Understand these instructions.  Will watch your condition.  Will get help right away if you are not doing well or get worse. Document Released: 07/03/2006 Document Revised: 05/15/2011 Document Reviewed: 09/03/2006 Exit[employer] Patient Information 2014 [employer], [place].   ________________________________________________________________________  WHAT IS A BLOOD TRANSFUSION? Blood Transfusion Information  A transfusion is the replacement of blood or some of its parts. Blood is made up of multiple cells which provide different functions.  Red blood cells carry oxygen and are used for blood loss replacement.  White blood cells fight against infection.  Platelets control bleeding.  Plasma helps clot blood.  Other blood products are available for specialized needs, such as hemophilia or other clotting disorders. BEFORE THE TRANSFUSION  Who gives blood for transfusions?   Healthy volunteers who are fully evaluated to make sure their blood is safe. This is blood bank blood. Transfusion therapy is the safest it has ever been in the practice of medicine. Before blood is taken from a donor, a complete history is taken to make sure that person has no history of diseases nor engages in risky social behavior (examples are intravenous drug use or sexual activity with multiple partners). The donor's travel history is screened to minimize risk of transmitting infections, such as malaria. The donated blood is tested for signs of infectious diseases, such as HIV and hepatitis. The blood is then tested to be sure it  is compatible with you in order to minimize the chance of a transfusion reaction. If you or a relative donates blood, this is often done  in anticipation of surgery and is not appropriate for emergency situations. It takes many days to process the donated blood. RISKS AND COMPLICATIONS Although transfusion therapy is very safe and saves many lives, the main dangers of transfusion include:   Getting an infectious disease.  Developing a transfusion reaction. This is an allergic reaction to something in the blood you were given. Every precaution is taken to prevent this. The decision to have a blood transfusion has been considered carefully by your caregiver before blood is given. Blood is not given unless the benefits outweigh the risks. AFTER THE TRANSFUSION  Right after receiving a blood transfusion, you will usually feel much better and more energetic. This is especially true if your red blood cells have gotten low (anemic). The transfusion raises the level of the red blood cells which carry oxygen, and this usually causes an energy increase.  The nurse administering the transfusion will monitor you carefully for complications. HOME CARE INSTRUCTIONS  No special instructions are needed after a transfusion. You may find your energy is better. Speak with your caregiver about any limitations on activity for underlying diseases you may have. SEEK MEDICAL CARE IF:   Your condition is not improving after your transfusion.  You develop redness or irritation at the intravenous (IV) site. SEEK IMMEDIATE MEDICAL CARE IF:  Any of the following symptoms occur over the next 12 hours:  Shaking chills.  You have a temperature by mouth above 102 F (38.9 C), not controlled by medicine.  Chest, back, or muscle pain.  People around you feel you are not acting correctly or are confused.  Shortness of breath or difficulty breathing.  Dizziness and fainting.  You get a rash or develop hives.  You  have a decrease in urine output.  Your urine turns a dark color or changes to pink, red, or brown. Any of the following symptoms occur over the next 10 days:  You have a temperature by mouth above 102 F (38.9 C), not controlled by medicine.  Shortness of breath.  Weakness after normal activity.  The white part of the eye turns yellow (jaundice).  You have a decrease in the amount of urine or are urinating less often.  Your urine turns a dark color or changes to pink, red, or brown. Document Released: 02/18/2000 Document Revised: 05/15/2011 Document Reviewed: 10/07/2007 [facility] Patient Information 2014 [place], [place].  _______________________________________________________________________

## 2017-11-16 ENCOUNTER — Encounter (HOSPITAL_COMMUNITY)
Admission: RE | Admit: 2017-11-16 | Discharge: 2017-11-16 | Disposition: A | Discharge status: Home / Self Care | Payer: PRIVATE HEALTH INSURANCE | Source: Ambulatory Visit | Attending: Obstetrics | Admitting: Obstetrics

## 2017-11-16 ENCOUNTER — Other Ambulatory Visit: Payer: Self-pay

## 2017-11-16 ENCOUNTER — Encounter (HOSPITAL_COMMUNITY): Payer: Self-pay

## 2017-11-16 DIAGNOSIS — N736 Female pelvic peritoneal adhesions (postinfective): Secondary | ICD-10-CM | POA: Insufficient documentation

## 2017-11-16 DIAGNOSIS — D4959 Neoplasm of unspecified behavior of other genitourinary organ: Secondary | ICD-10-CM | POA: Insufficient documentation

## 2017-11-16 DIAGNOSIS — Z01812 Encounter for preprocedural laboratory examination: Secondary | ICD-10-CM | POA: Diagnosis present

## 2017-11-16 HISTORY — DX: Adverse effect of unspecified anesthetic, initial encounter: T41.45XA

## 2017-11-16 HISTORY — DX: Other complications of anesthesia, initial encounter: T88.59XA

## 2017-11-16 LAB — CBC
HCT: 43.6 % (ref 36.0–46.0)
Hemoglobin: 14.6 g/dL (ref 12.0–15.0)
MCH: 30.5 pg (ref 26.0–34.0)
MCHC: 33.5 g/dL (ref 30.0–36.0)
MCV: 91 fL (ref 78.0–100.0)
Platelets: 93 10*3/uL — ABNORMAL LOW (ref 150–400)
RBC: 4.79 MIL/uL (ref 3.87–5.11)
RDW: 12.6 % (ref 11.5–15.5)
WBC: 7.7 10*3/uL (ref 4.0–10.5)

## 2017-11-16 LAB — COMPREHENSIVE METABOLIC PANEL
ALT: 30 U/L (ref 0–44)
AST: 28 U/L (ref 15–41)
Albumin: 4.5 g/dL (ref 3.5–5.0)
Alkaline Phosphatase: 50 U/L (ref 38–126)
Anion gap: 9 (ref 5–15)
BILIRUBIN TOTAL: 0.5 mg/dL (ref 0.3–1.2)
BUN: 8 mg/dL (ref 6–20)
CO2: 27 mmol/L (ref 22–32)
CREATININE: 0.68 mg/dL (ref 0.44–1.00)
Calcium: 10.9 mg/dL — ABNORMAL HIGH (ref 8.9–10.3)
Chloride: 106 mmol/L (ref 98–111)
Glucose, Bld: 112 mg/dL — ABNORMAL HIGH (ref 70–99)
POTASSIUM: 4.8 mmol/L (ref 3.5–5.1)
Sodium: 142 mmol/L (ref 135–145)
TOTAL PROTEIN: 8.1 g/dL (ref 6.5–8.1)

## 2017-11-16 LAB — URINALYSIS, ROUTINE W REFLEX MICROSCOPIC
Bilirubin Urine: NEGATIVE
GLUCOSE, UA: NEGATIVE mg/dL
HGB URINE DIPSTICK: NEGATIVE
Ketones, ur: NEGATIVE mg/dL
Leukocytes, UA: NEGATIVE
Nitrite: NEGATIVE
Protein, ur: NEGATIVE mg/dL
SPECIFIC GRAVITY, URINE: 1.009 (ref 1.005–1.030)
pH: 6 (ref 5.0–8.0)

## 2017-11-16 LAB — ABO/RH: ABO/RH(D): A POS

## 2017-11-16 NOTE — Progress Notes (Signed)
Routed cbc results to [facility] cross np and left message with michelle at [facility] cross office.

## 2017-11-20 NOTE — Anesthesia Preprocedure Evaluation (Addendum)
Anesthesia Evaluation  Patient identified by MRN, date of birth, ID band Patient awake    Reviewed: Allergy & Precautions, NPO status , Patient's Chart, lab work & pertinent test results  History of Anesthesia Complications Negative for: history of anesthetic complications  Airway Mallampati: III  TM Distance: >3 FB Neck ROM: Full    Dental  (+) Dental Advisory Given, Teeth Intact   Pulmonary Current Smoker,    breath sounds clear to auscultation       Cardiovascular Exercise Tolerance: Good negative cardio ROS   Rhythm:Regular Rate:Normal     Neuro/Psych  Headaches, Seizures -, Well Controlled,  PSYCHIATRIC DISORDERS Anxiety Depression  Right eye blindness     GI/Hepatic Neg liver ROS, GERD  Controlled,  Endo/Other   Obesity   Renal/GU negative Renal ROS  negative genitourinary   Musculoskeletal negative musculoskeletal ROS (+)   Abdominal   Peds  Hematology negative hematology ROS (+)   Anesthesia Other Findings   Reproductive/Obstetrics  S/p b/l salpingectomy, hysterectomy                              Anesthesia Physical Anesthesia Plan  ASA: II  Anesthesia Plan: General   Post-op Pain Management:    Induction: Intravenous  PONV Risk Score and Plan: 4 or greater and Treatment may vary due to age or medical condition, Ondansetron, Scopolamine patch - Pre-op, Midazolam and Dexamethasone  Airway Management Planned: Oral ETT  Additional Equipment: None  Intra-op Plan:   Post-operative Plan: Extubation in OR  Informed Consent: I have reviewed the patients History and Physical, chart, labs and discussed the procedure including the risks, benefits and alternatives for the proposed anesthesia with the patient or authorized representative who has indicated his/her understanding and acceptance.   Dental advisory given  Plan Discussed with: CRNA and  Anesthesiologist  Anesthesia Plan Comments:        Anesthesia Quick Evaluation

## 2017-11-21 ENCOUNTER — Encounter (HOSPITAL_COMMUNITY): Payer: Self-pay

## 2017-11-21 ENCOUNTER — Ambulatory Visit (HOSPITAL_COMMUNITY)
Admission: RE | Admit: 2017-11-21 | Discharge: 2017-11-21 | Disposition: A | Discharge status: Home / Self Care | Payer: PRIVATE HEALTH INSURANCE | Source: Ambulatory Visit | Attending: Obstetrics | Admitting: Obstetrics

## 2017-11-21 ENCOUNTER — Ambulatory Visit (HOSPITAL_COMMUNITY): Payer: PRIVATE HEALTH INSURANCE | Admitting: Anesthesiology

## 2017-11-21 ENCOUNTER — Encounter (HOSPITAL_COMMUNITY)
Admission: RE | Disposition: A | Discharge status: Home / Self Care | Payer: Self-pay | Source: Ambulatory Visit | Attending: Obstetrics

## 2017-11-21 DIAGNOSIS — Z90721 Acquired absence of ovaries, unilateral: Secondary | ICD-10-CM | POA: Insufficient documentation

## 2017-11-21 DIAGNOSIS — K219 Gastro-esophageal reflux disease without esophagitis: Secondary | ICD-10-CM | POA: Diagnosis not present

## 2017-11-21 DIAGNOSIS — F329 Major depressive disorder, single episode, unspecified: Secondary | ICD-10-CM | POA: Diagnosis not present

## 2017-11-21 DIAGNOSIS — Z79899 Other long term (current) drug therapy: Secondary | ICD-10-CM | POA: Insufficient documentation

## 2017-11-21 DIAGNOSIS — F419 Anxiety disorder, unspecified: Secondary | ICD-10-CM | POA: Insufficient documentation

## 2017-11-21 DIAGNOSIS — H5440 Blindness, one eye, unspecified eye: Secondary | ICD-10-CM | POA: Diagnosis not present

## 2017-11-21 DIAGNOSIS — Z6833 Body mass index (BMI) 33.0-33.9, adult: Secondary | ICD-10-CM | POA: Insufficient documentation

## 2017-11-21 DIAGNOSIS — E669 Obesity, unspecified: Secondary | ICD-10-CM | POA: Insufficient documentation

## 2017-11-21 DIAGNOSIS — G44009 Cluster headache syndrome, unspecified, not intractable: Secondary | ICD-10-CM | POA: Insufficient documentation

## 2017-11-21 DIAGNOSIS — R569 Unspecified convulsions: Secondary | ICD-10-CM | POA: Insufficient documentation

## 2017-11-21 DIAGNOSIS — N8312 Corpus luteum cyst of left ovary: Secondary | ICD-10-CM | POA: Diagnosis not present

## 2017-11-21 DIAGNOSIS — Z8543 Personal history of malignant neoplasm of ovary: Secondary | ICD-10-CM | POA: Insufficient documentation

## 2017-11-21 DIAGNOSIS — F17200 Nicotine dependence, unspecified, uncomplicated: Secondary | ICD-10-CM | POA: Diagnosis not present

## 2017-11-21 DIAGNOSIS — Z90710 Acquired absence of both cervix and uterus: Secondary | ICD-10-CM | POA: Diagnosis not present

## 2017-11-21 DIAGNOSIS — D696 Thrombocytopenia, unspecified: Secondary | ICD-10-CM | POA: Diagnosis not present

## 2017-11-21 DIAGNOSIS — D3910 Neoplasm of uncertain behavior of unspecified ovary: Secondary | ICD-10-CM

## 2017-11-21 DIAGNOSIS — R1031 Right lower quadrant pain: Secondary | ICD-10-CM | POA: Diagnosis present

## 2017-11-21 HISTORY — PX: OMENTECTOMY: SHX5985

## 2017-11-21 HISTORY — PX: ROBOTIC ASSISTED SALPINGO OOPHERECTOMY: SHX6082

## 2017-11-21 LAB — TYPE AND SCREEN
ABO/RH(D): A POS
ANTIBODY SCREEN: NEGATIVE

## 2017-11-21 SURGERY — SALPINGO-OOPHORECTOMY, ROBOT-ASSISTED
Anesthesia: General

## 2017-11-21 MED ORDER — ONDANSETRON HCL 4 MG/2ML IJ SOLN
INTRAMUSCULAR | Status: AC
  Filled 2017-11-21: qty 2

## 2017-11-21 MED ORDER — PROMETHAZINE HCL 25 MG/ML IJ SOLN: 6.2500 mg | INTRAMUSCULAR | Status: DC | PRN

## 2017-11-21 MED ORDER — DEXAMETHASONE SODIUM PHOSPHATE 10 MG/ML IJ SOLN
INTRAMUSCULAR | Status: DC | PRN
  Administered 2017-11-21: 10 mg via INTRAVENOUS

## 2017-11-21 MED ORDER — ARTIFICIAL TEARS OPHTHALMIC OINT
TOPICAL_OINTMENT | OPHTHALMIC | Status: AC
  Filled 2017-11-21: qty 3.5

## 2017-11-21 MED ORDER — DEXAMETHASONE SODIUM PHOSPHATE 4 MG/ML IJ SOLN: 4.0000 mg | INTRAMUSCULAR | Status: DC

## 2017-11-21 MED ORDER — SUGAMMADEX SODIUM 200 MG/2ML IV SOLN
INTRAVENOUS | Status: DC | PRN
  Administered 2017-11-21: 160 mg via INTRAVENOUS

## 2017-11-21 MED ORDER — ACETAMINOPHEN 500 MG PO TABS
1000.0000 mg | ORAL_TABLET | Freq: Four times a day (QID) | ORAL | Status: DC
  Administered 2017-11-21: 1000 mg via ORAL

## 2017-11-21 MED ORDER — FENTANYL CITRATE (PF) 250 MCG/5ML IJ SOLN
INTRAMUSCULAR | Status: AC
  Filled 2017-11-21: qty 5

## 2017-11-21 MED ORDER — CELECOXIB 200 MG PO CAPS
400.0000 mg | ORAL_CAPSULE | ORAL | Status: AC
  Administered 2017-11-21: 400 mg via ORAL
  Filled 2017-11-21: qty 2

## 2017-11-21 MED ORDER — GABAPENTIN 300 MG PO CAPS
300.0000 mg | ORAL_CAPSULE | ORAL | Status: AC
  Administered 2017-11-21: 300 mg via ORAL
  Filled 2017-11-21: qty 1

## 2017-11-21 MED ORDER — SUGAMMADEX SODIUM 200 MG/2ML IV SOLN
INTRAVENOUS | Status: AC
  Filled 2017-11-21: qty 2

## 2017-11-21 MED ORDER — MIDAZOLAM HCL 2 MG/2ML IJ SOLN
INTRAMUSCULAR | Status: AC
  Filled 2017-11-21: qty 2

## 2017-11-21 MED ORDER — KETOROLAC TROMETHAMINE 30 MG/ML IJ SOLN
INTRAMUSCULAR | Status: AC
  Filled 2017-11-21: qty 1

## 2017-11-21 MED ORDER — LIDOCAINE 2% (20 MG/ML) 5 ML SYRINGE
INTRAMUSCULAR | Status: AC
  Filled 2017-11-21: qty 5

## 2017-11-21 MED ORDER — ONDANSETRON HCL 4 MG PO TABS
4.0000 mg | ORAL_TABLET | Freq: Four times a day (QID) | ORAL | Status: DC | PRN
  Filled 2017-11-21: qty 1

## 2017-11-21 MED ORDER — OXYCODONE HCL 5 MG PO TABS: 5.0000 mg | ORAL_TABLET | Freq: Once | ORAL | Status: DC | PRN

## 2017-11-21 MED ORDER — ROCURONIUM BROMIDE 10 MG/ML (PF) SYRINGE
PREFILLED_SYRINGE | INTRAVENOUS | Status: AC
  Filled 2017-11-21: qty 10

## 2017-11-21 MED ORDER — NALOXONE HCL 0.4 MG/ML IJ SOLN
0.4000 mg | INTRAMUSCULAR | Status: DC | PRN
  Administered 2017-11-21: 0.04 mg via INTRAVENOUS

## 2017-11-21 MED ORDER — KETOROLAC TROMETHAMINE 30 MG/ML IJ SOLN
INTRAMUSCULAR | Status: DC | PRN
  Administered 2017-11-21: 30 mg via INTRAVENOUS

## 2017-11-21 MED ORDER — KETOROLAC TROMETHAMINE 30 MG/ML IJ SOLN: 30.0000 mg | Freq: Four times a day (QID) | INTRAMUSCULAR | Status: DC

## 2017-11-21 MED ORDER — MIDAZOLAM HCL 2 MG/2ML IJ SOLN
INTRAMUSCULAR | Status: DC | PRN
  Administered 2017-11-21: 2 mg via INTRAVENOUS

## 2017-11-21 MED ORDER — ONDANSETRON HCL 4 MG/2ML IJ SOLN: 4.0000 mg | Freq: Four times a day (QID) | INTRAMUSCULAR | Status: DC | PRN

## 2017-11-21 MED ORDER — CEFAZOLIN SODIUM-DEXTROSE 2-4 GM/100ML-% IV SOLN
2.0000 g | INTRAVENOUS | Status: AC
  Administered 2017-11-21: 2 g via INTRAVENOUS
  Filled 2017-11-21: qty 100

## 2017-11-21 MED ORDER — KETAMINE HCL 10 MG/ML IJ SOLN
INTRAMUSCULAR | Status: AC
  Filled 2017-11-21: qty 1

## 2017-11-21 MED ORDER — BUPIVACAINE-EPINEPHRINE (PF) 0.25% -1:200000 IJ SOLN
INTRAMUSCULAR | Status: DC | PRN
  Administered 2017-11-21: 30 mL

## 2017-11-21 MED ORDER — ACETAMINOPHEN 500 MG PO TABS
1000.0000 mg | ORAL_TABLET | ORAL | Status: AC
  Administered 2017-11-21: 1000 mg via ORAL
  Filled 2017-11-21: qty 2

## 2017-11-21 MED ORDER — PROPOFOL 10 MG/ML IV BOLUS
INTRAVENOUS | Status: DC | PRN
  Administered 2017-11-21: 180 mg via INTRAVENOUS

## 2017-11-21 MED ORDER — DEXAMETHASONE SODIUM PHOSPHATE 10 MG/ML IJ SOLN
INTRAMUSCULAR | Status: AC
  Filled 2017-11-21: qty 1

## 2017-11-21 MED ORDER — KETAMINE HCL 10 MG/ML IJ SOLN
INTRAMUSCULAR | Status: DC | PRN
  Administered 2017-11-21: 40 mg via INTRAVENOUS

## 2017-11-21 MED ORDER — BISACODYL 5 MG PO TBEC: 5.0000 mg | DELAYED_RELEASE_TABLET | Freq: Every day | ORAL | 0 refills | Status: DC | PRN

## 2017-11-21 MED ORDER — FENTANYL CITRATE (PF) 100 MCG/2ML IJ SOLN
INTRAMUSCULAR | Status: DC | PRN
  Administered 2017-11-21: 50 ug via INTRAVENOUS
  Administered 2017-11-21 (×6): 25 ug via INTRAVENOUS
  Administered 2017-11-21: 50 ug via INTRAVENOUS

## 2017-11-21 MED ORDER — ACETAMINOPHEN 500 MG PO TABS
ORAL_TABLET | ORAL | Status: AC
  Administered 2017-11-21: 1000 mg via ORAL
  Filled 2017-11-21: qty 2

## 2017-11-21 MED ORDER — SODIUM CHLORIDE 0.9 % IR SOLN
Status: DC | PRN
  Administered 2017-11-21: 3000 mL

## 2017-11-21 MED ORDER — OXYCODONE HCL 5 MG PO TABS: 5.0000 mg | ORAL_TABLET | ORAL | 0 refills | Status: DC | PRN

## 2017-11-21 MED ORDER — LIDOCAINE 2% (20 MG/ML) 5 ML SYRINGE
INTRAMUSCULAR | Status: DC | PRN
  Administered 2017-11-21: 1.5 mg/kg/h via INTRAVENOUS

## 2017-11-21 MED ORDER — ONDANSETRON 4 MG PO TBDP
ORAL_TABLET | ORAL | Status: AC
  Filled 2017-11-21: qty 1

## 2017-11-21 MED ORDER — OXYCODONE HCL 5 MG PO TABS: 5.0000 mg | ORAL_TABLET | ORAL | Status: DC | PRN

## 2017-11-21 MED ORDER — SCOPOLAMINE 1 MG/3DAYS TD PT72
1.0000 | MEDICATED_PATCH | TRANSDERMAL | Status: DC
  Administered 2017-11-21: 1.5 mg via TRANSDERMAL
  Filled 2017-11-21: qty 1

## 2017-11-21 MED ORDER — NALOXONE HCL 0.4 MG/ML IJ SOLN
INTRAMUSCULAR | Status: AC
  Administered 2017-11-21: 0.04 mg via INTRAVENOUS
  Filled 2017-11-21: qty 1

## 2017-11-21 MED ORDER — FENTANYL CITRATE (PF) 100 MCG/2ML IJ SOLN: 25.0000 ug | INTRAMUSCULAR | Status: DC | PRN

## 2017-11-21 MED ORDER — LIDOCAINE 2% (20 MG/ML) 5 ML SYRINGE
INTRAMUSCULAR | Status: DC | PRN
  Administered 2017-11-21: 60 mg via INTRAVENOUS

## 2017-11-21 MED ORDER — ONDANSETRON HCL 4 MG/2ML IJ SOLN
INTRAMUSCULAR | Status: DC | PRN
  Administered 2017-11-21: 4 mg via INTRAVENOUS

## 2017-11-21 MED ORDER — LACTATED RINGERS IV SOLN
INTRAVENOUS | Status: DC
  Administered 2017-11-21: 08:00:00 via INTRAVENOUS

## 2017-11-21 MED ORDER — PROPOFOL 10 MG/ML IV BOLUS
INTRAVENOUS | Status: AC
  Filled 2017-11-21: qty 20

## 2017-11-21 MED ORDER — ROCURONIUM BROMIDE 10 MG/ML (PF) SYRINGE
PREFILLED_SYRINGE | INTRAVENOUS | Status: DC | PRN
  Administered 2017-11-21: 60 mg via INTRAVENOUS
  Administered 2017-11-21 (×2): 10 mg via INTRAVENOUS

## 2017-11-21 MED ORDER — BUPIVACAINE HCL (PF) 0.25 % IJ SOLN
INTRAMUSCULAR | Status: AC
  Filled 2017-11-21: qty 30

## 2017-11-21 MED ORDER — ONDANSETRON 4 MG PO TBDP
4.0000 mg | ORAL_TABLET | Freq: Once | ORAL | Status: AC
  Administered 2017-11-21: 4 mg via ORAL

## 2017-11-21 MED ORDER — OXYCODONE HCL 5 MG/5ML PO SOLN
5.0000 mg | Freq: Once | ORAL | Status: DC | PRN
  Filled 2017-11-21: qty 5

## 2017-11-21 SURGICAL SUPPLY — 72 items
AGENT HMST KT MTR STRL THRMB (HEMOSTASIS)
APL ESCP 34 STRL LF DISP (HEMOSTASIS)
APL SKNCLS STERI-STRIP NONHPOA (GAUZE/BANDAGES/DRESSINGS) ×2
APL SWBSTK 6 STRL LF DISP (MISCELLANEOUS) ×2
APPLICATOR COTTON TIP 6 STRL (MISCELLANEOUS) ×2 IMPLANT
APPLICATOR COTTON TIP 6IN STRL (MISCELLANEOUS) ×3
APPLICATOR SURGIFLO ENDO (HEMOSTASIS) IMPLANT
BAG LAPAROSCOPIC 12 15 PORT 16 (BASKET) IMPLANT
BAG RETRIEVAL 12/15 (BASKET)
BAG SPEC RTRVL LRG 6X4 10 (ENDOMECHANICALS) ×2
BENZOIN TINCTURE PRP APPL 2/3 (GAUZE/BANDAGES/DRESSINGS) ×3 IMPLANT
COVER BACK TABLE 60X90IN (DRAPES) ×3 IMPLANT
COVER TIP SHEARS 8 DVNC (MISCELLANEOUS) ×2 IMPLANT
COVER TIP SHEARS 8MM DA VINCI (MISCELLANEOUS) ×2
DEFOGGER SCOPE WARMER CLEARIFY (MISCELLANEOUS) ×1 IMPLANT
DRAPE ARM DVNC X/XI (DISPOSABLE) ×8 IMPLANT
DRAPE COLUMN DVNC XI (DISPOSABLE) ×2 IMPLANT
DRAPE DA VINCI XI ARM (DISPOSABLE) ×4
DRAPE DA VINCI XI COLUMN (DISPOSABLE) ×1
DRAPE SHEET LG 3/4 BI-LAMINATE (DRAPES) ×3 IMPLANT
DRAPE SURG IRRIG POUCH 19X23 (DRAPES) ×3 IMPLANT
DRAPE UNDERBUTTOCKS STRL (DRAPE) ×3 IMPLANT
ELECT REM PT RETURN 15FT ADLT (MISCELLANEOUS) ×3 IMPLANT
GLOVE BIO SURGEON STRL SZ 6 (GLOVE) IMPLANT
GLOVE BIO SURGEON STRL SZ 6.5 (GLOVE) IMPLANT
GLOVE BIOGEL PI IND STRL 7.0 (GLOVE) ×6 IMPLANT
GLOVE BIOGEL PI INDICATOR 7.0 (GLOVE) ×3
GLOVE SURG SS PI 6.5 STRL IVOR (GLOVE) ×9 IMPLANT
GOWN STRL REUS W/ TWL LRG LVL3 (GOWN DISPOSABLE) ×2 IMPLANT
GOWN STRL REUS W/TWL LRG LVL3 (GOWN DISPOSABLE) ×3
GOWN STRL REUS W/TWL XL LVL3 (GOWN DISPOSABLE) ×6 IMPLANT
GYRUS RUMI II 2.5CM BLUE (DISPOSABLE)
GYRUS RUMI II 3.5CM BLUE (DISPOSABLE)
HOLDER FOLEY CATH W/STRAP (MISCELLANEOUS) ×3 IMPLANT
IRRIG SUCT STRYKERFLOW 2 WTIP (MISCELLANEOUS) ×3
IRRIGATION SUCT STRKRFLW 2 WTP (MISCELLANEOUS) ×2 IMPLANT
KIT PROCEDURE DA VINCI SI (MISCELLANEOUS)
KIT PROCEDURE DVNC SI (MISCELLANEOUS) IMPLANT
MANIPULATOR UTERINE 4.5 ZUMI (MISCELLANEOUS) IMPLANT
NDL SPNL 18GX3.5 QUINCKE PK (NEEDLE) IMPLANT
NEEDLE SPNL 18GX3.5 QUINCKE PK (NEEDLE) IMPLANT
OBTURATOR OPTICAL STANDARD 8MM (TROCAR) ×1
OBTURATOR OPTICAL STND 8 DVNC (TROCAR) ×2
OBTURATOR OPTICALSTD 8 DVNC (TROCAR) ×2 IMPLANT
PACK ROBOT GYN CUSTOM WL (TRAY / TRAY PROCEDURE) ×3 IMPLANT
PAD POSITIONING PINK XL (MISCELLANEOUS) ×3 IMPLANT
POUCH SPECIMEN RETRIEVAL 10MM (ENDOMECHANICALS) ×1 IMPLANT
RUMI II 3.0CM BLUE KOH-EFFICIE (DISPOSABLE) IMPLANT
RUMI II GYRUS 2.5CM BLUE (DISPOSABLE) IMPLANT
RUMI II GYRUS 3.5CM BLUE (DISPOSABLE) IMPLANT
SEAL CANN UNIV 5-8 DVNC XI (MISCELLANEOUS) ×8 IMPLANT
SEAL XI 5MM-8MM UNIVERSAL (MISCELLANEOUS) ×4
SET TRI-LUMEN FLTR TB AIRSEAL (TUBING) ×3 IMPLANT
STRIP CLOSURE SKIN 1/2X4 (GAUZE/BANDAGES/DRESSINGS) ×3 IMPLANT
SURGIFLO W/THROMBIN 8M KIT (HEMOSTASIS) IMPLANT
SUT VIC AB 0 CT1 27 (SUTURE)
SUT VIC AB 0 CT1 27XBRD ANTBC (SUTURE) IMPLANT
SUT VIC AB 4-0 P2 18 (SUTURE) ×6 IMPLANT
SUT VLOC 180 0 9IN  GS21 (SUTURE) ×2
SUT VLOC 180 0 9IN GS21 (SUTURE) ×4 IMPLANT
SYR 10ML LL (SYRINGE) IMPLANT
TIP RUMI ORANGE 6.7MMX12CM (TIP) IMPLANT
TIP UTERINE 5.1X6CM LAV DISP (MISCELLANEOUS) IMPLANT
TIP UTERINE 6.7X10CM GRN DISP (MISCELLANEOUS) IMPLANT
TIP UTERINE 6.7X6CM WHT DISP (MISCELLANEOUS) IMPLANT
TIP UTERINE 6.7X8CM BLUE DISP (MISCELLANEOUS) IMPLANT
TOWEL OR NON WOVEN STRL DISP B (DISPOSABLE) ×3 IMPLANT
TRAP SPECIMEN MUCOUS 40CC (MISCELLANEOUS) IMPLANT
TRAY FOLEY MTR SLVR 16FR STAT (SET/KITS/TRAYS/PACK) ×3 IMPLANT
TROCAR PORT AIRSEAL 12X150 (TUBING) ×1 IMPLANT
UNDERPAD 30X30 (UNDERPADS AND DIAPERS) ×3 IMPLANT
WATER STERILE IRR 1000ML POUR (IV SOLUTION) ×3 IMPLANT

## 2017-11-21 NOTE — Transfer of Care (Signed)
Immediate Anesthesia Transfer of Care Note  Patient: Angel Rodriguez  Procedure(s) Performed: Procedure(s) (LRB): XI ROBOTIC ASSISTED LEFT SALPINGO OOPHORECTOMY (Left) OMENTECTOMY AND PELVIC WASHINGS (N/A)  Patient Location: PACU  Anesthesia Type: General  Level of Consciousness: awake, oriented, sedated and patient cooperative  Airway & Oxygen Therapy: Patient Spontanous Breathing and Patient connected to face mask oxygen  Post-op Assessment: Report given to PACU RN and Post -op Vital signs reviewed and stable  Post vital signs: Reviewed and stable  Complications: No apparent anesthesia complications  Last Vitals:  Vitals Value Taken Time  BP 132/108 11/21/2017 10:33 AM  Temp    Pulse 66 11/21/2017 10:39 AM  Resp 26 11/21/2017 10:39 AM  SpO2 92 % 11/21/2017 10:39 AM  Vitals shown include unvalidated device data.  Last Pain:  Vitals:   11/21/17 0747  TempSrc:   PainSc: 0-No pain

## 2017-11-21 NOTE — Anesthesia Procedure Notes (Signed)
Procedure Name: Intubation Date/Time: 11/21/2017 8:38 AM Performed by: Suan Halter, CRNA Pre-anesthesia Checklist: Patient identified, Emergency Drugs available, Suction available and Patient being monitored Patient Re-evaluated:Patient Re-evaluated prior to induction Oxygen Delivery Method: Circle system utilized Preoxygenation: Pre-oxygenation with 100% oxygen Induction Type: IV induction Ventilation: Mask ventilation without difficulty Laryngoscope Size: Mac and 3 Grade View: Grade I Tube type: Oral Tube size: 7.0 mm Number of attempts: 1 Airway Equipment and Method: Stylet and Oral airway Placement Confirmation: ETT inserted through vocal cords under direct vision,  positive ETCO2 and breath sounds checked- equal and bilateral Tube secured with: Tape Dental Injury: Teeth and Oropharynx as per pre-operative assessment

## 2017-11-21 NOTE — Interval H&P Note (Signed)
History and Physical Interval Note:  11/21/2017 7:46 AM  Angel Rodriguez  has presented today for surgery, with the diagnosis of [place]  The various methods of treatment have been discussed with the patient and family. After consideration of risks, benefits and other options for treatment, the patient has consented to  Procedure(s): XI ROBOTIC ASSISTED UNILATERAL SALPINGO OOPHORECTOMY (Left) OMENTECTOMY AND PELVIC WASHINGS (N/A) as a surgical intervention .  The patient's history has been reviewed, patient examined, no change in status, stable for surgery.  I have reviewed the patient's chart and labs.  Questions were answered to the patient's satisfaction.   Patient aware and consents to possible exploration  Isabel Caprice

## 2017-11-21 NOTE — Anesthesia Postprocedure Evaluation (Signed)
Anesthesia Post Note  Patient: Angel Rodriguez  Procedure(s) Performed: XI ROBOTIC ASSISTED LEFT SALPINGO OOPHORECTOMY (Left ) OMENTECTOMY AND PELVIC WASHINGS (N/A )     Patient location during evaluation: PACU Anesthesia Type: General Level of consciousness: awake and alert Pain management: pain level controlled Vital Signs Assessment: post-procedure vital signs reviewed and stable Respiratory status: spontaneous breathing, nonlabored ventilation, respiratory function stable and patient connected to nasal cannula oxygen Cardiovascular status: blood pressure returned to baseline and stable Postop Assessment: no apparent nausea or vomiting Anesthetic complications: no    Last Vitals:  Vitals:   11/21/17 1243 11/21/17 1245  BP: 140/88 140/88  Pulse: 74 77  Resp: 12 15  Temp: (!) 36 C   SpO2: 96% 98%                  Audry Pili

## 2017-11-21 NOTE — Op Note (Signed)
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   Anesthesia: GETA  Specimens: Left ovary (and tube if any residual microscopic). Pelvic washings. Pertioneal biopsy. Omentum.  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  Operative Findings: normal appearing left ovary. No gross disease.  Procedure in Detail:  The patient was taken to the operating room and placed under general endotracheal anesthesia without difficulty. She was placed in a dorsolithotomy position with arms tucked and cervical acromial pads were placed. The patient had sequential compression devices for VTE prophylaxis.  The patient was then prepped in the usual sterile fashion.  Time out was performed.   A foley and sponge on a stick were placed by me.  A 63mm incision was made in the left upper quadrant palmer's point and a 5 mm Optiview trocar used to enter the abdomen under direct visualization. With entry into the abdomen and then maintenance of 15 mm of mercury the patient was placed in Trendelenburg position. An incision was made superior to the umbilicus and used for an 76mm trocar. To the right of this 2 additional 50mm trocars were placed  approximately 6-8 cm from the closest trocar. An 79mm trocar was also placed in the left lateral abdominal wall. 8 mm robotic trochars were inserted. The 62mm LUQ trocar was changed out to 59mm airseal. Pelvic washings were obtained. The abdomen was inspected as was the pelvis.  Findings as noted and images taken. The robot was docked.  An incision was made on the left pelvic side wall peritoneum parallel to the IP ligament and the retroperitoneal space entered. The left ureter was identified and the para-rectal space was developed. A window was created in the broad ligament above the ureter. The infundibulopelvic vessels were skeletonized cauterized and transected. The remaining broad ligament was taken down with cautery dissection until the adnexa was free. This adnexa was placed in an Endo catch bag.   A random biopsy of the right pelvic sidewall peritoneum was performed.  The omentum was grasped midline and brought down to the anterior pelvis. A portion of the infracolic omentum was isolated into separate pedicles and each pedical coagulated and transected being sure the transverse colon was away from the field of dissection. The remaining omentum was noted to be hemostatic. The omentum was placed into a 10 mm Endocatch bag.   The operative sites were inspected and hemostasis was noted. Irrigation was used.   The robot was undocked. The bag holding the ovary and omentum were removed through the 10 mm LUQ trocar site. The bag holding the left ovary was brought to the same trocar site and the bag opened. The ovary was removed, with controlled morcellation in the bag, to facilitate removal. The omentum was also removed using its bag in a similar manner.   The ports were all removed. The fascial closure at the left upper quadrant port was made with  0 Vicryl.  All incisions were closed with interrupted 4-0 vicryl and running subcuticular 4-0 Monocryl suture. Dermabond was applied.  Sponge, lap and needle  counts were correct x 2          Disposition: PACU         Condition: Stable

## 2017-11-22 ENCOUNTER — Encounter (HOSPITAL_COMMUNITY): Payer: Self-pay | Admitting: Obstetrics

## 2017-11-25 ENCOUNTER — Emergency Department (HOSPITAL_COMMUNITY)
Admission: EM | Admit: 2017-11-25 | Discharge: 2017-11-25 | Discharge status: Absent without leave | Payer: PRIVATE HEALTH INSURANCE

## 2017-11-26 ENCOUNTER — Encounter: Payer: Self-pay | Admitting: Gynecologic Oncology

## 2017-11-27 ENCOUNTER — Other Ambulatory Visit: Payer: Self-pay | Admitting: Neurology

## 2017-11-28 ENCOUNTER — Telehealth: Payer: Self-pay

## 2017-11-28 ENCOUNTER — Inpatient Hospital Stay: Payer: PRIVATE HEALTH INSURANCE

## 2017-11-28 ENCOUNTER — Telehealth: Payer: Self-pay | Admitting: *Deleted

## 2017-11-28 DIAGNOSIS — R300 Dysuria: Secondary | ICD-10-CM

## 2017-11-28 LAB — URINALYSIS, COMPLETE (UACMP) WITH MICROSCOPIC
BILIRUBIN URINE: NEGATIVE
Glucose, UA: NEGATIVE mg/dL
Ketones, ur: NEGATIVE mg/dL
NITRITE: NEGATIVE
PH: 6 (ref 5.0–8.0)
Protein, ur: NEGATIVE mg/dL
SPECIFIC GRAVITY, URINE: 1.008 (ref 1.005–1.030)

## 2017-11-28 MED ORDER — ACETAMINOPHEN-CODEINE #3 300-30 MG PO TABS: ORAL_TABLET | ORAL | 0 refills | Status: DC

## 2017-11-28 NOTE — Telephone Encounter (Signed)
Returned pt's call regarding "spotting and pain with urination"- no answer, left VM for her to call office back.  Per [name] NP - obtain more information from patient about her symptoms above and she can come in to give a urine sample for u/a and culture.

## 2017-11-28 NOTE — Telephone Encounter (Signed)
Spoke with Angel Rodriguez. She req. r/f of Tyl. #3. [place] registry shows she has gotten several recent rx's for Oxycodone from another physician, for pain related to a surgical procedure. Per [facility], she can only get controlled substances from one physician. I explained this to [name] in an extended, pleasant conversation. We discussed the dangers of taking multiple controlled substances.  She verbalized understanding of same. Dr. Felecia Shelling will continue rx'ing Tyl. #3, Clonazepam, with pt's verbal and written agreement that she will not obtain any controlled substances from any other physician. Pt. agreeable, will sign pain contract when she picks up rx. today/fim

## 2017-11-28 NOTE — Progress Notes (Signed)
[place] at [facility]   Progress Note: Established Patient Follow-Up Visit   Consult was originally requested by Dr. 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 Ovarian cancer on right [facility])    HPI: [name]  is a very nice 42 y.o.  P3  . Interval History Since her last visit she I took her to surgery 11/21/17 and performed pelvic washings, RA LSC left oophorectomy, Infracolic Omentectomy, Peritoneal biopsy. Findings noted normal appearing left ovary. No gross disease. Final pathology: 1. Peritoneum, biopsy, right pelvic sidewall - BENIGN PERITONEUM. - NO BORDERLINE TUMOR OR MALIGNANCY. 2. Omentum, resection other than for tumor - BENIGN OMENTUM. - NO BORDERLINE TUMOR OR MALIGNANCY. 3. Ovary and fallopian tube, left LEFT OVARY: - BENIGN CORPUS LUTEUM. - NO BORDERLINE TUMOR OR MALIGNANCY. Washings were negative.  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  She is seen today for her early postop check and pathology review. She had some spotting with urination since surgery. Urine was checked. Low level E faecalis and Macrobid helped.    . Presenting History (Oncologic Course if applicable) She noted menstrual-like bleeding (or more) starting August 2018. She had previously had endometrial ablation years prior. In January 2019 the bleeding worsened and she had a hemoglobin down to 7 noting she had to wear "diapers".   She has been seen by Heme-Onc for thrombocytopenia with platelets down into the 70/80's for the last couple of years even when there was no associated anemia.  Gyn followup revealed fibroids and discussion was had for surgery following Lupron. She then underwent TLH/Bilateral salpingectomy 07/31/17. A few weeks postop she noted signficant RLQ pain and nausea/vomiting. Workup with CTScan  and ultrasound revealed a large adnexal mass (8.8 x 7.5 x 7.4 cm. There are multiple cysts measuring up to 3.8 x 3.5 x 2.5 cm. The ovary is markedly enlarged with increased echogenicity consistent with edema) and suspected torsion so she was taken for an urgent right oophorectomy 09/19/17. It is of note the right adnexa was grossly normal at the time of her prior surgery however after the torsion/hemorrhage/necrosis the right adnexa was found to be enlarged up to 8cm as noted above. No evidence of metastatic disease on the CT done 09/19/17.  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 Surface involvement cannot be determined due to (controlled) capsule rupture in an endocatch bag to deliver through the laparoscopic incisions.  In addition no other staging or washings was performed (given the normal prior appearance of the ovary).  I had her presented at [facility] and had the pathology reviewed by [facility]. 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 Final recommendations agree on contralateral ovary removal, omentectomy, and washings and if negative observation.   Imported EPIC Oncologic History:   No history exists.    Measurement of disease:  . Nothing done preoperatively . We'll follow CA125 in followup  Radiology: CT 09/2017 preop No results found.    Outpatient Encounter Medications as of 12/07/2017  Medication Sig  . acetaminophen-codeine (TYLENOL #3) 300-30 MG tablet TAKE 1 TO 2 TABS BY MOUTH EVERY 4 HOURS AS NEEDED FOR MOD PAIN  OR SEVERE PAIN (FOR POST OP PAIN)  . atropine 1 % ophthalmic solution Place 1 drop into the right eye 3 (three) times daily.   . bisacodyl (DULCOLAX) 5 MG EC tablet Take 1 tablet (5 mg total) by mouth  daily as needed for moderate constipation.  . brimonidine (ALPHAGAN P) 0.1 % SOLN Place 1 drop into the right eye 3 (three) times daily.   . calcium carbonate (TUMS EX) 750 MG chewable tablet Chew 2 tablets by mouth as needed for heartburn.  . clonazePAM (KLONOPIN) 0.5 MG tablet Take 1 tablet (0.5 mg total) by mouth 2 (two) times daily as needed. for anxiety  . Cyanocobalamin (VITAMIN B-12 PO) Take 1 tablet by mouth daily.   . dorzolamide-timolol (COSOPT) 22.3-6.8 MG/ML ophthalmic solution Place 1 drop into the right eye 2 (two) times daily.   [name] FLUoxetine (PROZAC) 20 MG capsule Take 1 capsule (20 mg total) by mouth daily. (Patient taking differently: Take 20 mg by mouth daily as needed (anxiety). )  . fluticasone (FLONASE) 50 MCG/ACT nasal spray Place 1-2 sprays into both nostrils daily as needed for allergies.   [name] latanoprost (XALATAN) 0.005 % ophthalmic solution Place 1 drop into both eyes at bedtime.  . Multiple Vitamin (MULTI-VITAMINS) TABS Take 1 tablet by mouth daily.   . SUMAtriptan (IMITREX) 100 MG tablet Take 1 tablet (100 mg total) by mouth once as needed for migraine. May repeat in 2 hours if headache persists or recurs.  . verapamil (CALAN-SR) 240 MG CR tablet Take 1 tablet (240 mg total) by mouth at bedtime. (Patient taking differently: Take 240 mg by mouth at bedtime. For migraines)  . Vitamin D, Ergocalciferol, (DRISDOL) 50000 units CAPS capsule Take 1 capsule (50,000 Units total) by mouth every 7 (seven) days.  . [DISCONTINUED] oxyCODONE (OXY IR/ROXICODONE) 5 MG immediate release tablet Take 1 tablet (5 mg total) by mouth every 4 (four) hours as needed for severe pain.   Facility-Administered Encounter Medications as of 12/07/2017  Medication  . gadopentetate dimeglumine (MAGNEVIST) injection 16 mL   Allergies  Allergen Reactions  . Ivp Dye [Iodinated Diagnostic Agents] Hives, Itching and Swelling  . Bee Venom Swelling    Past Medical History:  Diagnosis Date  . Anxiety   .  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 Cluster headaches    started after seizure and migraines  . Complication of anesthesia    trouble with short term memory after surgeries  . Depression   . Fibroids    s/p TLH, bilateral salpingectomy  . GERD (gastroesophageal reflux disease)    patient thinks due to medications  . History of anemia    prior to hysterectomy  . History of pneumonia   . History of trichomoniasis   . Seizures ([place]) x 1 2-3 yrs ago none since   once saw neurologist, full body brain MRI and another 6 months lster  . Status post right oophorectomy   . Thrombocytopenia ([place])    sees [place] with Heme Onc   Past Surgical History:  Procedure Laterality Date  . CYSTOSCOPY N/A 07/31/2017   Procedure: CYSTOSCOPY;  Surgeon: Megan Salon, MD;  Location: [facility];  Service: Gynecology;  Laterality: N/A;  . ENDOMETRIAL ABLATION    . EYE SURGERY     laser - removed blood from right eye  . HYSTEROSCOPY W/D&C    . LAPAROSCOPY N/A 09/19/2017   Procedure: LAPAROSCOPY DIAGNOSTIC WITH RIGHT OOPHERECTOMY, LYSIS OF ADHESIONS;  Surgeon: Sumner Boast  Estill Bamberg, MD;  Location: [facility];  Service: Gynecology;  Laterality: N/A;  . OMENTECTOMY N/A 11/21/2017   Procedure: OMENTECTOMY AND PELVIC WASHINGS;  Surgeon: Isabel Caprice, MD;  Location: [facility];  Service: Gynecology;  Laterality: N/A;  . ROBOTIC ASSISTED SALPINGO OOPHERECTOMY Left 11/21/2017   Procedure: XI ROBOTIC ASSISTED LEFT SALPINGO OOPHORECTOMY;  Surgeon: Isabel Caprice, MD;  Location: [facility];  Service: Gynecology;  Laterality: Left;  . TOTAL LAPAROSCOPIC HYSTERECTOMY WITH SALPINGECTOMY Bilateral 07/31/2017   Procedure: TOTAL LAPAROSCOPIC HYSTERECTOMY WITH SALPINGECTOMY;  Surgeon: Megan Salon, MD;  Location: [facility];  Service: Gynecology;  Laterality: Bilateral;  20 week size uterus/ Alexis bag in room/ need 4.5 hours  . TUBAL LIGATION    . WISDOM TOOTH EXTRACTION          Past Gynecological History:   Patient's last  menstrual period was 05/02/2017. . Menarche: 42 years old . P 3 . Contraceptive H/o OCP . HRT none  . Last Pap 03/2017 negative with neg HRHPV Family Hx:  Family History  Problem Relation Age of Onset  . Alcoholism Father   . Cirrhosis Father   . Cancer Maternal Grandmother        unsure of type; died of other causes  . Heart disease Maternal Aunt    Social Hx:  [name] Tobacco use: current some days and vap . Alcohol use: 10/month . Illicit Drug use: none . Illicit IV Drug use: none    Review of Systems: Review of Systems - Oncology  Vitals: Blood pressure (!) 103/55, pulse 64, temperature 98.7 F (37.1 C), temperature source Oral, resp. rate 20, height 5' (1.524 m), weight 173 lb 11.2 oz (78.8 kg), last menstrual period 05/02/2017, SpO2 100 %. Vitals:   12/07/17 1106  Weight: 170 lb 8 oz (77.3 kg)  Height: 5' (1.524 m)    Body mass index is 33.3 kg/m.   Physical Exam: General :  Well developed, 42 y.o., female in no apparent distress HEENT:  Normocephalic/atraumatic, symmetric, EOMI, eyelids normal Neck:   Supple, no masses.  Lymphatics:  No cervical/ submandibular/ supraclavicular/ infraclavicular/ inguinal adenopathy Respiratory:  Respirations unlabored, no use of accessory muscles CV:   Deferred Breast:  Deferred Musculoskeletal: No CVA tenderness, normal muscle strength. Abdomen:  Soft, non-tender and nondistended. No evidence of hernia. No masses. Wounds CDI Extremities:  No lymphedema, no erythema, non-tender. Skin:   Normal inspection Neuro/Psych:  No focal motor deficit, no abnormal mental status. Normal gait. Normal affect. 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 Micropapillary variant o This increases the risk for invasive implants, but is not an indication by itself for chemotherapy 2. Blood in  pelvis at surgery 09/19/17 o This does not indicate absolutely there was rupture and I would not upstage her based on this o I will plan to get washings at our surgery 3. Surveillance  o we'll follow CA125, although it was not checked preop due to lack of suspicion of malignancy o RTC 3 months for pelvic then go to Q 6 months and annual with Gyn. 4. Genetics typically not recommended in setting of LMP   Isabel Caprice, MD  12/07/2017, 11:41 AM    Cc: Sumner Boast, MD (Referring Ob/Gyn) none (PCP)

## 2017-11-28 NOTE — Telephone Encounter (Signed)
Incoming call from patient - she reported for past 2 days, having reddish spotting when wiped, scant amt on underwear, and noticed more when voided.  Reports as itchy,vaginal area, reported painful urination in earlier VM - when asked about whether it's painful, she said she has never had a UTI other than when pregnant, was not sure how to describe how it feels, reports maybe more like irritated when voiding. Notified Dr Gerarda Fraction and [name] NP- orders for lab only to collect urine for u/a and culture today.  Pt voiced understanding and appt for 1 pm.

## 2017-11-28 NOTE — Telephone Encounter (Signed)
Outgoing call per [name] NP regarding pt's U/A results, "results may be contaminated and will be best if she wants to wait for the culture results, most likely back on this Friday, can try vaseline or monistat external on any irritated areas vaginally"  - pt voiced understanding.  Pt reports she will wait for culture results on Friday and try vaseline or monistat if needed. Encouraged pt to call us for any worsening of symptoms but otherwise we will call her this Friday with culture results.  Pt voiced understanding and no other needs per pt at this time.

## 2017-11-28 NOTE — Telephone Encounter (Signed)
Pt. presented to the office, signed pain contract; is aware she must use one pharmacy for all controlled substances, and would like to use [facility] on [address], phone# [phone], fax# [phone]./fim

## 2017-11-30 ENCOUNTER — Telehealth: Payer: Self-pay

## 2017-11-30 DIAGNOSIS — R300 Dysuria: Secondary | ICD-10-CM

## 2017-11-30 MED ORDER — NITROFURANTOIN MONOHYD MACRO 100 MG PO CAPS: 100.0000 mg | ORAL_CAPSULE | Freq: Two times a day (BID) | ORAL | 0 refills | Status: AC

## 2017-11-30 NOTE — Telephone Encounter (Signed)
Outgoing call to patient regarding urine culture results and to see how are her symptoms per [name] NP - no answer, left VM to call our office back.

## 2017-11-30 NOTE — Telephone Encounter (Signed)
Per [name] NP- asked pt how she is feeling, pt reports she is still having burning when she urinates.  Reports spotting earlier in week when voided but none today.  Explained to pt to continue monastat/ vaseline - apply in area where feels burning, try pouring water or spray bottle of water at same time as when she is voiding, and start antibiotic (Macrobid 100mg  twice a day x 5 days, no refills) this afternoon.  Continue these instructions over weekend and I let her know we will call her on Monday to see if symptoms improving. I told her if symptoms worsening ie pain, back pain, fever- can be seen at urgent care.  Pt voiced understanding and no other needs at this time.

## 2017-12-01 LAB — URINE CULTURE

## 2017-12-03 ENCOUNTER — Telehealth: Payer: Self-pay

## 2017-12-03 NOTE — Telephone Encounter (Signed)
Incoming call from patient.  She reports she is feeling much better and the Macrobid has helped- no more burning or other symptoms she was having.  Encouraged to complete antibiotics.  Pt voiced understanding, encouraged to call fro any additional concerns and no other needs per pt at this time.  Notified Melissa NP.

## 2017-12-03 NOTE — Telephone Encounter (Signed)
Outgoing call to patient to see if Macrobid is helping.  No answer, left her VM to call our office back.

## 2017-12-07 ENCOUNTER — Inpatient Hospital Stay: Payer: PRIVATE HEALTH INSURANCE | Attending: Hematology | Admitting: Obstetrics

## 2017-12-07 ENCOUNTER — Encounter: Payer: Self-pay | Admitting: Obstetrics

## 2017-12-07 VITALS — BP 106/64 | HR 66 | Temp 98.2°F | Resp 18 | Ht 60.0 in | Wt 170.5 lb

## 2017-12-07 DIAGNOSIS — Z90710 Acquired absence of both cervix and uterus: Secondary | ICD-10-CM

## 2017-12-07 DIAGNOSIS — C561 Malignant neoplasm of right ovary: Secondary | ICD-10-CM

## 2017-12-07 DIAGNOSIS — Z90722 Acquired absence of ovaries, bilateral: Secondary | ICD-10-CM

## 2017-12-07 NOTE — Patient Instructions (Addendum)
1. Return in 3 months for pelvic and CA125 2. Work restrictions no heavy lifting pulling pushing until 6 weeks from our surgery  [facility] AT [phone] IN November OR December 2019 TO SCHEDULE YOUR APPOINTMENT IN January 2020 WITH DR. Tnya Ades.

## 2017-12-12 ENCOUNTER — Telehealth: Payer: Self-pay | Admitting: Obstetrics

## 2017-12-12 NOTE — Telephone Encounter (Signed)
Faxed medical records to [facility]: Georgina Snell @ [phone]. Release ID #[id number]

## 2017-12-16 ENCOUNTER — Other Ambulatory Visit: Payer: Self-pay | Admitting: Obstetrics & Gynecology

## 2017-12-22 ENCOUNTER — Other Ambulatory Visit: Payer: Self-pay | Admitting: Obstetrics & Gynecology

## 2017-12-24 NOTE — Telephone Encounter (Signed)
Medication refill request: Vit D  Last AEX:  03/29/17 DL Next AEX: 12/17/18 SM Last MMG (if hormonal medication request): 01/31/16 BIRADS2:Benign  Refill authorized: 10/03/17 #12caps/0R. Has lab appt for Vit D recheck 01/04/18

## 2018-01-04 ENCOUNTER — Other Ambulatory Visit: Payer: Self-pay | Admitting: Obstetrics & Gynecology

## 2018-01-04 ENCOUNTER — Other Ambulatory Visit: Payer: PRIVATE HEALTH INSURANCE

## 2018-01-04 DIAGNOSIS — E559 Vitamin D deficiency, unspecified: Secondary | ICD-10-CM

## 2018-01-05 LAB — PTH, INTACT AND CALCIUM
Calcium: 11 mg/dL — ABNORMAL HIGH (ref 8.7–10.2)
PTH: 13 pg/mL — AB (ref 15–65)

## 2018-01-05 LAB — VITAMIN D 25 HYDROXY (VIT D DEFICIENCY, FRACTURES): Vit D, 25-Hydroxy: 24.5 ng/mL — ABNORMAL LOW (ref 30.0–100.0)

## 2018-01-07 ENCOUNTER — Telehealth: Payer: Self-pay | Admitting: *Deleted

## 2018-01-07 NOTE — Telephone Encounter (Signed)
Patient returned call

## 2018-01-07 NOTE — Telephone Encounter (Signed)
LM for pt to call back.

## 2018-01-07 NOTE — Telephone Encounter (Signed)
-----   Message from Megan Salon, MD sent at 01/07/2018 12:59 PM EST ----- Please let pt know her Vit D is better at 24.  She needs to keep taking 1000 IU OTC Vit D.  Her calcium is still elevated but PTH is low.  This calcium level needs further evaluation and I need her to follow up with her PCP.  Thanks.

## 2018-01-08 NOTE — Telephone Encounter (Signed)
LM for pt to call back. Please route to Triage if I am not available.  

## 2018-01-11 NOTE — Telephone Encounter (Signed)
LM for pt to call back.

## 2018-01-15 ENCOUNTER — Ambulatory Visit (INDEPENDENT_AMBULATORY_CARE_PROVIDER_SITE_OTHER): Payer: PRIVATE HEALTH INSURANCE | Admitting: Obstetrics & Gynecology

## 2018-01-15 ENCOUNTER — Encounter: Payer: Self-pay | Admitting: Obstetrics & Gynecology

## 2018-01-15 VITALS — BP 108/70 | HR 84 | Resp 16 | Ht 60.0 in | Wt 179.4 lb

## 2018-01-15 DIAGNOSIS — D3910 Neoplasm of uncertain behavior of unspecified ovary: Secondary | ICD-10-CM

## 2018-01-15 DIAGNOSIS — N951 Menopausal and female climacteric states: Secondary | ICD-10-CM | POA: Diagnosis not present

## 2018-01-15 DIAGNOSIS — Z8659 Personal history of other mental and behavioral disorders: Secondary | ICD-10-CM | POA: Diagnosis not present

## 2018-01-15 DIAGNOSIS — E7800 Pure hypercholesterolemia, unspecified: Secondary | ICD-10-CM | POA: Diagnosis not present

## 2018-01-15 NOTE — Progress Notes (Signed)
GYNECOLOGY  VISIT  CC:   Discuss multiple issues since surgery  HPI: 42 y.o. [id number] Single Other or two or more races female here for discussion of several issues.  Reports having recent biometric screening at work with very elevated cholesterol.  Does not have copy of this with her today.  Can email this to me.  Worried about results.  Reports she was not fasting for this test.  Reports blood sugar was ok.  Would like to start weight loss medication.  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  Do not feel any stimulant would be a good idea.  Also, does use Tylenol #3 for headaches from time to time.  She has a pain medication contract with neurology.  This limits options as well.  Could consider seeing Dr. Leafy Ro but pt just does not feel she can miss work at this time.  Underwent TLH/bilateral salpingectomy/cystoscopy 07/31/17.  Pt then had an ovarian torsion of the right ovary 09/19/17.  Final pathology showed serous borderline ovarian tumor and lesion was disrupted at the time of surgery.  (Ovary did appear normal at the time of hysterectomy).  Then, she was referred to gyn oncology for surgical staging including left oophorectomy, omentectomy and peritoneal biopsy.  Final pathology staged her at a A! Serous borderline tumor with micropapillary variant.  She is currently being followed by oncology every three months.  No additional treatment has been recommended.    Is having hot flashes but feels she are manageable.  Is frustrated with body weight.  Feels she has gained weight (although she is basically the same weight today as when I first saw her).  She may be having body shape changes due to her surgical menopause.  Just doesn't feel great.  Work is a stressors.  D/w pt just how much she's been through this year.  She is grateful that everything has turned out ok thus far but she is scared about the future and is tearful today.  D/w pt  possible therapy.  She feels this would really be a good idea and would like referral for this.   Does need PCP as well.  Lives in [place].    GYNECOLOGIC HISTORY: Patient's last menstrual period was 05/02/2017. Contraception: hysterectomy Menopausal hormone therapy: none  Patient Active Problem List   Diagnosis Date Noted  . 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 Abnormal MRI of head 01/21/2016  . Anemia 01/12/2016  . Vascular headache 09/15/2015  . Insomnia 09/15/2015  . Headache 07/08/2015  . Headache, chronic migraine without aura, intractable 07/08/2015  . Anxiety state 07/08/2015  . Neck pain 06/24/2015  . Convulsions ([place]) 06/24/2015  . Central retinal vein occlusion 12/11/2011  . Cotton wool exudates 12/11/2011  . Cystoid macular edema 12/11/2011    Past Medical History:  Diagnosis Date  . Anxiety   . 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 Cluster headaches    started after seizure and migraines  . Complication of anesthesia    trouble with short term memory after surgeries  . Depression   . Fibroids    s/p TLH, bilateral salpingectomy  . GERD (gastroesophageal reflux disease)    patient thinks due to medications  . History of anemia    prior to hysterectomy  . History of pneumonia   . History of trichomoniasis   . Seizures ([place]) x 1 2-3 yrs ago none since  once saw neurologist, full body brain MRI and another 6 months lster  . Status post right oophorectomy   . Thrombocytopenia ([place])    sees [place] with Heme Onc    Past Surgical History:  Procedure Laterality Date  . CYSTOSCOPY N/A 07/31/2017   Procedure: CYSTOSCOPY;  Surgeon: Megan Salon, MD;  Location: [facility];  Service: Gynecology;  Laterality: N/A;  . ENDOMETRIAL ABLATION    . EYE SURGERY     laser - removed blood from right eye  . HYSTEROSCOPY W/D&C    . LAPAROSCOPY N/A 09/19/2017   Procedure: LAPAROSCOPY DIAGNOSTIC WITH RIGHT OOPHERECTOMY, LYSIS OF ADHESIONS;   Surgeon: Salvadore Dom, MD;  Location: [facility];  Service: Gynecology;  Laterality: N/A;  . OMENTECTOMY N/A 11/21/2017   Procedure: OMENTECTOMY AND PELVIC WASHINGS;  Surgeon: Isabel Caprice, MD;  Location: [facility];  Service: Gynecology;  Laterality: N/A;  . ROBOTIC ASSISTED SALPINGO OOPHERECTOMY Left 11/21/2017   Procedure: XI ROBOTIC ASSISTED LEFT SALPINGO OOPHORECTOMY;  Surgeon: Isabel Caprice, MD;  Location: [facility];  Service: Gynecology;  Laterality: Left;  . TOTAL LAPAROSCOPIC HYSTERECTOMY WITH SALPINGECTOMY Bilateral 07/31/2017   Procedure: TOTAL LAPAROSCOPIC HYSTERECTOMY WITH SALPINGECTOMY;  Surgeon: Megan Salon, MD;  Location: [facility];  Service: Gynecology;  Laterality: Bilateral;  20 week size uterus/ Alexis bag in room/ need 4.5 hours  . TUBAL LIGATION    . WISDOM TOOTH EXTRACTION      MEDS:   Current Outpatient Medications on File Prior to Visit  Medication Sig Dispense Refill  . acetaminophen-codeine (TYLENOL #3) 300-30 MG tablet TAKE 1 TO 2 TABS BY MOUTH EVERY 4 HOURS AS NEEDED FOR MOD PAIN OR SEVERE PAIN (FOR POST OP PAIN) 30 tablet 0  . atropine 1 % ophthalmic solution Place 1 drop into the right eye 3 (three) times daily.     . bisacodyl (DULCOLAX) 5 MG EC tablet Take 1 tablet (5 mg total) by mouth daily as needed for moderate constipation. 30 tablet 0  . brimonidine (ALPHAGAN P) 0.1 % SOLN Place 1 drop into the right eye 3 (three) times daily.     . calcium carbonate (TUMS EX) 750 MG chewable tablet Chew 2 tablets by mouth as needed for heartburn.    . clonazePAM (KLONOPIN) 0.5 MG tablet Take 1 tablet (0.5 mg total) by mouth 2 (two) times daily as needed. for anxiety 180 tablet 1  . Cyanocobalamin (VITAMIN B-12 PO) Take 1 tablet by mouth daily.     . dorzolamide-timolol (COSOPT) 22.3-6.8 MG/ML ophthalmic solution Place 1 drop into the right eye 2 (two) times daily.     [name] FLUoxetine (PROZAC) 20 MG capsule Take 1 capsule (20 mg total) by mouth daily. (Patient taking  differently: Take 20 mg by mouth daily as needed (anxiety). ) 90 capsule 2  . fluticasone (FLONASE) 50 MCG/ACT nasal spray Place 1-2 sprays into both nostrils daily as needed for allergies.     [name] latanoprost (XALATAN) 0.005 % ophthalmic solution Place 1 drop into both eyes at bedtime.    . Multiple Vitamin (MULTI-VITAMINS) TABS Take 1 tablet by mouth daily.     . SUMAtriptan (IMITREX) 100 MG tablet Take 1 tablet (100 mg total) by mouth once as needed for migraine. May repeat in 2 hours if headache persists or recurs. 18 tablet 3  . verapamil (CALAN-SR) 240 MG CR tablet Take 1 tablet (240 mg total) by mouth at bedtime. (Patient taking differently: Take 240 mg by mouth at bedtime. For migraines)  90 tablet 3   Current Facility-Administered Medications on File Prior to Visit  Medication Dose Route Frequency Provider Last Rate Last Dose  . gadopentetate dimeglumine (MAGNEVIST) injection 16 mL  16 mL Intravenous Once PRN Sater, Nanine Means, MD        ALLERGIES: Ivp dye [iodinated diagnostic agents] and Bee venom  Family History  Problem Relation Age of Onset  . Alcoholism Father   . Cirrhosis Father   . Cancer Maternal Grandmother        unsure of type; died of other causes  . Heart disease Maternal Aunt     SH:  Single, occ smoker  Review of Systems  Constitutional: Negative.   Respiratory: Negative.   Cardiovascular: Negative.   Gastrointestinal: Negative.   Genitourinary: Negative.     PHYSICAL EXAMINATION:    BP 108/70 (BP Location: Right Arm, Patient Position: Sitting, Cuff Size: Large)   Pulse 84   Resp 16   Ht 5' (1.524 m)   Wt 179 lb 6.4 oz (81.4 kg)   LMP 05/02/2017   BMI 35.04 kg/m     General appearance: alert, cooperative and appears stated age No physical exam performed today  Chaperone was present for exam.  Assessment: Three surgeries this year due to menorrhagia, signiicnatly enlarged uterus, h/o anemia, then ovarian torsion of ovary that contained serous  borderline tumor Hot flashes Elevated cholesterol Thrombocytopenia  Plan: Pt is going to get me copy of lab work.  Will then refer to PCP--Dr. Annia Belt recommendations about management Weight loss medication not recommended at this time. Will get pt information about therapy and refer if needed.   ~25 minutes spent with patient >50% of time was in face to face discussion of above.

## 2018-01-15 NOTE — Telephone Encounter (Signed)
Results given in person today.

## 2018-02-04 ENCOUNTER — Telehealth: Payer: Self-pay | Admitting: *Deleted

## 2018-02-04 NOTE — Telephone Encounter (Signed)
Called pt since she was on cx list. Scheduled appt tomorrow at 830am with Dr. Felecia Shelling for appt. Advised her to check in 8am. She verbalized understanding and appreciation.  She has ha's/requesting sphenocath.

## 2018-02-05 ENCOUNTER — Ambulatory Visit (INDEPENDENT_AMBULATORY_CARE_PROVIDER_SITE_OTHER): Payer: PRIVATE HEALTH INSURANCE | Admitting: Neurology

## 2018-02-05 ENCOUNTER — Encounter: Payer: Self-pay | Admitting: Neurology

## 2018-02-05 VITALS — BP 101/67 | HR 62 | Ht 60.0 in | Wt 177.5 lb

## 2018-02-05 DIAGNOSIS — G441 Vascular headache, not elsewhere classified: Secondary | ICD-10-CM | POA: Diagnosis not present

## 2018-02-05 DIAGNOSIS — R569 Unspecified convulsions: Secondary | ICD-10-CM

## 2018-02-05 DIAGNOSIS — M542 Cervicalgia: Secondary | ICD-10-CM | POA: Diagnosis not present

## 2018-02-05 DIAGNOSIS — G43711 Chronic migraine without aura, intractable, with status migrainosus: Secondary | ICD-10-CM | POA: Diagnosis not present

## 2018-02-05 DIAGNOSIS — H348112 Central retinal vein occlusion, right eye, stable: Secondary | ICD-10-CM | POA: Diagnosis not present

## 2018-02-05 NOTE — Progress Notes (Signed)
[facility]  PATIENT: Angel Rodriguez DOB: 04-Aug-1975  REFERRING DOCTOR OR PCP:   SOURCE: patient, ED records, images on PACS, reports in EMR  _________________________________   HISTORICAL  CHIEF COMPLAINT:  Chief Complaint  Patient presents with  . Follow-up    RM 12, alone. Here to f/u on headaches. States headaches are worse. Stress, weather exacerbates sx.    HISTORY OF PRESENT ILLNESS:  Angel Rodriguez is a 42 y.o. woman with frequent headaches and h/o seizure.  Update 02/05/2018: She reports that headaches are back. They occur daily with migrainous features of nausea and photophobia/phonophobia.   They returned over a month ago.    She also notes that the right eye becomes red.     She feels stress worsens the headaches.     Movements worsen them.    She is taking Imitrex with benefit but headaches return.       She is on verapamil and that seemed to help her some for a while.     Headaches occur 30/30 of the last days and for > 4 hours/day.       Last sphenocath procedure was April 2019 and it knocked headaches out completely x 1 month and then they returned mildly.     She has a h/o central retinal vein occlusion and is on drops ([facility])  She had a single seizure in March 2016 and is not on an anticonvulsant.    Insomnia is doing well on her medications.     Update 10/25/2017: Her headaches are doing better since the sphenocath procedure and the trigger point injections.    She still has some headache pain but it is not severe.    Pain sometimes builds up some as the day goes on, especially at work.     She denies any numbness, weakness or gait change.  She sees [facility] for her h/o central retinal vein occlusion.  She is still on eye drops.      She only had one seizure 3 years ago and is not on any anticonvulsant.       She has some insomnia.    She has trouble falling asleep and staying asleep.   She changed her clonazepma to 0.25 qAM and  0.75 mg qPM.  She has ovarian cancer and had surgery.   She had ovarian torsion but pathology showed possible cancer so she is going to have another operation.      Update 06/27/2017: HA's worsened again last week.    The conjunctivitis returned the next day.   All the pain is on her right side only.     In the past sphenopalatine ganglion blocks have helped the most.    She also has received some benefit form ONB/TPIs in the past.    She denies any additional seizures.      Insomnia was doing better and was helped by clonazepam.  However, she is doing worse since the headache has recurred.  Clonazepam also helps her anxiety.   Update 04/20/2017: Headaches are doing fairly well.   She is now having 3-4 HA's a month.    Verapamil has helped reduce the frequency of headaches.     When needed, Imitrex often helps.     She will sometimes try Tylenol first.   Due to low platelets, she can't take NSAIDs.     The TCPenia was felt to be due to fibroids and these are being treated.  She still gets redness in the right eye, not always associated with HA.    In the past when her headaches were more severe, she had the Sphenocath procedure and it was very beneficial.    Occipital nerve blocks or TPI's have also been beneficial in the past.    She has not had any more seizure.   She just had one in 2016   From 07/11/2016: HA:   Her headaches were doing much better but they are have been occurring more the past month or two. Additionally she continues to have conjunctival redness on the right.     They greatly improved after starting Verapamil and having occipital nerve block/splenius capitis muscle injection at the first visit and then [place] at the second, third and fourth visits.  After the second Sphenocath, the conjunctival redness got much better but then returned. After the fourth procedure, she went nearly pain-free for 6 months until milder headaches returned and activities slowly worsening. Pain  improves  with Imitrex and rest much of the time.  She no longer can take NSAID    She tolerates the verapamil well but has occasional constipation .     Studies:   MRI of the brain was essentially normal with just a single small T2/FLAIR hyperintense focus in the left frontal subcortical white matter. The MR venogram was normal. ESR and ANA were normal.    Mood:  Anxiety is much better.   Clonazepam with Buspar has helped better than the escitalopram.    Seizure:   In March 2016, she had a seizure.   She had an aura of numbness in her right hand and then passed out.   She is not sure there was any generalized tonic-clonic activity. Her children did not tell her that she had any. However, she did have urinary incontinence. She does not remember much that day.    Since then, she has not had any more seizures.  She has not needed any AED as she had only the one seizure/spell.       Insomnia:   She is sleeping better since starting clonazepam and having much less headache  Thrombocytopenia:   Her platelet count was reduced to 78,000 and she saw her doctor Irene Limbo in hematology. Repeat platelet count was 83,000. She was advised to stop anti-inflammatories.  REVIEW OF SYSTEMS: Constitutional: No fevers, chills, sweats, or change in appetite.  She has insomnia. Eyes: as above Ear, nose and throat: No hearing loss, ear pain, nasal congestion, sore throat Cardiovascular: No chest pain, palpitations Respiratory: No shortness of breath at rest or with exertion.   No wheezes GastrointestinaI: No nausea, vomiting, diarrhea, abdominal pain, fecal incontinence Genitourinary: No dysuria, urinary retention or frequency.  No nocturia.   Possible ovarian cancer Musculoskeletal: No neck pain, back pain Integumentary: No rash, pruritus, skin lesions Neurological: as above Psychiatric: No depression at this time.  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 Allergic/Immunologic: No itchy/runny eyes, nasal congestion, recent allergic reactions, rashes  ALLERGIES: Allergies  Allergen Reactions  . Ivp Dye [Iodinated Diagnostic Agents] Hives, Itching and Swelling  . Bee Venom Swelling    HOME MEDICATIONS:  Current Outpatient Medications:  .  acetaminophen-codeine (TYLENOL #3) 300-30 MG tablet, TAKE 1 TO 2 TABS BY MOUTH EVERY 4 HOURS AS NEEDED FOR MOD PAIN OR SEVERE  PAIN (FOR POST OP PAIN), Disp: 30 tablet, Rfl: 0 .  atropine 1 % ophthalmic solution, Place 1 drop into the right eye 3 (three) times daily. , Disp: , Rfl:  .  bisacodyl (DULCOLAX) 5 MG EC tablet, Take 1 tablet (5 mg total) by mouth daily as needed for moderate constipation., Disp: 30 tablet, Rfl: 0 .  brimonidine (ALPHAGAN P) 0.1 % SOLN, Place 1 drop into the right eye 3 (three) times daily. , Disp: , Rfl:  .  calcium carbonate (TUMS EX) 750 MG chewable tablet, Chew 2 tablets by mouth as needed for heartburn., Disp: , Rfl:  .  clonazePAM (KLONOPIN) 0.5 MG tablet, Take 1 tablet (0.5 mg total) by mouth 2 (two) times daily as needed. for anxiety, Disp: 180 tablet, Rfl: 1 .  Cyanocobalamin (VITAMIN B-12 PO), Take 1 tablet by mouth daily. , Disp: , Rfl:  .  dorzolamide-timolol (COSOPT) 22.3-6.8 MG/ML ophthalmic solution, Place 1 drop into the right eye 2 (two) times daily. , Disp: , Rfl:  .  FLUoxetine (PROZAC) 20 MG capsule, Take 1 capsule (20 mg total) by mouth daily. (Patient taking differently: Take 20 mg by mouth daily as needed (anxiety). ), Disp: 90 capsule, Rfl: 2 .  fluticasone (FLONASE) 50 MCG/ACT nasal spray, Place 1-2 sprays into both nostrils daily as needed for allergies. , Disp: , Rfl:  .  latanoprost (XALATAN) 0.005 % ophthalmic solution, Place 1 drop into both eyes at bedtime., Disp: , Rfl:  .  Multiple Vitamin  (MULTI-VITAMINS) TABS, Take 1 tablet by mouth daily. , Disp: , Rfl:  .  SUMAtriptan (IMITREX) 100 MG tablet, Take 1 tablet (100 mg total) by mouth once as needed for migraine. May repeat in 2 hours if headache persists or recurs., Disp: 18 tablet, Rfl: 3 .  verapamil (CALAN-SR) 240 MG CR tablet, Take 1 tablet (240 mg total) by mouth at bedtime. (Patient taking differently: Take 240 mg by mouth at bedtime. For migraines), Disp: 90 tablet, Rfl: 3  PAST MEDICAL HISTORY: Past Medical History:  Diagnosis Date  . Anxiety   . 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 Cluster headaches    started after seizure and migraines  . Complication of anesthesia    trouble with short term memory after surgeries  . Depression   . Fibroids    s/p TLH, bilateral salpingectomy  . GERD (gastroesophageal reflux disease)    patient thinks due to medications  . History of anemia    prior to hysterectomy  . History of pneumonia   . History of trichomoniasis   . Seizures ([place]) x 1 2-3 yrs ago none since   once saw neurologist, full body brain MRI and another 6 months lster  . Status post right oophorectomy   . Thrombocytopenia ([place])    sees [place] with [facility]    PAST SURGICAL HISTORY: Past Surgical History:  Procedure Laterality Date  . CYSTOSCOPY N/A 07/31/2017   Procedure: CYSTOSCOPY;  Surgeon: Megan Salon, MD;  Location: [facility];  Service: Gynecology;  Laterality: N/A;  . ENDOMETRIAL ABLATION    . EYE SURGERY     laser - removed blood from right eye  . HYSTEROSCOPY W/D&C    . LAPAROSCOPY N/A 09/19/2017   Procedure: LAPAROSCOPY DIAGNOSTIC WITH RIGHT OOPHERECTOMY, LYSIS OF ADHESIONS;  Surgeon: Salvadore Dom, MD;  Location: [facility];  Service: Gynecology;  Laterality: N/A;  . OMENTECTOMY N/A 11/21/2017   Procedure: OMENTECTOMY AND PELVIC WASHINGS;  Surgeon:  Isabel Caprice, MD;  Location: [facility];  Service: Gynecology;  Laterality: N/A;  . ROBOTIC ASSISTED SALPINGO OOPHERECTOMY Left 11/21/2017    Procedure: XI ROBOTIC ASSISTED LEFT SALPINGO OOPHORECTOMY;  Surgeon: Isabel Caprice, MD;  Location: [facility];  Service: Gynecology;  Laterality: Left;  . TOTAL LAPAROSCOPIC HYSTERECTOMY WITH SALPINGECTOMY Bilateral 07/31/2017   Procedure: TOTAL LAPAROSCOPIC HYSTERECTOMY WITH SALPINGECTOMY;  Surgeon: Megan Salon, MD;  Location: [facility];  Service: Gynecology;  Laterality: Bilateral;  20 week size uterus/ Alexis bag in room/ need 4.5 hours  . TUBAL LIGATION    . WISDOM TOOTH EXTRACTION      FAMILY HISTORY: Family History  Problem Relation Age of Onset  . Alcoholism Father   . Cirrhosis Father   . Cancer Maternal Grandmother        unsure of type; died of other causes  . Heart disease Maternal Aunt     SOCIAL HISTORY:  Social History   Socioeconomic History  . Marital status: Single    Spouse name: Not on file  . Number of children: Not on file  . Years of education: Not on file  . Highest education level: Not on file  Occupational History  . Not on file  Social Needs  . Financial resource strain: Not on file  . Food insecurity:    Worry: Not on file    Inability: Not on file  . Transportation needs:    Medical: Not on file    Non-medical: Not on file  Tobacco Use  . Smoking status: Current Some Day Smoker    Years: 20.00    Types: E-cigarettes, Cigarettes  . Smokeless tobacco: Never Used  . Tobacco comment: occasional cigerate and vap  Substance and Sexual Activity  . Alcohol use: Yes    Comment: occ wine  . Drug use: No  . Sexual activity: Yes    Birth control/protection: Surgical    Comment: hysterectomy  Lifestyle  . Physical activity:    Days per week: Not on file    Minutes per session: Not on file  . Stress: Not on file  Relationships  . Social connections:    Talks on phone: Not on file    Gets together: Not on file    Attends religious service: Not on file    Active member of club or organization: Not on file    Attends meetings of clubs or  organizations: Not on file    Relationship status: Not on file  . Intimate partner violence:    Fear of current or ex partner: Not on file    Emotionally abused: Not on file    Physically abused: Not on file    Forced sexual activity: Not on file  Other Topics Concern  . Not on file  Social History Narrative  . Not on file     PHYSICAL EXAM  Vitals:   02/05/18 0833  BP: 101/67  Pulse: 62  Weight: 177 lb 8 oz (80.5 kg)  Height: 5' (1.524 m)    Body mass index is 34.67 kg/m.   General: The patient is well-developed and well-nourished and in no acute distress  Eyes:   There is mild conjunctival erythema on the right..  Normal funduscopic examination.  Neck: The neck is tender over the right occiput and splenius capitis muscle.  Range of motion is fairly normal   Neurologic Exam  Mental status: The patient is alert and oriented x 3 at the time of the examination. The  patient has apparent normal recent and remote memory, with an apparently normal attention span and concentration ability.   Speech is normal.  Cranial nerves: Extraocular movements are full.  She has very mild right ptosis and facial strength is normal elsewhere.  There is normal facial sensation to soft touch.   Trapezius and sternocleidomastoid strength is normal. No dysarthria is noted.  The tongue is midline, and the patient has symmetric elevation of the soft palate. No obvious hearing deficits are noted.  Motor:  Muscle bulk is normal.   Tone is normal. Strength is  5 / 5 in all 4 extremities.   Sensory: She has normal sensation to touch and vibration in the arms and legs   Gait and station: Station is normal.   Gait and the tandem gait are normal.  Romberg is negative.    DIAGNOSTIC DATA (LABS, IMAGING, TESTING) - I reviewed patient records, labs, notes, testing and imaging myself where available.  Lab Results  Component Value Date   WBC 7.7 11/16/2017   HGB 14.6 11/16/2017   HCT 43.6 11/16/2017    MCV 91.0 11/16/2017   PLT 93 (L) 11/16/2017      Component Value Date/Time   NA 142 11/16/2017 1004   NA 138 08/16/2017 1627   NA 138 08/31/2016 0957   K 4.8 11/16/2017 1004   K 3.9 08/31/2016 0957   CL 106 11/16/2017 1004   CO2 27 11/16/2017 1004   CO2 22 08/31/2016 0957   GLUCOSE 112 (H) 11/16/2017 1004   GLUCOSE 106 08/31/2016 0957   BUN 8 11/16/2017 1004   BUN 8 08/16/2017 1627   BUN 8.6 08/31/2016 0957   CREATININE 0.68 11/16/2017 1004   CREATININE 0.8 08/31/2016 0957   CALCIUM 11.0 (H) 01/04/2018 1453   CALCIUM 10.3 08/31/2016 0957   PROT 8.1 11/16/2017 1004   PROT 7.2 08/16/2017 1627   PROT 7.6 08/31/2016 0957   ALBUMIN 4.5 11/16/2017 1004   ALBUMIN 4.6 08/16/2017 1627   ALBUMIN 3.9 08/31/2016 0957   AST 28 11/16/2017 1004   AST 18 08/31/2016 0957   ALT 30 11/16/2017 1004   ALT 23 08/31/2016 0957   ALKPHOS 50 11/16/2017 1004   ALKPHOS 62 08/31/2016 0957   BILITOT 0.5 11/16/2017 1004   BILITOT 0.3 08/16/2017 1627   BILITOT 0.43 08/31/2016 0957   GFRNONAA >60 11/16/2017 1004   GFRAA >60 11/16/2017 1004       PROCEDURE  The patient was placed in the supine position. A temperature strip was added to the cheek area after the area was cleaned with alcohol. The Sphenocath was lubricated with gel, and placed in the right naris. The catheter was inserted above the middle turbinate to the posterior nasal cavity, and then withdrawn 1 cm. The catheter was deployed and rotated approximately 20 towards the nose. 2-1/2 mL of 2% lidocaine was deployed. The patient was asked to swallow during the injection. The patient demonstrated erythema of the sclera of the eye on this side, and an increase in the cheek temperature was noted from [facility] to 34.5C.  The patient tolerated the procedure well. No complications of the procedure were noted. The patient was kept in the supine position for 2-3 minutes following the procedure. She was given small sips of water after sitting up  following the procedure.      ASSESSMENT AND PLAN  Vascular headache  Intractable chronic migraine without aura and with status migrainosus  Central retinal vein occlusion of right eye, unspecified complication  status  Neck pain  Seizure ([place])    1.   Sphenocath procedure on the right (right sphenopalatine ganglia block and right trigeminal nerve block).   Headache pain was better afterwards.  2.   Right splenius capitus and splenius cervicus trigger point injection with 0.5% Marcaine using sterile technique.    Neck pain was better afterwards and the patient tolerated the procedure well.   3.   Continue prn Imitrex.   Due to the frequency of migraines (30/30 days) and failure of prophylactic agents, she is a good candidate for Botox.   If that does not help, consider an anti-CGRP agent.   4.   Clonazepam 0.5 mg twice a day for anxiety and insomnia.   Continue fluoxetine. 3.  She will return in 6 months or sooner if there are new or worsening symptoms.  Rindy Kollman A. Felecia Shelling, MD, PhD 94/05/7003, 2:59 AM Certified in Neurology, Clinical Neurophysiology, Sleep Medicine, Pain Medicine and Neuroimaging   [facility] [address], [place], [place] [address] [phone] [phone]

## 2018-02-05 NOTE — Progress Notes (Signed)
Gave completed/signed  botox form to Angel Rodriguez. Dr. Felecia Shelling requested 200U. 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 Pt wanting to try to get botox before the end of the yr d/t insurance change for new yr. She currently has met her deductible for this year.

## 2018-03-11 ENCOUNTER — Ambulatory Visit: Payer: PRIVATE HEALTH INSURANCE | Admitting: Family Medicine

## 2018-03-13 ENCOUNTER — Emergency Department (HOSPITAL_COMMUNITY): Payer: PRIVATE HEALTH INSURANCE

## 2018-03-13 ENCOUNTER — Other Ambulatory Visit: Payer: Self-pay

## 2018-03-13 ENCOUNTER — Observation Stay (HOSPITAL_COMMUNITY)
Admission: EM | Admit: 2018-03-13 | Discharge: 2018-03-15 | Disposition: A | Discharge status: Home / Self Care | Payer: PRIVATE HEALTH INSURANCE | Attending: Internal Medicine | Admitting: Internal Medicine

## 2018-03-13 ENCOUNTER — Encounter (HOSPITAL_COMMUNITY): Payer: Self-pay | Admitting: Emergency Medicine

## 2018-03-13 DIAGNOSIS — D696 Thrombocytopenia, unspecified: Secondary | ICD-10-CM | POA: Diagnosis present

## 2018-03-13 DIAGNOSIS — Z8673 Personal history of transient ischemic attack (TIA), and cerebral infarction without residual deficits: Secondary | ICD-10-CM | POA: Insufficient documentation

## 2018-03-13 DIAGNOSIS — Z91041 Radiographic dye allergy status: Secondary | ICD-10-CM | POA: Insufficient documentation

## 2018-03-13 DIAGNOSIS — R29810 Facial weakness: Secondary | ICD-10-CM | POA: Insufficient documentation

## 2018-03-13 DIAGNOSIS — F419 Anxiety disorder, unspecified: Secondary | ICD-10-CM | POA: Diagnosis present

## 2018-03-13 DIAGNOSIS — Z5309 Procedure and treatment not carried out because of other contraindication: Secondary | ICD-10-CM | POA: Diagnosis not present

## 2018-03-13 DIAGNOSIS — Z90710 Acquired absence of both cervix and uterus: Secondary | ICD-10-CM | POA: Diagnosis not present

## 2018-03-13 DIAGNOSIS — M2578 Osteophyte, vertebrae: Secondary | ICD-10-CM | POA: Insufficient documentation

## 2018-03-13 DIAGNOSIS — F17210 Nicotine dependence, cigarettes, uncomplicated: Secondary | ICD-10-CM | POA: Insufficient documentation

## 2018-03-13 DIAGNOSIS — G43819 Other migraine, intractable, without status migrainosus: Secondary | ICD-10-CM | POA: Insufficient documentation

## 2018-03-13 DIAGNOSIS — Z79899 Other long term (current) drug therapy: Secondary | ICD-10-CM | POA: Insufficient documentation

## 2018-03-13 DIAGNOSIS — G939 Disorder of brain, unspecified: Secondary | ICD-10-CM | POA: Diagnosis present

## 2018-03-13 DIAGNOSIS — R200 Anesthesia of skin: Principal | ICD-10-CM

## 2018-03-13 DIAGNOSIS — R471 Dysarthria and anarthria: Secondary | ICD-10-CM | POA: Diagnosis present

## 2018-03-13 DIAGNOSIS — F329 Major depressive disorder, single episode, unspecified: Secondary | ICD-10-CM

## 2018-03-13 DIAGNOSIS — R9089 Other abnormal findings on diagnostic imaging of central nervous system: Secondary | ICD-10-CM | POA: Diagnosis present

## 2018-03-13 DIAGNOSIS — Z91030 Bee allergy status: Secondary | ICD-10-CM | POA: Insufficient documentation

## 2018-03-13 DIAGNOSIS — G43719 Chronic migraine without aura, intractable, without status migrainosus: Secondary | ICD-10-CM | POA: Diagnosis present

## 2018-03-13 DIAGNOSIS — K219 Gastro-esophageal reflux disease without esophagitis: Secondary | ICD-10-CM | POA: Insufficient documentation

## 2018-03-13 DIAGNOSIS — R569 Unspecified convulsions: Secondary | ICD-10-CM | POA: Diagnosis not present

## 2018-03-13 DIAGNOSIS — Z811 Family history of alcohol abuse and dependence: Secondary | ICD-10-CM | POA: Insufficient documentation

## 2018-03-13 DIAGNOSIS — Z8379 Family history of other diseases of the digestive system: Secondary | ICD-10-CM | POA: Insufficient documentation

## 2018-03-13 DIAGNOSIS — R531 Weakness: Secondary | ICD-10-CM

## 2018-03-13 DIAGNOSIS — G43711 Chronic migraine without aura, intractable, with status migrainosus: Secondary | ICD-10-CM | POA: Diagnosis present

## 2018-03-13 DIAGNOSIS — Z809 Family history of malignant neoplasm, unspecified: Secondary | ICD-10-CM | POA: Insufficient documentation

## 2018-03-13 DIAGNOSIS — F17290 Nicotine dependence, other tobacco product, uncomplicated: Secondary | ICD-10-CM | POA: Insufficient documentation

## 2018-03-13 DIAGNOSIS — M50221 Other cervical disc displacement at C4-C5 level: Secondary | ICD-10-CM | POA: Insufficient documentation

## 2018-03-13 DIAGNOSIS — D649 Anemia, unspecified: Secondary | ICD-10-CM | POA: Insufficient documentation

## 2018-03-13 DIAGNOSIS — G35 Multiple sclerosis: Secondary | ICD-10-CM

## 2018-03-13 DIAGNOSIS — H5461 Unqualified visual loss, right eye, normal vision left eye: Secondary | ICD-10-CM | POA: Insufficient documentation

## 2018-03-13 DIAGNOSIS — G4700 Insomnia, unspecified: Secondary | ICD-10-CM | POA: Diagnosis not present

## 2018-03-13 DIAGNOSIS — J341 Cyst and mucocele of nose and nasal sinus: Secondary | ICD-10-CM | POA: Insufficient documentation

## 2018-03-13 DIAGNOSIS — H348192 Central retinal vein occlusion, unspecified eye, stable: Secondary | ICD-10-CM | POA: Diagnosis not present

## 2018-03-13 DIAGNOSIS — Z90721 Acquired absence of ovaries, unilateral: Secondary | ICD-10-CM | POA: Insufficient documentation

## 2018-03-13 LAB — I-STAT CHEM 8, ED
BUN: 9 mg/dL (ref 6–20)
CHLORIDE: 106 mmol/L (ref 98–111)
Calcium, Ion: 1.19 mmol/L (ref 1.15–1.40)
Creatinine, Ser: 0.8 mg/dL (ref 0.44–1.00)
Glucose, Bld: 103 mg/dL — ABNORMAL HIGH (ref 70–99)
HCT: 40 % (ref 36.0–46.0)
Hemoglobin: 13.6 g/dL (ref 12.0–15.0)
Potassium: 3.4 mmol/L — ABNORMAL LOW (ref 3.5–5.1)
Sodium: 140 mmol/L (ref 135–145)
TCO2: 24 mmol/L (ref 22–32)

## 2018-03-13 LAB — RAPID URINE DRUG SCREEN, HOSP PERFORMED
Amphetamines: NOT DETECTED
Barbiturates: NOT DETECTED
Benzodiazepines: NOT DETECTED
Cocaine: NOT DETECTED
Opiates: NOT DETECTED
Tetrahydrocannabinol: NOT DETECTED

## 2018-03-13 LAB — CBG MONITORING, ED: Glucose-Capillary: 103 mg/dL — ABNORMAL HIGH (ref 70–99)

## 2018-03-13 LAB — I-STAT TROPONIN, ED: Troponin i, poc: 0 ng/mL (ref 0.00–0.08)

## 2018-03-13 LAB — COMPREHENSIVE METABOLIC PANEL
ALT: 24 U/L (ref 0–44)
AST: 23 U/L (ref 15–41)
Albumin: 4.2 g/dL (ref 3.5–5.0)
Alkaline Phosphatase: 55 U/L (ref 38–126)
Anion gap: 11 (ref 5–15)
BILIRUBIN TOTAL: 0.7 mg/dL (ref 0.3–1.2)
BUN: 9 mg/dL (ref 6–20)
CO2: 21 mmol/L — ABNORMAL LOW (ref 22–32)
Calcium: 10.4 mg/dL — ABNORMAL HIGH (ref 8.9–10.3)
Chloride: 106 mmol/L (ref 98–111)
Creatinine, Ser: 0.84 mg/dL (ref 0.44–1.00)
GFR calc Af Amer: >60 mL/min (ref >60)
GFR calc non Af Amer: >60 mL/min (ref >60)
Glucose, Bld: 99 mg/dL (ref 70–99)
Potassium: 3.5 mmol/L (ref 3.5–5.1)
Sodium: 138 mmol/L (ref 135–145)
Total Protein: 7.6 g/dL (ref 6.5–8.1)

## 2018-03-13 LAB — URINALYSIS, ROUTINE W REFLEX MICROSCOPIC
Bilirubin Urine: NEGATIVE
Glucose, UA: NEGATIVE mg/dL
Hgb urine dipstick: NEGATIVE
Ketones, ur: NEGATIVE mg/dL
Leukocytes, UA: NEGATIVE
Nitrite: NEGATIVE
Protein, ur: NEGATIVE mg/dL
Specific Gravity, Urine: 1.003 — ABNORMAL LOW (ref 1.005–1.030)
pH: 8 (ref 5.0–8.0)

## 2018-03-13 LAB — CBC
HCT: 41.1 % (ref 36.0–46.0)
Hemoglobin: 13.6 g/dL (ref 12.0–15.0)
MCH: 29.5 pg (ref 26.0–34.0)
MCHC: 33.1 g/dL (ref 30.0–36.0)
MCV: 89.2 fL (ref 80.0–100.0)
NRBC: 0 % (ref 0.0–0.2)
Platelets: 59 10*3/uL — ABNORMAL LOW (ref 150–400)
RBC: 4.61 MIL/uL (ref 3.87–5.11)
RDW: 12.3 % (ref 11.5–15.5)
WBC: 6.6 10*3/uL (ref 4.0–10.5)

## 2018-03-13 LAB — I-STAT BETA HCG BLOOD, ED (MC, WL, AP ONLY): I-stat hCG, quantitative: <5 m[IU]/mL (ref <5)

## 2018-03-13 LAB — PROTIME-INR
INR: 0.95
PROTHROMBIN TIME: 12.6 s (ref 11.4–15.2)

## 2018-03-13 LAB — APTT: aPTT: 28 seconds (ref 24–36)

## 2018-03-13 IMAGING — MR MR HEAD W/O CM
10 of 11 series · 42 of 48 positions shown · non-contrast
Comparison: Comparison made with prior CT from earlier the same day
as well as previous MRIs from [DATE] and [DATE].

CLINICAL DATA: Initial evaluation for acute right-sided numbness
with headache. History of cluster headaches/migraine, seizure, prior
right-sided visual loss.

EXAM:
MRI HEAD WITHOUT CONTRAST
TECHNIQUE: Multiplanar, multiecho pulse sequences of the brain and surrounding
structures were obtained without intravenous contrast.

[Series 5: DWI · axial · 3.0mm · 0.88mm/px · z∈[-110,+22]mm · 9 of 94 slices shown (1 of 4)]
[im 1/94]
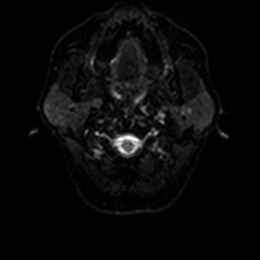
[im 12/94]
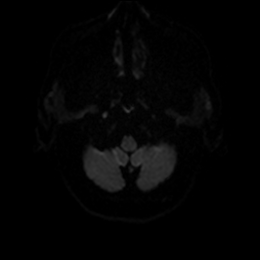
[im 24/94]
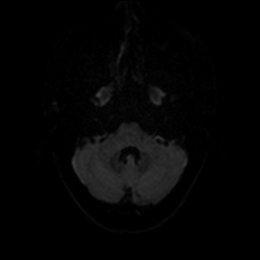
[im 35/94]
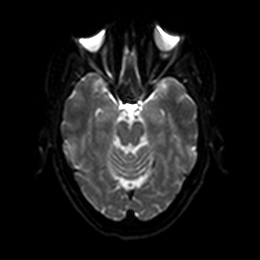
[im 47/94]
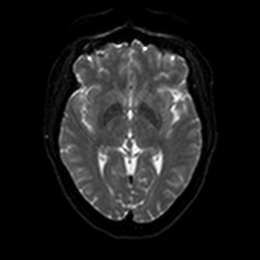
[im 59/94]
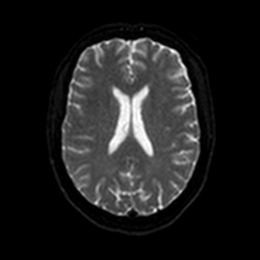
[im 70/94]
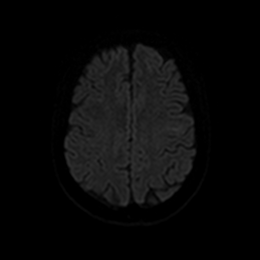
[im 82/94]
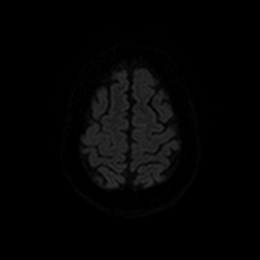
[im 94/94]
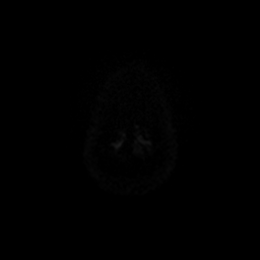

[Series 6: DWI · axial · 3.0mm · 0.88mm/px · z∈[-110,+22]mm · 4 of 47 slices shown (2 of 4)]
[im 1/47]
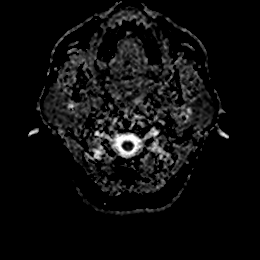
[im 16/47]
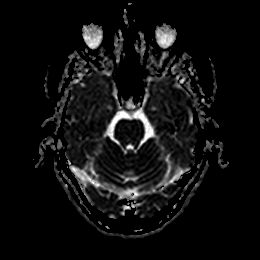
[im 31/47]
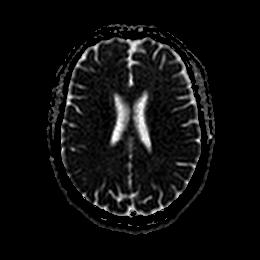
[im 47/47]
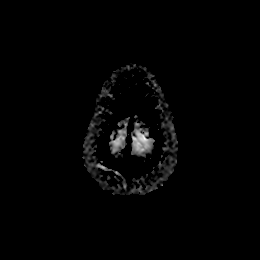

[Series 7: DWI · coronal · 4.0mm · 0.88mm/px · 6 of 64 slices shown (3 of 4)]
[im 1/64]
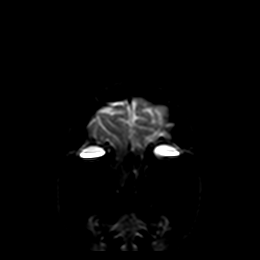
[im 13/64]
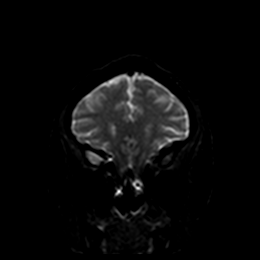
[im 26/64]
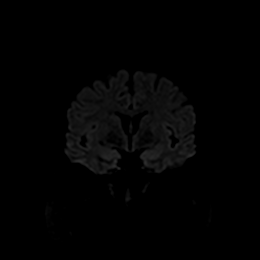
[im 38/64]
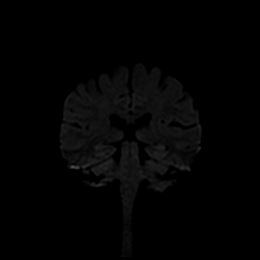
[im 51/64]
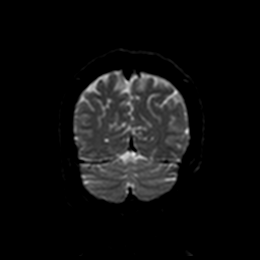
[im 64/64]
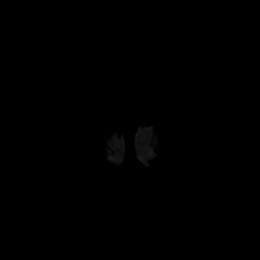

[Series 8: DWI · coronal · 4.0mm · 0.88mm/px · 3 of 32 slices shown (4 of 4)]
[im 1/32]
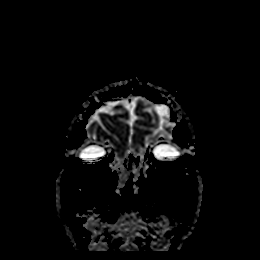
[im 16/32]
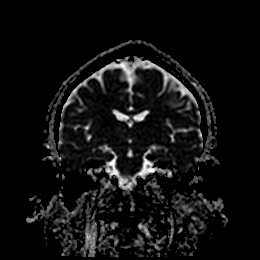
[im 32/32]
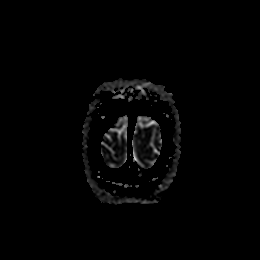

[Series 9: T1 · sagittal · 5.0mm · 0.75mm/px · 2 of 23 slices shown]
[im 1/23]
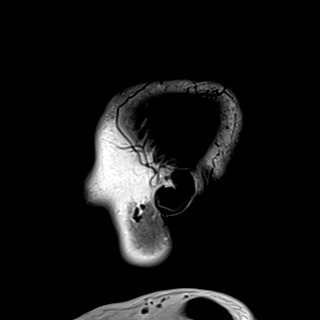
[im 23/23]
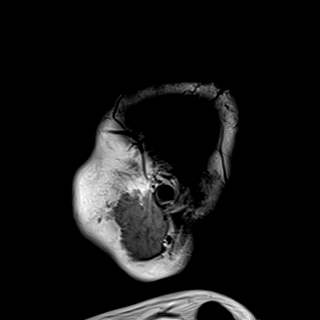

[Series 10: T2 · axial · 5.0mm · 0.72mm/px · z∈[-114,+24]mm · 2 of 25 slices shown]
[im 1/25]
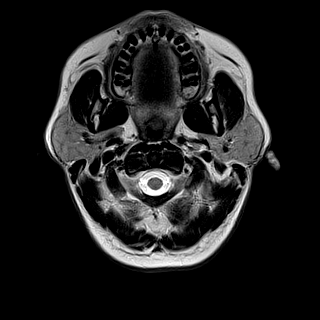
[im 25/25]
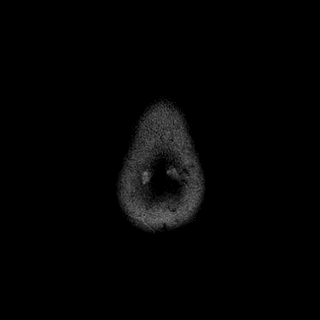

[Series 11: FLAIR · axial · 5.0mm · 0.45mm/px · z∈[-115,+23]mm · 2 of 25 slices shown]
[im 1/25]
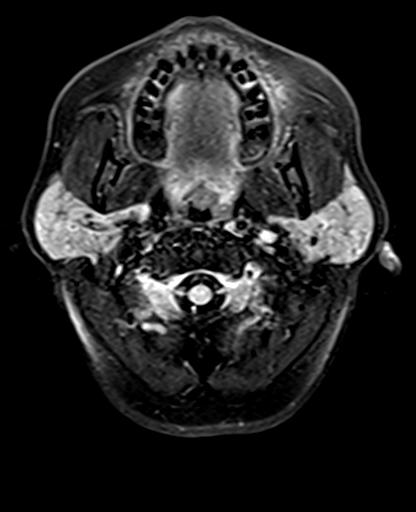
[im 25/25]
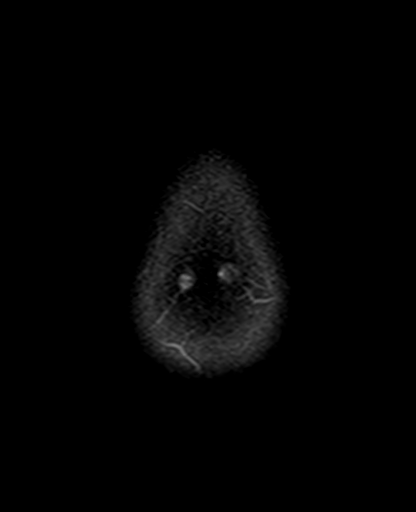

[Series 12: swi_images · axial · 3.0mm · 0.90mm/px · z∈[-122,+47]mm · 6 of 60 slices shown]
[im 1/60]
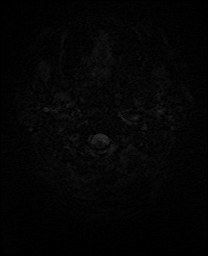
[im 12/60]
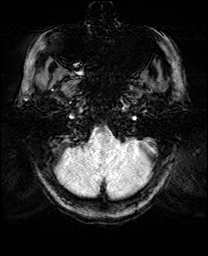
[im 24/60]
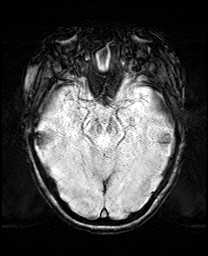
[im 36/60]
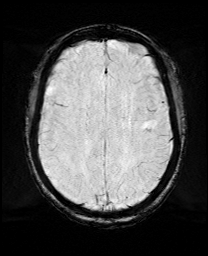
[im 48/60]
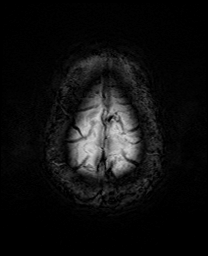
[im 60/60]
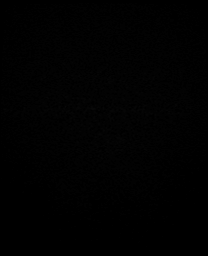

[Series 13: mip_images(sw) · axial · 24.0mm · 0.90mm/px · z∈[-112,+37]mm · 5 of 53 slices shown]
[im 1/53]
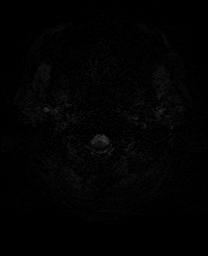
[im 14/53]
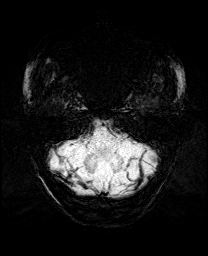
[im 27/53]
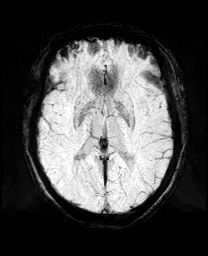
[im 40/53]
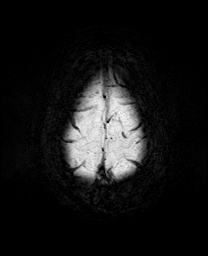
[im 53/53]
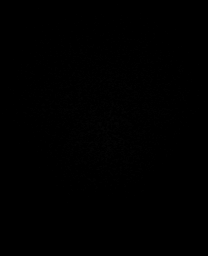

[Series 15: T2 post-contrast · coronal · 5.0mm · 0.72mm/px · 3 of 28 slices shown]
[im 1/28]
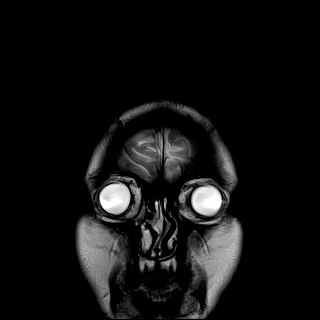
[im 14/28]
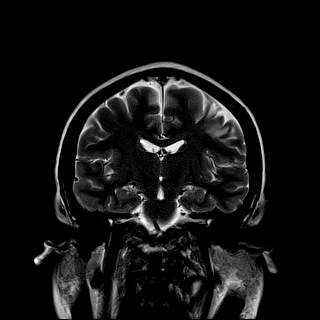
[im 28/28]
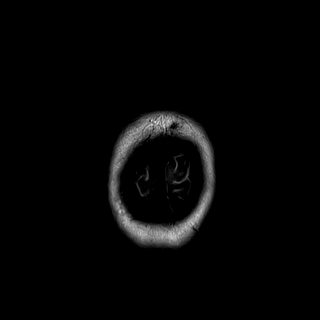

[42 of 48 positions shown; findings below may reference images not displayed]

FINDINGS: Brain: Cerebral volume stable, and within normal limits for age.
Again seen are 2 juxta cortical T2/FLAIR hyperintensities involving
the anterior left frontal lobe, previously seen on previous exam.
The more posterior lesion seen on FLAIR sequence image 18 appears
increased in size from previous, now measuring 2.2 cm in transverse
diameter, previously 1.3 cm in diameter. The more anterior lesion is
relatively stable in size and appearance. Adjacent T1 hypointense
black hole noted adjacent to this lesion. There is a new 13 mm juxta
cortical T2/FLAIR lesion at the parasagittal high left frontal lobe
(series 11, image 21). No associated restricted diffusion. No other
new lesions identified. No lesions within the right cerebral
hemisphere or infratentorial brain or brainstem.

No other abnormal foci of restricted diffusion to suggest acute or
subacute ischemia. Gray-white matter differentiation otherwise
maintained. No evidence for acute intracranial hemorrhage. Trace
chronic hemosiderin staining noted about 1 of the chronic left
frontal FLAIR signal abnormalities (series 12, image 40). No other
evidence for acute or chronic intracranial hemorrhage.

No other mass lesion, midline shift or mass effect. No
hydrocephalus. No extra-axial fluid collection. Pituitary gland
within normal limits. Midline structures intact and normal.

Vascular: Major intracranial vascular flow voids maintained.

Skull and upper cervical spine: Craniocervical junction normal.
Upper cervical spine within normal limits. No focal marrow replacing
lesion. Scalp soft tissues unremarkable.

Sinuses/Orbits: Globes and orbital soft tissues within normal
limits. Small right maxillary sinus retention cyst. Mild scattered
mucosal thickening within the ethmoidal air cells. No mastoid
effusion.

Other: None.
IMPRESSION: 1. New 13 mm juxta cortical FLAIR signal abnormality at the
parasagittal left frontal lobe as above. Previously identified two
additional chronic juxta cortical FLAIR hyperintensities again seen,
the more posterior of which is slightly more prominent as compared
to previous exam. Findings are nonspecific, and could reflect the
sequelae of underlying demyelinating disease. An infectious or
inflammatory process, or changes related to vasculitis could also be
considered. Subacute ischemia is also possible, but felt to be less
likely given the patient's presenting symptoms. Tumor felt to be
unlikely given the overall appearance and previously seen waxing and
waning enhancement about the pre-existing left frontal lesions.
Further assessment with postcontrast imaging could be performed for
further evaluation as desired, as there is anticipated to be a small
amount of enhancement about the new parasagittal lesion.
2. Otherwise stable normal MRI appearance of the brain.

## 2018-03-13 IMAGING — CT CT HEAD CODE STROKE
4 series · 15 of 47 positions shown, 17 images · non-contrast
Comparison: MRI brain [DATE]

CLINICAL DATA: Code stroke. Acute onset of right-sided facial droop
and weakness began 45 minutes ago.

EXAM:
CT HEAD WITHOUT CONTRAST
TECHNIQUE: Contiguous axial images were obtained from the base of the skull
through the vertex without intravenous contrast.

[Series 3: head wo · axial · 0.41mm/px · z∈[-96,+24]mm · 7 of 32 slices shown, 9 images]
[im 4/32  brain]
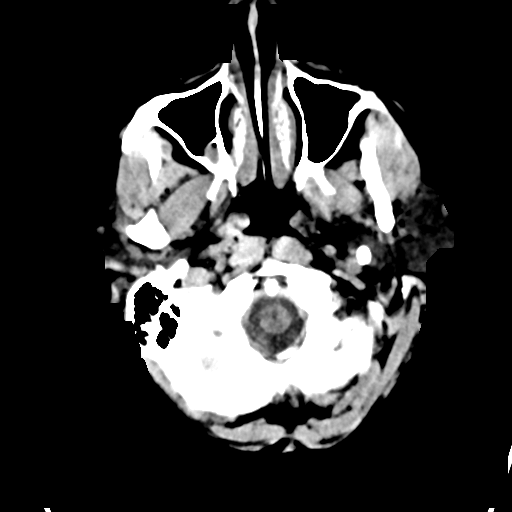
[im 4/32  bone]
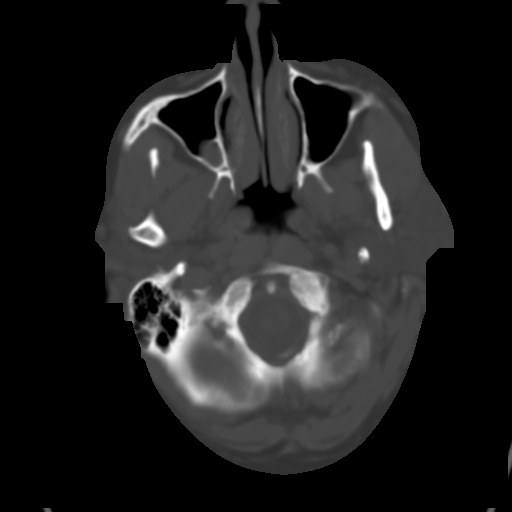
[im 8/32  brain]
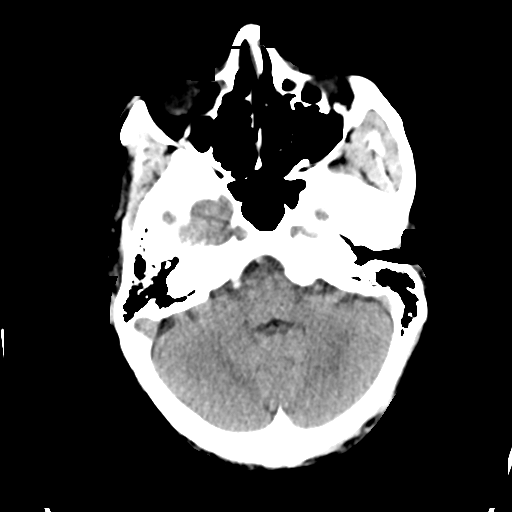
[im 12/32  brain]
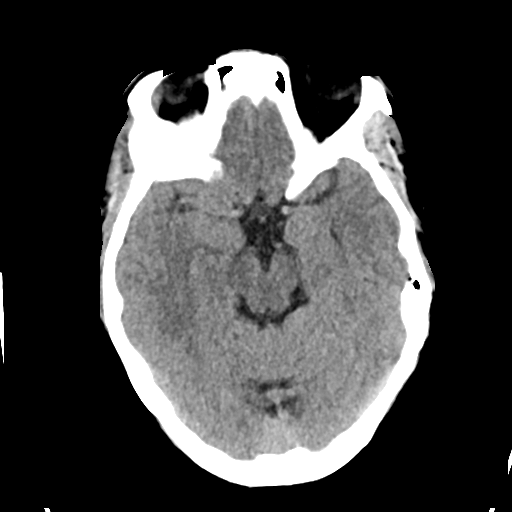
[im 16/32  brain]
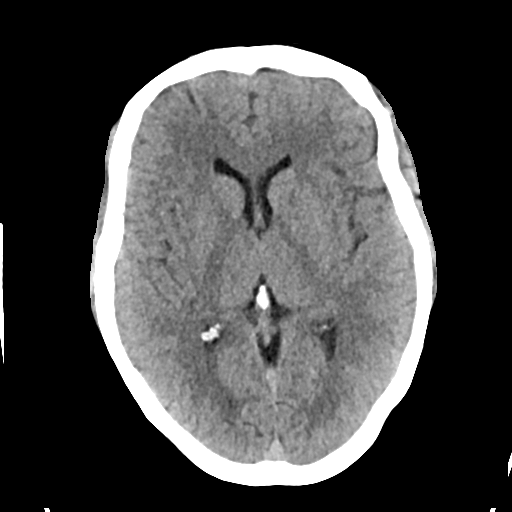
[im 20/32  brain]
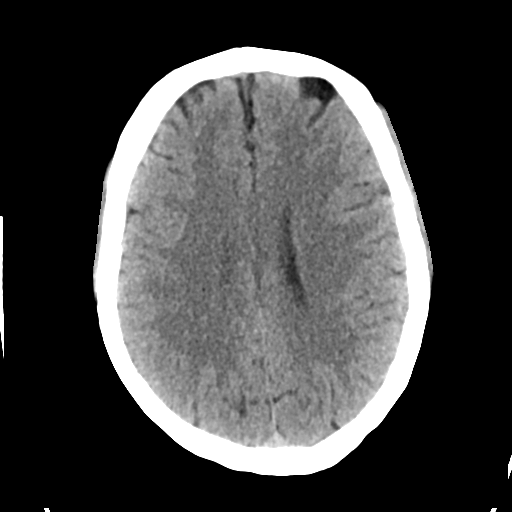
[im 20/32  bone]
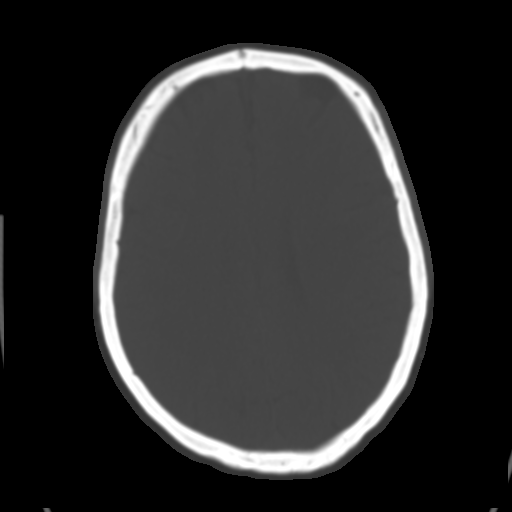
[im 24/32  brain]
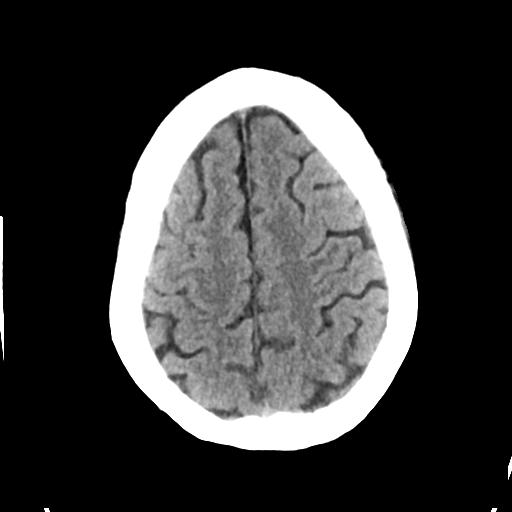
[im 28/32  brain]
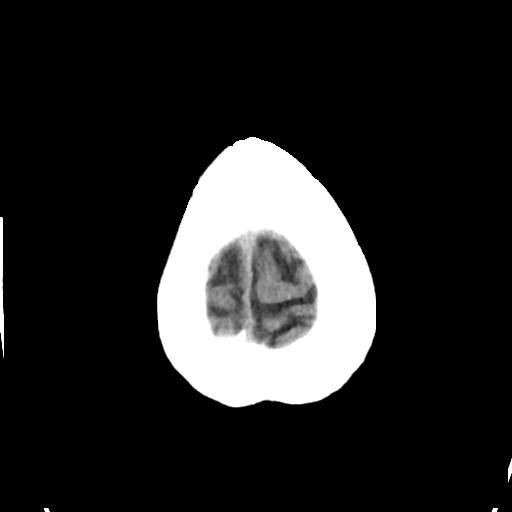

[Series 4: head bone · axial · 0.41mm/px · z∈[-96,-80]mm · 2 of 79 slices shown]
[im 8/79  bone]
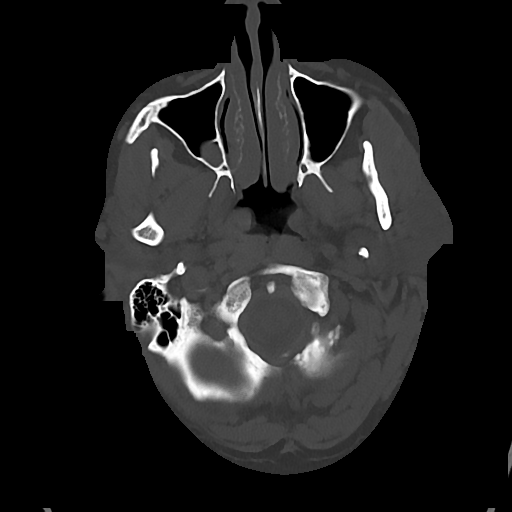
[im 16/79  bone]
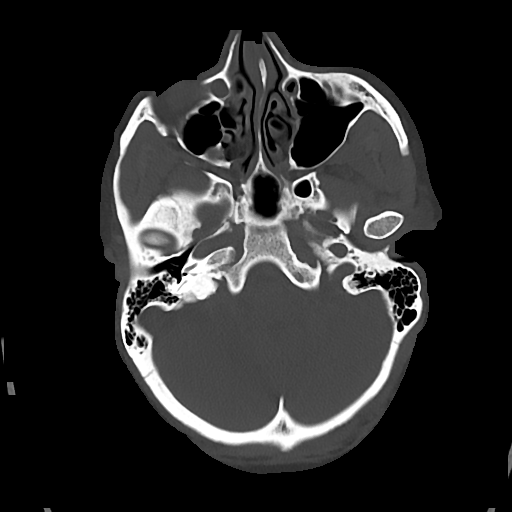

[Series 5: cor soft · coronal · 0.30mm/px · 3 of 72 slices shown]
[im 24/72  brain]
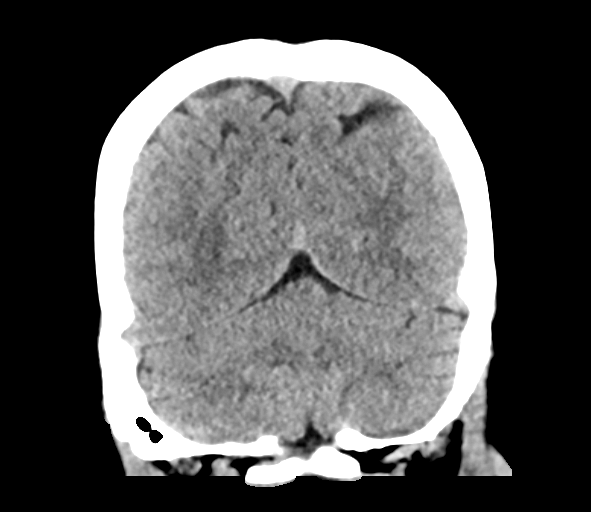
[im 32/72  brain]
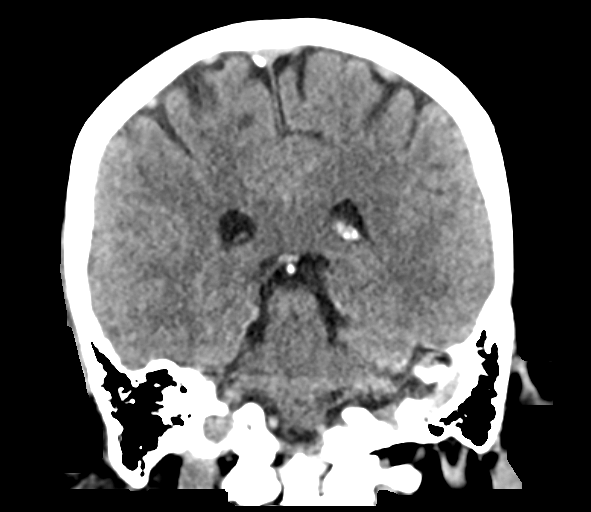
[im 40/72  brain]
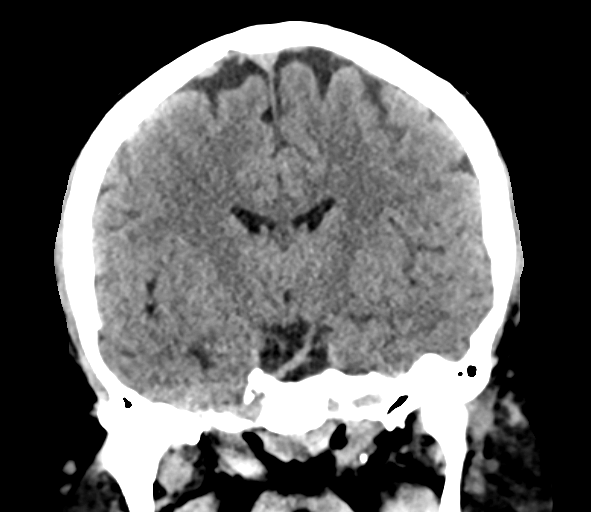

[Series 6: sag soft · sagittal · 0.30mm/px · 3 of 55 slices shown]
[im 19/55  brain]
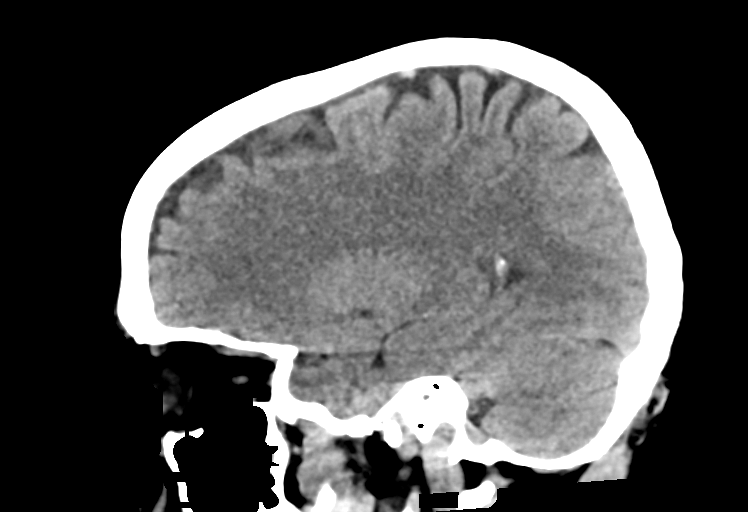
[im 28/55  brain]
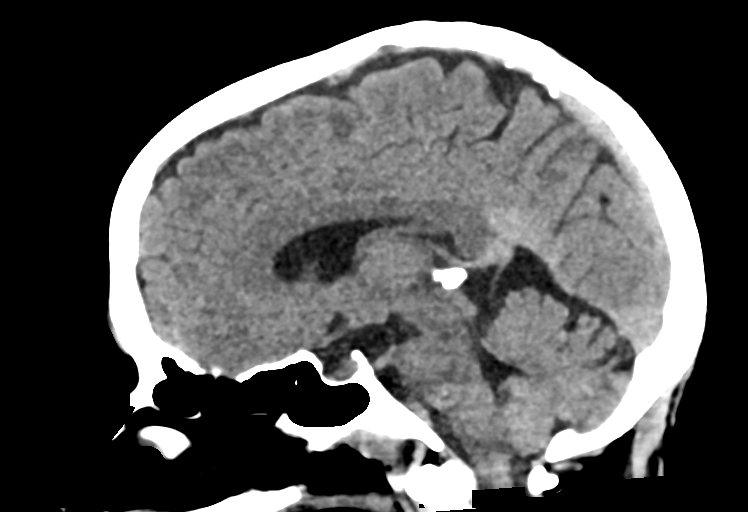
[im 37/55  brain]
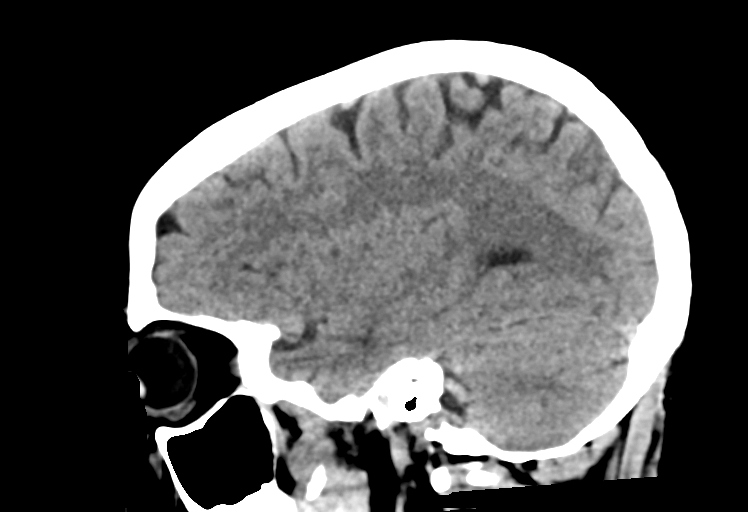

[15 of 47 positions shown; findings below may reference images not displayed]

FINDINGS: Brain: Remote high left frontal lobe infarct is again seen on image
25. No acute infarct, hemorrhage, or mass lesion is present. No
significant white matter lesions are present. Basal ganglia are
intact. Insular ribbon is normal The brainstem and cerebellum are
within normal limits. The ventricles are of normal size. No
significant extraaxial fluid collection is present.

Vascular: No hyperdense vessel or unexpected calcification.

Skull: Calvarium is intact. No focal lytic or blastic lesions are
present.

Sinuses/Orbits: A polyp or mucous retention cyst is again noted in
the posterior right maxillary sinus. The paranasal sinuses and
mastoid air cells are otherwise clear. The globes and orbits are
within normal limits.

ASPECTS ([place] Stroke Program Early CT Score)

- Ganglionic level infarction (caudate, lentiform nuclei, internal
capsule, insula, M1-M3 cortex): [DATE]

- Supraganglionic infarction (M4-M6 cortex): [DATE]

Total score (0-10 with 10 being normal): [DATE]
IMPRESSION: 1. No acute intracranial abnormality.
2. Stable remote left frontal lobe infarct.
3. ASPECTS is [DATE]

The above was relayed via text pager to Dr. AMAZIGH on [DATE] at

## 2018-03-13 MED ORDER — BISACODYL 5 MG PO TBEC: 5.0000 mg | DELAYED_RELEASE_TABLET | Freq: Every day | ORAL | Status: DC | PRN

## 2018-03-13 MED ORDER — ATROPINE SULFATE 1 % OP SOLN
1.0000 [drp] | Freq: Three times a day (TID) | OPHTHALMIC | Status: DC
  Administered 2018-03-14 – 2018-03-15 (×6): 1 [drp] via OPHTHALMIC
  Filled 2018-03-13: qty 2

## 2018-03-13 MED ORDER — PROCHLORPERAZINE EDISYLATE 10 MG/2ML IJ SOLN
10.0000 mg | Freq: Once | INTRAMUSCULAR | Status: AC
  Administered 2018-03-13: 10 mg via INTRAVENOUS
  Filled 2018-03-13: qty 2

## 2018-03-13 MED ORDER — ONDANSETRON HCL 4 MG PO TABS
4.0000 mg | ORAL_TABLET | Freq: Four times a day (QID) | ORAL | Status: DC | PRN
  Administered 2018-03-15: 4 mg via ORAL
  Filled 2018-03-13: qty 1

## 2018-03-13 MED ORDER — FLUTICASONE PROPIONATE 50 MCG/ACT NA SUSP: 1.0000 | Freq: Every day | NASAL | Status: DC | PRN

## 2018-03-13 MED ORDER — CLONAZEPAM 0.5 MG PO TABS
0.5000 mg | ORAL_TABLET | Freq: Two times a day (BID) | ORAL | Status: DC | PRN
  Administered 2018-03-14 (×2): 0.5 mg via ORAL
  Filled 2018-03-13 (×2): qty 1

## 2018-03-13 MED ORDER — ONDANSETRON HCL 4 MG/2ML IJ SOLN: 4.0000 mg | Freq: Four times a day (QID) | INTRAMUSCULAR | Status: DC | PRN

## 2018-03-13 MED ORDER — SUMATRIPTAN SUCCINATE 100 MG PO TABS
100.0000 mg | ORAL_TABLET | Freq: Once | ORAL | Status: DC | PRN
  Filled 2018-03-13: qty 1

## 2018-03-13 MED ORDER — SODIUM CHLORIDE 0.9 % IV BOLUS
1000.0000 mL | Freq: Once | INTRAVENOUS | Status: AC
  Administered 2018-03-13: 1000 mL via INTRAVENOUS

## 2018-03-13 MED ORDER — FLUOXETINE HCL 20 MG PO CAPS
20.0000 mg | ORAL_CAPSULE | Freq: Every day | ORAL | Status: DC | PRN
  Filled 2018-03-13: qty 1

## 2018-03-13 MED ORDER — POTASSIUM CHLORIDE CRYS ER 20 MEQ PO TBCR
60.0000 meq | EXTENDED_RELEASE_TABLET | Freq: Once | ORAL | Status: AC
  Administered 2018-03-14: 60 meq via ORAL
  Filled 2018-03-13: qty 3

## 2018-03-13 MED ORDER — DIPHENHYDRAMINE HCL 50 MG/ML IJ SOLN
25.0000 mg | Freq: Once | INTRAMUSCULAR | Status: AC
  Administered 2018-03-13: 25 mg via INTRAVENOUS
  Filled 2018-03-13: qty 1

## 2018-03-13 MED ORDER — BRIMONIDINE TARTRATE 0.2 % OP SOLN
1.0000 [drp] | Freq: Three times a day (TID) | OPHTHALMIC | Status: DC
  Administered 2018-03-14 – 2018-03-15 (×6): 1 [drp] via OPHTHALMIC
  Filled 2018-03-13: qty 5

## 2018-03-13 MED ORDER — LATANOPROST 0.005 % OP SOLN
1.0000 [drp] | Freq: Every day | OPHTHALMIC | Status: DC
  Administered 2018-03-14 (×2): 1 [drp] via OPHTHALMIC
  Filled 2018-03-13: qty 2.5

## 2018-03-13 MED ORDER — ACETAMINOPHEN 650 MG RE SUPP: 650.0000 mg | Freq: Four times a day (QID) | RECTAL | Status: DC | PRN

## 2018-03-13 MED ORDER — CALCIUM CARBONATE ANTACID 500 MG PO CHEW
3.0000 | CHEWABLE_TABLET | Freq: Three times a day (TID) | ORAL | Status: DC | PRN
  Filled 2018-03-13: qty 3

## 2018-03-13 MED ORDER — DEXAMETHASONE SODIUM PHOSPHATE 10 MG/ML IJ SOLN
10.0000 mg | Freq: Once | INTRAMUSCULAR | Status: AC
  Administered 2018-03-13: 10 mg via INTRAVENOUS
  Filled 2018-03-13: qty 1

## 2018-03-13 MED ORDER — LORAZEPAM 2 MG/ML IJ SOLN
1.0000 mg | Freq: Once | INTRAMUSCULAR | Status: AC
  Administered 2018-03-13: 1 mg via INTRAVENOUS
  Filled 2018-03-13: qty 1

## 2018-03-13 MED ORDER — DORZOLAMIDE HCL-TIMOLOL MAL 2-0.5 % OP SOLN
1.0000 [drp] | Freq: Two times a day (BID) | OPHTHALMIC | Status: DC
  Administered 2018-03-14 – 2018-03-15 (×4): 1 [drp] via OPHTHALMIC
  Filled 2018-03-13: qty 10

## 2018-03-13 MED ORDER — ACETAMINOPHEN 325 MG PO TABS: 650.0000 mg | ORAL_TABLET | Freq: Four times a day (QID) | ORAL | Status: DC | PRN

## 2018-03-13 NOTE — ED Triage Notes (Signed)
Pt arrived [facility] from home for c/o R sided facial droop and right arm/leg weakness. Pt is a&Ox4 slow to respond. C/o headache as well. Code stroke activated in the field.  BP 121/84 P 64 CBG 101

## 2018-03-13 NOTE — ED Notes (Signed)
Patient transported to MRI 

## 2018-03-13 NOTE — ED Notes (Signed)
ED Provider at bedside. 

## 2018-03-13 NOTE — ED Notes (Signed)
Pt stands to use bedside toilet. Pt complains of right side weakness, but bears weight own own ability.

## 2018-03-13 NOTE — Code Documentation (Signed)
43yo female arriving to [facility] via [place] at 1633. Patient from home where she was LKW at 1600. Patient reports sudden onset right facial droop and right sided weakness. Patient endorses headache. EMS called and activated code stroke for right facial droop, right sided weakness and slow speech. Stroke team at the bedside on patient arrival. Labs attempted and patient cleared for CT by Dr. Dayna Barker. Patient to CT with team. CT completed. NIHSS 1, see documentation for details and code stroke times. Patient endorses headache and is tearful on exam. Patient is slow to speak and complete tasks during exam. Patient reports subjective right sided decreased sensation on exam. Of note, patient splits the midline on exam. Dr. Lorraine Lax at the bedside. No acute stroke treatment at this time as symptoms are mild. Code stroke canceled. Bedside handoff with ED RN Donah Driver.

## 2018-03-13 NOTE — Progress Notes (Signed)
MRI with new juxtacoritcal lesion in the same region as her previous areas. Her spell today is concerning for possible partial seizure and she does have a history of seizure.   I would favor getting EEG and consideration of starting keppra following this. Also will get CTA head and neck to look for vascular issues that could contribute to this finding.   Finally, will get repeat imaging with contrast and I will get spinal imaging as well to look for any lesions that might be suggestive of demyelination.   Would not pursue steroids at this time.   Roland Rack, MD [facility] [phone]  If 7pm- 7am, please page neurology on call as listed in [place].

## 2018-03-13 NOTE — ED Provider Notes (Signed)
[facility] EMERGENCY DEPARTMENT Provider Note   CSN: [id number] Arrival date & time: 03/13/18  1636   An emergency department physician performed an initial assessment on this suspected stroke patient at 1634([place]).  History   Chief Complaint Chief Complaint  Patient presents with  . Weakness    HPI Angel Rodriguez is a 43 y.o. female with history of anxiety, cluster headaches, migraine headaches, seizures, thrombocytopenia, depression, fibroids, GERD presenting for evaluation of acute onset, persistent right-sided numbness and headaches.  She is a poor historian and has difficulty answering questions but reports that just prior to arrival at around 4 PM she began feeling numbness of her right hand which eventually extended up the right upper extremity and to the right lower extremity.  She states that this feels similarly to seizures which she has experienced in the past. She also feels as though she has right-sided facial numbness.  She notes a right-sided headache which she describes as a pressure which worsens sitting upright.  She denies phonophobia or photophobia.  She reports this headache is different from her usual migraine headaches.  She noted facial droop as well.  The numbness appears to be waxing and waning.  She appears extremely anxious.  She endorses nausea but no vomiting.  A code stroke was called out in the field and the patient was assessed by Dr. Lorraine Lax with neurology.  The history is provided by the patient.    Past Medical History:  Diagnosis Date  . Anxiety   . 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 Cluster headaches    started after seizure and migraines  . Complication of anesthesia    trouble with short term memory after surgeries  . Depression   . Fibroids    s/p TLH, bilateral salpingectomy  . GERD (gastroesophageal reflux disease)    patient thinks due to medications  . History of anemia    prior to hysterectomy  . History  of pneumonia   . History of trichomoniasis   . Seizures ([place]) x 1 2-3 yrs ago none since   once saw neurologist, full body brain MRI and another 6 months lster  . Status post right oophorectomy   . Thrombocytopenia ([place])    sees [place] with Heme Onc    Patient Active Problem List   Diagnosis Date Noted  . Thrombocytopenia ([place]) 03/14/2018  . Right sided weakness 03/13/2018  . 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 Abnormal MRI of head 01/21/2016  . Vascular headache 09/15/2015  . Insomnia 09/15/2015  . Headache 07/08/2015  . Headache, chronic migraine without aura, intractable 07/08/2015  . Anxiety 07/08/2015  . Neck pain 06/24/2015  . Seizure ([place]) 06/24/2015  . Central retinal vein occlusion 12/11/2011  . Cotton wool exudates 12/11/2011  . Cystoid macular edema 12/11/2011    Past Surgical History:  Procedure Laterality Date  . CYSTOSCOPY N/A 07/31/2017   Procedure: CYSTOSCOPY;  Surgeon: Megan Salon, MD;  Location: [facility];  Service: Gynecology;  Laterality: N/A;  . ENDOMETRIAL ABLATION    . EYE SURGERY     laser - removed blood from right eye  . HYSTEROSCOPY W/D&C    . LAPAROSCOPY N/A 09/19/2017   Procedure: LAPAROSCOPY DIAGNOSTIC WITH RIGHT OOPHERECTOMY, LYSIS OF ADHESIONS;  Surgeon: Salvadore Dom, MD;  Location: [facility];  Service: Gynecology;  Laterality: N/A;  . OMENTECTOMY N/A 11/21/2017   Procedure: OMENTECTOMY AND PELVIC WASHINGS;  Surgeon: Isabel Caprice, MD;  Location: [facility];  Service: Gynecology;  Laterality: N/A;  . ROBOTIC ASSISTED SALPINGO OOPHERECTOMY Left 11/21/2017   Procedure: XI ROBOTIC ASSISTED LEFT SALPINGO OOPHORECTOMY;  Surgeon: Isabel Caprice, MD;  Location: [facility];  Service: Gynecology;  Laterality: Left;  . TOTAL LAPAROSCOPIC HYSTERECTOMY WITH SALPINGECTOMY Bilateral 07/31/2017   Procedure: TOTAL LAPAROSCOPIC HYSTERECTOMY WITH SALPINGECTOMY;  Surgeon: Megan Salon, MD;  Location: [facility];  Service: Gynecology;  Laterality:  Bilateral;  20 week size uterus/ Alexis bag in room/ need 4.5 hours  . TUBAL LIGATION    . WISDOM TOOTH EXTRACTION       OB History    Gravida  5   Para  3   Term  3   Preterm  0   AB  2   Living  3     SAB  1   TAB      Ectopic  1   Multiple      Live Births               Home Medications    Prior to Admission medications   Medication Sig Start Date End Date Taking? Authorizing Provider  acetaminophen-codeine (TYLENOL #3) 300-30 MG tablet TAKE 1 TO 2 TABS BY MOUTH EVERY 4 HOURS AS NEEDED FOR MOD PAIN OR SEVERE PAIN (FOR POST OP PAIN) 11/28/17  Yes Sater, Nanine Means, MD  atropine 1 % ophthalmic solution Place 1 drop into the right eye 3 (three) times daily.  01/10/16  Yes [provider]  bisacodyl (DULCOLAX) 5 MG EC tablet Take 1 tablet (5 mg total) by mouth daily as needed for moderate constipation. 11/21/17  Yes Everitt Amber, MD  brimonidine (ALPHAGAN P) 0.1 % SOLN Place 1 drop into the right eye 3 (three) times daily.  06/21/15  Yes [provider]  calcium carbonate (TUMS EX) 750 MG chewable tablet Chew 2 tablets by mouth as needed for heartburn.   Yes [provider]  clonazePAM (KLONOPIN) 0.5 MG tablet Take 1 tablet (0.5 mg total) by mouth 2 (two) times daily as needed. for anxiety 10/29/17  Yes Sater, Nanine Means, MD  Cyanocobalamin (VITAMIN B-12 PO) Take 1 tablet by mouth daily.    Yes [provider]  dorzolamide-timolol (COSOPT) 22.3-6.8 MG/ML ophthalmic solution Place 1 drop into the right eye 2 (two) times daily.  06/21/15  Yes [provider]  FLUoxetine (PROZAC) 20 MG capsule Take 1 capsule (20 mg total) by mouth daily. Patient taking differently: Take 20 mg by mouth daily as needed (anxiety).  10/25/17  Yes Sater, Nanine Means, MD  fluticasone (FLONASE) 50 MCG/ACT nasal spray Place 1-2 sprays into both nostrils daily as needed for allergies.    Yes [provider]  latanoprost (XALATAN) 0.005 % ophthalmic  solution Place 1 drop into both eyes at bedtime.   Yes [provider]  Multiple Vitamin (MULTI-VITAMINS) TABS Take 1 tablet by mouth daily.    Yes [provider]  SUMAtriptan (IMITREX) 100 MG tablet Take 1 tablet (100 mg total) by mouth once as needed for migraine. May repeat in 2 hours if headache persists or recurs. 04/20/17  Yes Sater, Nanine Means, MD  verapamil (CALAN-SR) 240 MG CR tablet Take 1 tablet (240 mg total) by mouth at bedtime. Patient taking differently: Take 240 mg by mouth at bedtime. For migraines 10/30/17  Yes Sater, Nanine Means, MD    Family History Family History  Problem Relation Age of Onset  . Alcoholism Father   .  Cirrhosis Father   . Cancer Maternal Grandmother        unsure of type; died of other causes  . Heart disease Maternal Aunt     Social History Social History   Tobacco Use  . Smoking status: Current Some Day Smoker    Years: 20.00    Types: E-cigarettes, Cigarettes  . Smokeless tobacco: Never Used  . Tobacco comment: occasional cigerate and vap  Substance Use Topics  . Alcohol use: Yes    Comment: occ wine  . Drug use: No     Allergies   Ivp dye [iodinated diagnostic agents] and Bee venom   Review of Systems Review of Systems  Constitutional: Negative for chills and fever.  Eyes: Negative for photophobia and visual disturbance.  Respiratory: Negative for shortness of breath.   Cardiovascular: Negative for chest pain.  Gastrointestinal: Positive for nausea. Negative for abdominal pain and vomiting.  Neurological: Positive for facial asymmetry, numbness and headaches.  All other systems reviewed and are negative.    Physical Exam Updated Vital Signs BP 108/73   Pulse (!) 58   Temp 97.6 F (36.4 C)   Resp 17   Wt 81.4 kg   LMP 05/02/2017   SpO2 98%   BMI 35.05 kg/m   Physical Exam Vitals signs and nursing note reviewed.  Constitutional:      General: She is not in acute distress.    Appearance: She is  well-developed.     Comments: Appears anxious  HENT:     Head: Normocephalic and atraumatic.  Eyes:     General:        Right eye: No discharge.        Left eye: No discharge.     Extraocular Movements: Extraocular movements intact.     Conjunctiva/sclera: Conjunctivae normal.     Pupils: Pupils are equal, round, and reactive to light.  Neck:     Vascular: No JVD.     Trachea: No tracheal deviation.  Cardiovascular:     Rate and Rhythm: Normal rate.     Pulses: Normal pulses.     Heart sounds: Normal heart sounds.  Pulmonary:     Effort: Pulmonary effort is normal.     Breath sounds: Normal breath sounds.  Abdominal:     General: There is no distension.     Tenderness: There is no abdominal tenderness. There is no guarding or rebound.  Skin:    General: Skin is warm and dry.     Findings: No erythema.  Neurological:     Mental Status: She is alert.     Comments: Fluent speech with no evidence of dysarthria or aphasia but she answers questions slowly.  Mild asymmetry with smile on the right.  Eyebrows raise symmetrically.  No tongue fasciculations and tongue protrudes in the midline.  Palate elevates symmetrically.  Altered sensation to soft touch of the right side of the face as compared to the left.  5/5 strength of BUE and BLE major muscle groups.  No pronator drift.    Altered sensation to soft touch of right upper and lower extremities as compared to the left.  No visual acuity deficits.  Normal finger-to-nose of the bilateral upper extremities though she performs the motions very slowly.   Psychiatric:        Behavior: Behavior normal.      ED Treatments / Results  Labs (all labs ordered are listed, but only abnormal results are displayed) Labs Reviewed  COMPREHENSIVE METABOLIC  PANEL - Abnormal; Notable for the following components:      Result Value   CO2 21 (*)    Calcium 10.4 (*)    All other components within normal limits  CBC - Abnormal; Notable for  the following components:   Platelets 59 (*)    All other components within normal limits  URINALYSIS, ROUTINE W REFLEX MICROSCOPIC - Abnormal; Notable for the following components:   Color, Urine STRAW (*)    Specific Gravity, Urine 1.003 (*)    All other components within normal limits  CBG MONITORING, ED - Abnormal; Notable for the following components:   Glucose-Capillary 103 (*)    All other components within normal limits  I-STAT CHEM 8, ED - Abnormal; Notable for the following components:   Potassium 3.4 (*)    Glucose, Bld 103 (*)    All other components within normal limits  PROTIME-INR  APTT  RAPID URINE DRUG SCREEN, HOSP PERFORMED  CBC  COMPREHENSIVE METABOLIC PANEL  HEMOGLOBIN A1C  LIPID PANEL  I-STAT TROPONIN, ED  I-STAT BETA HCG BLOOD, ED ([facility], [facility], [facility] ONLY)    EKG EKG Interpretation  Date/Time:  Wednesday March 13 2018 16:54:21 EST Ventricular Rate:  59 PR Interval:    QRS Duration: 91 QT Interval:  417 QTC Calculation: 414 R Axis:   49 Text Interpretation:  Sinus rhythm Borderline repolarization abnormality Artifact No old tracing to compare Confirmed by Dorie Rank [phone]) on 03/13/2018 5:12:42 PM   Radiology Mr Brain Wo Contrast  Result Date: 03/13/2018 CLINICAL DATA:  Initial evaluation for acute right-sided numbness with headache. History of cluster headaches/migraine, seizure, prior right-sided visual loss. EXAM: MRI HEAD WITHOUT CONTRAST TECHNIQUE: Multiplanar, multiecho pulse sequences of the brain and surrounding structures were obtained without intravenous contrast. COMPARISON:  Comparison made with prior CT from earlier the same day as well as previous MRIs from 02/14/2016 and 06/30/2015. FINDINGS: Brain: Cerebral volume stable, and within normal limits for age. Again seen are 2 juxta cortical T2/FLAIR hyperintensities involving the anterior left frontal lobe, previously seen on previous exam. The more posterior lesion seen on FLAIR sequence image 18  appears increased in size from previous, now measuring 2.2 cm in transverse diameter, previously 1.3 cm in diameter. The more anterior lesion is relatively stable in size and appearance. Adjacent T1 hypointense black hole noted adjacent to this lesion. There is a new 13 mm juxta cortical T2/FLAIR lesion at the parasagittal high left frontal lobe (series 11, image 21). No associated restricted diffusion. No other new lesions identified. No lesions within the right cerebral hemisphere or infratentorial brain or brainstem. No other abnormal foci of restricted diffusion to suggest acute or subacute ischemia. Gray-white matter differentiation otherwise maintained. No evidence for acute intracranial hemorrhage. Trace chronic hemosiderin staining noted about 1 of the chronic left frontal FLAIR signal abnormalities (series 12, image 40). No other evidence for acute or chronic intracranial hemorrhage. No other mass lesion, midline shift or mass effect. No hydrocephalus. No extra-axial fluid collection. Pituitary gland within normal limits. Midline structures intact and normal. Vascular: Major intracranial vascular flow voids maintained. Skull and upper cervical spine: Craniocervical junction normal. Upper cervical spine within normal limits. No focal marrow replacing lesion. Scalp soft tissues unremarkable. Sinuses/Orbits: Globes and orbital soft tissues within normal limits. Small right maxillary sinus retention cyst. Mild scattered mucosal thickening within the ethmoidal air cells. No mastoid effusion. Other: None. IMPRESSION: 1. New 13 mm juxta cortical FLAIR signal abnormality at the parasagittal left frontal lobe as  above. Previously identified two additional chronic juxta cortical FLAIR hyperintensities again seen, the more posterior of which is slightly more prominent as compared to previous exam. Findings are nonspecific, and could reflect the sequelae of underlying demyelinating disease. An infectious or  inflammatory process, or changes related to vasculitis could also be considered. Subacute ischemia is also possible, but felt to be less likely given the patient's presenting symptoms. Tumor felt to be unlikely given the overall appearance and previously seen waxing and waning enhancement about the pre-existing left frontal lesions. Further assessment with postcontrast imaging could be performed for further evaluation as desired, as there is anticipated to be a small amount of enhancement about the new parasagittal lesion. 2. Otherwise stable normal MRI appearance of the brain. Electronically Signed   By: Jeannine Boga M.D.   On: 03/13/2018 22:40   Ct Head Code Stroke Wo Contrast  Result Date: 03/13/2018 CLINICAL DATA:  Code stroke. Acute onset of right-sided facial droop and weakness began 45 minutes ago. EXAM: CT HEAD WITHOUT CONTRAST TECHNIQUE: Contiguous axial images were obtained from the base of the skull through the vertex without intravenous contrast. COMPARISON:  MRI brain 02/14/2016 FINDINGS: Brain: Remote high left frontal lobe infarct is again seen on image 25. No acute infarct, hemorrhage, or mass lesion is present. No significant white matter lesions are present. Basal ganglia are intact. Insular ribbon is normal The brainstem and cerebellum are within normal limits. The ventricles are of normal size. No significant extraaxial fluid collection is present. Vascular: No hyperdense vessel or unexpected calcification. Skull: Calvarium is intact. No focal lytic or blastic lesions are present. Sinuses/Orbits: A polyp or mucous retention cyst is again noted in the posterior right maxillary sinus. The paranasal sinuses and mastoid air cells are otherwise clear. The globes and orbits are within normal limits. ASPECTS [facility] Stroke Program Early CT Score) - Ganglionic level infarction (caudate, lentiform nuclei, internal capsule, insula, M1-M3 cortex): 7/7 - Supraganglionic infarction (M4-M6 cortex):  3/3 Total score (0-10 with 10 being normal): 10/10 IMPRESSION: 1. No acute intracranial abnormality. 2. Stable remote left frontal lobe infarct. 3. ASPECTS is 10/10 The above was relayed via text pager to Dr. Lorraine Lax on 03/13/2018 at 16:54 . Electronically Signed   By: San Morelle M.D.   On: 03/13/2018 16:55    Procedures Procedures (including critical care time)  Medications Ordered in ED Medications  SUMAtriptan (IMITREX) tablet 100 mg (has no administration in time range)  FLUoxetine (PROZAC) capsule 20 mg (has no administration in time range)  bisacodyl (DULCOLAX) EC tablet 5 mg (has no administration in time range)  calcium carbonate (TUMS - dosed in mg elemental calcium) chewable tablet 600 mg of elemental calcium (has no administration in time range)  clonazePAM (KLONOPIN) tablet 0.5 mg (has no administration in time range)  fluticasone (FLONASE) 50 MCG/ACT nasal spray 1-2 spray (has no administration in time range)  atropine 1 % ophthalmic solution 1 drop (has no administration in time range)  brimonidine (ALPHAGAN) 0.2 % ophthalmic solution 1 drop (has no administration in time range)  dorzolamide-timolol (COSOPT) 22.3-6.8 MG/ML ophthalmic solution 1 drop (has no administration in time range)  latanoprost (XALATAN) 0.005 % ophthalmic solution 1 drop (has no administration in time range)  acetaminophen (TYLENOL) tablet 650 mg (has no administration in time range)    Or  acetaminophen (TYLENOL) suppository 650 mg (has no administration in time range)  ondansetron (ZOFRAN) tablet 4 mg (has no administration in time range)    Or  ondansetron (  ZOFRAN) injection 4 mg (has no administration in time range)  potassium chloride SA (K-DUR,KLOR-CON) CR tablet 60 mEq (has no administration in time range)  dexamethasone (DECADRON) injection 10 mg (10 mg Intravenous Given 03/13/18 1826)  prochlorperazine (COMPAZINE) injection 10 mg (10 mg Intravenous Given 03/13/18 1826)  diphenhydrAMINE  (BENADRYL) injection 25 mg (25 mg Intravenous Given 03/13/18 1826)  sodium chloride 0.9 % bolus 1,000 mL (0 mLs Intravenous Stopped 03/13/18 2126)  LORazepam (ATIVAN) injection 1 mg (1 mg Intravenous Given 03/13/18 2127)     Initial Impression / Assessment and Plan / ED Course  I have reviewed the triage vital signs and the nursing notes.  Pertinent labs & imaging results that were available during my care of the patient were reviewed by me and considered in my medical decision making (see chart for details).     Patient presenting with a right-sided sensory changes and facial droop.  Patient is afebrile, vital signs are stable.  She is nontoxic but very anxious in appearance.  Code stroke was called in the field with EMS and patient was evaluated by myself and Dr. Lorraine Lax with neurology.  Head CT showed no acute intracranial abnormality with a stable remote left frontal lobe infarct.  Given this and exam findings, code stroke was canceled and TPA was not given.  Lab work reviewed by me shows no leukocytosis, no anemia, no metabolic derangements.  No renal insufficiency.  UDS negative.   MRI shows a new 13 mm juxtacortical flair signal abnormality at the parasagittal left frontal lobe.  Differential considerations include demyelinating disease, inflammatory or infectious process, vasculitis, or neoplasm.  Patient received a migraine cocktail in the ED with significant improvement in her headache (down to 5/10 in severity) but reports that the numbness and tingling to the right side of her body is persistent.  I spoke with Dr. Leonel Ramsay who has seen and evaluated the patient.  He is concerned for possible seizure like activity contributing to patient's symptoms and recommends contrasted MRI with EEG in the morning.  Spoke with Dr. Starla Link with [facility] who agrees to assume care of patient and bring her into the hospital for further evaluation and management.   Final Clinical Impressions(s)  / ED Diagnoses   Final diagnoses:  Abnormal brain MRI  Right sided numbness    ED Discharge Orders    None       Debroah Baller 03/14/18 Orlando Penner, MD 03/14/18 1525

## 2018-03-13 NOTE — Consult Note (Addendum)
Requesting Physician: Dr. Tomi Bamberger    Chief Complaint: Right-sided facial numbness and arm numbness, headache  History obtained from: Patient and Chart    HPI:                                                                                                                                       Angel Rodriguez is an 43 y.o. 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  Apparent last known normal is 4 PM.  Patient states that she has been having chronic headache, however suddenly noticed that her right side of her face as well as right arm became numb/tingling.  EMS was called and they noticed patient was slow to respond and difficulty completing sentences.  They also felt patient had a mild right facial droop.  Blood pressure 782 systolic.   On arrival patient appeared anxious, however followed commands and answer questions appropriately.  She did not have a facial droop.  Continued to complain of numbness over the right side of her face as well as right arm complaining of severe headache.  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  Patient did not receive TPA due to mild/nondisabling symptoms.  Patient states that she has been having headaches for a long time and in the past has had some sensory symptoms.  Date last known well:  03/13/18  Time last known well: 4pm  tPA Given: no, symptoms too mild to treat  NIHSS: 3 Baseline MRS 0    Past Medical History:  Diagnosis Date  . Anxiety   . 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 Cluster headaches    started after seizure and migraines  . Complication of anesthesia    trouble with short term memory after surgeries  . Depression   . Fibroids    s/p TLH, bilateral salpingectomy  .  GERD (gastroesophageal reflux disease)    patient thinks due to medications  . History of anemia    prior to hysterectomy  . History of pneumonia   . History of trichomoniasis   . Seizures ([place]) x 1 2-3 yrs ago none since   once saw neurologist, full body brain MRI and another 6 months lster  . Status post right oophorectomy   . Thrombocytopenia ([place])    sees [place] with Heme Onc    Past Surgical History:  Procedure Laterality Date  . CYSTOSCOPY N/A 07/31/2017   Procedure: CYSTOSCOPY;  Surgeon: Megan Salon, MD;  Location: [facility];  Service: Gynecology;  Laterality: N/A;  . ENDOMETRIAL ABLATION    . EYE SURGERY     laser - removed blood from right eye  . HYSTEROSCOPY  W/D&C    . LAPAROSCOPY N/A 09/19/2017   Procedure: LAPAROSCOPY DIAGNOSTIC WITH RIGHT OOPHERECTOMY, LYSIS OF ADHESIONS;  Surgeon: Salvadore Dom, MD;  Location: [facility];  Service: Gynecology;  Laterality: N/A;  . OMENTECTOMY N/A 11/21/2017   Procedure: OMENTECTOMY AND PELVIC WASHINGS;  Surgeon: Isabel Caprice, MD;  Location: [facility];  Service: Gynecology;  Laterality: N/A;  . ROBOTIC ASSISTED SALPINGO OOPHERECTOMY Left 11/21/2017   Procedure: XI ROBOTIC ASSISTED LEFT SALPINGO OOPHORECTOMY;  Surgeon: Isabel Caprice, MD;  Location: [facility];  Service: Gynecology;  Laterality: Left;  . TOTAL LAPAROSCOPIC HYSTERECTOMY WITH SALPINGECTOMY Bilateral 07/31/2017   Procedure: TOTAL LAPAROSCOPIC HYSTERECTOMY WITH SALPINGECTOMY;  Surgeon: Megan Salon, MD;  Location: [facility];  Service: Gynecology;  Laterality: Bilateral;  20 week size uterus/ Alexis bag in room/ need 4.5 hours  . TUBAL LIGATION    . WISDOM TOOTH EXTRACTION      Family History  Problem Relation Age of Onset  . Alcoholism Father   . Cirrhosis Father   . Cancer Maternal Grandmother        unsure of type; died of other causes  . Heart disease Maternal Aunt    Social History:  reports that she has been smoking e-cigarettes and cigarettes. She has smoked for the  past 20.00 years. She has never used smokeless tobacco. She reports current alcohol use. She reports that she does not use drugs.  Allergies:  Allergies  Allergen Reactions  . Ivp Dye [Iodinated Diagnostic Agents] Hives, Itching and Swelling  . Bee Venom Swelling    Medications:                                                                                                                        I reviewed home medications   ROS:                                                                                                                                     14 systems reviewed and negative except above   Examination:  General: Appears well-developed  Psych: Affect appropriate to situation Eyes: No scleral injection HENT: No OP obstrucion Head: Normocephalic.  Cardiovascular: Normal rate and regular rhythm.  Respiratory: Effort normal and breath sounds normal to anterior ascultation GI: Soft.  No distension. There is no tenderness.  Skin: WDI    Neurological Examination Mental Status: Alert, anxious but oriented, thought content appropriate.  Speech fluent without evidence of aphasia. Able to follow 3 step commands without difficulty. Cranial Nerves: II: Visual fields grossly normal in left eye, right eye blind III,IV, VI: ptosis not present, extra-ocular motions intact bilaterally, pupils equal, round, reactive to light and accommodation V,VII: smile symmetric, facial light touch sensation normal bilaterally VIII: hearing normal bilaterally IX,X: uvula rises symmetrically XI: bilateral shoulder shrug XII: midline tongue extension Motor: Right : Upper extremity   5/5    Left:     Upper extremity   5/5  Lower extremity   5/5     Lower extremity   5/5 Tone and bulk:normal tone throughout; no atrophy noted Sensory: Reduced sensation over the right side of face splitting at  midline and right upper arm. Deep Tendon Reflexes: 2+ and symmetric throughout Plantars: Right: downgoing   Left: downgoing Cerebellar: normal finger-to-nose, normal rapid alternating movements and normal heel-to-shin test Gait: Not assessed     Lab Results: Basic Metabolic Panel: No results for input(s): NA, K, CL, CO2, GLUCOSE, BUN, CREATININE, CALCIUM, MG, PHOS in the last 168 hours.  CBC: No results for input(s): WBC, NEUTROABS, HGB, HCT, MCV, PLT in the last 168 hours.  Coagulation Studies: No results for input(s): LABPROT, INR in the last 72 hours.  Imaging: No results found.   ASSESSMENT AND PLAN  43 y.o. female last medical history of cluster headaches/migraine headaches, seizures, anxiety, thrombocytopenia, right vision loss and abnormal MRI Angel Rodriguez   presents to the ER as a code stroke with sudden onset right-sided numbness and headache while at work.  CT head showed no acute findings, redemonstrated old left frontal hypodensity on CT.  Patient did not receive TPA as symptoms were too mild to treat. MRI of the brain in 2017was essentially with 2 focus of T2/FLAIR hyperintense focus in the left frontal subcortical white matter.   Differential diagnosis includes complicated migraine, acute stroke  Recommendations   MRI Brain w/o contrast stat to rule out stroke Migraine cocktail    Will sign out to colleague Dr Leonel Ramsay.     [facility] Pager Number [phone]

## 2018-03-13 NOTE — H&P (Signed)
History and Physical    Angel Rodriguez EUM:[id number] DOB: 1975/11/06 DOA: 03/13/2018  PCP: Earlie Raveling, NP   Patient coming from: Home  I have personally briefly reviewed patient's old medical records in [place]  Chief Complaint: Right-sided facial numbness and numbness  HPI: Angel Rodriguez is a 43 y.o. female with medical history significant of cluster headache/migraine headaches, seizure (patient states that she had a seizure 3 years ago once and is not on any antiseizure medications), anxiety, thrombocytopenia, anxiety, depression, fibroids, GERD presented with acute onset of right-sided numbness and headache while at work around 4 PM today.  Patient has history of chronic headaches but today around 4 PM she noticed that her right side of her face as well as right arm had numbness/tingling.  She also felt that her right upper extremity was slightly weak.  EMS was called and they noticed that patient was slow to respond and had difficulty completing sentences and there might have been mild facial droop.  No loss of consciousness/photophobia/phonophobia.  No seizure-like activities.  No bowel bladder incontinence, no tongue bite.  No fever, nausea, vomiting, diarrhea, dysuria or abdominal pain.  Patient states that she has been in significant stress lately with her work and kids.  ED Course: Code stroke was called.  Neurology evaluated the patient.  CT of the head was negative for acute stroke.  MRI of the brain was done which did not show any stroke but showed new juxtacortical lesion in the left frontal lobe.  Neurology recommended admission under hospitalist service for further work-up including EEG and imaging studies.  Review of Systems: As per HPI otherwise all other 10 point systems were reviewed and were negative.   Past Medical History:  Diagnosis Date  . Anxiety   . 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 Cluster headaches    started after seizure and migraines  .  Complication of anesthesia    trouble with short term memory after surgeries  . Depression   . Fibroids    s/p TLH, bilateral salpingectomy  . GERD (gastroesophageal reflux disease)    patient thinks due to medications  . History of anemia    prior to hysterectomy  . History of pneumonia   . History of trichomoniasis   . Seizures ([place]) x 1 2-3 yrs ago none since   once saw neurologist, full body brain MRI and another 6 months lster  . Status post right oophorectomy   . Thrombocytopenia ([place])    sees [place] with Heme Onc    Past Surgical History:  Procedure Laterality Date  . CYSTOSCOPY N/A 07/31/2017   Procedure: CYSTOSCOPY;  Surgeon: Megan Salon, MD;  Location: [facility];  Service: Gynecology;  Laterality: N/A;  . ENDOMETRIAL ABLATION    . EYE SURGERY     laser - removed blood from right eye  . HYSTEROSCOPY W/D&C    . LAPAROSCOPY N/A 09/19/2017   Procedure: LAPAROSCOPY DIAGNOSTIC WITH RIGHT OOPHERECTOMY, LYSIS OF ADHESIONS;  Surgeon: Salvadore Dom, MD;  Location: [facility];  Service: Gynecology;  Laterality: N/A;  . OMENTECTOMY N/A 11/21/2017   Procedure: OMENTECTOMY AND PELVIC WASHINGS;  Surgeon: Isabel Caprice, MD;  Location: [facility];  Service: Gynecology;  Laterality: N/A;  . ROBOTIC ASSISTED SALPINGO OOPHERECTOMY Left 11/21/2017   Procedure: XI ROBOTIC ASSISTED LEFT SALPINGO OOPHORECTOMY;  Surgeon: Isabel Caprice, MD;  Location: [facility];  Service: Gynecology;  Laterality: Left;  . TOTAL LAPAROSCOPIC HYSTERECTOMY  WITH SALPINGECTOMY Bilateral 07/31/2017   Procedure: TOTAL LAPAROSCOPIC HYSTERECTOMY WITH SALPINGECTOMY;  Surgeon: Megan Salon, MD;  Location: [facility];  Service: Gynecology;  Laterality: Bilateral;  20 week size uterus/ Alexis bag in room/ need 4.5 hours  . TUBAL LIGATION    . WISDOM TOOTH EXTRACTION     Social history  reports that she has been smoking e-cigarettes and cigarettes. She has smoked for the past 20.00 years. She has never used smokeless tobacco. She  reports current alcohol use. She reports that she does not use drugs.  Lives at home with family.  Allergies  Allergen Reactions  . Ivp Dye [Iodinated Diagnostic Agents] Hives, Itching and Swelling  . Bee Venom Swelling    Family History  Problem Relation Age of Onset  . Alcoholism Father   . Cirrhosis Father   . Cancer Maternal Grandmother        unsure of type; died of other causes  . Heart disease Maternal Aunt     Prior to Admission medications   Medication Sig Start Date End Date Taking? Authorizing Provider  acetaminophen-codeine (TYLENOL #3) 300-30 MG tablet TAKE 1 TO 2 TABS BY MOUTH EVERY 4 HOURS AS NEEDED FOR MOD PAIN OR SEVERE PAIN (FOR POST OP PAIN) 11/28/17   Sater, Nanine Means, MD  atropine 1 % ophthalmic solution Place 1 drop into the right eye 3 (three) times daily.  01/10/16   [provider]  bisacodyl (DULCOLAX) 5 MG EC tablet Take 1 tablet (5 mg total) by mouth daily as needed for moderate constipation. 11/21/17   Everitt Amber, MD  brimonidine (ALPHAGAN P) 0.1 % SOLN Place 1 drop into the right eye 3 (three) times daily.  06/21/15   [provider]  calcium carbonate (TUMS EX) 750 MG chewable tablet Chew 2 tablets by mouth as needed for heartburn.    [provider]  clonazePAM (KLONOPIN) 0.5 MG tablet Take 1 tablet (0.5 mg total) by mouth 2 (two) times daily as needed. for anxiety 10/29/17   Sater, Nanine Means, MD  Cyanocobalamin (VITAMIN B-12 PO) Take 1 tablet by mouth daily.     [provider]  dorzolamide-timolol (COSOPT) 22.3-6.8 MG/ML ophthalmic solution Place 1 drop into the right eye 2 (two) times daily.  06/21/15   [provider]  FLUoxetine (PROZAC) 20 MG capsule Take 1 capsule (20 mg total) by mouth daily. Patient taking differently: Take 20 mg by mouth daily as needed (anxiety).  10/25/17   Sater, Nanine Means, MD  fluticasone (FLONASE) 50 MCG/ACT nasal spray Place 1-2 sprays into both nostrils daily as needed for allergies.      [provider]  latanoprost (XALATAN) 0.005 % ophthalmic solution Place 1 drop into both eyes at bedtime.    [provider]  Multiple Vitamin (MULTI-VITAMINS) TABS Take 1 tablet by mouth daily.     [provider]  SUMAtriptan (IMITREX) 100 MG tablet Take 1 tablet (100 mg total) by mouth once as needed for migraine. May repeat in 2 hours if headache persists or recurs. 04/20/17   Sater, Nanine Means, MD  verapamil (CALAN-SR) 240 MG CR tablet Take 1 tablet (240 mg total) by mouth at bedtime. Patient taking differently: Take 240 mg by mouth at bedtime. For migraines 10/30/17   Britt Bottom, MD    Physical Exam: Vitals:   03/13/18 1657 03/13/18 1658 03/13/18 1815  BP: 127/84  108/73  Pulse: 60  (!) 58  Resp: 15  17  Temp: 97.6  F (36.4 C)    SpO2: 98%  98%  Weight:  81.4 kg     Constitutional: NAD, looks anxious. Vitals:   03/13/18 1657 03/13/18 1658 03/13/18 1815  BP: 127/84  108/73  Pulse: 60  (!) 58  Resp: 15  17  Temp: 97.6 F (36.4 C)    SpO2: 98%  98%  Weight:  81.4 kg    Eyes: PERRL, lids and conjunctivae normal ENMT: Mucous membranes are moist. Posterior pharynx clear of any exudate or lesions. Neck: normal, supple, no masses, no thyromegaly Respiratory: bilateral decreased breath sounds at bases, no wheezing, no crackles. Normal respiratory effort. No accessory muscle use.  Cardiovascular: S1 S2 positive, rate controlled. No extremity edema. 2+ pedal pulses.  Abdomen: Obese, no tenderness, no masses palpated. No hepatosplenomegaly. Bowel sounds positive.  Musculoskeletal: no clubbing / cyanosis. No joint deformity upper and lower extremities.  Skin: no rashes, lesions, ulcers. No induration Neurologic: Alert awake oriented, CN 2-12 grossly intact. Moving extremities. No focal neurologic deficits.  Power is 5 out of 5 in all extremities Psychiatric: Looks anxious.  Judgment intact  Labs on Admission: I have personally reviewed following  labs and imaging studies  CBC: Recent Labs  Lab 03/13/18 1728 03/13/18 1811  WBC  --  6.6  HGB 13.6 13.6  HCT 40.0 41.1  MCV  --  89.2  PLT  --  59*   Basic Metabolic Panel: Recent Labs  Lab 03/13/18 1634 03/13/18 1728  NA 138 140  K 3.5 3.4*  CL 106 106  CO2 21*  --   GLUCOSE 99 103*  BUN 9 9  CREATININE 0.84 0.80  CALCIUM 10.4*  --    GFR: Estimated Creatinine Clearance: 85.7 mL/min (by C-G formula based on SCr of 0.8 mg/dL). Liver Function Tests: Recent Labs  Lab 03/13/18 1634  AST 23  ALT 24  ALKPHOS 55  BILITOT 0.7  PROT 7.6  ALBUMIN 4.2   No results for input(s): LIPASE, AMYLASE in the last 168 hours. No results for input(s): AMMONIA in the last 168 hours. Coagulation Profile: Recent Labs  Lab 03/13/18 1811  INR 0.95   Cardiac Enzymes: No results for input(s): CKTOTAL, CKMB, CKMBINDEX, TROPONINI in the last 168 hours. BNP (last 3 results) No results for input(s): PROBNP in the last 8760 hours. HbA1C: No results for input(s): HGBA1C in the last 72 hours. CBG: Recent Labs  Lab 03/13/18 1658  GLUCAP 103*   Lipid Profile: No results for input(s): CHOL, HDL, LDLCALC, TRIG, CHOLHDL, LDLDIRECT in the last 72 hours. Thyroid Function Tests: No results for input(s): TSH, T4TOTAL, FREET4, T3FREE, THYROIDAB in the last 72 hours. Anemia Panel: No results for input(s): VITAMINB12, FOLATE, FERRITIN, TIBC, IRON, RETICCTPCT in the last 72 hours. Urine analysis:    Component Value Date/Time   COLORURINE STRAW (A) 03/13/2018 1857   APPEARANCEUR CLEAR 03/13/2018 1857   LABSPEC 1.003 (L) 03/13/2018 1857   PHURINE 8.0 03/13/2018 1857   GLUCOSEU NEGATIVE 03/13/2018 1857   HGBUR NEGATIVE 03/13/2018 1857   BILIRUBINUR NEGATIVE 03/13/2018 1857   BILIRUBINUR N 05/04/2017 [place] 03/13/2018 1857   PROTEINUR NEGATIVE 03/13/2018 1857   UROBILINOGEN 0.2 05/04/2017 1327   UROBILINOGEN 0.2 09/24/2010 1229   NITRITE NEGATIVE 03/13/2018 1857    LEUKOCYTESUR NEGATIVE 03/13/2018 1857    Radiological Exams on Admission: Mr Brain Wo Contrast  Result Date: 03/13/2018 CLINICAL DATA:  Initial evaluation for acute right-sided numbness with headache. History of cluster headaches/migraine, seizure, prior right-sided  visual loss. EXAM: MRI HEAD WITHOUT CONTRAST TECHNIQUE: Multiplanar, multiecho pulse sequences of the brain and surrounding structures were obtained without intravenous contrast. COMPARISON:  Comparison made with prior CT from earlier the same day as well as previous MRIs from 02/14/2016 and 06/30/2015. FINDINGS: Brain: Cerebral volume stable, and within normal limits for age. Again seen are 2 juxta cortical T2/FLAIR hyperintensities involving the anterior left frontal lobe, previously seen on previous exam. The more posterior lesion seen on FLAIR sequence image 18 appears increased in size from previous, now measuring 2.2 cm in transverse diameter, previously 1.3 cm in diameter. The more anterior lesion is relatively stable in size and appearance. Adjacent T1 hypointense black hole noted adjacent to this lesion. There is a new 13 mm juxta cortical T2/FLAIR lesion at the parasagittal high left frontal lobe (series 11, image 21). No associated restricted diffusion. No other new lesions identified. No lesions within the right cerebral hemisphere or infratentorial brain or brainstem. No other abnormal foci of restricted diffusion to suggest acute or subacute ischemia. Gray-white matter differentiation otherwise maintained. No evidence for acute intracranial hemorrhage. Trace chronic hemosiderin staining noted about 1 of the chronic left frontal FLAIR signal abnormalities (series 12, image 40). No other evidence for acute or chronic intracranial hemorrhage. No other mass lesion, midline shift or mass effect. No hydrocephalus. No extra-axial fluid collection. Pituitary gland within normal limits. Midline structures intact and normal. Vascular: Major  intracranial vascular flow voids maintained. Skull and upper cervical spine: Craniocervical junction normal. Upper cervical spine within normal limits. No focal marrow replacing lesion. Scalp soft tissues unremarkable. Sinuses/Orbits: Globes and orbital soft tissues within normal limits. Small right maxillary sinus retention cyst. Mild scattered mucosal thickening within the ethmoidal air cells. No mastoid effusion. Other: None. IMPRESSION: 1. New 13 mm juxta cortical FLAIR signal abnormality at the parasagittal left frontal lobe as above. Previously identified two additional chronic juxta cortical FLAIR hyperintensities again seen, the more posterior of which is slightly more prominent as compared to previous exam. Findings are nonspecific, and could reflect the sequelae of underlying demyelinating disease. An infectious or inflammatory process, or changes related to vasculitis could also be considered. Subacute ischemia is also possible, but felt to be less likely given the patient's presenting symptoms. Tumor felt to be unlikely given the overall appearance and previously seen waxing and waning enhancement about the pre-existing left frontal lesions. Further assessment with postcontrast imaging could be performed for further evaluation as desired, as there is anticipated to be a small amount of enhancement about the new parasagittal lesion. 2. Otherwise stable normal MRI appearance of the brain. Electronically Signed   By: Jeannine Boga M.D.   On: 03/13/2018 22:40   Ct Head Code Stroke Wo Contrast  Result Date: 03/13/2018 CLINICAL DATA:  Code stroke. Acute onset of right-sided facial droop and weakness began 45 minutes ago. EXAM: CT HEAD WITHOUT CONTRAST TECHNIQUE: Contiguous axial images were obtained from the base of the skull through the vertex without intravenous contrast. COMPARISON:  MRI brain 02/14/2016 FINDINGS: Brain: Remote high left frontal lobe infarct is again seen on image 25. No acute  infarct, hemorrhage, or mass lesion is present. No significant white matter lesions are present. Basal ganglia are intact. Insular ribbon is normal The brainstem and cerebellum are within normal limits. The ventricles are of normal size. No significant extraaxial fluid collection is present. Vascular: No hyperdense vessel or unexpected calcification. Skull: Calvarium is intact. No focal lytic or blastic lesions are present. Sinuses/Orbits: A polyp  or mucous retention cyst is again noted in the posterior right maxillary sinus. The paranasal sinuses and mastoid air cells are otherwise clear. The globes and orbits are within normal limits. ASPECTS [facility] Stroke Program Early CT Score) - Ganglionic level infarction (caudate, lentiform nuclei, internal capsule, insula, M1-M3 cortex): 7/7 - Supraganglionic infarction (M4-M6 cortex): 3/3 Total score (0-10 with 10 being normal): 10/10 IMPRESSION: 1. No acute intracranial abnormality. 2. Stable remote left frontal lobe infarct. 3. ASPECTS is 10/10 The above was relayed via text pager to Dr. Lorraine Lax on 03/13/2018 at 16:54 . Electronically Signed   By: San Morelle M.D.   On: 03/13/2018 16:55     Assessment/Plan Active Problems:   Right sided weakness   Right-sided weakness and numbness with concern for seizure -Questionable cause.  CT head and MRI of the brain were negative for acute stroke.  MRI of the brain showed new juxtacortical lesion in the left frontal lobe.  Neurology has evaluated the patient and is recommending EEG and starting Keppra following this.  I have ordered EEG.  Will let neurology decide about further imaging studies. -Fall precautions. -PT/SLP eval -Monitor mental status  Chronic thrombocytopenia -Questionable cause.  No signs of bleeding.  Monitor.  Outpatient hematology evaluation  Anxiety/depression -Continue fluoxetine and as needed clonazepam  Cluster headache/migraine headaches -Continue Imitrex as needed  DVT  prophylaxis: SCDs.  Avoid Lovenox because of thrombocytopenia Code Status: Full Family Communication: Mother at bedside Disposition Plan: Home in 1 to 2 days once clinically improved and cleared by neurology Consults called: Neurology was called by ED provider Admission status: Observation/MedSurg  Severity of Illness: The appropriate patient status for this patient is OBSERVATION. Observation status is judged to be reasonable and necessary in order to provide the required intensity of service to ensure the patient's safety. The patient's presenting symptoms, physical exam findings, and initial radiographic and laboratory data in the context of their medical condition is felt to place them at decreased risk for further clinical deterioration. Furthermore, it is anticipated that the patient will be medically stable for discharge from the hospital within 2 midnights of admission. The following factors support the patient status of observation.   " The patient's presenting symptoms include right-sided numbness. " The physical exam findings include anxious/subjective right sided weakness. " The initial radiographic and laboratory data are MRI of the brain showednew juxtacortical lesion in the left frontal lobe.Aline August MD [facility] Pager [phone]  If 7PM-7AM, please contact night-coverage [email or web address] Password [facility]  03/13/2018, 11:54 PM

## 2018-03-14 ENCOUNTER — Observation Stay (HOSPITAL_COMMUNITY): Payer: PRIVATE HEALTH INSURANCE

## 2018-03-14 ENCOUNTER — Other Ambulatory Visit: Payer: Self-pay

## 2018-03-14 ENCOUNTER — Ambulatory Visit (HOSPITAL_BASED_OUTPATIENT_CLINIC_OR_DEPARTMENT_OTHER): Payer: PRIVATE HEALTH INSURANCE

## 2018-03-14 ENCOUNTER — Encounter (HOSPITAL_COMMUNITY): Payer: Self-pay

## 2018-03-14 DIAGNOSIS — R471 Dysarthria and anarthria: Secondary | ICD-10-CM | POA: Diagnosis present

## 2018-03-14 DIAGNOSIS — R531 Weakness: Secondary | ICD-10-CM | POA: Diagnosis not present

## 2018-03-14 DIAGNOSIS — G939 Disorder of brain, unspecified: Secondary | ICD-10-CM

## 2018-03-14 DIAGNOSIS — F419 Anxiety disorder, unspecified: Secondary | ICD-10-CM | POA: Diagnosis not present

## 2018-03-14 DIAGNOSIS — R200 Anesthesia of skin: Secondary | ICD-10-CM | POA: Diagnosis not present

## 2018-03-14 DIAGNOSIS — G43711 Chronic migraine without aura, intractable, with status migrainosus: Secondary | ICD-10-CM | POA: Diagnosis not present

## 2018-03-14 DIAGNOSIS — D696 Thrombocytopenia, unspecified: Secondary | ICD-10-CM | POA: Diagnosis present

## 2018-03-14 LAB — COMPREHENSIVE METABOLIC PANEL
ALT: 26 U/L (ref 0–44)
AST: 28 U/L (ref 15–41)
Albumin: 4 g/dL (ref 3.5–5.0)
Alkaline Phosphatase: 53 U/L (ref 38–126)
Anion gap: 12 (ref 5–15)
BUN: 7 mg/dL (ref 6–20)
CO2: 17 mmol/L — ABNORMAL LOW (ref 22–32)
Calcium: 10 mg/dL (ref 8.9–10.3)
Chloride: 107 mmol/L (ref 98–111)
Creatinine, Ser: 0.86 mg/dL (ref 0.44–1.00)
GFR calc Af Amer: >60 mL/min (ref >60)
GFR calc non Af Amer: >60 mL/min (ref >60)
GLUCOSE: 194 mg/dL — AB (ref 70–99)
Potassium: 3.9 mmol/L (ref 3.5–5.1)
Sodium: 136 mmol/L (ref 135–145)
Total Bilirubin: 0.5 mg/dL (ref 0.3–1.2)
Total Protein: 7.5 g/dL (ref 6.5–8.1)

## 2018-03-14 LAB — HEMOGLOBIN A1C
Hgb A1c MFr Bld: 5.4 % (ref 4.8–5.6)
Mean Plasma Glucose: 108.28 mg/dL

## 2018-03-14 LAB — CBC
HCT: 40 % (ref 36.0–46.0)
Hemoglobin: 13.3 g/dL (ref 12.0–15.0)
MCH: 29.6 pg (ref 26.0–34.0)
MCHC: 33.3 g/dL (ref 30.0–36.0)
MCV: 89.1 fL (ref 80.0–100.0)
Platelets: 67 10*3/uL — ABNORMAL LOW (ref 150–400)
RBC: 4.49 MIL/uL (ref 3.87–5.11)
RDW: 12.2 % (ref 11.5–15.5)
WBC: 8.4 10*3/uL (ref 4.0–10.5)
nRBC: 0 % (ref 0.0–0.2)

## 2018-03-14 LAB — LIPID PANEL
CHOL/HDL RATIO: 5.9 ratio
Cholesterol: 319 mg/dL — ABNORMAL HIGH (ref 0–200)
HDL: 54 mg/dL (ref >40)
LDL Cholesterol: 207 mg/dL — ABNORMAL HIGH (ref 0–99)
Triglycerides: 289 mg/dL — ABNORMAL HIGH (ref <150)
VLDL: 58 mg/dL — ABNORMAL HIGH (ref 0–40)

## 2018-03-14 IMAGING — MR MR THORACIC SPINE WO/W CM
13 of 20 series · 19 of 48 positions shown · IV contrast (gadavist)
Comparison: Prior brain MRI from [DATE].

CLINICAL DATA: Initial evaluation for right-sided numbness,
tingling. History of cluster headaches, migraine, and seizure.

EXAM:
MRI HEAD WITH CONTRAST
MRA HEAD WITHOUT CONTRAST
MRI CERVICAL SPINE WITHOUT AND WITH CONTRAST
MRI THORACIC SPINE WITHOUT AND WITH CONTRAST
TECHNIQUE: Multiplanar, multiecho pulse sequences of the brain and surrounding
structures were obtained with intravenous contrast. Angiographic
images of the head were obtained using MRA technique with contrast.
Multiplanar, multiecho pulse sequences of the cervical and thoracic
spine were obtained without and with intravenous contrast.
CONTRAST:  8 cc of Gadavist.

[Series 20: T2 · sagittal · 3.0mm · 0.69mm/px · 1 of 15 slices shown (1 of 4)]
[im 1/15]
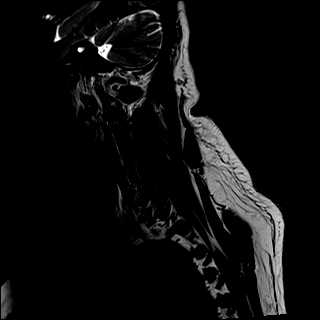

[Series 21: T1 · sagittal · 3.0mm · 0.69mm/px · 1 of 15 slices shown (1 of 5)]
[im 1/15]
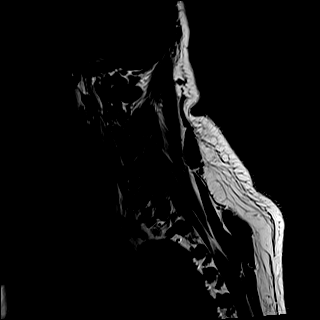

[Series 22: STIR · sagittal · 3.0mm · 0.86mm/px · 1 of 15 slices shown (1 of 2)]
[im 1/15]
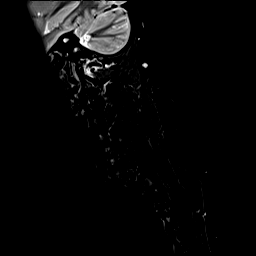

[Series 23: T2 · axial · 3.0mm · 0.66mm/px · z∈[-182,-105]mm · 2 of 26 slices shown (2 of 4)]
[im 1/26]
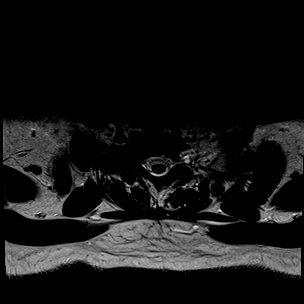
[im 26/26]
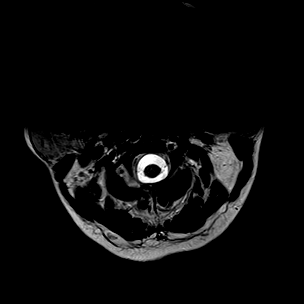

[Series 25: T1 · axial · 3.0mm · 0.39mm/px · z∈[-182,-105]mm · 2 of 26 slices shown (2 of 5)]
[im 1/26]
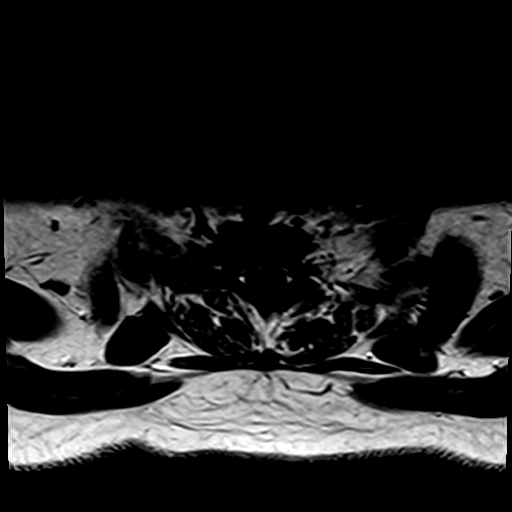
[im 26/26]
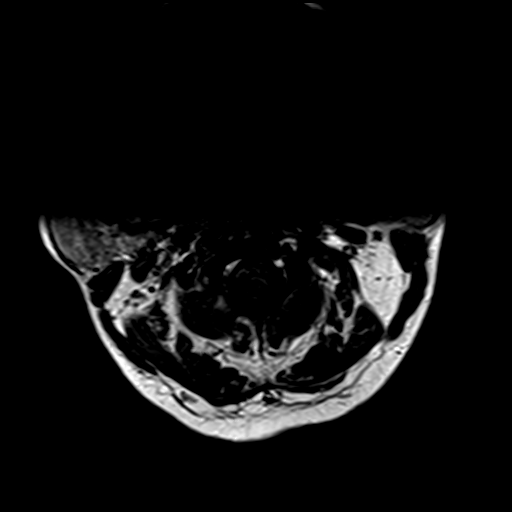

[Series 28: T1 · sagittal · 6.0mm · 1.23mm/px · 1 of 9 slices shown (3 of 5)]
[im 1/9]
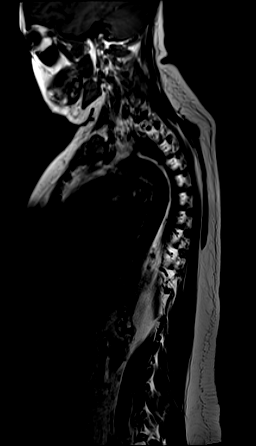

[Series 29: T2 · sagittal · 3.0mm · 0.76mm/px · 1 of 17 slices shown (3 of 4)]
[im 1/17]
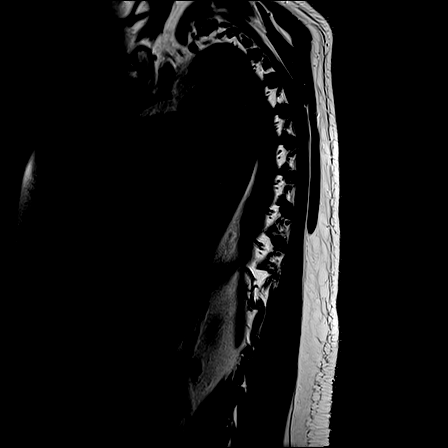

[Series 30: T1 · sagittal · 3.0mm · 0.76mm/px · 1 of 17 slices shown (4 of 5)]
[im 1/17]
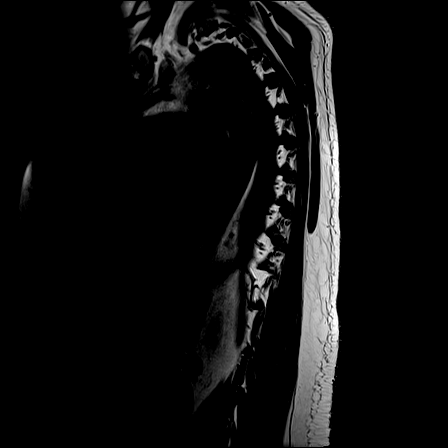

[Series 31: STIR · sagittal · 3.0mm · 0.38mm/px · 1 of 17 slices shown (2 of 2)]
[im 1/17]
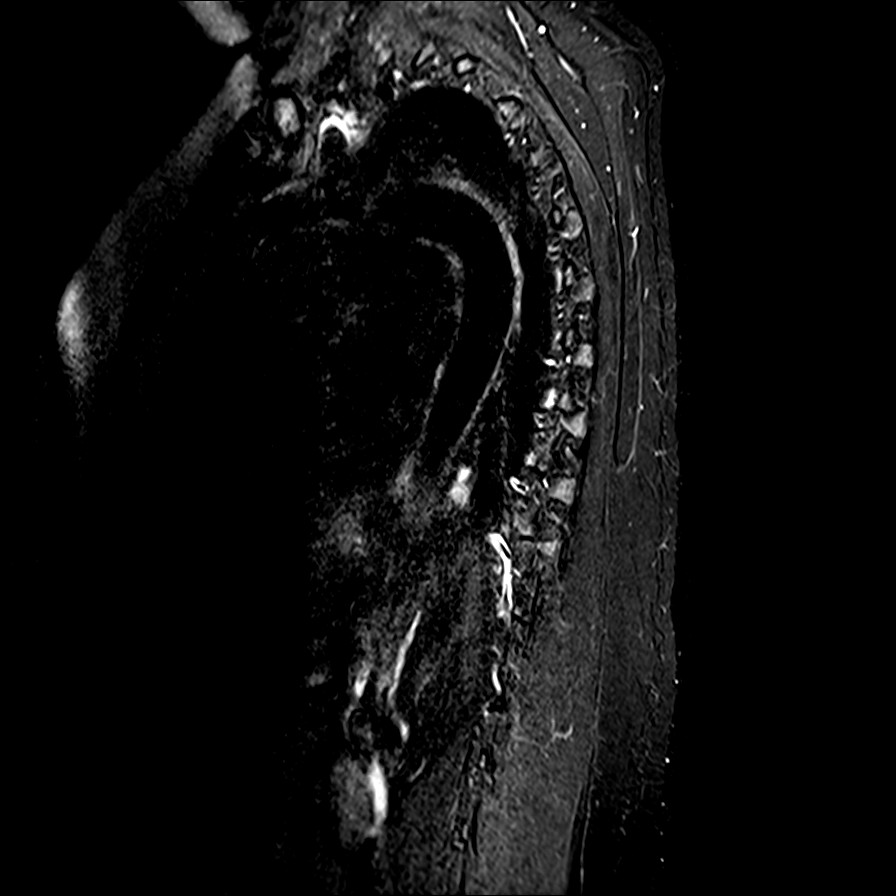

[Series 32: T2 · axial · 4.0mm · 0.59mm/px · z∈[-369,-188]mm · 3 of 39 slices shown (4 of 4)]
[im 1/39]
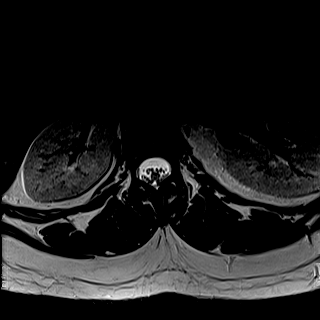
[im 20/39]
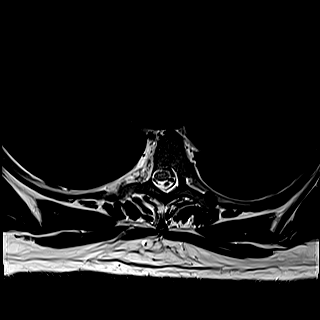
[im 39/39]
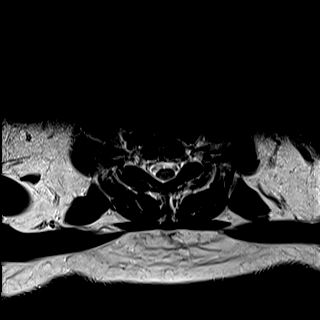

[Series 34: T1 · axial · non-contrast · 4.0mm · 0.31mm/px · z∈[-369,-188]mm · 3 of 39 slices shown (5 of 5)]
[im 1/39]
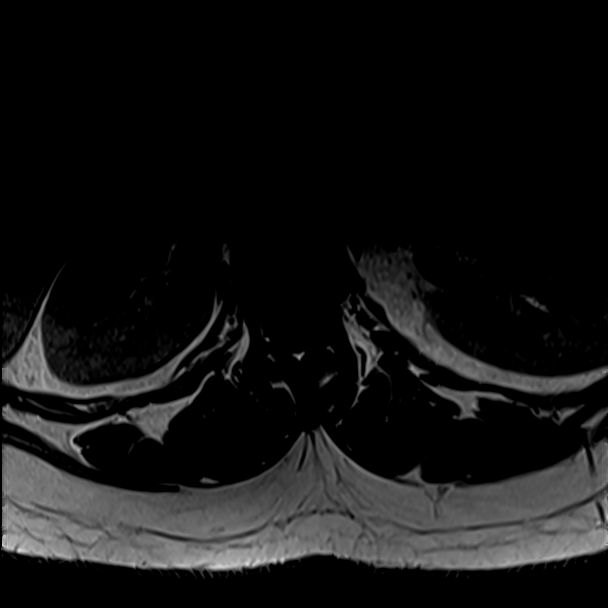
[im 20/39]
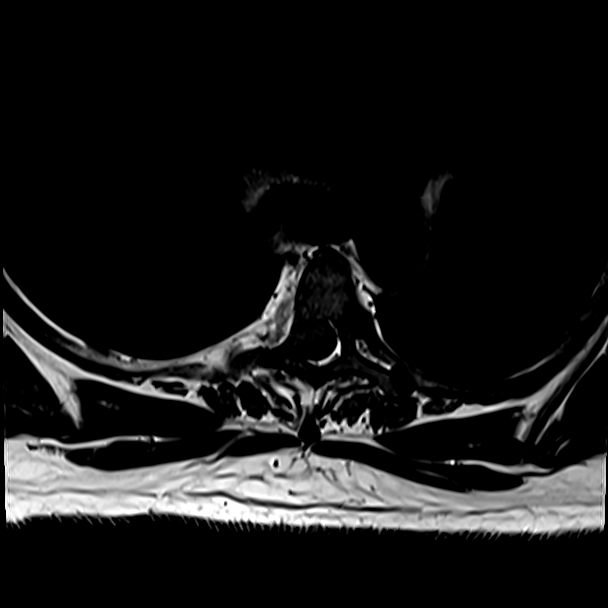
[im 39/39]
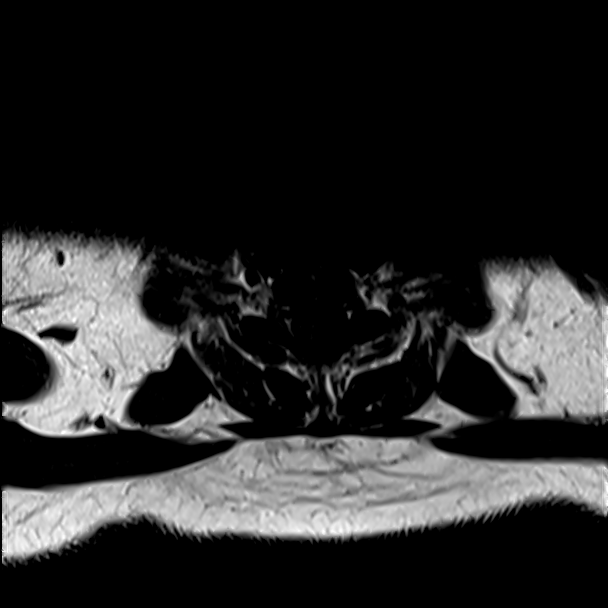

[Series 36: T1 fat-sat post-contrast · sagittal · 3.0mm · 0.76mm/px · 1 of 17 slices shown (1 of 2)]
[im 1/17]
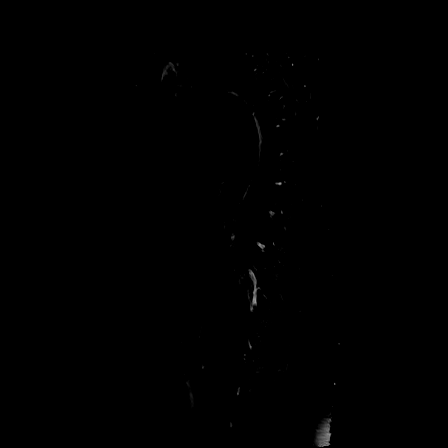

[Series 37: T1 fat-sat post-contrast · sagittal · 3.0mm · 0.43mm/px · 1 of 15 slices shown (2 of 2)]
[im 1/15]
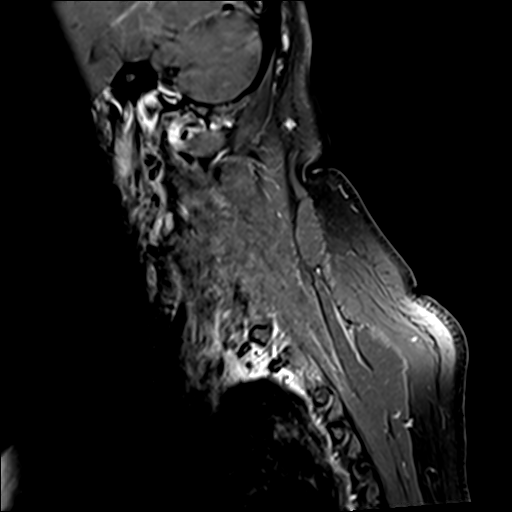

[19 of 48 positions shown; findings below may reference images not displayed]

FINDINGS: MRI HEAD FINDINGS

Postcontrast imaging through the brain demonstrates patchy
enhancement about the previously identified T2/FLAIR signal
abnormalities at the left frontal lobe, including the new
parasagittal lesion (series 39, images 39, 45, 46). No other
abnormal enhancement elsewhere within the brain.

MRA HEAD FINDINGS

ANTERIOR CIRCULATION:

Distal cervical segments of the internal carotid arteries are widely
patent with symmetric antegrade flow. Petrous, cavernous, and
supraclinoid segments patent without flow-limiting stenosis. A1
segments widely patent. Normal anterior communicating artery.
Anterior cerebral arteries well perfused to their distal aspects
without stenosis. No M1 stenosis or occlusion. Distal MCA branches
well perfused and symmetric. No significant irregularity or beading
to suggest vasculitis.

POSTERIOR CIRCULATION:

Vertebral arteries widely patent to the vertebrobasilar junction
without stenosis. Right vertebral artery slightly dominant.
Posterior inferior cerebral arteries patent bilaterally. Basilar
widely patent to its distal aspect without stenosis. Superior
cerebral arteries patent bilaterally. Both posterior cerebral
arteries well perfused to their distal aspects without stenosis.
Small bilateral posterior communicating arteries noted.

No intracranial aneurysm or other abnormality.

MRI CERVICAL SPINE FINDINGS

Straightening with slight reversal of the normal cervical lordosis.
No listhesis or subluxation.

Vertebral body heights well maintained without evidence for acute or
chronic fracture. Bone marrow signal intensity somewhat diffusely
decreased on T1 weighted imaging, most commonly related to anemia,
smoking, or obesity. No discrete or worrisome osseous lesions. No
abnormal marrow edema or enhancement.

Signal intensity within the cervical spinal cord is normal. No cord
signal abnormality or abnormal enhancement. Normal cord caliber and
morphology.

Craniocervical junction within normal limits. Paraspinous and
prevertebral soft tissues unremarkable. Normal intravascular flow
voids seen within the vertebral arteries bilaterally.

C2-C3: Unremarkable.

C3-C4:  Unremarkable.

C4-C5: Diffuse circumferential disc bulge with mild intervertebral
disc space narrowing. Flattening of the ventral CSF without
significant spinal stenosis or cord deformity. Foramina remain
patent.

C5-C6: Mild circumferential disc osteophyte complex with
intervertebral disc space narrowing. Flattening of the ventral
thecal sac without significant spinal stenosis. Mild bilateral C6
foraminal narrowing.

C6-C7: Mild circumferential disc osteophyte complex, slightly
eccentric to the left. Flattening of the ventral thecal sac without
significant spinal stenosis. Mild left C7 foraminal narrowing.

C7-T1:  Unremarkable.

MRI THORACIC SPINE FINDINGS

Mild levoscoliosis with accentuation of the normal thoracic
kyphosis. Alignment otherwise normal. No listhesis or subluxation.

Vertebral body heights maintained without evidence for acute or
chronic fracture. Bone marrow signal intensity somewhat diffusely
decreased on T1 weighted imaging, most commonly related to anemia,
smoking or obesity. Mild reactive endplate changes present about the
T6-7 interspace anteriorly. No discrete or worrisome osseous
lesions. No other abnormal marrow edema or enhancement.

Signal intensity within the thoracic spinal cord is normal. Normal
cord caliber and morphology. No cord signal abnormality or abnormal
enhancement. Conus medullaris terminates at the L1 level.

Paraspinous soft tissues within normal limits. Visualized lungs are
clear. Visualized visceral structures unremarkable.

No other significant degenerative changes seen within the thoracic
spine. No significant disc bulge or disc protrusion. No canal or
foraminal stenosis.
IMPRESSION: MRI HEAD IMPRESSION

1. Previously identified T2/FLAIR signal abnormalities within the
left frontal lobe demonstrate patchy post-contrast enhancement.
Differential as previously described.
2. No other abnormal enhancement elsewhere within the brain.

MRA HEAD IMPRESSION

Normal intracranial MRA. No large vessel occlusion or
hemodynamically significant stenosis. No significant vascular
irregularity or beading to suggest vasculitis.

MRI CERVICAL SPINE IMPRESSION

1. Normal MRI appearance of the cervical spinal cord. No cord signal
abnormality or abnormal enhancement.
2. Mild cervical spondylolysis at C4-5 through C6-7 without
significant spinal stenosis. Mild bilateral C6 and left C7 foraminal
narrowing.

MRI THORACIC SPINE IMPRESSION

1. Normal MRI appearance of the thoracic spinal cord. No cord signal
abnormality or abnormal enhancement.
2. Levoscoliosis without significant degenerative spondylolysis for
age. No stenosis.

## 2018-03-14 MED ORDER — GADOBUTROL 1 MMOL/ML IV SOLN
8.0000 mL | Freq: Once | INTRAVENOUS | Status: AC | PRN
  Administered 2018-03-14: 8 mL via INTRAVENOUS

## 2018-03-14 MED ORDER — CLONAZEPAM 0.5 MG PO TABS
0.5000 mg | ORAL_TABLET | Freq: Three times a day (TID) | ORAL | Status: DC | PRN
  Administered 2018-03-15 (×2): 0.5 mg via ORAL
  Filled 2018-03-14 (×3): qty 1

## 2018-03-14 MED ORDER — LIDOCAINE HCL 1 % IJ SOLN
10.0000 mL | Freq: Once | INTRAMUSCULAR | Status: DC
  Filled 2018-03-14: qty 10

## 2018-03-14 MED ORDER — LORAZEPAM 2 MG/ML IJ SOLN
1.0000 mg | Freq: Once | INTRAMUSCULAR | Status: AC
  Administered 2018-03-14: 1 mg via INTRAVENOUS
  Filled 2018-03-14: qty 1

## 2018-03-14 MED ORDER — PROCHLORPERAZINE EDISYLATE 10 MG/2ML IJ SOLN: 10.0000 mg | Freq: Once | INTRAMUSCULAR | Status: DC | PRN

## 2018-03-14 NOTE — Progress Notes (Signed)
Provider paged.Pt requesting to take home med verapamil 240mg  CR tablet and requesting to take clonazepam PRN more than twice daily which is amount prescribed.

## 2018-03-14 NOTE — ED Notes (Signed)
Py still in mri

## 2018-03-14 NOTE — Progress Notes (Signed)
Reason for consult: Headache, right side numbness  Subjective:  Patient continues to have right-sided numbness.  He also is complaining of a headache but less in intensity approximately 5/10.  She is very anxious and would like to know what is going on. Lumbar puncture was attempted however unsuccessful.  ROS: negative except above Examination  Vital signs in last 24 hours: Temp:  [97.6 F (36.4 C)-98.4 F (36.9 C)] 97.8 F (36.6 C) (01/09 1408) Pulse Rate:  [58-97] 84 (01/09 1408) Resp:  [15-24] 17 (01/09 1408) BP: (95-127)/(58-92) 108/73 (01/09 1408) SpO2:  [95 %-98 %] 95 % (01/09 1408) Weight:  [78.1 kg-81.4 kg] 78.1 kg (01/09 0517)  General: lying in bed, obese CVS: pulse-normal rate and rhythm RS: breathing comfortably Extremities: normal   Neuro: MS: Alert, oriented, follows commands CN: pupils equal and reactive,  EOMI, face symmetric, tongue midline, normal sensation over face.  Blind in the right eye, visual fields intact in left eye Motor: 5/5 strength in all 4 extremities Reflexes: 2+ bilaterally over patella, biceps, plantars: flexor Coordination: normal Sensory: Normal reduced sensation of the right face and arm Gait: not tested  Basic Metabolic Panel: Recent Labs  Lab 03/13/18 1634 03/13/18 1728 03/14/18 0316  NA 138 140 136  K 3.5 3.4* 3.9  CL 106 106 107  CO2 21*  --  17*  GLUCOSE 99 103* 194*  BUN 9 9 7   CREATININE 0.84 0.80 0.86  CALCIUM 10.4*  --  10.0    CBC: Recent Labs  Lab 03/13/18 1728 03/13/18 1811 03/14/18 0316  WBC  --  6.6 8.4  HGB 13.6 13.6 13.3  HCT 40.0 41.1 40.0  MCV  --  89.2 89.1  PLT  --  59* 67*     Coagulation Studies: Recent Labs    03/13/18 1811  LABPROT 12.6  INR 0.95    Imaging Reviewed:  MRI Brain w/ contrast confirms enhancement of the previous abnormalities as well as enhancement in the parasagittal lesion. 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 MRI brain contrast-enhancing lesions in the left frontal lobe with new parasagittal lesion.  2017- p-ANCA/c-ANCA/myeloperoxidase antibodies and ANA is negative.    Differentials at this point remains broad but should include demyelinating disease, vasculitis, inflammatory CNS conditions such as sarcoid.   Plan Obtain LP under fluoroscopy tomorrow to look for CSF protein, oligoclonal bands. EEG was obtained today was normal. Continue treat migraine headaches symptomatically. Hold antiplatelets due to thrombocytopenia.    Karena Addison Aroor [facility] Pager Number [phone] For questions after 7pm please refer to AMION to reach the Neurologist on call

## 2018-03-14 NOTE — ED Notes (Signed)
To mri 

## 2018-03-14 NOTE — Progress Notes (Signed)
EEG completed, results pending. 

## 2018-03-14 NOTE — Progress Notes (Signed)
Provider called called back, stated patient can have an additional dose of clonazepam but to not exceed a maximum of three a day. Given order to continue to hold verapamil.

## 2018-03-14 NOTE — Procedures (Signed)
ELECTROENCEPHALOGRAM REPORT   Patient: Angel Rodriguez       Room #: 9Y80X EEG No. ID: [id number] Age: 43 y.o.        Sex: female Referring Physician: Tamala Julian Report Date:  03/14/2018        Interpreting Physician: Alexis Goodell  History: [name] is an 43 y.o. female with right sided weakness  Medications:  Cosopt, Xalatan  Conditions of Recording:  This is a 21 channel routine scalp EEG performed with bipolar and monopolar montages arranged in accordance to the international 10/20 system of electrode placement. One channel was dedicated to EKG recording.  The patient is in the awake, drowsy and asleep states.  Description:  The waking background activity consists of a low voltage, symmetrical, fairly well organized, 11 Hz alpha activity, seen from the parieto-occipital and posterior temporal regions.  Low voltage fast activity, poorly organized, is seen anteriorly and is at times superimposed on more posterior regions.  A mixture of theta and alpha rhythms are seen from the central and temporal regions. The patient drowses with slowing to irregular, low voltage theta and beta activity.   The patient goes in to a light sleep with symmetrical sleep spindles, vertex central sharp transients and irregular slow activity.   No epileptiform activity is noted.   Hyperventilation and intermittent photic stimulation were not performed.  IMPRESSION: Normal electroencephalogram, awake and asleep. There are no focal lateralizing or epileptiform features.   Alexis Goodell, MD Neurology [phone] 03/14/2018, 10:55 AM

## 2018-03-14 NOTE — ED Notes (Signed)
Pt remains in mri.

## 2018-03-14 NOTE — ED Notes (Signed)
Bed assigned still in mri

## 2018-03-14 NOTE — ED Notes (Signed)
Mri called  The pt is  Getting another mri  Pt needs med  Call placed to admitting doctor

## 2018-03-14 NOTE — ED Notes (Signed)
Rep;ort given to rn on 5c 

## 2018-03-14 NOTE — Procedures (Signed)
Attempted LP.  Aborted after 1 attempt, patient anxious. No complications.   Will request LP under fluoroscopy

## 2018-03-14 NOTE — Progress Notes (Signed)
Carotid artery duplex has been completed. Preliminary results can be found in CV Proc through chart review.   03/14/18 12:13 PM Carlos Levering RVT

## 2018-03-14 NOTE — Progress Notes (Signed)
[facility] PROGRESS NOTE  Angel Rodriguez HUD:[id number] DOB: [birth year] DOA: 03/13/2018 PCP: Earlie Raveling, NP  Assessment/Plan:  Right-sided weakness and numbness with concern for seizure. Symptoms improved this am. EEG done pending results. CT head and MRI of the brain were negative for acute stroke.  MRI of the brain showed new juxtacortical lesion in the left frontal lobe.  Neurology has evaluated the patient and is recommending EEG and starting Keppra following this. Neuro recommending doppler as well. -Fall precautions. -PT/SLP eval -Monitor mental status -await further recommendations neuro  Brain lesion: per mri -await neurology recommendations  Chronic thrombocytopenia. stable -Questionable cause.  No signs of bleeding.  Monitor.  Outpatient hematology evaluation  Anxiety/depression -Continue fluoxetine and as needed clonazepam  Cluster headache/migraine headaches. Requesting tylenol with codiene -Continue Imitrex as needed  Code Status: full Family Communication: none present Disposition Plan: home hopefully tomorrow   Consultants:  Leonel Ramsay neurology  Procedures:  eeg  Antibiotics:    HPI/Subjective: Patient admitted dysarthria and right sided facial numbness. Stroke rule out. MRI reveal brain lesion. Concerns for seizure as hx of same. Neuro on board.   Patient reports headache this am and requests tylenol with codiene  Objective: Vitals:   03/14/18 0245 03/14/18 0517  BP: (!) 95/58 109/70  Pulse: 97 95  Resp: (!) 23 19  Temp:  98.4 F (36.9 C)  SpO2: 96% 95%    Intake/Output Summary (Last 24 hours) at 03/14/2018 1048 Last data filed at 03/13/2018 2126 Gross per 24 hour  Intake 1000 ml  Output 800 ml  Net 200 ml   Filed Weights   03/13/18 1658 03/14/18 0517  Weight: 81.4 kg 78.1 kg    Exam:   General:  Obese lying in bed eyes closed aroused to light touch  Cardiovascular: rrr no mgr no LE edema  Respiratory: normal effort  BS coarse with good air flow no wheeze or crackles  Abdomen: obese soft +BS no guarding or rebounding   Musculoskeletal: joints without swelling/erythema   Data Reviewed: Basic Metabolic Panel: Recent Labs  Lab 03/13/18 1634 03/13/18 1728 03/14/18 0316  NA 138 140 136  K 3.5 3.4* 3.9  CL 106 106 107  CO2 21*  --  17*  GLUCOSE 99 103* 194*  BUN 9 9 7   CREATININE 0.84 0.80 0.86  CALCIUM 10.4*  --  10.0   Liver Function Tests: Recent Labs  Lab 03/13/18 1634 03/14/18 0316  AST 23 28  ALT 24 26  ALKPHOS 55 53  BILITOT 0.7 0.5  PROT 7.6 7.5  ALBUMIN 4.2 4.0   No results for input(s): LIPASE, AMYLASE in the last 168 hours. No results for input(s): AMMONIA in the last 168 hours. CBC: Recent Labs  Lab 03/13/18 1728 03/13/18 1811 03/14/18 0316  WBC  --  6.6 8.4  HGB 13.6 13.6 13.3  HCT 40.0 41.1 40.0  MCV  --  89.2 89.1  PLT  --  59* 67*   Cardiac Enzymes: No results for input(s): CKTOTAL, CKMB, CKMBINDEX, TROPONINI in the last 168 hours. BNP (last 3 results) No results for input(s): BNP in the last 8760 hours.  ProBNP (last 3 results) No results for input(s): PROBNP in the last 8760 hours.  CBG: Recent Labs  Lab 03/13/18 1658  GLUCAP 103*    No results found for this or any previous visit (from the past 240 hour(s)).   Studies: Mr Virgel Paling [facility] Contrast  Result Date: 03/14/2018 CLINICAL DATA:  Initial evaluation for right-sided  numbness, tingling. History of cluster headaches, migraine, and seizure. EXAM: MRI HEAD WITH CONTRAST MRA HEAD WITHOUT CONTRAST MRI CERVICAL SPINE WITHOUT AND WITH CONTRAST MRI THORACIC SPINE WITHOUT AND WITH CONTRAST TECHNIQUE: Multiplanar, multiecho pulse sequences of the brain and surrounding structures were obtained with intravenous contrast. Angiographic images of the head were obtained using MRA technique with contrast. Multiplanar, multiecho pulse sequences of the cervical and thoracic spine were obtained without and with  intravenous contrast. CONTRAST:  8 cc of Gadavist. COMPARISON:  Prior brain MRI from 03/13/2017. FINDINGS: MRI HEAD FINDINGS Postcontrast imaging through the brain demonstrates patchy enhancement about the previously identified T2/FLAIR signal abnormalities at the left frontal lobe, including the new parasagittal lesion (series 39, images 39, 45, 46). No other abnormal enhancement elsewhere within the brain. MRA HEAD FINDINGS ANTERIOR CIRCULATION: Distal cervical segments of the internal carotid arteries are widely patent with symmetric antegrade flow. Petrous, cavernous, and supraclinoid segments patent without flow-limiting stenosis. A1 segments widely patent. Normal anterior communicating artery. Anterior cerebral arteries well perfused to their distal aspects without stenosis. No M1 stenosis or occlusion. Distal MCA branches well perfused and symmetric. No significant irregularity or beading to suggest vasculitis. POSTERIOR CIRCULATION: Vertebral arteries widely patent to the vertebrobasilar junction without stenosis. Right vertebral artery slightly dominant. Posterior inferior cerebral arteries patent bilaterally. Basilar widely patent to its distal aspect without stenosis. Superior cerebral arteries patent bilaterally. Both posterior cerebral arteries well perfused to their distal aspects without stenosis. Small bilateral posterior communicating arteries noted. No intracranial aneurysm or other abnormality. MRI CERVICAL SPINE FINDINGS Straightening with slight reversal of the normal cervical lordosis. No listhesis or subluxation. Vertebral body heights well maintained without evidence for acute or chronic fracture. Bone marrow signal intensity somewhat diffusely decreased on T1 weighted imaging, most commonly related to anemia, smoking, or obesity. No discrete or worrisome osseous lesions. No abnormal marrow edema or enhancement. Signal intensity within the cervical spinal cord is normal. No cord signal  abnormality or abnormal enhancement. Normal cord caliber and morphology. Craniocervical junction within normal limits. Paraspinous and prevertebral soft tissues unremarkable. Normal intravascular flow voids seen within the vertebral arteries bilaterally. C2-C3: Unremarkable. C3-C4:  Unremarkable. C4-C5: Diffuse circumferential disc bulge with mild intervertebral disc space narrowing. Flattening of the ventral CSF without significant spinal stenosis or cord deformity. Foramina remain patent. C5-C6: Mild circumferential disc osteophyte complex with intervertebral disc space narrowing. Flattening of the ventral thecal sac without significant spinal stenosis. Mild bilateral C6 foraminal narrowing. C6-C7: Mild circumferential disc osteophyte complex, slightly eccentric to the left. Flattening of the ventral thecal sac without significant spinal stenosis. Mild left C7 foraminal narrowing. C7-T1:  Unremarkable. MRI THORACIC SPINE FINDINGS Mild levoscoliosis with accentuation of the normal thoracic kyphosis. Alignment otherwise normal. No listhesis or subluxation. Vertebral body heights maintained without evidence for acute or chronic fracture. Bone marrow signal intensity somewhat diffusely decreased on T1 weighted imaging, most commonly related to anemia, smoking or obesity. Mild reactive endplate changes present about the T6-7 interspace anteriorly. No discrete or worrisome osseous lesions. No other abnormal marrow edema or enhancement. Signal intensity within the thoracic spinal cord is normal. Normal cord caliber and morphology. No cord signal abnormality or abnormal enhancement. Conus medullaris terminates at the L1 level. Paraspinous soft tissues within normal limits. Visualized lungs are clear. Visualized visceral structures unremarkable. No other significant degenerative changes seen within the thoracic spine. No significant disc bulge or disc protrusion. No canal or foraminal stenosis. IMPRESSION: MRI HEAD  IMPRESSION 1. Previously identified T2/FLAIR signal abnormalities  within the left frontal lobe demonstrate patchy post-contrast enhancement. Differential as previously described. 2. No other abnormal enhancement elsewhere within the brain. MRA HEAD IMPRESSION Normal intracranial MRA. No large vessel occlusion or hemodynamically significant stenosis. No significant vascular irregularity or beading to suggest vasculitis. MRI CERVICAL SPINE IMPRESSION 1. Normal MRI appearance of the cervical spinal cord. No cord signal abnormality or abnormal enhancement. 2. 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 Mild bilateral C6 and left C7 foraminal narrowing. MRI THORACIC SPINE IMPRESSION 1. Normal MRI appearance of the thoracic spinal cord. No cord signal abnormality or abnormal enhancement. 2. Levoscoliosis without significant degenerative spondylolysis for age. No stenosis. Electronically Signed   By: Jeannine Boga M.D.   On: 03/14/2018 05:44   Mr Brain Wo Contrast  Result Date: 03/13/2018 CLINICAL DATA:  Initial evaluation for acute right-sided numbness with headache. History of cluster headaches/migraine, seizure, prior right-sided visual loss. EXAM: MRI HEAD WITHOUT CONTRAST TECHNIQUE: Multiplanar, multiecho pulse sequences of the brain and surrounding structures were obtained without intravenous contrast. COMPARISON:  Comparison made with prior CT from earlier the same day as well as previous MRIs from 02/14/2016 and 06/30/2015. FINDINGS: Brain: Cerebral volume stable, and within normal limits for age. Again seen are 2 juxta cortical T2/FLAIR hyperintensities involving the anterior left frontal lobe, previously seen on previous exam. The more posterior lesion seen on FLAIR sequence image 18 appears increased in size from previous, now measuring 2.2 cm in transverse diameter, previously 1.3 cm in diameter. The more anterior lesion is relatively stable in size and  appearance. Adjacent T1 hypointense Keefe Zawistowski hole noted adjacent to this lesion. There is a new 13 mm juxta cortical T2/FLAIR lesion at the parasagittal high left frontal lobe (series 11, image 21). No associated restricted diffusion. No other new lesions identified. No lesions within the right cerebral hemisphere or infratentorial brain or brainstem. No other abnormal foci of restricted diffusion to suggest acute or subacute ischemia. Gray-white matter differentiation otherwise maintained. No evidence for acute intracranial hemorrhage. Trace chronic hemosiderin staining noted about 1 of the chronic left frontal FLAIR signal abnormalities (series 12, image 40). No other evidence for acute or chronic intracranial hemorrhage. No other mass lesion, midline shift or mass effect. No hydrocephalus. No extra-axial fluid collection. Pituitary gland within normal limits. Midline structures intact and normal. Vascular: Major intracranial vascular flow voids maintained. Skull and upper cervical spine: Craniocervical junction normal. Upper cervical spine within normal limits. No focal marrow replacing lesion. Scalp soft tissues unremarkable. Sinuses/Orbits: Globes and orbital soft tissues within normal limits. Small right maxillary sinus retention cyst. Mild scattered mucosal thickening within the ethmoidal air cells. No mastoid effusion. Other: None. IMPRESSION: 1. New 13 mm juxta cortical FLAIR signal abnormality at the parasagittal left frontal lobe as above. Previously identified two additional chronic juxta cortical FLAIR hyperintensities again seen, the more posterior of which is slightly more prominent as compared to previous exam. Findings are nonspecific, and could reflect the sequelae of underlying demyelinating disease. An infectious or inflammatory process, or changes related to vasculitis could also be considered. Subacute ischemia is also possible, but felt to be less likely given the patient's presenting symptoms.  Tumor felt to be unlikely given the overall appearance and previously seen waxing and waning enhancement about the pre-existing left frontal lesions. Further assessment with postcontrast imaging could be performed for further evaluation as desired, as there is anticipated to be a small amount of enhancement about the new parasagittal lesion. 2. Otherwise stable normal  MRI appearance of the brain. Electronically Signed   By: Jeannine Boga M.D.   On: 03/13/2018 22:40   Mr Brain W Contrast  Result Date: 03/14/2018 CLINICAL DATA:  Initial evaluation for right-sided numbness, tingling. History of cluster headaches, migraine, and seizure. EXAM: MRI HEAD WITH CONTRAST MRA HEAD WITHOUT CONTRAST MRI CERVICAL SPINE WITHOUT AND WITH CONTRAST MRI THORACIC SPINE WITHOUT AND WITH CONTRAST TECHNIQUE: Multiplanar, multiecho pulse sequences of the brain and surrounding structures were obtained with intravenous contrast. Angiographic images of the head were obtained using MRA technique with contrast. Multiplanar, multiecho pulse sequences of the cervical and thoracic spine were obtained without and with intravenous contrast. CONTRAST:  8 cc of Gadavist. COMPARISON:  Prior brain MRI from 03/13/2017. FINDINGS: MRI HEAD FINDINGS Postcontrast imaging through the brain demonstrates patchy enhancement about the previously identified T2/FLAIR signal abnormalities at the left frontal lobe, including the new parasagittal lesion (series 39, images 39, 45, 46). No other abnormal enhancement elsewhere within the brain. MRA HEAD FINDINGS ANTERIOR CIRCULATION: Distal cervical segments of the internal carotid arteries are widely patent with symmetric antegrade flow. Petrous, cavernous, and supraclinoid segments patent without flow-limiting stenosis. A1 segments widely patent. Normal anterior communicating artery. Anterior cerebral arteries well perfused to their distal aspects without stenosis. No M1 stenosis or occlusion. Distal MCA  branches well perfused and symmetric. No significant irregularity or beading to suggest vasculitis. POSTERIOR CIRCULATION: Vertebral arteries widely patent to the vertebrobasilar junction without stenosis. Right vertebral artery slightly dominant. Posterior inferior cerebral arteries patent bilaterally. Basilar widely patent to its distal aspect without stenosis. Superior cerebral arteries patent bilaterally. Both posterior cerebral arteries well perfused to their distal aspects without stenosis. Small bilateral posterior communicating arteries noted. No intracranial aneurysm or other abnormality. MRI CERVICAL SPINE FINDINGS Straightening with slight reversal of the normal cervical lordosis. No listhesis or subluxation. Vertebral body heights well maintained without evidence for acute or chronic fracture. Bone marrow signal intensity somewhat diffusely decreased on T1 weighted imaging, most commonly related to anemia, smoking, or obesity. No discrete or worrisome osseous lesions. No abnormal marrow edema or enhancement. Signal intensity within the cervical spinal cord is normal. No cord signal abnormality or abnormal enhancement. Normal cord caliber and morphology. Craniocervical junction within normal limits. Paraspinous and prevertebral soft tissues unremarkable. Normal intravascular flow voids seen within the vertebral arteries bilaterally. C2-C3: Unremarkable. C3-C4:  Unremarkable. C4-C5: Diffuse circumferential disc bulge with mild intervertebral disc space narrowing. Flattening of the ventral CSF without significant spinal stenosis or cord deformity. Foramina remain patent. C5-C6: Mild circumferential disc osteophyte complex with intervertebral disc space narrowing. Flattening of the ventral thecal sac without significant spinal stenosis. Mild bilateral C6 foraminal narrowing. C6-C7: Mild circumferential disc osteophyte complex, slightly eccentric to the left. Flattening of the ventral thecal sac without  significant spinal stenosis. Mild left C7 foraminal narrowing. C7-T1:  Unremarkable. MRI THORACIC SPINE FINDINGS Mild levoscoliosis with accentuation of the normal thoracic kyphosis. Alignment otherwise normal. No listhesis or subluxation. Vertebral body heights maintained without evidence for acute or chronic fracture. Bone marrow signal intensity somewhat diffusely decreased on T1 weighted imaging, most commonly related to anemia, smoking or obesity. Mild reactive endplate changes present about the T6-7 interspace anteriorly. No discrete or worrisome osseous lesions. No other abnormal marrow edema or enhancement. Signal intensity within the thoracic spinal cord is normal. Normal cord caliber and morphology. No cord signal abnormality or abnormal enhancement. Conus medullaris terminates at the L1 level. Paraspinous soft tissues within normal limits. Visualized lungs are clear. Visualized  visceral structures unremarkable. No other significant degenerative changes seen within the thoracic spine. No significant disc bulge or disc protrusion. No canal or foraminal stenosis. IMPRESSION: MRI HEAD IMPRESSION 1. Previously identified T2/FLAIR signal abnormalities within the left frontal lobe demonstrate patchy post-contrast enhancement. Differential as previously described. 2. No other abnormal enhancement elsewhere within the brain. MRA HEAD IMPRESSION Normal intracranial MRA. No large vessel occlusion or hemodynamically significant stenosis. No significant vascular irregularity or beading to suggest vasculitis. MRI CERVICAL SPINE IMPRESSION 1. Normal MRI appearance of the cervical spinal cord. No cord signal abnormality or abnormal enhancement. 2. 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 Mild bilateral C6 and left C7 foraminal narrowing. MRI THORACIC SPINE IMPRESSION 1. Normal MRI appearance of the thoracic spinal cord. No cord signal abnormality or abnormal enhancement. 2.  Levoscoliosis without significant degenerative spondylolysis for age. No stenosis. Electronically Signed   By: Jeannine Boga M.D.   On: 03/14/2018 05:44   Mr Cervical Spine W Wo Contrast  Result Date: 03/14/2018 CLINICAL DATA:  Initial evaluation for right-sided numbness, tingling. History of cluster headaches, migraine, and seizure. EXAM: MRI HEAD WITH CONTRAST MRA HEAD WITHOUT CONTRAST MRI CERVICAL SPINE WITHOUT AND WITH CONTRAST MRI THORACIC SPINE WITHOUT AND WITH CONTRAST TECHNIQUE: Multiplanar, multiecho pulse sequences of the brain and surrounding structures were obtained with intravenous contrast. Angiographic images of the head were obtained using MRA technique with contrast. Multiplanar, multiecho pulse sequences of the cervical and thoracic spine were obtained without and with intravenous contrast. CONTRAST:  8 cc of Gadavist. COMPARISON:  Prior brain MRI from 03/13/2017. FINDINGS: MRI HEAD FINDINGS Postcontrast imaging through the brain demonstrates patchy enhancement about the previously identified T2/FLAIR signal abnormalities at the left frontal lobe, including the new parasagittal lesion (series 39, images 39, 45, 46). No other abnormal enhancement elsewhere within the brain. MRA HEAD FINDINGS ANTERIOR CIRCULATION: Distal cervical segments of the internal carotid arteries are widely patent with symmetric antegrade flow. Petrous, cavernous, and supraclinoid segments patent without flow-limiting stenosis. A1 segments widely patent. Normal anterior communicating artery. Anterior cerebral arteries well perfused to their distal aspects without stenosis. No M1 stenosis or occlusion. Distal MCA branches well perfused and symmetric. No significant irregularity or beading to suggest vasculitis. POSTERIOR CIRCULATION: Vertebral arteries widely patent to the vertebrobasilar junction without stenosis. Right vertebral artery slightly dominant. Posterior inferior cerebral arteries patent bilaterally.  Basilar widely patent to its distal aspect without stenosis. Superior cerebral arteries patent bilaterally. Both posterior cerebral arteries well perfused to their distal aspects without stenosis. Small bilateral posterior communicating arteries noted. No intracranial aneurysm or other abnormality. MRI CERVICAL SPINE FINDINGS Straightening with slight reversal of the normal cervical lordosis. No listhesis or subluxation. Vertebral body heights well maintained without evidence for acute or chronic fracture. Bone marrow signal intensity somewhat diffusely decreased on T1 weighted imaging, most commonly related to anemia, smoking, or obesity. No discrete or worrisome osseous lesions. No abnormal marrow edema or enhancement. Signal intensity within the cervical spinal cord is normal. No cord signal abnormality or abnormal enhancement. Normal cord caliber and morphology. Craniocervical junction within normal limits. Paraspinous and prevertebral soft tissues unremarkable. Normal intravascular flow voids seen within the vertebral arteries bilaterally. C2-C3: Unremarkable. C3-C4:  Unremarkable. C4-C5: Diffuse circumferential disc bulge with mild intervertebral disc space narrowing. Flattening of the ventral CSF without significant spinal stenosis or cord deformity. Foramina remain patent. C5-C6: Mild circumferential disc osteophyte complex with intervertebral disc space narrowing. Flattening of the ventral thecal sac without significant spinal  stenosis. Mild bilateral C6 foraminal narrowing. C6-C7: Mild circumferential disc osteophyte complex, slightly eccentric to the left. Flattening of the ventral thecal sac without significant spinal stenosis. Mild left C7 foraminal narrowing. C7-T1:  Unremarkable. MRI THORACIC SPINE FINDINGS Mild levoscoliosis with accentuation of the normal thoracic kyphosis. Alignment otherwise normal. No listhesis or subluxation. Vertebral body heights maintained without evidence for acute or  chronic fracture. Bone marrow signal intensity somewhat diffusely decreased on T1 weighted imaging, most commonly related to anemia, smoking or obesity. Mild reactive endplate changes present about the T6-7 interspace anteriorly. No discrete or worrisome osseous lesions. No other abnormal marrow edema or enhancement. Signal intensity within the thoracic spinal cord is normal. Normal cord caliber and morphology. No cord signal abnormality or abnormal enhancement. Conus medullaris terminates at the L1 level. Paraspinous soft tissues within normal limits. Visualized lungs are clear. Visualized visceral structures unremarkable. No other significant degenerative changes seen within the thoracic spine. No significant disc bulge or disc protrusion. No canal or foraminal stenosis. IMPRESSION: MRI HEAD IMPRESSION 1. Previously identified T2/FLAIR signal abnormalities within the left frontal lobe demonstrate patchy post-contrast enhancement. Differential as previously described. 2. No other abnormal enhancement elsewhere within the brain. MRA HEAD IMPRESSION Normal intracranial MRA. No large vessel occlusion or hemodynamically significant stenosis. No significant vascular irregularity or beading to suggest vasculitis. MRI CERVICAL SPINE IMPRESSION 1. Normal MRI appearance of the cervical spinal cord. No cord signal abnormality or abnormal enhancement. 2. 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 Mild bilateral C6 and left C7 foraminal narrowing. MRI THORACIC SPINE IMPRESSION 1. Normal MRI appearance of the thoracic spinal cord. No cord signal abnormality or abnormal enhancement. 2. Levoscoliosis without significant degenerative spondylolysis for age. No stenosis. Electronically Signed   By: Jeannine Boga M.D.   On: 03/14/2018 05:44   Mr Thoracic Spine W Wo Contrast  Result Date: 03/14/2018 CLINICAL DATA:  Initial evaluation for right-sided numbness, tingling. History of  cluster headaches, migraine, and seizure. EXAM: MRI HEAD WITH CONTRAST MRA HEAD WITHOUT CONTRAST MRI CERVICAL SPINE WITHOUT AND WITH CONTRAST MRI THORACIC SPINE WITHOUT AND WITH CONTRAST TECHNIQUE: Multiplanar, multiecho pulse sequences of the brain and surrounding structures were obtained with intravenous contrast. Angiographic images of the head were obtained using MRA technique with contrast. Multiplanar, multiecho pulse sequences of the cervical and thoracic spine were obtained without and with intravenous contrast. CONTRAST:  8 cc of Gadavist. COMPARISON:  Prior brain MRI from 03/13/2017. FINDINGS: MRI HEAD FINDINGS Postcontrast imaging through the brain demonstrates patchy enhancement about the previously identified T2/FLAIR signal abnormalities at the left frontal lobe, including the new parasagittal lesion (series 39, images 39, 45, 46). No other abnormal enhancement elsewhere within the brain. MRA HEAD FINDINGS ANTERIOR CIRCULATION: Distal cervical segments of the internal carotid arteries are widely patent with symmetric antegrade flow. Petrous, cavernous, and supraclinoid segments patent without flow-limiting stenosis. A1 segments widely patent. Normal anterior communicating artery. Anterior cerebral arteries well perfused to their distal aspects without stenosis. No M1 stenosis or occlusion. Distal MCA branches well perfused and symmetric. No significant irregularity or beading to suggest vasculitis. POSTERIOR CIRCULATION: Vertebral arteries widely patent to the vertebrobasilar junction without stenosis. Right vertebral artery slightly dominant. Posterior inferior cerebral arteries patent bilaterally. Basilar widely patent to its distal aspect without stenosis. Superior cerebral arteries patent bilaterally. Both posterior cerebral arteries well perfused to their distal aspects without stenosis. Small bilateral posterior communicating arteries noted. No intracranial aneurysm or other abnormality. MRI  CERVICAL SPINE FINDINGS Straightening  with slight reversal of the normal cervical lordosis. No listhesis or subluxation. Vertebral body heights well maintained without evidence for acute or chronic fracture. Bone marrow signal intensity somewhat diffusely decreased on T1 weighted imaging, most commonly related to anemia, smoking, or obesity. No discrete or worrisome osseous lesions. No abnormal marrow edema or enhancement. Signal intensity within the cervical spinal cord is normal. No cord signal abnormality or abnormal enhancement. Normal cord caliber and morphology. Craniocervical junction within normal limits. Paraspinous and prevertebral soft tissues unremarkable. Normal intravascular flow voids seen within the vertebral arteries bilaterally. C2-C3: Unremarkable. C3-C4:  Unremarkable. C4-C5: Diffuse circumferential disc bulge with mild intervertebral disc space narrowing. Flattening of the ventral CSF without significant spinal stenosis or cord deformity. Foramina remain patent. C5-C6: Mild circumferential disc osteophyte complex with intervertebral disc space narrowing. Flattening of the ventral thecal sac without significant spinal stenosis. Mild bilateral C6 foraminal narrowing. C6-C7: Mild circumferential disc osteophyte complex, slightly eccentric to the left. Flattening of the ventral thecal sac without significant spinal stenosis. Mild left C7 foraminal narrowing. C7-T1:  Unremarkable. MRI THORACIC SPINE FINDINGS Mild levoscoliosis with accentuation of the normal thoracic kyphosis. Alignment otherwise normal. No listhesis or subluxation. Vertebral body heights maintained without evidence for acute or chronic fracture. Bone marrow signal intensity somewhat diffusely decreased on T1 weighted imaging, most commonly related to anemia, smoking or obesity. Mild reactive endplate changes present about the T6-7 interspace anteriorly. No discrete or worrisome osseous lesions. No other abnormal marrow edema or  enhancement. Signal intensity within the thoracic spinal cord is normal. Normal cord caliber and morphology. No cord signal abnormality or abnormal enhancement. Conus medullaris terminates at the L1 level. Paraspinous soft tissues within normal limits. Visualized lungs are clear. Visualized visceral structures unremarkable. No other significant degenerative changes seen within the thoracic spine. No significant disc bulge or disc protrusion. No canal or foraminal stenosis. IMPRESSION: MRI HEAD IMPRESSION 1. Previously identified T2/FLAIR signal abnormalities within the left frontal lobe demonstrate patchy post-contrast enhancement. Differential as previously described. 2. No other abnormal enhancement elsewhere within the brain. MRA HEAD IMPRESSION Normal intracranial MRA. No large vessel occlusion or hemodynamically significant stenosis. No significant vascular irregularity or beading to suggest vasculitis. MRI CERVICAL SPINE IMPRESSION 1. Normal MRI appearance of the cervical spinal cord. No cord signal abnormality or abnormal enhancement. 2. 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 Mild bilateral C6 and left C7 foraminal narrowing. MRI THORACIC SPINE IMPRESSION 1. Normal MRI appearance of the thoracic spinal cord. No cord signal abnormality or abnormal enhancement. 2. Levoscoliosis without significant degenerative spondylolysis for age. No stenosis. Electronically Signed   By: Jeannine Boga M.D.   On: 03/14/2018 05:44   Ct Head Code Stroke Wo Contrast  Result Date: 03/13/2018 CLINICAL DATA:  Code stroke. Acute onset of right-sided facial droop and weakness began 45 minutes ago. EXAM: CT HEAD WITHOUT CONTRAST TECHNIQUE: Contiguous axial images were obtained from the base of the skull through the vertex without intravenous contrast. COMPARISON:  MRI brain 02/14/2016 FINDINGS: Brain: Remote high left frontal lobe infarct is again seen on image 25. No acute  infarct, hemorrhage, or mass lesion is present. No significant white matter lesions are present. Basal ganglia are intact. Insular ribbon is normal The brainstem and cerebellum are within normal limits. The ventricles are of normal size. No significant extraaxial fluid collection is present. Vascular: No hyperdense vessel or unexpected calcification. Skull: Calvarium is intact. No focal lytic or blastic lesions are present. Sinuses/Orbits: A polyp or  mucous retention cyst is again noted in the posterior right maxillary sinus. The paranasal sinuses and mastoid air cells are otherwise clear. The globes and orbits are within normal limits. ASPECTS [facility] Stroke Program Early CT Score) - Ganglionic level infarction (caudate, lentiform nuclei, internal capsule, insula, M1-M3 cortex): 7/7 - Supraganglionic infarction (M4-M6 cortex): 3/3 Total score (0-10 with 10 being normal): 10/10 IMPRESSION: 1. No acute intracranial abnormality. 2. Stable remote left frontal lobe infarct. 3. ASPECTS is 10/10 The above was relayed via text pager to Dr. Lorraine Lax on 03/13/2018 at 16:54 . Electronically Signed   By: San Morelle M.D.   On: 03/13/2018 16:55    Scheduled Meds: . atropine  1 drop Right Eye TID  . brimonidine  1 drop Right Eye TID  . dorzolamide-timolol  1 drop Right Eye BID  . latanoprost  1 drop Both Eyes QHS   Continuous Infusions:  Principal Problem:   Right sided weakness Active Problems:   Headache, chronic migraine without aura, intractable   Dysarthria   Anxiety   Thrombocytopenia (HCC)   Brain lesion    Time spent: [place] NP  [facility]  If 7PM-7AM, please contact night-coverage at [email or web address], password [facility] 03/14/2018, 10:48 AM  LOS: 0 days

## 2018-03-14 NOTE — Evaluation (Signed)
Clinical/Bedside Swallow Evaluation Patient Details  Name: Angel Rodriguez MRN: [id number] Date of Birth: 02-26-1976  Today's Date: 03/14/2018 Time: SLP Start Time (ACUTE ONLY): 1500 SLP Stop Time (ACUTE ONLY): 1530 SLP Time Calculation (min) (ACUTE ONLY): 30 min  Past Medical History:  Past Medical History:  Diagnosis Date  . Anxiety   . 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 Cluster headaches    started after seizure and migraines  . Complication of anesthesia    trouble with short term memory after surgeries  . Depression   . Fibroids    s/p TLH, bilateral salpingectomy  . GERD (gastroesophageal reflux disease)    patient thinks due to medications  . History of anemia    prior to hysterectomy  . History of pneumonia   . History of trichomoniasis   . Seizures ([place]) x 1 2-3 yrs ago none since   once saw neurologist, full body brain MRI and another 6 months lster  . Status post right oophorectomy   . Thrombocytopenia ([place])    sees [place] with Heme Onc   Past Surgical History:  Past Surgical History:  Procedure Laterality Date  . CYSTOSCOPY N/A 07/31/2017   Procedure: CYSTOSCOPY;  Surgeon: Megan Salon, MD;  Location: [facility];  Service: Gynecology;  Laterality: N/A;  . ENDOMETRIAL ABLATION    . EYE SURGERY     laser - removed blood from right eye  . HYSTEROSCOPY W/D&C    . LAPAROSCOPY N/A 09/19/2017   Procedure: LAPAROSCOPY DIAGNOSTIC WITH RIGHT OOPHERECTOMY, LYSIS OF ADHESIONS;  Surgeon: Salvadore Dom, MD;  Location: [facility];  Service: Gynecology;  Laterality: N/A;  . OMENTECTOMY N/A 11/21/2017   Procedure: OMENTECTOMY AND PELVIC WASHINGS;  Surgeon: Isabel Caprice, MD;  Location: [facility];  Service: Gynecology;  Laterality: N/A;  . ROBOTIC ASSISTED SALPINGO OOPHERECTOMY Left 11/21/2017   Procedure: XI ROBOTIC ASSISTED LEFT SALPINGO OOPHORECTOMY;  Surgeon: Isabel Caprice, MD;  Location: [facility];  Service: Gynecology;  Laterality: Left;  . TOTAL LAPAROSCOPIC  HYSTERECTOMY WITH SALPINGECTOMY Bilateral 07/31/2017   Procedure: TOTAL LAPAROSCOPIC HYSTERECTOMY WITH SALPINGECTOMY;  Surgeon: Megan Salon, MD;  Location: [facility];  Service: Gynecology;  Laterality: Bilateral;  20 week size uterus/ Alexis bag in room/ need 4.5 hours  . TUBAL LIGATION    . WISDOM TOOTH EXTRACTION     HPI:  43 year old female admitted 03/13/18 with right side facial numbness and numbness. PMH: cluster headache/migraine headaches, seizure, anxiety, thrombocytopenia, anxiety, depression, fibroids, blind right eye. GERD.  MRI negative for stroke.   Assessment / Plan / Recommendation Clinical Impression  Pt presents with right facial weakness and asymmetry, and reports only slight difference in sensation on the right. Speech is intermittently dysarthric, due in part to low volume and rapid speech rate. Pt was given trials of thin liquid, puree, and solid consistencies. No obvious oral issues or overt s/s aspiration observed on any consistency. At this time, will continue regular diet and thin liquids. ST will continue to follow pt for test results, and provide appropriate treatment recommendations if needed. RN informed.  SLP Visit Diagnosis: Dysphagia, unspecified (R13.10)    Aspiration Risk  Mild aspiration risk    Diet Recommendation Regular;Thin liquid   Liquid Administration via: Cup;Straw Medication Administration: Whole meds with liquid Supervision: Patient able to self feed Compensations: Minimize environmental distractions;Slow rate;Small sips/bites Postural Changes: Seated upright at 90 degrees    Other  Recommendations Oral Care Recommendations: Oral care  BID   Follow up Recommendations (TBD)      Frequency and Duration min 1 x/week  1 week;2 weeks       Prognosis Prognosis for Safe Diet Advancement: Good      Swallow Study   General Date of Onset: 03/13/18 HPI: 43 year old female admitted 03/13/18 with right side facial numbness and numbness. PMH: cluster  headache/migraine headaches, seizure, anxiety, thrombocytopenia, anxiety, depression, fibroids, blind right eye. GERD.  MRI negative for stroke. Type of Study: Bedside Swallow Evaluation Previous Swallow Assessment: none found Diet Prior to this Study: Regular;Thin liquids Temperature Spikes Noted: No Respiratory Status: Room air History of Recent Intubation: No Behavior/Cognition: Alert;Cooperative;Pleasant mood Oral Cavity Assessment: Within Functional Limits Oral Care Completed by SLP: No Oral Cavity - Dentition: Adequate natural dentition Vision: Functional for self-feeding Self-Feeding Abilities: Able to feed self Patient Positioning: Upright in bed Baseline Vocal Quality: Normal Volitional Cough: Strong Volitional Swallow: Able to elicit    Oral/Motor/Sensory Function Overall Oral Motor/Sensory Function: Mild impairment Facial ROM: Reduced right Facial Symmetry: Abnormal symmetry right Facial Strength: Reduced right Facial Sensation: Reduced right Lingual ROM: Within Functional Limits Lingual Symmetry: Within Functional Limits Lingual Strength: Within Functional Limits Lingual Sensation: Within Functional Limits Mandible: Within Functional Limits   Ice Chips Ice chips: Not tested   Thin Liquid Thin Liquid: Within functional limits Presentation: Straw    Nectar Thick Nectar Thick Liquid: Not tested   Honey Thick Honey Thick Liquid: Not tested   Puree Puree: Within functional limits Presentation: Self Fed;Spoon   Solid     Solid: Within functional limits Presentation: Demorest B. Quentin Ore, [facility], [place] Speech Language Pathologist [phone]  Shonna Chock 03/14/2018,3:40 PM

## 2018-03-14 NOTE — ED Notes (Signed)
Pt connected back to monitor bp etc.  Pt reports maybe her headache is better.  alert

## 2018-03-15 ENCOUNTER — Observation Stay (HOSPITAL_COMMUNITY): Payer: PRIVATE HEALTH INSURANCE

## 2018-03-15 DIAGNOSIS — G43719 Chronic migraine without aura, intractable, without status migrainosus: Secondary | ICD-10-CM

## 2018-03-15 DIAGNOSIS — R531 Weakness: Secondary | ICD-10-CM | POA: Diagnosis not present

## 2018-03-15 DIAGNOSIS — R200 Anesthesia of skin: Secondary | ICD-10-CM | POA: Diagnosis not present

## 2018-03-15 DIAGNOSIS — R471 Dysarthria and anarthria: Secondary | ICD-10-CM

## 2018-03-15 LAB — BASIC METABOLIC PANEL
Anion gap: 10 (ref 5–15)
BUN: 13 mg/dL (ref 6–20)
CHLORIDE: 107 mmol/L (ref 98–111)
CO2: 21 mmol/L — ABNORMAL LOW (ref 22–32)
Calcium: 10 mg/dL (ref 8.9–10.3)
Creatinine, Ser: 0.77 mg/dL (ref 0.44–1.00)
GFR calc Af Amer: >60 mL/min (ref >60)
GFR calc non Af Amer: >60 mL/min (ref >60)
Glucose, Bld: 123 mg/dL — ABNORMAL HIGH (ref 70–99)
Potassium: 3.8 mmol/L (ref 3.5–5.1)
Sodium: 138 mmol/L (ref 135–145)

## 2018-03-15 LAB — PROTEIN, CSF: Total  Protein, CSF: 30 mg/dL (ref 15–45)

## 2018-03-15 LAB — CSF CELL COUNT WITH DIFFERENTIAL
Eosinophils, CSF: 0 % (ref 0–1)
Lymphs, CSF: 89 % — ABNORMAL HIGH (ref 40–80)
Monocyte-Macrophage-Spinal Fluid: 11 % — ABNORMAL LOW (ref 15–45)
RBC Count, CSF: 3 /mm3 — ABNORMAL HIGH
Segmented Neutrophils-CSF: 0 % (ref 0–6)
Tube #: 3
WBC, CSF: 10 /mm3 — ABNORMAL HIGH (ref 0–5)

## 2018-03-15 LAB — GLUCOSE, CSF: Glucose, CSF: 84 mg/dL — ABNORMAL HIGH (ref 40–70)

## 2018-03-15 LAB — SEDIMENTATION RATE: Sed Rate: 10 mm/hr (ref 0–22)

## 2018-03-15 LAB — C-REACTIVE PROTEIN: CRP: 1.1 mg/dL — ABNORMAL HIGH (ref <1.0)

## 2018-03-15 IMAGING — RF DG FLUORO GUIDE LUMBAR PUNCTURE
2 series · 2 of 2 positions shown · non-contrast
Comparison: none

CLINICAL DATA: 42-year-old female with history of unilateral
weakness. Evaluate for potential multiple sclerosis.

[Series 1: cp_standard · 0.17mm/px · 1 of 1 slices shown (1 of 2)]
[im 1/1]
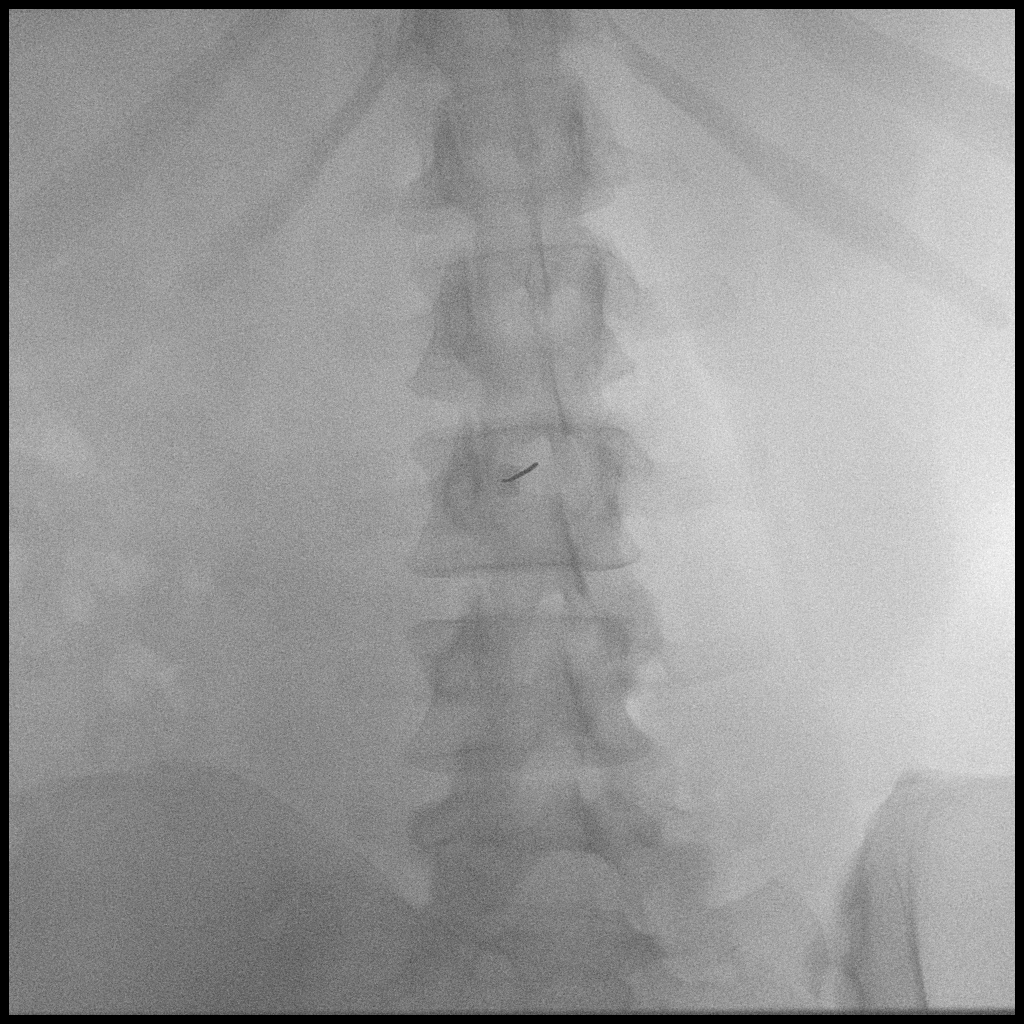

[Series 2: cp_standard · 0.17mm/px · 1 of 1 slices shown (2 of 2)]
[im 1/1]
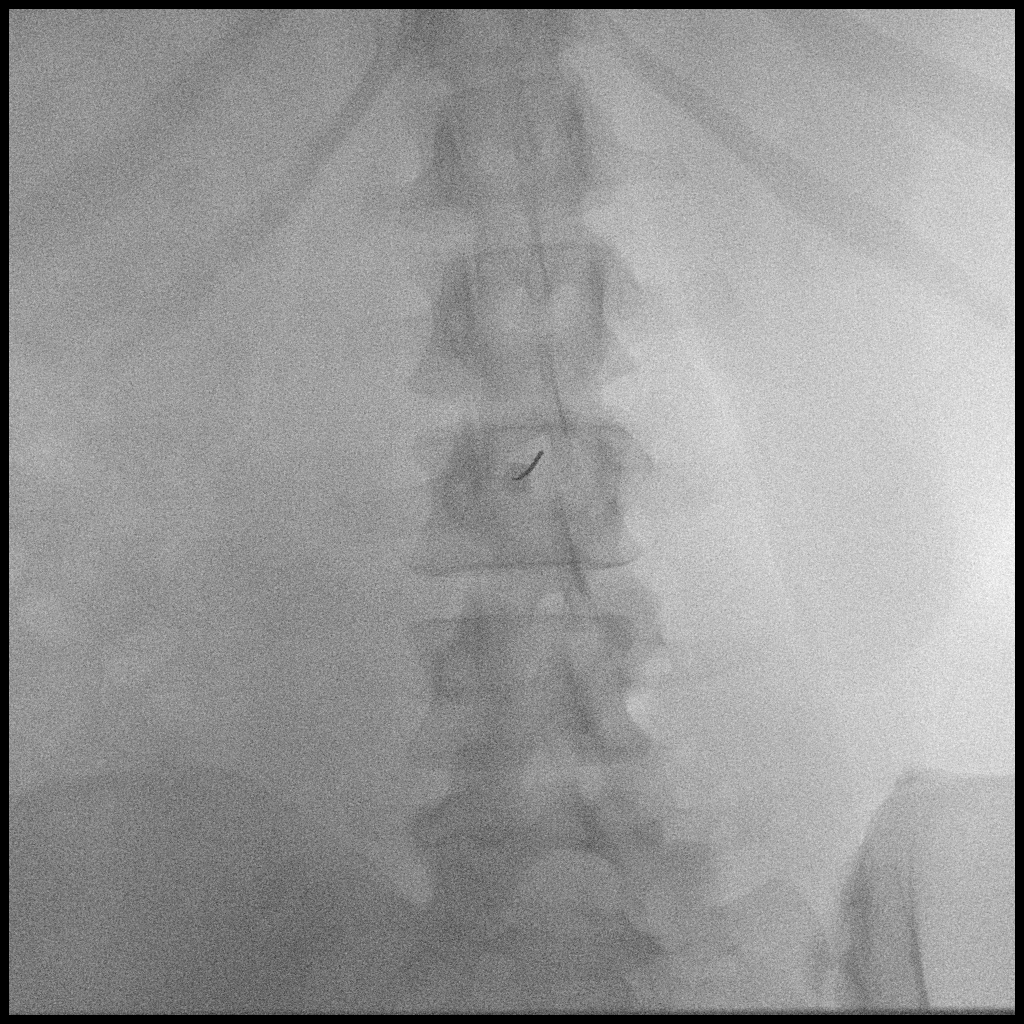

[2 of 2 positions shown; findings below may reference images not displayed]

EXAM:
DIAGNOSTIC LUMBAR PUNCTURE UNDER FLUOROSCOPIC GUIDANCE

FLUOROSCOPY TIME:  Fluoroscopy Time:  1 minutes

Radiation Exposure Index (if provided by the fluoroscopic device):
6.4 mGy

PROCEDURE:
Informed consent was obtained from the patient prior to the
procedure, including potential complications of headache, allergy,
and pain. With the patient prone, the lower back was prepped with
Betadine. 1% Lidocaine was used for local anesthesia. Lumbar
puncture was performed at the L2-L3 level using a 20 gauge needle
with return of clear CSF with an opening pressure of 21 cm water. 12
ml of CSF were obtained for laboratory studies. The patient
tolerated the procedure well and there were no apparent
complications.
IMPRESSION: 1. Successful fluoroscopic guided lumbar puncture, as above.

## 2018-03-15 MED ORDER — METHYLPREDNISOLONE 4 MG PO TBPK: ORAL_TABLET | ORAL | 0 refills | Status: DC

## 2018-03-15 MED ORDER — SODIUM CHLORIDE 0.9 % IV SOLN: 500.0000 mg | Freq: Two times a day (BID) | INTRAVENOUS | 0 refills | Status: DC

## 2018-03-15 MED ORDER — LIDOCAINE HCL (PF) 1 % IJ SOLN
5.0000 mL | Freq: Once | INTRAMUSCULAR | Status: AC
  Administered 2018-03-15: 5 mL via INTRADERMAL

## 2018-03-15 NOTE — Progress Notes (Signed)
Pt called @ 2205 on 03/15/2018 and stated received Keppra 500mg  IV prescription. Dr. Mike Craze paged and returned called and made aware that prescription Dr. Lorraine Lax prescribed needed to be changed to PO. Provider stated will change prescription route, and will call [facility] on [address] to make change.  Azucena Cecil RN

## 2018-03-15 NOTE — Progress Notes (Signed)
Reason for consult:  Transient right side numbness  Subjective: Patient underwent LP. She feels back to her baseline, no complaints of numbness on the right side.    ROS: negative except above  Examination  Vital signs in last 24 hours: Temp:  [97.7 F (36.5 C)-98.1 F (36.7 C)] 97.7 F (36.5 C) (01/10 1142) Pulse Rate:  [59-69] 59 (01/10 1142) Resp:  [18] 18 (01/10 1142) BP: (92-117)/(54-61) 117/54 (01/10 1142) SpO2:  [96 %-99 %] 96 % (01/10 1142)  General: lying in bed CVS: pulse-normal rate and rhythm RS: breathing comfortably Extremities: normal   Neuro: MS: Alert, oriented, follows commands CN: pupils equal and reactive,  EOMI, face symmetric, tongue midline, normal sensation over face, Blind in the right eye  Motor: 5/5 strength in all 4 extremities Reflexes: 2+ bilaterally over patella, biceps, plantars: flexor Sensation: intact bilaterally Coordination: normal Gait: not tested  Basic Metabolic Panel: Recent Labs  Lab 03/13/18 1634 03/13/18 1728 03/14/18 0316 03/15/18 0634  NA 138 140 136 138  K 3.5 3.4* 3.9 3.8  CL 106 106 107 107  CO2 21*  --  17* 21*  GLUCOSE 99 103* 194* 123*  BUN _0 CREATININE 0.84 0.80 0.86 0.77  CALCIUM 10.4*  --  10.0 10.0    CBC: Recent Labs  Lab 03/13/18 1728 03/13/18 1811 03/14/18 0316  WBC  --  6.6 8.4  HGB 13.6 13.6 13.3  HCT 40.0 41.1 40.0  MCV  --  89.2 89.1  PLT  --  59* 67*     Coagulation Studies: Recent Labs    03/13/18 1811  LABPROT 12.6  INR 0.95    Imaging Reviewed:     ASSESSMENT AND PLAN  43 year old female with complicated past medical history of vision loss in the right eye of unclear etiology-CRVO vs neurovascular glaucoma vs macular degeneration,  chronic headaches, seizures, anxiety, thrombocytopenia of unclear etiology presents with new onset right-sided numbness. MRI brain contrast-enhancing lesions in the left frontal lobe with new parasagittal lesion.  I reviewed MRI brain  with neuroradiologist Dr. Jobe Igo again, patient has  new lesions in parasaggital left frontal lesion which is contrast-enhancing,  The other lesion in the cortex is either a new lesion or an expansion of the previous posterior lesion-is also contrast-enhancing.  LP was obtained which showed normal protein of 30.  There was some mild pleocytosis predominantly lymphocytic with cell count of 10.  Glucose was normal.   MR Angiogram of the head was normal. MRA of neck not performed ( but likely will be low yield).  Serum ACE, ESR and CRP ordered  2017- p-ANCA/c-ANCA/myeloperoxidase antibodies and ANA is negative. HIV negative   Right side numbness with jacksonian march ? Focal seizure New left enhancing frontal lobe lesion- ? Demyelination vs infalammatory and less likely subacute stroke    Plan Will discharge patient on Medrol dose pack for presumed demyelination lesions and ? Multiple sclerosis Start Keppra 527m for possible focal seizures  F/U Oligoclonal bands, ACE levels - if normal may consider CTA Neck, Echo with bubble for completion of stroke workup Continue treat migraine headaches symptomatically.   Will inform Dr. SFelecia Shelling patient's neurologist regarding admission   Sushanth Aroor [facility] Pager Number [phone] questions after 7pm please refer to AMION to reach the Neurologist on call

## 2018-03-15 NOTE — Progress Notes (Signed)
PT Cancellation Note  Patient Details Name: Angel Rodriguez MRN: [id number] DOB: 08/14/1975   Cancelled Treatment:    Reason Eval/Treat Not Completed: Patient at procedure or test/unavailable. Pt currently off unit for lumbar puncture. Will check back as schedule allows to initiate PT evaluation.    Thelma Comp 03/15/2018, 11:27 AM   Rolinda Roan, PT, DPT Acute Rehabilitation Services Pager: [phone] Office: [phone]

## 2018-03-15 NOTE — Procedures (Signed)
Fluoroscopic lumbar puncture performed at L2-L3, opening pressure 21 cmH2O, 12 mL clear CSF obtained, no complications.  See dictation in PACS for full details.

## 2018-03-16 LAB — ANGIOTENSIN CONVERTING ENZYME: Angiotensin-Converting Enzyme: 33 U/L (ref 14–82)

## 2018-03-17 NOTE — Discharge Summary (Signed)
Angel Rodriguez, is a 43 y.o. female  DOB September 12, 1975  MRN [id number].  Admission date:  03/13/2018  Admitting Physician  Aline August, MD  Discharge Date:  03/17/2018   Primary MD  Earlie Raveling, NP  Recommendations for primary care physician for things to follow:  Neurology recommended following up pending oligoclonal bands and ACE levels.  If normal may consider CTA Neck, Echo with bubble for completion of stroke workup Continuetreat migraine headaches symptomatically.   Work-up of thrombocytopenia   Admission Diagnosis  Abnormal brain MRI [R90.89] Right sided numbness [R20.0]   Discharge Diagnosis   Principal Problem:   Right sided weakness Active Problems:   Headache, chronic migraine without aura, intractable   Anxiety   Thrombocytopenia (HCC)   Dysarthria   Brain lesion      Past Medical History:  Diagnosis Date  . Anxiety   . 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 Cluster headaches    started after seizure and migraines  . Complication of anesthesia    trouble with short term memory after surgeries  . Depression   . Fibroids    s/p TLH, bilateral salpingectomy  . GERD (gastroesophageal reflux disease)    patient thinks due to medications  . History of anemia    prior to hysterectomy  . History of pneumonia   . History of trichomoniasis   . Seizures ([place]) x 1 2-3 yrs ago none since   once saw neurologist, full body brain MRI and another 6 months lster  . Status post right oophorectomy   . Thrombocytopenia ([place])    sees [place] with Heme Onc    Past Surgical History:  Procedure Laterality Date  . CYSTOSCOPY N/A 07/31/2017   Procedure: CYSTOSCOPY;  Surgeon: Megan Salon, MD;  Location: [facility];  Service: Gynecology;  Laterality: N/A;  . ENDOMETRIAL ABLATION    . EYE SURGERY     laser -  removed blood from right eye  . HYSTEROSCOPY W/D&C    . LAPAROSCOPY N/A 09/19/2017   Procedure: LAPAROSCOPY DIAGNOSTIC WITH RIGHT OOPHERECTOMY, LYSIS OF ADHESIONS;  Surgeon: Salvadore Dom, MD;  Location: [facility];  Service: Gynecology;  Laterality: N/A;  . OMENTECTOMY N/A 11/21/2017   Procedure: OMENTECTOMY AND PELVIC WASHINGS;  Surgeon: Isabel Caprice, MD;  Location: [facility];  Service: Gynecology;  Laterality: N/A;  . ROBOTIC ASSISTED SALPINGO OOPHERECTOMY Left 11/21/2017   Procedure: XI ROBOTIC ASSISTED LEFT SALPINGO OOPHORECTOMY;  Surgeon: Isabel Caprice, MD;  Location: [facility];  Service: Gynecology;  Laterality: Left;  . TOTAL LAPAROSCOPIC HYSTERECTOMY WITH SALPINGECTOMY Bilateral 07/31/2017   Procedure: TOTAL LAPAROSCOPIC HYSTERECTOMY WITH SALPINGECTOMY;  Surgeon: Megan Salon, MD;  Location: [facility];  Service: Gynecology;  Laterality: Bilateral;  20 week size uterus/ Alexis bag in room/ need 4.5 hours  . TUBAL LIGATION    . WISDOM TOOTH EXTRACTION         HPI  from the history and physical done on the day of admission:  Angel Rodriguez is a 43 y.o. female with medical history significant of cluster headache/migraine headaches, seizure (patient states that she had a seizure 3 years ago once and is not on any antiseizure medications), anxiety, thrombocytopenia, anxiety, depression, fibroids, GERD presented with acute onset of right-sided numbness and headache while at work around 4 PM today.  Patient has history of chronic headaches but today around 4 PM she noticed that her right side of her face as well as right arm had numbness/tingling.  She also felt that her right upper extremity was slightly weak.  EMS was called and they noticed that patient was slow to respond and had difficulty completing sentences and there might have been mild facial droop.  No loss of consciousness/photophobia/phonophobia.  No seizure-like activities.  No bowel bladder incontinence, no tongue bite.  No fever,  nausea, vomiting, diarrhea, dysuria or abdominal pain.  Patient states that she has been in significant stress lately with her work and kids.  ED Course: Code stroke was called.  Neurology evaluated the patient.  CT of the head was negative for acute stroke.  MRI of the brain was done which did not show any stroke but showed new juxtacortical lesion in the left frontal lobe.  Neurology recommended admission under hospitalist service for further work-up including EEG and imaging studies.    Hospital Course:   1. Right-sided weakness and numbness with question of seizure: Symptoms are noted to improved on hospital day 1.  CT scan showed no acute stroke and stable appearance of left frontal infarct.  Neurology consulted.  EEG was noted to be normal.  MRI/MRA showed new junctional lesion in the left frontal lobe.  There was possible concern for MS and MRI of the cervical and thoracic spine were obtained, but noted to show no acute abnormalities.  Neurology obtained a LP of the patient.  Results CSF fluid: glucose 84, RBCs 3, WBC 10, segmented neutrophils 0, lymphocytes 89, monocyte- macrophages 11,: Colorless, and total protein 30.  Patient sed rate was 10, CRP 1.1, and angiotensin converting enzyme 33. Neurology was concern for possibility of seizure/MS for which he placed her on Keppra and recommended her take a Solu-Medrol Dosepak.  Dr. Lorraine Lax would notify patient's neurologist Dr. Felecia Shelling regarding this admission.  Also on the differential includes complex migraine versus stroke.   2. Brain lesion: Presumed demyelinating lesion.  There was concern for MS.  Otherwise as seen above  3. Chronic thrombocytopenia: Patient had no signs of bleeding.  Recommend outpatient hematology evaluation as to cause.  4. Cluster/migraine headaches: Continued Imitrex as needed.  5. Anxiety and depression: Patient was continued on medications of Loxitane and clonazepam during hospitalization.    Follow UP  Follow-up  Information    Sater, Nanine Means, MD. Schedule an appointment as soon as possible for a visit.   Specialty:  Neurology Why:  Please and call and make an appointment within 2 weeks Contact information: [place] [address] [phone]            Consults obtained: Neurology  Discharge Condition: Stable  Diet and Activity recommendation: See Discharge Instructions below  Discharge Instructions    Discharge Instructions    Diet general   Complete by:  As directed    Discharge instructions   Complete by:  As directed    Please follow-up with your neurologist within 1 to 2 weeks   Driving Restrictions   Complete by:  As directed    Please do not  drive until you are able to follow-up with your neurologist and get clearance.  There was possible concern for seizure there was possible concern that you may have had a seizure as the cause of your symptoms.   Increase activity slowly   Complete by:  As directed         Discharge Medications     Allergies as of 03/15/2018      Reactions   Ivp Dye [iodinated Diagnostic Agents] Hives, Itching, Swelling   Bee Venom Swelling      Medication List    TAKE these medications   acetaminophen-codeine 300-30 MG tablet Commonly known as:  TYLENOL #3 TAKE 1 TO 2 TABS BY MOUTH EVERY 4 HOURS AS NEEDED FOR MOD PAIN OR SEVERE PAIN (FOR POST OP PAIN)   atropine 1 % ophthalmic solution Place 1 drop into the right eye 3 (three) times daily.   bisacodyl 5 MG EC tablet Commonly known as:  DULCOLAX Take 1 tablet (5 mg total) by mouth daily as needed for moderate constipation.   brimonidine 0.1 % Soln Commonly known as:  ALPHAGAN P Place 1 drop into the right eye 3 (three) times daily.   calcium carbonate 750 MG chewable tablet Commonly known as:  TUMS EX Chew 2 tablets by mouth as needed for heartburn.   clonazePAM 0.5 MG tablet Commonly known as:  KLONOPIN Take 1 tablet (0.5 mg total) by mouth 2 (two) times daily as  needed. for anxiety   dorzolamide-timolol 22.3-6.8 MG/ML ophthalmic solution Commonly known as:  COSOPT Place 1 drop into the right eye 2 (two) times daily.   FLUoxetine 20 MG capsule Commonly known as:  PROZAC Take 1 capsule (20 mg total) by mouth daily. What changed:    when to take this  reasons to take this   fluticasone 50 MCG/ACT nasal spray Commonly known as:  FLONASE Place 1-2 sprays into both nostrils daily as needed for allergies.   levETIRAcetam 500 mg in sodium chloride 0.9 % 100 mL Inject 500 mg into the vein every 12 (twelve) hours.   methylPREDNISolone 4 MG Tbpk tablet Commonly known as:  MEDROL DOSEPAK Use as directed   MULTI-VITAMINS Tabs Take 1 tablet by mouth daily.   SUMAtriptan 100 MG tablet Commonly known as:  IMITREX Take 1 tablet (100 mg total) by mouth once as needed for migraine. May repeat in 2 hours if headache persists or recurs.   verapamil 240 MG CR tablet Commonly known as:  CALAN-SR Take 1 tablet (240 mg total) by mouth at bedtime. What changed:  additional instructions   VITAMIN B-12 PO Take 1 tablet by mouth daily.   XALATAN 0.005 % ophthalmic solution Generic drug:  latanoprost Place 1 drop into both eyes at bedtime.       Major procedures and Radiology Reports - PLEASE review detailed and final reports for all details, in brief -   EEG 03/14/2018 IMPRESSION:Normal electroencephalogram, awake and asleep. There are no focal lateralizing or epileptiform features.    Mr Virgel Paling OE Contrast  Result Date: 03/14/2018 CLINICAL DATA:  Initial evaluation for right-sided numbness, tingling. History of cluster headaches, migraine, and seizure. EXAM: MRI HEAD WITH CONTRAST MRA HEAD WITHOUT CONTRAST MRI CERVICAL SPINE WITHOUT AND WITH CONTRAST MRI THORACIC SPINE WITHOUT AND WITH CONTRAST TECHNIQUE: Multiplanar, multiecho pulse sequences of the brain and surrounding structures were obtained with intravenous contrast. Angiographic images of  the head were obtained using MRA technique with contrast. Multiplanar, multiecho pulse sequences of the cervical  and thoracic spine were obtained without and with intravenous contrast. CONTRAST:  8 cc of Gadavist. COMPARISON:  Prior brain MRI from 03/13/2017. FINDINGS: MRI HEAD FINDINGS Postcontrast imaging through the brain demonstrates patchy enhancement about the previously identified T2/FLAIR signal abnormalities at the left frontal lobe, including the new parasagittal lesion (series 39, images 39, 45, 46). No other abnormal enhancement elsewhere within the brain. MRA HEAD FINDINGS ANTERIOR CIRCULATION: Distal cervical segments of the internal carotid arteries are widely patent with symmetric antegrade flow. Petrous, cavernous, and supraclinoid segments patent without flow-limiting stenosis. A1 segments widely patent. Normal anterior communicating artery. Anterior cerebral arteries well perfused to their distal aspects without stenosis. No M1 stenosis or occlusion. Distal MCA branches well perfused and symmetric. No significant irregularity or beading to suggest vasculitis. POSTERIOR CIRCULATION: Vertebral arteries widely patent to the vertebrobasilar junction without stenosis. Right vertebral artery slightly dominant. Posterior inferior cerebral arteries patent bilaterally. Basilar widely patent to its distal aspect without stenosis. Superior cerebral arteries patent bilaterally. Both posterior cerebral arteries well perfused to their distal aspects without stenosis. Small bilateral posterior communicating arteries noted. No intracranial aneurysm or other abnormality. MRI CERVICAL SPINE FINDINGS Straightening with slight reversal of the normal cervical lordosis. No listhesis or subluxation. Vertebral body heights well maintained without evidence for acute or chronic fracture. Bone marrow signal intensity somewhat diffusely decreased on T1 weighted imaging, most commonly related to anemia, smoking, or obesity.  No discrete or worrisome osseous lesions. No abnormal marrow edema or enhancement. Signal intensity within the cervical spinal cord is normal. No cord signal abnormality or abnormal enhancement. Normal cord caliber and morphology. Craniocervical junction within normal limits. Paraspinous and prevertebral soft tissues unremarkable. Normal intravascular flow voids seen within the vertebral arteries bilaterally. C2-C3: Unremarkable. C3-C4:  Unremarkable. C4-C5: Diffuse circumferential disc bulge with mild intervertebral disc space narrowing. Flattening of the ventral CSF without significant spinal stenosis or cord deformity. Foramina remain patent. C5-C6: Mild circumferential disc osteophyte complex with intervertebral disc space narrowing. Flattening of the ventral thecal sac without significant spinal stenosis. Mild bilateral C6 foraminal narrowing. C6-C7: Mild circumferential disc osteophyte complex, slightly eccentric to the left. Flattening of the ventral thecal sac without significant spinal stenosis. Mild left C7 foraminal narrowing. C7-T1:  Unremarkable. MRI THORACIC SPINE FINDINGS Mild levoscoliosis with accentuation of the normal thoracic kyphosis. Alignment otherwise normal. No listhesis or subluxation. Vertebral body heights maintained without evidence for acute or chronic fracture. Bone marrow signal intensity somewhat diffusely decreased on T1 weighted imaging, most commonly related to anemia, smoking or obesity. Mild reactive endplate changes present about the T6-7 interspace anteriorly. No discrete or worrisome osseous lesions. No other abnormal marrow edema or enhancement. Signal intensity within the thoracic spinal cord is normal. Normal cord caliber and morphology. No cord signal abnormality or abnormal enhancement. Conus medullaris terminates at the L1 level. Paraspinous soft tissues within normal limits. Visualized lungs are clear. Visualized visceral structures unremarkable. No other significant  degenerative changes seen within the thoracic spine. No significant disc bulge or disc protrusion. No canal or foraminal stenosis. IMPRESSION: MRI HEAD IMPRESSION 1. Previously identified T2/FLAIR signal abnormalities within the left frontal lobe demonstrate patchy post-contrast enhancement. Differential as previously described. 2. No other abnormal enhancement elsewhere within the brain. MRA HEAD IMPRESSION Normal intracranial MRA. No large vessel occlusion or hemodynamically significant stenosis. No significant vascular irregularity or beading to suggest vasculitis. MRI CERVICAL SPINE IMPRESSION 1. Normal MRI appearance of the cervical spinal cord. No cord signal abnormality or abnormal enhancement. 2. Mild cervical  spondylolysis at C4-5 through C6-7 without significant spinal stenosis. Mild bilateral C6 and left C7 foraminal narrowing. MRI THORACIC SPINE IMPRESSION 1. Normal MRI appearance of the thoracic spinal cord. No cord signal abnormality or abnormal enhancement. 2. Levoscoliosis without significant degenerative spondylolysis for age. No stenosis. Electronically Signed   By: Jeannine Boga M.D.   On: 03/14/2018 05:44   Mr Brain Wo Contrast  Result Date: 03/13/2018 CLINICAL DATA:  Initial evaluation for acute right-sided numbness with headache. History of cluster headaches/migraine, seizure, prior right-sided visual loss. EXAM: MRI HEAD WITHOUT CONTRAST TECHNIQUE: Multiplanar, multiecho pulse sequences of the brain and surrounding structures were obtained without intravenous contrast. COMPARISON:  Comparison made with prior CT from earlier the same day as well as previous MRIs from 02/14/2016 and 06/30/2015. FINDINGS: Brain: Cerebral volume stable, and within normal limits for age. Again seen are 2 juxta cortical T2/FLAIR hyperintensities involving the anterior left frontal lobe, previously seen on previous exam. The more posterior lesion seen on FLAIR sequence image 18 appears increased in size  from previous, now measuring 2.2 cm in transverse diameter, previously 1.3 cm in diameter. The more anterior lesion is relatively stable in size and appearance. Adjacent T1 hypointense black hole noted adjacent to this lesion. There is a new 13 mm juxta cortical T2/FLAIR lesion at the parasagittal high left frontal lobe (series 11, image 21). No associated restricted diffusion. No other new lesions identified. No lesions within the right cerebral hemisphere or infratentorial brain or brainstem. No other abnormal foci of restricted diffusion to suggest acute or subacute ischemia. Gray-white matter differentiation otherwise maintained. No evidence for acute intracranial hemorrhage. Trace chronic hemosiderin staining noted about 1 of the chronic left frontal FLAIR signal abnormalities (series 12, image 40). No other evidence for acute or chronic intracranial hemorrhage. No other mass lesion, midline shift or mass effect. No hydrocephalus. No extra-axial fluid collection. Pituitary gland within normal limits. Midline structures intact and normal. Vascular: Major intracranial vascular flow voids maintained. Skull and upper cervical spine: Craniocervical junction normal. Upper cervical spine within normal limits. No focal marrow replacing lesion. Scalp soft tissues unremarkable. Sinuses/Orbits: Globes and orbital soft tissues within normal limits. Small right maxillary sinus retention cyst. Mild scattered mucosal thickening within the ethmoidal air cells. No mastoid effusion. Other: None. IMPRESSION: 1. New 13 mm juxta cortical FLAIR signal abnormality at the parasagittal left frontal lobe as above. Previously identified two additional chronic juxta cortical FLAIR hyperintensities again seen, the more posterior of which is slightly more prominent as compared to previous exam. Findings are nonspecific, and could reflect the sequelae of underlying demyelinating disease. An infectious or inflammatory process, or changes  related to vasculitis could also be considered. Subacute ischemia is also possible, but felt to be less likely given the patient's presenting symptoms. Tumor felt to be unlikely given the overall appearance and previously seen waxing and waning enhancement about the pre-existing left frontal lesions. Further assessment with postcontrast imaging could be performed for further evaluation as desired, as there is anticipated to be a small amount of enhancement about the new parasagittal lesion. 2. Otherwise stable normal MRI appearance of the brain. Electronically Signed   By: Jeannine Boga M.D.   On: 03/13/2018 22:40   Mr Brain W Contrast  Result Date: 03/14/2018 CLINICAL DATA:  Initial evaluation for right-sided numbness, tingling. History of cluster headaches, migraine, and seizure. EXAM: MRI HEAD WITH CONTRAST MRA HEAD WITHOUT CONTRAST MRI CERVICAL SPINE WITHOUT AND WITH CONTRAST MRI THORACIC SPINE WITHOUT AND WITH CONTRAST  TECHNIQUE: Multiplanar, multiecho pulse sequences of the brain and surrounding structures were obtained with intravenous contrast. Angiographic images of the head were obtained using MRA technique with contrast. Multiplanar, multiecho pulse sequences of the cervical and thoracic spine were obtained without and with intravenous contrast. CONTRAST:  8 cc of Gadavist. COMPARISON:  Prior brain MRI from 03/13/2017. FINDINGS: MRI HEAD FINDINGS Postcontrast imaging through the brain demonstrates patchy enhancement about the previously identified T2/FLAIR signal abnormalities at the left frontal lobe, including the new parasagittal lesion (series 39, images 39, 45, 46). No other abnormal enhancement elsewhere within the brain. MRA HEAD FINDINGS ANTERIOR CIRCULATION: Distal cervical segments of the internal carotid arteries are widely patent with symmetric antegrade flow. Petrous, cavernous, and supraclinoid segments patent without flow-limiting stenosis. A1 segments widely patent. Normal  anterior communicating artery. Anterior cerebral arteries well perfused to their distal aspects without stenosis. No M1 stenosis or occlusion. Distal MCA branches well perfused and symmetric. No significant irregularity or beading to suggest vasculitis. POSTERIOR CIRCULATION: Vertebral arteries widely patent to the vertebrobasilar junction without stenosis. Right vertebral artery slightly dominant. Posterior inferior cerebral arteries patent bilaterally. Basilar widely patent to its distal aspect without stenosis. Superior cerebral arteries patent bilaterally. Both posterior cerebral arteries well perfused to their distal aspects without stenosis. Small bilateral posterior communicating arteries noted. No intracranial aneurysm or other abnormality. MRI CERVICAL SPINE FINDINGS Straightening with slight reversal of the normal cervical lordosis. No listhesis or subluxation. Vertebral body heights well maintained without evidence for acute or chronic fracture. Bone marrow signal intensity somewhat diffusely decreased on T1 weighted imaging, most commonly related to anemia, smoking, or obesity. No discrete or worrisome osseous lesions. No abnormal marrow edema or enhancement. Signal intensity within the cervical spinal cord is normal. No cord signal abnormality or abnormal enhancement. Normal cord caliber and morphology. Craniocervical junction within normal limits. Paraspinous and prevertebral soft tissues unremarkable. Normal intravascular flow voids seen within the vertebral arteries bilaterally. C2-C3: Unremarkable. C3-C4:  Unremarkable. C4-C5: Diffuse circumferential disc bulge with mild intervertebral disc space narrowing. Flattening of the ventral CSF without significant spinal stenosis or cord deformity. Foramina remain patent. C5-C6: Mild circumferential disc osteophyte complex with intervertebral disc space narrowing. Flattening of the ventral thecal sac without significant spinal stenosis. Mild bilateral C6  foraminal narrowing. C6-C7: Mild circumferential disc osteophyte complex, slightly eccentric to the left. Flattening of the ventral thecal sac without significant spinal stenosis. Mild left C7 foraminal narrowing. C7-T1:  Unremarkable. MRI THORACIC SPINE FINDINGS Mild levoscoliosis with accentuation of the normal thoracic kyphosis. Alignment otherwise normal. No listhesis or subluxation. Vertebral body heights maintained without evidence for acute or chronic fracture. Bone marrow signal intensity somewhat diffusely decreased on T1 weighted imaging, most commonly related to anemia, smoking or obesity. Mild reactive endplate changes present about the T6-7 interspace anteriorly. No discrete or worrisome osseous lesions. No other abnormal marrow edema or enhancement. Signal intensity within the thoracic spinal cord is normal. Normal cord caliber and morphology. No cord signal abnormality or abnormal enhancement. Conus medullaris terminates at the L1 level. Paraspinous soft tissues within normal limits. Visualized lungs are clear. Visualized visceral structures unremarkable. No other significant degenerative changes seen within the thoracic spine. No significant disc bulge or disc protrusion. No canal or foraminal stenosis. IMPRESSION: MRI HEAD IMPRESSION 1. Previously identified T2/FLAIR signal abnormalities within the left frontal lobe demonstrate patchy post-contrast enhancement. Differential as previously described. 2. No other abnormal enhancement elsewhere within the brain. MRA HEAD IMPRESSION Normal intracranial MRA. No large vessel occlusion or  hemodynamically significant stenosis. No significant vascular irregularity or beading to suggest vasculitis. MRI CERVICAL SPINE IMPRESSION 1. Normal MRI appearance of the cervical spinal cord. No cord signal abnormality or abnormal enhancement. 2. 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 Mild bilateral C6 and left C7 foraminal  narrowing. MRI THORACIC SPINE IMPRESSION 1. Normal MRI appearance of the thoracic spinal cord. No cord signal abnormality or abnormal enhancement. 2. Levoscoliosis without significant degenerative spondylolysis for age. No stenosis. Electronically Signed   By: Jeannine Boga M.D.   On: 03/14/2018 05:44   Mr Cervical Spine W Wo Contrast  Result Date: 03/14/2018 CLINICAL DATA:  Initial evaluation for right-sided numbness, tingling. History of cluster headaches, migraine, and seizure. EXAM: MRI HEAD WITH CONTRAST MRA HEAD WITHOUT CONTRAST MRI CERVICAL SPINE WITHOUT AND WITH CONTRAST MRI THORACIC SPINE WITHOUT AND WITH CONTRAST TECHNIQUE: Multiplanar, multiecho pulse sequences of the brain and surrounding structures were obtained with intravenous contrast. Angiographic images of the head were obtained using MRA technique with contrast. Multiplanar, multiecho pulse sequences of the cervical and thoracic spine were obtained without and with intravenous contrast. CONTRAST:  8 cc of Gadavist. COMPARISON:  Prior brain MRI from 03/13/2017. FINDINGS: MRI HEAD FINDINGS Postcontrast imaging through the brain demonstrates patchy enhancement about the previously identified T2/FLAIR signal abnormalities at the left frontal lobe, including the new parasagittal lesion (series 39, images 39, 45, 46). No other abnormal enhancement elsewhere within the brain. MRA HEAD FINDINGS ANTERIOR CIRCULATION: Distal cervical segments of the internal carotid arteries are widely patent with symmetric antegrade flow. Petrous, cavernous, and supraclinoid segments patent without flow-limiting stenosis. A1 segments widely patent. Normal anterior communicating artery. Anterior cerebral arteries well perfused to their distal aspects without stenosis. No M1 stenosis or occlusion. Distal MCA branches well perfused and symmetric. No significant irregularity or beading to suggest vasculitis. POSTERIOR CIRCULATION: Vertebral arteries widely patent to  the vertebrobasilar junction without stenosis. Right vertebral artery slightly dominant. Posterior inferior cerebral arteries patent bilaterally. Basilar widely patent to its distal aspect without stenosis. Superior cerebral arteries patent bilaterally. Both posterior cerebral arteries well perfused to their distal aspects without stenosis. Small bilateral posterior communicating arteries noted. No intracranial aneurysm or other abnormality. MRI CERVICAL SPINE FINDINGS Straightening with slight reversal of the normal cervical lordosis. No listhesis or subluxation. Vertebral body heights well maintained without evidence for acute or chronic fracture. Bone marrow signal intensity somewhat diffusely decreased on T1 weighted imaging, most commonly related to anemia, smoking, or obesity. No discrete or worrisome osseous lesions. No abnormal marrow edema or enhancement. Signal intensity within the cervical spinal cord is normal. No cord signal abnormality or abnormal enhancement. Normal cord caliber and morphology. Craniocervical junction within normal limits. Paraspinous and prevertebral soft tissues unremarkable. Normal intravascular flow voids seen within the vertebral arteries bilaterally. C2-C3: Unremarkable. C3-C4:  Unremarkable. C4-C5: Diffuse circumferential disc bulge with mild intervertebral disc space narrowing. Flattening of the ventral CSF without significant spinal stenosis or cord deformity. Foramina remain patent. C5-C6: Mild circumferential disc osteophyte complex with intervertebral disc space narrowing. Flattening of the ventral thecal sac without significant spinal stenosis. Mild bilateral C6 foraminal narrowing. C6-C7: Mild circumferential disc osteophyte complex, slightly eccentric to the left. Flattening of the ventral thecal sac without significant spinal stenosis. Mild left C7 foraminal narrowing. C7-T1:  Unremarkable. MRI THORACIC SPINE FINDINGS Mild levoscoliosis with accentuation of the normal  thoracic kyphosis. Alignment otherwise normal. No listhesis or subluxation. Vertebral body heights maintained without evidence for acute or chronic fracture. Bone  marrow signal intensity somewhat diffusely decreased on T1 weighted imaging, most commonly related to anemia, smoking or obesity. Mild reactive endplate changes present about the T6-7 interspace anteriorly. No discrete or worrisome osseous lesions. No other abnormal marrow edema or enhancement. Signal intensity within the thoracic spinal cord is normal. Normal cord caliber and morphology. No cord signal abnormality or abnormal enhancement. Conus medullaris terminates at the L1 level. Paraspinous soft tissues within normal limits. Visualized lungs are clear. Visualized visceral structures unremarkable. No other significant degenerative changes seen within the thoracic spine. No significant disc bulge or disc protrusion. No canal or foraminal stenosis. IMPRESSION: MRI HEAD IMPRESSION 1. Previously identified T2/FLAIR signal abnormalities within the left frontal lobe demonstrate patchy post-contrast enhancement. Differential as previously described. 2. No other abnormal enhancement elsewhere within the brain. MRA HEAD IMPRESSION Normal intracranial MRA. No large vessel occlusion or hemodynamically significant stenosis. No significant vascular irregularity or beading to suggest vasculitis. MRI CERVICAL SPINE IMPRESSION 1. Normal MRI appearance of the cervical spinal cord. No cord signal abnormality or abnormal enhancement. 2. 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 Mild bilateral C6 and left C7 foraminal narrowing. MRI THORACIC SPINE IMPRESSION 1. Normal MRI appearance of the thoracic spinal cord. No cord signal abnormality or abnormal enhancement. 2. Levoscoliosis without significant degenerative spondylolysis for age. No stenosis. Electronically Signed   By: Jeannine Boga M.D.   On: 03/14/2018 05:44   Mr  Thoracic Spine W Wo Contrast  Result Date: 03/14/2018 CLINICAL DATA:  Initial evaluation for right-sided numbness, tingling. History of cluster headaches, migraine, and seizure. EXAM: MRI HEAD WITH CONTRAST MRA HEAD WITHOUT CONTRAST MRI CERVICAL SPINE WITHOUT AND WITH CONTRAST MRI THORACIC SPINE WITHOUT AND WITH CONTRAST TECHNIQUE: Multiplanar, multiecho pulse sequences of the brain and surrounding structures were obtained with intravenous contrast. Angiographic images of the head were obtained using MRA technique with contrast. Multiplanar, multiecho pulse sequences of the cervical and thoracic spine were obtained without and with intravenous contrast. CONTRAST:  8 cc of Gadavist. COMPARISON:  Prior brain MRI from 03/13/2017. FINDINGS: MRI HEAD FINDINGS Postcontrast imaging through the brain demonstrates patchy enhancement about the previously identified T2/FLAIR signal abnormalities at the left frontal lobe, including the new parasagittal lesion (series 39, images 39, 45, 46). No other abnormal enhancement elsewhere within the brain. MRA HEAD FINDINGS ANTERIOR CIRCULATION: Distal cervical segments of the internal carotid arteries are widely patent with symmetric antegrade flow. Petrous, cavernous, and supraclinoid segments patent without flow-limiting stenosis. A1 segments widely patent. Normal anterior communicating artery. Anterior cerebral arteries well perfused to their distal aspects without stenosis. No M1 stenosis or occlusion. Distal MCA branches well perfused and symmetric. No significant irregularity or beading to suggest vasculitis. POSTERIOR CIRCULATION: Vertebral arteries widely patent to the vertebrobasilar junction without stenosis. Right vertebral artery slightly dominant. Posterior inferior cerebral arteries patent bilaterally. Basilar widely patent to its distal aspect without stenosis. Superior cerebral arteries patent bilaterally. Both posterior cerebral arteries well perfused to their distal  aspects without stenosis. Small bilateral posterior communicating arteries noted. No intracranial aneurysm or other abnormality. MRI CERVICAL SPINE FINDINGS Straightening with slight reversal of the normal cervical lordosis. No listhesis or subluxation. Vertebral body heights well maintained without evidence for acute or chronic fracture. Bone marrow signal intensity somewhat diffusely decreased on T1 weighted imaging, most commonly related to anemia, smoking, or obesity. No discrete or worrisome osseous lesions. No abnormal marrow edema or enhancement. Signal intensity within the cervical spinal cord is normal. No cord signal abnormality  or abnormal enhancement. Normal cord caliber and morphology. Craniocervical junction within normal limits. Paraspinous and prevertebral soft tissues unremarkable. Normal intravascular flow voids seen within the vertebral arteries bilaterally. C2-C3: Unremarkable. C3-C4:  Unremarkable. C4-C5: Diffuse circumferential disc bulge with mild intervertebral disc space narrowing. Flattening of the ventral CSF without significant spinal stenosis or cord deformity. Foramina remain patent. C5-C6: Mild circumferential disc osteophyte complex with intervertebral disc space narrowing. Flattening of the ventral thecal sac without significant spinal stenosis. Mild bilateral C6 foraminal narrowing. C6-C7: Mild circumferential disc osteophyte complex, slightly eccentric to the left. Flattening of the ventral thecal sac without significant spinal stenosis. Mild left C7 foraminal narrowing. C7-T1:  Unremarkable. MRI THORACIC SPINE FINDINGS Mild levoscoliosis with accentuation of the normal thoracic kyphosis. Alignment otherwise normal. No listhesis or subluxation. Vertebral body heights maintained without evidence for acute or chronic fracture. Bone marrow signal intensity somewhat diffusely decreased on T1 weighted imaging, most commonly related to anemia, smoking or obesity. Mild reactive endplate  changes present about the T6-7 interspace anteriorly. No discrete or worrisome osseous lesions. No other abnormal marrow edema or enhancement. Signal intensity within the thoracic spinal cord is normal. Normal cord caliber and morphology. No cord signal abnormality or abnormal enhancement. Conus medullaris terminates at the L1 level. Paraspinous soft tissues within normal limits. Visualized lungs are clear. Visualized visceral structures unremarkable. No other significant degenerative changes seen within the thoracic spine. No significant disc bulge or disc protrusion. No canal or foraminal stenosis. IMPRESSION: MRI HEAD IMPRESSION 1. Previously identified T2/FLAIR signal abnormalities within the left frontal lobe demonstrate patchy post-contrast enhancement. Differential as previously described. 2. No other abnormal enhancement elsewhere within the brain. MRA HEAD IMPRESSION Normal intracranial MRA. No large vessel occlusion or hemodynamically significant stenosis. No significant vascular irregularity or beading to suggest vasculitis. MRI CERVICAL SPINE IMPRESSION 1. Normal MRI appearance of the cervical spinal cord. No cord signal abnormality or abnormal enhancement. 2. 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 Mild bilateral C6 and left C7 foraminal narrowing. MRI THORACIC SPINE IMPRESSION 1. Normal MRI appearance of the thoracic spinal cord. No cord signal abnormality or abnormal enhancement. 2. Levoscoliosis without significant degenerative spondylolysis for age. No stenosis. Electronically Signed   By: Jeannine Boga M.D.   On: 03/14/2018 05:44   Ct Head Code Stroke Wo Contrast  Result Date: 03/13/2018 CLINICAL DATA:  Code stroke. Acute onset of right-sided facial droop and weakness began 45 minutes ago. EXAM: CT HEAD WITHOUT CONTRAST TECHNIQUE: Contiguous axial images were obtained from the base of the skull through the vertex without intravenous contrast.  COMPARISON:  MRI brain 02/14/2016 FINDINGS: Brain: Remote high left frontal lobe infarct is again seen on image 25. No acute infarct, hemorrhage, or mass lesion is present. No significant white matter lesions are present. Basal ganglia are intact. Insular ribbon is normal The brainstem and cerebellum are within normal limits. The ventricles are of normal size. No significant extraaxial fluid collection is present. Vascular: No hyperdense vessel or unexpected calcification. Skull: Calvarium is intact. No focal lytic or blastic lesions are present. Sinuses/Orbits: A polyp or mucous retention cyst is again noted in the posterior right maxillary sinus. The paranasal sinuses and mastoid air cells are otherwise clear. The globes and orbits are within normal limits. ASPECTS [facility] Stroke Program Early CT Score) - Ganglionic level infarction (caudate, lentiform nuclei, internal capsule, insula, M1-M3 cortex): 7/7 - Supraganglionic infarction (M4-M6 cortex): 3/3 Total score (0-10 with 10 being normal): 10/10 IMPRESSION: 1. No acute  intracranial abnormality. 2. Stable remote left frontal lobe infarct. 3. ASPECTS is 10/10 The above was relayed via text pager to Dr. Lorraine Lax on 03/13/2018 at 16:54 . Electronically Signed   By: San Morelle M.D.   On: 03/13/2018 16:55   Vas US Carotid  Result Date: 03/14/2018 Carotid Arterial Duplex Study Indications: CVA. Limitations: patient anatomy, patient talking Performing Technologist: Oliver Hum RVT  Examination Guidelines: A complete evaluation includes B-mode imaging, spectral Doppler, color Doppler, and power Doppler as needed of all accessible portions of each vessel. Bilateral testing is considered an integral part of a complete examination. Limited examinations for reoccurring indications may be performed as noted.  Right Carotid Findings: +----------+--------+--------+--------+-----------------------+--------+           PSV cm/sEDV cm/sStenosisDescribe                Comments +----------+--------+--------+--------+-----------------------+--------+ CCA Prox  101     18              smooth and heterogenous         +----------+--------+--------+--------+-----------------------+--------+ CCA Distal85      19              smooth and heterogenous         +----------+--------+--------+--------+-----------------------+--------+ ICA Prox  64      14                                              +----------+--------+--------+--------+-----------------------+--------+ ICA Distal58      21                                     tortuous +----------+--------+--------+--------+-----------------------+--------+ ECA       135     24                                     tortuous +----------+--------+--------+--------+-----------------------+--------+ +----------+--------+-------+--------+-------------------+           PSV cm/sEDV cmsDescribeArm Pressure (mmHG) +----------+--------+-------+--------+-------------------+ [id number]                                        +----------+--------+-------+--------+-------------------+ +---------+--------+--+--------+--+---------+ VertebralPSV cm/s56EDV cm/s16Antegrade +---------+--------+--+--------+--+---------+  Left Carotid Findings: +----------+--------+--------+--------+-----------------------+--------+           PSV cm/sEDV cm/sStenosisDescribe               Comments +----------+--------+--------+--------+-----------------------+--------+ CCA Prox  92      14              smooth and heterogenous         +----------+--------+--------+--------+-----------------------+--------+ CCA Distal96      19              smooth and heterogenous         +----------+--------+--------+--------+-----------------------+--------+ ICA Prox  47      15              smooth and heterogenous         +----------+--------+--------+--------+-----------------------+--------+ ICA Distal47       19  tortuous +----------+--------+--------+--------+-----------------------+--------+ ECA       68      7                                               +----------+--------+--------+--------+-----------------------+--------+ +----------+--------+--------+--------+-------------------+ SubclavianPSV cm/sEDV cm/sDescribeArm Pressure (mmHG) +----------+--------+--------+--------+-------------------+           160                                         +----------+--------+--------+--------+-------------------+ +---------+--------+--+--------+--+---------+ VertebralPSV cm/s47EDV cm/s11Antegrade +---------+--------+--+--------+--+---------+  Summary: Right Carotid: Velocities in the right ICA are consistent with a 1-39% stenosis. Left Carotid: Velocities in the left ICA are consistent with a 1-39% stenosis. Vertebrals: Bilateral vertebral arteries demonstrate antegrade flow. *See table(s) above for measurements and observations.  Electronically signed by Antony Contras MD on 03/14/2018 at 4:22:08 PM.    Final    Dg Fluoro Guide Lumbar Puncture  Result Date: 03/15/2018 CLINICAL DATA:  43 year old female with history of unilateral weakness. Evaluate for potential multiple sclerosis. EXAM: DIAGNOSTIC LUMBAR PUNCTURE UNDER FLUOROSCOPIC GUIDANCE FLUOROSCOPY TIME:  Fluoroscopy Time:  1 minutes Radiation Exposure Index (if provided by the fluoroscopic device): 6.4 mGy PROCEDURE: Informed consent was obtained from the patient prior to the procedure, including potential complications of headache, allergy, and pain. With the patient prone, the lower back was prepped with Betadine. 1% Lidocaine was used for local anesthesia. Lumbar puncture was performed at the L2-L3 level using a 20 gauge needle with return of clear CSF with an opening pressure of 21 cm water. 12 ml of CSF were obtained for laboratory studies. The patient tolerated the procedure well and there were  no apparent complications. IMPRESSION: 1. Successful fluoroscopic guided lumbar puncture, as above. Electronically Signed   By: Vinnie Langton M.D.   On: 03/15/2018 11:05    Micro Results     Recent Results (from the past 240 hour(s))  CSF culture     Status: None (Preliminary result)   Collection Time: 03/15/18 10:59 AM  Result Value Ref Range Status   Specimen Description CSF  Final   Special Requests NONE  Final   Gram Stain   Final    CYTOSPIN SMEAR WBC PRESENT, PREDOMINANTLY MONONUCLEAR NO ORGANISMS SEEN    Culture   Final    NO GROWTH < 24 HOURS Performed at [facility], [address] [place], [place] [address]    Report Status PENDING  Incomplete       Today   Subjective    Angel Rodriguez today has no headache and feels ready to be discharged home.   Objective   Blood pressure (!) 117/54, pulse (!) 59, temperature 97.7 F (36.5 C), temperature source Oral, resp. rate 18, height 5' (1.524 m), weight 78.1 kg, last menstrual period 05/02/2017, SpO2 96 %.  No intake or output data in the 24 hours ending 03/17/18 0403  Exam  Constitutional: NAD, calm, comfortable Eyes: PERRL, lids and conjunctivae normal ENMT: Mucous membranes are moist. Posterior pharynx clear of any exudate or lesions.Normal dentition.  Neck: normal, supple, no masses, no thyromegaly Respiratory: clear to auscultation bilaterally, no wheezing, no crackles. Normal respiratory effort. No accessory muscle use.  Cardiovascular: Regular rate and rhythm, no murmurs / rubs / gallops. No extremity edema. 2+ pedal  pulses. No carotid bruits.  Abdomen: no tenderness, no masses palpated. No hepatosplenomegaly. Bowel sounds positive.  Musculoskeletal: no clubbing / cyanosis. No joint deformity upper and lower extremities. Good ROM, no contractures. Normal muscle tone.  Skin: no rashes, lesions, ulcers. No induration Neurologic: CN 2-12 grossly intact. Sensation intact, DTR normal. Strength 5/5 in  all 4.  Psychiatric: Normal judgment and insight. Alert and oriented x 3. Normal mood.    Data Review   CBC w Diff:  Lab Results  Component Value Date   WBC 8.4 03/14/2018   HGB 13.3 03/14/2018   HGB 13.7 08/31/2016   HCT 40.0 03/14/2018   HCT 40.2 08/31/2016   PLT 67 (L) 03/14/2018   PLT 71 (L) 08/31/2016   LYMPHOPCT 34 07/12/2017   LYMPHOPCT 38.0 08/31/2016   MONOPCT 5 07/12/2017   MONOPCT 5.3 08/31/2016   EOSPCT 2 07/12/2017   EOSPCT 2.0 08/31/2016   BASOPCT 0 07/12/2017   BASOPCT 0.2 08/31/2016    CMP:  Lab Results  Component Value Date   NA 138 03/15/2018   NA 138 08/16/2017   NA 138 08/31/2016   K 3.8 03/15/2018   K 3.9 08/31/2016   CL 107 03/15/2018   CO2 21 (L) 03/15/2018   CO2 22 08/31/2016   BUN 13 03/15/2018   BUN 8 08/16/2017   BUN 8.6 08/31/2016   CREATININE 0.77 03/15/2018   CREATININE 0.8 08/31/2016   PROT 7.5 03/14/2018   PROT 7.2 08/16/2017   PROT 7.6 08/31/2016   ALBUMIN 4.0 03/14/2018   ALBUMIN 4.6 08/16/2017   ALBUMIN 3.9 08/31/2016   BILITOT 0.5 03/14/2018   BILITOT 0.3 08/16/2017   BILITOT 0.43 08/31/2016   ALKPHOS 53 03/14/2018   ALKPHOS 62 08/31/2016   AST 28 03/14/2018   AST 18 08/31/2016   ALT 26 03/14/2018   ALT 23 08/31/2016  .   Total Time in preparing paper work, data evaluation and todays exam - 35 minutes  Norval Morton M.D on 03/17/2018 at 4:03 AM  [facility]   Office  [phone]

## 2018-03-18 ENCOUNTER — Ambulatory Visit: Payer: PRIVATE HEALTH INSURANCE | Admitting: Neurology

## 2018-03-18 ENCOUNTER — Telehealth: Payer: Self-pay | Admitting: *Deleted

## 2018-03-18 ENCOUNTER — Encounter: Payer: Self-pay | Admitting: Neurology

## 2018-03-18 VITALS — BP 112/75 | HR 80 | Ht 60.0 in | Wt 176.0 lb

## 2018-03-18 DIAGNOSIS — H348112 Central retinal vein occlusion, right eye, stable: Secondary | ICD-10-CM | POA: Diagnosis not present

## 2018-03-18 DIAGNOSIS — G441 Vascular headache, not elsewhere classified: Secondary | ICD-10-CM

## 2018-03-18 DIAGNOSIS — E559 Vitamin D deficiency, unspecified: Secondary | ICD-10-CM

## 2018-03-18 DIAGNOSIS — G939 Disorder of brain, unspecified: Secondary | ICD-10-CM

## 2018-03-18 DIAGNOSIS — R200 Anesthesia of skin: Secondary | ICD-10-CM | POA: Insufficient documentation

## 2018-03-18 LAB — CSF CULTURE W GRAM STAIN: Culture: NO GROWTH

## 2018-03-18 LAB — VDRL, CSF: VDRL Quant, CSF: NONREACTIVE

## 2018-03-18 MED ORDER — ACETAMINOPHEN-CODEINE #3 300-30 MG PO TABS: ORAL_TABLET | ORAL | 0 refills | Status: DC

## 2018-03-18 MED ORDER — BUSPIRONE HCL 15 MG PO TABS: 15.0000 mg | ORAL_TABLET | Freq: Two times a day (BID) | ORAL | 5 refills | Status: DC

## 2018-03-18 NOTE — Telephone Encounter (Signed)
Called pt. Scheduled work in appt for today at 11:30 with pt. Advised her to check in at 11am. She verbalized understanding and appreciation for call.

## 2018-03-18 NOTE — Progress Notes (Signed)
[facility]  PATIENT: Angel Rodriguez DOB: 12/09/1975  REFERRING DOCTOR OR PCP:   SOURCE: patient, ED records, images on PACS, reports in EMR  _________________________________   HISTORICAL  CHIEF COMPLAINT:  Chief Complaint  Patient presents with  . Follow-up    RM 13, alone. Last seen 02/05/18.     HISTORY OF PRESENT ILLNESS:  Angel Rodriguez is a 43 y.o. woman with frequent headaches and h/o seizure.  Update 03/18/2018: She was admitted to the hospital last week after presenting with right hand numbness, weakness and clumsiness.  She called a work acquaintance during the visit to describe her symptoms.  Although she seemed dazed while the symptoms were occurring before she left to go to the hospital, she did not have any generalized tonic-clonic activity.  She was less responsive but was able to understand what was being said to her and was communicating.  Numbness spread up over ten minutes to the entire leg and then was followed by the rest of the right side, including the face.   She had a mild headache but less severe than many of the headaches she has had in the past.      Notes from her hospital stay and multiple laboratory results and imaging studies were reviewed.  There was a concern that she might of had a seizure and she was started on Keppra.  She had imaging performed last week.  I personally reviewed the MRI images on PACS.    She has the 2 foci seen in 2017 in the left frontal subcortical white matter but the one focus has some enhancement and is larger.  The other is unchanged.   There is a newer midline subcortical focus, also on the left that enhances.  No spinal plaques.  She also had an LP.  Cells were mildly elevated (10 WBC  89% lymphs; 11% mono) and protein was normal.   Glucose was slightly elevated at 84.  OCB is pending.  VDRL is pending.    She was given IV Solumedrol and is now on an oral taper  10 years ago, she had a central retinal vein  occlusion on the right.   In 2016, she had a seizure.  MRI showed 2 moderate size nonenhancing subcortical foci in the left frontal lobe.   A repeat MRI 02/14/2016 showed 2 juxtacortical left frontal lobe lesions.  Both were present on the 06/30/2015 MRI.  There was punctate enhancement adjacent to the more anterior frontal focus that had not been present on the previous MRI.  Diffusion weighted images were normal.  Between 2017 and 2019, she was seen multiple times for right-sided headaches.  Intranasal sphenopalatine ganglion block or up of rapid benefit.  Additionally verapamil seem to reduce the frequency.  Update 02/05/2018: She reports that headaches are back. They occur daily with migrainous features of nausea and photophobia/phonophobia.   They returned over a month ago.    She also notes that the right eye becomes red.     She feels stress worsens the headaches.     Movements worsen them.    She is taking Imitrex with benefit but headaches return.       She is on verapamil and that seemed to help her some for a while.     Headaches occur 30/30 of the last days and for > 4 hours/day.       Last sphenocath procedure was April 2019 and it knocked headaches out completely x 1 month  and then they returned mildly.     She has a h/o central retinal vein occlusion and is on drops ([facility])  She had a single seizure in March 2016 and is not on an anticonvulsant.    Insomnia is doing well on her medications.     Update 10/25/2017: Her headaches are doing better since the sphenocath procedure and the trigger point injections.    She still has some headache pain but it is not severe.    Pain sometimes builds up some as the day goes on, especially at work.     She denies any numbness, weakness or gait change.  She sees [facility] for her h/o central retinal vein occlusion.  She is still on eye drops.      She only had one seizure 3 years ago and is not on any anticonvulsant.       She has some  insomnia.    She has trouble falling asleep and staying asleep.   She changed her clonazepma to 0.25 qAM and 0.75 mg qPM.  She has ovarian cancer and had surgery.   She had ovarian torsion but pathology showed possible cancer so she is going to have another operation.      Update 06/27/2017: HA's worsened again last week.    The conjunctivitis returned the next day.   All the pain is on her right side only.     In the past sphenopalatine ganglion blocks have helped the most.    She also has received some benefit form ONB/TPIs in the past.    She denies any additional seizures.      Insomnia was doing better and was helped by clonazepam.  However, she is doing worse since the headache has recurred.  Clonazepam also helps her anxiety.   Update 04/20/2017: Headaches are doing fairly well.   She is now having 3-4 HA's a month.    Verapamil has helped reduce the frequency of headaches.     When needed, Imitrex often helps.     She will sometimes try Tylenol first.   Due to low platelets, she can't take NSAIDs.     The TCPenia was felt to be due to fibroids and these are being treated.     She still gets redness in the right eye, not always associated with HA.    In the past when her headaches were more severe, she had the Sphenocath procedure and it was very beneficial.    Occipital nerve blocks or TPI's have also been beneficial in the past.    She has not had any more seizure.   She just had one in 2016   From 07/11/2016: HA:   Her headaches were doing much better but they are have been occurring more the past month or two. Additionally she continues to have conjunctival redness on the right.     They greatly improved after starting Verapamil and having occipital nerve block/splenius capitis muscle injection at the first visit and then [place] at the second, third and fourth visits.  After the second Sphenocath, the conjunctival redness got much better but then returned. After the fourth procedure,  she went nearly pain-free for 6 months until milder headaches returned and activities slowly worsening. Pain improves  with Imitrex and rest much of the time.  She no longer can take NSAID    She tolerates the verapamil well but has occasional constipation .     Studies:   MRI of the  brain showed a single small T2/FLAIR hyperintense focus in the left frontal subcortical white matter. The MR venogram was normal. ESR and ANA were normal.    Mood:  Anxiety is much better.   Clonazepam with Buspar has helped better than the escitalopram.    Seizure:   In March 2016, she had a seizure.   She had an aura of numbness in her right hand and then passed out.   She is not sure there was any generalized tonic-clonic activity. Her children did not tell her that she had any. However, she did have urinary incontinence. She does not remember much that day.    Since then, she has not had any more seizures.  She has not needed any AED as she had only the one seizure/spell.       Insomnia:   She is sleeping better since starting clonazepam and having much less headache  Thrombocytopenia:   Her platelet count was reduced to 78,000 and she saw her doctor in hematology. Repeat platelet count was 83,000. She was advised to stop anti-inflammatories.  REVIEW OF SYSTEMS: Constitutional: No fevers, chills, sweats, or change in appetite.  She has insomnia. Eyes: as above Ear, nose and throat: No hearing loss, ear pain, nasal congestion, sore throat Cardiovascular: No chest pain, palpitations Respiratory: No shortness of breath at rest or with exertion.   No wheezes GastrointestinaI: No nausea, vomiting, diarrhea, abdominal pain, fecal incontinence Genitourinary: No dysuria, urinary retention or frequency.  No nocturia.   Possible ovarian cancer Musculoskeletal: No neck pain, back pain Integumentary: No rash, pruritus, skin lesions Neurological: as above Psychiatric: No depression at this time.  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 Allergic/Immunologic: No itchy/runny eyes, nasal congestion, recent allergic reactions, rashes  ALLERGIES: Allergies  Allergen Reactions  . Ivp Dye [Iodinated Diagnostic Agents] Hives, Itching and Swelling  . Bee Venom Swelling    HOME MEDICATIONS:  Current Outpatient Medications:  .  acetaminophen-codeine (TYLENOL #3) 300-30 MG tablet, TAKE 1 TO 2 TABS BY MOUTH q 8 hrs prn severe headache.  No more than 6 in any day., Disp: 30 tablet, Rfl: 0 .  atropine 1 % ophthalmic solution, Place 1 drop into the right eye 3 (three) times daily. , Disp: , Rfl:  .  bisacodyl (DULCOLAX) 5 MG EC tablet, Take 1 tablet (5 mg total) by mouth daily as needed for moderate constipation., Disp: 30 tablet, Rfl: 0 .  brimonidine (ALPHAGAN P) 0.1 % SOLN, Place 1 drop into the right eye 3 (three) times daily. , Disp: , Rfl:  .  calcium carbonate (TUMS EX) 750 MG chewable tablet, Chew 2 tablets by mouth as needed for heartburn., Disp: , Rfl:  .  clonazePAM (KLONOPIN) 0.5 MG tablet, Take 1 tablet (0.5 mg total) by mouth 2 (two) times daily as needed. for anxiety, Disp: 180 tablet, Rfl: 1 .  Cyanocobalamin (VITAMIN B-12 PO), Take 1 tablet by mouth daily. , Disp: , Rfl:  .  dorzolamide-timolol (COSOPT) 22.3-6.8 MG/ML ophthalmic solution, Place 1 drop into the right eye 2 (two) times daily. , Disp: , Rfl:  .  FLUoxetine (PROZAC) 20 MG capsule, Take 1 capsule (20 mg total) by mouth daily. (Patient taking differently: Take 20 mg by mouth daily as needed (anxiety). ), Disp: 90 capsule, Rfl: 2 .  fluticasone (FLONASE) 50 MCG/ACT  nasal spray, Place 1-2 sprays into both nostrils daily as needed for allergies. , Disp: , Rfl:  .  latanoprost (XALATAN) 0.005 % ophthalmic solution, Place 1 drop into both  eyes at bedtime., Disp: , Rfl:  .  levETIRAcetam 500 mg in sodium chloride 0.9 % 100 mL, Inject 500 mg into the vein every 12 (twelve) hours., Disp: 60 capsule, Rfl: 0 .  methylPREDNISolone (MEDROL DOSEPAK) 4 MG TBPK tablet, Use as directed, Disp: 21 tablet, Rfl: 0 .  Multiple Vitamin (MULTI-VITAMINS) TABS, Take 1 tablet by mouth daily. , Disp: , Rfl:  .  SUMAtriptan (IMITREX) 100 MG tablet, Take 1 tablet (100 mg total) by mouth once as needed for migraine. May repeat in 2 hours if headache persists or recurs., Disp: 18 tablet, Rfl: 3 .  verapamil (CALAN-SR) 240 MG CR tablet, Take 1 tablet (240 mg total) by mouth at bedtime. (Patient taking differently: Take 240 mg by mouth at bedtime. For migraines), Disp: 90 tablet, Rfl: 3 .  busPIRone (BUSPAR) 15 MG tablet, Take 1 tablet (15 mg total) by mouth 2 (two) times daily., Disp: 60 tablet, Rfl: 5  PAST MEDICAL HISTORY: Past Medical History:  Diagnosis Date  . Anxiety   . 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 Cluster headaches    started after seizure and migraines  . Complication of anesthesia    trouble with short term memory after surgeries  . Depression   . Fibroids    s/p TLH, bilateral salpingectomy  . GERD (gastroesophageal reflux disease)    patient thinks due to medications  . History of anemia    prior to hysterectomy  . History of pneumonia   . History of trichomoniasis   . Seizures ([place]) x 1 2-3 yrs ago none since   once saw neurologist, full body brain MRI and another 6 months lster  . Status post right oophorectomy   . Thrombocytopenia ([place])    sees [place] with Heme Onc    PAST SURGICAL HISTORY: Past Surgical History:  Procedure Laterality Date  . CYSTOSCOPY N/A 07/31/2017   Procedure: CYSTOSCOPY;  Surgeon: Megan Salon, MD;  Location: [facility];  Service: Gynecology;  Laterality: N/A;  . ENDOMETRIAL ABLATION    . EYE SURGERY     laser - removed blood from right eye  . HYSTEROSCOPY W/D&C    . LAPAROSCOPY N/A  09/19/2017   Procedure: LAPAROSCOPY DIAGNOSTIC WITH RIGHT OOPHERECTOMY, LYSIS OF ADHESIONS;  Surgeon: Salvadore Dom, MD;  Location: [facility];  Service: Gynecology;  Laterality: N/A;  . OMENTECTOMY N/A 11/21/2017   Procedure: OMENTECTOMY AND PELVIC WASHINGS;  Surgeon: Isabel Caprice, MD;  Location: [facility];  Service: Gynecology;  Laterality: N/A;  . ROBOTIC ASSISTED SALPINGO OOPHERECTOMY Left 11/21/2017   Procedure: XI ROBOTIC ASSISTED LEFT SALPINGO OOPHORECTOMY;  Surgeon: Isabel Caprice, MD;  Location: [facility];  Service: Gynecology;  Laterality: Left;  . TOTAL LAPAROSCOPIC HYSTERECTOMY WITH SALPINGECTOMY Bilateral 07/31/2017   Procedure: TOTAL LAPAROSCOPIC HYSTERECTOMY WITH SALPINGECTOMY;  Surgeon: Megan Salon, MD;  Location: [facility];  Service: Gynecology;  Laterality: Bilateral;  20 week size uterus/ Alexis bag in room/ need 4.5 hours  . TUBAL LIGATION    . WISDOM TOOTH EXTRACTION      FAMILY HISTORY: Family History  Problem Relation Age of Onset  . Alcoholism Father   . Cirrhosis Father   . Cancer Maternal Grandmother        unsure of type; died of other causes  .  Heart disease Maternal Aunt     SOCIAL HISTORY:  Social History   Socioeconomic History  . Marital status: Single    Spouse name: Not on file  . Number of children: Not on file  . Years of education: Not on file  . Highest education level: Not on file  Occupational History  . Not on file  Social Needs  . Financial resource strain: Not on file  . Food insecurity:    Worry: Not on file    Inability: Not on file  . Transportation needs:    Medical: Not on file    Non-medical: Not on file  Tobacco Use  . Smoking status: Current Some Day Smoker    Years: 20.00    Types: E-cigarettes, Cigarettes  . Smokeless tobacco: Never Used  . Tobacco comment: occasional cigerate and vap  Substance and Sexual Activity  . Alcohol use: Yes    Comment: occ wine  . Drug use: No  . Sexual activity: Yes    Birth  control/protection: Surgical    Comment: hysterectomy  Lifestyle  . Physical activity:    Days per week: Not on file    Minutes per session: Not on file  . Stress: Not on file  Relationships  . Social connections:    Talks on phone: Not on file    Gets together: Not on file    Attends religious service: Not on file    Active member of club or organization: Not on file    Attends meetings of clubs or organizations: Not on file    Relationship status: Not on file  . Intimate partner violence:    Fear of current or ex partner: Not on file    Emotionally abused: Not on file    Physically abused: Not on file    Forced sexual activity: Not on file  Other Topics Concern  . Not on file  Social History Narrative  . Not on file     PHYSICAL EXAM  Vitals:   03/18/18 1142  BP: 112/75  Pulse: 80  Weight: 176 lb (79.8 kg)  Height: 5' (1.524 m)    Body mass index is 34.37 kg/m.   General: The patient is well-developed and well-nourished and in no acute distress  Eyes:   There is mild conjunctival erythema on the right..  Normal funduscopic examination.  Neck: The neck is tender over the right occiput and splenius capitis muscle.  Range of motion is fairly normal   Neurologic Exam  Mental status: The patient is alert and oriented x 3 at the time of the examination. The patient has apparent normal recent and remote memory, with an apparently normal attention span and concentration ability.   Speech is normal.  Cranial nerves: Extraocular movements are full.  She has very mild right ptosis and facial strength is normal elsewhere.  There is normal facial sensation to soft touch.   Trapezius and sternocleidomastoid strength is normal. No dysarthria is noted.  The tongue is midline, and the patient has symmetric elevation of the soft palate. No obvious hearing deficits are noted.  Motor:  Muscle bulk is normal.   Tone is normal. Strength is  5 / 5 in all 4 extremities.   Sensory:  She has normal sensation to touch and vibration in the arms and legs   Gait and station: Station is normal.   Gait and the tandem gait are normal.  Romberg is negative.    DIAGNOSTIC DATA (LABS, IMAGING, TESTING) -  I reviewed patient records, labs, notes, testing and imaging myself where available.  Lab Results  Component Value Date   WBC 8.4 03/14/2018   HGB 13.3 03/14/2018   HCT 40.0 03/14/2018   MCV 89.1 03/14/2018   PLT 67 (L) 03/14/2018      Component Value Date/Time   NA 138 03/15/2018 0634   NA 138 08/16/2017 1627   NA 138 08/31/2016 0957   K 3.8 03/15/2018 0634   K 3.9 08/31/2016 0957   CL 107 03/15/2018 0634   CO2 21 (L) 03/15/2018 0634   CO2 22 08/31/2016 0957   GLUCOSE 123 (H) 03/15/2018 0634   GLUCOSE 106 08/31/2016 0957   BUN 13 03/15/2018 0634   BUN 8 08/16/2017 1627   BUN 8.6 08/31/2016 0957   CREATININE 0.77 03/15/2018 0634   CREATININE 0.8 08/31/2016 0957   CALCIUM 10.0 03/15/2018 0634   CALCIUM 10.3 08/31/2016 0957   PROT 7.5 03/14/2018 0316   PROT 7.2 08/16/2017 1627   PROT 7.6 08/31/2016 0957   ALBUMIN 4.0 03/14/2018 0316   ALBUMIN 4.6 08/16/2017 1627   ALBUMIN 3.9 08/31/2016 0957   AST 28 03/14/2018 0316   AST 18 08/31/2016 0957   ALT 26 03/14/2018 0316   ALT 23 08/31/2016 0957   ALKPHOS 53 03/14/2018 0316   ALKPHOS 62 08/31/2016 0957   BILITOT 0.5 03/14/2018 0316   BILITOT 0.3 08/16/2017 1627   BILITOT 0.43 08/31/2016 0957   GFRNONAA >60 03/15/2018 0634   GFRAA >60 03/15/2018 0634           ASSESSMENT AND PLAN  Brain lesion - Plan: Pan-ANCA, ANA w/Reflex  Vascular headache - Plan: Pan-ANCA, ANA w/Reflex  Central retinal vein occlusion of right eye, unspecified complication status - Plan: Pan-ANCA, ANA w/Reflex  Numbness - Plan: Pan-ANCA, ANA w/Reflex  Vitamin D deficiency - Plan: VITAMIN D 25 Hydroxy (Vit-D Deficiency, Fractures)    1.   She has an unusual presentation --- in 2017 had 2 MRI lesions.   Current MRI now  shows enhancement of these lesions (one stable size, one enlarged) plus one more enhancing subcortical lesion.   Over the last several years has had multiple vascular headaches with right conjunctival erythrema, helped by sphenopalatine blocks.    2.   I'm most concerned about vasculitis (also had a central retinal occulsion a couple years ago)   We will check ANCA and ANA.    Await CSF results.  Check Vit D.    4.   Clonazepam 0.5 mg twice a day for anxiety and insomnia.   Continue fluoxetine.  Add BuSpar 5.   I discussed with her that I do not have an explanation for her unusual imaging results, especially enhancement in the foci now that had not enhance in 2017.   I would like her to have a second opinion with a neuro immunologist as I feel vasculitis is the most likely . 6.    I do not think that she experienced a seizure last week though she appears to have had one in 2016.  For now she will continue the [place].  We will let her know the results of the lab work and the rest of the CSF studies when the results return. 7.   she will return in 4 months or sooner if there are new or worsening symptoms.  45-minute face-to-face evaluation with greater than half of the time counseling and coordinating care with her regarding the recent hospitalization and discussing differential diagnosis and  next steps.  Fender Herder A. Felecia Shelling, MD, PhD 2/69/4854, 6:27 PM Certified in Neurology, Clinical Neurophysiology, Sleep Medicine, Pain Medicine and Neuroimaging   [facility] [address] [place], [place] [address] [phone] [phone]

## 2018-03-19 LAB — OLIGOCLONAL BANDS, CSF + SERM

## 2018-03-20 LAB — PAN-ANCA
ANCA Proteinase 3: 5.6 U/mL — ABNORMAL HIGH (ref 0.0–3.5)
C-ANCA: <1:20 {titer}
Myeloperoxidase Ab: <9 U/mL (ref 0.0–9.0)
P-ANCA: <1:20 {titer}

## 2018-03-20 LAB — ANA W/REFLEX: Anti Nuclear Antibody(ANA): NEGATIVE

## 2018-03-20 LAB — VITAMIN D 25 HYDROXY (VIT D DEFICIENCY, FRACTURES): Vit D, 25-Hydroxy: 18.4 ng/mL — ABNORMAL LOW (ref 30.0–100.0)

## 2018-03-21 ENCOUNTER — Telehealth: Payer: Self-pay | Admitting: *Deleted

## 2018-03-21 MED ORDER — VITAMIN D (ERGOCALCIFEROL) 1.25 MG (50000 UNIT) PO CAPS: 50000.0000 [IU] | ORAL_CAPSULE | ORAL | 1 refills | Status: DC

## 2018-03-21 NOTE — Telephone Encounter (Signed)
-----   Message from Britt Bottom, MD sent at 03/20/2018  6:44 PM EST ----- Vitamin D was fairly low and I would like her to take 50,000 units weekly for 6 months.     #13  #1 refill The other tests were good      (ANCA Pr3 with negative P-ANCA is considered false positive)

## 2018-03-21 NOTE — Telephone Encounter (Signed)
Spoke with [name] and reviewed below lab results, along with rec. to take Vit. D 50,000u once wkly for 6 mos. She verbalized understanding of same, is agreeable. Vit. D escribed to [facility] per her request/fim

## 2018-03-25 NOTE — Telephone Encounter (Signed)
Pt has called for the results, please call

## 2018-03-25 NOTE — Telephone Encounter (Signed)
Spoke with Angel Rodriguez. She continues to c/o intermittent right hand numbness, sts. there is a spot that is tender to the touch. Wants to know if Dr. Felecia Shelling has seen her MRI's, LP results done at the hospital yet. I explained We will call him just as soon as he has. For intermittent hand numbness, he is not likely to change tx. plan. It is difficult to say why she has an area that is tender to the touch; she should monitor this/fim

## 2018-03-26 NOTE — Telephone Encounter (Signed)
Please let her know that the rest of the lumbar puncture labs have returned and they look normal.   I had seen the MRIs before her visit with me when she came in last week.   Because I do not have an explanation for what because those lesions on the brain, I had wanted her to see a neuroimmunologist at either [facility] or [name] and placed a request.  Please see if she has an appointment scheduled.

## 2018-03-27 NOTE — Telephone Encounter (Signed)
Spoke with Ruchi and advised LP results are normal. He does not have an explanation for lesions on brain--she should f/u with [facility] as previously discussed; continue Keppra for now. She verbalized understanding of same.  Phone#'s for [facility] given/fim

## 2018-04-01 ENCOUNTER — Other Ambulatory Visit: Payer: Self-pay | Admitting: Neurology

## 2018-04-10 ENCOUNTER — Ambulatory Visit: Payer: PRIVATE HEALTH INSURANCE | Admitting: Family Medicine

## 2018-05-01 ENCOUNTER — Ambulatory Visit: Payer: PRIVATE HEALTH INSURANCE | Admitting: Neurology

## 2018-05-09 ENCOUNTER — Encounter: Payer: Self-pay | Admitting: Family Medicine

## 2018-05-09 ENCOUNTER — Ambulatory Visit (INDEPENDENT_AMBULATORY_CARE_PROVIDER_SITE_OTHER): Payer: No Typology Code available for payment source | Admitting: Family Medicine

## 2018-05-09 VITALS — BP 127/82 | HR 58 | Temp 98.3°F | Ht 60.0 in | Wt 185.0 lb

## 2018-05-09 DIAGNOSIS — Z7689 Persons encountering health services in other specified circumstances: Secondary | ICD-10-CM

## 2018-05-09 DIAGNOSIS — G4700 Insomnia, unspecified: Secondary | ICD-10-CM

## 2018-05-09 DIAGNOSIS — F39 Unspecified mood [affective] disorder: Secondary | ICD-10-CM

## 2018-05-09 DIAGNOSIS — F419 Anxiety disorder, unspecified: Secondary | ICD-10-CM

## 2018-05-09 DIAGNOSIS — F339 Major depressive disorder, recurrent, unspecified: Secondary | ICD-10-CM | POA: Insufficient documentation

## 2018-05-09 DIAGNOSIS — F321 Major depressive disorder, single episode, moderate: Secondary | ICD-10-CM | POA: Insufficient documentation

## 2018-05-09 DIAGNOSIS — R410 Disorientation, unspecified: Secondary | ICD-10-CM | POA: Diagnosis not present

## 2018-05-09 DIAGNOSIS — Z79899 Other long term (current) drug therapy: Secondary | ICD-10-CM

## 2018-05-09 NOTE — Patient Instructions (Addendum)
---> Told patient to please bring in a list of her current medications that she actually takes every day in the very near future so we can update her medication list.  She was told this several times during the office visit.     Please realize, EXERCISE IS MEDICINE!  -  American Heart Association [facility]) guidelines for exercise : If you are in good health, without any medical conditions, you should engage in 150-300 minutes of moderate intensity aerobic activity per week.  This means you should be huffing and puffing throughout your workout.   Engaging in regular exercise will improve brain function and memory, as well as improve mood, boost immune system and help with weight management.  As well as the other, more well-known effects of exercise such as decreasing blood sugar levels, decreasing blood pressure,  and decreasing bad cholesterol levels/ increasing good cholesterol levels.     -  The AHA strongly endorses consumption of a diet that contains a variety of foods from all the food categories with an emphasis on fruits and vegetables; fat-free and low-fat dairy products; cereal and grain products; legumes and nuts; and fish, poultry, and/or extra lean meats.    Excessive food intake, especially of foods high in saturated and trans fats, sugar, and salt, should be avoided.    Adequate water intake of roughly 1/2 of your weight in pounds, should equal the ounces of water per day you should drink.  So for instance, if you're 200 pounds, that would be 100 ounces of water per day.         Mediterranean Diet  Why follow it? Research shows. . Those who follow the Mediterranean diet have a reduced risk of heart disease  . The diet is associated with a reduced incidence of Parkinson's and Alzheimer's diseases . People following the diet may have longer life expectancies and lower rates of chronic diseases  . The Dietary Guidelines for Americans recommends the Mediterranean diet as an eating plan to  promote health and prevent disease  What Is the Mediterranean Diet?  . Healthy eating plan based on typical foods and recipes of Mediterranean-style cooking . The diet is primarily a plant based diet; these foods should make up a majority of meals   Starches - Plant based foods should make up a majority of meals - They are an important sources of vitamins, minerals, energy, antioxidants, and fiber - Choose whole grains, foods high in fiber and minimally processed items  - Typical grain sources include wheat, oats, barley, corn, brown rice, bulgar, farro, millet, polenta, couscous  - Various types of beans include chickpeas, lentils, fava beans, black beans, white beans   Fruits  Veggies - Large quantities of antioxidant rich fruits & veggies; 6 or more servings  - Vegetables can be eaten raw or lightly drizzled with oil and cooked  - Vegetables common to the traditional Mediterranean Diet include: artichokes, arugula, beets, broccoli, brussel sprouts, cabbage, carrots, celery, collard greens, cucumbers, eggplant, kale, leeks, lemons, lettuce, mushrooms, okra, onions, peas, peppers, potatoes, pumpkin, radishes, rutabaga, shallots, spinach, sweet potatoes, turnips, zucchini - Fruits common to the Mediterranean Diet include: apples, apricots, avocados, cherries, clementines, dates, figs, grapefruits, grapes, melons, nectarines, oranges, peaches, pears, pomegranates, strawberries, tangerines  Fats - Replace butter and margarine with healthy oils, such as olive oil, canola oil, and tahini  - Limit nuts to no more than a handful a day  - Nuts include walnuts, almonds, pecans, pistachios, pine nuts  -  Limit or avoid candied, honey roasted or heavily salted nuts - Olives are central to the Mediterranean diet - can be eaten whole or used in a variety of dishes   Meats Protein - Limiting red meat: no more than a few times a month - When eating red meat: choose lean cuts and keep the portion to the size of  deck of cards - Eggs: approx. 0 to 4 times a week  - Fish and lean poultry: at least 2 a week  - Healthy protein sources include, chicken, [place], lean beef, lamb - Increase intake of seafood such as tuna, salmon, trout, mackerel, shrimp, scallops - Avoid or limit high fat processed meats such as sausage and bacon  Dairy - Include moderate amounts of low fat dairy products  - Focus on healthy dairy such as fat free yogurt, skim milk, low or reduced fat cheese - Limit dairy products higher in fat such as whole or 2% milk, cheese, ice cream  Alcohol - Moderate amounts of red wine is ok  - No more than 5 oz daily for women (all ages) and men older than age 7  - No more than 10 oz of wine daily for men younger than 2  Other - Limit sweets and other desserts  - Use herbs and spices instead of salt to flavor foods  - Herbs and spices common to the traditional Mediterranean Diet include: basil, bay leaves, chives, cloves, cumin, fennel, garlic, lavender, marjoram, mint, oregano, parsley, pepper, rosemary, sage, savory, sumac, tarragon, thyme   It's not just a diet, it's a lifestyle:  . The Mediterranean diet includes lifestyle factors typical of those in the region  . Foods, drinks and meals are best eaten with others and savored . Daily physical activity is important for overall good health . This could be strenuous exercise like running and aerobics . This could also be more leisurely activities such as walking, housework, yard-work, or taking the stairs . Moderation is the key; a balanced and healthy diet accommodates most foods and drinks . Consider portion sizes and frequency of consumption of certain foods   Meal Ideas & Options:  . Breakfast:  o Whole wheat toast or whole wheat English muffins with peanut butter & hard boiled egg o Steel cut oats topped with apples & cinnamon and skim milk  o Fresh fruit: banana, strawberries, melon, berries, peaches  o Smoothies: strawberries,  bananas, greek yogurt, peanut butter o Low fat greek yogurt with blueberries and granola  o Egg white omelet with spinach and mushrooms o Breakfast couscous: whole wheat couscous, apricots, skim milk, cranberries  . Sandwiches:  o Hummus and grilled vegetables (peppers, zucchini, squash) on whole wheat bread   o Grilled chicken on whole wheat pita with lettuce, tomatoes, cucumbers or tzatziki  o Tuna salad on whole wheat bread: tuna salad made with greek yogurt, olives, red peppers, capers, green onions o Garlic rosemary lamb pita: lamb sauted with garlic, rosemary, salt & pepper; add lettuce, cucumber, greek yogurt to pita - flavor with lemon juice and black pepper  . Seafood:  o Mediterranean grilled salmon, seasoned with garlic, basil, parsley, lemon juice and black pepper o Shrimp, lemon, and spinach whole-grain pasta salad made with low fat greek yogurt  o Seared scallops with lemon orzo  o Seared tuna steaks seasoned salt, pepper, coriander topped with tomato mixture of olives, tomatoes, olive oil, minced garlic, parsley, green onions and cappers  . Meats:  o Herbed greek chicken salad  with kalamata olives, cucumber, feta  o Red bell peppers stuffed with spinach, bulgur, lean ground beef (or lentils) & topped with feta   o Kebabs: skewers of chicken, tomatoes, onions, zucchini, squash  o [place] burgers: made with red onions, mint, dill, lemon juice, feta cheese topped with roasted red peppers . Vegetarian o Cucumber salad: cucumbers, artichoke hearts, celery, red onion, feta cheese, tossed in olive oil & lemon juice  o Hummus and whole grain pita points with a greek salad (lettuce, tomato, feta, olives, cucumbers, red onion) o Lentil soup with celery, carrots made with vegetable broth, garlic, salt and pepper  o Tabouli salad: parsley, bulgur, mint, scallions, cucumbers, tomato, radishes, lemon juice, olive oil, salt and pepper.   Behavior Modification Ideas for Weight  Management  Weight management involves adopting a healthy lifestyle that includes a knowledge of nutrition and exercise, a positive attitude and the right kind of motivation. Internal motives such as better health, increased energy, self-esteem and personal control increase your chances of lifelong weight management success.  Remember to have realistic goals and think long-term success. Believe in yourself and you can do it. The following information will give you ideas to help you meet your goals.  Control Your Home Environment  Eat only while sitting down at the kitchen or dining room table. Do not eat while watching television, reading, cooking, talking on the phone, standing at the refrigerator or working on the computer. Keep tempting foods out of the house - don't buy them. Keep tempting foods out of sight. Have low-calorie foods ready to eat. Unless you are preparing a meal, stay out of the kitchen. Have healthy snacks at your disposal, such as small pieces of fruit, vegetables, canned fruit, pretzels, low-fat string cheese and nonfat cottage cheese.  Control Your Work Environment  Do not eat at [employer] or keep tempting snacks at your desk. If you get hungry between meals, plan healthy snacks and bring them with you to work. During your breaks, go for a walk instead of eating. If you work around food, plan in advance the one item you will eat at mealtime. Make it inconvenient to nibble on food by chewing gum, sugarless candy or drinking water or another low-calorie beverage. Do not work through meals. Skipping meals slows down metabolism and may result in overeating at the next meal. If food is available for special occasions, either pick the healthiest item, nibble on low-fat snacks brought from home, don't have anything offered, choose one option and have a small amount, or have only a beverage.  Control Your Mealtime Environment  Serve your plate of food at the stove or kitchen  counter. Do not put the serving dishes on the table. If you do put dishes on the table, remove them immediately when finished eating. Fill half of your plate with vegetables, a quarter with lean protein and a quarter with starch. Use smaller plates, bowls and glasses. A smaller portion will look large when it is in a little dish. Politely refuse second helpings. When fixing your plate, limit portions of food to one scoop/serving or less.   Daily Food Management  Replace eating with another activity that you will not associate with food. Wait 20 minutes before eating something you are craving. Drink a large glass of water or diet soda before eating. Always have a big glass or bottle of water to drink throughout the day. Avoid high-calorie add-ons such as cream with your coffee, butter, mayonnaise and salad dressings.  Shopping: Do not shop when hungry or tired. Shop from a list and avoid buying anything that is not on your list. If you must have tempting foods, buy individual-sized packages and try to find a lower-calorie alternative. Don't taste test in the store. Read food labels. Compare products to help you make the healthiest choices.  Preparation: Chew a piece of gum while cooking meals. Use a quarter teaspoon if you taste test your food. Try to only fix what you are going to eat, leaving yourself no chance for seconds. If you have prepared more food than you need, portion it into individual containers and freeze or refrigerate immediately. Don't snack while cooking meals.  Eating: Eat slowly. Remember it takes about 20 minutes for your stomach to send a message to your brain that it is full. Don't let fake hunger make you think you need more. The ideal way to eat is to take a bite, put your utensil down, take a sip of water, cut your next bite, take a bit, put your utensil down and so on. Do not cut your food all at one time. Cut only as needed. Take small bites and chew your  food well. Stop eating for a minute or two at least once during a meal or snack. Take breaks to reflect and have conversation.  Cleanup and Leftovers: Label leftovers for a specific meal or snack. Freeze or refrigerate individual portions of leftovers. Do not clean up if you are still hungry.  Eating Out and Social Eating  Do not arrive hungry. Eat something light before the meal. Try to fill up on low-calorie foods, such as vegetables and fruit, and eat smaller portions of the high-calorie foods. Eat foods that you like, but choose small portions. If you want seconds, wait at least 20 minutes after you have eaten to see if you are actually hungry or if your eyes are bigger than your stomach. Limit alcoholic beverages. Try a soda water with a twist of lime. Do not skip other meals in the day to save room for the special event.  At Restaurants: Order  la carte rather than buffet style. Order some vegetables or a salad for an appetizer instead of eating bread. If you order a high-calorie dish, share it with someone. Try an after-dinner mint with your coffee. If you do have dessert, share it with two or more people. Don't overeat because you do not want to waste food. Ask for a doggie bag to take extra food home. Tell the server to put half of your entree in a to go bag before the meal is served to you. Ask for salad dressing, gravy or high-fat sauces on the side. Dip the tip of your fork in the dressing before each bite. If bread is served, ask for only one piece. Try it plain without butter or oil. At [employer] where oil and vinegar is served with bread, use only a small amount of oil and a lot of vinegar for dipping.  At a Friend[facility] House: Offer to bring a dish, appetizer or dessert that is low in calories. Serve yourself small portions or tell the host that you only want a small amount. Stand or sit away from the snack table. Stay away from the kitchen or stay busy if you are  near the food. Limit your alcohol intake.  At [employer] and Cafeterias: Cover most of your plate with lettuce and/or vegetables. Use a salad plate instead of a dinner plate. After eating,  clear away your dishes before having coffee or tea.  Entertaining at Home: Explore low-fat, low-cholesterol cookbooks. Use single-serving foods like chicken breasts or hamburger patties. Prepare low-calorie appetizers and desserts.   Holidays: Keep tempting foods out of sight. Decorate the house without using food. Have low-calorie beverages and foods on hand for guests. Allow yourself one planned treat a day. Don't skip meals to save up for the holiday feast. Eat regular, planned meals.   Exercise Well  Make exercise a priority and a planned activity in the day. If possible, walk the entire or part of the distance to work. Get an exercise buddy. Go for a walk with a colleague during one of your breaks, go to the gym, run or take a walk with a friend, walk in the mall with a shopping companion. Park at the end of the parking lot and walk to the store or office entrance. Always take the stairs all of the way or at least part of the way to your floor. If you have a desk job, walk around the office frequently. Do leg lifts while sitting at your desk. Do something outside on the weekends like going for a hike or a bike ride.   Have a Healthy Attitude  Make health your weight management priority. Be realistic. Have a goal to achieve a healthier you, not necessarily the lowest weight or ideal weight based on calculations or tables. Focus on a healthy eating style, not on dieting. Dieting usually lasts for a short amount of time and rarely produces long-term success. Think long term. You are developing new healthy behaviors to follow next month, in a year and in a decade.    This information is for educational purposes only and is not intended to replace the advice of your doctor or health care  provider. We encourage you to discuss with your doctor any questions or concerns you may have.        Guidelines for Losing Weight   We want weight loss that will last so you should lose 1-2 pounds a week.  THAT IS IT! Please pick THREE things a month to change. Once it is a habit check off the item. Then pick another three items off the list to become habits.  If you are already doing a habit on the list GREAT!  Cross that item off!  Don't drink your calories. Ie, alcohol, soda, fruit juice, and sweet tea.   Drink more water. Drink a glass when you feel hungry or before each meal.   Eat breakfast - Complex carb and protein (likeDannon light and fit yogurt, oatmeal, fruit, eggs, [place] bacon).  Measure your cereal.  Eat no more than one cup a day. (ie Kashi)  Eat an apple a day.  Add a vegetable a day.  Try a new vegetable a month.  Use Pam! Stop using oil or butter to cook.  Don't finish your plate or use smaller plates.  Share your dessert.  Eat sugar free Jello for dessert or frozen grapes.  Don't eat 2-3 hours before bed.  Switch to whole wheat bread, pasta, and brown rice.  Make healthier choices when you eat out. No fries!  Pick baked chicken, NOT fried.  Don't forget to SLOW DOWN when you eat. It is not going anywhere.   Take the stairs.  Park far away in the parking lot  Lift soup cans (or weights) for 10 minutes while watching TV.  Walk at work for 10 minutes during  break.  Walk outside 1 time a week with your friend, kids, dog, or significant other.  Start a walking group at church.  Walk the mall as much as you can tolerate.   Keep a food diary.  Weigh yourself daily.  Walk for 15 minutes 3 days per week.  Cook at home more often and eat out less. If life happens and you go back to old habits, it is okay.  Just start over. You can do it!  If you experience chest pain, get short of breath, or tired during the exercise, please stop  immediately and inform your doctor.    Before you even begin to attack a weight-loss plan, it pays to remember this: You are not fat. You have fat. Losing weight isn't about blame or shame; it's simply another achievement to accomplish. Dieting is like any other skill-you have to buckle down and work at it. As long as you act in a smart, reasonable way, you'll ultimately get where you want to be. Here are some weight loss pearls for you.   1. It's Not a Diet. It's a Lifestyle Thinking of a diet as something you're on and suffering through only for the short term doesn't work. To shed weight and keep it off, you need to make permanent changes to the way you eat. It's OK to indulge occasionally, of course, but if you cut calories temporarily and then revert to your old way of eating, you'll gain back the weight quicker than you can say yo-yo. Use it to lose it. Research shows that one of the best predictors of long-term weight loss is how many pounds you drop in the first month. For that reason, nutritionists often suggest being stricter for the first two weeks of your new eating strategy to build momentum. Cut out added sugar and alcohol and avoid unrefined carbs. After that, figure out how you can reincorporate them in a way that's healthy and maintainable.  2. There's a Right Way to Exercise Working out burns calories and fat and boosts your metabolism by building muscle. But those trying to lose weight are notorious for overestimating the number of calories they burn and underestimating the amount they take in. Unfortunately, your system is biologically programmed to hold on to extra pounds and that means when you start exercising, your body senses the deficit and ramps up its hunger signals. If you're not diligent, you'll eat everything you burn and then some. Use it, to lose it. Cardio gets all the exercise glory, but strength and interval training are the real heroes. They help you build lean muscle,  which in turn increases your metabolism and calorie-burning ability 3. Don't Overreact to Mild Hunger Some people have a hard time losing weight because of hunger anxiety. To them, being hungry is bad-something to be avoided at all costs-so they carry snacks with them and eat when they don't need to. Others eat because they're stressed out or bored. While you never want to get to the point of being ravenous (that's when bingeing is likely to happen), a hunger pang, a craving, or the fact that it's 3:00 p.m. should not send you racing for the vending machine or obsessing about the energy bar in your purse. Ideally, you should put off eating until your stomach is growling and it's difficult to concentrate.  Use it to lose it. When you feel the urge to eat, use the HALT method. Ask yourself, Am I really hungry? Or am I angry  or anxious, lonely or bored, or tired? If you're still not certain, try the apple test. If you're truly hungry, an apple should seem delicious; if it doesn't, something else is going on. Or you can try drinking water and making yourself busy, if you are still hungry try a healthy snack.  4. Not All Calories Are Created Equal The mechanics of weight loss are pretty simple: Take in fewer calories than you use for energy. But the kind of food you eat makes all the difference. Processed food that's high in saturated fat and refined starch or sugar can cause inflammation that disrupts the hormone signals that tell your brain you're full. The result: You eat a lot more.  Use it to lose it. Clean up your diet. Swap in whole, unprocessed foods, including vegetables, lean protein, and healthy fats that will fill you up and give you the biggest nutritional bang for your calorie buck. In a few weeks, as your brain starts receiving regular hunger and fullness signals once again, you'll notice that you feel less hungry overall and naturally start cutting back on the amount you eat.  5. Protein, Produce,  and Plant-Based Fats Are Your Weight-Loss Trinity Here's why eating the three Ps regularly will help you drop pounds. Protein fills you up. You need it to build lean muscle, which keeps your metabolism humming so that you can torch more fat. People in a weight-loss program who ate double the recommended daily allowance for protein (about 110 grams for a 150-pound woman) lost 70 percent of their weight from fat, while people who ate the RDA lost only about 40 percent, one study found. Produce is packed with filling fiber. "It's very difficult to consume too many calories if you're eating a lot of vegetables. Example: Three cups of broccoli is a lot of food, yet only 93 calories. (Fruit is another story. It can be easy to overeat and can contain a lot of calories from sugar, so be sure to monitor your intake.) Plant-based fats like olive oil and those in avocados and nuts are healthy and extra satiating.  Use it to lose it. Aim to incorporate each of the three Ps into every meal and snack. People who eat protein throughout the day are able to keep weight off, according to a study in the [place] of Clinical Nutrition. In addition to meat, poultry and seafood, good sources are beans, lentils, eggs, tofu, and yogurt. As for fat, keep portion sizes in check by measuring out salad dressing, oil, and nut butters (shoot for one to two tablespoons). Finally, eat veggies or a little fruit at every meal. People who did that consumed 308 fewer calories but didn't feel any hungrier than when they didn't eat more produce.  7. How You Eat Is As Important As What You Eat In order for your brain to register that you're full, you need to focus on what you're eating. Sit down whenever you eat, preferably at a table. Turn off the TV or computer, put down your phone, and look at your food. Smell it. Chew slowly, and don't put another bite on your fork until you swallow. When women ate lunch this attentively, they  consumed 30 percent less when snacking later than those who listened to an audiobook at lunchtime, according to a study in the [place] of Nutrition. 8. Weighing Yourself Really Works The scale provides the best evidence about whether your efforts are paying off. Seeing the numbers tick up or down  or stagnate is motivation to keep going-or to rethink your approach. A 2015 study at [facility] found that daily weigh-ins helped people lose more weight, keep it off, and maintain that loss, even after two years. Use it to lose it. Step on the scale at the same time every day for the best results. If your weight shoots up several pounds from one weigh-in to the next, don't freak out. Eating a lot of salt the night before or having your period is the likely culprit. The number should return to normal in a day or two. It's a steady climb that you need to do something about. 9. Too Much Stress and Too Little Sleep Are Your Enemies When you're tired and frazzled, your body cranks up the production of cortisol, the stress hormone that can cause carb cravings. Not getting enough sleep also boosts your levels of ghrelin, a hormone associated with hunger, while suppressing leptin, a hormone that signals fullness and satiety. People on a diet who slept only five and a half hours a night for two weeks lost 55 percent less fat and were hungrier than those who slept eight and a half hours, according to a study in the [place]. Use it to lose it. Prioritize sleep, aiming for seven hours or more a night, which research shows helps lower stress. And make sure you're getting quality zzz's. If a snoring spouse or a fidgety cat wakes you up frequently throughout the night, you may end up getting the equivalent of just four hours of sleep, according to a study from [facility]. Keep pets out of the bedroom, and use a white-noise app to drown out snoring. 10. You Will Hit a  plateau-And You Can Bust Through It As you slim down, your body releases much less leptin, the fullness hormone.  If you're not strength training, start right now. Building muscle can raise your metabolism to help you overcome a plateau. To keep your body challenged and burning calories, incorporate new moves and more intense intervals into your workouts or add another sweat session to your weekly routine. Alternatively, cut an extra 100 calories or so a day from your diet. Now that you've lost weight, your body simply doesn't need as much fuel.    Since food equals calories, in order to lose weight you must either eat fewer calories, exercise more to burn off calories with activity, or both. Food that is not used to fuel the body is stored as fat. A major component of losing weight is to make smarter food choices. Here's how:  1)   Limit non-nutritious foods, such as: Sugar, honey, syrups and candy Pastries, donuts, pies, cakes and cookies Soft drinks, sweetened juices and alcoholic beverages  2)  Cut down on high-fat foods by: - Choosing poultry, fish or lean red meat - Choosing low-fat cooking methods, such as baking, broiling, steaming, grilling and boiling - Using low-fat or non-fat dairy products - Using vinaigrette, herbs, lemon or fat-free salad dressings - Avoiding fatty meats, such as bacon, sausage, franks, ribs and luncheon meats - Avoiding high-fat snacks like nuts, chips and chocolate - Avoiding fried foods - Using less butter, margarine, oil and mayonnaise - Avoiding high-fat gravies, cream sauces and cream-based soups  3) Eat a variety of foods, including: - Fruit and vegetables that are raw, steamed or baked - Whole grains, breads, cereal, rice and pasta - Dairy products, such as low-fat or non-fat milk or yogurt, low-fat cottage cheese  and low-fat cheese - Protein-rich foods like chicken, [place], fish, lean meat and legumes, or beans  4) Change your eating habits  by: - Eat three balanced meals a day to help control your hunger - Watch portion sizes and eat small servings of a variety of foods - Choose low-calorie snacks - Eat only when you are hungry and stop when you are satisfied - Eat slowly and try not to perform other tasks while eating - Find other activities to distract you from food, such as walking, taking up a hobby or being involved in the community - Include regular exercise in your daily routine ( minimum of 20 min of moderate-intensity exercise at least 5 days/week)  - Find a support group, if necessary, for emotional support in your weight loss journey           Easy ways to cut 100 calories   1. Eat your eggs with hot sauce OR salsa instead of cheese.  Eggs are great for breakfast, but many people consider eggs and cheese to be BFFs. Instead of cheese-1 oz. of cheddar has 114 calories-top your eggs with hot sauce, which contains no calories and helps with satiety and metabolism. Salsa is also a great option!!  2. Top your toast, waffles or pancakes with fresh berries instead of jelly or syrup. Half a cup of berries-fresh, frozen or thawed-has about 40 calories, compared with 2 tbsp. of maple syrup or jelly, which both have about 100 calories. The berries will also give you a good punch of fiber, which helps keep you full and satisfied and won't spike blood sugar quickly like the jelly or syrup. 3. Swap the non-fat latte for black coffee with a splash of half-and-half. Contrary to its name, that non-fat latte has 130 calories and a startling 19g of carbohydrates per 16 oz. serving. Replacing that 'light' drinkable dessert with a black coffee with a splash of half-and-half saves you more than 100 calories per 16 oz. serving. 4. Sprinkle salads with freeze-dried raspberries instead of dried cranberries. If you want a sweet addition to your nutritious salad, stay away from dried cranberries. They have a whopping 130 calories per  cup  and 30g carbohydrates. Instead, sprinkle freeze-dried raspberries guilt-free and save more than 100 calories per  cup serving, adding 3g of belly-filling fiber. 5. Go for mustard in place of mayo on your sandwich. Mustard can add really nice flavor to any sandwich, and there are tons of varieties, from spicy to honey. A serving of mayo is 95 calories, versus 10 calories in a serving of mustard.  Or try an avocado mayo spread: You can find the recipe few click this link: https://www.californiaavocado.com/recipes/recipe-container/[place]-avocado-mayo 6. Choose a DIY salad dressing instead of the store-bought kind. Mix Dijon or whole grain mustard with low-fat Kefir or red wine vinegar and garlic. 7. Use hummus as a spread instead of a dip. Use hummus as a spread on a high-fiber cracker or tortilla with a sandwich and save on calories without sacrificing taste. 8. Pick just one salad "accessory." Salad isn't automatically a calorie winner. It's easy to over-accessorize with toppings. Instead of topping your salad with nuts, avocado and cranberries (all three will clock in at 313 calories), just pick one. The next day, choose a different accessory, which will also keep your salad interesting. You don't wear all your jewelry every day, right? 9. Ditch the white pasta in favor of spaghetti squash. One cup of cooked spaghetti squash has about 40 calories, compared  with traditional spaghetti, which comes with more than 200. Spaghetti squash is also nutrient-dense. It's a good source of fiber and Vitamins A and C, and it can be eaten just like you would eat pasta-with a great tomato sauce and [place] meatballs or with pesto, tofu and spinach, for example. 10. Dress up your chili, soups and stews with non-fat [place] yogurt instead of sour cream. Just a 'dollop' of sour cream can set you back 115 calories and a whopping 12g of fat-seven of which are of the artery-clogging variety. Added bonus: [place] yogurt is  packed with muscle-building protein, calcium and B Vitamins. 11. Mash cauliflower instead of mashed potatoes. One cup of traditional mashed potatoes-in all their creamy goodness-has more than 200 calories, compared to mashed cauliflower, which you can typically eat for less than 100 calories per 1 cup serving. Cauliflower is a great source of the antioxidant indole-3-carbinol (I3C), which may help reduce the risk of some cancers, like breast cancer. 12. Ditch the ice cream sundae in favor of a [place] yogurt parfait. Instead of a cup of ice cream or fro-yo for dessert, try 1 cup of nonfat Greek yogurt topped with fresh berries and a sprinkle of cacao nibs. Both toppings are packed with antioxidants, which can help reduce cellular inflammation and oxidative damage. And the comparison is a no-brainer: One cup of ice cream has about 275 calories; one cup of frozen yogurt has about 230; and a cup of Greek yogurt has just 130, plus twice the protein, so you're less likely to return to the freezer for a second helping. 13. Put olive oil in a spray container instead of using it directly from the bottle. Each tablespoon of olive oil is 120 calories and 15g of fat. Use a mister instead of pouring it straight into the pan or onto a salad. This allows for portion control and will save you more than 100 calories. 14. When baking, substitute canned pumpkin for butter or oil. Canned pumpkin-not pumpkin pie mix-is loaded with Vitamin A, which is important for skin and eye health, as well as immunity. And the comparisons are pretty crazy:  cup of canned pumpkin has about 40 calories, compared to butter or oil, which has more than 800 calories. Yes, 800 calories. Applesauce and mashed banana can also serve as good substitutions for butter or oil, usually in a 1:1 ratio. 15. Top casseroles with high-fiber cereal instead of breadcrumbs. Breadcrumbs are typically made with white bread, while breakfast cereals contain 5-9g of  fiber per serving. Not only will you save more than 150 calories per  cup serving, the swap will also keep you more full and you'll get a metabolism boost from the added fiber. 16. Snack on pistachios instead of macadamia nuts. Believe it or not, you get the same amount of calories from 35 pistachios (100 calories) as you would from only five macadamia nuts. 17. Chow down on kale chips rather than potato chips. This is my favorite 'don't knock it 'till you try it' swap. Kale chips are so easy to make at home, and you can spice them up with a little grated parmesan or chili powder. Plus, they're a mere fraction of the calories of potato chips, but with the same crunch factor we crave so often. 18. Add seltzer and some fruit slices to your cocktail instead of soda or fruit juice. One cup of soda or fruit juice can pack on as much as 140 calories. Instead, use seltzer and fruit slices. The  fruit provides valuable phytochemicals, such as flavonoids and anthocyanins, which help to combat cancer and stave off the aging process.

## 2018-05-09 NOTE — Progress Notes (Signed)
New patient office visit note:  Impression and Recommendations:    1. Encounter to establish care with new doctor   2. Mental confusion/ slurred speech   3. Anxiety   4. Mood disorder ([name])   5. Episode of recurrent major depressive disorder, unspecified depression episode severity ([name])   6. Insomnia, unspecified type   7. High risk medications (not anticoagulants) long-term use      -  Patient seemed confused throughout duration of visit.   Her mentation was very slow.   She had slurred speech and did not know what medicines she was currently on, or what medicines she was supposed to be taking.   Patient had difficulty differentiating between medicines she was taking, and could not really state if she was taking medicines.   Could not remember who gave her medications, and could not remember the names of doctors or other specialists she has seen.   Patient was a very poor historian but I have concerns re: pt's medications- several CNS depressants she takes which is likely cause  - Told patient to discontinue high dose klonopin immediately.  Explained to pt re: withdrawal sx, thus must d/c slowly.  Goal should be to D/C all together.     Told patient to eventually discontinue tylenol #3 if possible as well.  - Explained that these are central nervous system depressants, and taking them is likely making her more depressed with slurred speech and confusion.   1. Encounter to Establish Care with New Doctor - Extensive discussion held with patient regarding establishing as a new patient.  Discussed policies and practices here at the clinic, and answered all questions about care team and health management during appointment.  - Discussed need for patient to continue to obtain management and screenings with all established specialists.  Educated patient at length about the critical importance of keeping health maintenance up to date.  - Participated in lengthy conversation and all  questions were answered.  2. Anxiety - Discussed importance of prudent chronic anxiety management.  - Educated patient regarding avoiding the use of clonazepam for chronic management. - Risks of chronic use of clonazepam extensively discussed in office today.  - Per patient, wishes to change how her anxiety is managed.  - Reviewed the "spokes of the wheel" of mood and health management.  Stressed the importance of ongoing prudent habits, including regular exercise, appropriate sleep hygiene, healthful dietary habits, and prayer/meditation to relax.   - Strongly advised patient to establish with a counselor. - Referral to psychology provided today.  - Referral to psychiatry discussed and recommended today. - Ambulatory referral to psychiatry placed today.  3. Need for Accurate Med List - Advised patient to double check her listed medications with what she takes at home.   - Patient knows to go home and carefully update her current med list.  - Patient will update the office with her amended med list.    Education and routine counseling performed. Handouts provided.   Orders Placed This Encounter  Procedures  . Ambulatory referral to Psychiatry  . Ambulatory referral to Psychology    Gross side effects, risk and benefits, and alternatives of medications discussed with patient.  Patient is aware that all medications have potential side effects and we are unable to predict every side effect or drug-drug interaction that may occur.  Expresses verbal understanding and consents to current therapy plan and treatment regimen.  Return for 58mo- come in for CPE- we  will obtain fasting bldwrk.  Please see AVS handed out to patient at the end of our visit for further patient instructions/ counseling done pertaining to today's office visit.    Note:  This document was prepared using Dragon voice recognition software and may include unintentional dictation errors.   This document serves  as a record of services personally performed by Mellody Dance, DO. It was created on her behalf by Toni Amend, a trained medical scribe. The creation of this record is based on the scribe's personal observations and the provider's statements to them.   I have reviewed the above medical documentation for accuracy and completeness and I concur.  Mellody Dance, DO 05/12/2018 8:55 PM    ---------------------------------------------------------------------------------------------------------------------------  Subjective:    Chief complaint:   Chief Complaint  Patient presents with  . Establish Care    HPI: Angel Rodriguez is a pleasant 43 y.o. female who presents to [place] [facility] today to review their medical history with me and establish care.   I asked the patient to review their chronic problem list with me to ensure everything was updated and accurate.    All recent office visits with other providers, any medical records that patient brought in etc  - I reviewed today.     We asked pt to get [place] their medical records from [facility] providers/ specialists that they had seen within the past 3-5 years- if they are in private practice and/or do not work for [employer], [facility], [place], [place] or [employer] owned practice.  Told them to call their specialists to clarify this if they are not sure.   Patient has never really had a primary care provider prior.  Notes she experiences significant stress at her job.  Tobacco Use Notes she smokes about one or two cigarettes per week. Says a pack might have lasted her a month in the past.  Past Medical History Notes she's followed up by [facility] and [facility], with some follow-up at [facility]. Notes she had a low blood count and no one could figure out why.  States 70% is [facility]es, and she sees Dr. Felecia Shelling of Neurology.  Patient doesn't know if she has high blood pressure or diabetes.  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  Patient does not follow up with psychiatry.  - Female Health In December or January, she was uncontrollably bleeding from her vagina.   States they figured out that this was due to fibroids. Patient eventually had a total hysterectomy and salpingectomy.  Patient states she had 3-4 surgeries back to back.  Was diagnosed with borderline epithelial neoplasm of the ovary, stating "they caught the ovarian cancer early enough that it was a miracle."  - Mood & Neurology; Headaches She is managed on Buspar and Prozac. Receives these drugs from Dr. Felecia Shelling of Neurology. Notes she does not take the Buspar and Prozac.  Patient follows up with Dr. Felecia Shelling for her headache and anxiety.  States she was scared to take the Prozac and Buspar because it made her heart palpitate and she stayed up all night.  Says that she medicates with Imitrex and tylenol for her headaches.  States she had a seizure at age 8, and had a similar episode on January first at work. Per patient, she also takes medicine for seizures.   Wt Readings from Last 3 Encounters:  05/09/18 185 lb (83.9 kg)  03/18/18 176 lb (79.8 kg)  03/14/18 172 lb 2.9 oz (  78.1 kg)   BP Readings from Last 3 Encounters:  05/09/18 127/82  03/18/18 112/75  03/15/18 (!) 117/54   Pulse Readings from Last 3 Encounters:  05/09/18 (!) 58  03/18/18 80  03/15/18 (!) 59   BMI Readings from Last 3 Encounters:  05/09/18 36.13 kg/m  03/18/18 34.37 kg/m  03/14/18 33.63 kg/m    Patient Care Team    Relationship Specialty Notifications Start End  Mellody Dance, DO PCP - General Family Medicine  05/09/18   Earlie Raveling, NP Nurse Practitioner Nurse Practitioner  05/09/18     Patient Active Problem List   Diagnosis Date Noted  . Mood disorder ([place]) 05/09/2018  . Episode of recurrent major depressive disorder ([place]) 05/09/2018  . Mental confusion/ slurred speech 05/09/2018  . High risk medications (not anticoagulants) long-term use  05/09/2018  . Numbness 03/18/2018  . Vitamin D deficiency 03/18/2018  . Thrombocytopenia ([place]) 03/14/2018  . Dysarthria 03/14/2018  . Brain lesion 03/14/2018  . Right sided weakness 03/13/2018  . 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 Abnormal MRI of head 01/21/2016  . Vascular headache 09/15/2015  . Insomnia 09/15/2015  . Headache 07/08/2015  . Headache, chronic migraine without aura, intractable 07/08/2015  . Anxiety 07/08/2015  . Neck pain 06/24/2015  . Seizure ([employer]) 06/24/2015  . Central retinal vein occlusion 12/11/2011  . Cotton wool exudates 12/11/2011  . Cystoid macular edema 12/11/2011       As reported by pt:  Past Medical History:  Diagnosis Date  . Anxiety   . 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 Cluster headaches    started after seizure and migraines  . Complication of anesthesia    trouble with short term memory after surgeries  . Depression   . Fibroids    s/p TLH, bilateral salpingectomy  . GERD (gastroesophageal reflux disease)    patient thinks due to medications  . History of anemia    prior to hysterectomy  . History of pneumonia   . History of trichomoniasis   . Seizures ([place]) x 1 2-3 yrs ago none since   once saw neurologist, full body brain MRI and another 6 months lster  . Status post right oophorectomy   . Thrombocytopenia ([place])    sees [place] with Heme Onc     Past Surgical History:  Procedure Laterality Date  . CYSTOSCOPY N/A 07/31/2017   Procedure: CYSTOSCOPY;  Surgeon: Megan Salon, MD;  Location: [facility];  Service: Gynecology;  Laterality: N/A;  . ENDOMETRIAL ABLATION    . EYE SURGERY     laser - removed blood from right eye  . HYSTEROSCOPY W/D&C    . LAPAROSCOPY N/A 09/19/2017   Procedure: LAPAROSCOPY DIAGNOSTIC WITH RIGHT OOPHERECTOMY, LYSIS OF ADHESIONS;  Surgeon: Salvadore Dom, MD;  Location: [facility];  Service: Gynecology;  Laterality: N/A;  . OMENTECTOMY N/A 11/21/2017   Procedure: OMENTECTOMY  AND PELVIC WASHINGS;  Surgeon: Isabel Caprice, MD;  Location: [facility];  Service: Gynecology;  Laterality: N/A;  . ROBOTIC ASSISTED SALPINGO OOPHERECTOMY Left 11/21/2017   Procedure: XI ROBOTIC ASSISTED LEFT SALPINGO OOPHORECTOMY;  Surgeon: Isabel Caprice, MD;  Location: [facility];  Service: Gynecology;  Laterality: Left;  . TOTAL LAPAROSCOPIC HYSTERECTOMY WITH SALPINGECTOMY Bilateral 07/31/2017   Procedure: TOTAL LAPAROSCOPIC HYSTERECTOMY WITH SALPINGECTOMY;  Surgeon: Megan Salon, MD;  Location: [facility];  Service: Gynecology;  Laterality: Bilateral;  20 week size uterus/ Alexis bag in room/ need 4.5 hours  .  TUBAL LIGATION    . WISDOM TOOTH EXTRACTION       Family History  Problem Relation Age of Onset  . Alcoholism Father   . Cirrhosis Father   . Cancer Maternal Grandmother        unsure of type; died of other causes  . Heart disease Maternal Aunt      Social History   Substance and Sexual Activity  Drug Use No     Social History   Substance and Sexual Activity  Alcohol Use Yes   Comment: occ wine     Social History   Tobacco Use  Smoking Status Current Some Day Smoker  . Years: 20.00  . Types: E-cigarettes, Cigarettes  Smokeless Tobacco Never Used  Tobacco Comment   occasional cigerate and vap     No outpatient medications have been marked as taking for the 05/09/18 encounter (Office Visit) with Mellody Dance, DO.    Allergies: Ivp dye [iodinated diagnostic agents] and Bee venom   Review of Systems  Constitutional: Negative for chills, diaphoresis, fever, malaise/fatigue and weight loss.       Notes weight gain.  HENT: Negative for congestion, sore throat and tinnitus.   Eyes: Negative for blurred vision, double vision and photophobia.  Respiratory: Negative for cough and wheezing.   Cardiovascular: Negative for chest pain and palpitations.  Gastrointestinal: Negative for blood in stool, diarrhea, nausea and vomiting.  Genitourinary: Negative for  dysuria, frequency and urgency.  Musculoskeletal: Negative for joint pain and myalgias.  Skin: Negative for itching and rash.  Neurological: Positive for headaches (chronic). Negative for dizziness, focal weakness and weakness.  Endo/Heme/Allergies: Positive for environmental allergies (chronic). Negative for polydipsia. Bruises/bleeds easily (chronic).  Psychiatric/Behavioral: Positive for memory loss (chronic). Negative for depression. The patient is nervous/anxious (chronic) and has insomnia (chronic).      Objective:   Blood pressure 127/82, pulse (!) 58, temperature 98.3 F (36.8 C), height 5' (1.524 m), weight 185 lb (83.9 kg), last menstrual period 05/02/2017, SpO2 100 %. Body mass index is 36.13 kg/m. General: Slurred speech.  Slow mentation.  Patient becomes tearful in office today discussing her health concerns.  Well Developed, well nourished, and in no acute distress.  Neuro: Alert and oriented x3, extra-ocular muscles intact, sensation grossly intact.  HEENT:[place]/AT, PERRLA, neck supple, No carotid bruits Skin: no gross rashes  Cardiac: Regular rate and rhythm Respiratory: Essentially clear to auscultation bilaterally. Not using accessory muscles, speaking in full sentences.  Abdominal: not grossly distended Musculoskeletal: Ambulates w/o diff, FROM * 4 ext.  Vasc: less 2 sec cap RF, warm and pink  Psych:  No HI/SI, judgement and insight good, Euthymic mood. Full Affect.    Recent Results (from the past 2160 hour(s))  Comprehensive metabolic panel     Status: Abnormal   Collection Time: 03/13/18  4:34 PM  Result Value Ref Range   Sodium 138 135 - 145 mmol/L   Potassium 3.5 3.5 - 5.1 mmol/L    Comment: SLIGHT HEMOLYSIS   Chloride 106 98 - 111 mmol/L   CO2 21 (L) 22 - 32 mmol/L   Glucose, Bld 99 70 - 99 mg/dL   BUN 9 6 - 20 mg/dL   Creatinine, Ser 0.84 0.44 - 1.00 mg/dL   Calcium 10.4 (H) 8.9 - 10.3 mg/dL   Total Protein 7.6 6.5 - 8.1 g/dL   Albumin 4.2 3.5 - 5.0  g/dL   AST 23 15 - 41 U/L   ALT 24 0 - 44 U/L  Alkaline Phosphatase 55 38 - 126 U/L   Total Bilirubin 0.7 0.3 - 1.2 mg/dL   GFR calc non Af Amer >60 >60 mL/min   GFR calc Af Amer >60 >60 mL/min   Anion gap 11 5 - 15    Comment: Performed at [facility] [address], [place], [place] [address]  CBG monitoring, ED     Status: Abnormal   Collection Time: 03/13/18  4:58 PM  Result Value Ref Range   Glucose-Capillary 103 (H) 70 - 99 mg/dL   Comment 1 Notify RN    Comment 2 Document in Chart   I-stat troponin, ED     Status: None   Collection Time: 03/13/18  5:26 PM  Result Value Ref Range   Troponin i, poc 0.00 0.00 - 0.08 ng/mL   Comment 3            Comment: Due to the release kinetics of cTnI, a negative result within the first hours of the onset of symptoms does not rule out myocardial infarction with certainty. If myocardial infarction is still suspected, repeat the test at appropriate intervals.   I-Stat beta hCG blood, ED     Status: None   Collection Time: 03/13/18  5:26 PM  Result Value Ref Range   I-stat hCG, quantitative <5.0 <5 mIU/mL   Comment 3            Comment:   GEST. AGE      CONC.  (mIU/mL)   <=1 WEEK        5 - 50     2 WEEKS       50 - 500     3 WEEKS       100 - 10,000     4 WEEKS     1,000 - 30,000        FEMALE AND NON-PREGNANT FEMALE:     LESS THAN 5 mIU/mL   I-Stat Chem 8, ED     Status: Abnormal   Collection Time: 03/13/18  5:28 PM  Result Value Ref Range   Sodium 140 135 - 145 mmol/L   Potassium 3.4 (L) 3.5 - 5.1 mmol/L   Chloride 106 98 - 111 mmol/L   BUN 9 6 - 20 mg/dL   Creatinine, Ser 0.80 0.44 - 1.00 mg/dL   Glucose, Bld 103 (H) 70 - 99 mg/dL   Calcium, Ion 1.19 1.15 - 1.40 mmol/L   TCO2 24 22 - 32 mmol/L   Hemoglobin 13.6 12.0 - 15.0 g/dL   HCT 40.0 36.0 - 46.0 %  Protime-INR     Status: None   Collection Time: 03/13/18  6:11 PM  Result Value Ref Range   Prothrombin Time 12.6 11.4 - 15.2 seconds   INR 0.95     Comment:  Performed at [facility], [place] [address] [place], [place] [address]  APTT     Status: None   Collection Time: 03/13/18  6:11 PM  Result Value Ref Range   aPTT 28 24 - 36 seconds    Comment: Performed at [facility] [address] [place] [address]  CBC     Status: Abnormal   Collection Time: 03/13/18  6:11 PM  Result Value Ref Range   WBC 6.6 4.0 - 10.5 K/uL   RBC 4.61 3.87 - 5.11 MIL/uL   Hemoglobin 13.6 12.0 - 15.0 g/dL   HCT 41.1 36.0 - 46.0 %   MCV 89.2 80.0 -  100.0 fL   MCH 29.5 26.0 - 34.0 pg   MCHC 33.1 30.0 - 36.0 g/dL   RDW 12.3 11.5 - 15.5 %   Platelets 59 (L) 150 - 400 K/uL    Comment: REPEATED TO VERIFY PLATELET COUNT CONFIRMED BY SMEAR Immature Platelet Fraction may be clinically indicated, consider ordering this additional test [id number]    nRBC 0.0 0.0 - 0.2 %    Comment: Performed at [facility] [address] [place], [place] [address]  Rapid urine drug screen (hospital performed)     Status: None   Collection Time: 03/13/18  6:57 PM  Result Value Ref Range   Opiates NONE DETECTED NONE DETECTED   Cocaine NONE DETECTED NONE DETECTED   Benzodiazepines NONE DETECTED NONE DETECTED   Amphetamines NONE DETECTED NONE DETECTED   Tetrahydrocannabinol NONE DETECTED NONE DETECTED   Barbiturates NONE DETECTED NONE DETECTED    Comment: (NOTE) DRUG SCREEN FOR MEDICAL PURPOSES ONLY.  IF CONFIRMATION IS NEEDED FOR ANY PURPOSE, NOTIFY LAB WITHIN 5 DAYS. LOWEST DETECTABLE LIMITS FOR URINE DRUG SCREEN Drug Class                     Cutoff (ng/mL) Amphetamine and metabolites    1000 Barbiturate and metabolites    200 Benzodiazepine                 354 Tricyclics and metabolites     300 Opiates and metabolites        300 Cocaine and metabolites        300 THC                            50 Performed at [facility], [place] [address] [place], [place] [address]   Urinalysis, Routine w reflex microscopic     Status: Abnormal    Collection Time: 03/13/18  6:57 PM  Result Value Ref Range   Color, Urine STRAW (A) YELLOW   APPearance CLEAR CLEAR   Specific Gravity, Urine 1.003 (L) 1.005 - 1.030   pH 8.0 5.0 - 8.0   Glucose, UA NEGATIVE NEGATIVE mg/dL   Hgb urine dipstick NEGATIVE NEGATIVE   Bilirubin Urine NEGATIVE NEGATIVE   Ketones, ur NEGATIVE NEGATIVE mg/dL   Protein, ur NEGATIVE NEGATIVE mg/dL   Nitrite NEGATIVE NEGATIVE   Leukocytes, UA NEGATIVE NEGATIVE    Comment: Performed at [facility] [address], [place], [place] [address]  CBC     Status: Abnormal   Collection Time: 03/14/18  3:16 AM  Result Value Ref Range   WBC 8.4 4.0 - 10.5 K/uL   RBC 4.49 3.87 - 5.11 MIL/uL   Hemoglobin 13.3 12.0 - 15.0 g/dL   HCT 40.0 36.0 - 46.0 %   MCV 89.1 80.0 - 100.0 fL   MCH 29.6 26.0 - 34.0 pg   MCHC 33.3 30.0 - 36.0 g/dL   RDW 12.2 11.5 - 15.5 %   Platelets 67 (L) 150 - 400 K/uL    Comment: REPEATED TO VERIFY Immature Platelet Fraction may be clinically indicated, consider ordering this additional test [id number] CONSISTENT WITH PREVIOUS RESULT    nRBC 0.0 0.0 - 0.2 %    Comment: Performed at [facility], [place] [address] [place], [place] [address]  Comprehensive metabolic panel     Status: Abnormal   Collection Time: 03/14/18  3:16 AM  Result Value Ref Range  Sodium 136 135 - 145 mmol/L   Potassium 3.9 3.5 - 5.1 mmol/L   Chloride 107 98 - 111 mmol/L   CO2 17 (L) 22 - 32 mmol/L   Glucose, Bld 194 (H) 70 - 99 mg/dL   BUN 7 6 - 20 mg/dL   Creatinine, Ser 0.86 0.44 - 1.00 mg/dL   Calcium 10.0 8.9 - 10.3 mg/dL   Total Protein 7.5 6.5 - 8.1 g/dL   Albumin 4.0 3.5 - 5.0 g/dL   AST 28 15 - 41 U/L   ALT 26 0 - 44 U/L   Alkaline Phosphatase 53 38 - 126 U/L   Total Bilirubin 0.5 0.3 - 1.2 mg/dL   GFR calc non Af Amer >60 >60 mL/min   GFR calc Af Amer >60 >60 mL/min   Anion gap 12 5 - 15    Comment: Performed at [facility], [place] [address], [place], [place] [address]  Hemoglobin A1c      Status: None   Collection Time: 03/14/18  3:16 AM  Result Value Ref Range   Hgb A1c MFr Bld 5.4 4.8 - 5.6 %    Comment: (NOTE) Pre diabetes:          5.7%-6.4% Diabetes:              >6.4% Glycemic control for   <7.0% adults with diabetes    Mean Plasma Glucose 108.28 mg/dL    Comment: Performed at [facility] [address] [place], [place] [address]  Lipid panel     Status: Abnormal   Collection Time: 03/14/18  3:16 AM  Result Value Ref Range   Cholesterol 319 (H) 0 - 200 mg/dL   Triglycerides 289 (H) <150 mg/dL   HDL 54 >40 mg/dL   Total CHOL/HDL Ratio 5.9 RATIO   VLDL 58 (H) 0 - 40 mg/dL   LDL Cholesterol 207 (H) 0 - 99 mg/dL    Comment:        Total Cholesterol/HDL:CHD Risk Coronary Heart Disease Risk Table                     Men   Women  1/2 Average Risk   3.4   3.3  Average Risk       5.0   4.4  2 X Average Risk   9.6   7.1  3 X Average Risk  23.4   11.0        Use the calculated Patient Ratio above and the CHD Risk Table to determine the patient's CHD Risk.        ATP III CLASSIFICATION (LDL):  <100     mg/dL   Optimal  100-129  mg/dL   Near or Above                    Optimal  130-159  mg/dL   Borderline  160-189  mg/dL   High  >190     mg/dL   Very High Performed at [facility] [address], [place], [place] [address]   Basic metabolic panel     Status: Abnormal   Collection Time: 03/15/18  6:34 AM  Result Value Ref Range   Sodium 138 135 - 145 mmol/L   Potassium 3.8 3.5 - 5.1 mmol/L   Chloride 107 98 - 111 mmol/L   CO2 21 (L) 22 - 32 mmol/L   Glucose, Bld 123 (H) 70 - 99 mg/dL   BUN  13 6 - 20 mg/dL   Creatinine, Ser 0.77 0.44 - 1.00 mg/dL   Calcium 10.0 8.9 - 10.3 mg/dL   GFR calc non Af Amer >60 >60 mL/min   GFR calc Af Amer >60 >60 mL/min   Anion gap 10 5 - 15    Comment: Performed at [facility] [address] [place], [place] [address]  Glucose, CSF     Status: Abnormal   Collection Time: 03/15/18 10:59 AM    Result Value Ref Range   Glucose, CSF 84 (H) 40 - 70 mg/dL    Comment: Performed at [facility] [address] [place], [place] [address]  Protein, CSF     Status: None   Collection Time: 03/15/18 10:59 AM  Result Value Ref Range   Total  Protein, CSF 30 15 - 45 mg/dL    Comment: Performed at [facility] [address] [place], [place] [address]  VDRL, CSF     Status: None   Collection Time: 03/15/18 10:59 AM  Result Value Ref Range   VDRL Quant, CSF Non Reactive Non Rea:<1:1    Comment: (NOTE) Performed At: [facility] [place], [place] [id number] Rush Farmer MD OE:[id number]   CSF cell count with differential     Status: Abnormal   Collection Time: 03/15/18 10:59 AM  Result Value Ref Range   Tube # 3    Color, CSF COLORLESS COLORLESS   Appearance, CSF HAZY (A) CLEAR   Supernatant NOT INDICATED    RBC Count, CSF 3 (H) 0 /cu mm   WBC, CSF 10 (H) 0 - 5 /cu mm   Segmented Neutrophils-CSF 0 0 - 6 %   Lymphs, CSF 89 (H) 40 - 80 %   Monocyte-Macrophage-Spinal Fluid 11 (L) 15 - 45 %   Eosinophils, CSF 0 0 - 1 %    Comment: Performed at [facility] [address] [place], [place] [address]  CSF culture     Status: None   Collection Time: 03/15/18 10:59 AM  Result Value Ref Range   Specimen Description CSF    Special Requests NONE    Gram Stain      CYTOSPIN SMEAR WBC PRESENT, PREDOMINANTLY MONONUCLEAR NO ORGANISMS SEEN    Culture      NO GROWTH 3 DAYS Performed at [facility] [address] [place], [place] [address]    Report Status 03/18/2018 FINAL   Oligoclonal bands, CSF + serm     Status: None   Collection Time: 03/15/18 10:59 AM  Result Value Ref Range   CSF Oligoclonal Bands Comment     Comment: (NOTE) Zero (0) oligoclonal bands were observed in the CSF. Interpretation: Criteria for Positivity: Four (4) or more oligoclonal bands observed  only in the CSF have been shown to be most consistent with MS   using our method. [Fortini AS, Sanders EL, Weinshenker BG, and Katzmann JA: Cerebrospinal Fluid Oligoclonal Bands in the Diagnosis  of Multiple Sclerosis. Am J Clin Pathol 120(5):672-675, 2003]. Oligoclonal bands that are present only in the CSF have been associated with a variety of inflammatory brain diseases such as multiple sclerosis (MS), subacute encephalitis, neurosyphilis, etc.  Increased IgG in the CSF is not specific for MS, but is an indication  of chronic neural inflammation. Clinical correlation indicated. Approximately 2-3% of clinically confirmed MS patients show little or no evidence of oligoclonal bands in the CSF; however oligoclonal  bands  may develop as the disease progresses. Oligoclonal Banding testing performed using Isoelectric Focusing ( IEF) and immunoblotting methodology. Performed At: [facility] [address] [place], [place] [id number] Rush Farmer MD [id number]   Angiotensin converting enzyme     Status: None   Collection Time: 03/15/18  4:07 PM  Result Value Ref Range   Angiotensin-Converting Enzyme 33 14 - 82 U/L    Comment: (NOTE) Performed At: [facility] [place], [place] [id number] Rush Farmer MD [id number]   Sedimentation rate     Status: None   Collection Time: 03/15/18  4:24 PM  Result Value Ref Range   Sed Rate 10 0 - 22 mm/hr    Comment: Performed at [facility], [place] [address], [place], [place] [address]  C-reactive protein     Status: Abnormal   Collection Time: 03/15/18  4:24 PM  Result Value Ref Range   CRP 1.1 (H) <1.0 mg/dL    Comment: Performed at [facility], [place] [address] [place], [place] [address]  Pan-ANCA     Status: Abnormal   Collection Time: 03/18/18  1:13 PM  Result Value Ref Range   Myeloperoxidase Ab <9.0 0.0 - 9.0 U/mL   ANCA Proteinase 3 5.6 (H) 0.0 - 3.5 U/mL   C-ANCA <1:20 Neg:<1:20 titer   P-ANCA <1:20 Neg:<1:20 titer    Comment: The presence of  positive fluorescence exhibiting P-ANCA or C-ANCA patterns alone is not specific for the diagnosis of Wegener's Granulomatosis (WG) or microscopic polyangiitis. Decisions about treatment should not be based solely on ANCA IFA results.  The International ANCA Group Consensus recommends follow up testing of positive sera with both PR-3 and MPO-ANCA enzyme immunoassays. As many as 5% serum samples are positive only by EIA. Ref. AM J Clin Pathol 1999;111:507-513.    Atypical pANCA <1:20 Neg:<1:20 titer    Comment: The atypical pANCA pattern has been observed in a significant percentage of patients with ulcerative colitis, primary sclerosing cholangitis and autoimmune hepatitis.   VITAMIN D 25 Hydroxy (Vit-D Deficiency, Fractures)     Status: Abnormal   Collection Time: 03/18/18  1:13 PM  Result Value Ref Range   Vit D, 25-Hydroxy 18.4 (L) 30.0 - 100.0 ng/mL    Comment: Vitamin D deficiency has been defined by the [place] practice guideline as a level of serum 25-OH vitamin D less than 20 ng/mL (1,2). The Endocrine Society went on to further define vitamin D insufficiency as a level between 21 and 29 ng/mL (2). 1. IOM (Institute of Medicine). 2010. Dietary reference    intakes for calcium and D. [place]: The    [employer]. 2. Holick MF, Binkley [place], Bischoff-Ferrari HA, et al.    Evaluation, treatment, and prevention of vitamin D    deficiency: an Endocrine Society clinical practice    guideline. JCEM. 2011 Jul; 96(7):1911-30.   ANA w/Reflex     Status: None   Collection Time: 03/18/18  1:13 PM  Result Value Ref Range   Anti Nuclear Antibody(ANA) Negative Negative

## 2018-05-25 NOTE — Progress Notes (Signed)
Subjective:    Patient ID: Angel Rodriguez, female    DOB: [birth year], 43 y.o.   MRN: [id number]  HPI :  Angel Rodriguez presents with L lumbar back pain that spontaneously began 6 days ago.  She denies acute accident/injury prior to onset of pain. She reports intermittent pain that is described as "dull ache and pressure", rated 8/10- 10/10. She reports urinary frequency, denies dysuria or abdominal pain. She denies numbness/weakness in lower extremities She reports loose stools the last 2 weeks, however abruptly stopped Tylenol #3, Buspirone, and Clonazepam approximately 19 days ago. She reports heating pad and OTC Acetaminophen and heating pad slightly decreased pain. She reports prolonged sitting, standing will worsen pain. She denies hematochezia/hematuria  She denies hx of re-current UTI She estimates to drink 60-80z water/day  Of Note- Pt is poor historian and slurred speech noted.  Patient Care Team    Relationship Specialty Notifications Start End  Mellody Dance, DO PCP - General Family Medicine  05/09/18   Earlie Raveling, NP Nurse Practitioner Nurse Practitioner  05/09/18     Patient Active Problem List   Diagnosis Date Noted  . Mood disorder ([place]) 05/09/2018  . Episode of recurrent major depressive disorder ([place]) 05/09/2018  . Mental confusion/ slurred speech 05/09/2018  . High risk medications (not anticoagulants) long-term use 05/09/2018  . Numbness 03/18/2018  . Vitamin D deficiency 03/18/2018  . Thrombocytopenia ([place]) 03/14/2018  . Dysarthria 03/14/2018  . Brain lesion 03/14/2018  . Right sided weakness 03/13/2018  . 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 Abnormal MRI of head 01/21/2016  . Vascular headache 09/15/2015  . Insomnia 09/15/2015  . Headache 07/08/2015  . Headache, chronic migraine without aura, intractable 07/08/2015  . Anxiety 07/08/2015  . Neck pain 06/24/2015  . Seizure ([place]) 06/24/2015  . Central retinal vein occlusion 12/11/2011  .  Cotton wool exudates 12/11/2011  . Cystoid macular edema 12/11/2011     Past Medical History:  Diagnosis Date  . Anxiety   . 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 Cluster headaches    started after seizure and migraines  . Complication of anesthesia    trouble with short term memory after surgeries  . Depression   . Fibroids    s/p TLH, bilateral salpingectomy  . GERD (gastroesophageal reflux disease)    patient thinks due to medications  . History of anemia    prior to hysterectomy  . History of pneumonia   . History of trichomoniasis   . Seizures ([place]) x 1 2-3 yrs ago none since   once saw neurologist, full body brain MRI and another 6 months lster  . Status post right oophorectomy   . Thrombocytopenia ([place])    sees [place] with Heme Onc     Past Surgical History:  Procedure Laterality Date  . CYSTOSCOPY N/A 07/31/2017   Procedure: CYSTOSCOPY;  Surgeon: Megan Salon, MD;  Location: [facility];  Service: Gynecology;  Laterality: N/A;  . ENDOMETRIAL ABLATION    . EYE SURGERY     laser - removed blood from right eye  . HYSTEROSCOPY W/D&C    . LAPAROSCOPY N/A 09/19/2017   Procedure: LAPAROSCOPY DIAGNOSTIC WITH RIGHT OOPHERECTOMY, LYSIS OF ADHESIONS;  Surgeon: Salvadore Dom, MD;  Location: [facility];  Service: Gynecology;  Laterality: N/A;  . OMENTECTOMY N/A 11/21/2017   Procedure: OMENTECTOMY AND PELVIC WASHINGS;  Surgeon: Isabel Caprice, MD;  Location: [facility];  Service: Gynecology;  Laterality: N/A;  .  ROBOTIC ASSISTED SALPINGO OOPHERECTOMY Left 11/21/2017   Procedure: XI ROBOTIC ASSISTED LEFT SALPINGO OOPHORECTOMY;  Surgeon: Isabel Caprice, MD;  Location: [facility];  Service: Gynecology;  Laterality: Left;  . TOTAL LAPAROSCOPIC HYSTERECTOMY WITH SALPINGECTOMY Bilateral 07/31/2017   Procedure: TOTAL LAPAROSCOPIC HYSTERECTOMY WITH SALPINGECTOMY;  Surgeon: Megan Salon, MD;  Location: [facility];  Service: Gynecology;  Laterality: Bilateral;  20 week size uterus/ Alexis  bag in room/ need 4.5 hours  . TUBAL LIGATION    . WISDOM TOOTH EXTRACTION       Family History  Problem Relation Age of Onset  . Alcoholism Father   . Cirrhosis Father   . Cancer Maternal Grandmother        unsure of type; died of other causes  . Heart disease Maternal Aunt      Social History   Substance and Sexual Activity  Drug Use No     Social History   Substance and Sexual Activity  Alcohol Use Yes   Comment: occ wine     Social History   Tobacco Use  Smoking Status Current Some Day Smoker  . Years: 20.00  . Types: E-cigarettes, Cigarettes  Smokeless Tobacco Never Used  Tobacco Comment   occasional cigerate and vap     Outpatient Encounter Medications as of 05/27/2018  Medication Sig  . atropine 1 % ophthalmic solution Place 1 drop into the right eye 3 (three) times daily.   . brimonidine (ALPHAGAN P) 0.1 % SOLN Place 1 drop into the right eye 3 (three) times daily.   . calcium carbonate (TUMS EX) 750 MG chewable tablet Chew 2 tablets by mouth as needed for heartburn.  . Cyanocobalamin (VITAMIN B-12 PO) Take 1 tablet by mouth daily.   . dorzolamide-timolol (COSOPT) 22.3-6.8 MG/ML ophthalmic solution Place 1 drop into the right eye 2 (two) times daily.   [name] FLUoxetine (PROZAC) 20 MG capsule Take 1 capsule (20 mg total) by mouth daily. (Patient taking differently: Take 20 mg by mouth daily as needed (anxiety). )  . fluticasone (FLONASE) 50 MCG/ACT nasal spray Place 1-2 sprays into both nostrils daily as needed for allergies.   [name] latanoprost (XALATAN) 0.005 % ophthalmic solution Place 1 drop into both eyes at bedtime.  . levETIRAcetam 500 mg in sodium chloride 0.9 % 100 mL Inject 500 mg into the vein every 12 (twelve) hours.  . Multiple Vitamin (MULTI-VITAMINS) TABS Take 1 tablet by mouth daily.   . SUMAtriptan (IMITREX) 100 MG tablet Take 1 tablet (100 mg total) by mouth once as needed for migraine. May repeat in 2 hours if headache persists or recurs.  .  verapamil (CALAN-SR) 240 MG CR tablet Take 1 tablet (240 mg total) by mouth at bedtime. (Patient taking differently: Take 240 mg by mouth at bedtime. For migraines)  . Vitamin D, Ergocalciferol, (DRISDOL) 1.25 MG (50000 UT) CAPS capsule Take 1 capsule (50,000 Units total) by mouth every 7 (seven) days.  . [DISCONTINUED] acetaminophen-codeine (TYLENOL #3) 300-30 MG tablet TAKE 1 TO 2 TABS BY MOUTH q 8 hrs prn severe headache.  No more than 6 in any day.  . [DISCONTINUED] bisacodyl (DULCOLAX) 5 MG EC tablet Take 1 tablet (5 mg total) by mouth daily as needed for moderate constipation.  . [DISCONTINUED] busPIRone (BUSPAR) 15 MG tablet TAKE 1 TABLET BY MOUTH 2 TIMES DAILY.  . [DISCONTINUED] clonazePAM (KLONOPIN) 0.5 MG tablet Take 1 tablet (0.5 mg total) by mouth 2 (two) times daily as needed. for anxiety  . [DISCONTINUED]  methylPREDNISolone (MEDROL DOSEPAK) 4 MG TBPK tablet Use as directed   No facility-administered encounter medications on file as of 05/27/2018.     Allergies: Ivp dye [iodinated diagnostic agents] and Bee venom  Body mass index is 35 kg/m.  Blood pressure 129/81, pulse (!) 55, temperature 98.4 F (36.9 C), temperature source Oral, height 5' (1.524 m), weight 179 lb 3.2 oz (81.3 kg), last menstrual period 05/02/2017, SpO2 100 %.  Review of Systems  Constitutional: Positive for fatigue. Negative for activity change, appetite change, chills, diaphoresis, fever and unexpected weight change.  Respiratory: Negative for cough, chest tightness, shortness of breath, wheezing and stridor.   Cardiovascular: Negative for chest pain, palpitations and leg swelling.  Gastrointestinal: Positive for diarrhea. Negative for abdominal distention, abdominal pain, blood in stool, constipation, nausea and vomiting.  Endocrine: Negative for cold intolerance, heat intolerance, polydipsia, polyphagia and polyuria.  Genitourinary: Positive for frequency. Negative for difficulty urinating, dysuria, flank  pain, hematuria, pelvic pain and urgency.  Musculoskeletal: Positive for arthralgias, back pain, myalgias, neck pain and neck stiffness. Negative for gait problem and joint swelling.  Neurological: Positive for headaches. Negative for dizziness.  Hematological: Does not bruise/bleed easily.  Psychiatric/Behavioral: Positive for confusion and dysphoric mood. Negative for agitation, behavioral problems, decreased concentration, hallucinations, self-injury, sleep disturbance and suicidal ideas. The patient is nervous/anxious. The patient is not hyperactive.        Objective:   Physical Exam Vitals signs and nursing note reviewed.  Constitutional:      General: She is not in acute distress.    Appearance: She is not ill-appearing, toxic-appearing or diaphoretic.  HENT:     Head: Normocephalic and atraumatic.  Eyes:     Extraocular Movements: Extraocular movements intact.     Conjunctiva/sclera: Conjunctivae normal.     Pupils: Pupils are equal, round, and reactive to light.  Cardiovascular:     Rate and Rhythm: Bradycardia present.     Pulses: Normal pulses.     Heart sounds: Normal heart sounds. No murmur. No friction rub. No gallop.   Pulmonary:     Effort: Pulmonary effort is normal. No respiratory distress.     Breath sounds: Normal breath sounds. No stridor. No wheezing, rhonchi or rales.  Chest:     Chest wall: No tenderness.  Musculoskeletal: Normal range of motion.        General: Tenderness present.     Lumbar back: She exhibits tenderness. She exhibits normal range of motion, no bony tenderness, no swelling and no spasm.  Skin:    General: Skin is warm and dry.     Capillary Refill: Capillary refill takes less than 2 seconds.  Neurological:     Mental Status: She is alert and oriented to person, place, and time.  Psychiatric:        Attention and Perception: Attention normal.        Mood and Affect: Mood is anxious.        Speech: Speech is slurred.        Behavior:  Behavior is cooperative.        Thought Content: Thought content normal.        Cognition and Memory: She exhibits impaired recent memory.       Assessment & Plan:   1. Flank pain   2. Acute left-sided low back pain without sciatica     Flank pain UA negative  Acute left-sided low back pain without sciatica Due to narcotic and anxiolytic use- not appropriate to provide  sedating muscle relaxers or narcotics Due to anxious state and underlying hx of anxiety, prednisone not appropriate Please follow care instructions as listed above. Recommend OTC Acetaminophen  Your urinalysis is completely normal. Please keep your follow-up with Neurologist on 06/27/2018. Since you have been off your narcotics and anti-anxiety medications for almost 3 weeks, remain off. If back pain does not improve in 3 weeks, please follow-up with Dr. Raliegh Scarlet.    FOLLOW-UP:  Return if symptoms worsen or fail to improve. 1. Flank pain     No problem-specific Assessment & Plan notes found for this encounter.    FOLLOW-UP:  Return if symptoms worsen or fail to improve.

## 2018-05-27 ENCOUNTER — Ambulatory Visit (INDEPENDENT_AMBULATORY_CARE_PROVIDER_SITE_OTHER): Payer: No Typology Code available for payment source | Admitting: Adult Health

## 2018-05-27 ENCOUNTER — Encounter: Payer: Self-pay | Admitting: Adult Health

## 2018-05-27 ENCOUNTER — Other Ambulatory Visit: Payer: Self-pay

## 2018-05-27 VITALS — BP 129/81 | HR 55 | Temp 98.4°F | Ht 60.0 in | Wt 179.2 lb

## 2018-05-27 DIAGNOSIS — M545 Low back pain, unspecified: Secondary | ICD-10-CM

## 2018-05-27 DIAGNOSIS — R109 Unspecified abdominal pain: Secondary | ICD-10-CM | POA: Diagnosis not present

## 2018-05-27 LAB — POCT URINALYSIS DIPSTICK
Bilirubin, UA: NEGATIVE
Blood, UA: NEGATIVE
Glucose, UA: NEGATIVE
Ketones, UA: NEGATIVE
Leukocytes, UA: NEGATIVE
NITRITE UA: NEGATIVE
PH UA: 7 (ref 5.0–8.0)
Protein, UA: NEGATIVE
Spec Grav, UA: 1.015 (ref 1.010–1.025)
Urobilinogen, UA: 0.2 E.U./dL

## 2018-05-27 NOTE — Assessment & Plan Note (Signed)
Due to narcotic and anxiolytic use- not appropriate to provide sedating muscle relaxers or narcotics Due to anxious state and underlying hx of anxiety, prednisone not appropriate Please follow care instructions as listed above. Recommend OTC Acetaminophen  Your urinalysis is completely normal. Please keep your follow-up with Neurologist on 06/27/2018. Since you have been off your narcotics and anti-anxiety medications for almost 3 weeks, remain off. If back pain does not improve in 3 weeks, please follow-up with Dr. Raliegh Scarlet.

## 2018-05-27 NOTE — Assessment & Plan Note (Signed)
UA negative

## 2018-05-27 NOTE — Patient Instructions (Addendum)
Acute Back Pain, Adult Acute back pain is sudden and usually short-lived. It is often caused by an injury to the muscles and tissues in the back. The injury may result from:  A muscle or ligament getting overstretched or torn (strained). Ligaments are tissues that connect bones to each other. Lifting something improperly can cause a back strain.  Wear and tear (degeneration) of the spinal disks. Spinal disks are circular tissue that provides cushioning between the bones of the spine (vertebrae).  Twisting motions, such as while playing sports or doing yard work.  A hit to the back.  Arthritis. You may have a physical exam, lab tests, and imaging tests to find the cause of your pain. Acute back pain usually goes away with rest and home care. Follow these instructions at home: Managing pain, stiffness, and swelling  Take over-the-counter and prescription medicines only as told by your health care provider.  Your health care provider may recommend applying ice during the first 24-48 hours after your pain starts. To do this: ? Put ice in a plastic bag. ? Place a towel between your skin and the bag. ? Leave the ice on for 20 minutes, 2-3 times a day.  If directed, apply heat to the affected area as often as told by your health care provider. Use the heat source that your health care provider recommends, such as a moist heat pack or a heating pad. ? Place a towel between your skin and the heat source. ? Leave the heat on for 20-30 minutes. ? Remove the heat if your skin turns bright red. This is especially important if you are unable to feel pain, heat, or cold. You have a greater risk of getting burned. Activity   Do not stay in bed. Staying in bed for more than 1-2 days can delay your recovery.  Sit up and stand up straight. Avoid leaning forward when you sit, or hunching over when you stand. ? If you work at a desk, sit close to it so you do not need to lean over. Keep your chin tucked  in. Keep your neck drawn back, and keep your elbows bent at a right angle. Your arms should look like the letter "L." ? Sit high and close to the steering wheel when you drive. Add lower back (lumbar) support to your car seat, if needed.  Take short walks on even surfaces as soon as you are able. Try to increase the length of time you walk each day.  Do not sit, drive, or stand in one place for more than 30 minutes at a time. Sitting or standing for long periods of time can put stress on your back.  Do not drive or use heavy machinery while taking prescription pain medicine.  Use proper lifting techniques. When you bend and lift, use positions that put less stress on your back: ? Bend your knees. ? Keep the load close to your body. ? Avoid twisting.  Exercise regularly as told by your health care provider. Exercising helps your back heal faster and helps prevent back injuries by keeping muscles strong and flexible.  Work with a physical therapist to make a safe exercise program, as recommended by your health care provider. Do any exercises as told by your physical therapist. Lifestyle  Maintain a healthy weight. Extra weight puts stress on your back and makes it difficult to have good posture.  Avoid activities or situations that make you feel anxious or stressed. Stress and anxiety increase muscle   tension and can make back pain worse. Learn ways to manage anxiety and stress, such as through exercise. General instructions  Sleep on a firm mattress in a comfortable position. Try lying on your side with your knees slightly bent. If you lie on your back, put a pillow under your knees.  Follow your treatment plan as told by your health care provider. This may include: ? Cognitive or behavioral therapy. ? Acupuncture or massage therapy. ? Meditation or yoga. Contact a health care provider if:  You have pain that is not relieved with rest or medicine.  You have increasing pain going down  into your legs or buttocks.  Your pain does not improve after 2 weeks.  You have pain at night.  You lose weight without trying.  You have a fever or chills. Get help right away if:  You develop new bowel or bladder control problems.  You have unusual weakness or numbness in your arms or legs.  You develop nausea or vomiting.  You develop abdominal pain.  You feel faint. Summary  Acute back pain is sudden and usually short-lived.  Use proper lifting techniques. When you bend and lift, use positions that put less stress on your back.  Take over-the-counter and prescription medicines and apply heat or ice as directed by your health care provider. This information is not intended to replace advice given to you by your health care provider. Make sure you discuss any questions you have with your health care provider. Document Released: 02/20/2005 Document Revised: 09/27/2017 Document Reviewed: 10/04/2016 Elsevier Interactive Patient Education  2019 [place]!

## 2018-06-06 ENCOUNTER — Other Ambulatory Visit: Payer: Self-pay

## 2018-06-06 ENCOUNTER — Encounter (HOSPITAL_COMMUNITY): Payer: Self-pay | Admitting: Psychiatry

## 2018-06-06 ENCOUNTER — Ambulatory Visit (INDEPENDENT_AMBULATORY_CARE_PROVIDER_SITE_OTHER): Payer: Self-pay | Admitting: Psychiatry

## 2018-06-06 DIAGNOSIS — F321 Major depressive disorder, single episode, moderate: Secondary | ICD-10-CM

## 2018-06-06 DIAGNOSIS — F410 Panic disorder [episodic paroxysmal anxiety] without agoraphobia: Secondary | ICD-10-CM

## 2018-06-06 DIAGNOSIS — F324 Major depressive disorder, single episode, in partial remission: Secondary | ICD-10-CM | POA: Insufficient documentation

## 2018-06-06 MED ORDER — HYDROXYZINE HCL 25 MG PO TABS: 25.0000 mg | ORAL_TABLET | Freq: Three times a day (TID) | ORAL | 0 refills | Status: DC | PRN

## 2018-06-06 MED ORDER — VORTIOXETINE HBR 5 MG PO TABS: 5.0000 mg | ORAL_TABLET | Freq: Every day | ORAL | 0 refills | Status: DC
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
## 2018-06-13 ENCOUNTER — Encounter: Payer: PRIVATE HEALTH INSURANCE | Admitting: Family Medicine

## 2018-06-13 ENCOUNTER — Other Ambulatory Visit: Payer: Self-pay

## 2018-06-13 DIAGNOSIS — Z79899 Other long term (current) drug therapy: Secondary | ICD-10-CM

## 2018-06-13 DIAGNOSIS — R569 Unspecified convulsions: Secondary | ICD-10-CM

## 2018-06-13 DIAGNOSIS — Z0000 Encounter for general adult medical examination without abnormal findings: Secondary | ICD-10-CM

## 2018-06-13 DIAGNOSIS — D696 Thrombocytopenia, unspecified: Secondary | ICD-10-CM

## 2018-06-13 DIAGNOSIS — E559 Vitamin D deficiency, unspecified: Secondary | ICD-10-CM

## 2018-06-25 ENCOUNTER — Telehealth: Payer: Self-pay | Admitting: *Deleted

## 2018-06-25 NOTE — Telephone Encounter (Signed)
Called pt. She consented to a virtual visit on 06/27/18 at 830am with Dr. Felecia Shelling. I updated her med list/pharmacy/allergy list.   Email: [email or web address]. Advised her to call back if she does not receive email today. She will use iphone for virtual visit.

## 2018-06-27 ENCOUNTER — Telehealth: Payer: Self-pay | Admitting: Neurology

## 2018-06-27 ENCOUNTER — Encounter: Payer: Self-pay | Admitting: Neurology

## 2018-06-27 ENCOUNTER — Telehealth: Payer: Self-pay

## 2018-06-27 ENCOUNTER — Ambulatory Visit (INDEPENDENT_AMBULATORY_CARE_PROVIDER_SITE_OTHER): Payer: PRIVATE HEALTH INSURANCE | Admitting: Neurology

## 2018-06-27 DIAGNOSIS — R200 Anesthesia of skin: Secondary | ICD-10-CM

## 2018-06-27 DIAGNOSIS — H348112 Central retinal vein occlusion, right eye, stable: Secondary | ICD-10-CM | POA: Diagnosis not present

## 2018-06-27 DIAGNOSIS — G441 Vascular headache, not elsewhere classified: Secondary | ICD-10-CM | POA: Diagnosis not present

## 2018-06-27 DIAGNOSIS — R930 Abnormal findings on diagnostic imaging of skull and head, not elsewhere classified: Secondary | ICD-10-CM | POA: Diagnosis not present

## 2018-06-27 DIAGNOSIS — F39 Unspecified mood [affective] disorder: Secondary | ICD-10-CM

## 2018-06-27 MED ORDER — ACETAMINOPHEN-CODEINE #3 300-30 MG PO TABS: ORAL_TABLET | ORAL | 3 refills | Status: DC

## 2018-06-27 NOTE — Progress Notes (Signed)
[facility]  PATIENT: Angel Rodriguez DOB: 1975/07/18  REFERRING DOCTOR OR PCP:   SOURCE: patient, ED records, images on PACS, reports in EMR  _________________________________   HISTORICAL  CHIEF COMPLAINT:  Chief Complaint  Patient presents with   Headache    HISTORY OF PRESENT ILLNESS:  Angel Rodriguez is a 43 y.o. woman with frequent headaches and h/o seizure.  Update 06/27/2018: Virtual Visit via Video Note I connected with Angel Rodriguez on 06/27/18 at  8:30 AM EDT by a video enabled telemedicine application and verified that I am speaking with the correct person.  I discussed the limitations of evaluation and management by telemedicine and the availability of in person appointments. The patient expressed understanding and agreed to proceed.  History of Present Illness: She continues to experience headaches.  These are intermittent and are always right-sided worse than left side.  Most headaches are associated with nausea, photophobia and phonophobia.  Some are associated with erythema of the right eye.  Imitrex does not always help.  Tylenol 3 often helps..  She is not noting vision changes with the headaches..  She is having these migrainous headaches two days a week.   She does not have one right now.    In January she had neurologic symptoms including numbness and weakness in her right hand.  Details are in my visit notes from that day.  She feels she is better and she had complete resolution of the right-sided symptoms.  The MRI is unusual with 3 juxtacortical and subcortical enhancing lesions, 2 of which were present on MRI 02/14/2016 with 1 of them enhancing at that time.  An MRI 07/02/2015 showed the 2 previous lesions without enhancement.  She was supposed to get a second opinion in late March at [facility] but it was rescheduled due to Covid-19.  I encouraged her to follow through as I do not have a clear diagnosis for her symptoms and MRI changes.  We discussed  that the numbness and clumsiness in the hands she experienced could be explained by the enhancing lesions but the etiology is unclear  She feels stressed and is anxious.   She sees psychiatry.   She is on Trintellix and prn hydroxyzine to help with anxiety.       Observations/Objective:  She is a well-developed well-nourished woman in no acute distress.  The head is normocephalic and atraumatic.  Sclera are anicteric.  Visible skin appears normal.  The neck has a good range of motion.  Pharynx and tongue have normal appearance.  She is alert and fully oriented with fluent speech and good attention, knowledge and memory.  Extraocular muscles are intact.  Facial strength is normal.  Palatal elevation and tongue protrusion are midline.  She appears to have normal strength in the arms.  Rapid alternating movements and finger-nose-finger are performed well.   Assessment and Plan: Vascular headache  Central retinal vein occlusion of right eye, unspecified complication status  Abnormal MRI of head  Numbness  Mood disorder ([place])  1.   She will follow-up with her rescheduled visit for second opinion regarding her abnormal MRI with enhancing lesions and headaches and history of retinal vein occlusion.  Despite the blood work, I am concerned about the possibility of vasculitis. 2.   I will renew the Tylenol 3 No. 60 to take when she has a headache.  She has been advised by ophthalmology not to take NSAIDs. 3.   Stay active and exercise as tolerated.  We discussed safety and CDC guidelines during the COVID crisis.  She is continuing to work and wears a mask. 4.   Return to see me in 4 months or sooner if new or worsening neurologic symptoms.  Follow Up Instructions: I discussed the assessment and treatment plan with the patient. The patient was provided an opportunity to ask questions and all were answered. The patient agreed with the plan and demonstrated an understanding of the instructions.    The  patient was advised to call back or seek an in-person evaluation if the symptoms worsen or if the condition fails to improve as anticipated.  I provided 25 minutes of non-face-to-face time during this encounter. _____________________________________ From previous visits: Update 03/18/2018: She was admitted to the hospital last week after presenting with right hand numbness, weakness and clumsiness.  She called a work acquaintance during the visit to describe her symptoms.  Although she seemed dazed while the symptoms were occurring before she left to go to the hospital, she did not have any generalized tonic-clonic activity.  She was less responsive but was able to understand what was being said to her and was communicating.  Numbness spread up over ten minutes to the entire leg and then was followed by the rest of the right side, including the face.   She had a mild headache but less severe than many of the headaches she has had in the past.      Notes from her hospital stay and multiple laboratory results and imaging studies were reviewed.  There was a concern that she might of had a seizure and she was started on Keppra.  She had imaging performed last week.  I personally reviewed the MRI images on PACS.    She has the 2 foci seen in 2017 in the left frontal subcortical white matter but the one focus has some enhancement and is larger.  The other is unchanged.   There is a newer midline subcortical focus, also on the left that enhances.  No spinal plaques.  She also had an LP.  Cells were mildly elevated (10 WBC  89% lymphs; 11% mono) and protein was normal.   Glucose was slightly elevated at 84.  OCB is pending.  VDRL is pending.    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   In 2016, she had a seizure.  MRI showed 2 moderate size nonenhancing subcortical foci in the left frontal lobe.   A repeat MRI 02/14/2016 showed 2  juxtacortical left frontal lobe lesions.  Both were present on the 06/30/2015 MRI.  There was punctate enhancement adjacent to the more anterior frontal focus that had not been present on the previous MRI.  Diffusion weighted images were normal.  Between 2017 and 2019, she was seen multiple times for right-sided headaches.  Intranasal sphenopalatine ganglion block or up of rapid benefit.  Additionally verapamil seem to reduce the frequency.  Update 02/05/2018: She reports that headaches are back. They occur daily with migrainous features of nausea and photophobia/phonophobia.   They returned over a month ago.    She also notes that the right eye becomes red.     She feels stress worsens the headaches.     Movements worsen them.    She is taking Imitrex with benefit but headaches return.       She is on verapamil and that seemed  to help her some for a while.     Headaches occur 30/30 of the last days and for > 4 hours/day.       Last sphenocath procedure was April 2019 and it knocked headaches out completely x 1 month and then they returned mildly.     She has a h/o central retinal vein occlusion and is on drops ([facility])  She had a single seizure in March 2016 and is not on an anticonvulsant.    Insomnia is doing well on her medications.     Update 10/25/2017: Her headaches are doing better since the sphenocath procedure and the trigger point injections.    She still has some headache pain but it is not severe.    Pain sometimes builds up some as the day goes on, especially at work.     She denies any numbness, weakness or gait change.  She sees [place] for her h/o central retinal vein occlusion.  She is still on eye drops.      She only had one seizure 3 years ago and is not on any anticonvulsant.       She has some insomnia.    She has trouble falling asleep and staying asleep.   She changed her clonazepma to 0.25 qAM and 0.75 mg qPM.  She has ovarian cancer and had surgery.   She had  ovarian torsion but pathology showed possible cancer so she is going to have another operation.      Update 06/27/2017: HA's worsened again last week.    The conjunctivitis returned the next day.   All the pain is on her right side only.     In the past sphenopalatine ganglion blocks have helped the most.    She also has received some benefit form ONB/TPIs in the past.    She denies any additional seizures.      Insomnia was doing better and was helped by clonazepam.  However, she is doing worse since the headache has recurred.  Clonazepam also helps her anxiety.   Update 04/20/2017: Headaches are doing fairly well.   She is now having 3-4 HA's a month.    Verapamil has helped reduce the frequency of headaches.     When needed, Imitrex often helps.     She will sometimes try Tylenol first.   Due to low platelets, she can't take NSAIDs.     The TCPenia was felt to be due to fibroids and these are being treated.     She still gets redness in the right eye, not always associated with HA.    In the past when her headaches were more severe, she had the Sphenocath procedure and it was very beneficial.    Occipital nerve blocks or TPI's have also been beneficial in the past.    She has not had any more seizure.   She just had one in 2016   From 07/11/2016: HA:   Her headaches were doing much better but they are have been occurring more the past month or two. Additionally she continues to have conjunctival redness on the right.     They greatly improved after starting Verapamil and having occipital nerve block/splenius capitis muscle injection at the first visit and then [place] at the second, third and fourth visits.  After the second Sphenocath, the conjunctival redness got much better but then returned. After the fourth procedure, she went nearly pain-free for 6 months until milder headaches returned and activities  slowly worsening. Pain improves  with Imitrex and rest much of the time.  She no longer can  take NSAID    She tolerates the verapamil well but has occasional constipation .     Studies:   MRI of the brain showed a single small T2/FLAIR hyperintense focus in the left frontal subcortical white matter. The MR venogram was normal. ESR and ANA were normal.    Mood:  Anxiety is much better.   Clonazepam with Buspar has helped better than the escitalopram.    Seizure:   In March 2016, she had a seizure.   She had an aura of numbness in her right hand and then passed out.   She is not sure there was any generalized tonic-clonic activity. Her children did not tell her that she had any. However, she did have urinary incontinence. She does not remember much that day.    Since then, she has not had any more seizures.  She has not needed any AED as she had only the one seizure/spell.       Insomnia:   She is sleeping better since starting clonazepam and having much less headache  Thrombocytopenia:   Her platelet count was reduced to 78,000 and she saw her doctor in hematology. Repeat platelet count was 83,000. She was advised to stop anti-inflammatories.  REVIEW OF SYSTEMS: Constitutional: No fevers, chills, sweats, or change in appetite.  She has insomnia. Eyes: as above Ear, nose and throat: No hearing loss, ear pain, nasal congestion, sore throat Cardiovascular: No chest pain, palpitations Respiratory: No shortness of breath at rest or with exertion.   No wheezes GastrointestinaI: No nausea, vomiting, diarrhea, abdominal pain, fecal incontinence Genitourinary: No dysuria, urinary retention or frequency.  No nocturia.   Possible ovarian cancer Musculoskeletal: No neck pain, back pain Integumentary: No rash, pruritus, skin lesions Neurological: as above Psychiatric: No depression at this time.  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 Allergic/Immunologic: No itchy/runny eyes, nasal congestion, recent allergic reactions, rashes  ALLERGIES: Allergies  Allergen Reactions   Ivp Dye [Iodinated Diagnostic Agents] Hives, Itching and Swelling   Bee Venom Swelling    HOME MEDICATIONS:  Current Outpatient Medications:    acetaminophen-codeine (TYLENOL #3) 300-30 MG tablet, One to two prn headache.  No mor ethan 4/day, Disp: 60 tablet, Rfl: 3   atropine 1 % ophthalmic solution, Place 1 drop into the right eye 3 (three) times daily. , Disp: , Rfl:    brimonidine (ALPHAGAN P) 0.1 % SOLN, Place 1 drop into the right eye 3 (three) times daily. , Disp: , Rfl:    calcium carbonate (TUMS EX) 750 MG chewable tablet, Chew 2 tablets by mouth as needed for heartburn., Disp: , Rfl:    Cyanocobalamin (VITAMIN B-12 PO), Take 1 tablet by mouth daily. , Disp: , Rfl:    dorzolamide-timolol (COSOPT) 22.3-6.8 MG/ML ophthalmic solution, Place 1 drop into the right eye 2 (two) times daily. , Disp: , Rfl:    fluticasone (FLONASE) 50 MCG/ACT nasal spray, Place 1-2 sprays into both nostrils daily as needed for allergies. , Disp: , Rfl:    hydrOXYzine (ATARAX/VISTARIL) 25 MG tablet, Take 1 tablet (25 mg total) by mouth 3 (three) times daily as needed for anxiety., Disp: 30 tablet, Rfl: 0   latanoprost (XALATAN) 0.005 % ophthalmic solution, Place 1  drop into both eyes at bedtime., Disp: , Rfl:    levETIRAcetam (KEPPRA) 500 MG tablet, Take 500 mg by mouth 2 (two) times daily., Disp: , Rfl:    Multiple Vitamin (MULTI-VITAMINS) TABS, Take 1 tablet by mouth daily. , Disp: , Rfl:    SUMAtriptan (IMITREX) 100 MG tablet, Take 1 tablet (100 mg total) by mouth once as needed for migraine. May repeat in 2 hours if headache persists or recurs., Disp: 18 tablet, Rfl: 3   verapamil (CALAN-SR) 240 MG CR tablet, Take 1 tablet (240 mg total) by mouth at bedtime. (Patient taking differently: Take  240 mg by mouth at bedtime. For migraines), Disp: 90 tablet, Rfl: 3   Vitamin D, Ergocalciferol, (DRISDOL) 1.25 MG (50000 UT) CAPS capsule, Take 1 capsule (50,000 Units total) by mouth every 7 (seven) days., Disp: 12 capsule, Rfl: 1   vortioxetine HBr (TRINTELLIX) 5 MG TABS tablet, Take 1 tablet (5 mg total) by mouth daily for 30 days., Disp: 30 tablet, Rfl: 0  PAST MEDICAL HISTORY: Past Medical History:  Diagnosis Date   Anxiety    Blind right eye 2009   swelling & pressure   Cluster headaches    started after seizure and migraines   Complication of anesthesia    trouble with short term memory after surgeries   Depression    Fibroids    s/p TLH, bilateral salpingectomy   GERD (gastroesophageal reflux disease)    patient thinks due to medications   History of anemia    prior to hysterectomy   History of pneumonia    History of trichomoniasis    Seizures ([place]) x 1 2-3 yrs ago none since   once saw neurologist, full body brain MRI and another 6 months lster   Status post right oophorectomy    Thrombocytopenia ([place])    sees [place] with Heme Onc    PAST SURGICAL HISTORY: Past Surgical History:  Procedure Laterality Date   CYSTOSCOPY N/A 07/31/2017   Procedure: CYSTOSCOPY;  Surgeon: Megan Salon, MD;  Location: [facility];  Service: Gynecology;  Laterality: N/A;   ENDOMETRIAL ABLATION     EYE SURGERY     laser - removed blood from right eye   HYSTEROSCOPY W/D&C     LAPAROSCOPY N/A 09/19/2017   Procedure: LAPAROSCOPY DIAGNOSTIC WITH RIGHT OOPHERECTOMY, LYSIS OF ADHESIONS;  Surgeon: Salvadore Dom, MD;  Location: [facility];  Service: Gynecology;  Laterality: N/A;   OMENTECTOMY N/A 11/21/2017   Procedure: OMENTECTOMY AND PELVIC WASHINGS;  Surgeon: Isabel Caprice, MD;  Location: [facility];  Service: Gynecology;  Laterality: N/A;   ROBOTIC ASSISTED SALPINGO OOPHERECTOMY Left 11/21/2017   Procedure: XI ROBOTIC ASSISTED LEFT SALPINGO OOPHORECTOMY;  Surgeon: Isabel Caprice, MD;  Location: [facility];  Service: Gynecology;  Laterality: Left;   TOTAL LAPAROSCOPIC HYSTERECTOMY WITH SALPINGECTOMY Bilateral 07/31/2017   Procedure: TOTAL LAPAROSCOPIC HYSTERECTOMY WITH SALPINGECTOMY;  Surgeon: Megan Salon, MD;  Location: [facility];  Service: Gynecology;  Laterality: Bilateral;  20 week size uterus/ Alexis bag in room/ need 4.5 hours   TUBAL LIGATION     WISDOM TOOTH EXTRACTION      FAMILY HISTORY: Family History  Problem Relation Age of Onset   Alcoholism Father    Cirrhosis Father    Alcohol abuse Father    Cancer Maternal Grandmother        unsure of type; died of other causes   Heart disease Maternal Aunt    Drug abuse Cousin  SOCIAL HISTORY:  Social History   Socioeconomic History   Marital status: Single    Spouse name: Not on file   Number of children: Not on file   Years of education: Not on file   Highest education level: Not on file  Occupational History   Not on file  Social Needs   Financial resource strain: Not on file   Food insecurity:    Worry: Not on file    Inability: Not on file   Transportation needs:    Medical: Not on file    Non-medical: Not on file  Tobacco Use   Smoking status: Current Some Day Smoker    Years: 20.00    Types: E-cigarettes, Cigarettes   Smokeless tobacco: Never Used   Tobacco comment: occasional cigerate and vap  Substance and Sexual Activity   Alcohol use: Yes    Comment: occ wine   Drug use: No   Sexual activity: Yes    Birth control/protection: Surgical    Comment: hysterectomy  Lifestyle   Physical activity:    Days per week: Not on file    Minutes per session: Not on file   Stress: Not on file  Relationships   Social connections:    Talks on phone: Not on file    Gets together: Not on file    Attends religious service: Not on file    Active member of club or organization: Not on file    Attends meetings of clubs or organizations: Not on file     Relationship status: Not on file   Intimate partner violence:    Fear of current or ex partner: Not on file    Emotionally abused: Not on file    Physically abused: Not on file    Forced sexual activity: Not on file  Other Topics Concern   Not on file  Social History Narrative   Not on file     PHYSICAL EXAM  There were no vitals filed for this visit.  There is no height or weight on file to calculate BMI.   General: The patient is well-developed and well-nourished and in no acute distress  Eyes:   There is mild conjunctival erythema on the right..  Normal funduscopic examination.  Neck: The neck is tender over the right occiput and splenius capitis muscle.  Range of motion is fairly normal   Neurologic Exam  Mental status: The patient is alert and oriented x 3 at the time of the examination. The patient has apparent normal recent and remote memory, with an apparently normal attention span and concentration ability.   Speech is normal.  Cranial nerves: Extraocular movements are full.  She has very mild right ptosis and facial strength is normal elsewhere.  There is normal facial sensation to soft touch.   Trapezius and sternocleidomastoid strength is normal. No dysarthria is noted.  The tongue is midline, and the patient has symmetric elevation of the soft palate. No obvious hearing deficits are noted.  Motor:  Muscle bulk is normal.   Tone is normal. Strength is  5 / 5 in all 4 extremities.   Sensory: She has normal sensation to touch and vibration in the arms and legs   Gait and station: Station is normal.   Gait and the tandem gait are normal.  Romberg is negative.    DIAGNOSTIC DATA (LABS, IMAGING, TESTING) - I reviewed patient records, labs, notes, testing and imaging myself where available.  Lab Results  Component Value Date  WBC 8.4 03/14/2018   HGB 13.3 03/14/2018   HCT 40.0 03/14/2018   MCV 89.1 03/14/2018   PLT 67 (L) 03/14/2018      Component Value  Date/Time   NA 138 03/15/2018 0634   NA 138 08/16/2017 1627   NA 138 08/31/2016 0957   K 3.8 03/15/2018 0634   K 3.9 08/31/2016 0957   CL 107 03/15/2018 0634   CO2 21 (L) 03/15/2018 0634   CO2 22 08/31/2016 0957   GLUCOSE 123 (H) 03/15/2018 0634   GLUCOSE 106 08/31/2016 0957   BUN 13 03/15/2018 0634   BUN 8 08/16/2017 1627   BUN 8.6 08/31/2016 0957   CREATININE 0.77 03/15/2018 0634   CREATININE 0.8 08/31/2016 0957   CALCIUM 10.0 03/15/2018 0634   CALCIUM 10.3 08/31/2016 0957   PROT 7.5 03/14/2018 0316   PROT 7.2 08/16/2017 1627   PROT 7.6 08/31/2016 0957   ALBUMIN 4.0 03/14/2018 0316   ALBUMIN 4.6 08/16/2017 1627   ALBUMIN 3.9 08/31/2016 0957   AST 28 03/14/2018 0316   AST 18 08/31/2016 0957   ALT 26 03/14/2018 0316   ALT 23 08/31/2016 0957   ALKPHOS 53 03/14/2018 0316   ALKPHOS 62 08/31/2016 0957   BILITOT 0.5 03/14/2018 0316   BILITOT 0.3 08/16/2017 1627   BILITOT 0.43 08/31/2016 0957   GFRNONAA >60 03/15/2018 0634   GFRAA >60 03/15/2018 0634           ASSESSMENT AND PLAN  Vascular headache  Central retinal vein occlusion of right eye, unspecified complication status  Abnormal MRI of head  Numbness  Mood disorder ([place])  Ricke Kimoto A. Felecia Shelling, MD, PhD 9/61/1643, 5:39 AM Certified in Neurology, Clinical Neurophysiology, Sleep Medicine, Pain Medicine and Neuroimaging   [facility] [address], [place], [place] [address] [phone]

## 2018-06-27 NOTE — Telephone Encounter (Signed)
PA was approved by [facility] from 06/27/2018-07/27/2018. PA# [employer] [id number] [facility]  [facility] informed.

## 2018-06-27 NOTE — Telephone Encounter (Signed)
Received request for PA for tylenol #3. PA completed via covermymeds. Key: [id number]. Sent to [facility]. Should have a determination in 1-3 business days.  Of note, the goodrx price for 20 tablets is $8.74.

## 2018-06-27 NOTE — Telephone Encounter (Signed)
06/27/18 - LVM to schedule 4 month f/u with Dr. Felecia Shelling

## 2018-06-27 NOTE — Telephone Encounter (Signed)
-----   Message from Britt Bottom, MD sent at 06/27/2018  9:54 AM EDT ----- Follow-up in 4 months please

## 2018-07-05 ENCOUNTER — Ambulatory Visit (INDEPENDENT_AMBULATORY_CARE_PROVIDER_SITE_OTHER): Payer: Self-pay | Admitting: Psychiatry

## 2018-07-05 ENCOUNTER — Other Ambulatory Visit: Payer: Self-pay

## 2018-07-05 DIAGNOSIS — F410 Panic disorder [episodic paroxysmal anxiety] without agoraphobia: Secondary | ICD-10-CM

## 2018-07-05 DIAGNOSIS — F321 Major depressive disorder, single episode, moderate: Secondary | ICD-10-CM

## 2018-07-05 MED ORDER — HYDROXYZINE HCL 50 MG PO TABS: 50.0000 mg | ORAL_TABLET | Freq: Three times a day (TID) | ORAL | 0 refills | Status: DC | PRN

## 2018-07-05 MED ORDER — VORTIOXETINE HBR 10 MG PO TABS: 10.0000 mg | ORAL_TABLET | Freq: Every day | ORAL | 1 refills | Status: DC
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
## 2018-07-06 ENCOUNTER — Other Ambulatory Visit: Payer: Self-pay

## 2018-07-06 ENCOUNTER — Encounter (HOSPITAL_COMMUNITY): Payer: Self-pay

## 2018-07-06 ENCOUNTER — Emergency Department (HOSPITAL_COMMUNITY)
Admission: EM | Admit: 2018-07-06 | Discharge: 2018-07-07 | Disposition: A | Discharge status: Home / Self Care | Payer: PRIVATE HEALTH INSURANCE | Attending: Emergency Medicine | Admitting: Emergency Medicine

## 2018-07-06 ENCOUNTER — Emergency Department (HOSPITAL_COMMUNITY): Payer: PRIVATE HEALTH INSURANCE

## 2018-07-06 DIAGNOSIS — Z79899 Other long term (current) drug therapy: Secondary | ICD-10-CM | POA: Diagnosis not present

## 2018-07-06 DIAGNOSIS — R9089 Other abnormal findings on diagnostic imaging of central nervous system: Secondary | ICD-10-CM | POA: Insufficient documentation

## 2018-07-06 DIAGNOSIS — F17290 Nicotine dependence, other tobacco product, uncomplicated: Secondary | ICD-10-CM | POA: Diagnosis not present

## 2018-07-06 DIAGNOSIS — R569 Unspecified convulsions: Secondary | ICD-10-CM | POA: Diagnosis present

## 2018-07-06 LAB — CBC WITH DIFFERENTIAL/PLATELET
Abs Immature Granulocytes: 0.06 10*3/uL (ref 0.00–0.07)
Basophils Absolute: 0 10*3/uL (ref 0.0–0.1)
Basophils Relative: 0 %
Eosinophils Absolute: 0.1 10*3/uL (ref 0.0–0.5)
Eosinophils Relative: 1 %
HCT: 38.1 % (ref 36.0–46.0)
Hemoglobin: 12.9 g/dL (ref 12.0–15.0)
Immature Granulocytes: 1 %
Lymphocytes Relative: 15 %
Lymphs Abs: 2 10*3/uL (ref 0.7–4.0)
MCH: 29.6 pg (ref 26.0–34.0)
MCHC: 33.9 g/dL (ref 30.0–36.0)
MCV: 87.4 fL (ref 80.0–100.0)
Monocytes Absolute: 0.4 10*3/uL (ref 0.1–1.0)
Monocytes Relative: 3 %
Neutro Abs: 10.8 10*3/uL — ABNORMAL HIGH (ref 1.7–7.7)
Neutrophils Relative %: 80 %
Platelets: DECREASED 10*3/uL (ref 150–400)
RBC: 4.36 MIL/uL (ref 3.87–5.11)
RDW: 11.9 % (ref 11.5–15.5)
WBC: 13.3 10*3/uL — ABNORMAL HIGH (ref 4.0–10.5)
nRBC: 0 % (ref 0.0–0.2)

## 2018-07-06 LAB — RAPID URINE DRUG SCREEN, HOSP PERFORMED
Amphetamines: NOT DETECTED
Barbiturates: NOT DETECTED
Benzodiazepines: NOT DETECTED
Cocaine: NOT DETECTED
Opiates: NOT DETECTED
Tetrahydrocannabinol: NOT DETECTED

## 2018-07-06 LAB — URINALYSIS, ROUTINE W REFLEX MICROSCOPIC
Bilirubin Urine: NEGATIVE
Glucose, UA: NEGATIVE mg/dL
Hgb urine dipstick: NEGATIVE
Ketones, ur: NEGATIVE mg/dL
Leukocytes,Ua: NEGATIVE
Nitrite: NEGATIVE
Protein, ur: 100 mg/dL — AB
Specific Gravity, Urine: 1.019 (ref 1.005–1.030)
pH: 6 (ref 5.0–8.0)

## 2018-07-06 LAB — COMPREHENSIVE METABOLIC PANEL
ALT: 28 U/L (ref 0–44)
AST: 23 U/L (ref 15–41)
Albumin: 4 g/dL (ref 3.5–5.0)
Alkaline Phosphatase: 93 U/L (ref 38–126)
Anion gap: 11 (ref 5–15)
BUN: 10 mg/dL (ref 6–20)
CO2: 21 mmol/L — ABNORMAL LOW (ref 22–32)
Calcium: 9.7 mg/dL (ref 8.9–10.3)
Chloride: 108 mmol/L (ref 98–111)
Creatinine, Ser: 0.73 mg/dL (ref 0.44–1.00)
GFR calc Af Amer: >60 mL/min (ref >60)
GFR calc non Af Amer: >60 mL/min (ref >60)
Glucose, Bld: 113 mg/dL — ABNORMAL HIGH (ref 70–99)
Potassium: 3.7 mmol/L (ref 3.5–5.1)
Sodium: 140 mmol/L (ref 135–145)
Total Bilirubin: 0.6 mg/dL (ref 0.3–1.2)
Total Protein: 6.9 g/dL (ref 6.5–8.1)

## 2018-07-06 LAB — ETHANOL: Alcohol, Ethyl (B): <10 mg/dL (ref <10)

## 2018-07-06 IMAGING — MR MRI HEAD WITHOUT CONTRAST
10 of 12 series · 29 of 48 positions shown · non-contrast
Comparison: Previous brain MRIs from [DATE] as well as
[DATE] as well as earlier studies.

CLINICAL DATA: Initial evaluation for acute seizure.

EXAM:
MRI HEAD WITHOUT CONTRAST
TECHNIQUE: Multiplanar, multiecho pulse sequences of the brain and surrounding
structures were obtained without intravenous contrast.

[Series 4: DWI · axial · 3.0mm · 0.94mm/px · z∈[-88,+57]mm · 7 of 100 slices shown (1 of 2)]
[im 1/100]
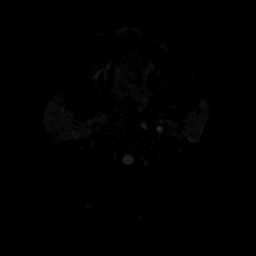
[im 17/100]
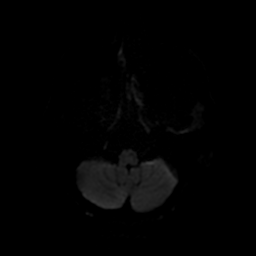
[im 34/100]
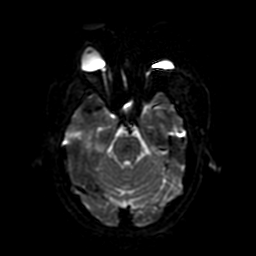
[im 50/100]
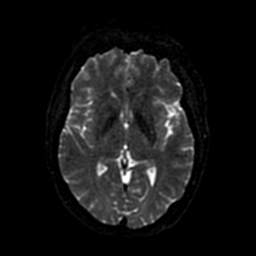
[im 67/100]
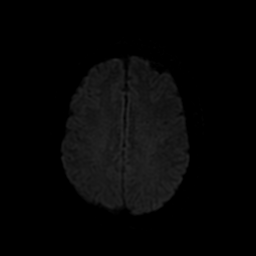
[im 83/100]
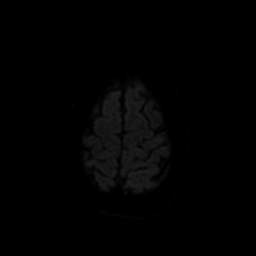
[im 100/100]
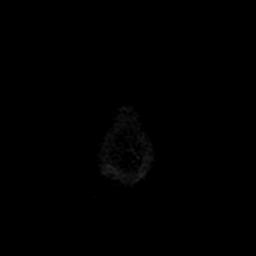

[Series 5: DWI · coronal · 4.0mm · 0.94mm/px · 6 of 72 slices shown (2 of 2)]
[im 1/72]
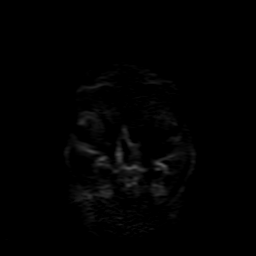
[im 15/72]
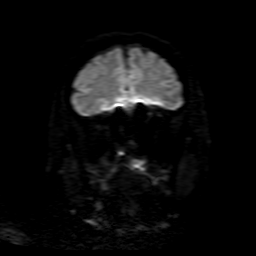
[im 29/72]
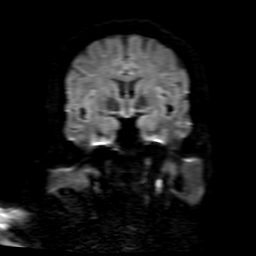
[im 43/72]
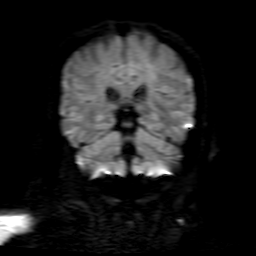
[im 57/72]
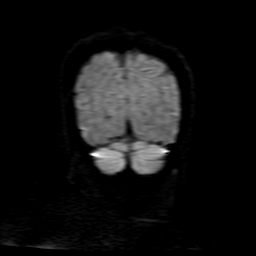
[im 72/72]
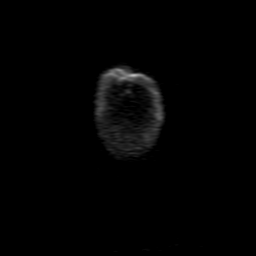

[Series 8: T2 · axial · 5.0mm · 0.45mm/px · z∈[-94,+48]mm · 2 of 25 slices shown (1 of 2)]
[im 1/25]
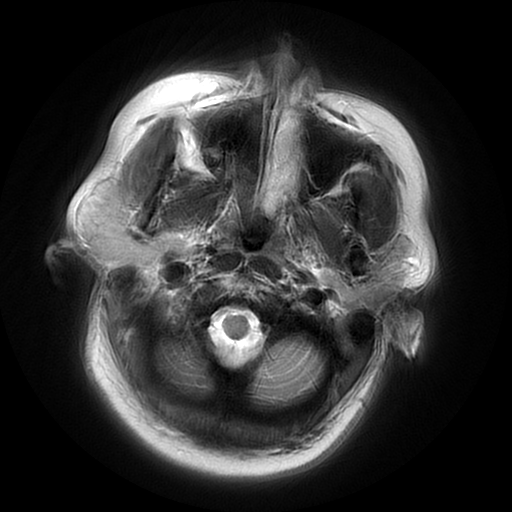
[im 25/25]
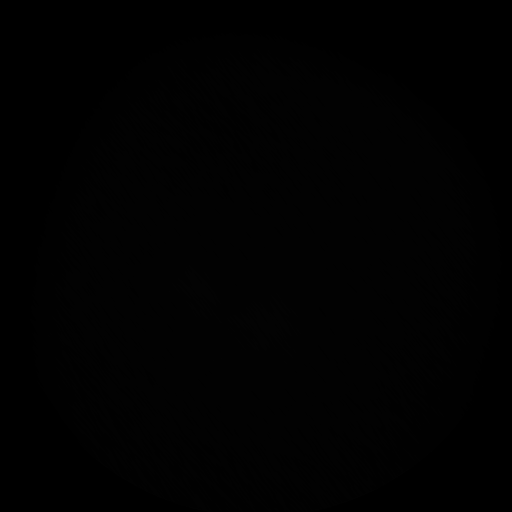

[Series 10: FLAIR · axial · 3.0mm · 0.45mm/px · z∈[-101,+40]mm · 2 of 25 slices shown (1 of 3)]
[im 1/25]
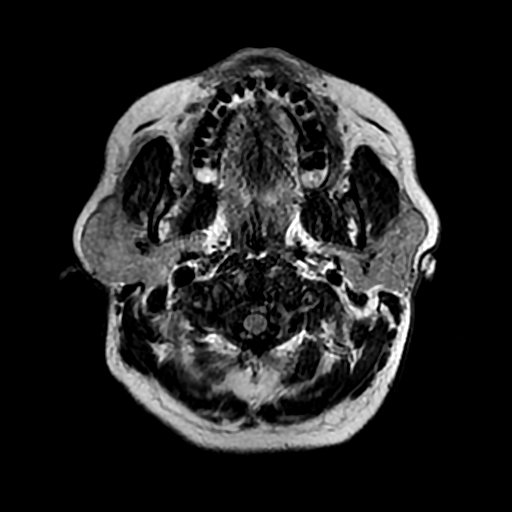
[im 25/25]
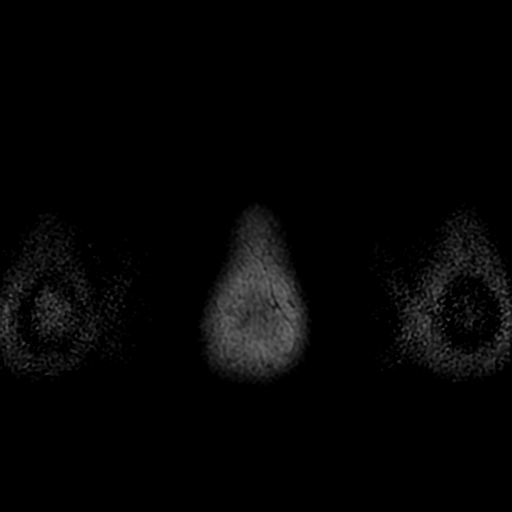

[Series 11: FLAIR · sagittal · 5.0mm · 0.47mm/px · 2 of 25 slices shown (2 of 3)]
[im 1/25]
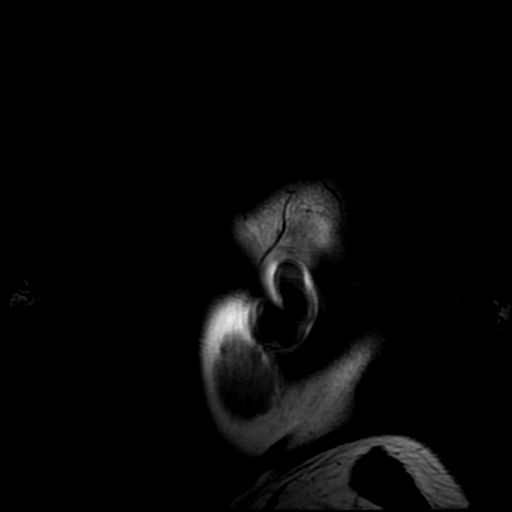
[im 25/25]
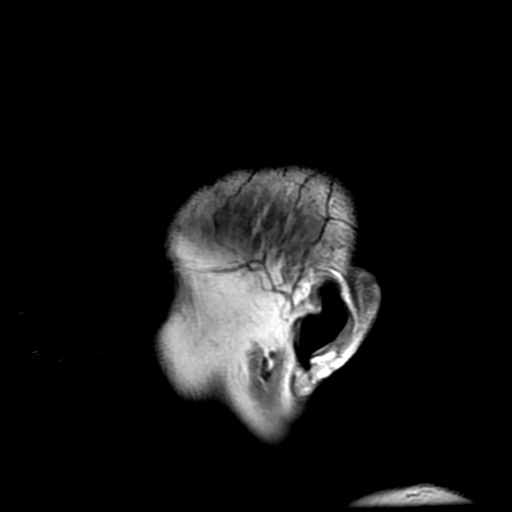

[Series 12: SWI · axial · 3.0mm · 0.47mm/px · 1 of 104 slices shown]
[im 1/104]
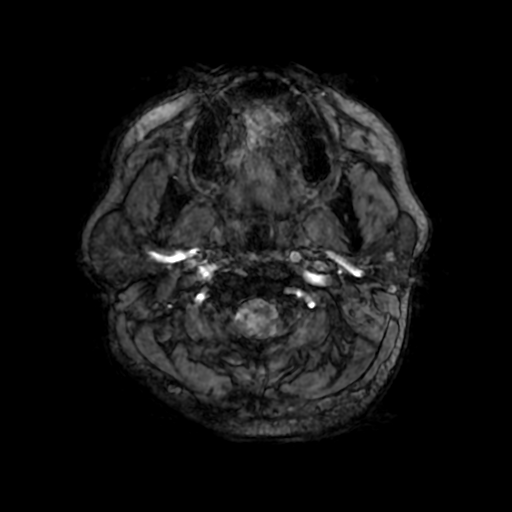

[Series 14: T2 · coronal · 3.0mm · 0.35mm/px · 2 of 26 slices shown (2 of 2)]
[im 1/26]
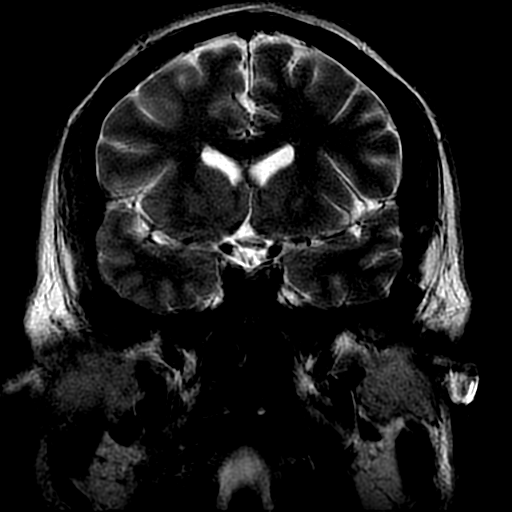
[im 26/26]
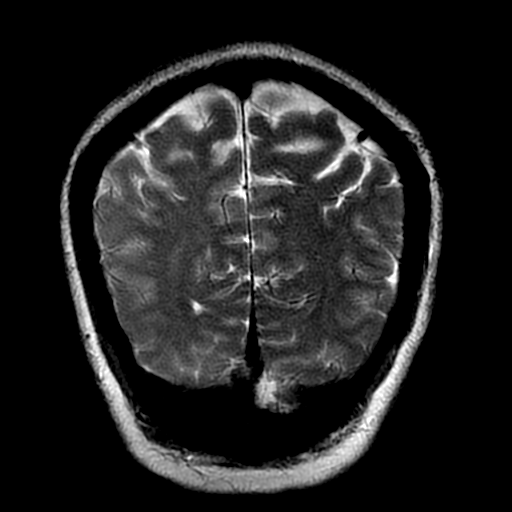

[Series 15: FLAIR · coronal · 3.0mm · 0.35mm/px · 2 of 26 slices shown (3 of 3)]
[im 1/26]
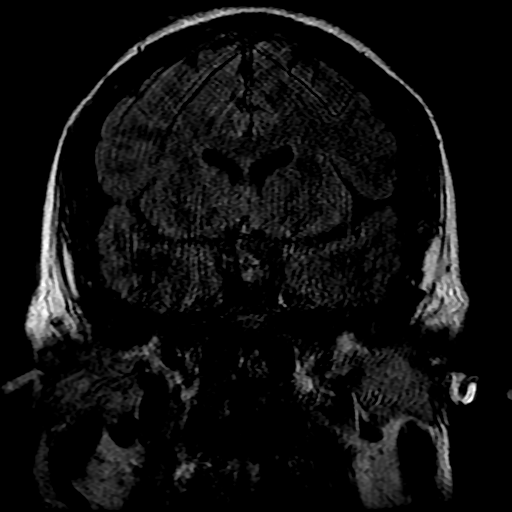
[im 26/26]
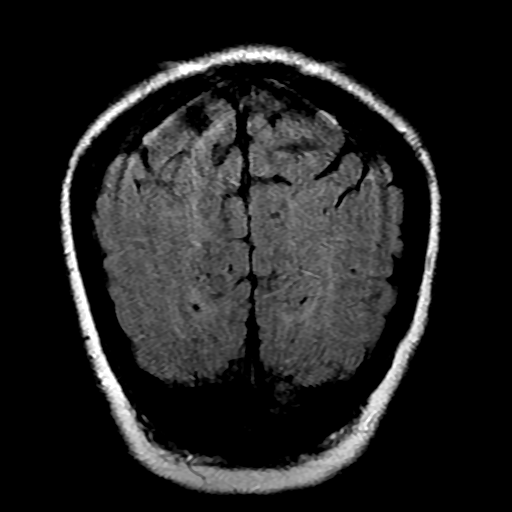

[Series 16: T2 post-contrast · coronal · 5.0mm · 0.39mm/px · 2 of 30 slices shown]
[im 1/30]
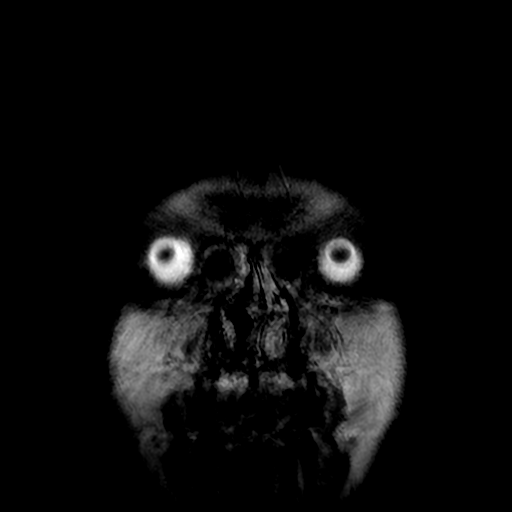
[im 30/30]
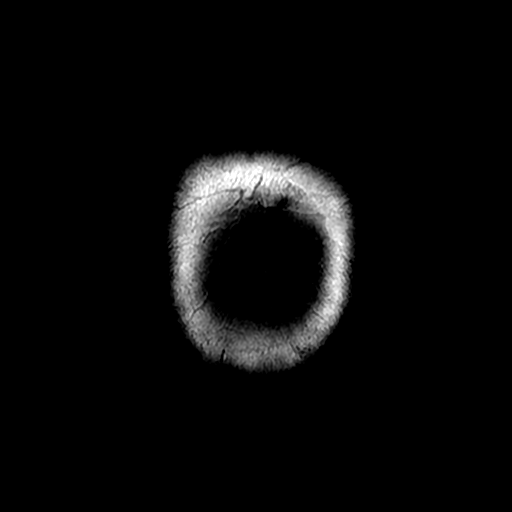

[Series 550: ADC · coronal · 4.0mm · 0.94mm/px · 3 of 36 slices shown]
[im 1/36]
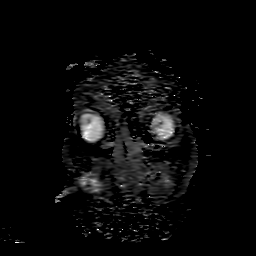
[im 18/36]
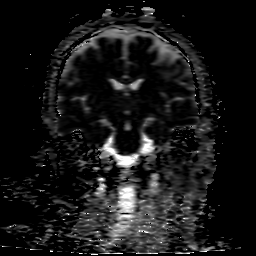
[im 36/36]
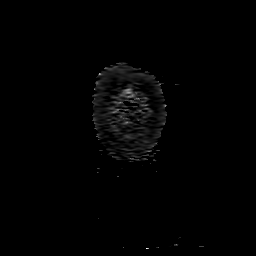

[29 of 48 positions shown; findings below may reference images not displayed]

FINDINGS: Brain: Examination mildly degraded by motion artifact.

Cerebral volume is stable, and remains within normal limits for age.
Again seen are 2 juxta cortical T2/FLAIR hyperintensities involving
the anterior left frontal lobe, also seen on previous exam.
Associated trace cortical involvement about the posterior lesion,
stable from previous. Since previous exam, the posterior lesion
appears increased in size measuring approximately 3 cm in size
(series 10, image 18). Additionally, the anterior lesion is also
slightly increased now measuring approximately 16 mm in size (series
10, image 19). Adjacent T1 black hole again noted (series 13, image
37). No significant associated diffusion abnormality. Trace chronic
hemosiderin staining about these lesions again noted, relatively
stable (series 12, image 76, 79). Previously seen third lesion
involving the parasagittal left frontal lobe has resolved, and is no
longer visualized. No significant residual encephalomalacia or other
abnormality at this location. No other new lesion seen elsewhere
within the brain.

Remainder of the brain is essentially normal in appearance. No other
signal abnormality. No other mass lesion, midline shift, or mass
effect. No hydrocephalus. No extra-axial fluid collection. No
intrinsic temporal lobe abnormality.

Pituitary gland suprasellar region normal. Midline structures intact
and normal.

Vascular: Major intracranial vascular flow voids are maintained.

Skull and upper cervical spine: Craniocervical junction within
normal limits. Upper cervical spine normal. No focal marrow
replacing lesion. Scalp soft tissues unremarkable.

Sinuses/Orbits: Globes and orbital soft tissues grossly within
normal limits on this motion degraded exam. Small right maxillary
sinus retention cyst noted. Paranasal sinuses are otherwise clear.
No mastoid effusion.

Other: None.
IMPRESSION: 1. Slight interval increase in size of previously identified waxing
and waning chronic juxta cortical FLAIR hyperintensities involving
the anterior left frontal lobe, with interval resolution of
previously seen left parasagittal left frontal lesion. No new
lesions identified. Again, these findings are of uncertain etiology
and are nonspecific. Primary differential considerations include
sequelae of demyelinating disease, an infectious or inflammatory
process, or changes related to vasculitis. Sequelae of ischemia not
entirely excluded, although felt to be less likely given the waxing
and waning nature of these findings. Similarly, tumor felt to be
unlikely given the multifocality of these findings and waxing and
waning nature as well.
2. Otherwise stable and normal MRI appearance of the brain.

## 2018-07-06 MED ORDER — LORAZEPAM 2 MG/ML IJ SOLN
1.0000 mg | INTRAMUSCULAR | Status: DC | PRN
  Administered 2018-07-06: 1 mg via INTRAVENOUS
  Filled 2018-07-06: qty 1

## 2018-07-06 MED ORDER — LEVETIRACETAM IN NACL 500 MG/100ML IV SOLN
500.0000 mg | Freq: Two times a day (BID) | INTRAVENOUS | Status: DC
  Administered 2018-07-06: 20:00:00 500 mg via INTRAVENOUS
  Filled 2018-07-06 (×2): qty 100

## 2018-07-06 MED ORDER — LEVETIRACETAM 500 MG PO TABS: 500.0000 mg | ORAL_TABLET | Freq: Two times a day (BID) | ORAL | 0 refills | Status: DC

## 2018-07-06 MED ORDER — SODIUM CHLORIDE 0.9 % IV BOLUS
500.0000 mL | Freq: Once | INTRAVENOUS | Status: AC
  Administered 2018-07-06: 500 mL via INTRAVENOUS

## 2018-07-06 NOTE — ED Provider Notes (Signed)
[facility] EMERGENCY DEPARTMENT Provider Note   CSN: [id number] Arrival date & time: 07/06/18  1606    History   Chief Complaint Chief Complaint  Patient presents with  . Seizures    HPI Angel Rodriguez is a 43 y.o. female.     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 Patient states that she feels sore, tired all over, without focal pain, without new focal weakness. She has previously been on in antiepileptic, but is not currently taking this, seemingly at the suggestion of her neurologist. With history of prior seizure, she has seen neurology several times, has had reported abnormal MRI findings, and was actually scheduled for additional evaluation at [facility], without appointment was canceled due to the ongoing pandemic. She was in her usual state of health today, until she had a period of no recollection, during which she was observed to have focal shaking, then generalized shaking followed by a.  Consistent with postictal phase. On my evaluation the patient is awake, alert, oriented appropriately, as above, has some generalized complaints, but no new focal weakness, no focal pain.  Past Medical History:  Diagnosis Date  . Anxiety   . 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 Cluster headaches    started after seizure and migraines  . Complication of anesthesia    trouble with short term memory after surgeries  . Depression   . Fibroids    s/p TLH, bilateral salpingectomy  . GERD (gastroesophageal reflux disease)    patient thinks due to medications  . History of anemia    prior to hysterectomy  . History of pneumonia   . History of trichomoniasis   . Seizures ([place]) x 1 2-3 yrs ago none since   once saw neurologist, full body brain MRI and another 6 months lster  . Status post right oophorectomy   . Thrombocytopenia ([place])    sees [place] with Heme Onc    Patient Active Problem List   Diagnosis Date Noted  . Major depressive disorder, single episode, moderate ([place]) 06/06/2018  . Panic disorder 06/06/2018  . Flank pain 05/27/2018  . Acute left-sided low back pain without sciatica 05/27/2018  . Mood disorder ([place]) 05/09/2018  . Episode of recurrent major depressive disorder ([place]) 05/09/2018  . Mental confusion/ slurred speech 05/09/2018  . High risk medications (not anticoagulants) long-term use 05/09/2018  . Numbness 03/18/2018  . Vitamin D deficiency 03/18/2018  . Thrombocytopenia ([place]) 03/14/2018  . Dysarthria 03/14/2018  . Brain lesion 03/14/2018  . Right sided weakness 03/13/2018  . 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 Abnormal MRI of head 01/21/2016  . Vascular headache 09/15/2015  . Insomnia 09/15/2015  . Headache 07/08/2015  . Headache, chronic migraine without aura, intractable 07/08/2015  . Anxiety 07/08/2015  . Neck pain 06/24/2015  . Seizure ([place]) 06/24/2015  . Central retinal vein occlusion 12/11/2011  . Cotton wool exudates 12/11/2011  . Cystoid macular edema 12/11/2011    Past Surgical History:  Procedure Laterality Date  . CYSTOSCOPY N/A 07/31/2017   Procedure: CYSTOSCOPY;  Surgeon: Megan Salon, MD;  Location: [facility];  Service: Gynecology;  Laterality: N/A;  . ENDOMETRIAL ABLATION    . EYE SURGERY     laser - removed blood from right eye  . HYSTEROSCOPY W/D&C    . LAPAROSCOPY N/A 09/19/2017   Procedure: LAPAROSCOPY DIAGNOSTIC WITH RIGHT  OOPHERECTOMY, LYSIS OF ADHESIONS;  Surgeon: Salvadore Dom, MD;  Location: [facility];  Service: Gynecology;  Laterality: N/A;  . OMENTECTOMY N/A 11/21/2017   Procedure: OMENTECTOMY AND PELVIC WASHINGS;  Surgeon: Isabel Caprice, MD;  Location: [facility];  Service: Gynecology;  Laterality: N/A;  . ROBOTIC ASSISTED SALPINGO OOPHERECTOMY Left 11/21/2017   Procedure: XI ROBOTIC ASSISTED LEFT SALPINGO OOPHORECTOMY;  Surgeon: Isabel Caprice, MD;  Location: [facility];  Service: Gynecology;  Laterality: Left;  . TOTAL LAPAROSCOPIC HYSTERECTOMY WITH SALPINGECTOMY Bilateral 07/31/2017   Procedure: TOTAL LAPAROSCOPIC HYSTERECTOMY WITH SALPINGECTOMY;  Surgeon: Megan Salon, MD;  Location: [facility];  Service: Gynecology;  Laterality: Bilateral;  20 week size uterus/ Alexis bag in room/ need 4.5 hours  . TUBAL LIGATION    . WISDOM TOOTH EXTRACTION       OB History    Gravida  5   Para  3   Term  3   Preterm  0   AB  2   Living  3     SAB  1   TAB      Ectopic  1   Multiple      Live Births               Home Medications    Prior to Admission medications   Medication Sig Start Date End Date Taking? Authorizing Provider  acetaminophen-codeine (TYLENOL #3) 300-30 MG tablet One to two prn headache.  No mor ethan 4/day 06/27/18   Sater, Nanine Means, MD  atropine 1 % ophthalmic solution Place 1 drop into the right eye 3 (three) times daily.  01/10/16   [provider]  brimonidine (ALPHAGAN P) 0.1 % SOLN Place 1 drop into the right eye 3 (three) times daily.  06/21/15   [provider]  calcium carbonate (TUMS EX) 750 MG chewable tablet Chew 2 tablets by mouth as needed for heartburn.    [provider]  Cyanocobalamin (VITAMIN B-12 PO) Take 1 tablet by mouth daily.     [provider]  dorzolamide-timolol (COSOPT) 22.3-6.8 MG/ML ophthalmic solution Place 1 drop into the right eye 2 (two) times daily.  06/21/15   [provider]  fluticasone (FLONASE) 50 MCG/ACT nasal spray Place 1-2 sprays into both nostrils daily as needed for allergies.     [provider]  hydrOXYzine (ATARAX/VISTARIL) 50 MG tablet Take 1 tablet (50 mg total) by mouth every 8 (eight) hours as needed for anxiety. 07/05/18 10/03/18  Pucilowski, Olgierd A, MD  latanoprost (XALATAN) 0.005 % ophthalmic solution Place 1 drop into both eyes at bedtime.    [provider]  levETIRAcetam (KEPPRA)  500 MG tablet Take 500 mg by mouth 2 (two) times daily.    [provider]  Multiple Vitamin (MULTI-VITAMINS) TABS Take 1 tablet by mouth daily.     [provider]  SUMAtriptan (IMITREX) 100 MG tablet Take 1 tablet (100 mg total) by mouth once as needed for migraine. May repeat in 2 hours if headache persists or recurs. 04/20/17   Sater, Nanine Means, MD  verapamil (CALAN-SR) 240 MG CR tablet Take 1 tablet (240 mg total) by mouth at bedtime. Patient taking differently: Take 240 mg by mouth at bedtime. For migraines 10/30/17   Sater, Nanine Means, MD  Vitamin D, Ergocalciferol, (DRISDOL) 1.25 MG (50000 UT) CAPS capsule Take 1 capsule (50,000 Units total) by mouth every 7 (seven) days. 03/21/18   Sater, Nanine Means, MD  vortioxetine HBr ([place])  10 MG TABS tablet Take 1 tablet (10 mg total) by mouth daily. 07/05/18 09/03/18  Pucilowski, Marchia Bond, MD    Family History Family History  Problem Relation Age of Onset  . Alcoholism Father   . Cirrhosis Father   . Alcohol abuse Father   . Cancer Maternal Grandmother        unsure of type; died of other causes  . Heart disease Maternal Aunt   . Drug abuse Cousin     Social History Social History   Tobacco Use  . Smoking status: Current Some Day Smoker    Years: 20.00    Types: E-cigarettes, Cigarettes  . Smokeless tobacco: Never Used  . Tobacco comment: occasional cigerate and vap  Substance Use Topics  . Alcohol use: Yes    Comment: occ wine  . Drug use: No     Allergies   Ivp dye [iodinated diagnostic agents] and Bee venom   Review of Systems Review of Systems  Constitutional:       Per HPI, otherwise negative  HENT:       Per HPI, otherwise negative  Respiratory:       Per HPI, otherwise negative  Cardiovascular:       Per HPI, otherwise negative  Gastrointestinal: Negative for vomiting.  Endocrine:       Negative aside from HPI  Genitourinary:       Neg aside from HPI   Musculoskeletal:       Per HPI,  otherwise negative  Skin: Negative.   Neurological: Positive for seizures and headaches. Negative for syncope.     Physical Exam Updated Vital Signs BP 129/73   Pulse 84   Temp 98.5 F (36.9 C) (Oral)   Resp 17   Ht 5' (1.524 m)   Wt 74.8 kg   LMP 05/02/2017   SpO2 99%   BMI 32.22 kg/m   Physical Exam Vitals signs and nursing note reviewed.  Constitutional:      General: She is not in acute distress.    Appearance: She is well-developed.  HENT:     Head: Normocephalic and atraumatic.  Eyes:     Conjunctiva/sclera: Conjunctivae normal.  Cardiovascular:     Rate and Rhythm: Normal rate and regular rhythm.  Pulmonary:     Effort: Pulmonary effort is normal. No respiratory distress.     Breath sounds: Normal breath sounds. No stridor.  Abdominal:     General: There is no distension.  Skin:    General: Skin is warm and dry.  Neurological:     General: No focal deficit present.     Mental Status: She is alert and oriented to person, place, and time.     Cranial Nerves: No cranial nerve deficit.  Psychiatric:     Comments: Withdrawn but awake, alert, oriented appropriately      ED Treatments / Results  Labs (all labs ordered are listed, but only abnormal results are displayed) Labs Reviewed  COMPREHENSIVE METABOLIC PANEL - Abnormal; Notable for the following components:      Result Value   CO2 21 (*)    Glucose, Bld 113 (*)    All other components within normal limits  CBC WITH DIFFERENTIAL/PLATELET - Abnormal; Notable for the following components:   WBC 13.3 (*)    Neutro Abs 10.8 (*)    All other components within normal limits  URINALYSIS, ROUTINE W REFLEX MICROSCOPIC - Abnormal; Notable for the following components:   APPearance HAZY (*)  Protein, ur 100 (*)    Bacteria, UA RARE (*)    All other components within normal limits  ETHANOL  RAPID URINE DRUG SCREEN, HOSP PERFORMED    EKG EKG Interpretation  Date/Time:  Saturday Jul 06 2018 16:16:38  EDT Ventricular Rate:  74 PR Interval:    QRS Duration: 84 QT Interval:  358 QTC Calculation: 398 R Axis:   38 Text Interpretation:  Sinus arrhythmia Borderline repolarization abnormality Otherwise within normal limits Confirmed by Carmin Muskrat [phone]) on 07/06/2018 6:04:59 PM   Radiology No results found.  Procedures Procedures (including critical care time)  Medications Ordered in ED Medications  levETIRAcetam (KEPPRA) IVPB 500 mg/100 mL premix (has no administration in time range)  sodium chloride 0.9 % bolus 500 mL (500 mLs Intravenous Bolus from Bag 07/06/18 1648)     Initial Impression / Assessment and Plan / ED Course  I have reviewed the triage vital signs and the nursing notes.  Pertinent labs & imaging results that were available during my care of the patient were reviewed by me and considered in my medical decision making (see chart for details).       After the initial evaluation I reviewed the patient's chart including documentation from neurology 1 week ago, with reference to MRI performed earlier this year.  Pertinent results as below: "In January she had neurologic symptoms including numbness and weakness in her right hand.  Details are in my visit notes from that day.  She feels she is better and she had complete resolution of the right-sided symptoms.  The MRI is unusual with 3 juxtacortical and subcortical enhancing lesions, 2 of which were present on MRI 02/14/2016 with 1 of them enhancing at that time.  An MRI 07/02/2015 showed the 2 previous lesions without enhancement.   She was supposed to get a second opinion in late March at [facility] but it was rescheduled due to Covid-19.  I encouraged her to follow through as I do not have a clear diagnosis for her symptoms and MRI changes.  We discussed that the numbness and clumsiness in the hands she experienced could be explained by the enhancing lesions but the etiology is unclear."  7:16 PM Patient in similar  condition, no additional seizure activity. I discussed her case with our neurology colleagues, 1 of whom is evaluated the patient, during her last hospitalization. With concern for abnormal MRI, this will be repeated, and the patient is having loading dose of Keppra.  11:29 PM Has received Keppra loading dose, and is awaiting MRI. With concern for recurrent seizures, she will be discharged with Keppra, if MRI is largely unchanged. If there are notable findings, patient will require additional evaluation per Dr. Roxanne Mins is aware of the patient and will dispo.  Final Clinical Impressions(s) / ED Diagnoses   Final diagnoses:  Seizure [facility])    ED Discharge Orders         Ordered    levETIRAcetam (KEPPRA) 500 MG tablet  2 times daily     07/06/18 2330           Carmin Muskrat, MD 07/06/18 2330

## 2018-07-06 NOTE — Discharge Instructions (Addendum)
Please be sure to schedule follow-up with your physicians at [facility]. Please take your medication as directed and follow-up with your local neurologist as well. Return here for any concerning changes in your condition.

## 2018-07-06 NOTE — ED Triage Notes (Signed)
Pt in the ED for having had her third seizure. (she had her first seizure years ago, & then her second was this past January, per pt) She took [place] for a short time after the one she had in January & states she does not take anything daily for them.

## 2018-07-07 NOTE — ED Provider Notes (Signed)
Care assumed from Dr. Vanita Panda, patient with seizures and abnormal MRI scan.  She has received a loading dose of levetiracetam.  MRI obtained is essentially unchanged from prior.  No indication for hospitalization, but she will need to make sure to follow-up with her neurologist at [facility].  Results for orders placed or performed during the hospital encounter of 07/06/18  Comprehensive metabolic panel  Result Value Ref Range   Sodium 140 135 - 145 mmol/L   Potassium 3.7 3.5 - 5.1 mmol/L   Chloride 108 98 - 111 mmol/L   CO2 21 (L) 22 - 32 mmol/L   Glucose, Bld 113 (H) 70 - 99 mg/dL   BUN 10 6 - 20 mg/dL   Creatinine, Ser 0.73 0.44 - 1.00 mg/dL   Calcium 9.7 8.9 - 10.3 mg/dL   Total Protein 6.9 6.5 - 8.1 g/dL   Albumin 4.0 3.5 - 5.0 g/dL   AST 23 15 - 41 U/L   ALT 28 0 - 44 U/L   Alkaline Phosphatase 93 38 - 126 U/L   Total Bilirubin 0.6 0.3 - 1.2 mg/dL   GFR calc non Af Amer >60 >60 mL/min   GFR calc Af Amer >60 >60 mL/min   Anion gap 11 5 - 15  CBC with Differential  Result Value Ref Range   WBC 13.3 (H) 4.0 - 10.5 K/uL   RBC 4.36 3.87 - 5.11 MIL/uL   Hemoglobin 12.9 12.0 - 15.0 g/dL   HCT 38.1 36.0 - 46.0 %   MCV 87.4 80.0 - 100.0 fL   MCH 29.6 26.0 - 34.0 pg   MCHC 33.9 30.0 - 36.0 g/dL   RDW 11.9 11.5 - 15.5 %   Platelets  150 - 400 K/uL    PLATELET CLUMPS NOTED ON SMEAR, COUNT APPEARS DECREASED   nRBC 0.0 0.0 - 0.2 %   Neutrophils Relative % 80 %   Neutro Abs 10.8 (H) 1.7 - 7.7 K/uL   Lymphocytes Relative 15 %   Lymphs Abs 2.0 0.7 - 4.0 K/uL   Monocytes Relative 3 %   Monocytes Absolute 0.4 0.1 - 1.0 K/uL   Eosinophils Relative 1 %   Eosinophils Absolute 0.1 0.0 - 0.5 K/uL   Basophils Relative 0 %   Basophils Absolute 0.0 0.0 - 0.1 K/uL   Immature Granulocytes 1 %   Abs Immature Granulocytes 0.06 0.00 - 0.07 K/uL  Ethanol  Result Value Ref Range   Alcohol, Ethyl (B) <10 <10 mg/dL  Urinalysis, Routine w reflex microscopic  Result Value Ref Range   Color,  Urine YELLOW YELLOW   APPearance HAZY (A) CLEAR   Specific Gravity, Urine 1.019 1.005 - 1.030   pH 6.0 5.0 - 8.0   Glucose, UA NEGATIVE NEGATIVE mg/dL   Hgb urine dipstick NEGATIVE NEGATIVE   Bilirubin Urine NEGATIVE NEGATIVE   Ketones, ur NEGATIVE NEGATIVE mg/dL   Protein, ur 100 (A) NEGATIVE mg/dL   Nitrite NEGATIVE NEGATIVE   Leukocytes,Ua NEGATIVE NEGATIVE   RBC / HPF 0-5 0 - 5 RBC/hpf   WBC, UA 0-5 0 - 5 WBC/hpf   Bacteria, UA RARE (A) NONE SEEN   Squamous Epithelial / LPF 0-5 0 - 5   Mucus PRESENT    Hyaline Casts, UA PRESENT   Urine rapid drug screen (hosp performed)  Result Value Ref Range   Opiates NONE DETECTED NONE DETECTED   Cocaine NONE DETECTED NONE DETECTED   Benzodiazepines NONE DETECTED NONE DETECTED   Amphetamines NONE DETECTED NONE DETECTED  Tetrahydrocannabinol NONE DETECTED NONE DETECTED   Barbiturates NONE DETECTED NONE DETECTED   Mr Brain Wo Contrast  Result Date: 07/07/2018 CLINICAL DATA:  Initial evaluation for acute seizure. EXAM: MRI HEAD WITHOUT CONTRAST TECHNIQUE: Multiplanar, multiecho pulse sequences of the brain and surrounding structures were obtained without intravenous contrast. COMPARISON:  Previous brain MRIs from 03/13/2018 as well as 03/14/2018 as well as earlier studies. FINDINGS: Brain: Examination mildly degraded by motion artifact. Cerebral volume is stable, and remains within normal limits for age. Again seen are 2 juxta cortical T2/FLAIR hyperintensities involving the anterior left frontal lobe, also seen on previous exam. Associated trace cortical involvement about the posterior lesion, stable from previous. Since previous exam, the posterior lesion appears increased in size measuring approximately 3 cm in size (series 10, image 18). Additionally, the anterior lesion is also slightly increased now measuring approximately 16 mm in size (series 10, image 19). Adjacent T1 black hole again noted (series 13, image 37). No significant associated  diffusion abnormality. Trace chronic hemosiderin staining about these lesions again noted, relatively stable (series 12, image 76, 79). Previously seen third lesion involving the parasagittal left frontal lobe has resolved, and is no longer visualized. No significant residual encephalomalacia or other abnormality at this location. No other new lesion seen elsewhere within the brain. Remainder of the brain is essentially normal in appearance. No other signal abnormality. No other mass lesion, midline shift, or mass effect. No hydrocephalus. No extra-axial fluid collection. No intrinsic temporal lobe abnormality. Pituitary gland suprasellar region normal. Midline structures intact and normal. Vascular: Major intracranial vascular flow voids are maintained. Skull and upper cervical spine: Craniocervical junction within normal limits. Upper cervical spine normal. No focal marrow replacing lesion. Scalp soft tissues unremarkable. Sinuses/Orbits: Globes and orbital soft tissues grossly within normal limits on this motion degraded exam. Small right maxillary sinus retention cyst noted. Paranasal sinuses are otherwise clear. No mastoid effusion. Other: None. IMPRESSION: 1. Slight interval increase in size of previously identified waxing and waning chronic juxta cortical FLAIR hyperintensities involving the anterior left frontal lobe, with interval resolution of previously seen left parasagittal left frontal lesion. No new lesions identified. Again, these findings are of uncertain etiology and are nonspecific. Primary differential considerations include sequelae of demyelinating disease, an infectious or inflammatory process, or changes related to vasculitis. Sequelae of ischemia not entirely excluded, although felt to be less likely given the waxing and waning nature of these findings. Similarly, tumor felt to be unlikely given the multifocality of these findings and waxing and waning nature as well. 2. Otherwise stable and  normal MRI appearance of the brain. Electronically Signed   By: Jeannine Boga M.D.   On: 07/07/2018 01:42   Images viewed by me.   Delora Fuel, MD 70/17/79 [phone]

## 2018-07-10 ENCOUNTER — Emergency Department (HOSPITAL_COMMUNITY)
Admission: EM | Admit: 2018-07-10 | Discharge: 2018-07-10 | Disposition: A | Discharge status: Home / Self Care | Payer: PRIVATE HEALTH INSURANCE | Attending: Emergency Medicine | Admitting: Emergency Medicine

## 2018-07-10 ENCOUNTER — Encounter (HOSPITAL_COMMUNITY): Payer: Self-pay | Admitting: Emergency Medicine

## 2018-07-10 DIAGNOSIS — R569 Unspecified convulsions: Secondary | ICD-10-CM | POA: Diagnosis present

## 2018-07-10 DIAGNOSIS — Z720 Tobacco use: Secondary | ICD-10-CM | POA: Diagnosis not present

## 2018-07-10 DIAGNOSIS — G40909 Epilepsy, unspecified, not intractable, without status epilepticus: Secondary | ICD-10-CM

## 2018-07-10 LAB — COMPREHENSIVE METABOLIC PANEL
ALT: 30 U/L (ref 0–44)
AST: 28 U/L (ref 15–41)
Albumin: 4.2 g/dL (ref 3.5–5.0)
Alkaline Phosphatase: 91 U/L (ref 38–126)
Anion gap: 10 (ref 5–15)
BUN: 9 mg/dL (ref 6–20)
CO2: 21 mmol/L — ABNORMAL LOW (ref 22–32)
Calcium: 10.5 mg/dL — ABNORMAL HIGH (ref 8.9–10.3)
Chloride: 111 mmol/L (ref 98–111)
Creatinine, Ser: 0.82 mg/dL (ref 0.44–1.00)
GFR calc Af Amer: >60 mL/min (ref >60)
GFR calc non Af Amer: >60 mL/min (ref >60)
Glucose, Bld: 148 mg/dL — ABNORMAL HIGH (ref 70–99)
Potassium: 4 mmol/L (ref 3.5–5.1)
Sodium: 142 mmol/L (ref 135–145)
Total Bilirubin: 0.7 mg/dL (ref 0.3–1.2)
Total Protein: 7.6 g/dL (ref 6.5–8.1)

## 2018-07-10 LAB — URINALYSIS, ROUTINE W REFLEX MICROSCOPIC
Bilirubin Urine: NEGATIVE
Glucose, UA: NEGATIVE mg/dL
Hgb urine dipstick: NEGATIVE
Ketones, ur: NEGATIVE mg/dL
Leukocytes,Ua: NEGATIVE
Nitrite: NEGATIVE
Protein, ur: NEGATIVE mg/dL
Specific Gravity, Urine: 1.006 (ref 1.005–1.030)
pH: 7 (ref 5.0–8.0)

## 2018-07-10 LAB — CBC
HCT: 42.8 % (ref 36.0–46.0)
Hemoglobin: 14.4 g/dL (ref 12.0–15.0)
MCH: 29.6 pg (ref 26.0–34.0)
MCHC: 33.6 g/dL (ref 30.0–36.0)
MCV: 87.9 fL (ref 80.0–100.0)
Platelets: 59 10*3/uL — ABNORMAL LOW (ref 150–400)
RBC: 4.87 MIL/uL (ref 3.87–5.11)
RDW: 12.1 % (ref 11.5–15.5)
WBC: 8.8 10*3/uL (ref 4.0–10.5)
nRBC: 0 % (ref 0.0–0.2)

## 2018-07-10 LAB — LACTIC ACID, PLASMA: Lactic Acid, Venous: 3.6 mmol/L (ref 0.5–1.9)

## 2018-07-10 LAB — CBG MONITORING, ED: Glucose-Capillary: 139 mg/dL — ABNORMAL HIGH (ref 70–99)

## 2018-07-10 MED ORDER — SODIUM CHLORIDE 0.9 % IV BOLUS
1000.0000 mL | Freq: Once | INTRAVENOUS | Status: AC
  Administered 2018-07-10: 10:00:00 1000 mL via INTRAVENOUS

## 2018-07-10 MED ORDER — LORAZEPAM 1 MG PO TABS
1.0000 mg | ORAL_TABLET | Freq: Once | ORAL | Status: AC
  Administered 2018-07-10: 1 mg via ORAL
  Filled 2018-07-10: qty 1

## 2018-07-10 MED ORDER — LEVETIRACETAM IN NACL 1500 MG/100ML IV SOLN
1500.0000 mg | Freq: Once | INTRAVENOUS | Status: AC
  Administered 2018-07-10: 1500 mg via INTRAVENOUS
  Filled 2018-07-10: qty 100

## 2018-07-10 NOTE — ED Provider Notes (Signed)
[facility] Emergency Department Provider Note MRN:  [id number]  Arrival date & time: 07/12/18     Chief Complaint   Seizures   History of Present Illness   Angel Rodriguez is a 43 y.o. year-old female with a history of seizures presenting to the ED with chief complaint of seizure.  Patient has been diagnosed with abnormal MRI findings/brain lesions.  This seems to be causing more recent seizure activity.  Was seen in the emergency department 4 days ago, received a repeat MRI, was discharged with a prescription for Keppra and was advised to follow-up with her [place] neurologist.  Patient admits to not taking any Keppra since her ED visit 4 days ago.  Think she had a another seizure today.  Began with right eye twitching, abnormal sensation of the right arm described best as a paresthesia, then she is told that she became unresponsive and exhibited seizure activity.  Endorsing severe fatigue upon awakening from this event.  Denies recent fever, no cough, no chest pain or shortness of breath, no abdominal pain, currently without numbness or weakness to the arms or legs.  Review of Systems  A complete 10 system review of systems was obtained and all systems are negative except as noted in the HPI and PMH.   Patient's Health History    Past Medical History:  Diagnosis Date  . Anxiety   . 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 Cluster headaches    started after seizure and migraines  . Complication of anesthesia    trouble with short term memory after surgeries  . Depression   . Fibroids    s/p TLH, bilateral salpingectomy  . GERD (gastroesophageal reflux disease)    patient thinks due to medications  . History of anemia    prior to hysterectomy  . History of pneumonia   . History of trichomoniasis   . Seizures ([place]) x 1 2-3 yrs ago none since   once saw neurologist, full body brain MRI and another 6 months lster  . Status post right oophorectomy   .  Thrombocytopenia ([place])    sees [place] with Heme Onc    Past Surgical History:  Procedure Laterality Date  . CYSTOSCOPY N/A 07/31/2017   Procedure: CYSTOSCOPY;  Surgeon: Megan Salon, MD;  Location: [facility];  Service: Gynecology;  Laterality: N/A;  . ENDOMETRIAL ABLATION    . EYE SURGERY     laser - removed blood from right eye  . HYSTEROSCOPY W/D&C    . LAPAROSCOPY N/A 09/19/2017   Procedure: LAPAROSCOPY DIAGNOSTIC WITH RIGHT OOPHERECTOMY, LYSIS OF ADHESIONS;  Surgeon: Salvadore Dom, MD;  Location: [facility];  Service: Gynecology;  Laterality: N/A;  . OMENTECTOMY N/A 11/21/2017   Procedure: OMENTECTOMY AND PELVIC WASHINGS;  Surgeon: Isabel Caprice, MD;  Location: [facility];  Service: Gynecology;  Laterality: N/A;  . ROBOTIC ASSISTED SALPINGO OOPHERECTOMY Left 11/21/2017   Procedure: XI ROBOTIC ASSISTED LEFT SALPINGO OOPHORECTOMY;  Surgeon: Isabel Caprice, MD;  Location: [facility];  Service: Gynecology;  Laterality: Left;  . TOTAL LAPAROSCOPIC HYSTERECTOMY WITH SALPINGECTOMY Bilateral 07/31/2017   Procedure: TOTAL LAPAROSCOPIC HYSTERECTOMY WITH SALPINGECTOMY;  Surgeon: Megan Salon, MD;  Location: [facility];  Service: Gynecology;  Laterality: Bilateral;  20 week size uterus/ Alexis bag in room/ need 4.5 hours  . TUBAL LIGATION    . WISDOM TOOTH EXTRACTION      Family History  Problem Relation Age of Onset  . Alcoholism  Father   . Cirrhosis Father   . Alcohol abuse Father   . Cancer Maternal Grandmother        unsure of type; died of other causes  . Heart disease Maternal Aunt   . Drug abuse Cousin     Social History   Socioeconomic History  . Marital status: Single    Spouse name: Not on file  . Number of children: Not on file  . Years of education: Not on file  . Highest education level: Not on file  Occupational History  . Not on file  Social Needs  . Financial resource strain: Not on file  . Food insecurity:    Worry: Not on file    Inability: Not on file  . Transportation  needs:    Medical: Not on file    Non-medical: Not on file  Tobacco Use  . Smoking status: Current Some Day Smoker    Years: 20.00    Types: E-cigarettes, Cigarettes  . Smokeless tobacco: Never Used  . Tobacco comment: occasional cigerate and vap  Substance and Sexual Activity  . Alcohol use: Yes    Comment: occ wine  . Drug use: No  . Sexual activity: Yes    Birth control/protection: Surgical    Comment: hysterectomy  Lifestyle  . Physical activity:    Days per week: Not on file    Minutes per session: Not on file  . Stress: Not on file  Relationships  . Social connections:    Talks on phone: Not on file    Gets together: Not on file    Attends religious service: Not on file    Active member of club or organization: Not on file    Attends meetings of clubs or organizations: Not on file    Relationship status: Not on file  . Intimate partner violence:    Fear of current or ex partner: Not on file    Emotionally abused: Not on file    Physically abused: Not on file    Forced sexual activity: Not on file  Other Topics Concern  . Not on file  Social History Narrative  . Not on file     Physical Exam  Vital Signs and Nursing Notes reviewed Vitals:   07/10/18 1345 07/10/18 1357  BP:  103/67  Pulse: 61 67  Resp:  16  Temp:    SpO2: 97% 97%    CONSTITUTIONAL: Well-appearing, NAD NEURO:  Alert and oriented x 3, normal and symmetric strength and sensation, normal coordination, no meningismus, no dysarthria, no aphasia, no visual field cuts, no neglect EYES:  eyes equal and reactive ENT/NECK:  no LAD, no JVD CARDIO: Regular rate, well-perfused, normal S1 and S2 PULM:  CTAB no wheezing or rhonchi GI/GU:  normal bowel sounds, non-distended, non-tender MSK/SPINE:  No gross deformities, no edema SKIN:  no rash, atraumatic PSYCH:  Appropriate speech and behavior  Diagnostic and Interventional Summary    EKG Interpretation  Date/Time:  Wednesday Jul 10 2018 09:05:27  EDT Ventricular Rate:  66 PR Interval:    QRS Duration: 83 QT Interval:  385 QTC Calculation: 404 R Axis:   42 Text Interpretation:  Sinus rhythm Borderline repolarization abnormality Confirmed by Gerlene Fee [phone]) on 07/10/2018 10:38:06 AM      Labs Reviewed  CBC - Abnormal; Notable for the following components:      Result Value   Platelets 59 (*)    All other components within normal limits  COMPREHENSIVE METABOLIC  PANEL - Abnormal; Notable for the following components:   CO2 21 (*)    Glucose, Bld 148 (*)    Calcium 10.5 (*)    All other components within normal limits  URINALYSIS, ROUTINE W REFLEX MICROSCOPIC - Abnormal; Notable for the following components:   Color, Urine STRAW (*)    All other components within normal limits  LACTIC ACID, PLASMA - Abnormal; Notable for the following components:   Lactic Acid, Venous 3.6 (*)    All other components within normal limits  CBG MONITORING, ED - Abnormal; Notable for the following components:   Glucose-Capillary 139 (*)    All other components within normal limits    No orders to display    Medications  sodium chloride 0.9 % bolus 1,000 mL (0 mLs Intravenous Stopped 07/10/18 1147)  LORazepam (ATIVAN) tablet 1 mg (1 mg Oral Given 07/10/18 0932)  levETIRAcetam (KEPPRA) IVPB 1500 mg/ 100 mL premix (0 mg Intravenous Stopped 07/10/18 1027)     Procedures Critical Care  ED Course and Medical Decision Making  I have reviewed the triage vital signs and the nursing notes.  Pertinent labs & imaging results that were available during my care of the patient were reviewed by me and considered in my medical decision making (see below for details).  Patient has a underlying undiagnosed CNS disorder according to her recent MRI, normal vital signs, reassuring neurological exam at this time, consistent with seizure today, likely related to medication noncompliance.  Will obtain screening labs and urinalysis to exclude secondary cause.   Care handed off to PA provider.  Anticipating normal labs, normal urinalysis, discharge after observation for any further seizure activity, education on the importance of antiepileptics.  Patient has already called her [facility] neurologist and moved up her follow-up appointment.    Barth Kirks. Sedonia Small, MD [place] [email or web address]  Final Clinical Impressions(s) / ED Diagnoses     ICD-10-CM   1. Seizure disorder [facility]) G40.909     ED Discharge Orders    None         Maudie Flakes, MD 07/12/18 1113

## 2018-07-10 NOTE — ED Notes (Signed)
Patient verbalizes understanding of discharge instructions. Opportunity for questioning and answers were provided. Armband removed by staff, pt discharged from ED. Pt wheeled to lobby. 

## 2018-07-10 NOTE — ED Notes (Signed)
Pt confirmed her mother will be coming to get her.

## 2018-07-10 NOTE — ED Provider Notes (Addendum)
Patient transferred to CDU, care assumed from Dr. Sedonia Small.  See his note for full HPI and work-up.  Briefly, patient with recent history of seizures and diagnosis of unknown brain lesions.  She has an upcoming appointment with [facility] for further evaluation.  Has been seen by [facility] neurology outpatient.  She presents today after a seizure due to noncompliance with Keppra.  She has been loaded with a dose of Keppra today.  Plan is to f/u urine results and likely discharge with instructions to take meds and outpt f/u. Physical Exam  BP 101/66   Pulse 68   Temp 98.8 F (37.1 C) (Oral)   Resp 19   LMP 05/02/2017   SpO2 96%   Physical Exam Vitals signs and nursing note reviewed.  Constitutional:      General: She is not in acute distress.    Appearance: She is well-developed.  HENT:     Head: Normocephalic and atraumatic.  Eyes:     Conjunctiva/sclera: Conjunctivae normal.  Cardiovascular:     Rate and Rhythm: Normal rate.  Pulmonary:     Effort: Pulmonary effort is normal.  Abdominal:     Palpations: Abdomen is soft.  Skin:    General: Skin is warm.  Neurological:     Mental Status: She is alert.  Psychiatric:        Behavior: Behavior normal.    Results for orders placed or performed during the hospital encounter of 07/10/18  CBC  Result Value Ref Range   WBC 8.8 4.0 - 10.5 K/uL   RBC 4.87 3.87 - 5.11 MIL/uL   Hemoglobin 14.4 12.0 - 15.0 g/dL   HCT 42.8 36.0 - 46.0 %   MCV 87.9 80.0 - 100.0 fL   MCH 29.6 26.0 - 34.0 pg   MCHC 33.6 30.0 - 36.0 g/dL   RDW 12.1 11.5 - 15.5 %   Platelets 59 (L) 150 - 400 K/uL   nRBC 0.0 0.0 - 0.2 %  Comprehensive metabolic panel  Result Value Ref Range   Sodium 142 135 - 145 mmol/L   Potassium 4.0 3.5 - 5.1 mmol/L   Chloride 111 98 - 111 mmol/L   CO2 21 (L) 22 - 32 mmol/L   Glucose, Bld 148 (H) 70 - 99 mg/dL   BUN 9 6 - 20 mg/dL   Creatinine, Ser 0.82 0.44 - 1.00 mg/dL   Calcium 10.5 (H) 8.9 - 10.3 mg/dL   Total Protein 7.6 6.5 - 8.1  g/dL   Albumin 4.2 3.5 - 5.0 g/dL   AST 28 15 - 41 U/L   ALT 30 0 - 44 U/L   Alkaline Phosphatase 91 38 - 126 U/L   Total Bilirubin 0.7 0.3 - 1.2 mg/dL   GFR calc non Af Amer >60 >60 mL/min   GFR calc Af Amer >60 >60 mL/min   Anion gap 10 5 - 15  Urinalysis, Routine w reflex microscopic  Result Value Ref Range   Color, Urine STRAW (A) YELLOW   APPearance CLEAR CLEAR   Specific Gravity, Urine 1.006 1.005 - 1.030   pH 7.0 5.0 - 8.0   Glucose, UA NEGATIVE NEGATIVE mg/dL   Hgb urine dipstick NEGATIVE NEGATIVE   Bilirubin Urine NEGATIVE NEGATIVE   Ketones, ur NEGATIVE NEGATIVE mg/dL   Protein, ur NEGATIVE NEGATIVE mg/dL   Nitrite NEGATIVE NEGATIVE   Leukocytes,Ua NEGATIVE NEGATIVE  Lactic acid, plasma  Result Value Ref Range   Lactic Acid, Venous 3.6 (HH) 0.5 - 1.9 mmol/L  CBG  monitoring, ED  Result Value Ref Range   Glucose-Capillary 139 (H) 70 - 99 mg/dL    ED Course/Procedures     Procedures  MDM  Patient with seizure after noncompliance with Keppra.  Currently being worked up in outpatient setting for multiple brain lesions, has a future coming appointment with [facility].  Labs are unremarkable today.  Patient given Keppra loading dose in the ED.  She states she does have the prescription for Keppra at home from recent ED visit, and will pick it up after leaving today.  Driving restrictions discussed.  Patient is well-appearing and safe for discharge at this time.  Discussed results, findings, treatment and follow up. Patient advised of return precautions. Patient verbalized understanding and agreed with plan.        Lasheika Ortloff, [place] N, PA-C 07/10/18 1355    Reese Senk, [place] N, [place] 07/10/18 1355    Maudie Flakes, MD 07/12/18 1116

## 2018-07-10 NOTE — ED Notes (Signed)
ED Provider at bedside. 

## 2018-07-10 NOTE — ED Triage Notes (Signed)
Pt here from home with c/o seizure was here this past sat for the same thing , unsure if she took her keppra today

## 2018-07-10 NOTE — Discharge Instructions (Addendum)
It is important that you take the Keppra as prescribed. Attend your appointment with the neurologist for further evaluation. It is important that you do not drive or operate any machinery until you have been cleared by a neurologist to do so.

## 2018-07-31 ENCOUNTER — Telehealth (HOSPITAL_COMMUNITY): Payer: Self-pay

## 2018-07-31 NOTE — Telephone Encounter (Signed)
Yes she can try Trintellix 10 mg 2 tablet or you can call Trintellix 20 mg daily.

## 2018-07-31 NOTE — Telephone Encounter (Signed)
This is a patient of Dr. Montel Culver. Patient called regarding her Trintellix 10mg . She stated that she feels like it is not working anymore and inquired as to whether the dosage could be increased. She has an upcoming appointment on 08/06/18. Please review and advise. Thank you.

## 2018-08-05 NOTE — Telephone Encounter (Signed)
Spoke with patient. She has her Trintellix 10mg  left so she is going to try taking it BID to see how that works as recommended. She has a lot to talk with the doctor about so I reminded her of her appointment 08/06/18 and told her to make sure she keeps it.

## 2018-08-06 ENCOUNTER — Ambulatory Visit (INDEPENDENT_AMBULATORY_CARE_PROVIDER_SITE_OTHER): Payer: Self-pay | Admitting: Psychiatry

## 2018-08-06 ENCOUNTER — Other Ambulatory Visit: Payer: Self-pay

## 2018-08-06 DIAGNOSIS — F321 Major depressive disorder, single episode, moderate: Secondary | ICD-10-CM

## 2018-08-06 DIAGNOSIS — F410 Panic disorder [episodic paroxysmal anxiety] without agoraphobia: Secondary | ICD-10-CM

## 2018-08-06 MED ORDER — CLONAZEPAM 0.5 MG PO TABS: 0.5000 mg | ORAL_TABLET | Freq: Two times a day (BID) | ORAL | 1 refills | Status: DC | PRN

## 2018-08-06 MED ORDER — VORTIOXETINE HBR 20 MG PO TABS: 20.0000 mg | ORAL_TABLET | Freq: Every day | ORAL | 1 refills | Status: DC
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
## 2018-09-02 ENCOUNTER — Other Ambulatory Visit: Payer: Self-pay | Admitting: Neurology

## 2018-09-03 ENCOUNTER — Ambulatory Visit (INDEPENDENT_AMBULATORY_CARE_PROVIDER_SITE_OTHER): Payer: No Typology Code available for payment source | Admitting: Psychiatry

## 2018-09-03 ENCOUNTER — Other Ambulatory Visit: Payer: Self-pay

## 2018-09-03 DIAGNOSIS — F410 Panic disorder [episodic paroxysmal anxiety] without agoraphobia: Secondary | ICD-10-CM

## 2018-09-03 DIAGNOSIS — F321 Major depressive disorder, single episode, moderate: Secondary | ICD-10-CM | POA: Diagnosis not present

## 2018-09-03 DIAGNOSIS — F411 Generalized anxiety disorder: Secondary | ICD-10-CM | POA: Diagnosis not present

## 2018-09-03 MED ORDER — VORTIOXETINE HBR 20 MG PO TABS: 20.0000 mg | ORAL_TABLET | Freq: Every day | ORAL | 1 refills | Status: DC

## 2018-09-03 MED ORDER — CLONAZEPAM 0.5 MG PO TABS: 0.5000 mg | ORAL_TABLET | Freq: Two times a day (BID) | ORAL | 1 refills | Status: DC | PRN
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
## 2018-09-28 ENCOUNTER — Other Ambulatory Visit (HOSPITAL_COMMUNITY): Payer: Self-pay | Admitting: Psychiatry

## 2018-09-29 ENCOUNTER — Other Ambulatory Visit (HOSPITAL_COMMUNITY): Payer: Self-pay | Admitting: Psychiatry

## 2018-10-03 ENCOUNTER — Ambulatory Visit (INDEPENDENT_AMBULATORY_CARE_PROVIDER_SITE_OTHER): Payer: No Typology Code available for payment source | Admitting: Psychiatry

## 2018-10-03 ENCOUNTER — Other Ambulatory Visit: Payer: Self-pay

## 2018-10-03 DIAGNOSIS — F411 Generalized anxiety disorder: Secondary | ICD-10-CM | POA: Diagnosis not present

## 2018-10-03 DIAGNOSIS — F324 Major depressive disorder, single episode, in partial remission: Secondary | ICD-10-CM

## 2018-10-03 DIAGNOSIS — F410 Panic disorder [episodic paroxysmal anxiety] without agoraphobia: Secondary | ICD-10-CM

## 2018-10-03 MED ORDER — VORTIOXETINE HBR 20 MG PO TABS: 20.0000 mg | ORAL_TABLET | Freq: Every day | ORAL | 2 refills | Status: AC

## 2018-10-03 MED ORDER — CLONAZEPAM 0.5 MG PO TABS: 0.5000 mg | ORAL_TABLET | Freq: Two times a day (BID) | ORAL | 0 refills | Status: DC | PRN
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
## 2018-11-11 ENCOUNTER — Other Ambulatory Visit: Payer: Self-pay | Admitting: Neurology

## 2018-12-17 ENCOUNTER — Ambulatory Visit: Payer: PRIVATE HEALTH INSURANCE | Admitting: Obstetrics & Gynecology

## 2018-12-18 ENCOUNTER — Ambulatory Visit: Payer: PRIVATE HEALTH INSURANCE | Admitting: Obstetrics & Gynecology

## 2019-01-01 ENCOUNTER — Other Ambulatory Visit: Payer: Self-pay

## 2019-01-01 ENCOUNTER — Encounter: Payer: Self-pay | Admitting: Neurology

## 2019-01-01 ENCOUNTER — Ambulatory Visit: Payer: PRIVATE HEALTH INSURANCE | Admitting: Neurology

## 2019-01-01 VITALS — BP 121/85 | HR 60 | Temp 97.7°F | Ht 60.0 in | Wt 195.0 lb

## 2019-01-01 DIAGNOSIS — H348112 Central retinal vein occlusion, right eye, stable: Secondary | ICD-10-CM

## 2019-01-01 DIAGNOSIS — R569 Unspecified convulsions: Secondary | ICD-10-CM

## 2019-01-01 DIAGNOSIS — G4451 Hemicrania continua: Secondary | ICD-10-CM

## 2019-01-01 DIAGNOSIS — M542 Cervicalgia: Secondary | ICD-10-CM

## 2019-01-01 DIAGNOSIS — R930 Abnormal findings on diagnostic imaging of skull and head, not elsewhere classified: Secondary | ICD-10-CM | POA: Diagnosis not present

## 2019-01-01 MED ORDER — ACETAMINOPHEN-CODEINE #3 300-30 MG PO TABS: ORAL_TABLET | ORAL | 3 refills | Status: DC

## 2019-01-01 MED ORDER — SUMATRIPTAN SUCCINATE 100 MG PO TABS: 100.0000 mg | ORAL_TABLET | Freq: Once | ORAL | 3 refills | Status: DC | PRN

## 2019-01-01 MED ORDER — VERAPAMIL HCL ER 240 MG PO TBCR: 240.0000 mg | EXTENDED_RELEASE_TABLET | Freq: Every day | ORAL | 3 refills | Status: DC

## 2019-01-01 NOTE — Progress Notes (Signed)
[facility]  PATIENT: Angel Rodriguez DOB: [birth year]  REFERRING DOCTOR OR PCP:   SOURCE: patient, ED records, images on PACS, reports in EMR  _________________________________   HISTORICAL  CHIEF COMPLAINT:  Chief Complaint  Patient presents with  . Follow-up    RM 12, alone. Last seen 06/27/2018. She was referred to [facility]. Has been three times (Did EEG), they told her she did not have any active seizures. Had two seizures but pt cannot remember when.   . Headache    takes Tylenol #3 prn, requesting refill    HISTORY OF PRESENT ILLNESS:  Angel Rodriguez is a 43 y.o. woman with frequent headaches and h/o seizure.  Update 01/01/19: Since the last visit, she has had a second opinion at [facility] for the abnormal MRI and seizures.  She had a repeat LP 08/13/2018 and the CSF was completely normal.  Only 4 cells and no OCB.    She was placed on Lyrica for the seizures but due to tolerability, switched to Vimpat 100 mg po bid.  She had been on Keppra and had one seizure on it but felt it helped in general.    She has not had any seizures since she has been on Vimpat  She is also on Trintellix for her depression and anxiety and she feels it has helped her a lot.   She is less irritable.    She is having headaches still -with mild to moderate constant right-sided pain and 3-4 severe headaches a month.   She takes an Imitrex +/- Tyl # 3 when she gets a headache.  She reports her work does not allow her to take medications during the workday.  I will write a note.    When headaches have been severe she responds to a sphenopalatine ganglion block and splenius capitus TPI's have helped for the neck pain and occipital neuralgia-like component of pain.    She notes no new visual changes.    Reviewed multiple neurology notes, lab from [facility] as above.    MRI brain 07/06/2018 personally reviewed and compared to studies from January 2020.  It shows 2 foci in the anterior left frontal lobe.   One was mildly increased in size compared to the January MRI and one was decreased in size.  Another focus seen on the previous MRI in the left parasagittal frontal lobe has resolved.  She is concerned that the growths could represent brain tumor.  Due to the fluctuating status and resolution of one of the foci, this is highly unlikely.  They could be due to vasculitis or demyelination or other inflammation   Update 06/27/2018 (virtual) She continues to experience headaches.  These are intermittent and are always right-sided worse than left side.  Most headaches are associated with nausea, photophobia and phonophobia.  Some are associated with erythema of the right eye.  Imitrex does not always help.  Tylenol 3 often helps..  She is not noting vision changes with the headaches..  She is having these migrainous headaches two days a week.   She does not have one right now.    In January she had neurologic symptoms including numbness and weakness in her right hand.  Details are in my visit notes from that day.  She feels she is better and she had complete resolution of the right-sided symptoms.  The MRI is unusual with 3 juxtacortical and subcortical enhancing lesions, 2 of which were present on MRI 02/14/2016 with 1 of them enhancing  at that time.  An MRI 07/02/2015 showed the 2 previous lesions without enhancement.  She was supposed to get a second opinion in late March at [facility] but it was rescheduled due to Covid-19.  I encouraged her to follow through as I do not have a clear diagnosis for her symptoms and MRI changes.  We discussed that the numbness and clumsiness in the hands she experienced could be explained by the enhancing lesions but the etiology is unclear  She feels stressed and is anxious.   She sees psychiatry.   She is on Trintellix and prn hydroxyzine to help with anxiety.        Update 03/18/2018: She was admitted to the hospital last week after presenting with right hand numbness, weakness  and clumsiness.  She called a work acquaintance during the visit to describe her symptoms.  Although she seemed dazed while the symptoms were occurring before she left to go to the hospital, she did not have any generalized tonic-clonic activity.  She was less responsive but was able to understand what was being said to her and was communicating.  Numbness spread up over ten minutes to the entire leg and then was followed by the rest of the right side, including the face.   She had a mild headache but less severe than many of the headaches she has had in the past.      Notes from her hospital stay and multiple laboratory results and imaging studies were reviewed.  There was a concern that she might of had a seizure and she was started on Keppra.  She had imaging performed last week.  I personally reviewed the MRI images on PACS.    She has the 2 foci seen in 2017 in the left frontal subcortical white matter but the one focus has some enhancement and is larger.  The other is unchanged.   There is a newer midline subcortical focus, also on the left that enhances.  No spinal plaques.  She also had an LP.  Cells were mildly elevated (10 WBC  89% lymphs; 11% mono) and protein was normal.   Glucose was slightly elevated at 84.  OCB is pending.  VDRL is pending.    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   In 2016, she had a seizure.  MRI showed 2 moderate size nonenhancing subcortical foci in the left frontal lobe.   A repeat MRI 02/14/2016 showed 2 juxtacortical left frontal lobe lesions.  Both were present on the 06/30/2015 MRI.  There was punctate enhancement adjacent to the more anterior frontal focus that had not been present on the previous MRI.  Diffusion weighted images were normal.  Between 2017 and 2019, she was seen multiple times for right-sided headaches.  Intranasal sphenopalatine ganglion block or up of rapid benefit.   Additionally verapamil seem to reduce the frequency.  Update 02/05/2018: She reports that headaches are back. They occur daily with migrainous features of nausea and photophobia/phonophobia.   They returned over a month ago.    She also notes that the right eye becomes red.     She feels stress worsens the headaches.     Movements worsen them.    She is taking Imitrex with benefit but headaches return.       She is on verapamil and that seemed to help her some for a while.  Headaches occur 30/30 of the last days and for > 4 hours/day.       Last sphenocath procedure was April 2019 and it knocked headaches out completely x 1 month and then they returned mildly.     She has a h/o central retinal vein occlusion and is on drops ([facility])  She had a single seizure in March 2016 and is not on an anticonvulsant.    Insomnia is doing well on her medications.     Update 10/25/2017: Her headaches are doing better since the sphenocath procedure and the trigger point injections.    She still has some headache pain but it is not severe.    Pain sometimes builds up some as the day goes on, especially at work.     She denies any numbness, weakness or gait change.  She sees [facility] for her h/o central retinal vein occlusion.  She is still on eye drops.      She only had one seizure 3 years ago and is not on any anticonvulsant.       She has some insomnia.    She has trouble falling asleep and staying asleep.   She changed her clonazepma to 0.25 qAM and 0.75 mg qPM.  She has ovarian cancer and had surgery.   She had ovarian torsion but pathology showed possible cancer so she is going to have another operation.      Update 06/27/2017: HA's worsened again last week.    The conjunctivitis returned the next day.   All the pain is on her right side only.     In the past sphenopalatine ganglion blocks have helped the most.    She also has received some benefit form ONB/TPIs in the past.    She denies  any additional seizures.      Insomnia was doing better and was helped by clonazepam.  However, she is doing worse since the headache has recurred.  Clonazepam also helps her anxiety.   Update 04/20/2017: Headaches are doing fairly well.   She is now having 3-4 HA's a month.    Verapamil has helped reduce the frequency of headaches.     When needed, Imitrex often helps.     She will sometimes try Tylenol first.   Due to low platelets, she can't take NSAIDs.     The TCPenia was felt to be due to fibroids and these are being treated.     She still gets redness in the right eye, not always associated with HA.    In the past when her headaches were more severe, she had the Sphenocath procedure and it was very beneficial.    Occipital nerve blocks or TPI's have also been beneficial in the past.    She has not had any more seizure.   She just had one in 2016   From 07/11/2016: HA:   Her headaches were doing much better but they are have been occurring more the past month or two. Additionally she continues to have conjunctival redness on the right.     They greatly improved after starting Verapamil and having occipital nerve block/splenius capitis muscle injection at the first visit and then [place] at the second, third and fourth visits.  After the second Sphenocath, the conjunctival redness got much better but then returned. After the fourth procedure, she went nearly pain-free for 6 months until milder headaches returned and activities slowly worsening. Pain improves  with Imitrex and rest much of  the time.  She no longer can take NSAID    She tolerates the verapamil well but has occasional constipation .     Studies:   MRI of the brain showed a single small T2/FLAIR hyperintense focus in the left frontal subcortical white matter. The MR venogram was normal. ESR and ANA were normal.    Mood:  Anxiety is much better.   Clonazepam with Buspar has helped better than the escitalopram.    Seizure:   In March  2016, she had a seizure.   She had an aura of numbness in her right hand and then passed out.   She is not sure there was any generalized tonic-clonic activity. Her children did not tell her that she had any. However, she did have urinary incontinence. She does not remember much that day.    Since then, she has not had any more seizures.  She has not needed any AED as she had only the one seizure/spell.       Insomnia:   She is sleeping better since starting clonazepam and having much less headache  Thrombocytopenia:   Her platelet count was reduced to 78,000 and she saw her doctor in hematology. Repeat platelet count was 83,000. She was advised to stop anti-inflammatories.  REVIEW OF SYSTEMS: Constitutional: No fevers, chills, sweats, or change in appetite.  She has insomnia. Eyes: as above Ear, nose and throat: No hearing loss, ear pain, nasal congestion, sore throat Cardiovascular: No chest pain, palpitations Respiratory: No shortness of breath at rest or with exertion.   No wheezes GastrointestinaI: No nausea, vomiting, diarrhea, abdominal pain, fecal incontinence Genitourinary: No dysuria, urinary retention or frequency.  No nocturia.   Possible ovarian cancer Musculoskeletal: No neck pain, back pain Integumentary: No rash, pruritus, skin lesions Neurological: as above Psychiatric: No depression at this time.  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 Allergic/Immunologic: No itchy/runny eyes, nasal congestion, recent allergic reactions, rashes  ALLERGIES: Allergies  Allergen Reactions  . Ivp Dye [Iodinated Diagnostic Agents] Hives, Itching and Swelling  . Bee Venom Swelling    HOME MEDICATIONS:  Current Outpatient Medications:  .  acetaminophen (TYLENOL) 500 MG tablet,  Take 500 mg by mouth every 6 (six) hours as needed for headache (pain)., Disp: , Rfl:  .  acetaminophen-codeine (TYLENOL #3) 300-30 MG tablet, One to two prn headache.  No mor ethan 4/day, Disp: 28 tablet, Rfl: 3 .  atropine 1 % ophthalmic solution, Place 1 drop into the right eye 3 (three) times daily. , Disp: , Rfl:  .  brimonidine (ALPHAGAN P) 0.1 % SOLN, Place 1 drop into the right eye 3 (three) times daily. , Disp: , Rfl:  .  calcium carbonate (TUMS EX) 750 MG chewable tablet, Chew 2 tablets by mouth daily as needed for heartburn. , Disp: , Rfl:  .  clonazePAM (KLONOPIN) 0.5 MG tablet, Take 1 tablet (0.5 mg total) by mouth 2 (two) times daily as needed for anxiety., Disp: 180 tablet, Rfl: 0 .  Cyanocobalamin (VITAMIN B-12 PO), Take 1 tablet by mouth daily. , Disp: , Rfl:  .  dorzolamide-timolol (COSOPT) 22.3-6.8 MG/ML ophthalmic solution, Place 1 drop into the right eye 2 (two) times daily. , Disp: , Rfl:  .  fluticasone (FLONASE) 50 MCG/ACT nasal spray, Place 1-2 sprays into both nostrils daily as needed for  allergies. , Disp: , Rfl:  .  Lacosamide 100 MG TABS, Take 100 mg by mouth 2 (two) times a day., Disp: , Rfl:  .  latanoprost (XALATAN) 0.005 % ophthalmic solution, Place 1 drop into both eyes at bedtime., Disp: , Rfl:  .  Multiple Vitamin (MULTIVITAMIN WITH MINERALS) TABS tablet, Take 1 tablet by mouth daily., Disp: , Rfl:  .  SUMAtriptan (IMITREX) 100 MG tablet, Take 1 tablet (100 mg total) by mouth once as needed for migraine. May repeat in 2 hours if headache persists or recurs., Disp: 18 tablet, Rfl: 3 .  verapamil (CALAN-SR) 240 MG CR tablet, Take 1 tablet (240 mg total) by mouth at bedtime., Disp: 90 tablet, Rfl: 3 .  Vitamin D, Ergocalciferol, (DRISDOL) 1.25 MG (50000 UT) CAPS capsule, Take 1 capsule (50,000 Units total) by mouth every 7 (seven) days. (Patient taking differently: Take 50,000 Units by mouth every Saturday. ), Disp: 12 capsule, Rfl: 1 .  vortioxetine HBr (TRINTELLIX)  20 MG TABS tablet, Take 1 tablet (20 mg total) by mouth daily., Disp: 30 tablet, Rfl: 2  PAST MEDICAL HISTORY: Past Medical History:  Diagnosis Date  . Anxiety   . 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 Cluster headaches    started after seizure and migraines  . Complication of anesthesia    trouble with short term memory after surgeries  . Depression   . Fibroids    s/p TLH, bilateral salpingectomy  . GERD (gastroesophageal reflux disease)    patient thinks due to medications  . History of anemia    prior to hysterectomy  . History of pneumonia   . History of trichomoniasis   . Seizures ([place]) x 1 2-3 yrs ago none since   once saw neurologist, full body brain MRI and another 6 months lster  . Status post right oophorectomy   . Thrombocytopenia ([place])    sees [place] with Heme Onc    PAST SURGICAL HISTORY: Past Surgical History:  Procedure Laterality Date  . CYSTOSCOPY N/A 07/31/2017   Procedure: CYSTOSCOPY;  Surgeon: Megan Salon, MD;  Location: [facility];  Service: Gynecology;  Laterality: N/A;  . ENDOMETRIAL ABLATION    . EYE SURGERY     laser - removed blood from right eye  . HYSTEROSCOPY W/D&C    . LAPAROSCOPY N/A 09/19/2017   Procedure: LAPAROSCOPY DIAGNOSTIC WITH RIGHT OOPHERECTOMY, LYSIS OF ADHESIONS;  Surgeon: Salvadore Dom, MD;  Location: [facility];  Service: Gynecology;  Laterality: N/A;  . OMENTECTOMY N/A 11/21/2017   Procedure: OMENTECTOMY AND PELVIC WASHINGS;  Surgeon: Isabel Caprice, MD;  Location: [facility];  Service: Gynecology;  Laterality: N/A;  . ROBOTIC ASSISTED SALPINGO OOPHERECTOMY Left 11/21/2017   Procedure: XI ROBOTIC ASSISTED LEFT SALPINGO OOPHORECTOMY;  Surgeon: Isabel Caprice, MD;  Location: [facility];  Service: Gynecology;  Laterality: Left;  . TOTAL LAPAROSCOPIC HYSTERECTOMY WITH SALPINGECTOMY Bilateral 07/31/2017   Procedure: TOTAL LAPAROSCOPIC HYSTERECTOMY WITH SALPINGECTOMY;  Surgeon: Megan Salon, MD;  Location: [facility];  Service:  Gynecology;  Laterality: Bilateral;  20 week size uterus/ Alexis bag in room/ need 4.5 hours  . TUBAL LIGATION    . WISDOM TOOTH EXTRACTION      FAMILY HISTORY: Family History  Problem Relation Age of Onset  . Alcoholism Father   . Cirrhosis Father   . Alcohol abuse Father   . Cancer Maternal Grandmother        unsure of type; died of other causes  .  Heart disease Maternal Aunt   . Drug abuse Cousin     SOCIAL HISTORY:  Social History   Socioeconomic History  . Marital status: Single    Spouse name: Not on file  . Number of children: Not on file  . Years of education: Not on file  . Highest education level: Not on file  Occupational History  . Not on file  Social Needs  . Financial resource strain: Not on file  . Food insecurity    Worry: Not on file    Inability: Not on file  . Transportation needs    Medical: Not on file    Non-medical: Not on file  Tobacco Use  . Smoking status: Current Some Day Smoker    Years: 20.00    Types: E-cigarettes, Cigarettes  . Smokeless tobacco: Never Used  . Tobacco comment: occasional cigerate and vap  Substance and Sexual Activity  . Alcohol use: Yes    Comment: occ wine  . Drug use: No  . Sexual activity: Yes    Birth control/protection: Surgical    Comment: hysterectomy  Lifestyle  . Physical activity    Days per week: Not on file    Minutes per session: Not on file  . Stress: Not on file  Relationships  . Social Herbalist on phone: Not on file    Gets together: Not on file    Attends religious service: Not on file    Active member of club or organization: Not on file    Attends meetings of clubs or organizations: Not on file    Relationship status: Not on file  . Intimate partner violence    Fear of current or ex partner: Not on file    Emotionally abused: Not on file    Physically abused: Not on file    Forced sexual activity: Not on file  Other Topics Concern  . Not on file  Social History  Narrative  . Not on file     PHYSICAL EXAM  Vitals:   01/01/19 1054  BP: 121/85  Pulse: 60  Temp: 97.7 F (36.5 C)  SpO2: 98%  Weight: 195 lb (88.5 kg)  Height: 5' (1.524 m)    Body mass index is 38.08 kg/m.   General: The patient is well-developed and well-nourished and in no acute distress  Eyes:   There is no conjunctival erythema on the right today..  Normal funduscopic examination.  Neck: T her neck is tender over the right occiput and splenius capitis muscle.  Range of motion is fairly normal  Neurologic Exam  Mental status: The patient is alert and oriented x 3 at the time of the examination. The patient has apparent normal recent and remote memory, with an apparently normal attention span and concentration ability.   Speech is normal.  Cranial nerves: Extraocular movements are full.  She has very mild right ptosis and facial strength is normal elsewhere.  There is normal facial sensation to soft touch.   Trapezius and sternocleidomastoid strength is normal. No dysarthria is noted.  The tongue is midline, and the patient has symmetric elevation of the soft palate. No obvious hearing deficits are noted.  Motor:  Muscle bulk is normal.   Tone is normal. Strength is  5 / 5 in all 4 extremities.   Sensory: She has normal sensation to touch and vibration in the arms and legs   Gait and station: Station is normal.   Gait and the tandem  gait are normal.  Romberg is negative.    DIAGNOSTIC DATA (LABS, IMAGING, TESTING) - I reviewed patient records, labs, notes, testing and imaging myself where available.  Lab Results  Component Value Date   WBC 8.8 07/10/2018   HGB 14.4 07/10/2018   HCT 42.8 07/10/2018   MCV 87.9 07/10/2018   PLT 59 (L) 07/10/2018      Component Value Date/Time   NA 142 07/10/2018 0915   NA 138 08/16/2017 1627   NA 138 08/31/2016 0957   K 4.0 07/10/2018 0915   K 3.9 08/31/2016 0957   CL 111 07/10/2018 0915   CO2 21 (L) 07/10/2018 0915    CO2 22 08/31/2016 0957   GLUCOSE 148 (H) 07/10/2018 0915   GLUCOSE 106 08/31/2016 0957   BUN 9 07/10/2018 0915   BUN 8 08/16/2017 1627   BUN 8.6 08/31/2016 0957   CREATININE 0.82 07/10/2018 0915   CREATININE 0.8 08/31/2016 0957   CALCIUM 10.5 (H) 07/10/2018 0915   CALCIUM 10.3 08/31/2016 0957   PROT 7.6 07/10/2018 0915   PROT 7.2 08/16/2017 1627   PROT 7.6 08/31/2016 0957   ALBUMIN 4.2 07/10/2018 0915   ALBUMIN 4.6 08/16/2017 1627   ALBUMIN 3.9 08/31/2016 0957   AST 28 07/10/2018 0915   AST 18 08/31/2016 0957   ALT 30 07/10/2018 0915   ALT 23 08/31/2016 0957   ALKPHOS 91 07/10/2018 0915   ALKPHOS 62 08/31/2016 0957   BILITOT 0.7 07/10/2018 0915   BILITOT 0.3 08/16/2017 1627   BILITOT 0.43 08/31/2016 0957   GFRNONAA >60 07/10/2018 0915   GFRAA >60 07/10/2018 0915           ASSESSMENT AND PLAN  Seizure (HCC)  Abnormal MRI of head  Hemicrania continua  Central retinal vein occlusion of right eye, unspecified complication status  Neck pain   1.   She will continue Vimpat for probable seizures.   Continue Trintellix/clonazepam for mood 2.   Note for work to be allowed to take sumatriptan and acetaminophen at work.  At home can take sumatriptan with Tylenol #3. 3.   Try to stay active. 4.   Trigger point injection with 80 mg Depo-Medrol in 3 cc Marcaine into the splenius capitis muscle and surrounding region on the right using sterile technique.  .  She tolerated the injection well and pain was better afterwards.   5.  If new symptoms will need to re-image due to changes on last MRI worrisome for inflammation or vasculitis.  45-minute face-to-face evaluation with greater than one half the time counseling and coordinating care about her neurologic symptoms and anxiety.Nanine Means. Felecia Shelling, MD, PhD 21/30/8657, 84:69 PM Certified in Neurology, Clinical Neurophysiology, Sleep Medicine, Pain Medicine and Neuroimaging   [facility] [address]  [place],  [address] [phone]

## 2019-01-07 ENCOUNTER — Ambulatory Visit (INDEPENDENT_AMBULATORY_CARE_PROVIDER_SITE_OTHER): Payer: No Typology Code available for payment source | Admitting: Psychiatry

## 2019-01-07 ENCOUNTER — Other Ambulatory Visit: Payer: Self-pay

## 2019-01-07 DIAGNOSIS — F324 Major depressive disorder, single episode, in partial remission: Secondary | ICD-10-CM | POA: Diagnosis not present

## 2019-01-07 DIAGNOSIS — F411 Generalized anxiety disorder: Secondary | ICD-10-CM | POA: Diagnosis not present

## 2019-01-07 DIAGNOSIS — F410 Panic disorder [episodic paroxysmal anxiety] without agoraphobia: Secondary | ICD-10-CM

## 2019-01-07 MED ORDER — VORTIOXETINE HBR 20 MG PO TABS: 20.0000 mg | ORAL_TABLET | Freq: Every day | ORAL | 2 refills | Status: AC

## 2019-01-07 MED ORDER — CLONAZEPAM 0.5 MG PO TABS: 0.5000 mg | ORAL_TABLET | Freq: Two times a day (BID) | ORAL | 0 refills | Status: DC | PRN
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
## 2019-01-14 ENCOUNTER — Other Ambulatory Visit: Payer: Self-pay

## 2019-01-17 ENCOUNTER — Ambulatory Visit: Payer: PRIVATE HEALTH INSURANCE | Admitting: Obstetrics & Gynecology

## 2019-01-17 ENCOUNTER — Other Ambulatory Visit: Payer: Self-pay

## 2019-01-17 ENCOUNTER — Encounter: Payer: Self-pay | Admitting: Obstetrics & Gynecology

## 2019-01-17 VITALS — BP 134/80 | HR 72 | Temp 96.7°F | Resp 12 | Ht 59.5 in | Wt 194.0 lb

## 2019-01-17 DIAGNOSIS — E7800 Pure hypercholesterolemia, unspecified: Secondary | ICD-10-CM

## 2019-01-17 DIAGNOSIS — R7989 Other specified abnormal findings of blood chemistry: Secondary | ICD-10-CM

## 2019-01-17 DIAGNOSIS — D3910 Neoplasm of uncertain behavior of unspecified ovary: Secondary | ICD-10-CM | POA: Diagnosis not present

## 2019-01-17 DIAGNOSIS — N898 Other specified noninflammatory disorders of vagina: Secondary | ICD-10-CM

## 2019-01-17 DIAGNOSIS — Z01419 Encounter for gynecological examination (general) (routine) without abnormal findings: Secondary | ICD-10-CM | POA: Diagnosis not present

## 2019-01-17 DIAGNOSIS — R350 Frequency of micturition: Secondary | ICD-10-CM

## 2019-01-17 DIAGNOSIS — Z0000 Encounter for general adult medical examination without abnormal findings: Secondary | ICD-10-CM

## 2019-01-17 DIAGNOSIS — Z6838 Body mass index (BMI) 38.0-38.9, adult: Secondary | ICD-10-CM

## 2019-01-17 LAB — POCT URINALYSIS DIPSTICK
Bilirubin, UA: NEGATIVE
Blood, UA: NEGATIVE
Glucose, UA: NEGATIVE
Ketones, UA: NEGATIVE
Leukocytes, UA: NEGATIVE
Nitrite, UA: NEGATIVE
Protein, UA: NEGATIVE
Urobilinogen, UA: 0.2 E.U./dL
pH, UA: 5 (ref 5.0–8.0)

## 2019-01-17 MED ORDER — NYSTATIN 100000 UNIT/GM EX CREA: 1.0000 "application " | TOPICAL_CREAM | Freq: Two times a day (BID) | CUTANEOUS | 1 refills | Status: DC

## 2019-01-17 NOTE — Progress Notes (Signed)
43 y.o. [id number] Single Other or two or more races female here for annual exam.  Pt reports vaginal odor and some urinary urgency for the past two weeks.  She is SA with same person for many years.  Desires STD testing today.  Denies vaginal bleeding.      Had increased seizures this summer.  She was referred to [facility] for [facility] opinion.  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  These are of of unknown etiology.   Frustrated with weight.  Would like to work on this.  Working at [employer].  Needs physical form filled out today.  Blood work is requested.     Patient's last menstrual period was 05/02/2017.          Sexually active: Yes.    The current method of family planning is status post hysterectomy.    Exercising: No.  The patient does not participate in regular exercise at present. Smoker:  Former  Health Maintenance: Pap:  03/29/17 Neg:Neg HR HPV History of abnormal Pap:  Yes (cryo >20 years) MMG:  01/24/16 BIRADS 2 benign Colonoscopy:  n/a BMD:   n/a TDaP:  unsure Pneumonia vaccine(s):  n/a Shingrix:   n/a Hep C testing: n/a Screening Labs: discuss today   reports that she quit smoking about 8 months ago. Her smoking use included cigarettes and e-cigarettes. She quit after 20.00 years of use. She has never used smokeless tobacco. She reports previous alcohol use. She reports that she does not use drugs.  Past Medical History:  Diagnosis Date  . Anxiety   . 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 Cluster headaches    started after seizure and migraines  . Complication of anesthesia    trouble with short term memory after surgeries  . Depression   . Fibroids    s/p TLH, bilateral salpingectomy  . GERD (gastroesophageal reflux disease)    patient thinks due to medications  . History of anemia    prior to hysterectomy  . History of pneumonia    . History of trichomoniasis   . Seizures ([place]) x 1 2-3 yrs ago none since   once saw neurologist, full body brain MRI and another 6 months lster  . Status post right oophorectomy   . Thrombocytopenia ([place])    sees [place] with Heme Onc    Past Surgical History:  Procedure Laterality Date  . CYSTOSCOPY N/A 07/31/2017   Procedure: CYSTOSCOPY;  Surgeon: Megan Salon, MD;  Location: [facility];  Service: Gynecology;  Laterality: N/A;  . ENDOMETRIAL ABLATION    . EYE SURGERY     laser - removed blood from right eye  . HYSTEROSCOPY W/D&C    . LAPAROSCOPY N/A 09/19/2017   Procedure: LAPAROSCOPY DIAGNOSTIC WITH RIGHT OOPHERECTOMY, LYSIS OF ADHESIONS;  Surgeon: Salvadore Dom, MD;  Location: [facility];  Service: Gynecology;  Laterality: N/A;  . OMENTECTOMY N/A 11/21/2017   Procedure: OMENTECTOMY AND PELVIC WASHINGS;  Surgeon: Isabel Caprice, MD;  Location: [facility];  Service: Gynecology;  Laterality: N/A;  . ROBOTIC ASSISTED SALPINGO OOPHERECTOMY Left 11/21/2017   Procedure: XI ROBOTIC ASSISTED LEFT SALPINGO OOPHORECTOMY;  Surgeon: Isabel Caprice, MD;  Location: [facility];  Service: Gynecology;  Laterality: Left;  . TOTAL LAPAROSCOPIC HYSTERECTOMY WITH SALPINGECTOMY Bilateral 07/31/2017   Procedure: TOTAL LAPAROSCOPIC HYSTERECTOMY WITH SALPINGECTOMY;  Surgeon: Megan Salon,  MD;  Location: [facility];  Service: Gynecology;  Laterality: Bilateral;  20 week size uterus/ Alexis bag in room/ need 4.5 hours  . TUBAL LIGATION    . WISDOM TOOTH EXTRACTION      Current Outpatient Medications  Medication Sig Dispense Refill  . acetaminophen (TYLENOL) 500 MG tablet Take 500 mg by mouth every 6 (six) hours as needed for headache (pain).    [name] acetaminophen-codeine (TYLENOL #3) 300-30 MG tablet One to two prn headache.  No mor ethan 4/day 28 tablet 3  . brimonidine (ALPHAGAN P) 0.1 % SOLN Place 1 drop into the right eye 3 (three) times daily.     . calcium carbonate (TUMS EX) 750 MG chewable tablet Chew 2 tablets  by mouth daily as needed for heartburn.     . clonazePAM (KLONOPIN) 0.5 MG tablet Take 1 tablet (0.5 mg total) by mouth 2 (two) times daily as needed for anxiety. 180 tablet 0  . Cyanocobalamin (VITAMIN B-12 PO) Take 1 tablet by mouth daily.     . dorzolamide-timolol (COSOPT) 22.3-6.8 MG/ML ophthalmic solution Place 1 drop into the right eye 2 (two) times daily.     . fluticasone (FLONASE) 50 MCG/ACT nasal spray Place 1-2 sprays into both nostrils daily as needed for allergies.     . Lacosamide 100 MG TABS Take 100 mg by mouth 2 (two) times a day.    . latanoprost (XALATAN) 0.005 % ophthalmic solution Place 1 drop into both eyes at bedtime.    . levETIRAcetam (KEPPRA) 500 MG tablet Take by mouth as needed.    . Multiple Vitamin (MULTIVITAMIN WITH MINERALS) TABS tablet Take 1 tablet by mouth daily.    . SUMAtriptan (IMITREX) 100 MG tablet Take 1 tablet (100 mg total) by mouth once as needed for migraine. May repeat in 2 hours if headache persists or recurs. 18 tablet 3  . verapamil (CALAN-SR) 240 MG CR tablet Take 1 tablet (240 mg total) by mouth at bedtime. 90 tablet 3  . Vitamin D, Ergocalciferol, (DRISDOL) 1.25 MG (50000 UT) CAPS capsule Take 1 capsule (50,000 Units total) by mouth every 7 (seven) days. (Patient taking differently: Take 50,000 Units by mouth every Saturday. ) 12 capsule 1  . vortioxetine HBr (TRINTELLIX) 20 MG TABS tablet Take 1 tablet (20 mg total) by mouth daily. 30 tablet 2  . atropine 1 % ophthalmic solution Place 1 drop into the right eye 3 (three) times daily.      No current facility-administered medications for this visit.     Family History  Problem Relation Age of Onset  . Alcoholism Father   . Cirrhosis Father   . Alcohol abuse Father   . Cancer Maternal Grandmother        unsure of type; died of other causes  . Heart disease Maternal Aunt   . Drug abuse Cousin     Review of Systems  Constitutional: Negative.   HENT: Negative.   Eyes: Negative.    Respiratory: Negative.   Cardiovascular: Negative.   Gastrointestinal: Negative.   Endocrine: Negative.   Genitourinary: Positive for frequency and vaginal discharge.       Vaginal odor  Musculoskeletal: Negative.   Skin: Negative.   Allergic/Immunologic: Negative.   Neurological: Negative.   Hematological: Negative.   Psychiatric/Behavioral: Negative.     Exam:   BP 134/80 (BP Location: Right Arm, Patient Position: Sitting, Cuff Size: Normal)   Pulse 72   Temp (!) 96.7 F (35.9 C) (Temporal)  Resp 12   Ht 4' 11.5" (1.511 m)   Wt 194 lb (88 kg)   LMP 05/02/2017   BMI 38.53 kg/m      Height: 4' 11.5" (151.1 cm)  Ht Readings from Last 3 Encounters:  01/17/19 4' 11.5" (1.511 m)  01/01/19 5' (1.524 m)  07/06/18 5' (1.524 m)    General appearance: alert, cooperative and appears stated age Head: Normocephalic, without obvious abnormality, atraumatic Neck: no adenopathy, supple, symmetrical, trachea midline and thyroid normal to inspection and palpation Lungs: clear to auscultation bilaterally Breasts: normal appearance, no masses or tenderness Heart: regular rate and rhythm Abdomen: soft, non-tender; bowel sounds normal; no masses,  no organomegaly Extremities: extremities normal, atraumatic, no cyanosis or edema Skin: Skin color, texture, turgor normal. No rashes or lesions Lymph nodes: Cervical, supraclavicular, and axillary nodes normal. No abnormal inguinal nodes palpated Neurologic: Grossly normal   Pelvic: External genitalia:  no lesions              Urethra:  normal appearing urethra with no masses, tenderness or lesions              Bartholins and Skenes: normal                 Vagina: normal appearing vagina with normal color and discharge, no lesions              Cervix: absent              Pap taken: No. Bimanual Exam:  Uterus:  uterus absent              Adnexa: no mass, fullness, tenderness               Rectovaginal: Confirms               Anus:   normal sphincter tone, no lesions  Chaperone was present for exam.  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  Every 1-2 year guidelines discussed pap smear not indicated due to hysterectomy Nystatin cream to pharmacy Referral to Dr. Leafy Ro for weight management assistance CMP, Lipids obtained today Ca-125 obtained today.  She has not had follow up with gyn oncology and declines going back at this time. Return annually or prn

## 2019-01-18 ENCOUNTER — Other Ambulatory Visit: Payer: Self-pay | Admitting: *Deleted

## 2019-01-18 DIAGNOSIS — Z20828 Contact with and (suspected) exposure to other viral communicable diseases: Secondary | ICD-10-CM

## 2019-01-18 DIAGNOSIS — Z20822 Contact with and (suspected) exposure to covid-19: Secondary | ICD-10-CM

## 2019-01-18 LAB — COMPREHENSIVE METABOLIC PANEL
ALT: 37 IU/L — ABNORMAL HIGH (ref 0–32)
AST: 21 IU/L (ref 0–40)
Albumin/Globulin Ratio: 1.7 (ref 1.2–2.2)
Albumin: 5 g/dL — ABNORMAL HIGH (ref 3.8–4.8)
Alkaline Phosphatase: 173 IU/L — ABNORMAL HIGH (ref 39–117)
BUN/Creatinine Ratio: 20 (ref 9–23)
BUN: 12 mg/dL (ref 6–24)
Bilirubin Total: 0.3 mg/dL (ref 0.0–1.2)
CO2: 24 mmol/L (ref 20–29)
Calcium: 10 mg/dL (ref 8.7–10.2)
Chloride: 101 mmol/L (ref 96–106)
Creatinine, Ser: 0.61 mg/dL (ref 0.57–1.00)
GFR calc Af Amer: 128 mL/min/{1.73_m2} (ref >59)
GFR calc non Af Amer: 111 mL/min/{1.73_m2} (ref >59)
Globulin, Total: 2.9 g/dL (ref 1.5–4.5)
Glucose: 88 mg/dL (ref 65–99)
Potassium: 3.9 mmol/L (ref 3.5–5.2)
Sodium: 140 mmol/L (ref 134–144)
Total Protein: 7.9 g/dL (ref 6.0–8.5)

## 2019-01-18 LAB — LIPID PANEL
Chol/HDL Ratio: 5.2 ratio — ABNORMAL HIGH (ref 0.0–4.4)
Cholesterol, Total: 366 mg/dL — ABNORMAL HIGH (ref 100–199)
HDL: 70 mg/dL (ref >39)
LDL Chol Calc (NIH): 218 mg/dL — ABNORMAL HIGH (ref 0–99)
Triglycerides: 373 mg/dL — ABNORMAL HIGH (ref 0–149)
VLDL Cholesterol Cal: 78 mg/dL — ABNORMAL HIGH (ref 5–40)

## 2019-01-18 LAB — CA 125: Cancer Antigen (CA) 125: 22.3 U/mL (ref 0.0–38.1)

## 2019-01-21 ENCOUNTER — Telehealth: Payer: Self-pay

## 2019-01-21 LAB — NOVEL CORONAVIRUS, NAA: SARS-CoV-2, NAA: NOT DETECTED

## 2019-01-21 NOTE — Telephone Encounter (Signed)
Tried calling patient. No answer, left message for patient to call me back at [phone]

## 2019-01-21 NOTE — Telephone Encounter (Signed)
-----  Message from Megan Salon, MD sent at 01/20/2019 10:20 PM EST ----- Please let pt know her glucose was normal at 88.  I think she needs this for her health form.  However, one liver enzyme and alk phos (gall bladder tests were mildly elevated).  Would like to recheck these in 2-4 weeks.  Order placed for future orders.  Total cholesterol was 366 and LDLs were 218.  She was not fasting for this.  If she wants to repeat fasting, have her return in two weeks for this and the repeat liver tests.  She can put these current values on her health form or wait for the repeat labs.  Also, Ca-125 was 22.3.  This is normal.  She does need to gyn/oncology in 6 months.  I need her to please be honest with me.  If she is not going to go to this follow-up, then I need to see her in 6 months.

## 2019-01-22 LAB — NUSWAB VAGINITIS PLUS (VG+)
Candida albicans, NAA: NEGATIVE
Candida glabrata, NAA: NEGATIVE
Chlamydia trachomatis, NAA: NEGATIVE
Neisseria gonorrhoeae, NAA: NEGATIVE
Trich vag by NAA: NEGATIVE

## 2019-01-23 ENCOUNTER — Telehealth: Payer: Self-pay | Admitting: Family Medicine

## 2019-01-23 NOTE — Telephone Encounter (Signed)
Negative COVID results given. Patient results "NOT Detected." Caller expressed understanding. ° °

## 2019-01-24 NOTE — Telephone Encounter (Signed)
Results reviewed 01/21/19 with patient in detail as seen below by provider. Patient scheduled for lab appointment 02/04/19. Patient to call and schedule appointment with GYN/ONC in 6 months.

## 2019-02-04 ENCOUNTER — Other Ambulatory Visit (INDEPENDENT_AMBULATORY_CARE_PROVIDER_SITE_OTHER): Payer: PRIVATE HEALTH INSURANCE

## 2019-02-04 ENCOUNTER — Other Ambulatory Visit: Payer: Self-pay

## 2019-02-04 DIAGNOSIS — R7989 Other specified abnormal findings of blood chemistry: Secondary | ICD-10-CM

## 2019-02-05 LAB — COMPREHENSIVE METABOLIC PANEL
ALT: 28 IU/L (ref 0–32)
AST: 16 IU/L (ref 0–40)
Albumin/Globulin Ratio: 1.7 (ref 1.2–2.2)
Albumin: 4.5 g/dL (ref 3.8–4.8)
Alkaline Phosphatase: 141 IU/L — ABNORMAL HIGH (ref 39–117)
BUN/Creatinine Ratio: 18 (ref 9–23)
BUN: 11 mg/dL (ref 6–24)
Bilirubin Total: 0.2 mg/dL (ref 0.0–1.2)
CO2: 24 mmol/L (ref 20–29)
Calcium: 9.6 mg/dL (ref 8.7–10.2)
Chloride: 105 mmol/L (ref 96–106)
Creatinine, Ser: 0.61 mg/dL (ref 0.57–1.00)
GFR calc Af Amer: 128 mL/min/{1.73_m2} (ref >59)
GFR calc non Af Amer: 111 mL/min/{1.73_m2} (ref >59)
Globulin, Total: 2.7 g/dL (ref 1.5–4.5)
Glucose: 116 mg/dL — ABNORMAL HIGH (ref 65–99)
Potassium: 4 mmol/L (ref 3.5–5.2)
Sodium: 143 mmol/L (ref 134–144)
Total Protein: 7.2 g/dL (ref 6.0–8.5)

## 2019-02-05 LAB — LIPID PANEL
Chol/HDL Ratio: 6.6 ratio — ABNORMAL HIGH (ref 0.0–4.4)
Cholesterol, Total: 308 mg/dL — ABNORMAL HIGH (ref 100–199)
HDL: 47 mg/dL (ref >39)
LDL Chol Calc (NIH): 162 mg/dL — ABNORMAL HIGH (ref 0–99)
Triglycerides: 505 mg/dL — ABNORMAL HIGH (ref 0–149)
VLDL Cholesterol Cal: 99 mg/dL — ABNORMAL HIGH (ref 5–40)

## 2019-02-10 ENCOUNTER — Telehealth: Payer: Self-pay | Admitting: Obstetrics & Gynecology

## 2019-02-10 NOTE — Telephone Encounter (Signed)
Spoke to pt. Pt wanting fasting lab results from 02/04/19 and wanting an update from Nutrition referral, hasn't gotten a phone call. Pt has cancelled AEX with Dr Sabra Heck on 04/07/2019 and has OV with Dr Felecia Shelling on 07/03/2019. Labs seen from 02/04/19 not reviewed.  Will route to Dr Sabra Heck for lab results and nutrition referral.

## 2019-02-10 NOTE — Telephone Encounter (Signed)
Patient calling for results.

## 2019-02-25 NOTE — Telephone Encounter (Signed)
Please let pt know her cholesterol was 303.  Should be <200 and her triglycerides were >500.  She is at significant risk for diabetes with this.  Her glucose was mildly elevated at well.  I don't think these weren't fasting tests so would likely be better if repeated fasting.  Her alkaline phosphatase level was increase mildly but improved from prior testing.  This is a gall bladder test result.  Liver enzymes were normal.    She does need follow up with Dr. Raliegh Scarlet, her PCP.  Please call for an appt for her as she may not do it herself.  Also, let her know I did place the referral for [facility].  She can also call them directly to make an appt. This is Dr. Migdalia Dk office.  Please give her the number to call directly.  Thanks.

## 2019-02-25 NOTE — Telephone Encounter (Signed)
Spoke with pt. Pt given lab results. Pt states labs were fasting. Pt informed to see PCP for follow up on labs. Pt agreeable. This RN made televisit with Dr Raliegh Scarlet for 03/04/19 at [facility]. Pt aware and agreeable. Pt to call [facility] today for appt. Pt given direct number via mychart per pts request.  Referral placed by Dr Sabra Heck. Pt aware and thankful for all have done to help her. Merry Christmas!   Will route to Dr Sabra Heck for final review and will close encounter.

## 2019-02-26 ENCOUNTER — Encounter: Payer: Self-pay | Admitting: Emergency Medicine

## 2019-02-26 ENCOUNTER — Other Ambulatory Visit: Payer: Self-pay

## 2019-02-26 ENCOUNTER — Ambulatory Visit
Admission: EM | Admit: 2019-02-26 | Discharge: 2019-02-26 | Disposition: A | Discharge status: Home / Self Care | Payer: PRIVATE HEALTH INSURANCE | Attending: Emergency Medicine | Admitting: Emergency Medicine

## 2019-02-26 DIAGNOSIS — R059 Cough, unspecified: Secondary | ICD-10-CM

## 2019-02-26 DIAGNOSIS — R05 Cough: Secondary | ICD-10-CM

## 2019-02-26 DIAGNOSIS — Z20828 Contact with and (suspected) exposure to other viral communicable diseases: Secondary | ICD-10-CM

## 2019-02-26 NOTE — ED Provider Notes (Signed)
[facility]    CSN: [id number] Arrival date & time: 02/26/19  1717      History   Chief Complaint Chief Complaint  Patient presents with  . Weakness    HPI Angel Rodriguez is a 43 y.o. female with history of seizures, thrombocytopenia, cluster headaches presenting for 3-day course of generalized weakness, malaise, myalgias, subjective chills.  Patient reporting T-max of 100.5F.  Patient denies known Covid contacts.  States her son was sick last week, underwent Covid testing which was negative on Friday.  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  Patient endorsing mild, nonproductive, nonhemoptic cough.  Denies chest pain, shortness of breath, headache.  Patient has had good oral intake: Is without abdominal pain, vomiting, diarrhea.  She has been taking OTC cold medicines with adequate relief of symptoms.  Past Medical History:  Diagnosis Date  . Anxiety   . 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 Cluster headaches    started after seizure and migraines  . Complication of anesthesia    trouble with short term memory after surgeries  . Depression   . Fibroids    s/p TLH, bilateral salpingectomy  . GERD (gastroesophageal reflux disease)    patient thinks due to medications  . History of anemia    prior to hysterectomy  . History of pneumonia   . History of trichomoniasis   . Seizures ([place]) x 1 2-3 yrs ago none since   once saw neurologist, full body brain MRI and another 6 months lster  . Status post right oophorectomy   . Thrombocytopenia ([place])    sees [place] with Heme Onc    Patient Active Problem List   Diagnosis Date Noted  . GAD (generalized anxiety disorder) 09/03/2018  . Major depressive disorder, single episode, in partial remission ([place]) 06/06/2018  . Panic disorder 06/06/2018  . Flank pain 05/27/2018  . Acute left-sided low back pain without sciatica 05/27/2018  . Mental confusion/  slurred speech 05/09/2018  . High risk medications (not anticoagulants) long-term use 05/09/2018  . Numbness 03/18/2018  . Vitamin D deficiency 03/18/2018  . Thrombocytopenia ([place]) 03/14/2018  . Dysarthria 03/14/2018  . Brain lesion 03/14/2018  . Right sided weakness 03/13/2018  . 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 Abnormal MRI of head 01/21/2016  . Vascular headache 09/15/2015  . Insomnia 09/15/2015  . Headache 07/08/2015  . Headache, chronic migraine without aura, intractable 07/08/2015  . Neck pain 06/24/2015  . Seizure ([place]) 06/24/2015  . Central retinal vein occlusion 12/11/2011  . Cotton wool exudates 12/11/2011  . Cystoid macular edema 12/11/2011    Past Surgical History:  Procedure Laterality Date  . CYSTOSCOPY N/A 07/31/2017   Procedure: CYSTOSCOPY;  Surgeon: Megan Salon, MD;  Location: [facility];  Service: Gynecology;  Laterality: N/A;  . ENDOMETRIAL ABLATION    . EYE SURGERY     laser - removed blood from right eye  . HYSTEROSCOPY WITH D & C    . LAPAROSCOPY N/A 09/19/2017   Procedure: LAPAROSCOPY DIAGNOSTIC WITH RIGHT OOPHERECTOMY, LYSIS OF ADHESIONS;  Surgeon: Salvadore Dom, MD;  Location: [facility];  Service: Gynecology;  Laterality: N/A;  . OMENTECTOMY N/A 11/21/2017   Procedure: OMENTECTOMY AND PELVIC WASHINGS;  Surgeon: Isabel Caprice, MD;  Location: [facility];  Service: Gynecology;  Laterality: N/A;  . ROBOTIC ASSISTED SALPINGO OOPHERECTOMY Left 11/21/2017   Procedure: XI ROBOTIC ASSISTED LEFT  SALPINGO OOPHORECTOMY;  Surgeon: Isabel Caprice, MD;  Location: [facility];  Service: Gynecology;  Laterality: Left;  . TOTAL LAPAROSCOPIC HYSTERECTOMY WITH SALPINGECTOMY Bilateral 07/31/2017   Procedure: TOTAL LAPAROSCOPIC HYSTERECTOMY WITH SALPINGECTOMY;  Surgeon: Megan Salon, MD;  Location: [facility];  Service: Gynecology;  Laterality: Bilateral;  20 week size uterus/ Alexis bag in room/ need 4.5 hours  . TUBAL LIGATION    . WISDOM TOOTH EXTRACTION       OB History    Gravida  5   Para  3   Term  3   Preterm  0   AB  2   Living  3     SAB  1   TAB      Ectopic  1   Multiple      Live Births               Home Medications    Prior to Admission medications   Medication Sig Start Date End Date Taking? Authorizing Provider  acetaminophen (TYLENOL) 500 MG tablet Take 500 mg by mouth every 6 (six) hours as needed for headache (pain).    [provider]  acetaminophen-codeine (TYLENOL #3) 300-30 MG tablet One to two prn headache.  No mor ethan 4/day 01/01/19   Sater, Nanine Means, MD  atropine 1 % ophthalmic solution Place 1 drop into the right eye 3 (three) times daily.  01/10/16   [provider]  brimonidine (ALPHAGAN P) 0.1 % SOLN Place 1 drop into the right eye 3 (three) times daily.  06/21/15   [provider]  calcium carbonate (TUMS EX) 750 MG chewable tablet Chew 2 tablets by mouth daily as needed for heartburn.     [provider]  clonazePAM (KLONOPIN) 0.5 MG tablet Take 1 tablet (0.5 mg total) by mouth 2 (two) times daily as needed for anxiety. 01/07/19 04/07/19  Pucilowski, Marchia Bond, MD  Cyanocobalamin (VITAMIN B-12 PO) Take 1 tablet by mouth daily.     [provider]  dorzolamide-timolol (COSOPT) 22.3-6.8 MG/ML ophthalmic solution Place 1 drop into the right eye 2 (two) times daily.  06/21/15   [provider]  fluticasone (FLONASE) 50 MCG/ACT nasal spray Place 1-2 sprays into both nostrils daily as needed for allergies.     [provider]  Lacosamide 100 MG TABS Take 100 mg by mouth 2 (two) times a day. 08/14/18 08/14/19  [provider]  latanoprost (XALATAN) 0.005 % ophthalmic solution Place 1 drop into both eyes at bedtime.    [provider]  levETIRAcetam (KEPPRA) 500 MG tablet Take by mouth as needed. 07/06/18   [provider]  Multiple Vitamin (MULTIVITAMIN WITH MINERALS) TABS tablet Take 1 tablet by mouth daily.     [provider]  nystatin cream (MYCOSTATIN) Apply 1 application topically 2 (two) times daily. Apply to affected area BID for up to 7 days. 01/17/19   Megan Salon, MD  SUMAtriptan (IMITREX) 100 MG tablet Take 1 tablet (100 mg total) by mouth once as needed for migraine. May repeat in 2 hours if headache persists or recurs. 01/01/19   Sater, Nanine Means, MD  verapamil (CALAN-SR) 240 MG CR tablet Take 1 tablet (240 mg total) by mouth at bedtime. 01/01/19   Sater, Nanine Means, MD  Vitamin D, Ergocalciferol, (DRISDOL) 1.25 MG (50000 UT) CAPS capsule Take 1 capsule (50,000 Units total) by mouth every 7 (seven) days. Patient taking differently: Take 50,000 Units by mouth every Saturday.  03/21/18   Sater, Nanine Means, MD  vortioxetine HBr (TRINTELLIX) 20 MG TABS tablet Take 1 tablet (20 mg total) by mouth daily. 01/07/19 04/07/19  Pucilowski, Marchia Bond, MD    Family History Family History  Problem Relation Age of Onset  . Alcoholism Father   . Cirrhosis Father   . Alcohol abuse Father   . Cancer Maternal Grandmother        unsure of type; died of other causes  . Heart disease Maternal Aunt   . Drug abuse Cousin     Social History Social History   Tobacco Use  . Smoking status: Former Smoker    Years: 20.00    Types: Cigarettes, E-cigarettes    Quit date: 05/05/2018    Years since quitting: 0.8  . Smokeless tobacco: Never Used  Substance Use Topics  . Alcohol use: Not Currently  . Drug use: No     Allergies   Ivp dye [iodinated diagnostic agents] and Bee venom   Review of Systems Review of Systems  Constitutional: Positive for fatigue. Negative for activity change, appetite change and fever.  HENT: Negative for ear pain, sinus pain, sore throat and voice change.   Eyes: Negative for pain, redness and visual disturbance.  Respiratory: Negative for cough and shortness of breath.   Cardiovascular: Negative for chest pain and palpitations.  Gastrointestinal: Negative for  abdominal pain, anal bleeding, blood in stool, diarrhea, rectal pain and vomiting.  Musculoskeletal: Positive for myalgias. Negative for arthralgias, back pain, gait problem, neck pain and neck stiffness.  Skin: Negative for rash and wound.  Neurological: Negative for dizziness, syncope, facial asymmetry and headaches.     Physical Exam Triage Vital Signs ED Triage Vitals  Enc Vitals Group     BP 02/26/19 1728 118/76     Pulse Rate 02/26/19 1728 92     Resp 02/26/19 1728 18     Temp 02/26/19 1728 99 F (37.2 C)     Temp Source 02/26/19 1728 Temporal     SpO2 02/26/19 1728 94 %     Weight --      Height --      Head Circumference --      Peak Flow --      Pain Score 02/26/19 1730 0     Pain Loc --      Pain Edu? --      Excl. in [place]? --    No data found.  Updated Vital Signs BP 118/76 (BP Location: Left Arm)   Pulse 92   Temp 99 F (37.2 C) (Temporal)   Resp 18   LMP 05/02/2017   SpO2 94%   Visual Acuity Right Eye Distance:   Left Eye Distance:   Bilateral Distance:    Right Eye Near:   Left Eye Near:    Bilateral Near:     Physical Exam Constitutional:      General: She is not in acute distress.    Appearance: She is obese. She is not ill-appearing.  HENT:     Head: Normocephalic and atraumatic.     Mouth/Throat:     Mouth: Mucous membranes are moist.     Pharynx: Oropharynx is clear.  Eyes:     General: No scleral icterus.    Pupils: Pupils are equal, round, and reactive to light.  Cardiovascular:     Rate and Rhythm: Normal rate and regular rhythm.     Heart sounds: No murmur. No gallop.   Pulmonary:  Effort: Pulmonary effort is normal. No respiratory distress.     Breath sounds: No stridor. No wheezing, rhonchi or rales.     Comments: Good air entry bilaterally Skin:    Capillary Refill: Capillary refill takes less than 2 seconds.     Coloration: Skin is not jaundiced or pale.  Neurological:     General: No focal deficit present.     Mental  Status: She is alert and oriented to person, place, and time.      UC Treatments / Results  Labs (all labs ordered are listed, but only abnormal results are displayed) Labs Reviewed  NOVEL CORONAVIRUS, NAA    EKG   Radiology No results found.  Procedures Procedures (including critical care time)  Medications Ordered in UC Medications - No data to display  Initial Impression / Assessment and Plan / UC Course  I have reviewed the triage vital signs and the nursing notes.  Pertinent labs & imaging results that were available during my care of the patient were reviewed by me and considered in my medical decision making (see chart for details).     Patient afebrile, nontoxic, with SpO2 94%.  Covid PCR pending.  Patient to quarantine until results are back.  We will continue supportive management.  Return precautions discussed, patient verbalized understanding and is agreeable to plan. Final Clinical Impressions(s) / UC Diagnoses   Final diagnoses:  Cough     Discharge Instructions     Your COVID test is pending - it is important to quarantine / isolate at home until your results are back. If you test positive and would like further evaluation for persistent or worsening symptoms, you may schedule an E-visit or virtual (video) visit throughout the [facility] app or website.  PLEASE NOTE: If you develop severe chest pain or shortness of breath please go to the ER or call 9-1-1 for further evaluation --> DO NOT schedule electronic or virtual visits for this. Please call our office for further guidance / recommendations as needed.    ED Prescriptions    None     PDMP not reviewed this encounter.   Neldon Mc [place], [place] 02/27/19 [phone]

## 2019-02-26 NOTE — ED Triage Notes (Addendum)
Pt presents to [facility] for 3 days of generalized weakness.   C/o she has difficulty holding her head up.  Pt c/o cough.  Pt c/o chills.  C/o temp of 100.0 at home.  C/o mild decrease in appetite, but is still eating normally.

## 2019-02-26 NOTE — Discharge Instructions (Signed)
Your COVID test is pending - it is important to quarantine / isolate at home until your results are back. °If you test positive and would like further evaluation for persistent or worsening symptoms, you may schedule an E-visit or virtual (video) visit throughout the [facility] MyChart app or website. ° °PLEASE NOTE: If you develop severe chest pain or shortness of breath please go to the ER or call 9-1-1 for further evaluation --> DO NOT schedule electronic or virtual visits for this. °Please call our office for further guidance / recommendations as needed. °

## 2019-02-28 LAB — NOVEL CORONAVIRUS, NAA: SARS-CoV-2, NAA: DETECTED — AB

## 2019-03-01 ENCOUNTER — Telehealth: Payer: Self-pay | Admitting: Unknown Physician Specialty

## 2019-03-01 NOTE — Telephone Encounter (Signed)
Called to discuss with patient about Covid symptoms and the use of bamlanivimab, a monoclonal antibody infusion for those with mild to moderate Covid symptoms and at a high risk of hospitalization.  Pt is qualified for this infusion at the [facility] infusion center due to [employer] left to call back

## 2019-03-03 ENCOUNTER — Telehealth: Payer: Self-pay | Admitting: Emergency Medicine

## 2019-03-03 NOTE — Telephone Encounter (Signed)
Your test for COVID-19 was positive, meaning that you were infected with the novel coronavirus and could give the germ to others.  Please continue isolation at home for at least 10 days since the start of your symptoms. If you do not have symptoms, please isolate at home for 10 days from the day you were tested. Once you complete your 10 day quarantine, you may return to normal activities as long as you've not had a fever for over 24 hours(without taking fever reducing medicine) and your symptoms are improving. Please continue good preventive care measures, including:  frequent hand-washing, avoid touching your face, cover coughs/sneezes, stay out of crowds and keep a 6 foot distance from others.  Go to the nearest hospital emergency room if fever/cough/breathlessness are severe or illness seems like a threat to life.  Attempted to reach patient. No answer at this time. Voicemail left.   If you have any questions, you may call me at [phone]    

## 2019-03-04 ENCOUNTER — Ambulatory Visit: Payer: PRIVATE HEALTH INSURANCE | Admitting: Family Medicine

## 2019-03-05 ENCOUNTER — Telehealth: Payer: Self-pay | Admitting: Emergency Medicine

## 2019-03-05 ENCOUNTER — Telehealth: Payer: Self-pay | Admitting: Family Medicine

## 2019-03-05 NOTE — Telephone Encounter (Signed)
Patient contacted by phone and made aware of   positive covid results. Pt verbalized understanding and had all questions answered.

## 2019-03-06 ENCOUNTER — Ambulatory Visit (INDEPENDENT_AMBULATORY_CARE_PROVIDER_SITE_OTHER): Payer: PRIVATE HEALTH INSURANCE | Admitting: Adult Health

## 2019-03-06 ENCOUNTER — Encounter: Payer: Self-pay | Admitting: Adult Health

## 2019-03-06 ENCOUNTER — Other Ambulatory Visit: Payer: Self-pay

## 2019-03-06 DIAGNOSIS — U071 COVID-19: Secondary | ICD-10-CM | POA: Diagnosis not present

## 2019-03-06 NOTE — Progress Notes (Signed)
.Virtual Visit via Telephone Note  I connected with Angel Rodriguez on 03/06/19 at 11:00 AM EST by telephone and verified that I am speaking with the correct person using two identifiers.  Location: Patient: Home Provider: In Clinic   I discussed the limitations, risks, security and privacy concerns of performing an evaluation and management service by telephone and the availability of in person appointments. I also discussed with the patient that there may be a patient responsible charge related to this service. The patient expressed understanding and agreed to proceed.   History of Present Illness: [name] calls in with complaints recent mild fever (highest 169f oral-did check ), diarrhea, body aches, lost sense of taste, occasional non-productive cough, poor appetite- sx's for 9 days. She reports sx's are reducing daily- currently only experiencing loose stools- 4-5/day. She denies abdominal pain, hematochezia/hematuria. She has been pushing water/gatorade. She denies N/V, nasal drainage/ shortness of breath. She denies loss sense of smell. SARS-CoV-S- Positive 02/26/2019- quarantine until 03/08/2019. She has been distancing from family as directed. She requests "an ABX for something". Explained that Corona Virus is a virus and ABX are not indicated for tx. Supportive care discussed and Red Flag sx's reviewed and if any develop- she is to seek immediate medical care- she verbalized understanding/agreement. Patient Care Team    Relationship Specialty Notifications Start End  Mellody Dance, DO PCP - General Family Medicine  05/09/18   Earlie Raveling, NP Nurse Practitioner Nurse Practitioner  05/09/18     Patient Active Problem List   Diagnosis Date Noted  . GAD (generalized anxiety disorder) 09/03/2018  . Major depressive disorder, single episode, in partial remission ([place]) 06/06/2018  . Panic disorder 06/06/2018  . Flank pain 05/27/2018  . Acute left-sided low back pain without  sciatica 05/27/2018  . Mental confusion/ slurred speech 05/09/2018  . High risk medications (not anticoagulants) long-term use 05/09/2018  . Numbness 03/18/2018  . Vitamin D deficiency 03/18/2018  . Thrombocytopenia ([place]) 03/14/2018  . Dysarthria 03/14/2018  . Brain lesion 03/14/2018  . Right sided weakness 03/13/2018  . 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 Abnormal MRI of head 01/21/2016  . Vascular headache 09/15/2015  . Insomnia 09/15/2015  . Headache 07/08/2015  . Headache, chronic migraine without aura, intractable 07/08/2015  . Neck pain 06/24/2015  . Seizure ([place]) 06/24/2015  . Central retinal vein occlusion 12/11/2011  . Cotton wool exudates 12/11/2011  . Cystoid macular edema 12/11/2011     Past Medical History:  Diagnosis Date  . Anxiety   . 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 Cluster headaches    started after seizure and migraines  . Complication of anesthesia    trouble with short term memory after surgeries  . COVID-19   . Depression   . Fibroids    s/p TLH, bilateral salpingectomy  . GERD (gastroesophageal reflux disease)    patient thinks due to medications  . History of anemia    prior to hysterectomy  . History of pneumonia   . History of trichomoniasis   . Seizures ([place]) x 1 2-3 yrs ago none since   once saw neurologist, full body brain MRI and another 6 months lster  . Status post right oophorectomy   . Thrombocytopenia ([place])    sees [place] with Heme Onc     Past Surgical History:  Procedure Laterality Date  . CYSTOSCOPY N/A 07/31/2017   Procedure: CYSTOSCOPY;  Surgeon: Megan Salon, MD;  Location:  [facility];  Service: Gynecology;  Laterality: N/A;  . ENDOMETRIAL ABLATION    . EYE SURGERY     laser - removed blood from right eye  . HYSTEROSCOPY WITH D & C    . LAPAROSCOPY N/A 09/19/2017   Procedure: LAPAROSCOPY DIAGNOSTIC WITH RIGHT OOPHERECTOMY, LYSIS OF ADHESIONS;  Surgeon: Salvadore Dom, MD;  Location: [facility];  Service: Gynecology;  Laterality: N/A;  . OMENTECTOMY N/A 11/21/2017   Procedure: OMENTECTOMY AND PELVIC WASHINGS;  Surgeon: Isabel Caprice, MD;  Location: [facility];  Service: Gynecology;  Laterality: N/A;  . ROBOTIC ASSISTED SALPINGO OOPHERECTOMY Left 11/21/2017   Procedure: XI ROBOTIC ASSISTED LEFT SALPINGO OOPHORECTOMY;  Surgeon: Isabel Caprice, MD;  Location: [facility];  Service: Gynecology;  Laterality: Left;  . TOTAL LAPAROSCOPIC HYSTERECTOMY WITH SALPINGECTOMY Bilateral 07/31/2017   Procedure: TOTAL LAPAROSCOPIC HYSTERECTOMY WITH SALPINGECTOMY;  Surgeon: Megan Salon, MD;  Location: [facility];  Service: Gynecology;  Laterality: Bilateral;  20 week size uterus/ Alexis bag in room/ need 4.5 hours  . TUBAL LIGATION    . WISDOM TOOTH EXTRACTION       Family History  Problem Relation Age of Onset  . Alcoholism Father   . Cirrhosis Father   . Alcohol abuse Father   . Cancer Maternal Grandmother        unsure of type; died of other causes  . Heart disease Maternal Aunt   . Drug abuse Cousin      Social History   Substance and Sexual Activity  Drug Use No     Social History   Substance and Sexual Activity  Alcohol Use Not Currently     Social History   Tobacco Use  Smoking Status Former Smoker  . Years: 20.00  . Types: Cigarettes, E-cigarettes  . Quit date: 05/05/2018  . Years since quitting: 0.8  Smokeless Tobacco Never Used     Outpatient Encounter Medications as of 03/06/2019  Medication Sig  . acetaminophen (TYLENOL) 500 MG tablet Take 500 mg by mouth every 6 (six) hours as needed for headache (pain).  [name] acetaminophen-codeine (TYLENOL #3) 300-30 MG tablet One to two prn headache.  No mor ethan 4/day  . Ascorbic Acid (VITAMIN C) 1000 MG tablet Take 1,000 mg by mouth daily.  [name] atropine 1 % ophthalmic solution Place 1 drop into the right eye 3 (three) times daily.   . brimonidine (ALPHAGAN P) 0.1 % SOLN Place 1 drop into the right eye 3 (three) times daily.   .  calcium carbonate (TUMS EX) 750 MG chewable tablet Chew 2 tablets by mouth daily as needed for heartburn.   . clonazePAM (KLONOPIN) 0.5 MG tablet Take 1 tablet (0.5 mg total) by mouth 2 (two) times daily as needed for anxiety.  . dorzolamide-timolol (COSOPT) 22.3-6.8 MG/ML ophthalmic solution Place 1 drop into the right eye 2 (two) times daily.   . fluticasone (FLONASE) 50 MCG/ACT nasal spray Place 1-2 sprays into both nostrils daily as needed for allergies.   . Lacosamide 100 MG TABS Take 100 mg by mouth 2 (two) times a day.  . latanoprost (XALATAN) 0.005 % ophthalmic solution Place 1 drop into both eyes at bedtime.  . levETIRAcetam (KEPPRA) 500 MG tablet Take by mouth as needed.  . nystatin cream (MYCOSTATIN) Apply 1 application topically 2 (two) times daily. Apply to affected area BID for up to 7 days.  . SUMAtriptan (IMITREX) 100 MG tablet Take 1 tablet (100 mg total) by mouth once as needed for  migraine. May repeat in 2 hours if headache persists or recurs.  . verapamil (CALAN-SR) 240 MG CR tablet Take 1 tablet (240 mg total) by mouth at bedtime.  [name] VITAMIN D PO Take 500 mg by mouth daily.  . Vitamin D, Ergocalciferol, (DRISDOL) 1.25 MG (50000 UT) CAPS capsule Take 1 capsule (50,000 Units total) by mouth every 7 (seven) days. (Patient taking differently: Take 50,000 Units by mouth every Saturday. )  . vortioxetine HBr (TRINTELLIX) 20 MG TABS tablet Take 1 tablet (20 mg total) by mouth daily.  . Zinc 50 MG CAPS Take 1 capsule by mouth daily.  . [DISCONTINUED] Cyanocobalamin (VITAMIN B-12 PO) Take 1 tablet by mouth daily.   . [DISCONTINUED] Multiple Vitamin (MULTIVITAMIN WITH MINERALS) TABS tablet Take 1 tablet by mouth daily.   No facility-administered encounter medications on file as of 03/06/2019.    Allergies: Ivp dye [iodinated diagnostic agents] and Bee venom  There is no height or weight on file to calculate BMI.  Last menstrual period 05/02/2017.    Observations/Objective: No  acute distress noted during the telephone conversation.   Assessment and Plan: OTC Imodium for diarrhea tx Push fluids, rest Supportive care discussed and Red Flag sx's reviewed and if any develop- she is to seek immediate medical care- she verbalized understanding/agreement. Reviewed CDC guidelines for quarantining-  10 days from sx onset + 24 hrs sx free (ie: GI upset stops). Continue to distance yourself from your family until that time.  Follow Up Instructions: PRN   I discussed the assessment and treatment plan with the patient. The patient was provided an opportunity to ask questions and all were answered. The patient agreed with the plan and demonstrated an understanding of the instructions.   The patient was advised to call back or seek an in-person evaluation if the symptoms worsen or if the condition fails to improve as anticipated.  I provided 12 minutes of non-face-to-face time during this encounter.   Esaw Grandchild, NP

## 2019-03-06 NOTE — Assessment & Plan Note (Signed)
Assessment and Plan: OTC Imodium for diarrhea tx Push fluids, rest Supportive care discussed and Red Flag sx's reviewed and if any develop- she is to seek immediate medical care- she verbalized understanding/agreement. Reviewed CDC guidelines for quarantining-  10 days from sx onset + 24 hrs sx free (ie: GI upset stops). Continue to distance yourself from your family until that time.  Follow Up Instructions: PRN   I discussed the assessment and treatment plan with the patient. The patient was provided an opportunity to ask questions and all were answered. The patient agreed with the plan and demonstrated an understanding of the instructions.   The patient was advised to call back or seek an in-person evaluation if the symptoms worsen or if the condition fails to improve as anticipated.

## 2019-04-07 ENCOUNTER — Ambulatory Visit: Payer: PRIVATE HEALTH INSURANCE | Admitting: Obstetrics & Gynecology

## 2019-04-09 ENCOUNTER — Ambulatory Visit (INDEPENDENT_AMBULATORY_CARE_PROVIDER_SITE_OTHER): Payer: No Typology Code available for payment source | Admitting: Psychiatry

## 2019-04-09 ENCOUNTER — Other Ambulatory Visit: Payer: Self-pay

## 2019-04-09 DIAGNOSIS — F324 Major depressive disorder, single episode, in partial remission: Secondary | ICD-10-CM | POA: Diagnosis not present

## 2019-04-09 DIAGNOSIS — F411 Generalized anxiety disorder: Secondary | ICD-10-CM | POA: Diagnosis not present

## 2019-04-09 DIAGNOSIS — F410 Panic disorder [episodic paroxysmal anxiety] without agoraphobia: Secondary | ICD-10-CM

## 2019-04-09 MED ORDER — CLONAZEPAM 0.5 MG PO TABS: 0.5000 mg | ORAL_TABLET | Freq: Two times a day (BID) | ORAL | 1 refills | Status: DC | PRN

## 2019-04-09 MED ORDER — VORTIOXETINE HBR 20 MG PO TABS: 20.0000 mg | ORAL_TABLET | Freq: Every day | ORAL | 5 refills | Status: DC
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
## 2019-05-23 ENCOUNTER — Encounter: Payer: Self-pay | Admitting: Certified Nurse Midwife

## 2019-06-03 ENCOUNTER — Ambulatory Visit
Admission: EM | Admit: 2019-06-03 | Discharge: 2019-06-03 | Disposition: A | Discharge status: Home / Self Care | Payer: No Typology Code available for payment source | Attending: Physician Assistant | Admitting: Physician Assistant

## 2019-06-03 DIAGNOSIS — L600 Ingrowing nail: Secondary | ICD-10-CM | POA: Diagnosis not present

## 2019-06-03 NOTE — ED Triage Notes (Signed)
Pt states jammed her lt big toe almost 3wks ago. States pain is getting worse and having swelling around toe.

## 2019-06-03 NOTE — ED Provider Notes (Signed)
[facility]    CSN: [id number] Arrival date & time: 06/03/19  1913      History   Chief Complaint Chief Complaint  Patient presents with  . Toe Pain    HPI Angel Rodriguez is a 44 y.o. female.   44 year old female comes in for 3-week history of left great toe pain after injury.  States she jammed her toe by accident, and now area has been getting more painful with swelling.  Denies numbness, tingling.  Denies spreading erythema, warmth, fever. Has been dressing with neosporin.      Past Medical History:  Diagnosis Date  . Anxiety   . 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 Cluster headaches    started after seizure and migraines  . Complication of anesthesia    trouble with short term memory after surgeries  . COVID-19   . Depression   . Fibroids    s/p TLH, bilateral salpingectomy  . GERD (gastroesophageal reflux disease)    patient thinks due to medications  . History of anemia    prior to hysterectomy  . History of pneumonia   . History of trichomoniasis   . Seizures ([place]) x 1 2-3 yrs ago none since   once saw neurologist, full body brain MRI and another 6 months lster  . Status post right oophorectomy   . Thrombocytopenia ([place])    sees [place] with Heme Onc    Patient Active Problem List   Diagnosis Date Noted  . COVID-19 virus infection 03/06/2019  . GAD (generalized anxiety disorder) 09/03/2018  . Major depressive disorder, single episode, in partial remission ([place]) 06/06/2018  . Panic disorder 06/06/2018  . Flank pain 05/27/2018  . Acute left-sided low back pain without sciatica 05/27/2018  . Mental confusion/ slurred speech 05/09/2018  . High risk medications (not anticoagulants) long-term use 05/09/2018  . Numbness 03/18/2018  . Vitamin D deficiency 03/18/2018  . Thrombocytopenia ([place]) 03/14/2018  . Dysarthria 03/14/2018  . Brain lesion 03/14/2018  . Right sided weakness 03/13/2018  . 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 Abnormal MRI of head 01/21/2016  . Vascular headache 09/15/2015  . Insomnia 09/15/2015  . Headache 07/08/2015  . Headache, chronic migraine without aura, intractable 07/08/2015  . Neck pain 06/24/2015  . Seizure ([name]) 06/24/2015  . Central retinal vein occlusion 12/11/2011  . Cotton wool exudates 12/11/2011  . Cystoid macular edema 12/11/2011    Past Surgical History:  Procedure Laterality Date  . CYSTOSCOPY N/A 07/31/2017   Procedure: CYSTOSCOPY;  Surgeon: Megan Salon, MD;  Location: [facility];  Service: Gynecology;  Laterality: N/A;  . ENDOMETRIAL ABLATION    . EYE SURGERY     laser - removed blood from right eye  . HYSTEROSCOPY WITH D & C    . LAPAROSCOPY N/A 09/19/2017   Procedure: LAPAROSCOPY DIAGNOSTIC WITH RIGHT OOPHERECTOMY, LYSIS OF ADHESIONS;  Surgeon: Salvadore Dom, MD;  Location: [facility];  Service: Gynecology;  Laterality: N/A;  . OMENTECTOMY N/A 11/21/2017   Procedure: OMENTECTOMY AND PELVIC WASHINGS;  Surgeon: Isabel Caprice, MD;  Location: [facility];  Service: Gynecology;  Laterality: N/A;  . ROBOTIC ASSISTED SALPINGO OOPHERECTOMY Left 11/21/2017   Procedure: XI ROBOTIC ASSISTED LEFT SALPINGO OOPHORECTOMY;  Surgeon: Isabel Caprice, MD;  Location: [facility];  Service: Gynecology;  Laterality: Left;  . TOTAL LAPAROSCOPIC HYSTERECTOMY WITH SALPINGECTOMY Bilateral 07/31/2017   Procedure: TOTAL LAPAROSCOPIC HYSTERECTOMY WITH SALPINGECTOMY;  Surgeon: Megan Salon, MD;  Location: [facility];  Service: Gynecology;  Laterality: Bilateral;  20 week size uterus/ Alexis bag in room/ need 4.5 hours  . TUBAL LIGATION    . WISDOM TOOTH EXTRACTION      OB History    Gravida  5   Para  3   Term  3   Preterm  0   AB  2   Living  3     SAB  1   TAB      Ectopic  1   Multiple      Live Births               Home Medications    Prior to Admission medications   Medication Sig Start Date End Date Taking? Authorizing Provider  acetaminophen  (TYLENOL) 500 MG tablet Take 500 mg by mouth every 6 (six) hours as needed for headache (pain).    [provider]  acetaminophen-codeine (TYLENOL #3) 300-30 MG tablet One to two prn headache.  No mor ethan 4/day 01/01/19   Sater, Nanine Means, MD  Ascorbic Acid (VITAMIN C) 1000 MG tablet Take 1,000 mg by mouth daily.    [provider]  atropine 1 % ophthalmic solution Place 1 drop into the right eye 3 (three) times daily.  01/10/16   [provider]  brimonidine (ALPHAGAN P) 0.1 % SOLN Place 1 drop into the right eye 3 (three) times daily.  06/21/15   [provider]  calcium carbonate (TUMS EX) 750 MG chewable tablet Chew 2 tablets by mouth daily as needed for heartburn.     [provider]  clonazePAM (KLONOPIN) 0.5 MG tablet Take 1 tablet (0.5 mg total) by mouth 2 (two) times daily as needed for anxiety. 04/09/19 10/06/19  Pucilowski, Marchia Bond, MD  dorzolamide-timolol (COSOPT) 22.3-6.8 MG/ML ophthalmic solution Place 1 drop into the right eye 2 (two) times daily.  06/21/15   [provider]  fluticasone (FLONASE) 50 MCG/ACT nasal spray Place 1-2 sprays into both nostrils daily as needed for allergies.     [provider]  Lacosamide 100 MG TABS Take 100 mg by mouth 2 (two) times a day. 08/14/18 08/14/19  [provider]  latanoprost (XALATAN) 0.005 % ophthalmic solution Place 1 drop into both eyes at bedtime.    [provider]  levETIRAcetam (KEPPRA) 500 MG tablet Take by mouth as needed. 07/06/18   [provider]  nystatin cream (MYCOSTATIN) Apply 1 application topically 2 (two) times daily. Apply to affected area BID for up to 7 days. 01/17/19   Megan Salon, MD  SUMAtriptan (IMITREX) 100 MG tablet Take 1 tablet (100 mg total) by mouth once as needed for migraine. May repeat in 2 hours if headache persists or recurs. 01/01/19   Sater, Nanine Means, MD  verapamil (CALAN-SR) 240 MG CR tablet Take 1 tablet (240 mg total)  by mouth at bedtime. 01/01/19   Sater, Nanine Means, MD  VITAMIN D PO Take 500 mg by mouth daily.    [provider]  Vitamin D, Ergocalciferol, (DRISDOL) 1.25 MG (50000 UT) CAPS capsule Take 1 capsule (50,000 Units total) by mouth every 7 (seven) days. Patient taking differently: Take 50,000 Units by mouth every Saturday.  03/21/18   Sater, Nanine Means, MD  vortioxetine HBr (TRINTELLIX) 20 MG TABS tablet Take 1 tablet (20 mg total) by mouth daily. 04/09/19 10/06/19  Pucilowski, Marchia Bond, MD  Zinc 50 MG CAPS Take 1 capsule by mouth daily.  [provider]    Family History Family History  Problem Relation Age of Onset  . Alcoholism Father   . Cirrhosis Father   . Alcohol abuse Father   . Cancer Maternal Grandmother        unsure of type; died of other causes  . Heart disease Maternal Aunt   . Drug abuse Cousin     Social History Social History   Tobacco Use  . Smoking status: Former Smoker    Years: 20.00    Types: Cigarettes, E-cigarettes    Quit date: 05/05/2018    Years since quitting: 1.0  . Smokeless tobacco: Never Used  Substance Use Topics  . Alcohol use: Not Currently  . Drug use: No     Allergies   Ivp dye [iodinated diagnostic agents] and Bee venom   Review of Systems Review of Systems  Reason unable to perform ROS: See HPI as above.     Physical Exam Triage Vital Signs ED Triage Vitals [06/03/19 1926]  Enc Vitals Group     BP 125/81     Pulse Rate 71     Resp 16     Temp 98 F (36.7 C)     Temp Source Oral     SpO2 96 %     Weight      Height      Head Circumference      Peak Flow      Pain Score      Pain Loc      Pain Edu?      Excl. in [place]?    No data found.  Updated Vital Signs BP 125/81 (BP Location: Left Arm)   Pulse 71   Temp 98 F (36.7 C) (Oral)   Resp 16   LMP 05/02/2017   SpO2 96%   Physical Exam Constitutional:      General: She is not in acute distress.    Appearance: Normal appearance. She is  well-developed. She is not toxic-appearing or diaphoretic.  HENT:     Head: Normocephalic and atraumatic.  Eyes:     Conjunctiva/sclera: Conjunctivae normal.     Pupils: Pupils are equal, round, and reactive to light.  Pulmonary:     Effort: Pulmonary effort is normal. No respiratory distress.     Comments: Speaking in full sentences without difficulty Musculoskeletal:     Cervical back: Normal range of motion and neck supple.     Comments: Medial ingrown toenail with skin overgrowth. No cellulitis/paronychia.  Tenderness to palpation of skin overgrowth, and surrounding nailbed.  No obvious bony tenderness.  Full range of motion of toe.  NVI  Skin:    General: Skin is warm and dry.  Neurological:     Mental Status: She is alert and oriented to person, place, and time.      UC Treatments / Results  Labs (all labs ordered are listed, but only abnormal results are displayed) Labs Reviewed - No data to display  EKG   Radiology No results found.  Procedures Nail Removal  Date/Time: 06/03/2019 8:25 PM Performed by: Ok Edwards, PA-C Authorized by: Ok Edwards, PA-C   Consent:    Consent obtained:  Verbal   Consent given by:  Patient   Risks discussed:  Bleeding, incomplete removal, infection, pain and permanent nail deformity   Alternatives discussed:  Alternative treatment and referral Location:    Foot:  L big toe Pre-procedure details:    Skin preparation:  ChloraPrep and  Hibiclens   Preparation: Patient was prepped and draped in the usual sterile fashion   Anesthesia (see MAR for exact dosages):    Anesthesia method:  Nerve block   Block location:  Left great toe   Block needle gauge:  27 G   Block anesthetic:  Bupivacaine 0.5% w/o epi and lidocaine 1% w/o epi   Block injection procedure:  Anatomic landmarks identified, introduced needle, incremental injection, anatomic landmarks palpated and negative aspiration for blood   Block outcome:  Anesthesia achieved Nail  Removal:    Nail removed:  Partial   Nail side:  Medial   Nail bed repaired: no     Removed nail replaced and anchored: no   Trephination:    Subungual hematoma drained: no   Ingrown nail:    Wedge excision of skin: yes     Nail matrix removed or ablated:  Partial Nails trimmed:    Number of nails trimmed:  1 Post-procedure details:    Dressing:  Antibiotic ointment, 4x4 sterile gauze and post-op shoe   Patient tolerance of procedure:  Tolerated well, no immediate complications   (including critical care time)  Medications Ordered in UC Medications - No data to display  Initial Impression / Assessment and Plan / UC Course  I have reviewed the triage vital signs and the nursing notes.  Pertinent labs & imaging results that were available during my care of the patient were reviewed by me and considered in my medical decision making (see chart for details).    Ingrown toenail removed. Wound care instructions given. Follow up with podiatry for further evaluation. Return precautions given. Patient expresses understanding and agrees to plan.  Final Clinical Impressions(s) / UC Diagnoses   Final diagnoses:  Ingrown nail   ED Prescriptions    None     PDMP not reviewed this encounter.   Ok Edwards, PA-C 06/03/19 2030

## 2019-06-03 NOTE — Discharge Instructions (Signed)
Ingrown nail removed.  Wait 24 hours prior to removing this dressing.  Can take Tylenol, ice compress for symptoms.  Follow-up with podiatrist for further evaluation and management needed.

## 2019-07-01 ENCOUNTER — Other Ambulatory Visit: Payer: Self-pay | Admitting: Neurology

## 2019-07-03 ENCOUNTER — Ambulatory Visit (INDEPENDENT_AMBULATORY_CARE_PROVIDER_SITE_OTHER): Payer: PRIVATE HEALTH INSURANCE | Admitting: Neurology

## 2019-07-03 ENCOUNTER — Encounter: Payer: Self-pay | Admitting: Neurology

## 2019-07-03 VITALS — BP 118/78 | HR 59 | Temp 97.8°F | Ht 59.5 in | Wt 198.0 lb

## 2019-07-03 DIAGNOSIS — G441 Vascular headache, not elsewhere classified: Secondary | ICD-10-CM

## 2019-07-03 DIAGNOSIS — F39 Unspecified mood [affective] disorder: Secondary | ICD-10-CM | POA: Diagnosis not present

## 2019-07-03 DIAGNOSIS — M542 Cervicalgia: Secondary | ICD-10-CM | POA: Diagnosis not present

## 2019-07-03 DIAGNOSIS — R930 Abnormal findings on diagnostic imaging of skull and head, not elsewhere classified: Secondary | ICD-10-CM

## 2019-07-03 DIAGNOSIS — G43711 Chronic migraine without aura, intractable, with status migrainosus: Secondary | ICD-10-CM

## 2019-07-03 DIAGNOSIS — G939 Disorder of brain, unspecified: Secondary | ICD-10-CM

## 2019-07-03 DIAGNOSIS — R569 Unspecified convulsions: Secondary | ICD-10-CM

## 2019-07-03 NOTE — Progress Notes (Signed)
[facility]  PATIENT: Angel Rodriguez DOB: [birth year]  REFERRING DOCTOR OR PCP:   SOURCE: patient, ED records, images on PACS, reports in EMR  _________________________________   HISTORICAL  CHIEF COMPLAINT:  Chief Complaint  Patient presents with  . Follow-up    RM 13, alone. Last seen 01/01/2019.   [name] Seizures    Takes vimpat. No seizures per pt.  . Migraine    Takes sumatriptan/acetaminophen-codeine prn. Having more migraines. Pain on right side of temple. Has had 2 episodes this week.   . Mood    Takes trintellix/clonazepam     HISTORY OF PRESENT ILLNESS:  Angel Rodriguez is a 44 y.o. woman with frequent headaches and h/o seizure.  Update 07/03/2019 She has had headaches a couple days a week.   Pain can be severe at times. It is right sided and associated with erythema of the right eye.   Headaches have been much worse the last 2 weeks.  When a headache occurs, she takes sumatriptan +/- Tyl #3.  If she naps the HA is gone when she wakes up.  Additionally, she has neck pain.  She has had an abnormal brain MRI showing several subcortical foci that have fluctuated in size.  The etiology is uncertain.  This could be due to demyelination or 2 vasculitis.  She has not had any more seizures since starting Vimpat 100 mg po bid.    She was first on Lyrica but could not tolerate it well.     She is on Trintellix and clonazepam for anxiety.     She has had the 2 Pfizer vaccinations and tolerated them fairly well earlier this month      Update 01/01/19: Since the last visit, she has had a second opinion at [facility] for the abnormal MRI and seizures.  She had a repeat LP 08/13/2018 and the CSF was completely normal.  Only 4 cells and no OCB.    She was placed on Lyrica for the seizures but due to tolerability, switched to Vimpat 100 mg po bid.  She had been on Keppra and had one seizure on it but felt it helped in general.    She has not had any seizures since she has been  on Vimpat  She is also on Trintellix for her depression and anxiety and she feels it has helped her a lot.   She is less irritable.    She is having headaches still -with mild to moderate constant right-sided pain and 3-4 severe headaches a month.   She takes an Imitrex +/- Tyl # 3 when she gets a headache.  She reports her work does not allow her to take medications during the workday.  I will write a note.    When headaches have been severe she responds to a sphenopalatine ganglion block and splenius capitus TPI's have helped for the neck pain and occipital neuralgia-like component of pain.    She notes no new visual changes.    Reviewed multiple neurology notes, lab from [facility] as above.    MRI brain 07/06/2018 personally reviewed and compared to studies from January 2020.  It shows 2 foci in the anterior left frontal lobe.  One was mildly increased in size compared to the January MRI and one was decreased in size.  Another focus seen on the previous MRI in the left parasagittal frontal lobe has resolved.  She is concerned that the growths could represent brain tumor.  Due to the fluctuating status  and resolution of one of the foci, this is highly unlikely.  They could be due to vasculitis or demyelination or other inflammation   Update 06/27/2018 (virtual) She continues to experience headaches.  These are intermittent and are always right-sided worse than left side.  Most headaches are associated with nausea, photophobia and phonophobia.  Some are associated with erythema of the right eye.  Imitrex does not always help.  Tylenol 3 often helps..  She is not noting vision changes with the headaches..  She is having these migrainous headaches two days a week.   She does not have one right now.    In January she had neurologic symptoms including numbness and weakness in her right hand.  Details are in my visit notes from that day.  She feels she is better and she had complete resolution of the right-sided  symptoms.  The MRI is unusual with 3 juxtacortical and subcortical enhancing lesions, 2 of which were present on MRI 02/14/2016 with 1 of them enhancing at that time.  An MRI 07/02/2015 showed the 2 previous lesions without enhancement.  She was supposed to get a second opinion in late March at [facility] but it was rescheduled due to Covid-19.  I encouraged her to follow through as I do not have a clear diagnosis for her symptoms and MRI changes.  We discussed that the numbness and clumsiness in the hands she experienced could be explained by the enhancing lesions but the etiology is unclear  She feels stressed and is anxious.   She sees psychiatry.   She is on Trintellix and prn hydroxyzine to help with anxiety.        Update 03/18/2018: She was admitted to the hospital last week after presenting with right hand numbness, weakness and clumsiness.  She called a work acquaintance during the visit to describe her symptoms.  Although she seemed dazed while the symptoms were occurring before she left to go to the hospital, she did not have any generalized tonic-clonic activity.  She was less responsive but was able to understand what was being said to her and was communicating.  Numbness spread up over ten minutes to the entire leg and then was followed by the rest of the right side, including the face.   She had a mild headache but less severe than many of the headaches she has had in the past.      Notes from her hospital stay and multiple laboratory results and imaging studies were reviewed.  There was a concern that she might of had a seizure and she was started on Keppra.  She had imaging performed last week.  I personally reviewed the MRI images on PACS.    She has the 2 foci seen in 2017 in the left frontal subcortical white matter but the one focus has some enhancement and is larger.  The other is unchanged.   There is a newer midline subcortical focus, also on the left that enhances.  No spinal plaques.   She also had an LP.  Cells were mildly elevated (10 WBC  89% lymphs; 11% mono) and protein was normal.   Glucose was slightly elevated at 84.  OCB is pending.  VDRL is pending.    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   In 2016, she had a seizure.  MRI showed 2 moderate size nonenhancing subcortical foci  in the left frontal lobe.   A repeat MRI 02/14/2016 showed 2 juxtacortical left frontal lobe lesions.  Both were present on the 06/30/2015 MRI.  There was punctate enhancement adjacent to the more anterior frontal focus that had not been present on the previous MRI.  Diffusion weighted images were normal.  Between 2017 and 2019, she was seen multiple times for right-sided headaches.  Intranasal sphenopalatine ganglion block or up of rapid benefit.  Additionally verapamil seem to reduce the frequency.  Update 02/05/2018: She reports that headaches are back. They occur daily with migrainous features of nausea and photophobia/phonophobia.   They returned over a month ago.    She also notes that the right eye becomes red.     She feels stress worsens the headaches.     Movements worsen them.    She is taking Imitrex with benefit but headaches return.       She is on verapamil and that seemed to help her some for a while.     Headaches occur 30/30 of the last days and for > 4 hours/day.       Last sphenocath procedure was April 2019 and it knocked headaches out completely x 1 month and then they returned mildly.     She has a h/o central retinal vein occlusion and is on drops ([facility])  She had a single seizure in March 2016 and is not on an anticonvulsant.    Insomnia is doing well on her medications.     Update 10/25/2017: Her headaches are doing better since the sphenocath procedure and the trigger point injections.    She still has some headache pain but it is not severe.    Pain sometimes builds up some as the day  goes on, especially at work.     She denies any numbness, weakness or gait change.  She sees [facility] for her h/o central retinal vein occlusion.  She is still on eye drops.      She only had one seizure 3 years ago and is not on any anticonvulsant.       She has some insomnia.    She has trouble falling asleep and staying asleep.   She changed her clonazepma to 0.25 qAM and 0.75 mg qPM.  She has ovarian cancer and had surgery.   She had ovarian torsion but pathology showed possible cancer so she is going to have another operation.      Update 06/27/2017: HA's worsened again last week.    The conjunctivitis returned the next day.   All the pain is on her right side only.     In the past sphenopalatine ganglion blocks have helped the most.    She also has received some benefit form ONB/TPIs in the past.    She denies any additional seizures.      Insomnia was doing better and was helped by clonazepam.  However, she is doing worse since the headache has recurred.  Clonazepam also helps her anxiety.   Update 04/20/2017: Headaches are doing fairly well.   She is now having 3-4 HA's a month.    Verapamil has helped reduce the frequency of headaches.     When needed, Imitrex often helps.     She will sometimes try Tylenol first.   Due to low platelets, she can't take NSAIDs.     The TCPenia was felt to be due to fibroids and these are being treated.     She  still gets redness in the right eye, not always associated with HA.    In the past when her headaches were more severe, she had the Sphenocath procedure and it was very beneficial.    Occipital nerve blocks or TPI's have also been beneficial in the past.    She has not had any more seizure.   She just had one in 2016   From 07/11/2016: HA:   Her headaches were doing much better but they are have been occurring more the past month or two. Additionally she continues to have conjunctival redness on the right.     They greatly improved after  starting Verapamil and having occipital nerve block/splenius capitis muscle injection at the first visit and then [place] at the second, third and fourth visits.  After the second Sphenocath, the conjunctival redness got much better but then returned. After the fourth procedure, she went nearly pain-free for 6 months until milder headaches returned and activities slowly worsening. Pain improves  with Imitrex and rest much of the time.  She no longer can take NSAID    She tolerates the verapamil well but has occasional constipation .     Studies:   MRI of the brain showed a single small T2/FLAIR hyperintense focus in the left frontal subcortical white matter. The MR venogram was normal. ESR and ANA were normal.    Mood:  Anxiety is much better.   Clonazepam with Buspar has helped better than the escitalopram.    Seizure:   In March 2016, she had a seizure.   She had an aura of numbness in her right hand and then passed out.   She is not sure there was any generalized tonic-clonic activity. Her children did not tell her that she had any. However, she did have urinary incontinence. She does not remember much that day.    Since then, she has not had any more seizures.  She has not needed any AED as she had only the one seizure/spell.       Insomnia:   She is sleeping better since starting clonazepam and having much less headache  Thrombocytopenia:   Her platelet count was reduced to 78,000 and she saw her doctor in hematology. Repeat platelet count was 83,000. She was advised to stop anti-inflammatories.  REVIEW OF SYSTEMS: Constitutional: No fevers, chills, sweats, or change in appetite.  She has insomnia. Eyes: as above Ear, nose and throat: No hearing loss, ear pain, nasal congestion, sore throat Cardiovascular: No chest pain, palpitations Respiratory: No shortness of breath at rest or with exertion.   No wheezes GastrointestinaI: No nausea, vomiting, diarrhea, abdominal pain, fecal  incontinence Genitourinary: No dysuria, urinary retention or frequency.  No nocturia.   Possible ovarian cancer Musculoskeletal: No neck pain, back pain Integumentary: No rash, pruritus, skin lesions Neurological: as above Psychiatric: No depression at this time.  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 Allergic/Immunologic: No itchy/runny eyes, nasal congestion, recent allergic reactions, rashes  ALLERGIES: Allergies  Allergen Reactions  . Ivp Dye [Iodinated Diagnostic Agents] Hives, Itching and Swelling  . Bee Venom Swelling    HOME MEDICATIONS:  Current Outpatient Medications:  .  acetaminophen (TYLENOL) 500 MG tablet, Take 500 mg by mouth every 6 (six) hours as needed for headache (pain)., Disp: , Rfl:  .  Acetaminophen-Codeine 300-30 MG tablet,  ONE TO TWO AS NEEDED FOR HEADACHE. NO MORE THAN 4/DAY, Disp: 28 tablet, Rfl: 3 .  Ascorbic Acid (VITAMIN C) 1000 MG tablet, Take 1,000 mg by mouth daily., Disp: , Rfl:  .  atropine 1 % ophthalmic solution, Place 1 drop into the right eye 3 (three) times daily. , Disp: , Rfl:  .  brimonidine (ALPHAGAN P) 0.1 % SOLN, Place 1 drop into the right eye 3 (three) times daily. , Disp: , Rfl:  .  calcium carbonate (TUMS EX) 750 MG chewable tablet, Chew 2 tablets by mouth daily as needed for heartburn. , Disp: , Rfl:  .  clonazePAM (KLONOPIN) 0.5 MG tablet, Take 1 tablet (0.5 mg total) by mouth 2 (two) times daily as needed for anxiety., Disp: 180 tablet, Rfl: 1 .  dorzolamide-timolol (COSOPT) 22.3-6.8 MG/ML ophthalmic solution, Place 1 drop into the right eye 2 (two) times daily. , Disp: , Rfl:  .  fluticasone (FLONASE) 50 MCG/ACT nasal spray, Place 1-2 sprays into both nostrils daily as needed for allergies. , Disp: , Rfl:  .  Lacosamide  100 MG TABS, Take 100 mg by mouth 2 (two) times a day., Disp: , Rfl:  .  latanoprost (XALATAN) 0.005 % ophthalmic solution, Place 1 drop into both eyes at bedtime., Disp: , Rfl:  .  levETIRAcetam (KEPPRA) 500 MG tablet, Take by mouth as needed., Disp: , Rfl:  .  nystatin cream (MYCOSTATIN), Apply 1 application topically 2 (two) times daily. Apply to affected area BID for up to 7 days., Disp: 30 g, Rfl: 1 .  SUMAtriptan (IMITREX) 100 MG tablet, Take 1 tablet (100 mg total) by mouth once as needed for migraine. May repeat in 2 hours if headache persists or recurs., Disp: 18 tablet, Rfl: 3 .  verapamil (CALAN-SR) 240 MG CR tablet, Take 1 tablet (240 mg total) by mouth at bedtime., Disp: 90 tablet, Rfl: 3 .  VITAMIN D PO, Take 500 mg by mouth daily., Disp: , Rfl:  .  Vitamin D, Ergocalciferol, (DRISDOL) 1.25 MG (50000 UT) CAPS capsule, Take 1 capsule (50,000 Units total) by mouth every 7 (seven) days. (Patient taking differently: Take 50,000 Units by mouth every Saturday. ), Disp: 12 capsule, Rfl: 1 .  vortioxetine HBr (TRINTELLIX) 20 MG TABS tablet, Take 1 tablet (20 mg total) by mouth daily., Disp: 30 tablet, Rfl: 5 .  Zinc 50 MG CAPS, Take 1 capsule by mouth daily., Disp: , Rfl:   PAST MEDICAL HISTORY: Past Medical History:  Diagnosis Date  . Anxiety   . 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 Cluster headaches    started after seizure and migraines  . Complication of anesthesia    trouble with short term memory after surgeries  . COVID-19   . Depression   . Fibroids    s/p TLH, bilateral salpingectomy  . GERD (gastroesophageal reflux disease)    patient thinks due to medications  . History of anemia    prior to hysterectomy  . History of pneumonia   . History of trichomoniasis   . Seizures ([place]) x 1 2-3 yrs ago none since   once saw neurologist, full body brain MRI and another 6 months lster  . Status post right oophorectomy   . Thrombocytopenia ([place])    sees [place] with Heme  Onc    PAST SURGICAL HISTORY: Past Surgical History:  Procedure Laterality Date  . CYSTOSCOPY N/A 07/31/2017   Procedure: CYSTOSCOPY;  Surgeon: Megan Salon, MD;  Location: [facility];  Service: Gynecology;  Laterality: N/A;  . ENDOMETRIAL ABLATION    . EYE SURGERY     laser - removed blood from right eye  . HYSTEROSCOPY WITH D & C    . LAPAROSCOPY N/A 09/19/2017   Procedure: LAPAROSCOPY DIAGNOSTIC WITH RIGHT OOPHERECTOMY, LYSIS OF ADHESIONS;  Surgeon: Salvadore Dom, MD;  Location: [facility];  Service: Gynecology;  Laterality: N/A;  . OMENTECTOMY N/A 11/21/2017   Procedure: OMENTECTOMY AND PELVIC WASHINGS;  Surgeon: Isabel Caprice, MD;  Location: [facility];  Service: Gynecology;  Laterality: N/A;  . ROBOTIC ASSISTED SALPINGO OOPHERECTOMY Left 11/21/2017   Procedure: XI ROBOTIC ASSISTED LEFT SALPINGO OOPHORECTOMY;  Surgeon: Isabel Caprice, MD;  Location: [facility];  Service: Gynecology;  Laterality: Left;  . TOTAL LAPAROSCOPIC HYSTERECTOMY WITH SALPINGECTOMY Bilateral 07/31/2017   Procedure: TOTAL LAPAROSCOPIC HYSTERECTOMY WITH SALPINGECTOMY;  Surgeon: Megan Salon, MD;  Location: [facility];  Service: Gynecology;  Laterality: Bilateral;  20 week size uterus/ Alexis bag in room/ need 4.5 hours  . TUBAL LIGATION    . WISDOM TOOTH EXTRACTION      FAMILY HISTORY: Family History  Problem Relation Age of Onset  . Alcoholism Father   . Cirrhosis Father   . Alcohol abuse Father   . Cancer Maternal Grandmother        unsure of type; died of other causes  . Heart disease Maternal Aunt   . Drug abuse Cousin     SOCIAL HISTORY:  Social History   Socioeconomic History  . Marital status: Single    Spouse name: Not on file  . Number of children: Not on file  . Years of education: Not on file  . Highest education level: Not on file  Occupational History  . Not on file  Tobacco Use  . Smoking status: Former Smoker    Years: 20.00    Types: Cigarettes, E-cigarettes    Quit date: 05/05/2018     Years since quitting: 1.1  . Smokeless tobacco: Never Used  Substance and Sexual Activity  . Alcohol use: Not Currently  . Drug use: No  . Sexual activity: Yes    Birth control/protection: Surgical    Comment: hysterectomy  Other Topics Concern  . Not on file  Social History Narrative  . Not on file   Social Determinants of Health   Financial Resource Strain:   . Difficulty of Paying Living Expenses:   Food Insecurity:   . Worried About Charity fundraiser in the Last Year:   . Arboriculturist in the Last Year:   Transportation Needs:   . Film/video editor (Medical):   [name] Lack of Transportation (Non-Medical):   Physical Activity:   . Days of Exercise per Week:   . Minutes of Exercise per Session:   Stress:   . Feeling of Stress :   Social Connections:   . Frequency of Communication with Friends and Family:   . Frequency of Social Gatherings with Friends and Family:   . Attends Religious Services:   . Active Member of Clubs or Organizations:   . Attends Archivist Meetings:   [name] Marital Status:   Intimate Partner Violence:   . Fear of Current or Ex-Partner:   . Emotionally Abused:   [name] Physically Abused:   . Sexually Abused:      PHYSICAL EXAM  Vitals:   07/03/19 1616  BP: 118/78  Pulse: (!) 59  Temp: 97.8 F (36.6 C)  SpO2: 96%  Weight: 198 lb (89.8 kg)  Height: 4' 11.5" (1.511 m)    Body mass index is 39.32 kg/m.   General: The patient is well-developed and well-nourished and in no acute distress  Eyes:   She has conjunctival erythema on the right..  Normal funduscopic examination.  Neck: The neck is tender over the right occiput and splenius capitis muscle.  Range of motion is fairly normal  Neurologic Exam  Mental status: The patient is alert and oriented x 3 at the time of the examination. The patient has apparent normal recent and remote memory, with an apparently normal attention span and concentration ability.   Speech is  normal.  Cranial nerves: Extraocular movements are full.  She has very mild right ptosis and facial strength is normal elsewhere.  There is normal facial sensation to soft touch.   Trapezius and sternocleidomastoid strength is normal. No dysarthria is noted.  The tongue is midline, and the patient has symmetric elevation of the soft palate. No obvious hearing deficits are noted.  Motor:  Muscle bulk is normal.   Tone is normal. Strength is  5 / 5 in all 4 extremities.   Sensory: She has normal sensation to touch and vibration in the arms and legs   Gait and station: Station is normal.   Gait and the tandem gait are normal.  Romberg is negative.    DIAGNOSTIC DATA (LABS, IMAGING, TESTING) - I reviewed patient records, labs, notes, testing and imaging myself where available.  Lab Results  Component Value Date   WBC 8.8 07/10/2018   HGB 14.4 07/10/2018   HCT 42.8 07/10/2018   MCV 87.9 07/10/2018   PLT 59 (L) 07/10/2018      Component Value Date/Time   NA 143 02/04/2019 0902   NA 138 08/31/2016 0957   K 4.0 02/04/2019 0902   K 3.9 08/31/2016 0957   CL 105 02/04/2019 0902   CO2 24 02/04/2019 0902   CO2 22 08/31/2016 0957   GLUCOSE 116 (H) 02/04/2019 0902   GLUCOSE 148 (H) 07/10/2018 0915   GLUCOSE 106 08/31/2016 0957   BUN 11 02/04/2019 0902   BUN 8.6 08/31/2016 0957   CREATININE 0.61 02/04/2019 0902   CREATININE 0.8 08/31/2016 0957   CALCIUM 9.6 02/04/2019 0902   CALCIUM 10.3 08/31/2016 0957   PROT 7.2 02/04/2019 0902   PROT 7.6 08/31/2016 0957   ALBUMIN 4.5 02/04/2019 0902   ALBUMIN 3.9 08/31/2016 0957   AST 16 02/04/2019 0902   AST 18 08/31/2016 0957   ALT 28 02/04/2019 0902   ALT 23 08/31/2016 0957   ALKPHOS 141 (H) 02/04/2019 0902   ALKPHOS 62 08/31/2016 0957   BILITOT 0.2 02/04/2019 0902   BILITOT 0.43 08/31/2016 0957   GFRNONAA 111 02/04/2019 0902   GFRAA 128 02/04/2019 0902        PROCEDURE  The patient was placed in the supine position. A  temperature strip was added to the cheek area after the area was cleaned with alcohol. The Sphenocath was lubricated with gel, and placed in the right naris. The catheter was inserted above the middle turbinate to the posterior nasal cavity, and then withdrawn 1 cm. The catheter was deployed and rotated approximately 20 towards the nose. 2-1/2 mL of 2% lidocaine was deployed. The patient was asked to swallow during the injection. The patient demonstrated erythema of the sclera of the eye on this side, and an increase in the cheek temperature was noted from 32C to 34.C.  The patient tolerated the procedure well. No complications of the procedure were noted. The patient was kept in the supine position for 2-3 minutes following the procedure. .   _________________________________________________________  ASSESSMENT AND PLAN  Seizure ([place])  Intractable chronic migraine without aura and with status migrainosus  Abnormal MRI of head  Mood disorder ([facility])  Vascular headache   1.   She will continue Vimpat for probable seizures.   Continue Trintellix/clonazepam for mood 2.   Sphenopalatine ganglion injection on the right with 2.5 cc lidocaine.   3.   Continue medications for migraines.   4.   Trigger point injection with 60 mg Depo-Medrol in 3 cc Marcaine into the splenius capitis muscle and surrounding region on the right using sterile technique.  .  She tolerated the injection well and pain was better afterwards.   5.  If new symptoms will need to re-image due to changes on last MRI worrisome for inflammation or vasculitis.   A. Felecia Shelling, MD, PhD 7/41/2878, 6:76 PM Certified in Neurology, Clinical Neurophysiology, Sleep Medicine, Pain Medicine and Neuroimaging   [facility] [address] [place], [place] [address] [phone] [id number]

## 2019-07-07 ENCOUNTER — Telehealth (INDEPENDENT_AMBULATORY_CARE_PROVIDER_SITE_OTHER): Payer: No Typology Code available for payment source | Admitting: Psychiatry

## 2019-07-07 ENCOUNTER — Other Ambulatory Visit: Payer: Self-pay

## 2019-07-07 DIAGNOSIS — F411 Generalized anxiety disorder: Secondary | ICD-10-CM

## 2019-07-07 DIAGNOSIS — F410 Panic disorder [episodic paroxysmal anxiety] without agoraphobia: Secondary | ICD-10-CM

## 2019-07-07 DIAGNOSIS — F324 Major depressive disorder, single episode, in partial remission: Secondary | ICD-10-CM | POA: Diagnosis not present

## 2019-07-07 MED ORDER — VORTIOXETINE HBR 10 MG PO TABS: 30.0000 mg | ORAL_TABLET | Freq: Every day | ORAL | 2 refills | Status: DC
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
## 2019-07-09 ENCOUNTER — Telehealth: Payer: Self-pay | Admitting: Obstetrics & Gynecology

## 2019-07-09 NOTE — Telephone Encounter (Signed)
Patient has not returned calls for scheduling Family Practice referral. Per Dr. Sabra Heck the referral has been closed.

## 2019-07-15 ENCOUNTER — Telehealth: Payer: Self-pay | Admitting: Neurology

## 2019-07-15 NOTE — Telephone Encounter (Signed)
[facility] Auth: NPR Ref # [id number] on 07/15/19 order faxed to [facility] they will reach out to the patient to schedule.

## 2019-07-18 NOTE — Telephone Encounter (Signed)
[facility] has called x2

## 2019-10-02 NOTE — Telephone Encounter (Signed)
Patient called stating that she would like to go to [facility]. She is scheduled for Saturday 10/11/19 arrival time is 10:30 AM. Patient is aware of time and day. She is also aware to go through the ER entrance and to let them know she is there for a out patient MRI and they will get her. She also has their number of [phone] incase she needs to r/s.

## 2019-10-03 ENCOUNTER — Other Ambulatory Visit: Payer: Self-pay

## 2019-10-03 ENCOUNTER — Telehealth (INDEPENDENT_AMBULATORY_CARE_PROVIDER_SITE_OTHER): Payer: No Typology Code available for payment source | Admitting: Psychiatry

## 2019-10-03 DIAGNOSIS — F324 Major depressive disorder, single episode, in partial remission: Secondary | ICD-10-CM | POA: Diagnosis not present

## 2019-10-03 DIAGNOSIS — F410 Panic disorder [episodic paroxysmal anxiety] without agoraphobia: Secondary | ICD-10-CM | POA: Diagnosis not present

## 2019-10-03 DIAGNOSIS — F411 Generalized anxiety disorder: Secondary | ICD-10-CM

## 2019-10-03 MED ORDER — VORTIOXETINE HBR 10 MG PO TABS: 30.0000 mg | ORAL_TABLET | Freq: Every day | ORAL | 1 refills | Status: DC

## 2019-10-03 MED ORDER — CLONAZEPAM 0.5 MG PO TABS: 0.5000 mg | ORAL_TABLET | Freq: Two times a day (BID) | ORAL | 1 refills | Status: DC | PRN
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
## 2019-10-11 ENCOUNTER — Ambulatory Visit (HOSPITAL_COMMUNITY): Payer: PRIVATE HEALTH INSURANCE

## 2019-10-15 ENCOUNTER — Other Ambulatory Visit: Payer: Self-pay

## 2019-10-15 ENCOUNTER — Ambulatory Visit (HOSPITAL_COMMUNITY)
Admission: RE | Admit: 2019-10-15 | Discharge: 2019-10-15 | Disposition: A | Discharge status: Home / Self Care | Payer: PRIVATE HEALTH INSURANCE | Source: Ambulatory Visit | Attending: Neurology | Admitting: Neurology

## 2019-10-15 DIAGNOSIS — R930 Abnormal findings on diagnostic imaging of skull and head, not elsewhere classified: Secondary | ICD-10-CM

## 2019-10-15 DIAGNOSIS — G441 Vascular headache, not elsewhere classified: Secondary | ICD-10-CM | POA: Insufficient documentation

## 2019-10-15 IMAGING — MR MR HEAD WO/W CM
7 of 12 series · 28 of 48 positions shown · IV contrast (gadavist)
Comparison: [DATE].  [DATE].  [DATE].

CLINICAL DATA: Worsening headache. Waxing and waning brain lesions.

EXAM:
MRI HEAD WITHOUT AND WITH CONTRAST
TECHNIQUE: Multiplanar, multiecho pulse sequences of the brain and surrounding
structures were obtained without and with intravenous contrast.
CONTRAST:  7.5mL GADAVIST GADOBUTROL 1 MMOL/ML IV SOLN

[Series 2: DWI · axial · 3.0mm · 0.94mm/px · z∈[-116,+30]mm · 9 of 100 slices shown (1 of 2)]
[im 1/100]
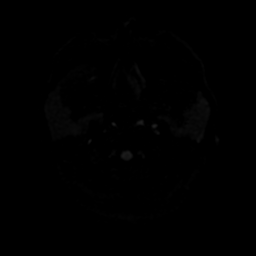
[im 13/100]
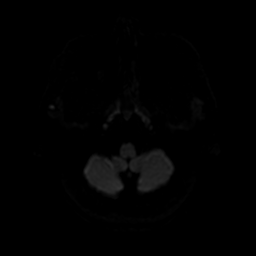
[im 25/100]
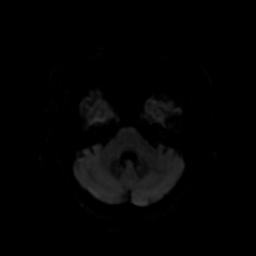
[im 38/100]
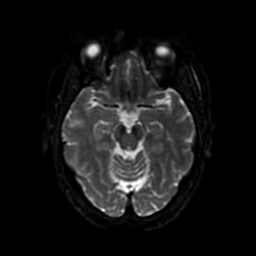
[im 50/100]
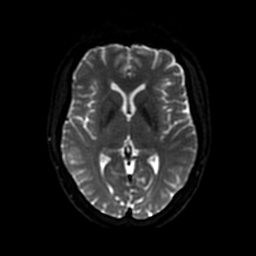
[im 62/100]
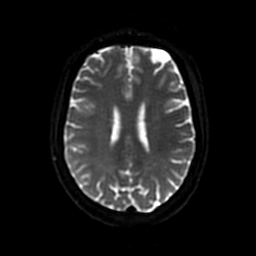
[im 75/100]
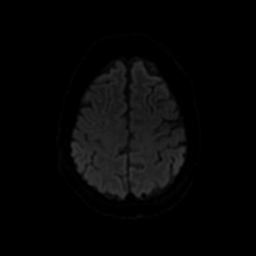
[im 87/100]
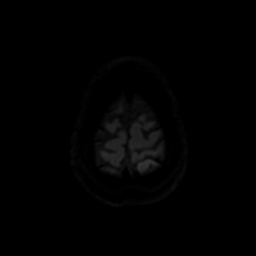
[im 100/100]
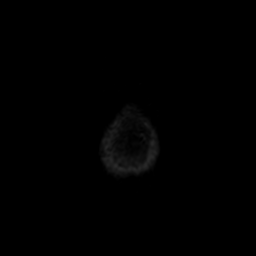

[Series 3: DWI · coronal · 4.0mm · 0.94mm/px · 6 of 74 slices shown (2 of 2)]
[im 1/74]
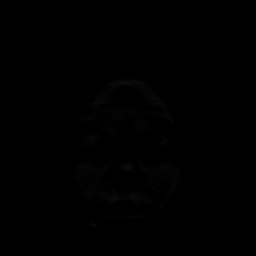
[im 15/74]
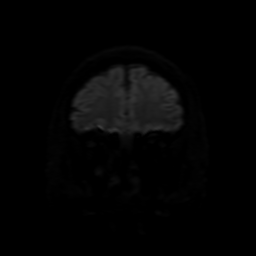
[im 30/74]
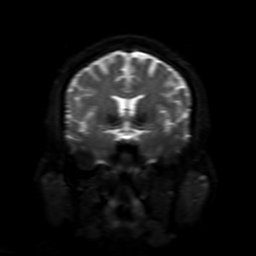
[im 44/74]
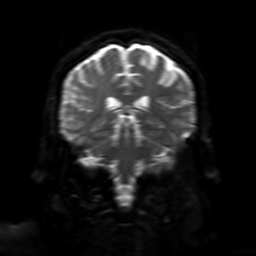
[im 59/74]
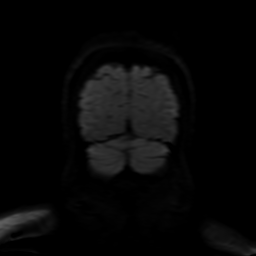
[im 74/74]
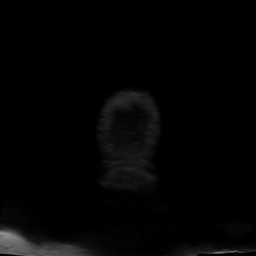

[Series 4: FLAIR · sagittal · 5.0mm · 0.23mm/px · 2 of 23 slices shown (1 of 2)]
[im 1/23]
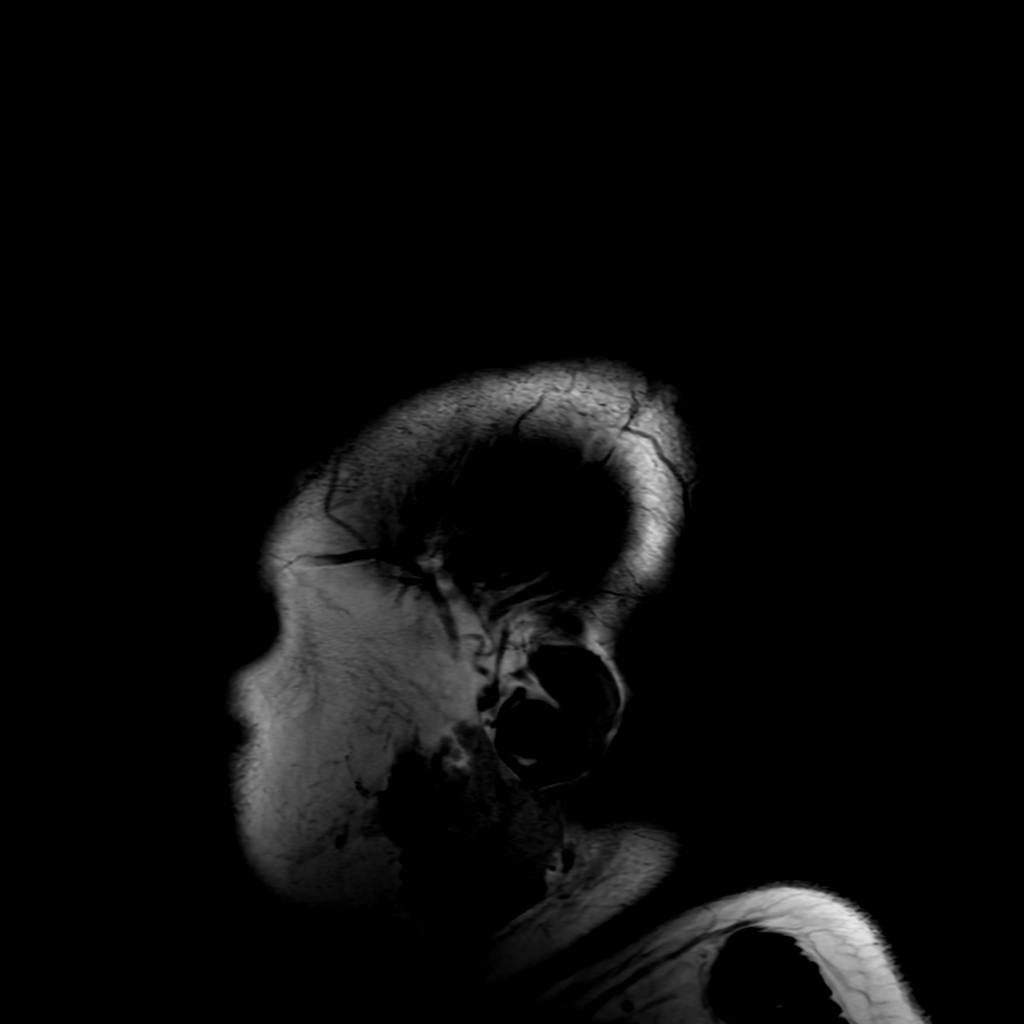
[im 23/23]
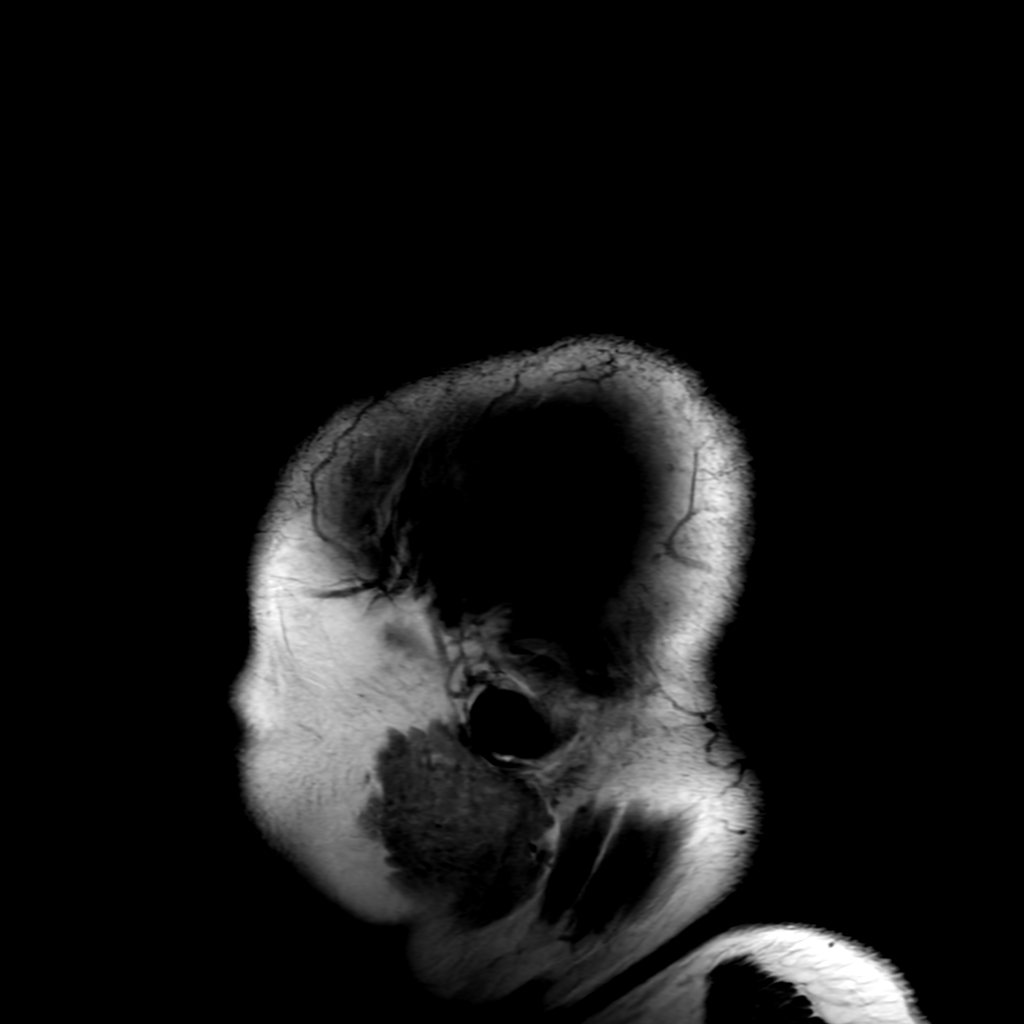

[Series 5: FLAIR · axial · 3.0mm · 0.41mm/px · z∈[-123,+32]mm · 2 of 27 slices shown (2 of 2)]
[im 1/27]
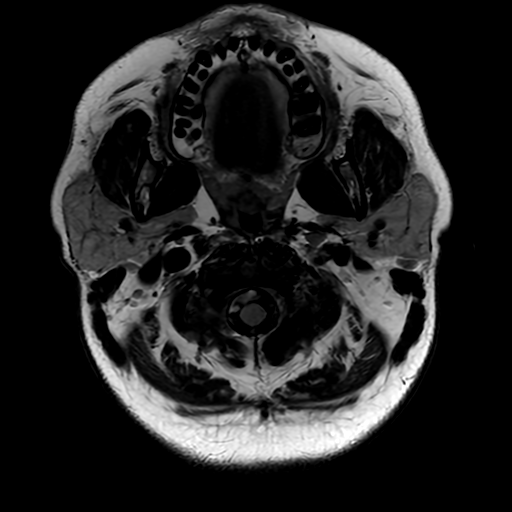
[im 27/27]
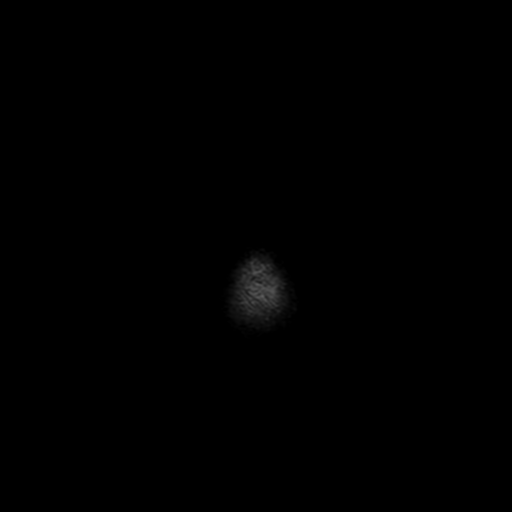

[Series 7: T2 · axial · 5.0mm · 0.47mm/px · z∈[-125,+29]mm · 2 of 27 slices shown]
[im 1/27]
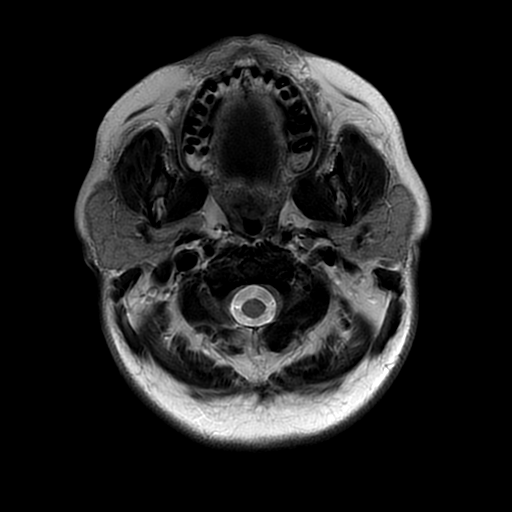
[im 27/27]
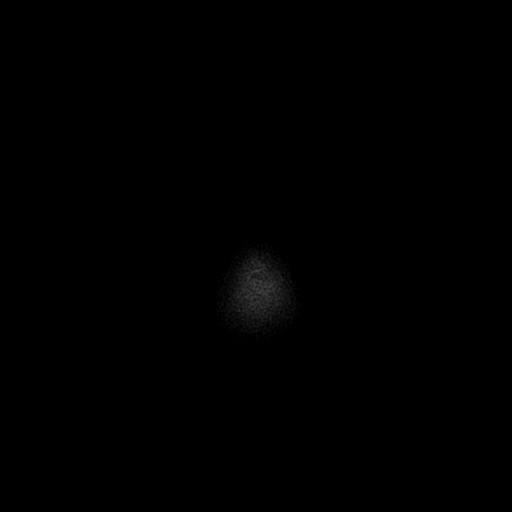

[Series 250: ADC · axial · 3.0mm · 0.94mm/px · z∈[-116,+30]mm · 4 of 50 slices shown (1 of 2)]
[im 1/50]
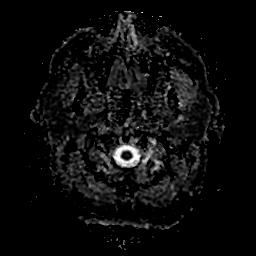
[im 17/50]
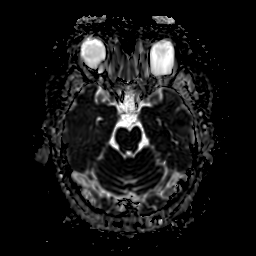
[im 33/50]
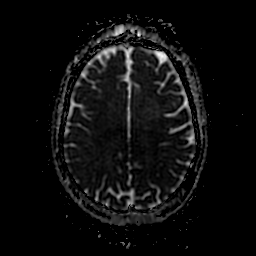
[im 50/50]
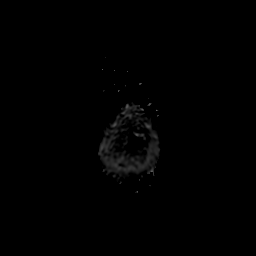

[Series 350: ADC · coronal · 4.0mm · 0.94mm/px · 3 of 37 slices shown (2 of 2)]
[im 1/37]
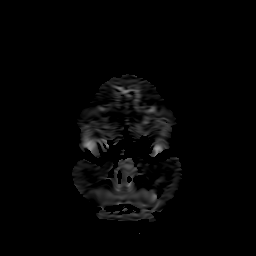
[im 19/37]
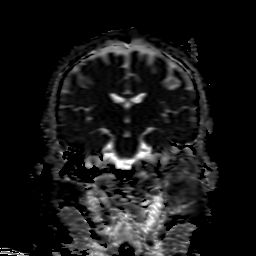
[im 37/37]
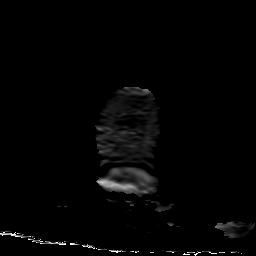

[28 of 48 positions shown; findings below may reference images not displayed]

FINDINGS: Brain: Diffusion imaging does not show any acute or subacute
infarction or other cause of restricted diffusion. No abnormality
affects the brainstem, cerebellum or right cerebral hemisphere.

On the left, as seen previously, there are a few foci of chronic
cortical and subcortical T2 and FLAIR signal with slight volume loss
which have been present since [OH] and represent old inactive
insults. There are a few other areas T2 and FLAIR signal affecting
the cortical and subcortical brain that have showed a waxing and
waning pattern over the more recent examinations, with migratory
foci of contrast enhancement in association. Since the study [DATE],
there has been resolution the region of T2 and FLAIR signal within
the posterior frontal cortical and subcortical brain. One could
question slight increased prominence of a T2 and FLAIR focus without
enhancement in the subcortical brain at the bottom of a left frontal
sulcus, axial FLAIR image 19. After contrast administration, there
are a few scattered small foci of contrast enhancement which appear
largely chronic. No sign of acute worsening focus of enhancement.

No sign of neoplastic mass lesion, hemorrhage, hydrocephalus or
extra-axial collection.

Vascular: Major vessels at the base of the brain show flow. No sign
of venous thrombosis by routine brain MR.

Skull and upper cervical spine: Negative

Sinuses/Orbits: Clear/normal

Other: None
IMPRESSION: 1. Resolution of a region of T2 and FLAIR signal within the
posterior frontal cortical and subcortical brain on the study [DATE]. One could question slight increased prominence of a T2 and
FLAIR focus without enhancement in the subcortical brain at the
bottom of a left frontal sulcus, axial FLAIR image 19. No other new
or potentially progressive finding. Minimal small foci of cortical
and subcortical enhancement in the left frontal region. I think the
differential diagnosis for this case remains that of a vasculitis
syndrome, venous thrombotic syndrome, autoimmune syndrome or
demyelinating syndrome. I do not think we are dealing with tumor or
arterial infarctions. Postictal changes could be considered but I do
not think there is a recent history of seizures.
2. A few other areas of cortical and subcortical T2 and FLAIR signal
in the left frontal region with slight volume loss are stable since
[OH] and represent old inactive insults.

## 2019-10-15 MED ORDER — GADOBUTROL 1 MMOL/ML IV SOLN
7.5000 mL | Freq: Once | INTRAVENOUS | Status: AC | PRN
  Administered 2019-10-15: 7.5 mL via INTRAVENOUS

## 2019-10-29 ENCOUNTER — Other Ambulatory Visit: Payer: Self-pay | Admitting: Neurology

## 2019-10-29 NOTE — Telephone Encounter (Signed)
I called and left a message but got voicemail.    The MRI of the brain showed some improvement with resolution of some of the foci in the brain, one focus looked a little larger.  The etiology is still uncertain but could represent vasculitis.  Lab work has been negative.  We could try a slow steroid titration (starting at 60 mg daily and reducing to 20 mg over the next 6 weeks) further reduction would be more slowly after she has a chance to see me.  I did note that she has an appointment to see [facility] in about a month.  If she prefers or if she plans to transfer her care there, she could hold off on the above treatment until she has a chance to discuss with the neurologist there

## 2019-10-30 MED ORDER — PREDNISONE 10 MG PO TABS: ORAL_TABLET | ORAL | 3 refills | Status: DC

## 2019-10-30 MED ORDER — LACOSAMIDE 100 MG PO TABS: 100.0000 mg | ORAL_TABLET | Freq: Two times a day (BID) | ORAL | 1 refills | Status: DC

## 2019-10-30 NOTE — Telephone Encounter (Signed)
Pt has called Emma, RN back.  Please call 

## 2019-10-30 NOTE — Telephone Encounter (Signed)
Called pt back. Relayed below message. She is agreeable to try prednisone first. She wrote down instructions. Aware we are also going to send in refills for vimpat. I e-scribed both to [facility] on file. Made f/u appt for 12/08/19 at 1:30pm with Dr. Felecia Shelling. She will call prior to appt if sx have not improved.

## 2019-10-30 NOTE — Telephone Encounter (Signed)
Called pt back and relayed Dr. Garth Rodriguez message. Pt wondering if she would be a candidate for botox instead of prednisone. States she has spoken with Dr. Felecia Rodriguez about this in the past. She would prefer this instead d/t prednisone causing weight gain. She previously gained a lot of weight and would like to avoid this if possible.  She has not seen Dr. Doy Rodriguez in the past year (last visit being 08/01/18). He prescribed her vimpat 100mg  po BID and she is tolerating well. Wondering if Dr. Felecia Rodriguez would take over her care for and refill vimpat? She does not feel she is getting benefit from seeing Dr. Doy Rodriguez at [facility]. They have not found any other reason for sx. She would feel more comfortable staying under Dr. Garth Rodriguez care. Advised I will discuss with Dr. Felecia Rodriguez and call her back.

## 2019-10-30 NOTE — Telephone Encounter (Addendum)
Spoke with Dr. Felecia Shelling. He would still recommend her trying prednisone as she is not presenting with a typical migraine pattern. MRI showed some inflammation and feels prednisone would be the better option. He also approved taking over refills for Vimpat. Would like her to schedule a follow up with him in about 2 months.   I called and LVM for pt to call office back.

## 2019-10-30 NOTE — Telephone Encounter (Signed)
Called, LVM for pt to call office back to discuss below.   "The MRI actually looked better than her previous MRI. Since she is continuing to have a lot of headaches, I would like to see if steroids on a more continuous basis will help. Please send in prednisone 10 mg #100 with 3 refills  6 p.o. daily x1 week, then 5 p.o. daily x1 week, then 4 p.o. daily x1 week, then 3 p.o. daily x1 week then 2 p.o. daily   then, she could continue that dose until she sees me and we can make an appointment to come in sometime in the next 6 to 8 weeks  I did notice that she had a follow-up visit with [facility] next month. If she is planning to transferring her care there, she may want to wait to do the above until she has a chance to see them"

## 2019-10-30 NOTE — Addendum Note (Signed)
Addended by: Wyvonnia Lora on: 10/30/2019 01:28 PM   Modules accepted: Orders

## 2019-11-27 ENCOUNTER — Other Ambulatory Visit: Payer: Self-pay | Admitting: Neurology

## 2019-12-08 ENCOUNTER — Ambulatory Visit (INDEPENDENT_AMBULATORY_CARE_PROVIDER_SITE_OTHER): Payer: No Typology Code available for payment source | Admitting: Neurology

## 2019-12-08 ENCOUNTER — Encounter: Payer: Self-pay | Admitting: Neurology

## 2019-12-08 ENCOUNTER — Other Ambulatory Visit: Payer: Self-pay

## 2019-12-08 VITALS — BP 131/81 | HR 77 | Ht 59.5 in | Wt 205.5 lb

## 2019-12-08 DIAGNOSIS — M79641 Pain in right hand: Secondary | ICD-10-CM

## 2019-12-08 DIAGNOSIS — R200 Anesthesia of skin: Secondary | ICD-10-CM

## 2019-12-08 DIAGNOSIS — I776 Arteritis, unspecified: Secondary | ICD-10-CM | POA: Diagnosis not present

## 2019-12-08 DIAGNOSIS — G441 Vascular headache, not elsewhere classified: Secondary | ICD-10-CM

## 2019-12-08 DIAGNOSIS — M79642 Pain in left hand: Secondary | ICD-10-CM

## 2019-12-08 DIAGNOSIS — R569 Unspecified convulsions: Secondary | ICD-10-CM

## 2019-12-08 MED ORDER — PREDNISONE 10 MG PO TABS: 10.0000 mg | ORAL_TABLET | Freq: Every day | ORAL | 3 refills | Status: DC

## 2019-12-08 NOTE — Progress Notes (Signed)
[facility]  PATIENT: Angel Rodriguez DOB: Jul 29, 1975  REFERRING DOCTOR OR PCP:   SOURCE: patient, ED records, images on PACS, reports in EMR  _________________________________   HISTORICAL  CHIEF COMPLAINT:  Chief Complaint  Patient presents with   Follow-up    RM 13,alone. Last seen 07/03/2019.    Seizures    Takes vimpat   Migraine    takes tylenol, imitrex prn   Mood    takes trintellix, clonazepam     HISTORY OF PRESENT ILLNESS:  Angel Rodriguez is a 44 y.o. woman with frequent headaches and h/o seizure.  Update 12/08/2019: She feels her headaches are doing a little better currently.  She felt much better on steroid treatment.  She is out of prednisone currently as of a few days ago.   This is the best she has felt in several years.   She has not had as much erythema in the right eye.   She denies any visual changes.   She has numbness in hr hands but no weakness.  The numbness is worse on the right.   It is in the palms more than dorsum.  Shaking her arms help.      She has no recent seizure.   Her last one was April 2021.  She is on Vimpat.    She is on Trintellix and clonazepam for anxiety.   Mood has done better with improved pain.  Vasculitis?  History: She has had fluctuating headaches, right visual changes and abnormal MRI with fluctuating lesions with variable enhancement since 2017.  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  She had a second opinion from [place].  Changes in her seizure medications were made but no opinion about the possibility of vasculitis.  She was placed on prednisone 60 mg and titrating down in July 2021.  She had improvement of headaches and other symptoms.  MRI 10/15/2019 showed:   "Resolution of a region of T2 and FLAIR signal within the posterior frontal cortical and subcortical brain on the study of May. One could question slight increased prominence of a T2 and FLAIR focus without enhancement in the  subcortical brain at the bottom of a left frontal sulcus, axial FLAIR image 19. No other new or potentially progressive finding. Minimal small foci of cortical and subcortical enhancement in the left frontal region. I think the differential diagnosis for this case remains that of a vasculitis syndrome, venous thrombotic syndrome, autoimmune syndrome or demyelinating syndrome. I do not think we are dealing with tumor or arterial infarctions. "  MRI brain 07/06/2018 personally reviewed and compared to studies from January 2020.  It shows 2 foci in the anterior left frontal lobe.  One was mildly increased in size compared to the January MRI and one was decreased in size.  Another focus seen on the previous MRI in the left parasagittal frontal lobe has resolved.  MRI of the cervical and thoracic spine 03/25/2018 showed normal spinal cord and mild cervical degenerative changes.  Labs: CSF 03/25/2018 showed 10 white blood cells (lymphocytes), 3 red cells, normal glucose, protein oligoclonal bands, VDRL.  In 03/2018, negative ANA, cANCA and pANCA, weakly positive ANCA proteinase 3, normal ACE level. ESR 10.  HIV was negative 09/22/2017.  Hepatitis C was negative 04/2016  Update 06/27/2018 (virtual) She continues to experience headaches.  These are intermittent and are always right-sided worse than left side.  Most headaches are associated with nausea, photophobia and phonophobia.  Some  are associated with erythema of the right eye.  Imitrex does not always help.  Tylenol 3 often helps..  She is not noting vision changes with the headaches..  She is having these migrainous headaches two days a week.   She does not have one right now.    In January she had neurologic symptoms including numbness and weakness in her right hand.  Details are in my visit notes from that day.  She feels she is better and she had complete resolution of the right-sided symptoms.  The MRI is unusual with 3 juxtacortical and subcortical  enhancing lesions, 2 of which were present on MRI 02/14/2016 with 1 of them enhancing at that time.  An MRI 07/02/2015 showed the 2 previous lesions without enhancement.  She was supposed to get a second opinion in late March at [facility] but it was rescheduled due to Covid-19.  I encouraged her to follow through as I do not have a clear diagnosis for her symptoms and MRI changes.  We discussed that the numbness and clumsiness in the hands she experienced could be explained by the enhancing lesions but the etiology is unclear  She feels stressed and is anxious.   She sees psychiatry.   She is on Trintellix and prn hydroxyzine to help with anxiety.        Update 03/18/2018: She was admitted to the hospital last week after presenting with right hand numbness, weakness and clumsiness.  She called a work acquaintance during the visit to describe her symptoms.  Although she seemed dazed while the symptoms were occurring before she left to go to the hospital, she did not have any generalized tonic-clonic activity.  She was less responsive but was able to understand what was being said to her and was communicating.  Numbness spread up over ten minutes to the entire leg and then was followed by the rest of the right side, including the face.   She had a mild headache but less severe than many of the headaches she has had in the past.      Notes from her hospital stay and multiple laboratory results and imaging studies were reviewed.  There was a concern that she might of had a seizure and she was started on Keppra.  She had imaging performed last week.  I personally reviewed the MRI images on PACS.    She has the 2 foci seen in 2017 in the left frontal subcortical white matter but the one focus has some enhancement and is larger.  The other is unchanged.   There is a newer midline subcortical focus, also on the left that enhances.  No spinal plaques.  She also had an LP.  Cells were mildly elevated (10 WBC  89% lymphs;  11% mono) and protein was normal.   Glucose was slightly elevated at 84.  OCB is pending.  VDRL is pending.    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   In 2016, she had a seizure.  MRI showed 2 moderate size nonenhancing subcortical foci in the left frontal lobe.   A repeat MRI 02/14/2016 showed 2 juxtacortical left frontal lobe lesions.  Both were present on the 06/30/2015 MRI.  There was punctate enhancement adjacent to the more anterior frontal focus that had not been present on the previous MRI.  Diffusion weighted images were normal.  Between 2017 and  2019, she was seen multiple times for right-sided headaches.  Intranasal sphenopalatine ganglion block or up of rapid benefit.  Additionally verapamil seem to reduce the frequency.  Update 02/05/2018: She reports that headaches are back. They occur daily with migrainous features of nausea and photophobia/phonophobia.   They returned over a month ago.    She also notes that the right eye becomes red.     She feels stress worsens the headaches.     Movements worsen them.    She is taking Imitrex with benefit but headaches return.       She is on verapamil and that seemed to help her some for a while.     Headaches occur 30/30 of the last days and for > 4 hours/day.       Last sphenocath procedure was April 2019 and it knocked headaches out completely x 1 month and then they returned mildly.     She has a h/o central retinal vein occlusion and is on drops ([facility])  She had a single seizure in March 2016 and is not on an anticonvulsant.    Insomnia is doing well on her medications.     Update 10/25/2017: Her headaches are doing better since the sphenocath procedure and the trigger point injections.    She still has some headache pain but it is not severe.    Pain sometimes builds up some as the day goes on, especially at work.     She denies any numbness, weakness or  gait change.  She sees [facility] for her h/o central retinal vein occlusion.  She is still on eye drops.      She only had one seizure 3 years ago and is not on any anticonvulsant.       She has some insomnia.    She has trouble falling asleep and staying asleep.   She changed her clonazepma to 0.25 qAM and 0.75 mg qPM.  She has ovarian cancer and had surgery.   She had ovarian torsion but pathology showed possible cancer so she is going to have another operation.      Update 06/27/2017: HA's worsened again last week.    The conjunctivitis returned the next day.   All the pain is on her right side only.     In the past sphenopalatine ganglion blocks have helped the most.    She also has received some benefit form ONB/TPIs in the past.    She denies any additional seizures.      Insomnia was doing better and was helped by clonazepam.  However, she is doing worse since the headache has recurred.  Clonazepam also helps her anxiety.   Update 04/20/2017: Headaches are doing fairly well.   She is now having 3-4 HA's a month.    Verapamil has helped reduce the frequency of headaches.     When needed, Imitrex often helps.     She will sometimes try Tylenol first.   Due to low platelets, she can't take NSAIDs.     The TCPenia was felt to be due to fibroids and these are being treated.     She still gets redness in the right eye, not always associated with HA.    In the past when her headaches were more severe, she had the Sphenocath procedure and it was very beneficial.    Occipital nerve blocks or TPI's have also been beneficial in the past.    She has not had any  more seizure.   She just had one in 2016   From 07/11/2016: HA:   Her headaches were doing much better but they are have been occurring more the past month or two. Additionally she continues to have conjunctival redness on the right.     They greatly improved after starting Verapamil and having occipital nerve block/splenius capitis  muscle injection at the first visit and then [place] at the second, third and fourth visits.  After the second Sphenocath, the conjunctival redness got much better but then returned. After the fourth procedure, she went nearly pain-free for 6 months until milder headaches returned and activities slowly worsening. Pain improves  with Imitrex and rest much of the time.  She no longer can take NSAID    She tolerates the verapamil well but has occasional constipation .     Studies:   MRI of the brain showed a single small T2/FLAIR hyperintense focus in the left frontal subcortical white matter. The MR venogram was normal. ESR and ANA were normal.    Mood:  Anxiety is much better.   Clonazepam with Buspar has helped better than the escitalopram.    Seizure:   In March 2016, she had a seizure.   She had an aura of numbness in her right hand and then passed out.   She is not sure there was any generalized tonic-clonic activity. Her children did not tell her that she had any. However, she did have urinary incontinence. She does not remember much that day.    Since then, she has not had any more seizures.  She has not needed any AED as she had only the one seizure/spell.       Insomnia:   She is sleeping better since starting clonazepam and having much less headache  Thrombocytopenia:   Her platelet count was reduced to 78,000 and she saw her doctor in hematology. Repeat platelet count was 83,000. She was advised to stop anti-inflammatories.  REVIEW OF SYSTEMS: Constitutional: No fevers, chills, sweats, or change in appetite.  She has insomnia. Eyes: as above Ear, nose and throat: No hearing loss, ear pain, nasal congestion, sore throat Cardiovascular: No chest pain, palpitations Respiratory: No shortness of breath at rest or with exertion.   No wheezes GastrointestinaI: No nausea, vomiting, diarrhea, abdominal pain, fecal incontinence Genitourinary: No dysuria, urinary retention or frequency.  No  nocturia.   Possible ovarian cancer Musculoskeletal: No neck pain, back pain Integumentary: No rash, pruritus, skin lesions Neurological: as above Psychiatric: No depression at this time.  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 Allergic/Immunologic: No itchy/runny eyes, nasal congestion, recent allergic reactions, rashes  ALLERGIES: Allergies  Allergen Reactions   Ivp Dye [Iodinated Diagnostic Agents] Hives, Itching and Swelling   Bee Venom Swelling    HOME MEDICATIONS:  Current Outpatient Medications:    acetaminophen (TYLENOL) 500 MG tablet, Take 500 mg by mouth every 6 (six) hours as needed for headache (pain)., Disp: , Rfl:    Acetaminophen-Codeine 300-30 MG tablet, ONE TO TWO AS NEEDED FOR HEADACHE. NO MORE THAN 4/DAY, Disp: 28 tablet, Rfl: 3   Ascorbic Acid (VITAMIN C) 1000 MG tablet, Take 1,000 mg by mouth daily., Disp: , Rfl:    atropine 1 % ophthalmic solution, Place 1 drop into the right eye 3 (three) times daily. , Disp: , Rfl:  brimonidine (ALPHAGAN P) 0.1 % SOLN, Place 1 drop into the right eye 3 (three) times daily. , Disp: , Rfl:    calcium carbonate (TUMS EX) 750 MG chewable tablet, Chew 2 tablets by mouth daily as needed for heartburn. , Disp: , Rfl:    clonazePAM (KLONOPIN) 0.5 MG tablet, Take 1 tablet (0.5 mg total) by mouth 2 (two) times daily as needed for anxiety., Disp: 180 tablet, Rfl: 1   dorzolamide-timolol (COSOPT) 22.3-6.8 MG/ML ophthalmic solution, Place 1 drop into the right eye 2 (two) times daily. , Disp: , Rfl:    fluticasone (FLONASE) 50 MCG/ACT nasal spray, Place 1-2 sprays into both nostrils daily as needed for allergies. , Disp: , Rfl:    Lacosamide 100 MG TABS, Take 1 tablet (100 mg total) by mouth 2 (two) times daily., Disp:  180 tablet, Rfl: 1   latanoprost (XALATAN) 0.005 % ophthalmic solution, Place 1 drop into both eyes at bedtime., Disp: , Rfl:    nystatin cream (MYCOSTATIN), Apply 1 application topically 2 (two) times daily. Apply to affected area BID for up to 7 days., Disp: 30 g, Rfl: 1   SUMAtriptan (IMITREX) 100 MG tablet, Take 1 tablet (100 mg total) by mouth once as needed for migraine. May repeat in 2 hours if headache persists or recurs., Disp: 18 tablet, Rfl: 3   verapamil (CALAN-SR) 240 MG CR tablet, TAKE 1 TABLET BY MOUTH AT BEDTIME., Disp: 90 tablet, Rfl: 3   VITAMIN D PO, Take 500 mg by mouth daily., Disp: , Rfl:    Vitamin D, Ergocalciferol, (DRISDOL) 1.25 MG (50000 UT) CAPS capsule, Take 1 capsule (50,000 Units total) by mouth every 7 (seven) days. (Patient taking differently: Take 50,000 Units by mouth every Saturday. ), Disp: 12 capsule, Rfl: 1   vortioxetine HBr (TRINTELLIX) 10 MG TABS tablet, Take 3 tablets (30 mg total) by mouth daily., Disp: 270 tablet, Rfl: 1   Zinc 50 MG CAPS, Take 1 capsule by mouth daily., Disp: , Rfl:    predniSONE (DELTASONE) 10 MG tablet, Take 1 tablet (10 mg total) by mouth daily with breakfast., Disp: 100 tablet, Rfl: 3  PAST MEDICAL HISTORY: Past Medical History:  Diagnosis Date   Anxiety    Blind right eye 2009   swelling & pressure   Cluster headaches    started after seizure and migraines   Complication of anesthesia    trouble with short term memory after surgeries   COVID-19    Depression    Fibroids    s/p TLH, bilateral salpingectomy   GERD (gastroesophageal reflux disease)    patient thinks due to medications   History of anemia    prior to hysterectomy   History of pneumonia    History of trichomoniasis    Seizures ([place]) x 1 2-3 yrs ago none since   once saw neurologist, full body brain MRI and another 6 months lster   Status post right oophorectomy    Thrombocytopenia ([place])    sees [place] with Heme Onc    PAST  SURGICAL HISTORY: Past Surgical History:  Procedure Laterality Date   CYSTOSCOPY N/A 07/31/2017   Procedure: CYSTOSCOPY;  Surgeon: Megan Salon, MD;  Location: [facility];  Service: Gynecology;  Laterality: N/A;   ENDOMETRIAL ABLATION     EYE SURGERY     laser - removed blood from right eye   HYSTEROSCOPY WITH D & C     LAPAROSCOPY N/A 09/19/2017   Procedure: LAPAROSCOPY DIAGNOSTIC WITH RIGHT OOPHERECTOMY,  LYSIS OF ADHESIONS;  Surgeon: Salvadore Dom, MD;  Location: [facility];  Service: Gynecology;  Laterality: N/A;   OMENTECTOMY N/A 11/21/2017   Procedure: OMENTECTOMY AND PELVIC WASHINGS;  Surgeon: Isabel Caprice, MD;  Location: [facility];  Service: Gynecology;  Laterality: N/A;   ROBOTIC ASSISTED SALPINGO OOPHERECTOMY Left 11/21/2017   Procedure: XI ROBOTIC ASSISTED LEFT SALPINGO OOPHORECTOMY;  Surgeon: Isabel Caprice, MD;  Location: [facility];  Service: Gynecology;  Laterality: Left;   TOTAL LAPAROSCOPIC HYSTERECTOMY WITH SALPINGECTOMY Bilateral 07/31/2017   Procedure: TOTAL LAPAROSCOPIC HYSTERECTOMY WITH SALPINGECTOMY;  Surgeon: Megan Salon, MD;  Location: [facility];  Service: Gynecology;  Laterality: Bilateral;  20 week size uterus/ Alexis bag in room/ need 4.5 hours   TUBAL LIGATION     WISDOM TOOTH EXTRACTION      FAMILY HISTORY: Family History  Problem Relation Age of Onset   Alcoholism Father    Cirrhosis Father    Alcohol abuse Father    Cancer Maternal Grandmother        unsure of type; died of other causes   Heart disease Maternal Aunt    Drug abuse Cousin     SOCIAL HISTORY:  Social History   Socioeconomic History   Marital status: Single    Spouse name: Not on file   Number of children: Not on file   Years of education: Not on file   Highest education level: Not on file  Occupational History   Not on file  Tobacco Use   Smoking status: Former Smoker    Years: 20.00    Types: Cigarettes, E-cigarettes    Quit date: 05/05/2018    Years since  quitting: 1.5   Smokeless tobacco: Never Used  Vaping Use   Vaping Use: Former  Substance and Sexual Activity   Alcohol use: Not Currently   Drug use: No   Sexual activity: Yes    Birth control/protection: Surgical    Comment: hysterectomy  Other Topics Concern   Not on file  Social History Narrative   Not on file   Social Determinants of Health   Financial Resource Strain:    Difficulty of Paying Living Expenses: Not on file  Food Insecurity:    Worried About Charity fundraiser in the Last Year: Not on file   [employer] of Food in the Last Year: Not on file  Transportation Needs:    Lack of Transportation (Medical): Not on file   Lack of Transportation (Non-Medical): Not on file  Physical Activity:    Days of Exercise per Week: Not on file   Minutes of Exercise per Session: Not on file  Stress:    Feeling of Stress : Not on file  Social Connections:    Frequency of Communication with Friends and Family: Not on file   Frequency of Social Gatherings with Friends and Family: Not on file   Attends Religious Services: Not on file   Active Member of Clubs or Organizations: Not on file   Attends Archivist Meetings: Not on file   Marital Status: Not on file  Intimate Partner Violence:    Fear of Current or Ex-Partner: Not on file   Emotionally Abused: Not on file   Physically Abused: Not on file   Sexually Abused: Not on file     PHYSICAL EXAM  Vitals:   12/08/19 1337  BP: 131/81  Pulse: 77  Weight: 205 lb 8 oz (93.2 kg)  Height: 4' 11.5" (1.511 m)  Body mass index is 40.81 kg/m.   General: The patient is well-developed and well-nourished and in no acute distress  Eyes:   She has conjunctival erythema on the right..  Normal funduscopic examination.  Neck: The neck is tender over the right occiput and splenius capitis muscle.  Range of motion is fairly normal  Neurologic Exam  Mental status: The patient is alert and  oriented x 3 at the time of the examination. The patient has apparent normal recent and remote memory, with an apparently normal attention span and concentration ability.   Speech is normal.  Cranial nerves: Extraocular movements are full.  She has very mild right ptosis and facial strength is normal elsewhere.  There is normal facial sensation to soft touch.   Trapezius and sternocleidomastoid strength is normal. No dysarthria is noted.  The tongue is midline, and the patient has symmetric elevation of the soft palate. No obvious hearing deficits are noted.  Motor:  Muscle bulk is normal.   Tone is normal. Strength is  5 / 5 in all 4 extremities.   Sensory: She has normal sensation to touch and vibration in the arms and legs   Gait and station: Station is normal.   Gait and the tandem gait are normal.  Romberg is negative.    DIAGNOSTIC DATA (LABS, IMAGING, TESTING) - I reviewed patient records, labs, notes, testing and imaging myself where available.  Lab Results  Component Value Date   WBC 8.8 07/10/2018   HGB 14.4 07/10/2018   HCT 42.8 07/10/2018   MCV 87.9 07/10/2018   PLT 59 (L) 07/10/2018      Component Value Date/Time   NA 143 02/04/2019 0902   NA 138 08/31/2016 0957   K 4.0 02/04/2019 0902   K 3.9 08/31/2016 0957   CL 105 02/04/2019 0902   CO2 24 02/04/2019 0902   CO2 22 08/31/2016 0957   GLUCOSE 116 (H) 02/04/2019 0902   GLUCOSE 148 (H) 07/10/2018 0915   GLUCOSE 106 08/31/2016 0957   BUN 11 02/04/2019 0902   BUN 8.6 08/31/2016 0957   CREATININE 0.61 02/04/2019 0902   CREATININE 0.8 08/31/2016 0957   CALCIUM 9.6 02/04/2019 0902   CALCIUM 10.3 08/31/2016 0957   PROT 7.2 02/04/2019 0902   PROT 7.6 08/31/2016 0957   ALBUMIN 4.5 02/04/2019 0902   ALBUMIN 3.9 08/31/2016 0957   AST 16 02/04/2019 0902   AST 18 08/31/2016 0957   ALT 28 02/04/2019 0902   ALT 23 08/31/2016 0957   ALKPHOS 141 (H) 02/04/2019 0902   ALKPHOS 62 08/31/2016 0957   BILITOT 0.2 02/04/2019  0902   BILITOT 0.43 08/31/2016 0957   GFRNONAA 111 02/04/2019 0902   GFRAA 128 02/04/2019 0902        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HCC)  Seizure (HCC)  Bilateral hand numbness - Plan: NCV with EMG(electromyography)  Pain in both hands - Plan: NCV with EMG(electromyography)  Vascular headache  Other vascular headache   1.   She will continue Vimpat for probable seizures.   Continue Trintellix/clonazepam for mood 2.   Prednisone 10 mg daily.  We discussed that if she has trouble tolerating this I will add a steroid sparing agent (Imuran or CellCept) 3.   Continue medications for migraines.   4.   Return in 6 months.  She will call if she has new or worsening symptoms.     A. Felecia Shelling, MD, PhD 83/08/6292, 7:65 PM Certified in Neurology, Clinical Neurophysiology,  Sleep Medicine, Pain Medicine and Neuroimaging   [facility] [address] [place], [place] [address] [phone]

## 2019-12-21 ENCOUNTER — Other Ambulatory Visit (HOSPITAL_COMMUNITY): Payer: Self-pay | Admitting: Psychiatry

## 2020-01-02 ENCOUNTER — Other Ambulatory Visit: Payer: Self-pay

## 2020-01-02 ENCOUNTER — Telehealth (INDEPENDENT_AMBULATORY_CARE_PROVIDER_SITE_OTHER): Payer: No Typology Code available for payment source | Admitting: Psychiatry

## 2020-01-02 DIAGNOSIS — F411 Generalized anxiety disorder: Secondary | ICD-10-CM | POA: Diagnosis not present

## 2020-01-02 DIAGNOSIS — F324 Major depressive disorder, single episode, in partial remission: Secondary | ICD-10-CM

## 2020-01-02 DIAGNOSIS — F410 Panic disorder [episodic paroxysmal anxiety] without agoraphobia: Secondary | ICD-10-CM | POA: Diagnosis not present

## 2020-01-02 MED ORDER — CLONAZEPAM 1 MG PO TABS: 1.0000 mg | ORAL_TABLET | Freq: Two times a day (BID) | ORAL | 0 refills | Status: DC | PRN
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
## 2020-01-20 ENCOUNTER — Ambulatory Visit (INDEPENDENT_AMBULATORY_CARE_PROVIDER_SITE_OTHER): Payer: No Typology Code available for payment source | Admitting: Neurology

## 2020-01-20 ENCOUNTER — Encounter: Payer: No Typology Code available for payment source | Admitting: Neurology

## 2020-01-20 ENCOUNTER — Other Ambulatory Visit: Payer: Self-pay

## 2020-01-20 ENCOUNTER — Encounter: Payer: Self-pay | Admitting: Neurology

## 2020-01-20 DIAGNOSIS — R200 Anesthesia of skin: Secondary | ICD-10-CM | POA: Diagnosis not present

## 2020-01-20 DIAGNOSIS — I776 Arteritis, unspecified: Secondary | ICD-10-CM | POA: Diagnosis not present

## 2020-01-20 DIAGNOSIS — M79641 Pain in right hand: Secondary | ICD-10-CM | POA: Diagnosis not present

## 2020-01-20 DIAGNOSIS — L600 Ingrowing nail: Secondary | ICD-10-CM | POA: Insufficient documentation

## 2020-01-20 DIAGNOSIS — Z0289 Encounter for other administrative examinations: Secondary | ICD-10-CM

## 2020-01-20 DIAGNOSIS — M79642 Pain in left hand: Secondary | ICD-10-CM | POA: Diagnosis not present

## 2020-01-20 NOTE — Progress Notes (Signed)
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  This bothers her more at night when the numbness becomes more painful.  On exam, she had normal strength in both hands.  She had mildly reduced sensation over the thenar eminence on the right.  There is a Tinel's sign on the right.  Nerve conduction studies:  The right median, right ulnar and left median motor responses had normal distal latencies, amplitudes and conduction velocities.  The right median sensory response had a borderline delayed peak latency with normal amplitude though the median palmar response was clearly delayed across the wrist.  The right ulnar and left median sensory responses had normal peak latencies and amplitudes.  Electromyography:  Needle EMG of selected muscles of the right arm and hand was performed.  There was no abnormal spontaneous activity.  Motor unit morphology and recruitment was normal in all of the muscles tested.  Impression: This NCV/EMG study shows the following: 1.   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 2.   No evidence of significant radiculopathy. A. Felecia Shelling, MD, PhD, FAAN Certified in Neurology, Clinical Neurophysiology, Sleep Medicine, Pain Medicine and Neuroimaging Director, [place] [facility] [address] [place], [place] [address] [phone]  Verbal informed consent was obtained from the patient, patient was informed of potential risk of procedure, including bruising, bleeding, hematoma formation, infection, muscle weakness, muscle pain,  numbness, among others.         [place]    Nerve / Sites Muscle Latency Ref. Amplitude Ref. Rel Amp Segments Distance Velocity Ref. Area    ms ms mV mV %  cm m/s m/s mVms  L Median - APB     Wrist APB 3.7 ?4.4 7.4 ?4.0 100 Wrist - APB 7   22.2     Upper arm APB 7.0  7.4  99.2 Upper arm - Wrist 19 57 ?49 21.9  R Median - APB     Wrist APB 4.1 ?4.4 4.3 ?4.0 100 Wrist - APB 7   12.4     Upper arm APB 7.8  3.2  73.9 Upper arm - Wrist 20 55 ?49 10.0  R Ulnar - ADM     Wrist ADM 2.3 ?3.3 10.0 ?6.0 100 Wrist - ADM 7   25.5     B.Elbow ADM 5.1  6.7  67.2 B.Elbow - Wrist 17 60 ?49 19.7     A.Elbow ADM 6.8  7.8  116 A.Elbow - B.Elbow 10 59 ?49 22.4         A.Elbow - Wrist               SNC    Nerve / Sites Rec. Site Peak Lat Ref.  Amp Ref. Segments Distance Peak Diff Ref.    ms ms V V  cm ms ms  R Median, Ulnar - Transcarpal comparison     Median Palm Wrist 2.5 ?2.2 67 ?35 Median Palm - Wrist 8       Ulnar Palm Wrist 1.9 ?2.2 60 ?12 Ulnar Palm - Wrist 8  Median Palm - Ulnar Palm  0.6 ?0.4  L Median - Orthodromic (Dig II, Mid palm)     Dig II Wrist 2.9 ?3.4 20 ?10 Dig II - Wrist 13    R Median - Orthodromic (Dig II, Mid palm)     Dig II Wrist 3.4 ?3.4 20 ?10 Dig II - Wrist 13    R Ulnar - Orthodromic, (Dig V, Mid palm)     Dig V Wrist 2.5 ?3.1 11 ?5 Dig V - Wrist 86               F  Wave    Nerve F Lat Ref.   ms ms  R Ulnar - ADM 24.2 ?32.0       EMG Summary Table    Spontaneous MUAP Recruitment  Muscle IA Fib PSW Fasc Other Amp Dur. Poly Pattern  R. Deltoid Normal None None None _______ Normal Normal Normal Normal  R. Triceps brachii Normal None None None _______ Normal Normal Normal Normal  R. Biceps brachii Normal None None None _______ Normal Normal Normal Normal  R. Extensor digitorum communis Normal None None None _______ Normal Normal Normal Normal  R. First dorsal interosseous Normal None None None _______ Normal Normal Normal Normal  R. Abductor pollicis brevis  Normal None None None _______ Normal Normal Normal Normal          [facility]  PATIENT: Angel Rodriguez DOB: 29-Oct-1975  REFERRING DOCTOR OR PCP:   SOURCE: patient, ED records, images on PACS, reports in EMR  _________________________________   HISTORICAL  CHIEF COMPLAINT:  Chief Complaint  Patient presents with  . Numbness    HISTORY OF PRESENT ILLNESS:  Angel Rodriguez is a 44 y.o. woman with likely left hand numbness, frequent headaches and h/o seizure.  Update 01/20/2020: She reports right greater than left hand numbness.  Sometimes the numbness becomes painful, especially at night.  Shaking hands help  She feels her headaches are doing better since starting the prednisone.  There is less erythema of the right eye as well.  She denies any visual changes.   She has no recent seizure.   Her last one was April 2021.  She is on Vimpat.    She is on Trintellix and clonazepam for anxiety.   Mood has done better with improved pain.  Vasculitis?  History: She has had fluctuating headaches, right visual changes and abnormal MRI with fluctuating lesions with variable enhancement since 2017.  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  She had a second opinion from [place].  Changes in her seizure medications were made but no opinion about the possibility of vasculitis.  She was placed on prednisone 60 mg and titrating down in July 2021.  She had improvement of headaches and other symptoms.  MRI 10/15/2019 showed:   "Resolution of a region of T2 and FLAIR signal within the posterior frontal cortical and subcortical brain on the study of May. One could question slight increased prominence of a T2 and FLAIR focus without enhancement in the subcortical brain at the bottom of a left frontal sulcus, axial FLAIR image 19. No other new or potentially progressive finding. Minimal small foci of cortical and subcortical enhancement in the left frontal region. I think  the differential diagnosis for this case remains that of a vasculitis syndrome, venous thrombotic syndrome, autoimmune syndrome or demyelinating syndrome. I do not think we are dealing with tumor or arterial infarctions. "  MRI brain 07/06/2018  personally reviewed and compared to studies from January 2020.  It shows 2 foci in the anterior left frontal lobe.  One was mildly increased in size compared to the January MRI and one was decreased in size.  Another focus seen on the previous MRI in the left parasagittal frontal lobe has resolved.  MRI of the cervical and thoracic spine 03/25/2018 showed normal spinal cord and mild cervical degenerative changes.  Labs: CSF 03/25/2018 showed 10 white blood cells (lymphocytes), 3 red cells, normal glucose, protein oligoclonal bands, VDRL.  In 03/2018, negative ANA, cANCA and pANCA, weakly positive ANCA proteinase 3, normal ACE level. ESR 10.  HIV was negative 09/22/2017.  Hepatitis C was negative 04/2016  She has a right central vein occlusion followed at [facility].  Seizure:   In March 2016, she had a seizure.   She had an aura of numbness in her right hand and then passed out.   She is not sure there was any generalized tonic-clonic activity. Her children did not tell her that she had any. However, she did have urinary incontinence. She does not remember much that day.    Since then, she has not had any more seizures.  She has not needed any AED as she had only the one seizure/spell.        REVIEW OF SYSTEMS: Constitutional: No fevers, chills, sweats, or change in appetite.  She has insomnia. Eyes: as above Ear, nose and throat: No hearing loss, ear pain, nasal congestion, sore throat Cardiovascular: No chest pain, palpitations Respiratory: No shortness of breath at rest or with exertion.   No wheezes GastrointestinaI: No nausea, vomiting, diarrhea, abdominal pain, fecal incontinence Genitourinary: No dysuria, urinary retention or frequency.  No nocturia.    Possible ovarian cancer Musculoskeletal: No neck pain, back pain Integumentary: No rash, pruritus, skin lesions Neurological: as above Psychiatric: No depression at this time.  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 Allergic/Immunologic: No itchy/runny eyes, nasal congestion, recent allergic reactions, rashes  ALLERGIES: Allergies  Allergen Reactions  . Ivp Dye [Iodinated Diagnostic Agents] Hives, Itching and Swelling  . Bee Venom Swelling    HOME MEDICATIONS:  Current Outpatient Medications:  .  acetaminophen (TYLENOL) 500 MG tablet, Take 500 mg by mouth every 6 (six) hours as needed for headache (pain)., Disp: , Rfl:  .  Acetaminophen-Codeine 300-30 MG tablet, ONE TO TWO AS NEEDED FOR HEADACHE. NO MORE THAN 4/DAY, Disp: 28 tablet, Rfl: 3 .  Ascorbic Acid (VITAMIN C) 1000 MG tablet, Take 1,000 mg by mouth daily., Disp: , Rfl:  .  atropine 1 % ophthalmic solution, Place 1 drop into the right eye 3 (three) times daily. , Disp: , Rfl:  .  brimonidine (ALPHAGAN P) 0.1 % SOLN, Place 1 drop into the right eye 3 (three) times daily. , Disp: , Rfl:  .  calcium carbonate (TUMS EX) 750 MG chewable tablet, Chew 2 tablets by mouth daily as needed for heartburn. , Disp: , Rfl:  .  clonazePAM (KLONOPIN) 1 MG tablet, Take 1 tablet (1 mg total) by mouth 2 (two) times daily as needed for anxiety (sleep)., Disp: 180 tablet, Rfl: 0 .  dorzolamide-timolol (COSOPT) 22.3-6.8 MG/ML ophthalmic solution, Place 1 drop into the right eye 2 (two) times daily. , Disp: , Rfl:  .  fluticasone (FLONASE) 50 MCG/ACT nasal spray, Place  1-2 sprays into both nostrils daily as needed for allergies. , Disp: , Rfl:  .  Lacosamide 100 MG TABS, Take 1 tablet (100 mg total) by mouth 2 (two) times daily., Disp: 180  tablet, Rfl: 1 .  latanoprost (XALATAN) 0.005 % ophthalmic solution, Place 1 drop into both eyes at bedtime., Disp: , Rfl:  .  nystatin cream (MYCOSTATIN), Apply 1 application topically 2 (two) times daily. Apply to affected area BID for up to 7 days., Disp: 30 g, Rfl: 1 .  predniSONE (DELTASONE) 10 MG tablet, Take 1 tablet (10 mg total) by mouth daily with breakfast., Disp: 100 tablet, Rfl: 3 .  SUMAtriptan (IMITREX) 100 MG tablet, Take 1 tablet (100 mg total) by mouth once as needed for migraine. May repeat in 2 hours if headache persists or recurs., Disp: 18 tablet, Rfl: 3 .  verapamil (CALAN-SR) 240 MG CR tablet, TAKE 1 TABLET BY MOUTH AT BEDTIME., Disp: 90 tablet, Rfl: 3 .  VITAMIN D PO, Take 500 mg by mouth daily., Disp: , Rfl:  .  Vitamin D, Ergocalciferol, (DRISDOL) 1.25 MG (50000 UT) CAPS capsule, Take 1 capsule (50,000 Units total) by mouth every 7 (seven) days. (Patient taking differently: Take 50,000 Units by mouth every Saturday. ), Disp: 12 capsule, Rfl: 1 .  vortioxetine HBr (TRINTELLIX) 10 MG TABS tablet, Take 3 tablets (30 mg total) by mouth daily., Disp: 270 tablet, Rfl: 1 .  Zinc 50 MG CAPS, Take 1 capsule by mouth daily., Disp: , Rfl:   PAST MEDICAL HISTORY: Past Medical History:  Diagnosis Date  . Anxiety   . 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 Cluster headaches    started after seizure and migraines  . Complication of anesthesia    trouble with short term memory after surgeries  . COVID-19   . Depression   . Fibroids    s/p TLH, bilateral salpingectomy  . GERD (gastroesophageal reflux disease)    patient thinks due to medications  . History of anemia    prior to hysterectomy  . History of pneumonia   . History of trichomoniasis   . Seizures ([place]) x 1 2-3 yrs ago none since   once saw neurologist, full body brain MRI and another 6 months lster  . Status post right oophorectomy   . Thrombocytopenia ([place])    sees [place] with Heme Onc    PAST SURGICAL  HISTORY: Past Surgical History:  Procedure Laterality Date  . CYSTOSCOPY N/A 07/31/2017   Procedure: CYSTOSCOPY;  Surgeon: Megan Salon, MD;  Location: [facility];  Service: Gynecology;  Laterality: N/A;  . ENDOMETRIAL ABLATION    . EYE SURGERY     laser - removed blood from right eye  . HYSTEROSCOPY WITH D & C    . LAPAROSCOPY N/A 09/19/2017   Procedure: LAPAROSCOPY DIAGNOSTIC WITH RIGHT OOPHERECTOMY, LYSIS OF ADHESIONS;  Surgeon: Salvadore Dom, MD;  Location: [facility];  Service: Gynecology;  Laterality: N/A;  . OMENTECTOMY N/A 11/21/2017   Procedure: OMENTECTOMY AND PELVIC WASHINGS;  Surgeon: Isabel Caprice, MD;  Location: [facility];  Service: Gynecology;  Laterality: N/A;  . ROBOTIC ASSISTED SALPINGO OOPHERECTOMY Left 11/21/2017   Procedure: XI ROBOTIC ASSISTED LEFT SALPINGO OOPHORECTOMY;  Surgeon: Isabel Caprice, MD;  Location: [facility];  Service: Gynecology;  Laterality: Left;  . TOTAL LAPAROSCOPIC HYSTERECTOMY WITH SALPINGECTOMY Bilateral 07/31/2017   Procedure: TOTAL LAPAROSCOPIC HYSTERECTOMY WITH SALPINGECTOMY;  Surgeon: Megan Salon, MD;  Location: [facility];  Service: Gynecology;  Laterality: Bilateral;  20 week size uterus/ Alexis bag in room/ need 4.5 hours  . TUBAL LIGATION    . WISDOM TOOTH EXTRACTION      FAMILY HISTORY: Family History  Problem Relation Age of Onset  . Alcoholism Father   . Cirrhosis Father   . Alcohol abuse Father   . Cancer Maternal Grandmother        unsure of type; died of other causes  . Heart disease Maternal Aunt   . Drug abuse Cousin       PHYSICAL EXAM  There were no vitals filed for this visit.  There is no height or weight on file to calculate BMI.   General: The patient is well-developed and well-nourished and in no acute distress  Eyes:   She has only minimal conjunctival erythema on the right..   Neck: The neck is mildly tender over the right occiput and splenius capitis muscle.  Range of motion is fairly normal  Extremities:    She has an ingrown toenail with some surrounding erythema involving the left great toe  Neurologic Exam  Mental status: The patient is alert and oriented x 3 at the time of the examination. The patient has apparent normal recent and remote memory, with an apparently normal attention span and concentration ability.   Speech is normal.  Cranial nerves: Extraocular movements are full.  She has very mild right ptosis and facial strength is normal elsewhere.  There is normal facial sensation to soft touch.    Motor:  Muscle bulk is normal.   Tone is normal. Strength is  5 / 5 in all 4 extremities.   Sensory: She has mildly reduced sensation over the thenar eminence of the right palm.  Sensation was normal elsewhere in the hands.  She has a Tinel's sign at the right wrist.   Gait and station: Station and gait are normal    DIAGNOSTIC DATA (LABS, IMAGING, TESTING) - I reviewed patient records, labs, notes, testing and imaging myself where available.  Lab Results  Component Value Date   WBC 8.8 07/10/2018   HGB 14.4 07/10/2018   HCT 42.8 07/10/2018   MCV 87.9 07/10/2018   PLT 59 (L) 07/10/2018      Component Value Date/Time   NA 143 02/04/2019 0902   NA 138 08/31/2016 0957   K 4.0 02/04/2019 0902   K 3.9 08/31/2016 0957   CL 105 02/04/2019 0902   CO2 24 02/04/2019 0902   CO2 22 08/31/2016 0957   GLUCOSE 116 (H) 02/04/2019 0902   GLUCOSE 148 (H) 07/10/2018 0915   GLUCOSE 106 08/31/2016 0957   BUN 11 02/04/2019 0902   BUN 8.6 08/31/2016 0957   CREATININE 0.61 02/04/2019 0902   CREATININE 0.8 08/31/2016 0957   CALCIUM 9.6 02/04/2019 0902   CALCIUM 10.3 08/31/2016 0957   PROT 7.2 02/04/2019 0902   PROT 7.6 08/31/2016 0957   ALBUMIN 4.5 02/04/2019 0902   ALBUMIN 3.9 08/31/2016 0957   AST 16 02/04/2019 0902   AST 18 08/31/2016 0957   ALT 28 02/04/2019 0902   ALT 23 08/31/2016 0957   ALKPHOS 141 (H) 02/04/2019 0902   ALKPHOS 62 08/31/2016 0957   BILITOT 0.2 02/04/2019 0902    BILITOT 0.43 08/31/2016 0957   GFRNONAA 111 02/04/2019 0902   GFRAA 128 02/04/2019 0902        _________________________________________________________  ASSESSMENT AND PLAN  Bilateral hand numbness - Plan: NCV with EMG(electromyography)  Pain in both hands -  Plan: NCV with EMG(electromyography)  Ingrowing toenail of left foot - Plan: Ambulatory referral to Podiatry  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   Continue Vimpat for probable seizures.   Continue Trintellix/clonazepam for mood 2.   Continue Prednisone 10 mg daily.  We discussed that if she has trouble tolerating this I will add a steroid sparing agent (Imuran or CellCept) 3.   We discussed obtaining a carpal tunnel splint for the right wrist to wear at night..   4.   Referral to podiatry for left ingrown toenail  5.   return in 6 months.  She will call if she has new or worsening symptoms.     A. Felecia Shelling, MD, PhD 31/51/7616, 07:37 AM Certified in Neurology, Clinical Neurophysiology, Sleep Medicine, Pain Medicine and Neuroimaging   [facility] [address], [place], [place] [address] [phone]

## 2020-01-22 ENCOUNTER — Ambulatory Visit: Payer: No Typology Code available for payment source | Admitting: Podiatry

## 2020-01-22 ENCOUNTER — Encounter: Payer: Self-pay | Admitting: Podiatry

## 2020-01-22 ENCOUNTER — Ambulatory Visit (INDEPENDENT_AMBULATORY_CARE_PROVIDER_SITE_OTHER): Payer: No Typology Code available for payment source

## 2020-01-22 ENCOUNTER — Other Ambulatory Visit: Payer: Self-pay

## 2020-01-22 DIAGNOSIS — S99922A Unspecified injury of left foot, initial encounter: Secondary | ICD-10-CM

## 2020-01-22 DIAGNOSIS — L600 Ingrowing nail: Secondary | ICD-10-CM

## 2020-01-22 MED ORDER — NEOMYCIN-POLYMYXIN-HC 3.5-10000-1 OT SOLN: OTIC | 1 refills | Status: DC

## 2020-01-22 NOTE — Patient Instructions (Signed)
Place 1/4 cup of epsom salts in a quart of warm tap water.  Submerge your foot or feet in the solution and soak for 20 minutes.  This soak should be done twice a day.  Next, remove your foot or feet from solution, blot dry the affected area. Apply ointment and cover if instructed by your doctor.   IF YOUR SKIN BECOMES IRRITATED WHILE USING THESE INSTRUCTIONS, IT IS OKAY TO SWITCH TO  WHITE VINEGAR AND WATER.  As another alternative soak, you may use antibacterial soap and water.  Monitor for any signs/symptoms of infection. Call the office immediately if any occur or go directly to the emergency room. Call with any questions/concerns.  Ingrown Toenail An ingrown toenail occurs when the corner or sides of a toenail grow into the surrounding skin. This causes discomfort and pain. The big toe is most commonly affected, but any of the toes can be affected. If an ingrown toenail is not treated, it can become infected. What are the causes? This condition may be caused by:  Wearing shoes that are too small or tight.  An injury, such as stubbing your toe or having your toe stepped on.  Improper cutting or care of your toenails.  Having nail or foot abnormalities that were present from birth (congenital abnormalities), such as having a nail that is too big for your toe. What increases the risk? The following factors may make you more likely to develop ingrown toenails:  Age. Nails tend to get thicker with age, so ingrown nails are more common among older people.  Cutting your toenails incorrectly, such as cutting them very short or cutting them unevenly. An ingrown toenail is more likely to get infected if you have:  Diabetes.  Blood flow (circulation) problems. What are the signs or symptoms? Symptoms of an ingrown toenail may include:  Pain, soreness, or tenderness.  Redness.  Swelling.  Hardening of the skin that surrounds the toenail. Signs that an ingrown toenail may be infected  include:  Fluid or pus.  Symptoms that get worse instead of better. How is this diagnosed? An ingrown toenail may be diagnosed based on your medical history, your symptoms, and a physical exam. If you have fluid or blood coming from your toenail, a sample may be collected to test for the specific type of bacteria that is causing the infection. How is this treated? Treatment depends on how severe your ingrown toenail is. You may be able to care for your toenail at home.  If you have an infection, you may be prescribed antibiotic medicines.  If you have fluid or pus draining from your toenail, your health care provider may drain it.  If you have trouble walking, you may be given crutches to use.  If you have a severe or infected ingrown toenail, you may need a procedure to remove part or all of the nail. Follow these instructions at home: Foot care   Do not pick at your toenail or try to remove it yourself.  Soak your foot in warm, soapy water. Do this for 20 minutes, 3 times a day, or as often as told by your health care provider. This helps to keep your toe clean and keep your skin soft.  Wear shoes that fit well and are not too tight. Your health care provider may recommend that you wear open-toed shoes while you heal.  Trim your toenails regularly and carefully. Cut your toenails straight across to prevent injury to the skin at the   corners of the toenail. Do not cut your nails in a curved shape.  Keep your feet clean and dry to help prevent infection. Medicines  Take over-the-counter and prescription medicines only as told by your health care provider.  If you were prescribed an antibiotic, take it as told by your health care provider. Do not stop taking the antibiotic even if you start to feel better. Activity  Return to your normal activities as told by your health care provider. Ask your health care provider what activities are safe for you.  Avoid activities that cause  pain. General instructions  If your health care provider told you to use crutches to help you move around, use them as instructed.  Keep all follow-up visits as told by your health care provider. This is important. Contact a health care provider if:  You have more redness, swelling, pain, or other symptoms that do not improve with treatment.  You have fluid, blood, or pus coming from your toenail. Get help right away if:  You have a red streak on your skin that starts at your foot and spreads up your leg.  You have a fever. Summary  An ingrown toenail occurs when the corner or sides of a toenail grow into the surrounding skin. This causes discomfort and pain. The big toe is most commonly affected, but any of the toes can be affected.  If an ingrown toenail is not treated, it can become infected.  Fluid or pus draining from your toenail is a sign of infection. Your health care provider may need to drain it. You may be given antibiotics to treat the infection.  Trimming your toenails regularly and properly can help you prevent an ingrown toenail. This information is not intended to replace advice given to you by your health care provider. Make sure you discuss any questions you have with your health care provider. Document Revised: 06/14/2018 Document Reviewed: 11/08/2016 Elsevier Patient Education  2020 [employer]  

## 2020-01-22 NOTE — Progress Notes (Signed)
Subjective:   Patient ID: Angel Rodriguez, female   DOB: 44 y.o.   MRN: [id number]   HPI Patient states she has a very painful left big toenail and second nail that are hard for her to cut and states it was red and draining but now is just very painful.  Patient states that she is tried to soak it and trim it herself without relief and patient does not currently smoke and likes to be active   Review of Systems  All other systems reviewed and are negative.       Objective:  Physical Exam Vitals and nursing note reviewed.  Constitutional:      Appearance: She is well-developed.  Pulmonary:     Effort: Pulmonary effort is normal.  Musculoskeletal:        General: Normal range of motion.  Skin:    General: Skin is warm.  Neurological:     Mental Status: She is alert.     Neurovascular status found to be intact muscle strength was found to be adequate range of motion adequate.  Patient is found to have incurvated left hallux medial border left second nail medial border with mild discomfort also in the right.  There is slight redness around the left big toe but it is localized with no proximal edema erythema drainage noted patient was found to have good digital perfusion well oriented x3     Assessment:  Ingrown toenail deformity left hallux second toe very painful when pressed no indications currently of infection     Plan:  H&P reviewed condition and explained causes to patient.  As because the left toe had some swelling I took precautionary measure and precautionary x-ray.  I discussed nail removal of the borders I explained procedure risk and patient wants surgery signed consent form.  Today I infiltrated each toe 60 mg like Marcaine mixture sterile prep applied to each digit and using sterile instrumentation I remove the medial border left hallux left second toe.  I exposed matrix applied phenol 3 applications of 30 seconds each followed by alcohol lavage sterile dressing gave  instructions on soaks and applied sterile dressing and instructed on leaving them on approximate 24 hours but taking them off earlier if any swelling or drainage were to occur.  Patient is encouraged to call with questions concerns.  X-rays indicate no signs that there is any osteolysis or any signs of bone pathology associated with the discomfort she is experiencing

## 2020-02-06 ENCOUNTER — Other Ambulatory Visit (HOSPITAL_COMMUNITY): Payer: Self-pay | Admitting: Psychiatry

## 2020-03-01 ENCOUNTER — Ambulatory Visit
Admission: EM | Admit: 2020-03-01 | Discharge: 2020-03-01 | Disposition: A | Discharge status: Home / Self Care | Payer: PRIVATE HEALTH INSURANCE | Attending: Emergency Medicine | Admitting: Emergency Medicine

## 2020-03-01 ENCOUNTER — Other Ambulatory Visit: Payer: Self-pay

## 2020-03-01 DIAGNOSIS — M545 Low back pain, unspecified: Secondary | ICD-10-CM

## 2020-03-01 LAB — POCT URINALYSIS DIP (MANUAL ENTRY)
Bilirubin, UA: NEGATIVE
Glucose, UA: NEGATIVE mg/dL
Ketones, POC UA: NEGATIVE mg/dL
Leukocytes, UA: NEGATIVE
Nitrite, UA: NEGATIVE
Protein Ur, POC: 30 mg/dL — AB
Spec Grav, UA: 1.025 (ref 1.010–1.025)
Urobilinogen, UA: 0.2 E.U./dL
pH, UA: 6.5 (ref 5.0–8.0)

## 2020-03-01 MED ORDER — KETOROLAC TROMETHAMINE 30 MG/ML IJ SOLN
30.0000 mg | Freq: Once | INTRAMUSCULAR | Status: AC
  Administered 2020-03-01: 30 mg via INTRAMUSCULAR

## 2020-03-01 MED ORDER — CYCLOBENZAPRINE HCL 5 MG PO TABS: 5.0000 mg | ORAL_TABLET | Freq: Two times a day (BID) | ORAL | 0 refills | Status: AC | PRN

## 2020-03-01 NOTE — ED Provider Notes (Signed)
[facility]    CSN: [id number] Arrival date & time: 03/01/20  1844      History   Chief Complaint Chief Complaint  Patient presents with  . Back Pain    Since this morning    HPI Angel Rodriguez is a 44 y.o. female  Presenting for bilateral low back pain since this morning.  Endorsing mild radiation of left side which is worse.  Denying flank pain, urinary issues or vaginal discharge.  Denies trauma, saddle or anesthesia, urinary retention or fecal incontinence.  Past Medical History:  Diagnosis Date  . Anxiety   . 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 Cluster headaches    started after seizure and migraines  . Complication of anesthesia    trouble with short term memory after surgeries  . COVID-19   . Depression   . Fibroids    s/p TLH, bilateral salpingectomy  . GERD (gastroesophageal reflux disease)    patient thinks due to medications  . History of anemia    prior to hysterectomy  . History of pneumonia   . History of trichomoniasis   . Seizures ([place]) x 1 2-3 yrs ago none since   once saw neurologist, full body brain MRI and another 6 months lster  . Status post right oophorectomy   . Thrombocytopenia ([place])    sees [place] with Heme Onc    Patient Active Problem List   Diagnosis Date Noted  . Ingrowing toenail of left foot 01/20/2020  . 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 Pain in both hands 12/08/2019  . COVID-19 virus infection 03/06/2019  . GAD (generalized anxiety disorder) 09/03/2018  . 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 Panic disorder 06/06/2018  . Flank pain 05/27/2018  . Acute left-sided low back pain without sciatica 05/27/2018  . Mood disorder ([place]) 05/09/2018  . Mental confusion/ slurred speech 05/09/2018  . High risk medications (not anticoagulants) long-term use 05/09/2018  . Bilateral hand numbness 03/18/2018  . Vitamin D deficiency 03/18/2018  . Thrombocytopenia ([place])  03/14/2018  . Dysarthria 03/14/2018  . Brain lesion 03/14/2018  . Right sided weakness 03/13/2018  . 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 Abnormal MRI of head 01/21/2016  . Vascular headache 09/15/2015  . Insomnia 09/15/2015  . Headache 07/08/2015  . Intractable chronic migraine without aura and with status migrainosus 07/08/2015  . Neck pain 06/24/2015  . Seizure ([place]) 06/24/2015  . Central retinal vein occlusion 12/11/2011  . Cotton wool exudates 12/11/2011  . Cystoid macular edema 12/11/2011    Past Surgical History:  Procedure Laterality Date  . CYSTOSCOPY N/A 07/31/2017   Procedure: CYSTOSCOPY;  Surgeon: Megan Salon, MD;  Location: [facility];  Service: Gynecology;  Laterality: N/A;  . ENDOMETRIAL ABLATION    . EYE SURGERY     laser - removed blood from right eye  . HYSTEROSCOPY WITH D & C    . LAPAROSCOPY N/A 09/19/2017   Procedure: LAPAROSCOPY DIAGNOSTIC WITH RIGHT OOPHERECTOMY, LYSIS OF ADHESIONS;  Surgeon: Salvadore Dom, MD;  Location: [facility];  Service: Gynecology;  Laterality: N/A;  . OMENTECTOMY N/A 11/21/2017   Procedure: OMENTECTOMY AND PELVIC WASHINGS;  Surgeon: Isabel Caprice, MD;  Location: [facility];  Service: Gynecology;  Laterality: N/A;  . ROBOTIC ASSISTED SALPINGO OOPHERECTOMY Left 11/21/2017   Procedure: XI ROBOTIC ASSISTED LEFT SALPINGO OOPHORECTOMY;  Surgeon: Isabel Caprice, MD;  Location: [facility];  Service: Gynecology;  Laterality: Left;  . TOTAL LAPAROSCOPIC HYSTERECTOMY WITH SALPINGECTOMY Bilateral 07/31/2017   Procedure: TOTAL LAPAROSCOPIC HYSTERECTOMY WITH SALPINGECTOMY;  Surgeon: Megan Salon, MD;  Location: [facility];  Service: Gynecology;  Laterality: Bilateral;  20 week size uterus/ Alexis bag in room/ need 4.5 hours  . TUBAL LIGATION    . WISDOM TOOTH EXTRACTION      OB History    Gravida  5   Para  3   Term  3   Preterm  0   AB  2   Living  3     SAB  1   IAB      Ectopic  1   Multiple      Live Births                Home Medications    Prior to Admission medications   Medication Sig Start Date End Date Taking? Authorizing Provider  Acetaminophen-Codeine 300-30 MG tablet ONE TO TWO AS NEEDED FOR HEADACHE. NO MORE THAN 4/DAY 07/02/19  Yes Sater, Nanine Means, MD  calcium carbonate (TUMS EX) 750 MG chewable tablet Chew 2 tablets by mouth daily as needed for heartburn.    Yes [provider]  clonazePAM (KLONOPIN) 1 MG tablet Take 1 tablet (1 mg total) by mouth 2 (two) times daily as needed for anxiety (sleep). 01/02/20 04/01/20 Yes Pucilowski, Olgierd A, MD  cyclobenzaprine (FLEXERIL) 5 MG tablet Take 1 tablet (5 mg total) by mouth 2 (two) times daily as needed for up to 7 days for muscle spasms. 03/01/20 03/08/20 Yes Hall-Potvin, [place], PA-C  fluticasone (FLONASE) 50 MCG/ACT nasal spray Place 1-2 sprays into both nostrils daily as needed for allergies.    Yes [provider]  neomycin-polymyxin-hydrocortisone (CORTISPORIN) OTIC solution Apply 1-2 drops to toe after soaking BID 01/22/20  Yes Regal, Tamala Fothergill, DPM  nystatin cream (MYCOSTATIN) Apply 1 application topically 2 (two) times daily. Apply to affected area BID for up to 7 days. 01/17/19  Yes Megan Salon, MD  predniSONE (DELTASONE) 10 MG tablet Take 1 tablet (10 mg total) by mouth daily with breakfast. 12/08/19  Yes Sater, Nanine Means, MD  SUMAtriptan (IMITREX) 100 MG tablet Take 1 tablet (100 mg total) by mouth once as needed for migraine. May repeat in 2 hours if headache persists or recurs. 01/01/19  Yes Sater, Nanine Means, MD  verapamil (CALAN-SR) 240 MG CR tablet TAKE 1 TABLET BY MOUTH AT BEDTIME. 11/27/19  Yes Sater, Nanine Means, MD  Vitamin D, Ergocalciferol, (DRISDOL) 1.25 MG (50000 UT) CAPS capsule Take 1 capsule (50,000 Units total) by mouth every 7 (seven) days. Patient taking differently: Take 50,000 Units by mouth every Saturday. 03/21/18  Yes Sater, Nanine Means, MD  Zinc 50 MG CAPS Take 1 capsule by mouth daily.   Yes  [provider]  acetaminophen (TYLENOL) 500 MG tablet Take 500 mg by mouth every 6 (six) hours as needed for headache (pain).    [provider]  Ascorbic Acid (VITAMIN C) 1000 MG tablet Take 1,000 mg by mouth daily.    [provider]  atropine 1 % ophthalmic solution Place 1 drop into the right eye 3 (three) times daily.  01/10/16   [provider]  brimonidine (ALPHAGAN P) 0.1 % SOLN Place 1 drop into the right eye 3 (three) times daily.  06/21/15   [provider]  dorzolamide-timolol (COSOPT) 22.3-6.8 MG/ML ophthalmic solution Place 1 drop into the right eye 2 (two) times daily.  06/21/15  [provider]  Lacosamide 100 MG TABS Take 1 tablet (100 mg total) by mouth 2 (two) times daily. 10/30/19 10/29/20  Sater, Nanine Means, MD  latanoprost (XALATAN) 0.005 % ophthalmic solution Place 1 drop into both eyes at bedtime.    [provider]  VITAMIN D PO Take 500 mg by mouth daily.    [provider]  vortioxetine HBr (TRINTELLIX) 10 MG TABS tablet Take 3 tablets (30 mg total) by mouth daily. 10/06/19 04/03/20  Pucilowski, Marchia Bond, MD    Family History Family History  Problem Relation Age of Onset  . Alcoholism Father   . Cirrhosis Father   . Alcohol abuse Father   . Cancer Maternal Grandmother        unsure of type; died of other causes  . Heart disease Maternal Aunt   . Drug abuse Cousin     Social History Social History   Tobacco Use  . Smoking status: Former Smoker    Years: 20.00    Types: Cigarettes, E-cigarettes    Quit date: 05/05/2018    Years since quitting: 1.8  . Smokeless tobacco: Never Used  Vaping Use  . Vaping Use: Former  Substance Use Topics  . Alcohol use: Not Currently  . Drug use: No     Allergies   Ivp dye [iodinated diagnostic agents] and Bee venom   Review of Systems Review of Systems  Constitutional: Negative for fatigue and fever.  HENT: Negative for ear pain, sinus pain, sore  throat and voice change.   Eyes: Negative for pain, redness and visual disturbance.  Respiratory: Negative for cough and shortness of breath.   Cardiovascular: Negative for chest pain and palpitations.  Gastrointestinal: Negative for abdominal pain, diarrhea and vomiting.  Musculoskeletal: Positive for back pain. Negative for arthralgias and myalgias.  Skin: Negative for rash and wound.  Neurological: Negative for syncope and headaches.     Physical Exam Triage Vital Signs ED Triage Vitals [03/01/20 2013]  Enc Vitals Group     BP 112/78     Pulse Rate 84     Resp 20     Temp 97.7 F (36.5 C)     Temp Source Oral     SpO2 93 %     Weight      Height      Head Circumference      Peak Flow      Pain Score      Pain Loc      Pain Edu?      Excl. in [place]?    No data found.  Updated Vital Signs BP 112/78 (BP Location: Left Arm)   Pulse 84   Temp 97.7 F (36.5 C) (Oral)   Resp 20   LMP 05/02/2017   SpO2 93%   Visual Acuity Right Eye Distance:   Left Eye Distance:   Bilateral Distance:    Right Eye Near:   Left Eye Near:    Bilateral Near:     Physical Exam Constitutional:      General: She is not in acute distress. HENT:     Head: Normocephalic and atraumatic.  Eyes:     General: No scleral icterus.    Pupils: Pupils are equal, round, and reactive to light.  Cardiovascular:     Rate and Rhythm: Normal rate.  Pulmonary:     Effort: Pulmonary effort is normal.  Musculoskeletal:        General: Tenderness present. No swelling. Normal range of  motion.     Comments: Bony deformity of C, T, L-spine's.  No PSIS crepitus, tenderness.  Patient does have diffuse left paraspinal and right paraspinal tenderness with worsening TTP on left.    Skin:    Coloration: Skin is not jaundiced or pale.  Neurological:     Mental Status: She is alert and oriented to person, place, and time.      UC Treatments / Results  Labs (all labs ordered are listed, but only abnormal  results are displayed) Labs Reviewed  POCT URINALYSIS DIP (MANUAL ENTRY) - Abnormal; Notable for the following components:      Result Value   Blood, UA trace-intact (*)    Protein Ur, POC =30 (*)    All other components within normal limits    EKG   Radiology No results found.  Procedures Procedures (including critical care time)  Medications Ordered in UC Medications  ketorolac (TORADOL) 30 MG/ML injection 30 mg (has no administration in time range)    Initial Impression / Assessment and Plan / UC Course  I have reviewed the triage vital signs and the nursing notes.  Pertinent labs & imaging results that were available during my care of the patient were reviewed by me and considered in my medical decision making (see chart for details).     Urine as above, no neurocognitive deficit.  Likely MSK.  Will give Toradol, follow-up with sports medicine for persistent or worsening symptoms.  Return precautions discussed, pt verbalized understanding and is agreeable to plan. Final Clinical Impressions(s) / UC Diagnoses   Final diagnoses:  Acute bilateral low back pain without sciatica     Discharge Instructions     RICE: rest, ice, compression, elevation as needed for pain.    Pain medication:  350 mg-1000 mg of Tylenol (acetaminophen) and/or 200 mg - 800 mg of Advil (ibuprofen, Motrin) every 8 hours as needed.  May alternate between the two throughout the day as they are generally safe to take together.  DO NOT exceed more than 3000 mg of Tylenol or 3200 mg of ibuprofen in a 24 hour period as this could damage your stomach, kidneys, liver, or increase your bleeding risk.  May take muscle relaxer as needed for severe pain / spasm.  (This medication may cause you to become tired so it is important you do not drink alcohol or operate heavy machinery while on this medication.  Recommend your first dose to be taken before bedtime to monitor for side effects safely)  Important to  follow up with specialist(s) below for further evaluation/management if your symptoms persist or worsen.    ED Prescriptions    Medication Sig Dispense Auth. Provider   cyclobenzaprine (FLEXERIL) 5 MG tablet Take 1 tablet (5 mg total) by mouth 2 (two) times daily as needed for up to 7 days for muscle spasms. 14 tablet Hall-Potvin, [place], PA-C     I have reviewed the PDMP during this encounter.   Hall-Potvin, [place], [place] 03/01/20 2100

## 2020-03-01 NOTE — Discharge Instructions (Addendum)
RICE: rest, ice, compression, elevation as needed for pain.    Pain medication:  350 mg-1000 mg of Tylenol (acetaminophen) and/or 200 mg - 800 mg of Advil (ibuprofen, Motrin) every 8 hours as needed.  May alternate between the two throughout the day as they are generally safe to take together.  DO NOT exceed more than 3000 mg of Tylenol or 3200 mg of ibuprofen in a 24 hour period as this could damage your stomach, kidneys, liver, or increase your bleeding risk.  May take muscle relaxer as needed for severe pain / spasm.  (This medication may cause you to become tired so it is important you do not drink alcohol or operate heavy machinery while on this medication.  Recommend your first dose to be taken before bedtime to monitor for side effects safely)  Important to follow up with specialist(s) below for further evaluation/management if your symptoms persist or worsen. 

## 2020-03-01 NOTE — ED Triage Notes (Signed)
Patient states she has had back pain worsening since this morning and denies any previous history of lower baCK ISSUES OR INJURIES. Pt is aox4 and ambulatory.

## 2020-03-15 ENCOUNTER — Other Ambulatory Visit: Payer: Self-pay | Admitting: Neurology

## 2020-04-02 ENCOUNTER — Other Ambulatory Visit: Payer: Self-pay

## 2020-04-02 ENCOUNTER — Telehealth (INDEPENDENT_AMBULATORY_CARE_PROVIDER_SITE_OTHER): Payer: 59 | Admitting: Psychiatry

## 2020-04-02 DIAGNOSIS — F410 Panic disorder [episodic paroxysmal anxiety] without agoraphobia: Secondary | ICD-10-CM

## 2020-04-02 DIAGNOSIS — F324 Major depressive disorder, single episode, in partial remission: Secondary | ICD-10-CM | POA: Diagnosis not present

## 2020-04-02 DIAGNOSIS — F411 Generalized anxiety disorder: Secondary | ICD-10-CM

## 2020-04-02 MED ORDER — CLONAZEPAM 1 MG PO TABS: 1.0000 mg | ORAL_TABLET | Freq: Two times a day (BID) | ORAL | 1 refills | Status: DC | PRN

## 2020-04-02 MED ORDER — VORTIOXETINE HBR 10 MG PO TABS: 30.0000 mg | ORAL_TABLET | Freq: Every day | ORAL | 1 refills | Status: AC
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
## 2020-04-22 ENCOUNTER — Other Ambulatory Visit: Payer: Self-pay | Admitting: Neurology

## 2020-04-24 ENCOUNTER — Other Ambulatory Visit: Payer: Self-pay | Admitting: Neurology

## 2020-05-10 ENCOUNTER — Ambulatory Visit: Payer: No Typology Code available for payment source | Admitting: Neurology

## 2020-06-02 ENCOUNTER — Telehealth: Payer: Self-pay | Admitting: Neurology

## 2020-06-02 NOTE — Telephone Encounter (Signed)
Unable to reach pt to r/s 4/7 appointment (MD out). LVM on cell #. Please r/s patient if she calls back.

## 2020-06-03 ENCOUNTER — Other Ambulatory Visit: Payer: Self-pay | Admitting: Neurology

## 2020-06-03 MED ORDER — LACOSAMIDE 100 MG PO TABS: 100.0000 mg | ORAL_TABLET | Freq: Two times a day (BID) | ORAL | 1 refills | Status: DC

## 2020-06-03 NOTE — Addendum Note (Signed)
Addended by: Wyvonnia Lora on: 06/03/2020 08:48 AM   Modules accepted: Orders

## 2020-06-03 NOTE — Telephone Encounter (Signed)
Pt is asking for a refill on her Vimpat (90 day suppy) to [id number]

## 2020-06-10 ENCOUNTER — Ambulatory Visit: Payer: No Typology Code available for payment source | Admitting: Neurology

## 2020-06-30 ENCOUNTER — Ambulatory Visit (HOSPITAL_BASED_OUTPATIENT_CLINIC_OR_DEPARTMENT_OTHER): Payer: PRIVATE HEALTH INSURANCE | Admitting: Obstetrics & Gynecology

## 2020-07-09 ENCOUNTER — Other Ambulatory Visit: Payer: Self-pay

## 2020-07-09 ENCOUNTER — Ambulatory Visit (INDEPENDENT_AMBULATORY_CARE_PROVIDER_SITE_OTHER): Payer: PRIVATE HEALTH INSURANCE | Admitting: Physician Assistant

## 2020-07-09 ENCOUNTER — Encounter: Payer: Self-pay | Admitting: Physician Assistant

## 2020-07-09 VITALS — BP 122/82 | HR 76 | Temp 97.4°F | Ht 60.0 in | Wt 203.1 lb

## 2020-07-09 DIAGNOSIS — Z1211 Encounter for screening for malignant neoplasm of colon: Secondary | ICD-10-CM

## 2020-07-09 DIAGNOSIS — D696 Thrombocytopenia, unspecified: Secondary | ICD-10-CM

## 2020-07-09 DIAGNOSIS — Z1231 Encounter for screening mammogram for malignant neoplasm of breast: Secondary | ICD-10-CM | POA: Diagnosis not present

## 2020-07-09 DIAGNOSIS — Z0000 Encounter for general adult medical examination without abnormal findings: Secondary | ICD-10-CM | POA: Diagnosis not present

## 2020-07-09 DIAGNOSIS — Z23 Encounter for immunization: Secondary | ICD-10-CM | POA: Diagnosis not present

## 2020-07-09 DIAGNOSIS — B3789 Other sites of candidiasis: Secondary | ICD-10-CM

## 2020-07-09 DIAGNOSIS — E559 Vitamin D deficiency, unspecified: Secondary | ICD-10-CM

## 2020-07-09 MED ORDER — NYSTATIN 100000 UNIT/GM EX CREA: 1.0000 "application " | TOPICAL_CREAM | Freq: Two times a day (BID) | CUTANEOUS | 1 refills | Status: DC

## 2020-07-09 NOTE — Patient Instructions (Signed)
Preventive Care 84-46 Years Old, Female Preventive care refers to lifestyle choices and visits with your health care provider that can promote health and wellness. This includes:  A yearly physical exam. This is also called an annual wellness visit.  Regular dental and eye exams.  Immunizations.  Screening for certain conditions.  Healthy lifestyle choices, such as: ? Eating a healthy diet. ? Getting regular exercise. ? Not using drugs or products that contain nicotine and tobacco. ? Limiting alcohol use. What can I expect for my preventive care visit? Physical exam Your health care provider will check your:  Height and weight. These may be used to calculate your BMI (body mass index). BMI is a measurement that tells if you are at a healthy weight.  Heart rate and blood pressure.  Body temperature.  Skin for abnormal spots. Counseling Your health care provider may ask you questions about your:  Past medical problems.  Family's medical history.  Alcohol, tobacco, and drug use.  Emotional well-being.  Home life and relationship well-being.  Sexual activity.  Diet, exercise, and sleep habits.  Work and work Statistician.  Access to firearms.  Method of birth control.  Menstrual cycle.  Pregnancy history. What immunizations do I need? Vaccines are usually given at various ages, according to a schedule. Your health care provider will recommend vaccines for you based on your age, medical history, and lifestyle or other factors, such as travel or where you work.   What tests do I need? Blood tests  Lipid and cholesterol levels. These may be checked every 5 years, or more often if you are over 3 years old.  Hepatitis C test.  Hepatitis B test. Screening  Lung cancer screening. You may have this screening every year starting at age 73 if you have a 30-pack-year history of smoking and currently smoke or have quit within the past 15 years.  Colorectal cancer  screening. ? All adults should have this screening starting at age 52 and continuing until age 17. ? Your health care provider may recommend screening at age 49 if you are at increased risk. ? You will have tests every 1-10 years, depending on your results and the type of screening test.  Diabetes screening. ? This is done by checking your blood sugar (glucose) after you have not eaten for a while (fasting). ? You may have this done every 1-3 years.  Mammogram. ? This may be done every 1-2 years. ? Talk with your health care provider about when you should start having regular mammograms. This may depend on whether you have a family history of breast cancer.  BRCA-related cancer screening. This may be done if you have a family history of breast, ovarian, tubal, or peritoneal cancers.  Pelvic exam and Pap test. ? This may be done every 3 years starting at age 10. ? Starting at age 11, this may be done every 5 years if you have a Pap test in combination with an HPV test. Other tests  STD (sexually transmitted disease) testing, if you are at risk.  Bone density scan. This is done to screen for osteoporosis. You may have this scan if you are at high risk for osteoporosis. Talk with your health care provider about your test results, treatment options, and if necessary, the need for more tests. Follow these instructions at home: Eating and drinking  Eat a diet that includes fresh fruits and vegetables, whole grains, lean protein, and low-fat dairy products.  Take vitamin and mineral supplements  as recommended by your health care provider.  Do not drink alcohol if: ? Your health care provider tells you not to drink. ? You are pregnant, may be pregnant, or are planning to become pregnant.  If you drink alcohol: ? Limit how much you have to 0-1 drink a day. ? Be aware of how much alcohol is in your drink. In the [place] one drink equals one 12 oz bottle of beer (355 mL), one 5 oz glass of  wine (148 mL), or one 1 oz glass of hard liquor (44 mL).   Lifestyle  Take daily care of your teeth and gums. Brush your teeth every morning and night with fluoride toothpaste. Floss one time each day.  Stay active. Exercise for at least 30 minutes 5 or more days each week.  Do not use any products that contain nicotine or tobacco, such as cigarettes, e-cigarettes, and chewing tobacco. If you need help quitting, ask your health care provider.  Do not use drugs.  If you are sexually active, practice safe sex. Use a condom or other form of protection to prevent STIs (sexually transmitted infections).  If you do not wish to become pregnant, use a form of birth control. If you plan to become pregnant, see your health care provider for a prepregnancy visit.  If told by your health care provider, take low-dose aspirin daily starting at age 50.  Find healthy ways to cope with stress, such as: ? Meditation, yoga, or listening to music. ? Journaling. ? Talking to a trusted person. ? Spending time with friends and family. Safety  Always wear your seat belt while driving or riding in a vehicle.  Do not drive: ? If you have been drinking alcohol. Do not ride with someone who has been drinking. ? When you are tired or distracted. ? While texting.  Wear a helmet and other protective equipment during sports activities.  If you have firearms in your house, make sure you follow all gun safety procedures. What's next?  Visit your health care provider once a year for an annual wellness visit.  Ask your health care provider how often you should have your eyes and teeth checked.  Stay up to date on all vaccines. This information is not intended to replace advice given to you by your health care provider. Make sure you discuss any questions you have with your health care provider. Document Revised: 11/25/2019 Document Reviewed: 11/01/2017 Elsevier Patient Education  2021 Elsevier Inc.  

## 2020-07-09 NOTE — Progress Notes (Signed)
Subjective:     Angel Rodriguez is a 45 y.o. female and is here for a comprehensive physical exam. The patient reports problems - snoring, weight gain.  Social History   Socioeconomic History  . Marital status: Single    Spouse name: Not on file  . Number of children: Not on file  . Years of education: Not on file  . Highest education level: Not on file  Occupational History  . Not on file  Tobacco Use  . Smoking status: Former Smoker    Years: 20.00    Types: Cigarettes, E-cigarettes    Quit date: 05/05/2018    Years since quitting: 2.1  . Smokeless tobacco: Never Used  Vaping Use  . Vaping Use: Former  Substance and Sexual Activity  . Alcohol use: Not Currently  . Drug use: No  . Sexual activity: Yes    Birth control/protection: Surgical    Comment: hysterectomy  Other Topics Concern  . Not on file  Social History Narrative  . Not on file   Social Determinants of Health   Financial Resource Strain: Not on file  Food Insecurity: Not on file  Transportation Needs: Not on file  Physical Activity: Not on file  Stress: Not on file  Social Connections: Not on file  Intimate Partner Violence: Not on file   Health Maintenance  Topic Date Due  . COVID-19 Vaccine (1) Never done  . TETANUS/TDAP  Never done  . COLONOSCOPY (Pts 45-45yrs Insurance coverage will need to be confirmed)  Never done  . PAP SMEAR-Modifier  03/29/2020  . INFLUENZA VACCINE  10/04/2020  . Hepatitis C Screening  Completed  . HIV Screening  Completed  . HPV VACCINES  Aged Out    The following portions of the patient's history were reviewed and updated as appropriate: allergies, current medications, past family history, past medical history, past social history, past surgical history and problem list.  Review of Systems Pertinent items noted in HPI and remainder of comprehensive ROS otherwise negative.   Objective:    BP 122/82   Pulse 76   Temp (!) 97.4 F (36.3 C)   Ht 5' (1.524 m)   Wt  203 lb 1.6 oz (92.1 kg)   LMP 05/02/2017   SpO2 97%   BMI 39.67 kg/m  General appearance: alert, cooperative and no distress Head: Normocephalic, without obvious abnormality, atraumatic Eyes: negative findings: lids and lashes normal and EOMi, positive findings: conjunctival erythema of right eye Ears: normal TM's and external ear canals both ears Nose: Nares normal. Septum midline. Mucosa normal. No drainage or sinus tenderness. Throat: lips, mucosa, and tongue normal; teeth and gums normal Neck: no adenopathy, no JVD, supple, symmetrical, trachea midline and thyroid not enlarged, symmetric, no tenderness/mass/nodules Back: symmetric, no curvature. ROM normal. No CVA tenderness. Lungs: clear to auscultation bilaterally Heart: regular rate and rhythm, S1, S2 normal, no murmur, click, rub or gallop Abdomen: soft, non-tender; bowel sounds normal; no masses,  no organomegaly Extremities: extremities normal, atraumatic, no cyanosis or edema Pulses: 2+ and symmetric Skin: normal, mobility and turgor normal and no edema or macular - groin Lymph nodes: Cervical adenopathy: normal and Supraclavicular adenopathy: normal Neurologic: Grossly normal    Assessment:    Healthy female exam.     Plan:  -Continue to follow up with various specialists. -Will provide refill of nystatin cream. Continue to follow up with OB/GYN. -Will place orders for mammogram, colonoscopy, Tdap.  -Will collect fasting labs today.  -Recommend to schedule  office visit to discuss sleep apnea concerns.   See After Visit Summary for Counseling Recommendations

## 2020-07-10 LAB — COMPREHENSIVE METABOLIC PANEL
ALT: 87 IU/L — ABNORMAL HIGH (ref 0–32)
AST: 43 IU/L — ABNORMAL HIGH (ref 0–40)
Albumin/Globulin Ratio: 1.9 (ref 1.2–2.2)
Albumin: 4.6 g/dL (ref 3.8–4.8)
Alkaline Phosphatase: 66 IU/L (ref 44–121)
BUN/Creatinine Ratio: 11 (ref 9–23)
BUN: 7 mg/dL (ref 6–24)
Bilirubin Total: 0.2 mg/dL (ref 0.0–1.2)
CO2: 18 mmol/L — ABNORMAL LOW (ref 20–29)
Calcium: 9.6 mg/dL (ref 8.7–10.2)
Chloride: 103 mmol/L (ref 96–106)
Creatinine, Ser: 0.64 mg/dL (ref 0.57–1.00)
Globulin, Total: 2.4 g/dL (ref 1.5–4.5)
Glucose: 196 mg/dL — ABNORMAL HIGH (ref 65–99)
Potassium: 3.6 mmol/L (ref 3.5–5.2)
Sodium: 140 mmol/L (ref 134–144)
Total Protein: 7 g/dL (ref 6.0–8.5)
eGFR: 110 mL/min/{1.73_m2} (ref >59)

## 2020-07-10 LAB — CBC
Hematocrit: 40 % (ref 34.0–46.6)
Hemoglobin: 13.6 g/dL (ref 11.1–15.9)
MCH: 30.2 pg (ref 26.6–33.0)
MCHC: 34 g/dL (ref 31.5–35.7)
MCV: 89 fL (ref 79–97)
Platelets: 66 10*3/uL — CL (ref 150–450)
RBC: 4.5 x10E6/uL (ref 3.77–5.28)
RDW: 12.8 % (ref 11.7–15.4)
WBC: 7.4 10*3/uL (ref 3.4–10.8)

## 2020-07-10 LAB — VITAMIN D 25 HYDROXY (VIT D DEFICIENCY, FRACTURES): Vit D, 25-Hydroxy: 12.6 ng/mL — ABNORMAL LOW (ref 30.0–100.0)

## 2020-07-10 LAB — LIPID PANEL
Chol/HDL Ratio: 7.7 ratio — ABNORMAL HIGH (ref 0.0–4.4)
Cholesterol, Total: 368 mg/dL — ABNORMAL HIGH (ref 100–199)
HDL: 48 mg/dL (ref >39)
LDL Chol Calc (NIH): 193 mg/dL — ABNORMAL HIGH (ref 0–99)
Triglycerides: 596 mg/dL (ref 0–149)
VLDL Cholesterol Cal: 127 mg/dL — ABNORMAL HIGH (ref 5–40)

## 2020-07-10 LAB — HEMOGLOBIN A1C
Est. average glucose Bld gHb Est-mCnc: 217 mg/dL
Hgb A1c MFr Bld: 9.2 % — ABNORMAL HIGH (ref 4.8–5.6)

## 2020-07-10 LAB — TSH: TSH: 1.73 u[IU]/mL (ref 0.450–4.500)

## 2020-07-14 ENCOUNTER — Telehealth: Payer: Self-pay | Admitting: Physician Assistant

## 2020-07-14 NOTE — Telephone Encounter (Signed)
Patient is returning your call.    Thanks

## 2020-07-19 ENCOUNTER — Encounter: Payer: Self-pay | Admitting: Physician Assistant

## 2020-07-19 ENCOUNTER — Ambulatory Visit (INDEPENDENT_AMBULATORY_CARE_PROVIDER_SITE_OTHER): Payer: PRIVATE HEALTH INSURANCE | Admitting: Physician Assistant

## 2020-07-19 DIAGNOSIS — R748 Abnormal levels of other serum enzymes: Secondary | ICD-10-CM

## 2020-07-19 DIAGNOSIS — D696 Thrombocytopenia, unspecified: Secondary | ICD-10-CM

## 2020-07-19 DIAGNOSIS — E119 Type 2 diabetes mellitus without complications: Secondary | ICD-10-CM | POA: Diagnosis not present

## 2020-07-19 DIAGNOSIS — E785 Hyperlipidemia, unspecified: Secondary | ICD-10-CM | POA: Diagnosis not present

## 2020-07-19 DIAGNOSIS — E559 Vitamin D deficiency, unspecified: Secondary | ICD-10-CM

## 2020-07-19 DIAGNOSIS — E781 Pure hyperglyceridemia: Secondary | ICD-10-CM

## 2020-07-19 MED ORDER — METFORMIN HCL 500 MG PO TABS: ORAL_TABLET | ORAL | 2 refills | Status: DC

## 2020-07-19 NOTE — Progress Notes (Signed)
Telehealth office visit note for Angel Reid, PA-C- at Primary Care at [facility]   I connected with current patient today by telephone and verified that I am speaking with the correct person   . Location of the patient: Home . Location of the provider: Office - This visit type was conducted due to national recommendations for restrictions regarding the COVID-19 Pandemic (e.g. social distancing) in an effort to limit this patient's exposure and mitigate transmission in our community.    - No physical exam could be performed with this format, beyond that communicated to [place] by the patient/ family members as noted.   - Additionally my office staff/ schedulers were to discuss with the patient that there may be a monetary charge related to this service, depending on their medical insurance.  My understanding is that patient understood and consented to proceed.     _________________________________________________________________________________   History of Present Illness: Patient calls in to discuss most recent labs.  Recent fasting glucose 196 and A1c 9.2, last A1c 5.4 (03/14/2018).  Denies increased thirst or urination.  Patient denies prior history of diabetes or prediabetes.  No family history of diabetes.  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  States has noticed weight gain since being on corticosteroid therapy and is working on weight loss.  Recent CMP revealed elevated liver enzymes.  Patient denies excessive alcohol use.  States has 1-2 beers once a week.  Takes acetaminophen-codeine as needed for migraines.  Denies prior history of fatty liver or other liver disease.      No flowsheet data found.  Depression screen [facility] 2/9 07/19/2020 07/09/2020 05/09/2018  Decreased Interest _0 Down, Depressed, Hopeless _1 PHQ - 2 Score _2 Altered sleeping _3 Tired, decreased energy _4 Change in appetite _5 Feeling bad or failure about yourself  _6 Trouble concentrating _7 Moving slowly or fidgety/restless _8 Suicidal thoughts _9 PHQ-9 Score _10 Difficult doing work/chores - Very difficult Very difficult      Impression and Recommendations:     1. Diabetes mellitus, new onset ([place])   2. Elevated liver enzymes   3. Hyperlipidemia, unspecified hyperlipidemia type   4. Thrombocytopenia ([place])   5. Hypertriglyceridemia   6. Vitamin D deficiency     Diabetes mellitus, new onset: -Patient meets diagnosis criteria for T2DM, A1c 9.2 and fasting glucose >126 mg/dl. -Discussed with patient corticosteroid therapy likely contributing to hyperglycemia, however difficult to assess if steroid is the main cause of new onset diabetes given last A1c to compare is from 2020 and from lab review elevated glucose is noted since 03/2018. Discussed management options and is agreeable to start Metformin. Discussed potential side effects and advised to let me know if unable to tolerate. -Will send rx for glucometer kit and recommend to check fasting glucose daily. Goal is 90-120. -Recommend to continue weight loss efforts, reduce simple carbohydrates and glucose.  -Follow up in 3 months.  Elevated liver enzymes: -Recent AST 43, ALT 87 -Recommend to avoid alcohol and limit use of Tylenol. -Will repeat hepatic function in 6 weeks. If liver enzymes remain elevated, recommend further work-up including liver US.  Hyperlipidemia, unspecified hyperlipidemia type, Hypertriglyceridemia: -Recent lipid panel: total cholesterol 368, triglycerides 596, HDL 48, LDL 193 -  Discussed with patient the importance of reducing cardiovascular disease with co-existing DM and recommend to follow a low fat diet, reduce simple carbohydrates and increase exercise. Moderate-high statin therapy not appropriate given recent elevation of LFTs. Recommend to start fish oil. -Encourage to continue weight loss efforts.   -Will continue to monitor and repeat lipid panel in 3 months. If continues to be elevated, recommend referral to lipid clinic for alternative treatment options.   Thrombocytopenia: -Recent CBC: platelets 66, stable. -Patient has been evaluated by hematology/oncology.  -Patient has hx of ovarian cancer and followed by OB/GYN oncology.  Vitamin D deficiency: -Recent vitamin D 12.6, plan to discuss treatment therapy at upcoming visit.   - As part of my medical decision making, I reviewed the following data within the [place] History obtained from pt /family, CMA notes reviewed and incorporated if applicable, Labs reviewed, Radiograph/ tests reviewed if applicable and OV notes from prior OV's with me, as well as any other specialists she/he has seen since seeing me last, were all reviewed and used in my medical decision making process today.    - Additionally, when appropriate, discussion had with patient regarding our treatment plan, and their biases/concerns about that plan were used in my medical decision making today.    - The patient agreed with the plan and demonstrated an understanding of the instructions.   No barriers to understanding were identified.     - The patient was advised to call back or seek an in-person evaluation if the symptoms worsen or if the condition fails to improve as anticipated.   Return in about 3 months (around 10/19/2020) for DM- started med, HLD with [facility] (repeat lipid panel, a1c, cmp); lab visit in 6 weeks to repeat CMP.    No orders of the defined types were placed in this encounter.   Meds ordered this encounter  Medications  . metFORMIN (GLUCOPHAGE) 500 MG tablet    Sig: Take 1 tablet by mouth two times daily with a meal x 1 week. Then take 2 tablets by mouth two times daily with a meal.    Dispense:  120 tablet    Refill:  2    Order Specific Question:   Supervising Provider    Answer:   Beatrice Lecher D [[id number]]    There  are no discontinued medications.     Time spent on telephone encounter was 17 minutes.   Note:  This note was prepared with assistance of Dragon voice recognition software. Occasional wrong-word or sound-a-like substitutions may have occurred due to the inherent limitations of voice recognition software.    The [place] was signed into law in 2016 which includes the topic of electronic health records.  This provides immediate access to information in MyChart.  This includes consultation notes, operative notes, office notes, lab results and pathology reports.  If you have any questions about what you read please let us know at your next visit or call us at the office.  We are right here with you.   __________________________________________________________________________________     Patient Care Team    Relationship Specialty Notifications Start End  Angel Rodriguez, [place] PCP - General Physician Assistant  07/09/20   Earlie Raveling, NP Nurse Practitioner Nurse Practitioner  05/09/18      -Vitals obtained; medications/ allergies reconciled;  personal medical, social, Sx etc.histories were updated by CMA, reviewed by me and are reflected in chart   Patient Active Problem List  Diagnosis Date Noted  . Ingrowing toenail of left foot 01/20/2020  . 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 Pain in both hands 12/08/2019  . COVID-19 virus infection 03/06/2019  . GAD (generalized anxiety disorder) 09/03/2018  . 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 Panic disorder 06/06/2018  . Flank pain 05/27/2018  . Acute left-sided low back pain without sciatica 05/27/2018  . Mood disorder ([place]) 05/09/2018  . Mental confusion/ slurred speech 05/09/2018  . High risk medications (not anticoagulants) long-term use 05/09/2018  . Bilateral hand numbness 03/18/2018  . Vitamin D deficiency 03/18/2018  . Thrombocytopenia ([place]) 03/14/2018  . Dysarthria  03/14/2018  . Brain lesion 03/14/2018  . Right sided weakness 03/13/2018  . 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 Abnormal MRI of head 01/21/2016  . Vascular headache 09/15/2015  . Insomnia 09/15/2015  . Headache 07/08/2015  . Intractable chronic migraine without aura and with status migrainosus 07/08/2015  . Neck pain 06/24/2015  . Seizure ([place]) 06/24/2015  . Central retinal vein occlusion 12/11/2011  . Cotton wool exudates 12/11/2011  . Cystoid macular edema 12/11/2011     Current Meds  Medication Sig  . Acetaminophen-Codeine 300-30 MG tablet ONE TO TWO AS NEEDED FOR HEADACHE. NO MORE THAN 4/DAY  . Ascorbic Acid (VITAMIN C) 1000 MG tablet Take 1,000 mg by mouth daily.  [name] atropine 1 % ophthalmic solution Place 1 drop into the right eye 3 (three) times daily.   . brimonidine (ALPHAGAN P) 0.1 % SOLN Place 1 drop into the right eye 3 (three) times daily.   . calcium carbonate (TUMS EX) 750 MG chewable tablet Chew 2 tablets by mouth daily as needed for heartburn.   . clonazePAM (KLONOPIN) 1 MG tablet Take 1 tablet (1 mg total) by mouth 2 (two) times daily as needed for anxiety (sleep).  . dorzolamide-timolol (COSOPT) 22.3-6.8 MG/ML ophthalmic solution Place 1 drop into the right eye 2 (two) times daily.   . fluticasone (FLONASE) 50 MCG/ACT nasal spray Place 1-2 sprays into both nostrils daily as needed for allergies.   . Lacosamide 100 MG TABS Take 1 tablet (100 mg total) by mouth 2 (two) times daily.  [name] latanoprost (XALATAN) 0.005 % ophthalmic solution Place 1 drop into both eyes at bedtime.  . metFORMIN (GLUCOPHAGE) 500 MG tablet Take 1 tablet by mouth two times daily with a meal x 1 week. Then take 2 tablets by mouth two times daily with a meal.  . neomycin-polymyxin-hydrocortisone (CORTISPORIN) OTIC solution Apply 1-2 drops to toe after soaking BID  . nystatin cream (MYCOSTATIN) Apply 1 application topically 2 (two) times daily. Apply to affected area BID for up to 7  days.  . predniSONE (DELTASONE) 10 MG tablet Take 1 tablet (10 mg total) by mouth daily with breakfast.  . SUMAtriptan (IMITREX) 100 MG tablet Take 1 tablet (100 mg total) by mouth once as needed for migraine. May repeat in 2 hours if headache persists or recurs.  . verapamil (CALAN-SR) 240 MG CR tablet TAKE 1 TABLET BY MOUTH AT BEDTIME.  [name] vortioxetine HBr (TRINTELLIX) 10 MG TABS tablet Take 3 tablets (30 mg total) by mouth daily.     Allergies:  Allergies  Allergen Reactions  . Ivp Dye [Iodinated Diagnostic Agents] Hives, Itching and Swelling  . Bee Venom Swelling     ROS:  See above HPI for pertinent positives and negatives   Objective:   Last menstrual period 05/02/2017.  (if some vitals are omitted,  this means that patient was UNABLE to obtain them. ) General: A & O * 3; sounds in no acute distress Respiratory: speaking in full sentences, no conversational dyspnea Psych: insight appears good, mood- appears full

## 2020-07-19 NOTE — Patient Instructions (Signed)
Diabetes Mellitus and Nutrition, Adult When you have diabetes, or diabetes mellitus, it is very important to have healthy eating habits because your blood sugar (glucose) levels are greatly affected by what you eat and drink. Eating healthy foods in the right amounts, at about the same times every day, can help you:  Control your blood glucose.  Lower your risk of heart disease.  Improve your blood pressure.  Reach or maintain a healthy weight. What can affect my meal plan? Every person with diabetes is different, and each person has different needs for a meal plan. Your health care provider may recommend that you work with a dietitian to make a meal plan that is best for you. Your meal plan may vary depending on factors such as:  The calories you need.  The medicines you take.  Your weight.  Your blood glucose, blood pressure, and cholesterol levels.  Your activity level.  Other health conditions you have, such as heart or kidney disease. How do carbohydrates affect me? Carbohydrates, also called carbs, affect your blood glucose level more than any other type of food. Eating carbs naturally raises the amount of glucose in your blood. Carb counting is a method for keeping track of how many carbs you eat. Counting carbs is important to keep your blood glucose at a healthy level, especially if you use insulin or take certain oral diabetes medicines. It is important to know how many carbs you can safely have in each meal. This is different for every person. Your dietitian can help you calculate how many carbs you should have at each meal and for each snack. How does alcohol affect me? Alcohol can cause a sudden decrease in blood glucose (hypoglycemia), especially if you use insulin or take certain oral diabetes medicines. Hypoglycemia can be a life-threatening condition. Symptoms of hypoglycemia, such as sleepiness, dizziness, and confusion, are similar to symptoms of having too much  alcohol.  Do not drink alcohol if: ? Your health care provider tells you not to drink. ? You are pregnant, may be pregnant, or are planning to become pregnant.  If you drink alcohol: ? Do not drink on an empty stomach. ? Limit how much you use to:  0-1 drink a day for women.  0-2 drinks a day for men. ? Be aware of how much alcohol is in your drink. In the [place] one drink equals one 12 oz bottle of beer (355 mL), one 5 oz glass of wine (148 mL), or one 1 oz glass of hard liquor (44 mL). ? Keep yourself hydrated with water, diet soda, or unsweetened iced tea.  Keep in mind that regular soda, juice, and other mixers may contain a lot of sugar and must be counted as carbs. What are tips for following this plan? Reading food labels  Start by checking the serving size on the "Nutrition Facts" label of packaged foods and drinks. The amount of calories, carbs, fats, and other nutrients listed on the label is based on one serving of the item. Many items contain more than one serving per package.  Check the total grams (g) of carbs in one serving. You can calculate the number of servings of carbs in one serving by dividing the total carbs by 15. For example, if a food has 30 g of total carbs per serving, it would be equal to 2 servings of carbs.  Check the number of grams (g) of saturated fats and trans fats in one serving. Choose foods that have   a low amount or none of these fats.  Check the number of milligrams (mg) of salt (sodium) in one serving. Most people should limit total sodium intake to less than 2,300 mg per day.  Always check the nutrition information of foods labeled as "low-fat" or "nonfat." These foods may be higher in added sugar or refined carbs and should be avoided.  Talk to your dietitian to identify your daily goals for nutrients listed on the label. Shopping  Avoid buying canned, pre-made, or processed foods. These foods tend to be high in fat, sodium, and added  sugar.  Shop around the outside edge of the grocery store. This is where you will most often find fresh fruits and vegetables, bulk grains, fresh meats, and fresh dairy. Cooking  Use low-heat cooking methods, such as baking, instead of high-heat cooking methods like deep frying.  Cook using healthy oils, such as olive, canola, or sunflower oil.  Avoid cooking with butter, cream, or high-fat meats. Meal planning  Eat meals and snacks regularly, preferably at the same times every day. Avoid going long periods of time without eating.  Eat foods that are high in fiber, such as fresh fruits, vegetables, beans, and whole grains. Talk with your dietitian about how many servings of carbs you can eat at each meal.  Eat 4-6 oz (112-168 g) of lean protein each day, such as lean meat, chicken, fish, eggs, or tofu. One ounce (oz) of lean protein is equal to: ? 1 oz (28 g) of meat, chicken, or fish. ? 1 egg. ?  cup (62 g) of tofu.  Eat some foods each day that contain healthy fats, such as avocado, nuts, seeds, and fish.   What foods should I eat? Fruits Berries. Apples. Oranges. Peaches. Apricots. Plums. Grapes. Mango. Papaya. Pomegranate. Kiwi. Cherries. Vegetables Lettuce. Spinach. Leafy greens, including kale, chard, collard greens, and mustard greens. Beets. Cauliflower. Cabbage. Broccoli. Carrots. Green beans. Tomatoes. Peppers. Onions. Cucumbers. Brussels sprouts. Grains Whole grains, such as whole-wheat or whole-grain bread, crackers, tortillas, cereal, and pasta. Unsweetened oatmeal. Quinoa. Brown or wild rice. Meats and other proteins Seafood. Poultry without skin. Lean cuts of poultry and beef. Tofu. Nuts. Seeds. Dairy Low-fat or fat-free dairy products such as milk, yogurt, and cheese. The items listed above may not be a complete list of foods and beverages you can eat. Contact a dietitian for more information. What foods should I avoid? Fruits Fruits canned with  syrup. Vegetables Canned vegetables. Frozen vegetables with butter or cream sauce. Grains Refined white flour and flour products such as bread, pasta, snack foods, and cereals. Avoid all processed foods. Meats and other proteins Fatty cuts of meat. Poultry with skin. Breaded or fried meats. Processed meat. Avoid saturated fats. Dairy Full-fat yogurt, cheese, or milk. Beverages Sweetened drinks, such as soda or iced tea. The items listed above may not be a complete list of foods and beverages you should avoid. Contact a dietitian for more information. Questions to ask a health care provider  Do I need to meet with a diabetes educator?  Do I need to meet with a dietitian?  What number can I call if I have questions?  When are the best times to check my blood glucose? Where to find more information:  American Diabetes Association: diabetes.org  Academy of Nutrition and Dietetics: www.eatright.org  National Institute of Diabetes and Digestive and Kidney Diseases: www.niddk.nih.gov  Association of Diabetes Care and Education Specialists: www.diabeteseducator.org Summary  It is important to have healthy eating   habits because your blood sugar (glucose) levels are greatly affected by what you eat and drink.  A healthy meal plan will help you control your blood glucose and maintain a healthy lifestyle.  Your health care provider may recommend that you work with a dietitian to make a meal plan that is best for you.  Keep in mind that carbohydrates (carbs) and alcohol have immediate effects on your blood glucose levels. It is important to count carbs and to use alcohol carefully. This information is not intended to replace advice given to you by your health care provider. Make sure you discuss any questions you have with your health care provider. Document Revised: 01/28/2019 Document Reviewed: 01/28/2019 Elsevier Patient Education  2021 Elsevier Inc.  

## 2020-07-21 ENCOUNTER — Telehealth: Payer: Self-pay | Admitting: Physician Assistant

## 2020-07-21 ENCOUNTER — Other Ambulatory Visit: Payer: Self-pay | Admitting: Physician Assistant

## 2020-07-21 DIAGNOSIS — E119 Type 2 diabetes mellitus without complications: Secondary | ICD-10-CM

## 2020-07-21 MED ORDER — BLOOD GLUCOSE METER KIT: PACK | 0 refills | Status: DC

## 2020-07-21 MED ORDER — ACCU-CHEK SOFTCLIX LANCETS MISC: 12 refills | Status: AC

## 2020-07-21 MED ORDER — ACCU-CHEK AVIVA PLUS VI STRP: ORAL_STRIP | 12 refills | Status: DC

## 2020-07-21 NOTE — Addendum Note (Signed)
Addended by: Timaya Bojarski M on: 07/21/2020 02:46 PM   Modules accepted: Orders  

## 2020-07-21 NOTE — Addendum Note (Signed)
Addended by: Mickel Crow on: 07/21/2020 02:46 PM   Modules accepted: Orders

## 2020-07-21 NOTE — Telephone Encounter (Signed)
Patient needs a glucometer and strips, and lancets and is going to to call her insurance to see which is covered by her insurance and then call us back. She did not know a specific brand.

## 2020-07-21 NOTE — Telephone Encounter (Signed)
Supplies sent to requested pharmacy. AS, CMA

## 2020-07-21 NOTE — Telephone Encounter (Signed)
Patient needs a glucometer that is accucheck avia plus and that should include the lancets, strips, and thermometer patient states. [facility] on [address]. Thanks

## 2020-07-22 ENCOUNTER — Telehealth: Payer: Self-pay | Admitting: Physician Assistant

## 2020-07-22 NOTE — Telephone Encounter (Signed)
Patient's pharmacy states she needs to know how many lancets and how often she is testing for her glucometer. Please advise, thanks.

## 2020-07-22 NOTE — Telephone Encounter (Signed)
Contacted pharmacy and advised patient to use to check fasting blood sugar and 2 hrs after largest meal. Verbal auth given to [place]. AS, CMA

## 2020-07-28 ENCOUNTER — Encounter: Payer: Self-pay | Admitting: Physician Assistant

## 2020-07-28 ENCOUNTER — Ambulatory Visit (INDEPENDENT_AMBULATORY_CARE_PROVIDER_SITE_OTHER): Payer: PRIVATE HEALTH INSURANCE | Admitting: Physician Assistant

## 2020-07-28 ENCOUNTER — Telehealth: Payer: Self-pay | Admitting: Physician Assistant

## 2020-07-28 DIAGNOSIS — E119 Type 2 diabetes mellitus without complications: Secondary | ICD-10-CM

## 2020-07-28 DIAGNOSIS — R0683 Snoring: Secondary | ICD-10-CM | POA: Diagnosis not present

## 2020-07-28 DIAGNOSIS — E559 Vitamin D deficiency, unspecified: Secondary | ICD-10-CM

## 2020-07-28 DIAGNOSIS — R0681 Apnea, not elsewhere classified: Secondary | ICD-10-CM

## 2020-07-28 MED ORDER — ACCU-CHEK GUIDE VI STRP: ORAL_STRIP | 12 refills | Status: AC

## 2020-07-28 MED ORDER — VITAMIN D (ERGOCALCIFEROL) 1.25 MG (50000 UNIT) PO CAPS: 50000.0000 [IU] | ORAL_CAPSULE | ORAL | 0 refills | Status: DC

## 2020-07-28 NOTE — Telephone Encounter (Signed)
Megan from [facility] sleep clinic called stating unable to schedule apt with a direct order from [facility] but that patient is established with Dr. Felecia Shelling and can contact them to schedule. Patient has been made aware of this. AS, CMA

## 2020-07-28 NOTE — Progress Notes (Signed)
Telehealth office visit note for Angel Reid, PA-C- at Primary Care at [facility]   I connected with current patient today by telephone and verified that I am speaking with the correct person   . Location of the patient: Home . Location of the provider: Office - This visit type was conducted due to national recommendations for restrictions regarding the COVID-19 Pandemic (e.g. social distancing) in an effort to limit this patient's exposure and mitigate transmission in our community.    - No physical exam could be performed with this format, beyond that communicated to [place] by the patient/ family members as noted.   - Additionally my office staff/ schedulers were to discuss with the patient that there may be a monetary charge related to this service, depending on their medical insurance.  My understanding is that patient understood and consented to proceed.     _________________________________________________________________________________   History of Present Illness: Patient calls in to discuss concerns of sleep apnea. Patient states her daughter has noticed she snores "like a train" and she stopped breathing during her sleep. No prior hx of OSA. States constantly feels tired and sometimes can sleep and other times cannot. Patient has gained weight since starting corticosteroid therapy for vasculitis. Also states is having trouble with getting the right strips for her glucometer.      GAD 7 : Generalized Anxiety Score 07/28/2020  Nervous, Anxious, on Edge 2  Control/stop worrying 2  Worry too much - different things 2  Trouble relaxing 2  Restless 1  Easily annoyed or irritable 2  Afraid - awful might happen 2  Total GAD 7 Score 13  Anxiety Difficulty Somewhat difficult    Depression screen [facility] 2/9 07/28/2020 07/19/2020 07/09/2020 05/09/2018  Decreased Interest 3 3 3 3   Down, Depressed, Hopeless 3 3 1 3   PHQ - 2 Score 6 6 4 6   Altered sleeping 3 1 3 3   Tired, decreased  energy 3 3 3 3   Change in appetite 1 2 3 3   Feeling bad or failure about yourself  3 2 3 3   Trouble concentrating 1 2 3 3   Moving slowly or fidgety/restless 1 1 3 3   Suicidal thoughts 1 1 3 3   PHQ-9 Score 19 18 25 27   Difficult doing work/chores Not difficult at all - Very difficult Very difficult      Impression and Recommendations:     1. Snoring   2. Episode of apnea   3. Vitamin D deficiency   4. Diabetes mellitus, new onset ([place])     Snoring, episode of apnea: -Patient's Epworth Sleepiness Scale score is 12 and STOP BANG questionnaire indicates high risk of OSA (answered yes to questions 1-3, 5) so will place order for sleep study. Patient is an established patient with [facility].   Vitamin D deficiency: -Discussed Vitamin D results, 12.6 and patient is agreeable to start once a week Vit D 50,000 units. -Will repeat Vit D at follow up visit.   Staff will [facility] to inquire about test strips.     - As part of my medical decision making, I reviewed the following data within the [place] History obtained from pt /family, CMA notes reviewed and incorporated if applicable, Labs reviewed, Radiograph/ tests reviewed if applicable and OV notes from prior OV's with me, as well as any other specialists she/he has seen since seeing me last, were all reviewed and used in my medical decision making process  today.    - Additionally, when appropriate, discussion had with patient regarding our treatment plan, and their biases/concerns about that plan were used in my medical decision making today.    - The patient agreed with the plan and demonstrated an understanding of the instructions.   No barriers to understanding were identified.     - The patient was advised to call back or seek an in-person evaluation if the symptoms worsen or if the condition fails to improve as anticipated.   Return for as schduled .    Orders Placed This Encounter  Procedures  .  PSG Sleep Study    Meds ordered this encounter  Medications  . Vitamin D, Ergocalciferol, (DRISDOL) 1.25 MG (50000 UNIT) CAPS capsule    Sig: Take 1 capsule (50,000 Units total) by mouth every 7 (seven) days.    Dispense:  12 capsule    Refill:  0    Order Specific Question:   Supervising Provider    Answer:   Beatrice Lecher D [[id number]]  . glucose blood (ACCU-CHEK GUIDE) test strip    Sig: Use to check fasting glucose and 2 hours after largest meal    Dispense:  100 each    Refill:  12    Medications Discontinued During This Encounter  Medication Reason  . glucose blood (ACCU-CHEK AVIVA PLUS) test strip Not available       Time spent on telephone encounter was 11 minutes.   Note:  This note was prepared with assistance of Dragon voice recognition software. Occasional wrong-word or sound-a-like substitutions may have occurred due to the inherent limitations of voice recognition software.    The [place] was signed into law in 2016 which includes the topic of electronic health records.  This provides immediate access to information in MyChart.  This includes consultation notes, operative notes, office notes, lab results and pathology reports.  If you have any questions about what you read please let us know at your next visit or call us at the office.  We are right here with you.   __________________________________________________________________________________     Patient Care Team    Relationship Specialty Notifications Start End  Angel Rodriguez, [place] PCP - General Physician Assistant  07/09/20   Earlie Raveling, NP Nurse Practitioner Nurse Practitioner  05/09/18      -Vitals obtained; medications/ allergies reconciled;  personal medical, social, Sx etc.histories were updated by CMA, reviewed by me and are reflected in chart   Patient Active Problem List   Diagnosis Date Noted  . Ingrowing toenail of left foot 01/20/2020  . 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 Pain in both hands 12/08/2019  . COVID-19 virus infection 03/06/2019  . GAD (generalized anxiety disorder) 09/03/2018  . 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 Panic disorder 06/06/2018  . Flank pain 05/27/2018  . Acute left-sided low back pain without sciatica 05/27/2018  . Mood disorder ([place]) 05/09/2018  . Mental confusion/ slurred speech 05/09/2018  . High risk medications (not anticoagulants) long-term use 05/09/2018  . Bilateral hand numbness 03/18/2018  . Vitamin D deficiency 03/18/2018  . Thrombocytopenia ([place]) 03/14/2018  . Dysarthria 03/14/2018  . Brain lesion 03/14/2018  . Right sided weakness 03/13/2018  . 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 Abnormal MRI of head 01/21/2016  . Vascular headache 09/15/2015  . Insomnia 09/15/2015  . Headache 07/08/2015  . Intractable chronic migraine without aura and with status migrainosus 07/08/2015  .  Neck pain 06/24/2015  . Seizure ([place]) 06/24/2015  . Central retinal vein occlusion 12/11/2011  . Cotton wool exudates 12/11/2011  . Cystoid macular edema 12/11/2011     Current Meds  Medication Sig  . Accu-Chek Softclix Lancets lancets Use as instructed  . Acetaminophen-Codeine 300-30 MG tablet ONE TO TWO AS NEEDED FOR HEADACHE. NO MORE THAN 4/DAY  . Ascorbic Acid (VITAMIN C) 1000 MG tablet Take 1,000 mg by mouth daily.  [name] atropine 1 % ophthalmic solution Place 1 drop into the right eye 3 (three) times daily.   . blood glucose meter kit and supplies Dispense based on patient and insurance preference. Use to check fasting blood sugar and 2 hrs after largest meal. (FOR ICD-10 E10.9, E11.9).  . brimonidine (ALPHAGAN P) 0.1 % SOLN Place 1 drop into the right eye 3 (three) times daily.   . calcium carbonate (TUMS EX) 750 MG chewable tablet Chew 2 tablets by mouth daily as needed for heartburn.   . clonazePAM (KLONOPIN) 1 MG tablet Take 1 tablet (1 mg total) by  mouth 2 (two) times daily as needed for anxiety (sleep).  . dorzolamide-timolol (COSOPT) 22.3-6.8 MG/ML ophthalmic solution Place 1 drop into the right eye 2 (two) times daily.   . fluticasone (FLONASE) 50 MCG/ACT nasal spray Place 1-2 sprays into both nostrils daily as needed for allergies.   [name] glucose blood (ACCU-CHEK GUIDE) test strip Use to check fasting glucose and 2 hours after largest meal  . Lacosamide 100 MG TABS Take 1 tablet (100 mg total) by mouth 2 (two) times daily.  [name] latanoprost (XALATAN) 0.005 % ophthalmic solution Place 1 drop into both eyes at bedtime.  . metFORMIN (GLUCOPHAGE) 500 MG tablet Take 1 tablet by mouth two times daily with a meal x 1 week. Then take 2 tablets by mouth two times daily with a meal.  . neomycin-polymyxin-hydrocortisone (CORTISPORIN) OTIC solution Apply 1-2 drops to toe after soaking BID  . nystatin cream (MYCOSTATIN) Apply 1 application topically 2 (two) times daily. Apply to affected area BID for up to 7 days.  . predniSONE (DELTASONE) 10 MG tablet Take 1 tablet (10 mg total) by mouth daily with breakfast.  . SUMAtriptan (IMITREX) 100 MG tablet Take 1 tablet (100 mg total) by mouth once as needed for migraine. May repeat in 2 hours if headache persists or recurs.  . verapamil (CALAN-SR) 240 MG CR tablet TAKE 1 TABLET BY MOUTH AT BEDTIME.  [name] Vitamin D, Ergocalciferol, (DRISDOL) 1.25 MG (50000 UNIT) CAPS capsule Take 1 capsule (50,000 Units total) by mouth every 7 (seven) days.  [name] vortioxetine HBr (TRINTELLIX) 10 MG TABS tablet Take 3 tablets (30 mg total) by mouth daily.  . [DISCONTINUED] glucose blood (ACCU-CHEK AVIVA PLUS) test strip Use as instructed     Allergies:  Allergies  Allergen Reactions  . Ivp Dye [Iodinated Diagnostic Agents] Hives, Itching and Swelling  . Bee Venom Swelling     ROS:  See above HPI for pertinent positives and negatives   Objective:   Last menstrual period 05/02/2017.  (if some vitals are omitted, this means  that patient was UNABLE to obtain them. ) General: A & O * 3; sounds in no acute distress Respiratory: speaking in full sentences, no conversational dyspnea Psych: insight appears good, mood- appears full

## 2020-07-29 ENCOUNTER — Telehealth: Payer: Self-pay | Admitting: Physician Assistant

## 2020-07-29 NOTE — Telephone Encounter (Signed)
  Lorrene Reid, PA-C  Physician Assistant    Conversation (Newest Message First)  Me      07/28/20 10:42 AM Note Megan from [facility] called stating unable to schedule apt with a direct order from [facility] but that patient is established with Dr. Felecia Shelling and can contact them to schedule. Patient has been made aware of this. AS, CMA       Spoke with patient yesterday regarding the referral to [facility] and patient verbalized understanding then.   Spoke with patient today and advised of the above. Pt verbalized understanding. AS, CMA

## 2020-07-29 NOTE — Telephone Encounter (Signed)
Patient needs a referral for a sleep study to [facility], thanks.

## 2020-08-03 ENCOUNTER — Telehealth: Payer: Self-pay | Admitting: Neurology

## 2020-08-03 ENCOUNTER — Telehealth: Payer: Self-pay | Admitting: Physician Assistant

## 2020-08-03 DIAGNOSIS — R0683 Snoring: Secondary | ICD-10-CM

## 2020-08-03 DIAGNOSIS — R0681 Apnea, not elsewhere classified: Secondary | ICD-10-CM

## 2020-08-03 NOTE — Addendum Note (Signed)
Addended by: Mickel Crow on: 08/03/2020 01:21 PM   Modules accepted: Orders

## 2020-08-03 NOTE — Telephone Encounter (Signed)
For 2 weeks, she should reduce the dose to  1/2 pill every other day alternating with 1 pill every other day  After those 2 weeks she can cut down to half a pill every day.  She has an appointment to see me in July and we will cut the dose back further at that time depending on how she does with her headaches

## 2020-08-03 NOTE — Telephone Encounter (Signed)
Referral has been placed. AS, CMA 

## 2020-08-03 NOTE — Telephone Encounter (Signed)
Patient called in stating she needs a referral sent to Dr Renne Musca at [facility] by [place] then he can send the referral to the sleep study center. Please advise, thanks.

## 2020-08-03 NOTE — Telephone Encounter (Signed)
Pt called, been on the predniSONE (DELTASONE) 10 MG tablet for 3 months and causing some problems; now I'm a diabetic. Want to come off the medication and try something else. Would like a call from the nurse.

## 2020-08-04 NOTE — Telephone Encounter (Signed)
Called patient and went over Dr. Garth Bigness recommendation on weaning prednisone.  Discussed in detail that patient alternates 1/2 pill one day and then 1 pill the next for two weeks.  She took 1 pill today so on June 15th, she can start taking 1/2 pill every day until seen by Dr. Felecia Shelling in July and he will discuss treatment options and further weaning down off of prednisone dependent on how her headache is.  Patient denied further questions, verbalized understanding and expressed appreciation for the phone call.

## 2020-08-11 ENCOUNTER — Telehealth: Payer: Self-pay | Admitting: Physician Assistant

## 2020-08-11 NOTE — Telephone Encounter (Signed)
Patient needs clarification her diabetes medication on how to take it and when. Please advise, thanks.

## 2020-08-11 NOTE — Telephone Encounter (Signed)
Patient was frustrated with lack of instruction on how to use blood glucose monitor and medication instruction. I spoke with patient about medication instructions and how to check blood glucose. Offered patient a nurse visit to discuss them in person. Patient declined. She requested more information about diabetes and diet. I printed off last AVS and more information on diet and diabetes. Information packets and AVS are ready for pick up front desk.

## 2020-08-12 ENCOUNTER — Telehealth: Payer: Self-pay | Admitting: Neurology

## 2020-08-12 NOTE — Telephone Encounter (Signed)
Patient called in requesting a call back from Dr. Garth Bigness nurse as she has many new concerns. She is starting to have headaches with eye pain. She also states her kids have told her she stops breathing in her sleep. She can be reached at [phone].

## 2020-08-12 NOTE — Telephone Encounter (Signed)
Called pt, scheduled work in visit for 08/16/20 at 4pm w/ Dr. Felecia Shelling. Asked her to check in by 330pm. She verbalized understanding.

## 2020-08-16 ENCOUNTER — Encounter: Payer: Self-pay | Admitting: Neurology

## 2020-08-16 ENCOUNTER — Ambulatory Visit: Payer: 59 | Admitting: Neurology

## 2020-08-16 VITALS — BP 146/87 | HR 68 | Ht 60.0 in | Wt 197.5 lb

## 2020-08-16 DIAGNOSIS — G4733 Obstructive sleep apnea (adult) (pediatric): Secondary | ICD-10-CM | POA: Insufficient documentation

## 2020-08-16 DIAGNOSIS — F39 Unspecified mood [affective] disorder: Secondary | ICD-10-CM

## 2020-08-16 DIAGNOSIS — R569 Unspecified convulsions: Secondary | ICD-10-CM | POA: Diagnosis not present

## 2020-08-16 DIAGNOSIS — I776 Arteritis, unspecified: Secondary | ICD-10-CM | POA: Diagnosis not present

## 2020-08-16 DIAGNOSIS — E119 Type 2 diabetes mellitus without complications: Secondary | ICD-10-CM

## 2020-08-16 DIAGNOSIS — G441 Vascular headache, not elsewhere classified: Secondary | ICD-10-CM

## 2020-08-16 DIAGNOSIS — M542 Cervicalgia: Secondary | ICD-10-CM | POA: Diagnosis not present

## 2020-08-16 DIAGNOSIS — Z79899 Other long term (current) drug therapy: Secondary | ICD-10-CM

## 2020-08-16 NOTE — Progress Notes (Signed)
[facility]  PATIENT: Angel Rodriguez DOB: 1975/09/14  REFERRING DOCTOR OR PCP:   SOURCE: patient, ED records, images on PACS, reports in EMR  _________________________________   HISTORICAL  CHIEF COMPLAINT:  Chief Complaint  Patient presents with   Follow-up    RM 13, alone. Last seen 12/08/2019. Seizures-takes vimpat. Migraines-takes tylenol/imitrex prn. Mood- takes trintellix, clonazepam. On prednisone 5 ( cut it in half) mg po qd. Here for ongoing headaches/eye pain.    HISTORY OF PRESENT ILLNESS:  Angel Rodriguez is a 45 y.o. woman with frequent headaches and h/o seizure.  Update 08/16/2020: She was doing better for a while after starting the prednisone.    Headache and neuro symptoms were better untilmore recently.   Her headaches are worse.  They are right > left sided and sometimes she has visua chanes.  .    Right eye are not red now but have been off/on the past month  She has elevated glucose and the steroid is reduced from 10 mg to 5 mg daily.   She just started metformin.  She feels her headaches were doing better last year and have acted back up again over the last few months.     She denies any visual changes.   She has numbness in hr hands but no weakness.  The numbness is worse on the right.   It is in the palms more than dorsum.  Shaking her arms help.       She has no recent seizure.   Her last one was April 2021.  She is on Vimpat.    She is on Trintellix and clonazepam for anxiety and depression.   Mood was doing better last year than this year.  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  She had a second opinion from [place].  Changes in her seizure medications were made but no opinion about the possibility of vasculitis.  She was placed on prednisone 60 mg and titrating down in July 2021.  She had improvement of headaches and other symptoms.  MRI 10/15/2019 showed:   "Resolution of a region of T2 and FLAIR signal within the posterior frontal cortical and subcortical brain on the study of May. One could question slight increased prominence of a T2 and FLAIR focus without enhancement in the subcortical brain at the bottom of a left frontal sulcus, axial FLAIR image 19. No other new or potentially progressive finding. Minimal small foci of cortical and subcortical enhancement in the left frontal region. I think the differential diagnosis for this case remains that of a vasculitis syndrome, venous thrombotic syndrome, autoimmune syndrome or demyelinating syndrome. I do not think we are dealing with tumor or arterial infarctions. "  MRI brain 07/06/2018 personally reviewed and compared to studies from January  2020.  It shows 2 foci in the anterior left frontal lobe.  One was mildly increased in size compared to the January MRI and one was decreased in size.  Another focus seen on the previous MRI in the left parasagittal frontal lobe has resolved.  MRI of the cervical and thoracic spine 03/25/2018 showed normal spinal cord and mild cervical degenerative changes.  Labs: CSF 03/25/2018 showed 10 white blood cells (lymphocytes), 3 red cells, normal glucose, protein oligoclonal bands, VDRL.  In 03/2018, negative ANA, cANCA and pANCA, weakly positive ANCA proteinase 3, normal ACE level. ESR 10.  HIV was negative 09/22/2017.  Hepatitis C was  negative 04/2016  Update 06/27/2018 (virtual) She continues to experience headaches.  These are intermittent and are always right-sided worse than left side.  Most headaches are associated with nausea, photophobia and phonophobia.  Some are associated with erythema of the right eye.  Imitrex does not always help.  Tylenol 3 often helps..  She is not noting vision changes with the headaches..  She is having these migrainous headaches two days a week.   She does not have one right now.    In January she had neurologic symptoms including numbness and weakness in her right hand.  Details are in my visit notes from that day.  She feels she is better and she had complete resolution of the right-sided symptoms.  The MRI is unusual with 3 juxtacortical and subcortical enhancing lesions, 2 of which were present on MRI 02/14/2016 with 1 of them enhancing at that time.  An MRI 07/02/2015 showed the 2 previous lesions without enhancement.  She was supposed to get a second opinion in late March at [facility] but it was rescheduled due to Covid-19.  I encouraged her to follow through as I do not have a clear diagnosis for her symptoms and MRI changes.  We discussed that the numbness and clumsiness in the hands she experienced could be explained by the enhancing lesions but the etiology is unclear  She feels stressed and is anxious.   She sees psychiatry.   She is on Trintellix and prn hydroxyzine to help with anxiety.        Update 03/18/2018: She was admitted to the hospital last week after presenting with right hand numbness, weakness and clumsiness.  She called a work acquaintance during the visit to describe her symptoms.  Although she seemed dazed while the symptoms were occurring before she left to go to the hospital, she did not have any generalized tonic-clonic activity.  She was less responsive but was able to understand what was being said to her and was communicating.  Numbness spread up over ten minutes to the  entire leg and then was followed by the rest of the right side, including the face.   She had a mild headache but less severe than many of the headaches she has had in the past.      Notes from her hospital stay and multiple laboratory results and imaging studies were reviewed.  There was a concern that she might of had a seizure and she was started on Keppra.  She had imaging performed last week.  I personally reviewed the MRI images on PACS.    She has the 2 foci seen in 2017 in the left frontal subcortical white matter but the one focus has some enhancement and is larger.  The other is unchanged.   There is a newer midline subcortical focus, also on the left that enhances.  No spinal plaques.  She also had an LP.  Cells were mildly elevated (10 WBC  89% lymphs; 11% mono) and protein was normal.   Glucose was slightly elevated at 84.  OCB is pending.  VDRL is pending.    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   In 2016, she had a seizure.  MRI showed 2 moderate size nonenhancing subcortical foci in the left frontal lobe.   A repeat MRI 02/14/2016 showed 2 juxtacortical left frontal lobe lesions.  Both were present on the 06/30/2015 MRI.  There was punctate enhancement adjacent to the more anterior frontal focus that had not been present on the previous MRI.  Diffusion weighted images were normal.  Between 2017 and 2019, she was seen multiple times for right-sided headaches.  Intranasal sphenopalatine ganglion block or up of rapid benefit.  Additionally verapamil seem to reduce the frequency.  Update 02/05/2018: She reports that headaches are back. They occur daily with migrainous features of nausea and photophobia/phonophobia.   They returned over a month ago.    She also notes that the right eye becomes red.     She feels stress worsens the headaches.     Movements worsen them.    She is taking Imitrex with benefit but  headaches return.       She is on verapamil and that seemed to help her some for a while.     Headaches occur 30/30 of the last days and for > 4 hours/day.       Last sphenocath procedure was April 2019 and it knocked headaches out completely x 1 month and then they returned mildly.     She has a h/o central retinal vein occlusion and is on drops ([facility])  She had a single seizure in March 2016 and is not on an anticonvulsant.    Insomnia is doing well on her medications.     Update 10/25/2017: Her headaches are doing better since the sphenocath procedure and the trigger point injections.    She still has some headache pain but it is not severe.    Pain sometimes builds up some as the day goes on, especially at work.     She denies any numbness, weakness or gait change.  She sees [facility] for her h/o central retinal vein occlusion.  She is still on eye drops.      She only had one seizure 3 years ago and is not on any anticonvulsant.       She has some insomnia.    She has trouble falling asleep and staying asleep.   She changed her clonazepma to 0.25 qAM and 0.75 mg qPM.  She has ovarian cancer and had surgery.   She had ovarian torsion but pathology showed possible cancer so she is going to have another operation.      Update 06/27/2017: HA's worsened again last week.    The conjunctivitis returned the next day.   All the pain is on her right side only.     In the past sphenopalatine ganglion blocks have helped the most.    She also has received some benefit form ONB/TPIs in the past.    She denies any additional seizures.      Insomnia was doing better and was helped by clonazepam.  However, she is doing worse since the headache has recurred.  Clonazepam also helps her anxiety.   Update 04/20/2017: Headaches are doing fairly well.   She is now having 3-4 HA's a month.    Verapamil has helped reduce the frequency of headaches.     When needed, Imitrex often helps.     She will  sometimes try Tylenol first.   Due to low platelets, she can't take NSAIDs.     The TCPenia was felt to be due to fibroids and these are being treated.     She still gets redness in the right eye, not always associated with HA.    In the past when her headaches were more severe, she had the Sphenocath procedure and it was very beneficial.    Occipital nerve blocks or TPI's have also been beneficial in the past.    She has not had any more seizure.   She just had one in 2016   From 07/11/2016: HA:   Her headaches were doing much better but they are have been occurring more the past month or two. Additionally she continues to have conjunctival redness on the right.     They greatly improved after starting Verapamil and having occipital nerve block/splenius capitis muscle injection at the first visit and then [place] at the second, third and fourth visits.  After the second Sphenocath, the conjunctival redness got much better but then returned. After the fourth procedure, she went nearly pain-free for 6 months until milder headaches returned and activities slowly worsening. Pain improves  with Imitrex and rest much of the time.  She no longer can take NSAID    She tolerates the verapamil well but has occasional constipation .     Studies:   MRI of the brain showed a single small T2/FLAIR hyperintense focus in the left frontal subcortical white matter. The MR venogram was normal. ESR and ANA were normal.    Mood:  Anxiety is much better.   Clonazepam with Buspar has helped better than the escitalopram.    Seizure:   In March 2016, she had a seizure.   She had an aura of numbness in her right hand and then passed out.   She is not sure there was any generalized tonic-clonic activity. Her children did not tell her that she had any. However, she did have urinary incontinence. She does not remember much that day.    Since then, she has not had any more seizures.  She has not needed any AED as she had only the  one seizure/spell.       Insomnia:   She is sleeping better since starting clonazepam and having much less headache  Thrombocytopenia:   Her platelet count was reduced to 78,000 and she saw her doctor in hematology. Repeat platelet count was 83,000. She was advised to stop anti-inflammatories.  REVIEW OF SYSTEMS: Constitutional: No fevers, chills, sweats, or change in appetite.  She has insomnia. Eyes: as above Ear, nose and throat: No hearing loss, ear pain, nasal congestion, sore throat Cardiovascular: No chest pain, palpitations Respiratory:  No shortness of breath at rest or with exertion.   No wheezes GastrointestinaI: No nausea, vomiting, diarrhea, abdominal pain, fecal incontinence Genitourinary:  No dysuria, urinary retention or frequency.  No nocturia.   Possible ovarian cancer Musculoskeletal:  No neck pain, back pain Integumentary: No rash, pruritus, skin lesions Neurological: as above Psychiatric: No depression at this time.  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 Allergic/Immunologic: No itchy/runny eyes, nasal congestion, recent allergic reactions, rashes  ALLERGIES: Allergies  Allergen Reactions   Ivp Dye [Iodinated Diagnostic Agents] Hives, Itching and Swelling   Bee Venom Swelling    HOME MEDICATIONS:  Current Outpatient Medications:    Accu-Chek Softclix Lancets lancets, Use as instructed, Disp: 100 each, Rfl: 12   Acetaminophen-Codeine 300-30 MG tablet, ONE TO TWO AS NEEDED FOR HEADACHE. NO MORE THAN 4/DAY, Disp: 28 tablet, Rfl: 1   Ascorbic Acid (VITAMIN C) 1000 MG tablet, Take 1,000 mg by mouth daily., Disp: , Rfl:    atropine 1 % ophthalmic solution, Place 1 drop into the right eye 3 (three) times daily. , Disp: , Rfl:    blood glucose meter kit and  supplies, Dispense based on patient and insurance preference. Use to check fasting blood sugar and 2 hrs after largest meal. (FOR ICD-10 E10.9, E11.9)., Disp: 1 each, Rfl: 0   brimonidine (ALPHAGAN P) 0.1 % SOLN, Place 1 drop into the right eye 3 (three) times daily. , Disp: , Rfl:    calcium carbonate (TUMS EX) 750 MG chewable tablet, Chew 2 tablets by mouth daily as needed for heartburn. , Disp: , Rfl:    clonazePAM (KLONOPIN) 1 MG tablet, Take 1 tablet (1 mg total) by mouth 2 (two) times daily as needed for anxiety (sleep)., Disp: 180 tablet, Rfl: 1   dorzolamide-timolol (COSOPT) 22.3-6.8 MG/ML ophthalmic solution, Place 1 drop into the right eye 2 (two) times daily. , Disp: , Rfl:    fluticasone (FLONASE) 50 MCG/ACT nasal spray, Place 1-2 sprays into both nostrils daily as needed for allergies. , Disp: , Rfl:    glucose blood (ACCU-CHEK GUIDE) test strip, Use to check fasting glucose and 2 hours after largest meal, Disp: 100 each, Rfl: 12   Lacosamide 100 MG TABS, Take 1 tablet (100 mg total) by mouth 2 (two) times daily., Disp: 180 tablet, Rfl: 1   latanoprost (XALATAN) 0.005 % ophthalmic solution, Place 1 drop into both eyes at bedtime., Disp: , Rfl:    metFORMIN (GLUCOPHAGE) 500 MG tablet, Take 1 tablet by mouth two times daily with a meal x 1 week. Then take 2 tablets by mouth two times daily with a meal., Disp: 120 tablet, Rfl: 2   nystatin cream (MYCOSTATIN), Apply 1 application topically 2 (two) times daily. Apply to affected area BID for up to 7 days., Disp: 30 g, Rfl: 1   predniSONE (DELTASONE) 10 MG tablet, Take 1 tablet (10 mg total) by mouth daily with breakfast., Disp: 100 tablet, Rfl: 3   SUMAtriptan (IMITREX) 100 MG tablet, Take 1 tablet (100 mg total) by mouth once as needed for migraine. May repeat in 2 hours if headache persists or recurs., Disp: 18 tablet, Rfl: 3   verapamil (CALAN-SR) 240 MG CR tablet, TAKE 1 TABLET BY MOUTH AT BEDTIME., Disp: 90 tablet, Rfl: 3   Vitamin D,  Ergocalciferol, (DRISDOL) 1.25 MG (50000 UNIT) CAPS capsule, Take 1 capsule (50,000 Units total) by mouth every 7 (seven) days., Disp: 12 capsule, Rfl: 0   vortioxetine HBr (TRINTELLIX) 10 MG TABS tablet, Take 3 tablets (30 mg total) by mouth daily., Disp: 270 tablet, Rfl: 1  PAST MEDICAL HISTORY: Past Medical History:  Diagnosis Date   Anxiety    Blind right eye 2009   swelling & pressure   Cluster headaches    started after seizure  and migraines   Complication of anesthesia    trouble with short term memory after surgeries   COVID-19    Depression    Fibroids    s/p TLH, bilateral salpingectomy   GERD (gastroesophageal reflux disease)    patient thinks due to medications   History of anemia    prior to hysterectomy   History of pneumonia    History of trichomoniasis    Seizures ([place]) x 1 2-3 yrs ago none since   once saw neurologist, full body brain MRI and another 6 months lster   Status post right oophorectomy    Thrombocytopenia ([place])    sees [place] with Heme Onc    PAST SURGICAL HISTORY: Past Surgical History:  Procedure Laterality Date   CYSTOSCOPY N/A 07/31/2017   Procedure: CYSTOSCOPY;  Surgeon: Megan Salon, MD;  Location: [facility];  Service: Gynecology;  Laterality: N/A;   ENDOMETRIAL ABLATION     EYE SURGERY     laser - removed blood from right eye   HYSTEROSCOPY WITH D & C     LAPAROSCOPY N/A 09/19/2017   Procedure: LAPAROSCOPY DIAGNOSTIC WITH RIGHT OOPHERECTOMY, LYSIS OF ADHESIONS;  Surgeon: Salvadore Dom, MD;  Location: [facility];  Service: Gynecology;  Laterality: N/A;   OMENTECTOMY N/A 11/21/2017   Procedure: OMENTECTOMY AND PELVIC WASHINGS;  Surgeon: Isabel Caprice, MD;  Location: [facility];  Service: Gynecology;  Laterality: N/A;   ROBOTIC ASSISTED SALPINGO OOPHERECTOMY Left 11/21/2017   Procedure: XI ROBOTIC ASSISTED LEFT SALPINGO OOPHORECTOMY;  Surgeon: Isabel Caprice, MD;  Location: [facility];  Service: Gynecology;  Laterality: Left;   TOTAL LAPAROSCOPIC  HYSTERECTOMY WITH SALPINGECTOMY Bilateral 07/31/2017   Procedure: TOTAL LAPAROSCOPIC HYSTERECTOMY WITH SALPINGECTOMY;  Surgeon: Megan Salon, MD;  Location: [facility];  Service: Gynecology;  Laterality: Bilateral;  20 week size uterus/ Alexis bag in room/ need 4.5 hours   TUBAL LIGATION     WISDOM TOOTH EXTRACTION      FAMILY HISTORY: Family History  Problem Relation Age of Onset   Alcoholism Father    Cirrhosis Father    Alcohol abuse Father    Cancer Maternal Grandmother        unsure of type; died of other causes   Heart disease Maternal Aunt    Drug abuse Cousin     SOCIAL HISTORY:  Social History   Socioeconomic History   Marital status: Single    Spouse name: Not on file   Number of children: Not on file   Years of education: Not on file   Highest education level: Not on file  Occupational History   Not on file  Tobacco Use   Smoking status: Former    Years: 20.00    Pack years: 0.00    Types: Cigarettes, E-cigarettes    Quit date: 05/05/2018    Years since quitting: 2.2   Smokeless tobacco: Never  Vaping Use   Vaping Use: Former  Substance and Sexual Activity   Alcohol use: Not Currently   Drug use: No   Sexual activity: Yes    Birth control/protection: Surgical    Comment: hysterectomy  Other Topics Concern   Not on file  Social History Narrative   Not on file   Social Determinants of Health   Financial Resource Strain: Not on file  Food Insecurity: Not on file  Transportation Needs: Not on file  Physical Activity: Not on file  Stress: Not on file  Social Connections: Not on file  Intimate  Partner Violence: Not on file     PHYSICAL EXAM  Vitals:   08/16/20 1534  BP: (!) 146/87  Pulse: 68  Weight: 197 lb 8 oz (89.6 kg)  Height: 5' (1.524 m)    Body mass index is 38.57 kg/m.   General: The patient is well-developed and well-nourished and in no acute distress.  Mallampati 2  Eyes:   She has mild conjunctival erythema on the right..   Normal funduscopic examination.  Neck: The neck is tender over the right occiput and splenius capitis muscle.  Range of motion is fairly normal  Neurologic Exam  Mental status: The patient is alert and oriented x 3 at the time of the examination. The patient has apparent normal recent and remote memory, with an apparently normal attention span and concentration ability.   Speech is normal.  Cranial nerves: Extraocular movements are full.  She has very mild right ptosis and facial strength is normal elsewhere.  There is normal facial sensation to soft touch.   Trapezius and sternocleidomastoid strength is normal. No dysarthria is noted.  The tongue is midline, and the patient has symmetric elevation of the soft palate. No obvious hearing deficits are noted.  Motor:  Muscle bulk is normal.   Tone is normal. Strength is  5 / 5 in all 4 extremities.   Sensory: She has normal sensation to touch and vibration in the arms and legs   Gait and station: Station is normal.   Gait is normal and the tandem gait is mildly wide.  Romberg negative  DTRs:  Normal and symmetric    DIAGNOSTIC DATA (LABS, IMAGING, TESTING) - I reviewed patient records, labs, notes, testing and imaging myself where available.  Lab Results  Component Value Date   WBC 7.4 07/09/2020   HGB 13.6 07/09/2020   HCT 40.0 07/09/2020   MCV 89 07/09/2020   PLT 66 (LL) 07/09/2020      Component Value Date/Time   NA 140 07/09/2020 0958   NA 138 08/31/2016 0957   K 3.6 07/09/2020 0958   K 3.9 08/31/2016 0957   CL 103 07/09/2020 0958   CO2 18 (L) 07/09/2020 0958   CO2 22 08/31/2016 0957   GLUCOSE 196 (H) 07/09/2020 0958   GLUCOSE 148 (H) 07/10/2018 0915   GLUCOSE 106 08/31/2016 0957   BUN 7 07/09/2020 0958   BUN 8.6 08/31/2016 0957   CREATININE 0.64 07/09/2020 0958   CREATININE 0.8 08/31/2016 0957   CALCIUM 9.6 07/09/2020 0958   CALCIUM 10.3 08/31/2016 0957   PROT 7.0 07/09/2020 0958   PROT 7.6 08/31/2016 0957    ALBUMIN 4.6 07/09/2020 0958   ALBUMIN 3.9 08/31/2016 0957   AST 43 (H) 07/09/2020 0958   AST 18 08/31/2016 0957   ALT 87 (H) 07/09/2020 0958   ALT 23 08/31/2016 0957   ALKPHOS 66 07/09/2020 0958   ALKPHOS 62 08/31/2016 0957   BILITOT 0.2 07/09/2020 0958   BILITOT 0.43 08/31/2016 0957   GFRNONAA 111 02/04/2019 0902   GFRAA 128 02/04/2019 0902         PROCEDURE   The patient was placed in the supine position. A temperature strip was added to the cheek area after the area was cleaned with alcohol. The Sphenocath was lubricated with gel, and placed in the right naris. The catheter was inserted above the middle turbinate to the posterior nasal cavity, and then withdrawn 1 cm. The catheter was deployed and rotated approximately 20 towards the nose. 2-1/2 mL of  2% lidocaine was placed. The patient was asked to swallow during the injection. The patient demonstrated erythema of the sclera of the eye on this side, and an increase in the cheek temperature was noted from 32C to 34.0 C.   The patient tolerated the procedure well. No complications of the procedure were noted. The patient was kept in the supine position for 2-3 minutes following the procedure. She was given small sips of water after sitting up following the procedure.  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[place]) - Plan: CBC with Differential/Platelet, Comprehensive metabolic panel, Hepatitis B surface antigen, HIV Antibody (routine testing w rflx), IgG, IgA, IgM, Varicella zoster antibody, IgG, QuantiFERON-TB Gold Plus, Hepatitis B core antibody, total, Hepatitis C antibody, Hepatitis B surface antibody,qualitative  Seizure (HCC)  Vascular headache  Mood disorder (HCC)  Type 2 diabetes mellitus without complication, without long-term current use of insulin (HCC)  High risk medication use - Plan: CBC with Differential/Platelet, Comprehensive metabolic panel, Hepatitis B  surface antigen, HIV Antibody (routine testing w rflx), IgG, IgA, IgM, Varicella zoster antibody, IgG, QuantiFERON-TB Gold Plus, Hepatitis B core antibody, total, Hepatitis C antibody, Hepatitis B surface antibody,qualitative  OSA (obstructive sleep apnea) - Plan: PSG SLEEP STUDY   1.   She has had more symptoms and due to recent diagnosis of DM, we can't easily push her steroids.   I discussed Rituxan with her -- we could continue or consider MMF or azathioprine longer term.  If symptoms worsen, re-image.  2.   Continue Vimpat for seizures.   Continue Trintellix/clonazepam for mood 3.   Prednisone 5 mg daily.  Consider taper after she begins Rituxan 4.   TPI 40 mg depo-medrol in [place] into the right splenius capitus and splenius cervicus muscles 5.   SPG injection with 2% lidocaine as detailed above 6.   Return in 6 months.  She will call if she has new or worsening symptoms.    50-minute office visit with the majority of the time spent face-to-face for history and physical, discussion/counseling and decision-making.  Additional time with record review and documentation.   Kele Withem A. Felecia Shelling, MD, PhD 5/00/9381, 8:29 PM Certified in Neurology, Clinical Neurophysiology, Sleep Medicine, Pain Medicine and Neuroimaging   [facility] [address], [place], [place] [address] [phone]

## 2020-08-17 ENCOUNTER — Telehealth: Payer: Self-pay | Admitting: *Deleted

## 2020-08-17 NOTE — Telephone Encounter (Signed)
Gave completed/signed Rituxan order to intrafusion to process.  Order: (whichever brand insurance will approve-either Rituxan, Truxima, Ruxience)  Initial treatment: 1000mg  IV Day 1 and Day 15, mix in 234ml NS for final concentration of 4mg /ml Ongoing treatment: 1000mg  IV every 24 weeks, mix in 275ml NS for final concentration of 45ml/ml Premeds: Acetaminophen 650mg  po 30 min prior to infusion, diphenhydramine 25mg  IVP 30 min prior to infusion, methylprednisolone 60mg  IV 30 min prior to infusion

## 2020-08-18 ENCOUNTER — Telehealth: Payer: Self-pay | Admitting: *Deleted

## 2020-08-18 NOTE — Telephone Encounter (Signed)
Called pt. Relayed results per AA,MD note. Pt verbalized understanding. She will f/u w/ PCP about elevated liver enzymes. She was not aware of this. She will limit alcohol consumption. Aware TB test still pending, we will call with those results once back.   Aware we have to clear all labs first before they can submit for auth for Rituxan. Once approved, infusion suite will call her to schedule once they have approval.

## 2020-08-18 NOTE — Telephone Encounter (Signed)
-----   Message from Melvenia Beam, MD sent at 08/18/2020 12:47 PM EDT ----- Platlets are decreased but stable compared to last several tests, this appears chronic.Also, her liver enzymes are slightly elevated similar to last test a month ago . I would follow up with primary care for both of these. All other testing so far normal but still pending a few results thanks.

## 2020-08-19 LAB — CBC WITH DIFFERENTIAL/PLATELET
Basophils Absolute: 0 10*3/uL (ref 0.0–0.2)
Basos: 1 %
EOS (ABSOLUTE): 0.1 10*3/uL (ref 0.0–0.4)
Eos: 1 %
Hematocrit: 42 % (ref 34.0–46.6)
Hemoglobin: 13.9 g/dL (ref 11.1–15.9)
Immature Grans (Abs): 0 10*3/uL (ref 0.0–0.1)
Immature Granulocytes: 1 %
Lymphocytes Absolute: 2 10*3/uL (ref 0.7–3.1)
Lymphs: 31 %
MCH: 29.9 pg (ref 26.6–33.0)
MCHC: 33.1 g/dL (ref 31.5–35.7)
MCV: 90 fL (ref 79–97)
Monocytes Absolute: 0.3 10*3/uL (ref 0.1–0.9)
Monocytes: 5 %
Neutrophils Absolute: 3.9 10*3/uL (ref 1.4–7.0)
Neutrophils: 61 %
Platelets: 71 10*3/uL — CL (ref 150–450)
RBC: 4.65 x10E6/uL (ref 3.77–5.28)
RDW: 13.1 % (ref 11.7–15.4)
WBC: 6.4 10*3/uL (ref 3.4–10.8)

## 2020-08-19 LAB — IGG, IGA, IGM
IgA/Immunoglobulin A, Serum: 345 mg/dL (ref 87–352)
IgG (Immunoglobin G), Serum: 788 mg/dL (ref 586–1602)
IgM (Immunoglobulin M), Srm: 31 mg/dL (ref 26–217)

## 2020-08-19 LAB — COMPREHENSIVE METABOLIC PANEL
ALT: 98 IU/L — ABNORMAL HIGH (ref 0–32)
AST: 54 IU/L — ABNORMAL HIGH (ref 0–40)
Albumin/Globulin Ratio: 2 (ref 1.2–2.2)
Albumin: 5 g/dL — ABNORMAL HIGH (ref 3.8–4.8)
Alkaline Phosphatase: 64 IU/L (ref 44–121)
BUN/Creatinine Ratio: 11 (ref 9–23)
BUN: 7 mg/dL (ref 6–24)
Bilirubin Total: 0.3 mg/dL (ref 0.0–1.2)
CO2: 21 mmol/L (ref 20–29)
Calcium: 10.4 mg/dL — ABNORMAL HIGH (ref 8.7–10.2)
Chloride: 101 mmol/L (ref 96–106)
Creatinine, Ser: 0.64 mg/dL (ref 0.57–1.00)
Globulin, Total: 2.5 g/dL (ref 1.5–4.5)
Glucose: 179 mg/dL — ABNORMAL HIGH (ref 65–99)
Potassium: 4 mmol/L (ref 3.5–5.2)
Sodium: 140 mmol/L (ref 134–144)
Total Protein: 7.5 g/dL (ref 6.0–8.5)
eGFR: 110 mL/min/{1.73_m2} (ref >59)

## 2020-08-19 LAB — HEPATITIS B SURFACE ANTIBODY,QUALITATIVE: Hep B Surface Ab, Qual: NONREACTIVE

## 2020-08-19 LAB — HEPATITIS C ANTIBODY: Hep C Virus Ab: <0.1 s/co ratio (ref 0.0–0.9)

## 2020-08-19 LAB — QUANTIFERON-TB GOLD PLUS
QuantiFERON Mitogen Value: >10 IU/mL
QuantiFERON Nil Value: 0 IU/mL
QuantiFERON TB1 Ag Value: 0.01 IU/mL
QuantiFERON TB2 Ag Value: 0.02 IU/mL
QuantiFERON-TB Gold Plus: NEGATIVE

## 2020-08-19 LAB — HEPATITIS B CORE ANTIBODY, TOTAL: Hep B Core Total Ab: NEGATIVE

## 2020-08-19 LAB — HEPATITIS B SURFACE ANTIGEN: Hepatitis B Surface Ag: NEGATIVE

## 2020-08-19 LAB — HIV ANTIBODY (ROUTINE TESTING W REFLEX): HIV Screen 4th Generation wRfx: NONREACTIVE

## 2020-08-19 LAB — VARICELLA ZOSTER ANTIBODY, IGG: Varicella zoster IgG: 453 index (ref >165)

## 2020-08-19 NOTE — Telephone Encounter (Signed)
Called pt. Advised TB test came back negative. Lab results given to intrafusion to process Rituxan order.  She asked about sleep study that was ordered. Advised they will have to also get approval via her insurance and then they will call her to get this set up. She verbalized understanding. Spoke w/ Meagan and confirmed they have order and are working on it.

## 2020-08-24 ENCOUNTER — Telehealth (INDEPENDENT_AMBULATORY_CARE_PROVIDER_SITE_OTHER): Payer: PRIVATE HEALTH INSURANCE | Admitting: Gastroenterology

## 2020-08-24 DIAGNOSIS — Z1211 Encounter for screening for malignant neoplasm of colon: Secondary | ICD-10-CM

## 2020-08-24 MED ORDER — NA SULFATE-K SULFATE-MG SULF 17.5-3.13-1.6 GM/177ML PO SOLN: 1.0000 | Freq: Once | ORAL | 0 refills | Status: AC

## 2020-08-24 NOTE — Progress Notes (Signed)
Gastroenterology Pre-Procedure Review  Request Date: 09/22/20 Requesting Physician: Dr. Marius Ditch  PATIENT REVIEW QUESTIONS: The patient responded to the following health history questions as indicated:    1. Are you having any GI issues? no 2. Do you have a personal history of Polyps? no 3. Do you have a family history of Colon Cancer or Polyps? no 4. Diabetes Mellitus? no 5. Joint replacements in the past 12 months?no 6. Major health problems in the past 3 months?no 7. Any artificial heart valves, MVP, or defibrillator?no    MEDICATIONS & ALLERGIES:    Patient reports the following regarding taking any anticoagulation/antiplatelet therapy:   Plavix, Coumadin, Eliquis, Xarelto, Lovenox, Pradaxa, Brilinta, or Effient? no Aspirin? no  Patient confirms/reports the following medications:  Current Outpatient Medications  Medication Sig Dispense Refill   Accu-Chek Softclix Lancets lancets Use as instructed 100 each 12   Acetaminophen-Codeine 300-30 MG tablet ONE TO TWO AS NEEDED FOR HEADACHE. NO MORE THAN 4/DAY 28 tablet 1   Ascorbic Acid (VITAMIN C) 1000 MG tablet Take 1,000 mg by mouth daily.     atropine 1 % ophthalmic solution Place 1 drop into the right eye 3 (three) times daily.      blood glucose meter kit and supplies Dispense based on patient and insurance preference. Use to check fasting blood sugar and 2 hrs after largest meal. (FOR ICD-10 E10.9, E11.9). 1 each 0   brimonidine (ALPHAGAN P) 0.1 % SOLN Place 1 drop into the right eye 3 (three) times daily.      calcium carbonate (TUMS EX) 750 MG chewable tablet Chew 2 tablets by mouth daily as needed for heartburn.      clonazePAM (KLONOPIN) 1 MG tablet Take 1 tablet (1 mg total) by mouth 2 (two) times daily as needed for anxiety (sleep). 180 tablet 1   dorzolamide-timolol (COSOPT) 22.3-6.8 MG/ML ophthalmic solution Place 1 drop into the right eye 2 (two) times daily.      fluticasone (FLONASE) 50 MCG/ACT nasal spray Place 1-2 sprays  into both nostrils daily as needed for allergies.      glucose blood (ACCU-CHEK GUIDE) test strip Use to check fasting glucose and 2 hours after largest meal 100 each 12   Lacosamide 100 MG TABS Take 1 tablet (100 mg total) by mouth 2 (two) times daily. 180 tablet 1   latanoprost (XALATAN) 0.005 % ophthalmic solution Place 1 drop into both eyes at bedtime.     metFORMIN (GLUCOPHAGE) 500 MG tablet Take 1 tablet by mouth two times daily with a meal x 1 week. Then take 2 tablets by mouth two times daily with a meal. 120 tablet 2   nystatin cream (MYCOSTATIN) Apply 1 application topically 2 (two) times daily. Apply to affected area BID for up to 7 days. 30 g 1   predniSONE (DELTASONE) 10 MG tablet Take 1 tablet (10 mg total) by mouth daily with breakfast. 100 tablet 3   SUMAtriptan (IMITREX) 100 MG tablet Take 1 tablet (100 mg total) by mouth once as needed for migraine. May repeat in 2 hours if headache persists or recurs. 18 tablet 3   verapamil (CALAN-SR) 240 MG CR tablet TAKE 1 TABLET BY MOUTH AT BEDTIME. 90 tablet 3   Vitamin D, Ergocalciferol, (DRISDOL) 1.25 MG (50000 UNIT) CAPS capsule Take 1 capsule (50,000 Units total) by mouth every 7 (seven) days. 12 capsule 0   vortioxetine HBr (TRINTELLIX) 10 MG TABS tablet Take 3 tablets (30 mg total) by mouth daily. 270 tablet 1  No current facility-administered medications for this visit.    Patient confirms/reports the following allergies:  Allergies  Allergen Reactions   Ivp Dye [Iodinated Diagnostic Agents] Hives, Itching and Swelling   Bee Venom Swelling    No orders of the defined types were placed in this encounter.   AUTHORIZATION INFORMATION Primary Insurance: 1D#: Group #:  Secondary Insurance: 1D#: Group #:  SCHEDULE INFORMATION: Date: 09/22/20 Time: Location:[facility]

## 2020-09-02 ENCOUNTER — Other Ambulatory Visit: Payer: Self-pay

## 2020-09-02 ENCOUNTER — Other Ambulatory Visit: Payer: PRIVATE HEALTH INSURANCE

## 2020-09-02 DIAGNOSIS — R748 Abnormal levels of other serum enzymes: Secondary | ICD-10-CM

## 2020-09-02 DIAGNOSIS — R899 Unspecified abnormal finding in specimens from other organs, systems and tissues: Secondary | ICD-10-CM

## 2020-09-03 LAB — COMPREHENSIVE METABOLIC PANEL
ALT: 95 IU/L — ABNORMAL HIGH (ref 0–32)
AST: 49 IU/L — ABNORMAL HIGH (ref 0–40)
Albumin/Globulin Ratio: 2.1 (ref 1.2–2.2)
Albumin: 5 g/dL — ABNORMAL HIGH (ref 3.8–4.8)
Alkaline Phosphatase: 59 IU/L (ref 44–121)
BUN/Creatinine Ratio: 13 (ref 9–23)
BUN: 9 mg/dL (ref 6–24)
Bilirubin Total: 0.4 mg/dL (ref 0.0–1.2)
CO2: 21 mmol/L (ref 20–29)
Calcium: 9.9 mg/dL (ref 8.7–10.2)
Chloride: 101 mmol/L (ref 96–106)
Creatinine, Ser: 0.71 mg/dL (ref 0.57–1.00)
Globulin, Total: 2.4 g/dL (ref 1.5–4.5)
Glucose: 123 mg/dL — ABNORMAL HIGH (ref 65–99)
Potassium: 3.9 mmol/L (ref 3.5–5.2)
Sodium: 141 mmol/L (ref 134–144)
Total Protein: 7.4 g/dL (ref 6.0–8.5)
eGFR: 106 mL/min/{1.73_m2} (ref >59)

## 2020-09-03 NOTE — Addendum Note (Signed)
Addended by: Mickel Crow on: 09/03/2020 12:12 PM   Modules accepted: Orders

## 2020-09-08 ENCOUNTER — Other Ambulatory Visit: Payer: Self-pay

## 2020-09-08 ENCOUNTER — Ambulatory Visit (INDEPENDENT_AMBULATORY_CARE_PROVIDER_SITE_OTHER): Payer: PRIVATE HEALTH INSURANCE | Admitting: Obstetrics & Gynecology

## 2020-09-08 ENCOUNTER — Encounter (HOSPITAL_BASED_OUTPATIENT_CLINIC_OR_DEPARTMENT_OTHER): Payer: Self-pay | Admitting: Obstetrics & Gynecology

## 2020-09-08 ENCOUNTER — Ambulatory Visit (HOSPITAL_BASED_OUTPATIENT_CLINIC_OR_DEPARTMENT_OTHER)
Admission: RE | Admit: 2020-09-08 | Discharge: 2020-09-08 | Disposition: A | Discharge status: Home / Self Care | Payer: 59 | Source: Ambulatory Visit | Attending: Obstetrics & Gynecology | Admitting: Obstetrics & Gynecology

## 2020-09-08 VITALS — BP 129/87 | HR 69 | Ht 59.75 in | Wt 191.0 lb

## 2020-09-08 DIAGNOSIS — D3910 Neoplasm of uncertain behavior of unspecified ovary: Secondary | ICD-10-CM | POA: Diagnosis not present

## 2020-09-08 DIAGNOSIS — Z1231 Encounter for screening mammogram for malignant neoplasm of breast: Secondary | ICD-10-CM

## 2020-09-08 DIAGNOSIS — D696 Thrombocytopenia, unspecified: Secondary | ICD-10-CM

## 2020-09-08 DIAGNOSIS — Z01419 Encounter for gynecological examination (general) (routine) without abnormal findings: Secondary | ICD-10-CM

## 2020-09-08 DIAGNOSIS — G44029 Chronic cluster headache, not intractable: Secondary | ICD-10-CM

## 2020-09-08 DIAGNOSIS — F410 Panic disorder [episodic paroxysmal anxiety] without agoraphobia: Secondary | ICD-10-CM

## 2020-09-08 DIAGNOSIS — F39 Unspecified mood [affective] disorder: Secondary | ICD-10-CM

## 2020-09-08 DIAGNOSIS — B3789 Other sites of candidiasis: Secondary | ICD-10-CM

## 2020-09-08 DIAGNOSIS — F324 Major depressive disorder, single episode, in partial remission: Secondary | ICD-10-CM

## 2020-09-08 IMAGING — MG MM DIGITAL SCREENING BILAT W/ TOMO AND CAD
8 series · 8 of 24 positions shown · non-contrast
Comparison: Previous exam(s).

CLINICAL DATA: Screening.

EXAM:
DIGITAL SCREENING BILATERAL MAMMOGRAM WITH TOMOSYNTHESIS AND CAD
TECHNIQUE: Bilateral screening digital craniocaudal and mediolateral oblique
mammograms were obtained. Bilateral screening digital breast
tomosynthesis was performed. The images were evaluated with
computer-aided detection.

[R MLO synth-2D]
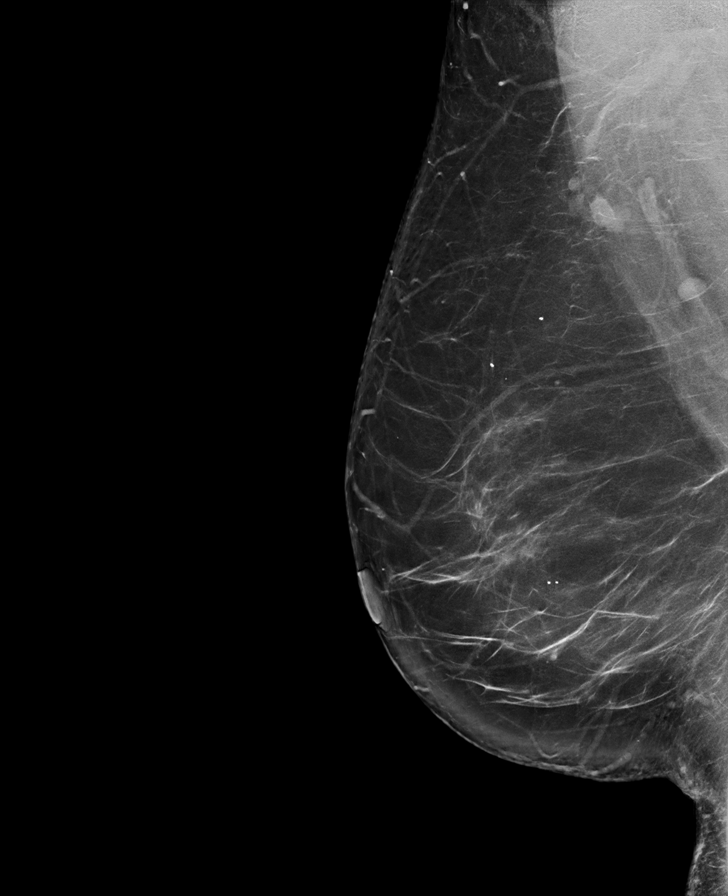

[L CC synth-2D]
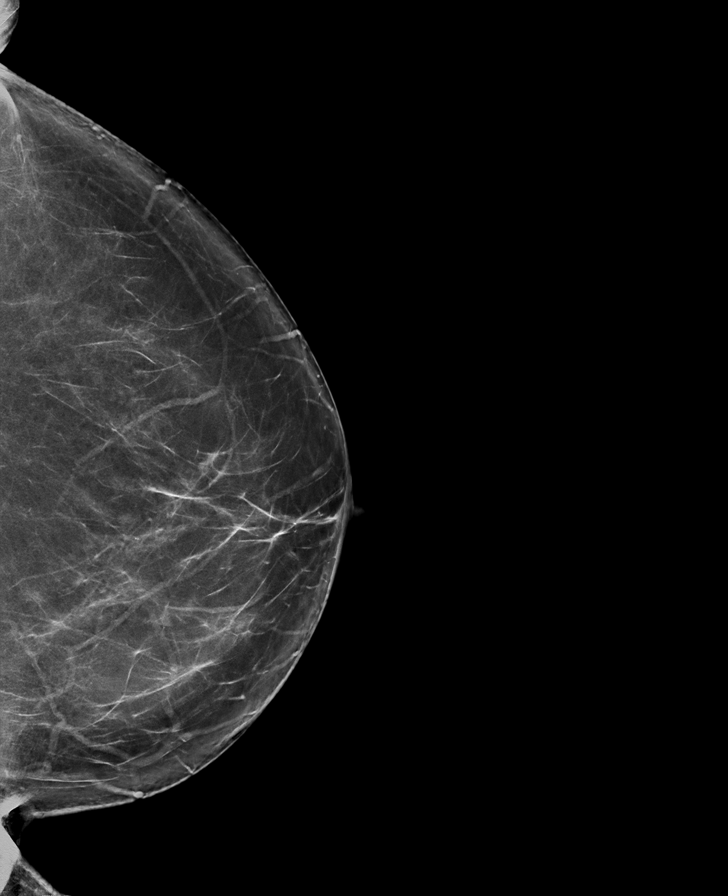

[L MLO synth-2D]
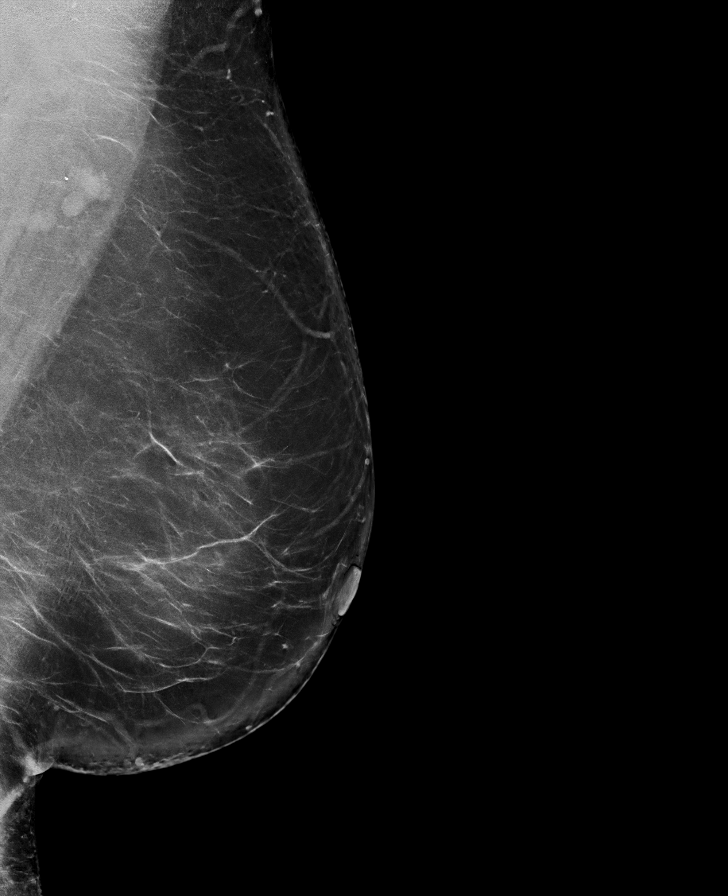

[R CC synth-2D]
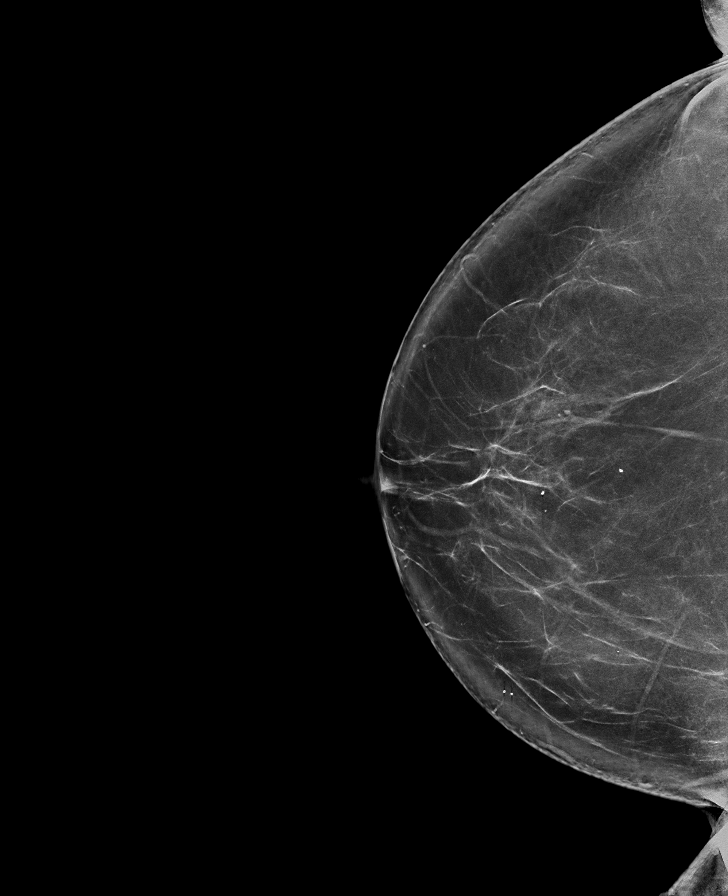

[L CC tomo · tomo slice 43/85.0]
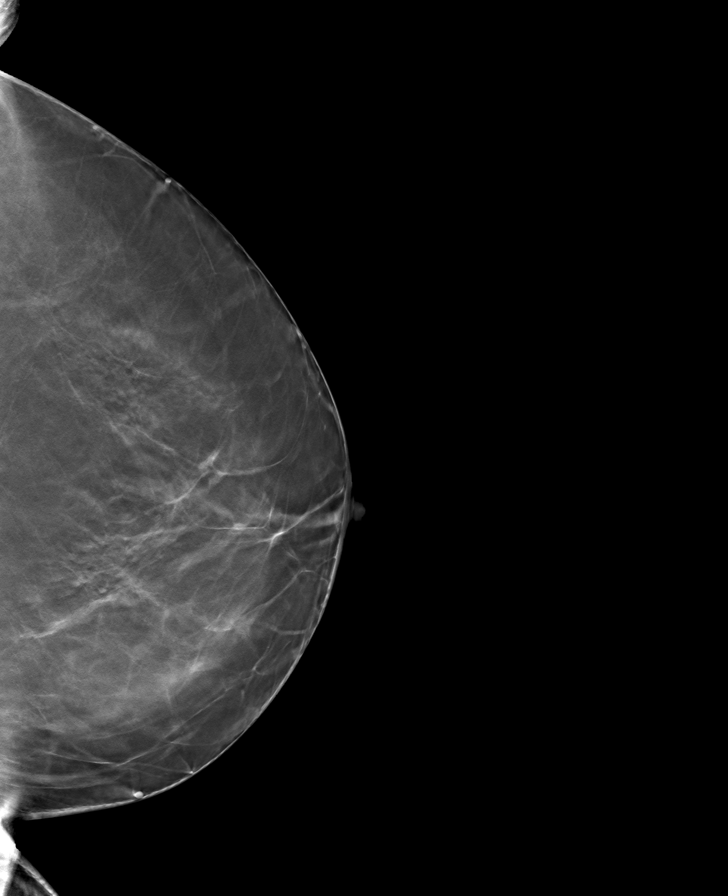

[R MLO tomo · tomo slice 53/104.0]
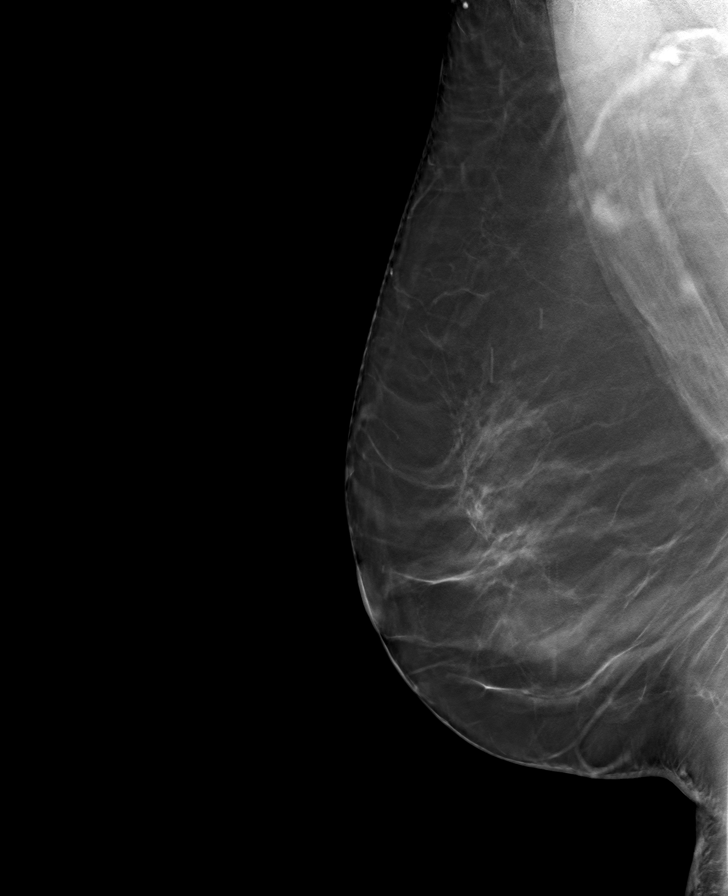

[L MLO tomo · tomo slice 53/106.0]
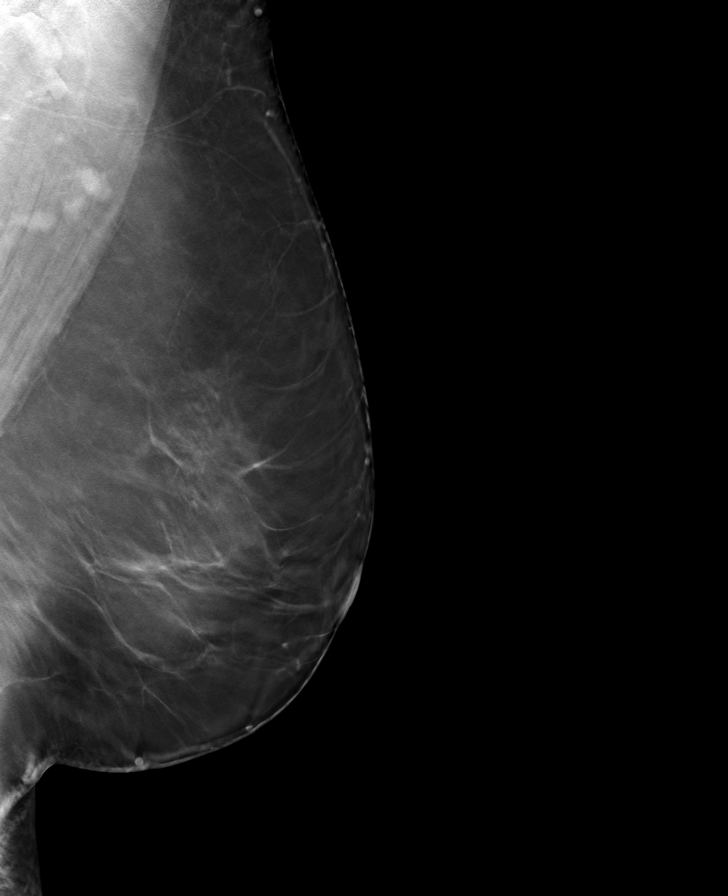

[R CC tomo · tomo slice 47/92.0]
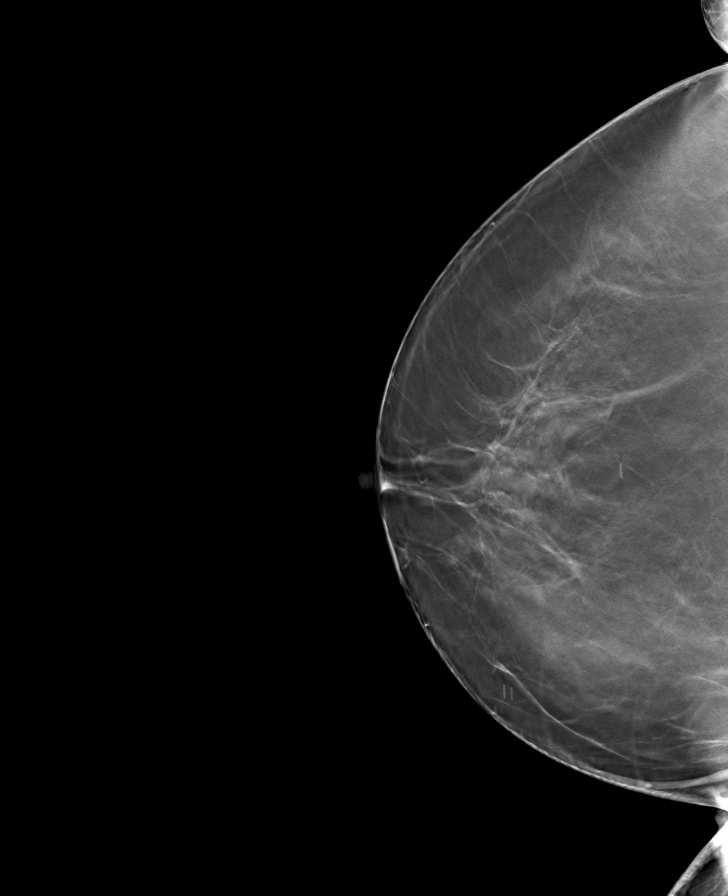

[8 of 24 positions shown; findings below may reference images not displayed]

ACR Breast Density Category b: There are scattered areas of
fibroglandular density.
FINDINGS: There are no findings suspicious for malignancy.
IMPRESSION: No mammographic evidence of malignancy. A result letter of this
screening mammogram will be mailed directly to the patient.

RECOMMENDATION:
Screening mammogram in one year. (Code:[BY])

BI-RADS CATEGORY  1: Negative.

## 2020-09-08 MED ORDER — NYSTATIN 100000 UNIT/GM EX CREA: 1.0000 "application " | TOPICAL_CREAM | Freq: Two times a day (BID) | CUTANEOUS | 1 refills | Status: DC

## 2020-09-08 NOTE — Progress Notes (Signed)
45 y.o. [id number] Single Other or two or more races female here for annual exam.  Denies vaginal bleeding.    Has been diagnosed with vasculitis.  On prednisone $RemoveBefor'10mg'feaPRDqIqGJL$  daily, 1/2 tab daily.  Does have diabetes.  Last hba1c was in May and was 9.2.  Liver enzymes are mildly elevated.  Has ultrasound ordered.  Hasn't been scheduled.  Will be looking for fatty liver.    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  Has not gone back to see gyn/oncology.  States she really doesn't want to.  Discussed last year but never made appt.  Frustrated with weight.  I referred her to [facility] last year.  Didn't decide to proceed with program.  Patient's last menstrual period was 05/02/2017.          Sexually active: Yes.    The current method of family planning is status post hysterectomy.    Exercising: No.  The patient does not participate in regular exercise at present. Smoker:  former smoker  Health Maintenance: Pap:  03/29/2017 Negative with neg HR HPV History of abnormal Pap:  cryo that was >20 years ago MMG:  01/24/16 Colonoscopy:  scheduled for 09/2020 TDaP:  07/2020 Hep C testing: 08/2020 Screening Labs: reviewed from 07/2020 and 08/2020   reports that she quit smoking about 2 years ago. Her smoking use included cigarettes and e-cigarettes. She has never used smokeless tobacco. She reports previous alcohol use. She reports that she does not use drugs.  Past Medical History:  Diagnosis Date   Anxiety    Blind right eye 2009   swelling & pressure   Cluster headaches    started after seizure and migraines   Complication of anesthesia    trouble with short term memory after surgeries   COVID-19    Depression    Fibroids    s/p TLH, bilateral salpingectomy   GERD (gastroesophageal reflux disease)    patient thinks due to  medications   History of anemia    prior to hysterectomy   History of pneumonia    History of trichomoniasis    Seizures ([place]) x 1 2-3 yrs ago none since   once saw neurologist, full body brain MRI and another 6 months lster   Status post right oophorectomy    Thrombocytopenia ([place])    sees [place] with Heme Onc    Past Surgical History:  Procedure Laterality Date   CYSTOSCOPY N/A 07/31/2017   Procedure: CYSTOSCOPY;  Surgeon: Megan Salon, MD;  Location: [facility];  Service: Gynecology;  Laterality: N/A;   ENDOMETRIAL ABLATION     EYE SURGERY     laser - removed blood from right eye   HYSTEROSCOPY WITH D & C     LAPAROSCOPY N/A 09/19/2017   Procedure: LAPAROSCOPY DIAGNOSTIC WITH RIGHT OOPHERECTOMY, LYSIS OF ADHESIONS;  Surgeon: Salvadore Dom, MD;  Location: [facility];  Service: Gynecology;  Laterality: N/A;   OMENTECTOMY N/A 11/21/2017   Procedure: OMENTECTOMY AND PELVIC WASHINGS;  Surgeon: Isabel Caprice, MD;  Location: [facility];  Service: Gynecology;  Laterality: N/A;   ROBOTIC ASSISTED SALPINGO OOPHERECTOMY Left 11/21/2017   Procedure: XI ROBOTIC ASSISTED LEFT SALPINGO OOPHORECTOMY;  Surgeon: Isabel Caprice, MD;  Location: [facility];  Service: Gynecology;  Laterality: Left;   TOTAL LAPAROSCOPIC HYSTERECTOMY WITH SALPINGECTOMY Bilateral 07/31/2017   Procedure: TOTAL LAPAROSCOPIC  HYSTERECTOMY WITH SALPINGECTOMY;  Surgeon: Megan Salon, MD;  Location: [facility];  Service: Gynecology;  Laterality: Bilateral;  20 week size uterus/ Alexis bag in room/ need 4.5 hours   TUBAL LIGATION     WISDOM TOOTH EXTRACTION      Current Outpatient Medications  Medication Sig Dispense Refill   Accu-Chek Softclix Lancets lancets Use as instructed 100 each 12   Acetaminophen-Codeine 300-30 MG tablet ONE TO TWO AS NEEDED FOR HEADACHE. NO MORE THAN 4/DAY 28 tablet 1   Ascorbic Acid (VITAMIN C) 1000 MG tablet Take 1,000 mg by mouth daily.     atropine 1 % ophthalmic solution Place 1 drop into the right eye 3  (three) times daily.      blood glucose meter kit and supplies Dispense based on patient and insurance preference. Use to check fasting blood sugar and 2 hrs after largest meal. (FOR ICD-10 E10.9, E11.9). 1 each 0   brimonidine (ALPHAGAN P) 0.1 % SOLN Place 1 drop into the right eye 3 (three) times daily.      calcium carbonate (TUMS EX) 750 MG chewable tablet Chew 2 tablets by mouth daily as needed for heartburn.      clonazePAM (KLONOPIN) 1 MG tablet Take 1 tablet (1 mg total) by mouth 2 (two) times daily as needed for anxiety (sleep). 180 tablet 1   dorzolamide-timolol (COSOPT) 22.3-6.8 MG/ML ophthalmic solution Place 1 drop into the right eye 2 (two) times daily.      fluticasone (FLONASE) 50 MCG/ACT nasal spray Place 1-2 sprays into both nostrils daily as needed for allergies.      glucose blood (ACCU-CHEK GUIDE) test strip Use to check fasting glucose and 2 hours after largest meal 100 each 12   Lacosamide 100 MG TABS Take 1 tablet (100 mg total) by mouth 2 (two) times daily. 180 tablet 1   latanoprost (XALATAN) 0.005 % ophthalmic solution Place 1 drop into both eyes at bedtime.     metFORMIN (GLUCOPHAGE) 500 MG tablet Take 1 tablet by mouth two times daily with a meal x 1 week. Then take 2 tablets by mouth two times daily with a meal. 120 tablet 2   nystatin cream (MYCOSTATIN) Apply 1 application topically 2 (two) times daily. Apply to affected area BID for up to 7 days. 30 g 1   predniSONE (DELTASONE) 10 MG tablet Take 1 tablet (10 mg total) by mouth daily with breakfast. 100 tablet 3   SUMAtriptan (IMITREX) 100 MG tablet Take 1 tablet (100 mg total) by mouth once as needed for migraine. May repeat in 2 hours if headache persists or recurs. 18 tablet 3   verapamil (CALAN-SR) 240 MG CR tablet TAKE 1 TABLET BY MOUTH AT BEDTIME. 90 tablet 3   Vitamin D, Ergocalciferol, (DRISDOL) 1.25 MG (50000 UNIT) CAPS capsule Take 1 capsule (50,000 Units total) by mouth every 7 (seven) days. 12 capsule 0    vortioxetine HBr (TRINTELLIX) 10 MG TABS tablet Take 3 tablets (30 mg total) by mouth daily. 270 tablet 1   No current facility-administered medications for this visit.    Family History  Problem Relation Age of Onset   Alcoholism Father    Cirrhosis Father    Alcohol abuse Father    Cancer Maternal Grandmother        unsure of type; died of other causes   Heart disease Maternal Aunt    Drug abuse Cousin     Review of Systems  Gastrointestinal:  Positive for diarrhea (  since starting metformin).  Genitourinary:        Incontinence   Exam:   BP 129/87   Pulse 69   Ht 4' 11.75" (1.518 m)   Wt 191 lb (86.6 kg)   LMP 05/02/2017   BMI 37.61 kg/m   Height: 4' 11.75" (151.8 cm)  General appearance: alert, cooperative and appears stated age Head: Normocephalic, without obvious abnormality, atraumatic Neck: no adenopathy, supple, symmetrical, trachea midline and thyroid normal to inspection and palpation Lungs: clear to auscultation bilaterally Breasts: normal appearance, no masses or tenderness Heart: regular rate and rhythm Abdomen: soft, non-tender; bowel sounds normal; no masses,  no organomegaly Extremities: extremities normal, atraumatic, no cyanosis or edema Skin: Skin color, texture, turgor normal. No rashes or lesions Lymph nodes: Cervical, supraclavicular, and axillary nodes normal. No abnormal inguinal nodes palpated Neurologic: Grossly normal   Pelvic: External genitalia:  no lesions              Urethra:  normal appearing urethra with no masses, tenderness or lesions              Bartholins and Skenes: normal                 Vagina: normal appearing vagina with normal color and no discharge, no lesions              Cervix: absent              Pap taken: No. Bimanual Exam:  Uterus:  uterus absent              Adnexa: no mass, fullness, tenderness               Rectovaginal: Confirms               Anus:  normal sphincter tone, no lesions  Chaperone, Octaviano Batty, CMA, was present for exam.  Assessment/Plan: 1. Well woman exam with routine gynecological exam - pap smear not indicated - MMG ordered, last done 01/2016 - colonoscopy is scheduled for 09/2020 per pt - vaccines updated - lab work reviewed  2. 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 Mood disorder ([place]) - Ambulatory referral to Psychiatry  4.  H/o skin candida, not acitve - nystatin cream (MYCOSTATIN); Apply 1 application topically 2 (two) times daily. Apply to affected area BID for up to 7 days.  Dispense: 30 g; Refill: 1  8. Chronic cluster headaches - followed by Dr. Felecia Shelling  9. Thrombocytopenia ([place]) - recent CBC with platelet count 71K, slightly higher than over last two years

## 2020-09-08 NOTE — Patient Instructions (Addendum)
[facility] [address], [place], [place] [address] Phone: [phone]  Dr. Rosezetta Schlatter [facility] Call [phone]

## 2020-09-09 ENCOUNTER — Ambulatory Visit (HOSPITAL_BASED_OUTPATIENT_CLINIC_OR_DEPARTMENT_OTHER): Payer: PRIVATE HEALTH INSURANCE | Admitting: Obstetrics & Gynecology

## 2020-09-09 LAB — CA 125: Cancer Antigen (CA) 125: 25.4 U/mL (ref 0.0–38.1)

## 2020-09-13 ENCOUNTER — Telehealth: Payer: Self-pay | Admitting: Gastroenterology

## 2020-09-13 NOTE — Telephone Encounter (Signed)
Patient wants some other prep called in.  She stated that she cannot afford the RX at $75.  Please call patient to advise,

## 2020-09-14 MED ORDER — GOLYTELY 236 G PO SOLR: 4000.0000 mL | Freq: Once | ORAL | 0 refills | Status: AC

## 2020-09-14 NOTE — Telephone Encounter (Signed)
Sent Golytely to the pharmacy. Gave patient the instructions and she verbalized understanding

## 2020-09-16 ENCOUNTER — Ambulatory Visit: Payer: No Typology Code available for payment source | Admitting: Neurology

## 2020-09-20 ENCOUNTER — Ambulatory Visit (HOSPITAL_BASED_OUTPATIENT_CLINIC_OR_DEPARTMENT_OTHER): Payer: PRIVATE HEALTH INSURANCE | Admitting: Obstetrics & Gynecology

## 2020-09-20 ENCOUNTER — Telehealth (HOSPITAL_BASED_OUTPATIENT_CLINIC_OR_DEPARTMENT_OTHER): Payer: Self-pay | Admitting: Obstetrics & Gynecology

## 2020-09-20 ENCOUNTER — Telehealth: Payer: Self-pay | Admitting: Gastroenterology

## 2020-09-20 NOTE — Telephone Encounter (Signed)
Patient would like to move procedure  10/25/2020. Called Trish and got patient move

## 2020-09-20 NOTE — Telephone Encounter (Signed)
Patient called to said that the vulvar lesion went away and she no longer needed the appointment  .

## 2020-09-20 NOTE — Telephone Encounter (Signed)
[name] needs to r/s her procedure w/ Vanga on 09/22/20 due to personal reasons.

## 2020-09-24 ENCOUNTER — Other Ambulatory Visit (HOSPITAL_BASED_OUTPATIENT_CLINIC_OR_DEPARTMENT_OTHER): Payer: Self-pay | Admitting: Obstetrics & Gynecology

## 2020-09-24 DIAGNOSIS — F324 Major depressive disorder, single episode, in partial remission: Secondary | ICD-10-CM

## 2020-09-24 DIAGNOSIS — F39 Unspecified mood [affective] disorder: Secondary | ICD-10-CM

## 2020-09-24 NOTE — Progress Notes (Signed)
Behavioral health referral placed

## 2020-09-30 ENCOUNTER — Telehealth: Payer: Self-pay | Admitting: *Deleted

## 2020-09-30 ENCOUNTER — Ambulatory Visit
Admission: RE | Admit: 2020-09-30 | Discharge: 2020-09-30 | Disposition: A | Discharge status: Home / Self Care | Payer: PRIVATE HEALTH INSURANCE | Source: Ambulatory Visit | Attending: Physician Assistant | Admitting: Physician Assistant

## 2020-09-30 DIAGNOSIS — R748 Abnormal levels of other serum enzymes: Secondary | ICD-10-CM

## 2020-09-30 DIAGNOSIS — R899 Unspecified abnormal finding in specimens from other organs, systems and tissues: Secondary | ICD-10-CM

## 2020-09-30 IMAGING — US US ABDOMEN LIMITED
1 series · 14 of 25 positions shown · non-contrast
Comparison: CT [DATE]

CLINICAL DATA: Elevated LFTs.

EXAM:
ULTRASOUND ABDOMEN LIMITED RIGHT UPPER QUADRANT

[Series 1: us abdomen limited · 0.23mm/px · 14 of 50 slices shown]
[im 1/50]
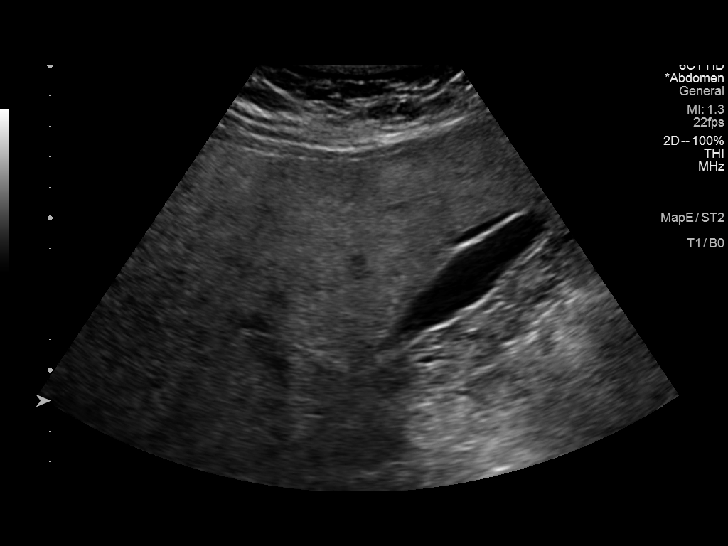
[im 5/50]
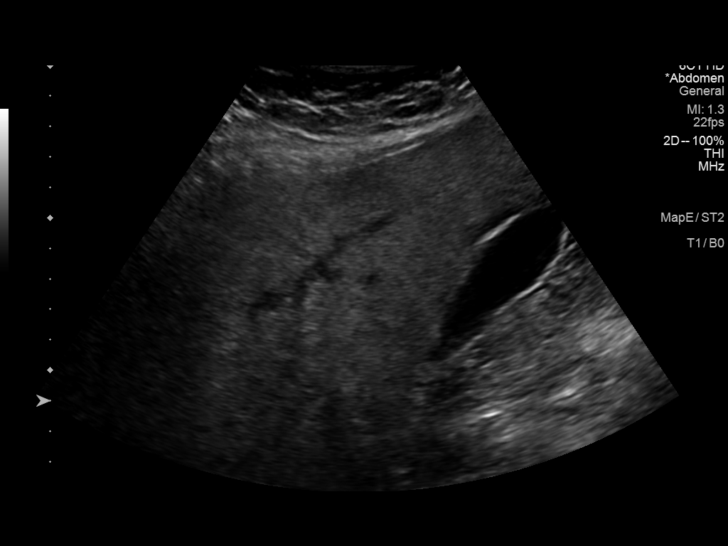
[im 9/50]
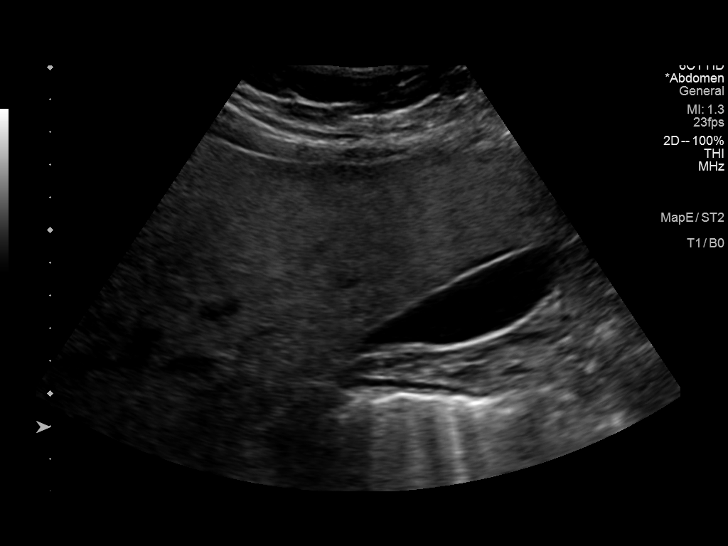
[im 13/50]
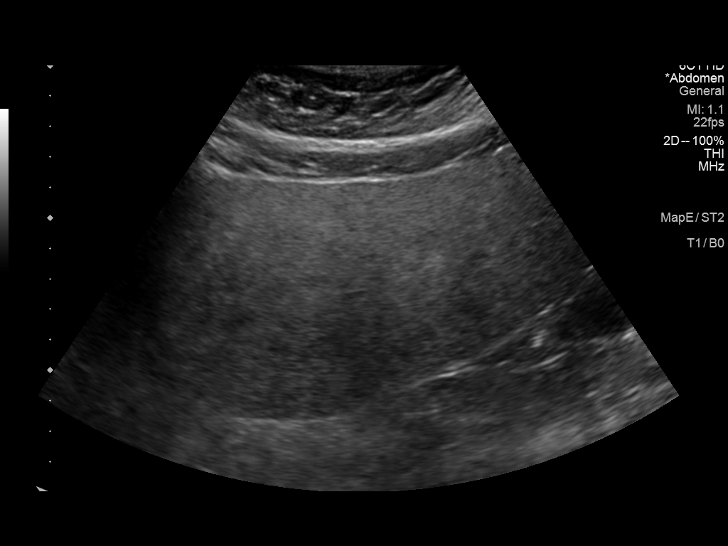
[im 17/50]
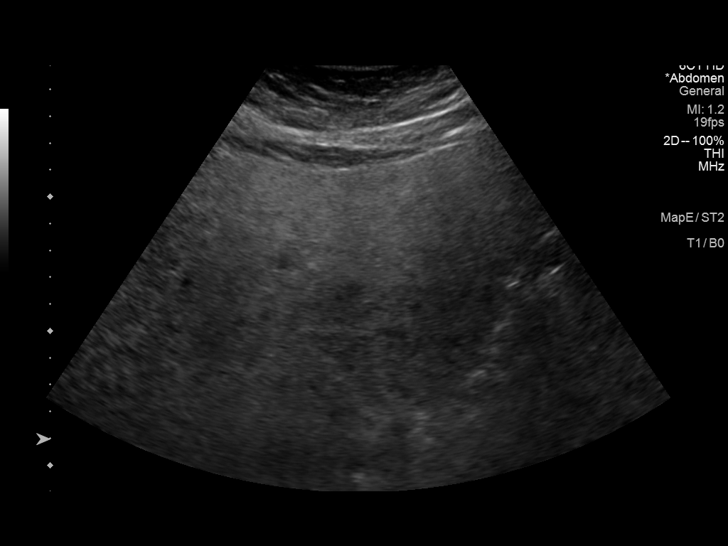
[im 19/50]
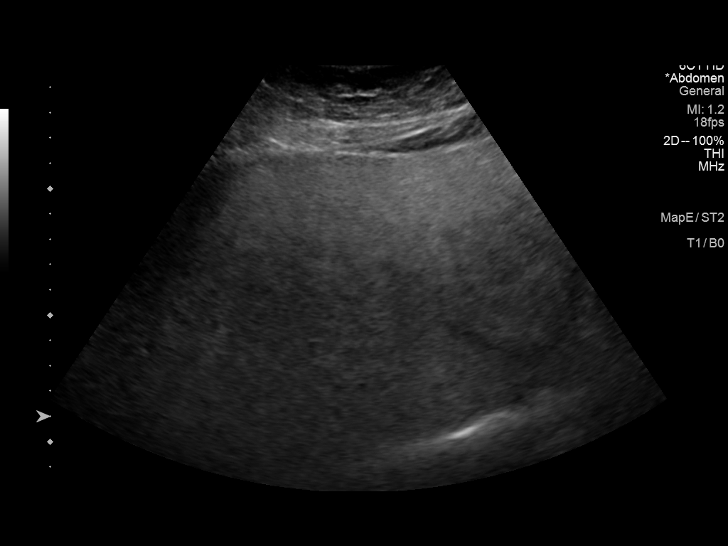
[im 23/50]
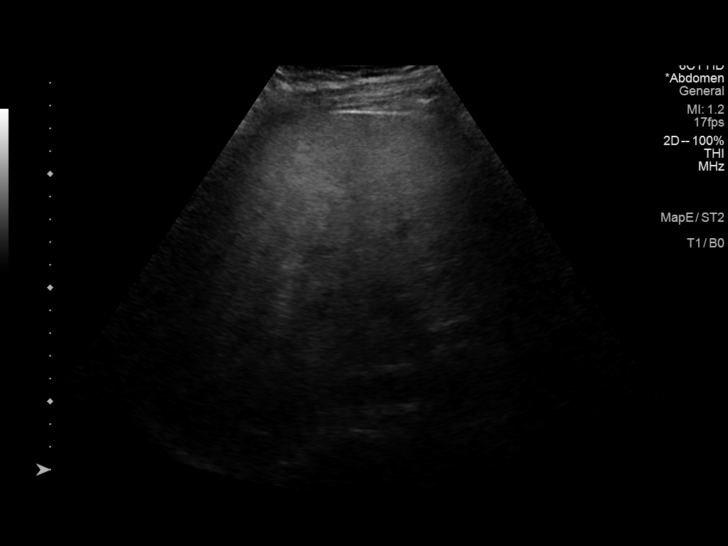
[im 27/50]
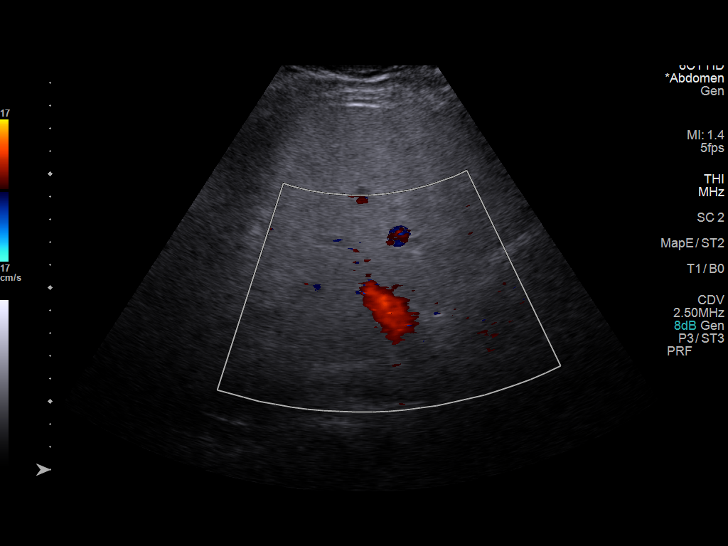
[im 31/50]
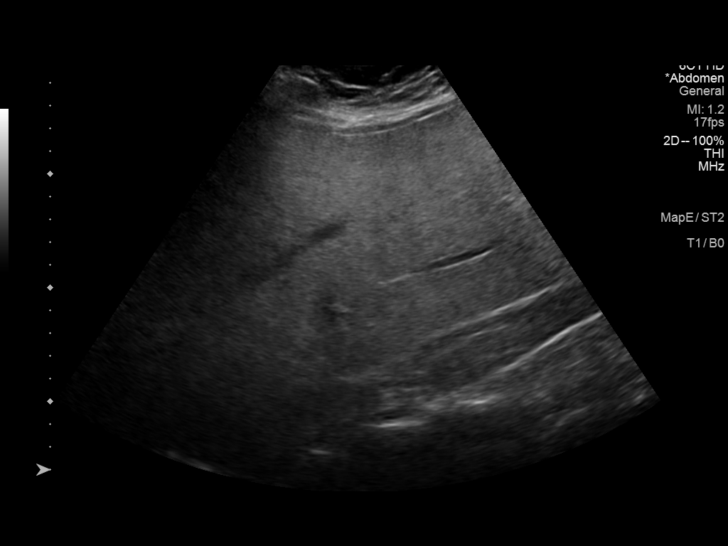
[im 33/50]
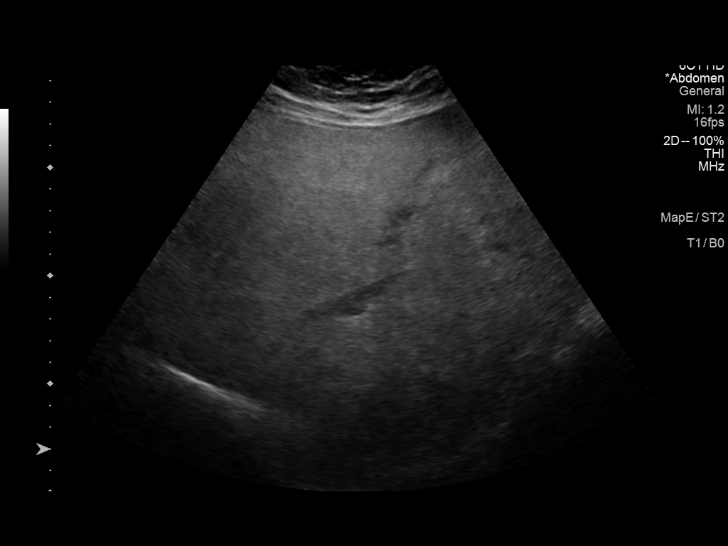
[im 37/50]
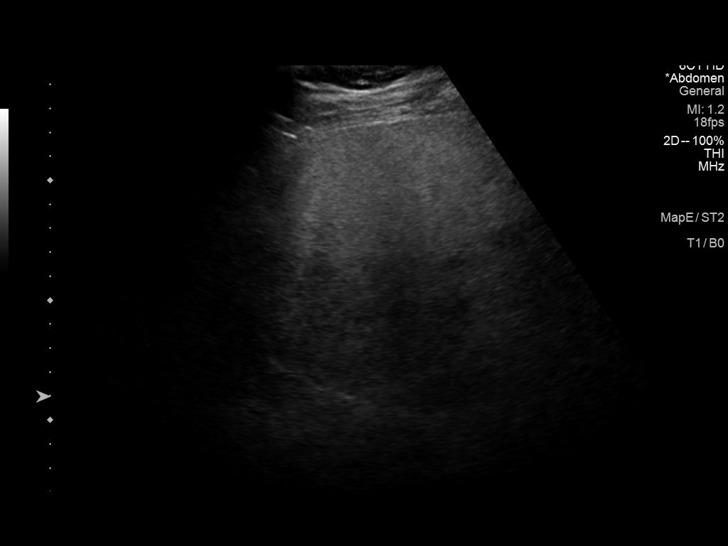
[im 41/50]
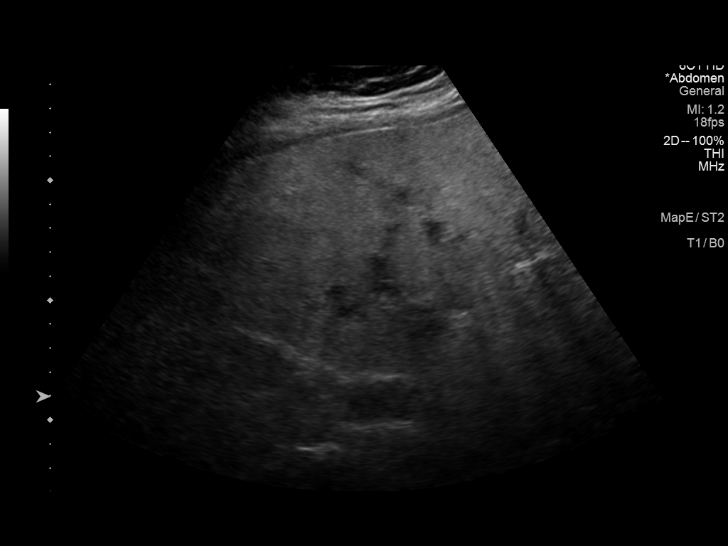
[im 45/50]
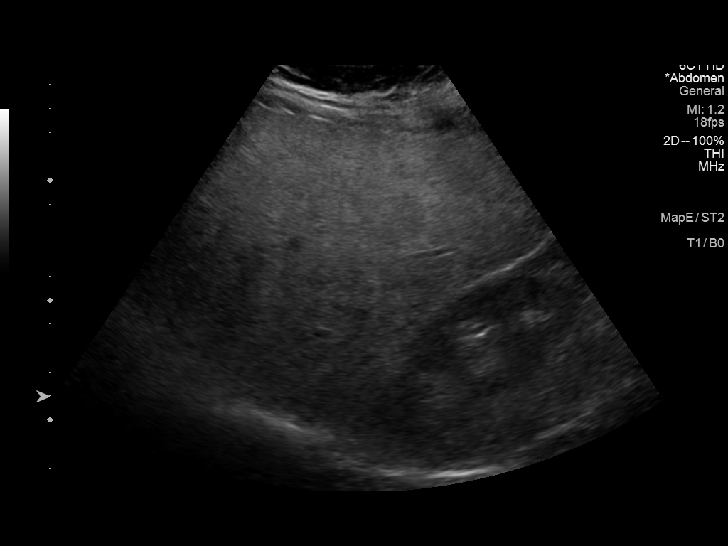
[im 50/50]
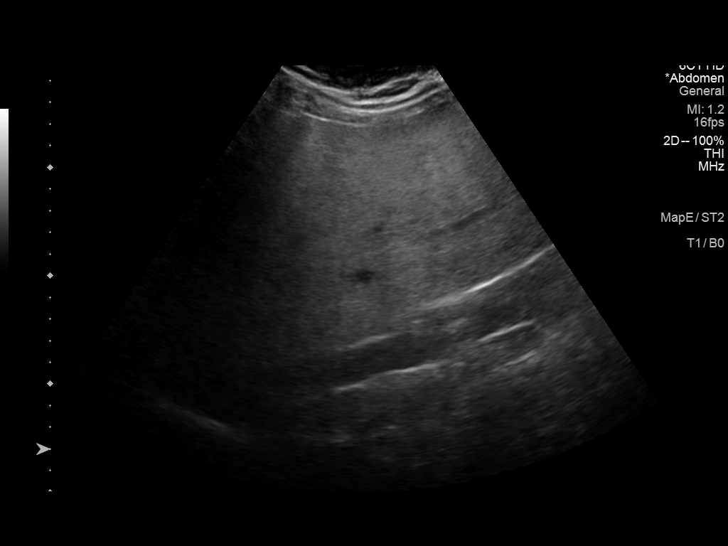

[14 of 25 positions shown; findings below may reference images not displayed]

FINDINGS: Gallbladder:

No gallstones or wall thickening visualized. No sonographic Murphy
sign noted by sonographer.

Common bile duct:

Diameter: 4 mm

Liver:

No focal lesion identified. Coarsened hepatic echotexture with
increased parenchymal echogenicity. Portal vein is patent on color
Doppler imaging with normal direction of blood flow towards the
liver.

Other: None.
IMPRESSION: 1. Coarsened hepatic echotexture with increased parenchymal
echogenicity. This is a nonspecific finding but is most commonly
seen with fatty infiltration of the liver. There are no obvious
focal liver lesions.

## 2020-09-30 NOTE — Telephone Encounter (Signed)
-----   Message from Megan Salon, MD sent at 09/30/2020  1:23 PM EDT ----- Regarding: follow up Can you let this pt know that I did speak to gyn oncology about her and Dr. Berline Lopes thought it was fine for me to keep following her but would recommend every 6 month follow ups.  So, pt needs a 6 month follow up scheduled.  Thanks.  Hx of microinvasive ovarian cancer.    Vinnie Level

## 2020-09-30 NOTE — Telephone Encounter (Signed)
DPR reviewed. LMOVM that Dr. Sabra Heck spoke with gyn oncology and Dr. Berline Lopes thought that it was fine for you to be followed by Dr. Sabra Heck. Advised that follow up would be indicated every 6 months. Advised pt to call office and schedule a 6 month follow up appt.

## 2020-10-01 ENCOUNTER — Telehealth (HOSPITAL_BASED_OUTPATIENT_CLINIC_OR_DEPARTMENT_OTHER): Payer: Self-pay | Admitting: *Deleted

## 2020-10-01 NOTE — Telephone Encounter (Signed)
DOB verified. Informed pt that Dr. Sabra Heck had spoken with gyn oncology about her and Dr. Berline Lopes thought it would be ok for her to continue follow up with Dr. Sabra Heck. Advised that every 6 month follow up was recommended. Pt verbalized understanding. Appt given

## 2020-10-01 NOTE — Telephone Encounter (Signed)
-----   Message from Megan Salon, MD sent at 09/30/2020  1:23 PM EDT ----- Regarding: follow up Can you let this pt know that I did speak to gyn oncology about her and Dr. Berline Lopes thought it was fine for me to keep following her but would recommend every 6 month follow ups.  So, pt needs a 6 month follow up scheduled.  Thanks.  Hx of microinvasive ovarian cancer.    Vinnie Level

## 2020-10-06 ENCOUNTER — Telehealth (HOSPITAL_BASED_OUTPATIENT_CLINIC_OR_DEPARTMENT_OTHER): Payer: Self-pay | Admitting: Family Medicine

## 2020-10-06 DIAGNOSIS — F419 Anxiety disorder, unspecified: Secondary | ICD-10-CM

## 2020-10-06 MED ORDER — CLONAZEPAM 1 MG PO TABS: 1.0000 mg | ORAL_TABLET | Freq: Two times a day (BID) | ORAL | 0 refills | Status: DC | PRN

## 2020-10-06 NOTE — Telephone Encounter (Addendum)
Patient called and want to know if Maudie Mercury the nurse could please back she be waiting by her phone.

## 2020-10-06 NOTE — Telephone Encounter (Signed)
Pt called stating that she tried to get a refill on her klonopin from behavioral health but since she has not yet become an established pt, they can not send the refill. Pt is requesting Dr. Sabra Heck to refill the medication for one month until she can get in to see them. Advised that I would send her request for a refill to the provider and she can check with the pharmacy in the next 24 hours. Pt verbalized understanding.

## 2020-10-19 ENCOUNTER — Telehealth: Payer: Self-pay | Admitting: Neurology

## 2020-10-19 NOTE — Telephone Encounter (Signed)
R/s 03/17/2021 first available

## 2020-10-19 NOTE — Telephone Encounter (Signed)
I cx appt for 10/25/20. You are correct, she was seen earlier in June and Dr. Felecia Shelling wanted her to follow up on 6 months. Please schedule next follow up around 02/15/21. Thank you!

## 2020-10-19 NOTE — Telephone Encounter (Signed)
Ladies this patient is scheduled for Dr. Felecia Shelling 8/22. I believe that he worked her in to come earlier in June so this appt needs to be cancelled. Patient is currently getting infusions and wants to atleast schedule an appt 6 months from June. Is that something I can do or is there a specific timeframe Dr. Felecia Shelling would like the next appt scheduled due to treatment? Also see she has sleep study tomorrow not infusion so please advise and I will call patient. Thank you

## 2020-10-20 ENCOUNTER — Ambulatory Visit (INDEPENDENT_AMBULATORY_CARE_PROVIDER_SITE_OTHER): Payer: 59 | Admitting: Neurology

## 2020-10-20 ENCOUNTER — Other Ambulatory Visit: Payer: Self-pay

## 2020-10-20 DIAGNOSIS — G4734 Idiopathic sleep related nonobstructive alveolar hypoventilation: Secondary | ICD-10-CM

## 2020-10-20 DIAGNOSIS — G4733 Obstructive sleep apnea (adult) (pediatric): Secondary | ICD-10-CM | POA: Diagnosis not present

## 2020-10-21 ENCOUNTER — Encounter: Payer: Self-pay | Admitting: Physician Assistant

## 2020-10-21 ENCOUNTER — Ambulatory Visit (INDEPENDENT_AMBULATORY_CARE_PROVIDER_SITE_OTHER): Payer: 59 | Admitting: Physician Assistant

## 2020-10-21 VITALS — BP 131/77 | HR 75 | Temp 97.8°F | Ht 59.75 in | Wt 186.2 lb

## 2020-10-21 DIAGNOSIS — F411 Generalized anxiety disorder: Secondary | ICD-10-CM

## 2020-10-21 DIAGNOSIS — E119 Type 2 diabetes mellitus without complications: Secondary | ICD-10-CM | POA: Diagnosis not present

## 2020-10-21 DIAGNOSIS — E785 Hyperlipidemia, unspecified: Secondary | ICD-10-CM | POA: Diagnosis not present

## 2020-10-21 DIAGNOSIS — Z566 Other physical and mental strain related to work: Secondary | ICD-10-CM

## 2020-10-21 DIAGNOSIS — F324 Major depressive disorder, single episode, in partial remission: Secondary | ICD-10-CM

## 2020-10-21 DIAGNOSIS — I776 Arteritis, unspecified: Secondary | ICD-10-CM

## 2020-10-21 LAB — POCT GLYCOSYLATED HEMOGLOBIN (HGB A1C): Hemoglobin A1C: 6.7 % — AB (ref 4.0–5.6)

## 2020-10-21 MED ORDER — PREDNISONE 2.5 MG PO TABS: ORAL_TABLET | ORAL | 0 refills | Status: DC

## 2020-10-21 MED ORDER — METFORMIN HCL 500 MG PO TABS: 500.0000 mg | ORAL_TABLET | Freq: Two times a day (BID) | ORAL | 2 refills | Status: DC

## 2020-10-21 NOTE — Patient Instructions (Signed)

## 2020-10-21 NOTE — Progress Notes (Signed)
Established Patient Office Visit  Subjective:  Patient ID: Angel Rodriguez, female    DOB: 08-16-1975  Age: 45 y.o. MRN: [id number]  CC:  Chief Complaint  Patient presents with   Diabetes   Hyperlipidemia    HPI Angel Rodriguez presents for follow up on diabetes mellitus and hyperlipidemia.  During visit patient expressed at great length her stress related to work and medical problems.  Patient states she is constantly blamed for errors or mistakes that occur at her job and does not feel appreciated.  Does not know what her work rights are and is worried she is going to get fired because she frequently has to leave work because of doctors appointments. States her job is asking for a medical letter she can miss work so many times during the week. States how the steroid really affected her body, memory and cognition.  Diabetes: Pt denies increased urination or thirst. Pt reports taking 500 mg 1 tab twice daily instead of 2 tabs BID because of diarrhea. No hypoglycemic events. Checking glucose at home. Has reduced sodas. Working on weight loss.   HLD: Pt states has reduced cheese and drinking more water.  Mood: Patient is followed by Psychiatry. Reports stress with work and her medical issues.   Past Medical History:  Diagnosis Date   Anxiety    Blind right eye 2009   swelling & pressure   Cluster headaches    started after seizure and migraines   Complication of anesthesia    trouble with short term memory after surgeries   COVID-19    Depression    Fibroids    s/p TLH, bilateral salpingectomy   GERD (gastroesophageal reflux disease)    patient thinks due to medications   History of anemia    prior to hysterectomy   History of pneumonia    History of trichomoniasis    Seizures ([place]) x 1 2-3 yrs ago none since   once saw neurologist, full body brain MRI and another 6 months lster   Status post right oophorectomy    Thrombocytopenia ([place])    sees [place] with Heme Onc    Past  Surgical History:  Procedure Laterality Date   CYSTOSCOPY N/A 07/31/2017   Procedure: CYSTOSCOPY;  Surgeon: Megan Salon, MD;  Location: [facility];  Service: Gynecology;  Laterality: N/A;   ENDOMETRIAL ABLATION     EYE SURGERY     laser - removed blood from right eye   HYSTEROSCOPY WITH D & C     LAPAROSCOPY N/A 09/19/2017   Procedure: LAPAROSCOPY DIAGNOSTIC WITH RIGHT OOPHERECTOMY, LYSIS OF ADHESIONS;  Surgeon: Salvadore Dom, MD;  Location: [facility];  Service: Gynecology;  Laterality: N/A;   OMENTECTOMY N/A 11/21/2017   Procedure: OMENTECTOMY AND PELVIC WASHINGS;  Surgeon: Isabel Caprice, MD;  Location: [facility];  Service: Gynecology;  Laterality: N/A;   ROBOTIC ASSISTED SALPINGO OOPHERECTOMY Left 11/21/2017   Procedure: XI ROBOTIC ASSISTED LEFT SALPINGO OOPHORECTOMY;  Surgeon: Isabel Caprice, MD;  Location: [facility];  Service: Gynecology;  Laterality: Left;   TOTAL LAPAROSCOPIC HYSTERECTOMY WITH SALPINGECTOMY Bilateral 07/31/2017   Procedure: TOTAL LAPAROSCOPIC HYSTERECTOMY WITH SALPINGECTOMY;  Surgeon: Megan Salon, MD;  Location: [facility];  Service: Gynecology;  Laterality: Bilateral;  20 week size uterus/ Alexis bag in room/ need 4.5 hours   TUBAL LIGATION     WISDOM TOOTH EXTRACTION      Family History  Problem Relation Age of Onset   Alcoholism Father  Cirrhosis Father    Alcohol abuse Father    Cancer Maternal Grandmother        unsure of type; died of other causes   Heart disease Maternal Aunt    Drug abuse Cousin     Social History   Socioeconomic History   Marital status: Single    Spouse name: Not on file   Number of children: Not on file   Years of education: Not on file   Highest education level: Not on file  Occupational History   Not on file  Tobacco Use   Smoking status: Former    Years: 20.00    Types: Cigarettes, E-cigarettes    Quit date: 05/05/2018    Years since quitting: 2.4   Smokeless tobacco: Never  Vaping Use   Vaping Use: Former  Substance  and Sexual Activity   Alcohol use: Not Currently   Drug use: No   Sexual activity: Yes    Birth control/protection: Surgical    Comment: hysterectomy  Other Topics Concern   Not on file  Social History Narrative   Not on file   Social Determinants of Health   Financial Resource Strain: Not on file  Food Insecurity: Not on file  Transportation Needs: Not on file  Physical Activity: Not on file  Stress: Not on file  Social Connections: Not on file  Intimate Partner Violence: Not on file    Outpatient Medications Prior to Visit  Medication Sig Dispense Refill   Accu-Chek Softclix Lancets lancets Use as instructed 100 each 12   Acetaminophen-Codeine 300-30 MG tablet ONE TO TWO AS NEEDED FOR HEADACHE. NO MORE THAN 4/DAY 28 tablet 1   Ascorbic Acid (VITAMIN C) 1000 MG tablet Take 1,000 mg by mouth daily.     atropine 1 % ophthalmic solution Place 1 drop into the right eye 3 (three) times daily.      blood glucose meter kit and supplies Dispense based on patient and insurance preference. Use to check fasting blood sugar and 2 hrs after largest meal. (FOR ICD-10 E10.9, E11.9). 1 each 0   brimonidine (ALPHAGAN P) 0.1 % SOLN Place 1 drop into the right eye 3 (three) times daily.      calcium carbonate (TUMS EX) 750 MG chewable tablet Chew 2 tablets by mouth daily as needed for heartburn.      clonazePAM (KLONOPIN) 1 MG tablet Take 1 tablet (1 mg total) by mouth 2 (two) times daily as needed for anxiety. 60 tablet 0   dorzolamide-timolol (COSOPT) 22.3-6.8 MG/ML ophthalmic solution Place 1 drop into the right eye 2 (two) times daily.      fluticasone (FLONASE) 50 MCG/ACT nasal spray Place 1-2 sprays into both nostrils daily as needed for allergies.      glucose blood (ACCU-CHEK GUIDE) test strip Use to check fasting glucose and 2 hours after largest meal 100 each 12   Lacosamide 100 MG TABS Take 1 tablet (100 mg total) by mouth 2 (two) times daily. 180 tablet 1   latanoprost (XALATAN) 0.005 %  ophthalmic solution Place 1 drop into both eyes at bedtime.     metFORMIN (GLUCOPHAGE) 500 MG tablet Take 1 tablet by mouth two times daily with a meal x 1 week. Then take 2 tablets by mouth two times daily with a meal. 120 tablet 2   nystatin cream (MYCOSTATIN) Apply 1 application topically 2 (two) times daily. Apply to affected area BID for up to 7 days. 30 g 1   predniSONE (  DELTASONE) 10 MG tablet Take 1 tablet (10 mg total) by mouth daily with breakfast. 100 tablet 3   SUMAtriptan (IMITREX) 100 MG tablet Take 1 tablet (100 mg total) by mouth once as needed for migraine. May repeat in 2 hours if headache persists or recurs. 18 tablet 3   verapamil (CALAN-SR) 240 MG CR tablet TAKE 1 TABLET BY MOUTH AT BEDTIME. 90 tablet 3   Vitamin D, Ergocalciferol, (DRISDOL) 1.25 MG (50000 UNIT) CAPS capsule Take 1 capsule (50,000 Units total) by mouth every 7 (seven) days. 12 capsule 0   No facility-administered medications prior to visit.    Allergies  Allergen Reactions   Ivp Dye [Iodinated Diagnostic Agents] Hives, Itching and Swelling   Bee Venom Swelling    ROS Review of Systems Review of Systems:  A fourteen system review of systems was performed and found to be positive as per HPI.  Objective:    Physical Exam General:  Well Developed, well nourished, appropriate for stated age.  Neuro:  Alert and oriented HEENT:  Normocephalic, atraumatic, neck supple Skin:  no gross rash, warm, pink. Cardiac:  RRR, S1 S2 Respiratory:  CTA B/L, Not using accessory muscles, speaking in full sentences- unlabored. Vascular:  Ext warm, no cyanosis apprec.; cap RF less 2 sec. Psych:  Judgement and insight good, tearful mood. Full Affect.  BP 131/77   Pulse 75   Temp 97.8 F (36.6 C)   Ht 4' 11.75" (1.518 m)   Wt 186 lb 3.2 oz (84.5 kg)   LMP 05/02/2017   SpO2 97%   BMI 36.67 kg/m  Wt Readings from Last 3 Encounters:  10/21/20 186 lb 3.2 oz (84.5 kg)  09/08/20 191 lb (86.6 kg)  08/16/20 197 lb 8  oz (89.6 kg)     Health Maintenance Due  Topic Date Due   PNEUMOCOCCAL POLYSACCHARIDE VACCINE AGE 46-64 HIGH RISK  Never done   COVID-19 Vaccine (1) Never done   Pneumococcal Vaccine 33-13 Years old (1 - PCV) Never done   FOOT EXAM  Never done   OPHTHALMOLOGY EXAM  Never done   URINE MICROALBUMIN  Never done   COLONOSCOPY (Pts 45-62yrs Insurance coverage will need to be confirmed)  Never done   INFLUENZA VACCINE  10/04/2020    There are no preventive care reminders to display for this patient.  Lab Results  Component Value Date   TSH 1.730 07/09/2020   Lab Results  Component Value Date   WBC 6.4 08/16/2020   HGB 13.9 08/16/2020   HCT 42.0 08/16/2020   MCV 90 08/16/2020   PLT 71 (LL) 08/16/2020   Lab Results  Component Value Date   NA 141 09/02/2020   K 3.9 09/02/2020   CHLORIDE 104 08/31/2016   CO2 21 09/02/2020   GLUCOSE 123 (H) 09/02/2020   BUN 9 09/02/2020   CREATININE 0.71 09/02/2020   BILITOT 0.4 09/02/2020   ALKPHOS 59 09/02/2020   AST 49 (H) 09/02/2020   ALT 95 (H) 09/02/2020   PROT 7.4 09/02/2020   ALBUMIN 5.0 (H) 09/02/2020   CALCIUM 9.9 09/02/2020   ANIONGAP 10 07/10/2018   EGFR 106 09/02/2020   Lab Results  Component Value Date   CHOL 368 (H) 07/09/2020   Lab Results  Component Value Date   HDL 48 07/09/2020   Lab Results  Component Value Date   LDLCALC 193 (H) 07/09/2020   Lab Results  Component Value Date   TRIG 596 ([place]) 07/09/2020   Lab Results  Component  Value Date   CHOLHDL 7.7 (H) 07/09/2020   Lab Results  Component Value Date   HGBA1C 6.7 (A) 10/21/2020      Assessment & Plan:   Problem List Items Addressed This Visit       Other   Major depressive disorder, single episode, in partial remission ([place])   GAD (generalized anxiety disorder)   Other Visit Diagnoses     Diabetes mellitus, new onset ([place])    -  Primary   Relevant Orders   POCT glycosylated hemoglobin (Hb A1C) (Completed)   Ambulatory referral to  diabetic education   Hyperlipidemia, unspecified hyperlipidemia type       Stress at work          Diabetes mellitus, new onset: -A1c has improved from 9.2 to 6.7. Patient did not contact the office regarding Metformin side effects and constantly interrupted conversation during visit so will further discuss trial of Metformin XR at follow up visit. Continue Metformin 500 mg once tab BID. -Recommend to continue weight loss efforts and reduction of carbohydrates and glucose. -Will continue to monitor.  GAD, MDD: -PHQ-9 score of 22 (patient's baseline), GAD-7 score of 19 (patient's baseline). -Recommend to continue to follow up with Psychiatry.  -Continue current medication regimen.  Hyperlipidemia: -Last lipid panel: total cholesterol 368, triglycerides 596, HDL 46, LDL 193. Patient not an ideal candidate for statin therapy due to elevated liver enzymes (ALT 95). Recommend to continue weight loss efforts, reduce saturated/trans fats and simple carbohydrates. -Will repeat lipid panel at follow up visit.  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 Will provide work note for today.  No orders of the defined types were placed in this encounter.   Follow-up: Return in about 4 months (around 02/20/2021) for DM, HTN, HLD with FBW few days before.    Lorrene Reid, PA-C

## 2020-10-22 ENCOUNTER — Encounter: Payer: Self-pay | Admitting: Gastroenterology

## 2020-10-25 ENCOUNTER — Encounter: Payer: Self-pay | Admitting: Registered Nurse

## 2020-10-25 ENCOUNTER — Encounter: Admission: RE | Payer: Self-pay | Source: Home / Self Care

## 2020-10-25 ENCOUNTER — Ambulatory Visit
Admission: RE | Admit: 2020-10-25 | Payer: PRIVATE HEALTH INSURANCE | Source: Home / Self Care | Admitting: Gastroenterology

## 2020-10-25 ENCOUNTER — Ambulatory Visit: Payer: 59 | Admitting: Neurology

## 2020-10-25 SURGERY — COLONOSCOPY WITH PROPOFOL
Anesthesia: General

## 2020-10-27 DIAGNOSIS — G4734 Idiopathic sleep related nonobstructive alveolar hypoventilation: Secondary | ICD-10-CM | POA: Insufficient documentation

## 2020-10-27 NOTE — Progress Notes (Signed)
PATIENT'S NAME:  Angel Rodriguez, Angel Rodriguez DOB:      1975-03-31      MR#:    [id number]     DATE OF RECORDING: 10/20/2020 REFERRING M.D.:  Lorrene Reid, PA-C Study Performed:   Baseline Polysomnogram HISTORY:   She snores and has excessive daytime sleepiness.   No recent seizure.    The patient endorsed the Epworth Sleepiness Scale at 16 points.    The patient's weight 197 pounds with a height of 60 (inches), resulting in a BMI of 38.5 kg/m2.  The patient's neck circumference measured  inches.  CURRENT MEDICATIONS: Acetaminophen-Codeine, Vitamin C, Atropine, Alphagan, Tums EX, Klonopin, Cosopt, Flonase, Lacosamide, Xalatan, Glucophage, Mycostatine, Deltasone, Imitrex, Calan-SR, Drisdol, Trintellix   PROCEDURE:  This is a multichannel digital polysomnogram utilizing the Somnostar 11.2 system.  Electrodes and sensors were applied and monitored per AASM Specifications.   EEG, EOG, Chin and Limb EMG, were sampled at 200 Hz.  ECG, Snore and Nasal Pressure, Thermal Airflow, Respiratory Effort, CPAP Flow and Pressure, Oximetry was sampled at 50 Hz. Digital video and audio were recorded.      BASELINE STUDY  Lights Out was at 22:19 and Lights On at 04:59.  Total recording time (TRT) was 400.5 minutes, with a total sleep time (TST) of 236 minutes.   The patient's sleep latency was 144 minutes.  REM latency was 0 minutes.  The sleep efficiency was 58.9 %.     SLEEP ARCHITECTURE: WASO (Wake after sleep onset) was 8 minutes.  There were 13 minutes in Stage N1, 198 minutes Stage N2, 25 minutes Stage N3 and 0 minutes in Stage REM.  The percentage of Stage N1 was 5.5%, Stage N2 was 83.9%, Stage N3 was 10.6% and Stage R (REM sleep) was 0%.  The arousals were noted as: 9 were spontaneous, 0 were associated with PLMs, 0 were associated with respiratory events.   RESPIRATORY ANALYSIS:  There were a total of 2 respiratory events:  0 obstructive apneas, 0 central apneas and 0 mixed apneas with a total of 0 apneas and an  apnea index (AI) of 0 /hour. There were 2 hypopneas with a hypopnea index of .5 /hour. The patient also had 0 respiratory event related arousals (RERAs).      The total APNEA/HYPOPNEA INDEX (AHI) was .5/hour and the total RESPIRATORY DISTURBANCE INDEX was  .5 /hour.  0 events occurred in REM sleep and 4 events in NREM. The REM AHI was  0 /hour, versus a non-REM AHI of .5. The patient spent 220 minutes of total sleep time in the supine position and 16 minutes in non-supine.. The supine AHI was 0.5 versus a non-supine AHI of 0.0.  OXYGEN SATURATION & C02:  The Wake baseline 02 saturation was 90%, with the lowest being 85%. Time spent below 89% saturation equaled 4 minutes.  PERIODIC LIMB MOVEMENTS:   The patient had a total of 0 Periodic Limb Movements.  The Periodic Limb Movement (PLM) index was 0 and the PLM Arousal index was 0/hour.  OTHER Audio and video analysis did not show any abnormal or unusual movements, behaviors, phonations or vocalizations.    Snoring was noted.    EKG showed normal sinus rhythm with frequent PVC's sometimes in bigeminy and trigeminy     Post-study, the patient indicated that sleep was the same as usual.    IMPRESSION:  No significant OSA was noted 44% of the night was with SaO2 80-89% Frequent PVC's sometimes in bigeminy and trigeminy  RECOMMENDATIONS: Nocturnal oxygen (2L [place]) for hypoxemia Consider cardiology referral for frequent PVC's   I certify that I have reviewed the entire raw data recording prior to the issuance of this report in accordance with the Standards of Accreditation of the American Academy of Sleep Medicine (AASM)    Juanell Fairly, PhD Diplomat, American Board of Psychiatry and Neurology  Diplomat, American Board of Sleep Medicine     Demographics and Medical History           Name: Angel Rodriguez Age: 45 BMI: 38.5 Interp Physician: Arlice Colt, MD  DOB: 1976/01/06 Ht-IN: 60 CM: 152 Referred By: Lorrene Reid, PA-C  Pt.  Tag:  Wt-LB: 197 KG: 89 Tested By: Craven Crean Miu, RPSGT  Pt. #: [id number] Sex: Female Scored By: Kieth Hartis Miu, RPSGT  Bed Tag: Room 1 Race: Other    Sleep Summary    Sleep Time Statistics Minutes Hours    Time in Bed 400.5    6.7    Total Sleep Time 236    3.9    Total Sleep Time NREM 236    3.9    Total Sleep Time REM 0    0.0    Sleep Onset 144    2.4    Wake After Sleep Onset 8    0.1    Wake After Sleep Period 12.5    0.2    Latency Persistent Sleep 144    2.4    Sleep Efficiency 58.9 Percent    Lights out 22:19     Lights on 04:59    Sleep Disruption Events Count Index    Arousals 10 2.5    Awakenings 9 2.3    Arousals + Awakenings 19 4.8    REM Awakenings 0 0.0     Sleep Stage Statistics Wake N1 N2 N3 REM    Percent Stage to SPT 3.3  5.3  81.1  10.2  0.0  Percent   Sleep Period Time in Stage 8 13 198 25 0 Minutes   Latency to Stage  144 145.5 62 0 Minutes   Percent Stage to TST  5.5 83.9 10.6 0 Percent   EKG Summary          EKG Statistics         Heart Rate, Wake 96 BPM  TST Epochs in HR Interval 0 < 29   Heart Rate, Steady Sleep Avg 84 BPM   0 30-59   PAC Events 0 Count   87 60-79   PVC Events 0 Count   385 80-99   Bradycardia 0 Count   0 100-119   Tachycardia 0 Count   0 120-139        0 140-159    NREM REM   0 > 160   Shortest R-R .6 0       Longest R-R .8 0        Respiration Summary  Event Statistics Total  With Arousal  With Awakening    Count Index  Count Index  Count Index   Apneas, Total 0 0  0 0.0   0 0.0    Hypopneas, Total 2 .5  0 0.0   0 0.0    Apnea + Hypopnea Index 2 0.5   0 0.0   0 0.0    Apneas, Supine 0 0     Apneas, Non Supine 0 0     Hypopneas, Supine 2 .5     Hypopneas, Non Supine 0 0     %  Sleep Apnea 0 Percent     % Sleep Hypopnea .2 Percent    Oximetry Statistics       SpO2, Mean Wake 90 Percent     SpO2, Minimum 85 Percent     SpO2, Max 93 Percent     SpO2, Mean 90 Percent            Desaturation Index, REM  0.0  Index     Desaturation Index, NREM 0.5  Index     Desaturation Index, Total 0.5  Index             SpO2 Intervals > 89% 80-89% 70-79% 60-69% 50-59% 40-49% 30-39% < 30%  236 Percent Sleep Time 55.9 44.1 0 0 0 0 0 0  Body Position Statistics   Back Side L Side R Side Prone    Total Sleep Time   220 16.0 0 16 0 Minutes   Percent Time to TST   93.2  6.8  0.0  6.8  0.0  Percent   Number of Events   2 0.0 0 0 0 Count   Number of Apneas   0 0 0 0 0 Count   Number of Hypopneas   2 0 0 0 0 Count   Apnea Index   0.0  0.0  0.0  0.0  0.0  Index   Hypopnea Index   0.5  0.0  0.0  0.0  0.0  Index   Apnea + Hypopnea Index   0.5  0.0  0.0  0.0  0.0  Index  Respiration Events    Non REM, Pre Rx Statistics Non Supine  Supine    Central Mixed Obstr  Central Mixed Obstr   Apneas 0 0 0  0 0 0 Count  Apneas, Minimum SpO2 0 0 0  0 0 0 Percent     Hypopneas 0 0 0  0 0 2 Count  Hypopneas, Minimum SpO2 0 0 0  0 0 90 Percent     Apnea + Hypopneas Index 0.0 0.0 0.0  0.0 0.0 0.5 Index    REM, Pre Rx Statistics Non Supine  Supine    Central Mixed Obstr  Central Mixed Obstr   Apneas 0 0 0  0 0 0 Count  Apneas, Minimum SpO2 0 0 0  0 0 0 Percent     Hypopneas 0 0 0  0 0 0 Count  Hypopneas, Minimum SpO2 0 0 0  0 0 0 Percent     Apnea + Hypopnea Index 0.0 0.0 0.0  0.0 0.0 0.0 Index  Leg Movement Summary    PLM Non REM (Incl. Wake) REM Total    No Arousal Arousal Wake No Arousal Arousal Wake No Arousal Arousal Wake Total   Isolated '0 0 0 0 0 0 0 0 0 0 '$   PLMS '0 0 0 0 0 0 0 0 0 0 '$   Total '0 0 0 0 0 0 0 0 0 0 '$  PLM Statistics PLMS Total     Count Index Count Index    PLM 0 0 0 0.0     PLM with Arousal 0 0 0 0.0     PLM, with Wake 0 0 0 0.0     PLM, Arousal + Wake 0 0.0 0 0.0     PLM, No Arousal 0 0.0  0 0.0     PLM, Non REM 0 0.0  0 0.0     PLM, REM 0 0.0  0 0.0  Technician Comments:  The patient came into the sleep lab for a NPSG. She took Klonopin prior to  bed.  She had no restroom breaks during the night. Her EKG showed PVC"s with a mix of  Bigeminy and Trigeminy. The patient had moderate snoring. She did not have any respiratory events however; she did have O2 sats below 90%. The patient slept lateral and supine. The patient had no REM sleep during the study.

## 2020-10-28 ENCOUNTER — Telehealth: Payer: Self-pay | Admitting: *Deleted

## 2020-10-28 DIAGNOSIS — Z0289 Encounter for other administrative examinations: Secondary | ICD-10-CM

## 2020-10-28 DIAGNOSIS — R0902 Hypoxemia: Secondary | ICD-10-CM

## 2020-10-28 DIAGNOSIS — I493 Ventricular premature depolarization: Secondary | ICD-10-CM

## 2020-10-28 DIAGNOSIS — I498 Other specified cardiac arrhythmias: Secondary | ICD-10-CM

## 2020-10-28 DIAGNOSIS — R008 Other abnormalities of heart beat: Secondary | ICD-10-CM

## 2020-10-28 NOTE — Telephone Encounter (Signed)
Late entry from 12:15pm: Called and spoke with pt about results. Advised I discussed with Dr. Felecia Shelling and he would like to refer her to cardiology and pulmonology for further work up given these findings. She verbalized understanding.  She asked about paperwork for her job. Advised this was completed. She wanted to know what was put on form. I went over this with her. She was tearful, worried about her job and what people may be saying or will say about her. Advised we will help in any way we can. She will call back if anything further is needed.

## 2020-10-28 NOTE — Telephone Encounter (Signed)
-----   Message from Britt Bottom, MD sent at 10/27/2020  6:47 PM EDT ----- Regarding: o2 Her PSG showed nocturnal hypoxemia.  I would like to set up 2L [place] O2  She also had a  lot of premature beats so I would like to refer to cardiology for "Frequent PVCs with runs of bigeminy/trigeminy

## 2020-11-01 ENCOUNTER — Other Ambulatory Visit: Payer: Self-pay | Admitting: Physician Assistant

## 2020-11-01 DIAGNOSIS — E559 Vitamin D deficiency, unspecified: Secondary | ICD-10-CM

## 2020-11-01 MED ORDER — VITAMIN D (ERGOCALCIFEROL) 1.25 MG (50000 UNIT) PO CAPS: 50000.0000 [IU] | ORAL_CAPSULE | ORAL | 0 refills | Status: DC

## 2020-11-02 ENCOUNTER — Other Ambulatory Visit: Payer: Self-pay | Admitting: Physician Assistant

## 2020-11-02 DIAGNOSIS — E119 Type 2 diabetes mellitus without complications: Secondary | ICD-10-CM

## 2020-11-10 ENCOUNTER — Ambulatory Visit (INDEPENDENT_AMBULATORY_CARE_PROVIDER_SITE_OTHER): Payer: 59 | Admitting: Psychology

## 2020-11-10 DIAGNOSIS — F321 Major depressive disorder, single episode, moderate: Secondary | ICD-10-CM

## 2020-11-11 ENCOUNTER — Encounter (HOSPITAL_BASED_OUTPATIENT_CLINIC_OR_DEPARTMENT_OTHER): Payer: Self-pay

## 2020-11-12 ENCOUNTER — Other Ambulatory Visit (HOSPITAL_BASED_OUTPATIENT_CLINIC_OR_DEPARTMENT_OTHER): Payer: Self-pay | Admitting: Obstetrics & Gynecology

## 2020-11-12 DIAGNOSIS — F419 Anxiety disorder, unspecified: Secondary | ICD-10-CM

## 2020-11-12 MED ORDER — CLONAZEPAM 1 MG PO TABS: 1.0000 mg | ORAL_TABLET | Freq: Two times a day (BID) | ORAL | 1 refills | Status: DC | PRN

## 2020-11-12 NOTE — Telephone Encounter (Signed)
DOB verified. Informed pt that refill for klonopin had been sent to her pharmacy. Advised that this was not something that Dr. Sabra Heck could refill long term. Pt states that she is getting set up with a psychiatrist.

## 2020-11-17 ENCOUNTER — Telehealth (HOSPITAL_COMMUNITY): Payer: Self-pay | Admitting: Psychiatry

## 2020-11-17 ENCOUNTER — Ambulatory Visit (INDEPENDENT_AMBULATORY_CARE_PROVIDER_SITE_OTHER): Payer: 59 | Admitting: Psychology

## 2020-11-17 DIAGNOSIS — F321 Major depressive disorder, single episode, moderate: Secondary | ICD-10-CM

## 2020-11-17 NOTE — Telephone Encounter (Signed)
D:  Pt's therapist Hubbard Robinson) from [place] referred pt to [place].  A:  Placed call to orient pt and get her scheduled for an assessment, but there was no answer.  Left vm for pt to call the case manager back.  Informed [place] (sp?).

## 2020-11-18 ENCOUNTER — Telehealth (HOSPITAL_COMMUNITY): Payer: Self-pay | Admitting: Psychiatry

## 2020-11-18 NOTE — Telephone Encounter (Signed)
D:  Pt returned case manager's call re: MH-IOP.  A:  Oriented pt.  Encouraged pt to verify her benefits.  CCA is scheduled for 11-22-20 @ 10 a.m; pt will start MH-IOP on 11-23-20 @       9 a.m.  Let therapist Hubbard Robinson) know.  R:  Pt receptive.

## 2020-11-22 ENCOUNTER — Other Ambulatory Visit: Payer: Self-pay

## 2020-11-22 ENCOUNTER — Other Ambulatory Visit (HOSPITAL_COMMUNITY): Payer: 59 | Attending: Psychiatry | Admitting: Psychiatry

## 2020-11-22 DIAGNOSIS — F411 Generalized anxiety disorder: Secondary | ICD-10-CM | POA: Insufficient documentation

## 2020-11-22 DIAGNOSIS — Z79899 Other long term (current) drug therapy: Secondary | ICD-10-CM | POA: Insufficient documentation

## 2020-11-22 DIAGNOSIS — F332 Major depressive disorder, recurrent severe without psychotic features: Secondary | ICD-10-CM | POA: Insufficient documentation
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
## 2020-11-23 ENCOUNTER — Other Ambulatory Visit (HOSPITAL_COMMUNITY): Payer: 59 | Admitting: Professional

## 2020-11-23 ENCOUNTER — Encounter (HOSPITAL_COMMUNITY): Payer: Self-pay | Admitting: Psychiatry

## 2020-11-23 ENCOUNTER — Other Ambulatory Visit: Payer: Self-pay

## 2020-11-23 DIAGNOSIS — Z79899 Other long term (current) drug therapy: Secondary | ICD-10-CM | POA: Diagnosis not present

## 2020-11-23 DIAGNOSIS — F332 Major depressive disorder, recurrent severe without psychotic features: Secondary | ICD-10-CM | POA: Insufficient documentation

## 2020-11-23 DIAGNOSIS — F411 Generalized anxiety disorder: Secondary | ICD-10-CM

## 2020-11-23 NOTE — Progress Notes (Addendum)
Virtual Visit via Video Note   I connected with Angel Rodriguez on 11/23/20 at  9:00 AM EDT by a video enabled telemedicine application and verified that I am speaking with the correct person using two identifiers.   At orientation to the IOP program, Case Manager discussed the limitations of evaluation and management by telemedicine and the availability of in person appointments. The patient expressed understanding and agreed to proceed with virtual visits throughout the duration of the program.   Location:  Patient: Patient Home Provider: Clinical Home Office   History of Present Illness: MDD, PTSD, and GAD   Observations/Objective: Check In: Case Manager checked in with all participants to review discharge dates, insurance authorizations, work-related documents and needs from the treatment team regarding medications. Client stated needs and engaged in discussion.   Initial Therapeutic Activity: Counselor introduced [employer], [place] Chaplain to present information and discussion on Grief and Loss. Group members engaged in discussion, sharing how grief impacts them, what comforts them, what emotions are felt, labeling losses, etc. Client noted grief connected with relationship changes with mother and physical health.   Second Therapeutic Activity: Counselor facilitated a check-in with group members to assess mood and current functioning. Client shared details of their mental health management, including challenges and successes. Client arrived within time allowed and reports that she is feeling "really low." Client rates her mood at a 1 on a scale of 1-10 with 10 being great. Counselor engaged group in discussion, covering the following topics: applying coping skills, identifying and celebrating growth, evaluating self in multiple facets of life, and HALT. Client presents with severe depression and severe anxiety. Client denied any current SI/HI. Client reports sleep and appetite are  decreased.    Third Therapeutic Activity: Counselor introduced [employer], [place] Chaplain to present information and discussion on [place]. Group members engaged in activity. Client shared hope related to having a better day today than yesterday and encouraged by seeing other group members progress.       Check Out: Counselor closed program and prompted group members to identify one self-care practice or productivity activity they would like to engage in today. Client endorsed safety plan to be followed to prevent safety issues.   Assessment and Plan: Clinician recommends that Client remain in IOP treatment to better manage mental health symptoms, stabilization and to address treatment plan goals. Clinician recommends adherence to crisis/safety plan, taking medications as prescribed, and following up with medical professionals if any issues arise.     Follow Up Instructions: Clinician will send Webex link for next session. The Client was advised to call back or seek an in-person evaluation if the symptoms worsen or if the condition fails to improve as anticipated.     I provided 180 minutes of non-face-to-face time during this encounter.

## 2020-11-24 ENCOUNTER — Other Ambulatory Visit: Payer: Self-pay

## 2020-11-24 ENCOUNTER — Other Ambulatory Visit (HOSPITAL_COMMUNITY): Payer: 59 | Admitting: Family

## 2020-11-24 DIAGNOSIS — F332 Major depressive disorder, recurrent severe without psychotic features: Secondary | ICD-10-CM

## 2020-11-24 DIAGNOSIS — F411 Generalized anxiety disorder: Secondary | ICD-10-CM
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
## 2020-11-25 ENCOUNTER — Other Ambulatory Visit: Payer: Self-pay

## 2020-11-25 ENCOUNTER — Other Ambulatory Visit (HOSPITAL_COMMUNITY): Payer: 59

## 2020-11-25 NOTE — Progress Notes (Signed)
Virtual Visit via Video Note   I connected with Angel Rodriguez on 11/24/20 at  9:00 AM EDT by a video enabled telemedicine application and verified that I am speaking with the correct person using two identifiers.   At orientation to the IOP program, Case Manager discussed the limitations of evaluation and management by telemedicine and the availability of in person appointments. The patient expressed understanding and agreed to proceed with virtual visits throughout the duration of the program.   Location:  Patient: Patient Home Provider: Clinical Home Office   History of Present Illness: MDD, PTSD, and GAD   Observations/Objective: Check In: Case Manager checked in with all participants to review discharge dates, insurance authorizations, work-related documents and needs from the treatment team regarding medications. Client stated needs and engaged in discussion.   Initial Therapeutic Activity: Counselor introduced our guest speaker, [name], [facility] Pharmacist, who shared about psychiatric medications, side effects, treatment considerations and how to communicate with medical professionals. Group Members asked questions and shared medication concerns.   Second Therapeutic Activity: Counselor facilitated a check-in with group members to assess mood and current functioning. Client shared details of their mental health management since our last session, including challenges and successes. Client arrived within time allowed and reports that she is feeling "OK." Client rates her mood at a 5 on a scale of 1-10 with 10 being great. Client presents with severe depression and severe anxiety. Client denied any current SI/HI. Client reports sleep is "non-existent" and appetite is "better."    Third Therapeutic Activity: Counselor introduced Frederich Balding, [place] [facility] to present information and discussion on exercise and sleep. Group members engaged in activity.      Check Out:  Counselor closed program and prompted group members to identify one self-care practice or productivity activity they would like to engage in today. Client endorsed safety plan to be followed to prevent safety issues.   Assessment and Plan: Clinician recommends that Client remain in IOP treatment to better manage mental health symptoms, stabilization and to address treatment plan goals. Clinician recommends adherence to crisis/safety plan, taking medications as prescribed, and following up with medical professionals if any issues arise.     Follow Up Instructions: Clinician will send Webex link for next session. The Client was advised to call back or seek an in-person evaluation if the symptoms worsen or if the condition fails to improve as anticipated.     I provided 180 minutes of non-face-to-face time during this encounter.  Loistine Chance, LCMHCA, LCASA

## 2020-11-26 ENCOUNTER — Ambulatory Visit: Payer: PRIVATE HEALTH INSURANCE | Admitting: Psychology

## 2020-11-26 ENCOUNTER — Other Ambulatory Visit (HOSPITAL_COMMUNITY): Payer: 59 | Admitting: Professional

## 2020-11-26 ENCOUNTER — Other Ambulatory Visit: Payer: Self-pay

## 2020-11-26 DIAGNOSIS — F332 Major depressive disorder, recurrent severe without psychotic features: Secondary | ICD-10-CM

## 2020-11-26 DIAGNOSIS — F411 Generalized anxiety disorder: Secondary | ICD-10-CM

## 2020-11-28 ENCOUNTER — Encounter (HOSPITAL_COMMUNITY): Payer: Self-pay | Admitting: Family

## 2020-11-29 ENCOUNTER — Ambulatory Visit (INDEPENDENT_AMBULATORY_CARE_PROVIDER_SITE_OTHER): Payer: 59 | Admitting: Cardiology

## 2020-11-29 ENCOUNTER — Other Ambulatory Visit (HOSPITAL_COMMUNITY): Payer: 59 | Admitting: Psychiatry

## 2020-11-29 ENCOUNTER — Encounter: Payer: Self-pay | Admitting: Cardiology

## 2020-11-29 ENCOUNTER — Other Ambulatory Visit: Payer: Self-pay

## 2020-11-29 VITALS — BP 128/82 | HR 56 | Ht 60.0 in | Wt 177.0 lb

## 2020-11-29 DIAGNOSIS — R072 Precordial pain: Secondary | ICD-10-CM | POA: Diagnosis not present

## 2020-11-29 DIAGNOSIS — I493 Ventricular premature depolarization: Secondary | ICD-10-CM | POA: Diagnosis not present

## 2020-11-29 DIAGNOSIS — E782 Mixed hyperlipidemia: Secondary | ICD-10-CM

## 2020-11-29 DIAGNOSIS — R0600 Dyspnea, unspecified: Secondary | ICD-10-CM | POA: Diagnosis not present

## 2020-11-29 DIAGNOSIS — R0609 Other forms of dyspnea: Secondary | ICD-10-CM

## 2020-11-29 DIAGNOSIS — F332 Major depressive disorder, recurrent severe without psychotic features: Secondary | ICD-10-CM | POA: Diagnosis not present

## 2020-11-29 DIAGNOSIS — F411 Generalized anxiety disorder: Secondary | ICD-10-CM

## 2020-11-29 MED ORDER — ATORVASTATIN CALCIUM 40 MG PO TABS: 40.0000 mg | ORAL_TABLET | Freq: Every day | ORAL | 5 refills | Status: DC

## 2020-11-29 NOTE — Progress Notes (Signed)
Virtual Visit via Video Note   I connected with Angel Rodriguez on 11/26/20 at  9:00 AM EDT by a video enabled telemedicine application and verified that I am speaking with the correct person using two identifiers.   At orientation to the IOP program, Case Manager discussed the limitations of evaluation and management by telemedicine and the availability of in person appointments. The patient expressed understanding and agreed to proceed with virtual visits throughout the duration of the program.   Location:  Patient: Patient Home Provider: Clinical Home Office   History of Present Illness: MDD, PTSD, and GAD   Observations/Objective: Check In: Case Manager checked in with all participants to review discharge dates, insurance authorizations, work-related documents and needs from the treatment team regarding medications. Client stated needs and engaged in discussion.   Initial Therapeutic Activity: Counselor facilitated a check-in with group members to assess mood and current functioning. Client shared details of their mental health management since our last session, including challenges and successes. Client arrived within time allowed and reports that she is feeling "pretty good." Client rates her mood at a 5 on a scale of 1-10 with 10 being great. Client presents with severe depression and severe anxiety. Client denied any current SI/HI. Client reports sleep is "good" and appetite is "better." Pt reports she spent yesterday advocating for her daughter and got out of the house 3x which "felt good." Pt able to process.   Second Therapeutic Activity: Second Therapeutic Activity: Counselor facilitated group on distress tolerance skills. Group discussed "ACCEPTS" and "Self-Soothe" skills and how/when clients can employ these methods to help.  Clients identified when these techniques may be helpful in their personal lives. Client able to participate and process.       Check Out: Counselor  closed program and prompted group members to identify one self-care practice or productivity activity they would like to engage in today. Client endorsed safety plan to be followed to prevent safety issues.   Assessment and Plan: Clinician recommends that Client remain in IOP treatment to better manage mental health symptoms, stabilization and to address treatment plan goals. Clinician recommends adherence to crisis/safety plan, taking medications as prescribed, and following up with medical professionals if any issues arise.     Follow Up Instructions: Clinician will send Webex link for next session. The Client was advised to call back or seek an in-person evaluation if the symptoms worsen or if the condition fails to improve as anticipated.     I provided 180 minutes of non-face-to-face time during this encounter.  Loistine Chance, [facility], [place] 11/26/20

## 2020-11-29 NOTE — Patient Instructions (Signed)
Medication Instructions:   Your physician has recommended you make the following change in your medication:    START taking Lipitor 40 MG once a day.  *If you need a refill on your cardiac medications before your next appointment, please call your pharmacy*   Lab Work:  Your physician recommends that you return for a FASTING lipid profile: IN 6 WEEKS  - You will need to be fasting. Please do not have anything to eat or drink after midnight the morning you have the lab work. You may only have water or black coffee with no cream or sugar.   Please come to our office on ___________________at____________________am/pm   Testing/Procedures:   Your physician has requested that you have an echocardiogram. Echocardiography is a painless test that uses sound waves to create images of your heart. It provides your doctor with information about the size and shape of your heart and how well your heart's chambers and valves are working. This procedure takes approximately one hour. There are no restrictions for this procedure.  2.   [place]      Your caregiver has ordered a Stress Test with nuclear imaging. The purpose of this test is to evaluate the blood supply to your heart muscle. This procedure is referred to as a "Non-Invasive Stress Test." This is because other than having an IV started in your vein, nothing is inserted or "invades" your body. Cardiac stress tests are done to find areas of poor blood flow to the heart by determining the extent of coronary artery disease (CAD). Some patients exercise on a treadmill, which naturally increases the blood flow to your heart, while others who are  unable to walk on a treadmill due to physical limitations have a pharmacologic/chemical stress agent called Lexiscan . This medicine will mimic walking on a treadmill by temporarily increasing your coronary blood flow.      PLEASE REPORT TO [facility] MEDICAL MALL ENTRANCE   THE VOLUNTEERS AT THE FIRST DESK  WILL DIRECT YOU WHERE TO GO     *Please note: these test may take anywhere between 2-4 hours to complete       Date of Procedure:_____________________________________   Arrival Time for Procedure:______________________________    PLEASE NOTIFY THE OFFICE AT LEAST 24 HOURS IN ADVANCE IF YOU ARE UNABLE TO KEEP YOUR APPOINTMENT.  [place] 24 HOURS IN ADVANCE IF YOU ARE UNABLE TO KEEP YOUR APPOINTMENT. [phone]         How to prepare for your Myoview test:         __XX__:  Hold diabetes medication the morning of procedure: metFORMIN (GLUCOPHAGE)    1. Do not eat or drink after midnight  2. No caffeine for 24 hours prior to test  3. No smoking 24 hours prior to test.  4. Unless instructed otherwise, Take your medication with a small sips of water.    5.         Ladies, please do not wear dresses. Skirts or pants are appropriate. Please wear a short sleeve shirt.  6. No perfume, cologne or lotion.  7. Wear comfortable walking shoes. No heels!     Follow-Up: At [facility], you and your health needs are our priority.  As part of our continuing mission to provide you with exceptional heart care, we have created designated Provider Care Teams.  These Care Teams include your primary Cardiologist (physician) and Advanced Practice Providers (APPs -  Physician Assistants  and Nurse Practitioners) who all work together to provide you with the care you need, when you need it.  We recommend signing up for the patient portal called "MyChart".  Sign up information is provided on this After Visit Summary.  MyChart is used to connect with patients for Virtual Visits (Telemedicine).  Patients are able to view lab/test results, encounter notes, upcoming appointments, etc.  Non-urgent messages can be sent to your provider as well.   To learn more about what you can do with MyChart, go to [email or web address].    Your next appointment:    6 week(s)  The format for your next appointment:   In Person  Provider:   Kate Sable, MD   Other Instructions

## 2020-11-29 NOTE — Progress Notes (Signed)
Cardiology Office Note:    Date:  11/29/2020   ID:  Angel Rodriguez, DOB [birth year], MRN [id number]  PCP:  Lorrene Reid, PA-C   [facility] Providers Cardiologist:  None     Referring MD: Britt Bottom, MD   Chief Complaint  Patient presents with   New Patient (Initial Visit)    Referred by PCP for PVC, Trigeminy, Bigeminy. Meds reviewed verbally with patient.    Angel Rodriguez is a 45 y.o. female who is being seen today for the evaluation of PVCs at the request of Sater, Nanine Means, MD.   History of Present Illness:    Angel Rodriguez is a 45 y.o. 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  Patient underwent sleep study due to history of snoring and excessive daytime sleepiness.  Frequent PVCs were noted during sleep study.  She endorsed shortness of breath with exertion ongoing over the past 8 months.  States having history of anxiety, follows up with group therapy sessions.  Mother had CAD in her 64s, underwent three-vessel CABG.  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  She was subsequently taper of prednisone, her weight has been improving.  A1c also was elevated, started on metformin.  She takes verapamil due to migraines.  Has occasional nonspecific chest pain.  Son who was present in the room states having elevated cholesterol.  Past Medical History:  Diagnosis Date   Anxiety    Blind right eye 2009   swelling & pressure   Cluster headaches    started after seizure and migraines   Complication of anesthesia    trouble with short term memory after surgeries   COVID-19    Depression    Fibroids    s/p TLH, bilateral salpingectomy   GERD (gastroesophageal reflux disease)    patient thinks due to medications   History of anemia    prior to hysterectomy   History of pneumonia    History of trichomoniasis     Seizures ([place]) x 1 2-3 yrs ago none since   once saw neurologist, full body brain MRI and another 6 months lster   Status post right oophorectomy    Thrombocytopenia ([place])    sees [place] with Heme Onc    Past Surgical History:  Procedure Laterality Date   ABDOMINAL HYSTERECTOMY     CYSTOSCOPY N/A 07/31/2017   Procedure: CYSTOSCOPY;  Surgeon: Megan Salon, MD;  Location: [facility];  Service: Gynecology;  Laterality: N/A;   ENDOMETRIAL ABLATION     EYE SURGERY     laser - removed blood from right eye   HYSTEROSCOPY WITH D & C     LAPAROSCOPY N/A 09/19/2017   Procedure: LAPAROSCOPY DIAGNOSTIC WITH RIGHT OOPHERECTOMY, LYSIS OF ADHESIONS;  Surgeon: Salvadore Dom, MD;  Location: [facility];  Service: Gynecology;  Laterality: N/A;   OMENTECTOMY N/A 11/21/2017   Procedure: OMENTECTOMY AND PELVIC WASHINGS;  Surgeon: Isabel Caprice, MD;  Location: [facility];  Service: Gynecology;  Laterality: N/A;   ROBOTIC ASSISTED SALPINGO OOPHERECTOMY Left 11/21/2017   Procedure: XI ROBOTIC ASSISTED LEFT SALPINGO OOPHORECTOMY;  Surgeon: Isabel Caprice, MD;  Location: [facility];  Service: Gynecology;  Laterality: Left;   TOTAL LAPAROSCOPIC HYSTERECTOMY WITH SALPINGECTOMY Bilateral 07/31/2017   Procedure: TOTAL LAPAROSCOPIC HYSTERECTOMY WITH SALPINGECTOMY;  Surgeon: Megan Salon, MD;  Location: [facility];  Service: Gynecology;  Laterality: Bilateral;  20 week size uterus/ Alexis bag in room/ need 4.5 hours   TUBAL LIGATION     WISDOM TOOTH EXTRACTION      Current Medications: Current Meds  Medication Sig   Accu-Chek Softclix Lancets lancets Use as instructed   Acetaminophen-Codeine 300-30 MG tablet ONE TO TWO AS NEEDED FOR HEADACHE. NO MORE THAN 4/DAY   Ascorbic Acid (VITAMIN C) 1000 MG tablet Take 1,000 mg by mouth daily.   atorvastatin (LIPITOR) 40 MG tablet Take 1 tablet (40 mg total) by mouth daily.   atropine 1 % ophthalmic solution Place 1 drop into the right eye 3 (three) times daily.    blood glucose  meter kit and supplies Dispense based on patient and insurance preference. Use to check fasting blood sugar and 2 hrs after largest meal. (FOR ICD-10 E10.9, E11.9).   brimonidine (ALPHAGAN P) 0.1 % SOLN Place 1 drop into the right eye 3 (three) times daily.    calcium carbonate (TUMS EX) 750 MG chewable tablet Chew 2 tablets by mouth daily as needed for heartburn.    clonazePAM (KLONOPIN) 1 MG tablet Take 1 tablet (1 mg total) by mouth 2 (two) times daily as needed for anxiety.   dorzolamide-timolol (COSOPT) 22.3-6.8 MG/ML ophthalmic solution Place 1 drop into the right eye 2 (two) times daily.    fluticasone (FLONASE) 50 MCG/ACT nasal spray Place 1-2 sprays into both nostrils daily as needed for allergies.    glucose blood (ACCU-CHEK GUIDE) test strip Use to check fasting glucose and 2 hours after largest meal   Lacosamide 100 MG TABS Take 1 tablet (100 mg total) by mouth 2 (two) times daily.   latanoprost (XALATAN) 0.005 % ophthalmic solution Place 1 drop into both eyes at bedtime.   metFORMIN (GLUCOPHAGE) 500 MG tablet Take 1 tablet (500 mg total) by mouth 2 (two) times daily with a meal.   nystatin cream (MYCOSTATIN) Apply 1 application topically 2 (two) times daily. Apply to affected area BID for up to 7 days.   SUMAtriptan (IMITREX) 100 MG tablet Take 1 tablet (100 mg total) by mouth once as needed for migraine. May repeat in 2 hours if headache persists or recurs.   verapamil (CALAN-SR) 240 MG CR tablet TAKE 1 TABLET BY MOUTH AT BEDTIME.   Vitamin D, Ergocalciferol, (DRISDOL) 1.25 MG (50000 UNIT) CAPS capsule Take 1 capsule (50,000 Units total) by mouth every 7 (seven) days.     Allergies:   Ivp dye [iodinated diagnostic agents] and Bee venom   Social History   Socioeconomic History   Marital status: Single    Spouse name: Not on file   Number of children: 3   Years of education: Not on file   Highest education level: Associate degree: occupational, Hotel manager, or vocational program   Occupational History   Not on file  Tobacco Use   Smoking status: Former    Years: 20.00    Types: Cigarettes, E-cigarettes    Quit date: 05/05/2018    Years since quitting: 2.5   Smokeless tobacco: Never  Vaping Use   Vaping Use: Former  Substance and Sexual Activity   Alcohol use: Not Currently   Drug use: No   Sexual activity: Yes    Birth control/protection: Surgical    Comment: hysterectomy  Other Topics Concern   Not on file  Social History Narrative   Not on file   Social Determinants of Health   Financial Resource Strain: Not on file  Food Insecurity: Not on file  Transportation Needs: Not on file  Physical Activity: Not on file  Stress: Not on file  Social Connections: Not on file     Family History: The patient's family history includes Alcohol abuse in her father; Alcoholism in her father; Cancer in her maternal grandmother; Cirrhosis in her father; Drug abuse in her cousin; Heart disease in her maternal aunt.  ROS:   Please see the history of present illness.     All other systems reviewed and are negative.  EKGs/Labs/Other Studies Reviewed:    The following studies were reviewed today:   EKG:  EKG is  ordered today.  The ekg ordered today demonstrates sinus bradycardia, nonspecific T wave changes.  Recent Labs: 07/09/2020: TSH 1.730 08/16/2020: Hemoglobin 13.9; Platelets 71 09/02/2020: ALT 95; BUN 9; Creatinine, Ser 0.71; Potassium 3.9; Sodium 141  Recent Lipid Panel    Component Value Date/Time   CHOL 368 (H) 07/09/2020 0958   TRIG 596 (HH) 07/09/2020 0958   HDL 48 07/09/2020 0958   CHOLHDL 7.7 (H) 07/09/2020 0958   CHOLHDL 5.9 03/14/2018 0316   VLDL 58 (H) 03/14/2018 0316   LDLCALC 193 (H) 07/09/2020 0958     Risk Assessment/Calculations:          Physical Exam:    VS:  BP 128/82 (BP Location: Left Arm, Patient Position: Sitting, Cuff Size: Normal)   Pulse (!) 56   Ht 5' (1.524 m)   Wt 177 lb (80.3 kg)   LMP 05/02/2017   SpO2 97%    BMI 34.57 kg/m     Wt Readings from Last 3 Encounters:  11/29/20 177 lb (80.3 kg)  10/21/20 186 lb 3.2 oz (84.5 kg)  09/08/20 191 lb (86.6 kg)     GEN:  Well nourished, well developed in no acute distress HEENT: Normal NECK: No JVD; No carotid bruits LYMPHATICS: No lymphadenopathy CARDIAC: RRR, no murmurs, rubs, gallops RESPIRATORY:  Clear to auscultation without rales, wheezing or rhonchi  ABDOMEN: Soft, non-tender, non-distended MUSCULOSKELETAL:  No edema; No deformity  SKIN: Warm and dry NEUROLOGIC:  Alert and oriented x 3 PSYCHIATRIC:  Normal affect   ASSESSMENT:    1. PVC (premature ventricular contraction)   2. Mixed hyperlipidemia   3. Dyspnea on exertion   4. Precordial pain    PLAN:    In order of problems listed above:  Frequent PVCs noted during sleep eval.  Likely from obesity, caffeine intake.  Denies palpitations, patient cut back on caffeine, is also losing weight.  Continue to monitor. Mixed hyperlipidemia, LDL over 190.  Obtain genetic testing, patient may have FH.  Start Lipitor 40 mg daily.  Repeat lipid panel in 6 weeks. Dyspnea on exertion, nonspecific chest pain.  Family history of CAD, patient has diabetes, elevated lipids.  Get echocardiogram, get Lexiscan Myoview.  Patient is allergic to iodinated contrast agents.  Follow-up after cardiac testing.   Shared Decision Making/Informed Consent The risks [chest pain, shortness of breath, cardiac arrhythmias, dizziness, blood pressure fluctuations, myocardial infarction, stroke/transient ischemic attack, nausea, vomiting, allergic reaction, radiation exposure, metallic taste sensation and life-threatening complications (estimated to be 1 in 10,000)], benefits (risk stratification, diagnosing coronary artery disease, treatment guidance) and alternatives of a nuclear stress test were discussed in detail with Angel Rodriguez and she agrees to proceed.   Medication Adjustments/Labs and Tests Ordered: Current  medicines are reviewed at length with the patient today.  Concerns regarding medicines are outlined above.  Orders Placed This Encounter  Procedures  NM Myocar Multi W/Spect W/Wall Motion / EF   Familial Hypercholesterolemia (COHESION)   Lipid panel   Ambulatory referral to Genetics   EKG 12-Lead   ECHOCARDIOGRAM COMPLETE    Meds ordered this encounter  Medications   atorvastatin (LIPITOR) 40 MG tablet    Sig: Take 1 tablet (40 mg total) by mouth daily.    Dispense:  30 tablet    Refill:  5     Patient Instructions  Medication Instructions:   Your physician has recommended you make the following change in your medication:    START taking Lipitor 40 MG once a day.  *If you need a refill on your cardiac medications before your next appointment, please call your pharmacy*   Lab Work:  Your physician recommends that you return for a FASTING lipid profile: IN 6 WEEKS  - You will need to be fasting. Please do not have anything to eat or drink after midnight the morning you have the lab work. You may only have water or black coffee with no cream or sugar.   Please come to our office on ___________________at____________________am/pm   Testing/Procedures:   Your physician has requested that you have an echocardiogram. Echocardiography is a painless test that uses sound waves to create images of your heart. It provides your doctor with information about the size and shape of your heart and how well your heart's chambers and valves are working. This procedure takes approximately one hour. There are no restrictions for this procedure.  2.   [place]      Your caregiver has ordered a Stress Test with nuclear imaging. The purpose of this test is to evaluate the blood supply to your heart muscle. This procedure is referred to as a "Non-Invasive Stress Test." This is because other than having an IV started in your vein, nothing is inserted or "invades" your body. Cardiac stress  tests are done to find areas of poor blood flow to the heart by determining the extent of coronary artery disease (CAD). Some patients exercise on a treadmill, which naturally increases the blood flow to your heart, while others who are  unable to walk on a treadmill due to physical limitations have a pharmacologic/chemical stress agent called Lexiscan . This medicine will mimic walking on a treadmill by temporarily increasing your coronary blood flow.      PLEASE REPORT TO [facility] ENTR[facility]   THE VOLUNTEERS AT THE FIRST DESK WILL DIRECT YOU WHERE TO GO     *Please note: these test may take anywhere between 2-4 hours to complete       Date of Procedure:_____________________________________   Arrival Time for Procedure:______________________________    PLEASE NOTIFY THE OFFICE AT LEAST 24 HOURS IN ADVANCE IF YOU ARE UNABLE TO KEEP YOUR APPOINTMENT.  [place] 24 HOURS IN ADVANCE IF YOU ARE UNABLE TO KEEP YOUR APPOINTMENT. [phone]         How to prepare for your Myoview test:         __XX__:  Hold diabetes medication the morning of procedure: metFORMIN (GLUCOPHAGE)    1. Do not eat or drink after midnight  2. No caffeine for 24 hours prior to test  3. No smoking 24 hours prior to test.  4. Unless instructed otherwise, Take your medication with a small sips of water.    5.         Ladies, please do not wear dresses. Skirts  or pants are appropriate. Please wear a short sleeve shirt.  6. No perfume, cologne or lotion.  7. Wear comfortable walking shoes. No heels!     Follow-Up: At [facility], you and your health needs are our priority.  As part of our continuing mission to provide you with exceptional heart care, we have created designated Provider Care Teams.  These Care Teams include your primary Cardiologist (physician) and Advanced Practice Providers (APPs -  Physician Assistants and Nurse Practitioners)  who all work together to provide you with the care you need, when you need it.  We recommend signing up for the patient portal called "MyChart".  Sign up information is provided on this After Visit Summary.  MyChart is used to connect with patients for Virtual Visits (Telemedicine).  Patients are able to view lab/test results, encounter notes, upcoming appointments, etc.  Non-urgent messages can be sent to your provider as well.   To learn more about what you can do with MyChart, go to [email or web address].    Your next appointment:   6 week(s)  The format for your next appointment:   In Person  Provider:   Kate Sable, MD   Other Instructions    Signed, Kate Sable, MD  11/29/2020 5:09 PM    [place]

## 2020-11-30 ENCOUNTER — Other Ambulatory Visit (HOSPITAL_COMMUNITY): Payer: 59 | Admitting: Psychiatry

## 2020-11-30 DIAGNOSIS — F332 Major depressive disorder, recurrent severe without psychotic features: Secondary | ICD-10-CM

## 2020-11-30 DIAGNOSIS — F411 Generalized anxiety disorder: Secondary | ICD-10-CM

## 2020-12-01 ENCOUNTER — Other Ambulatory Visit (HOSPITAL_COMMUNITY): Payer: 59 | Admitting: Professional

## 2020-12-01 ENCOUNTER — Encounter (HOSPITAL_COMMUNITY): Payer: Self-pay

## 2020-12-01 ENCOUNTER — Other Ambulatory Visit: Payer: Self-pay

## 2020-12-01 DIAGNOSIS — F411 Generalized anxiety disorder: Secondary | ICD-10-CM

## 2020-12-01 DIAGNOSIS — F332 Major depressive disorder, recurrent severe without psychotic features: Secondary | ICD-10-CM

## 2020-12-01 NOTE — Progress Notes (Signed)
Virtual Visit via Video Note  I connected with Angel Rodriguez on 11/29/20 at  9:00 AM EDT by a video enabled telemedicine application and verified that I am speaking with the correct person using two identifiers.   At orientation to the IOP program, Case Manager discussed the limitations of evaluation and management by telemedicine and the availability of in person appointments. The patient expressed understanding and agreed to proceed with virtual visits throughout the duration of the program.   Location:  Patient: Patient Home Provider: Home Office   History of Present Illness: MDD and GAD   Observations/Objective: Check In: Case Manager checked in with all participants to review discharge dates, insurance authorizations, work-related documents and needs from the treatment team regarding medications. Client stated needs and engaged in discussion.    Initial Therapeutic Activity: Counselor facilitated a check-in with group members to assess mood and current functioning. Client shared details of their mental health management since our last session, including challenges and successes. Counselor engaged group in discussion, covering the following topics: recreational activities for improving socialization and connections, community resources for various mental health support groups and programs, and top 3 life stressors and strategies for addressing issues. Client presents with moderate depression and moderate anxiety. Client denied any current SI/HI/psychosis.   Second Therapeutic Activity: Counselor prompted group members to compare and contrast their anxiety symptoms vs depressive symptoms. Counselor allowed time for reflection and for group to report out, discussing thoughts, triggers, physical symptoms, emotions and functioning. Counselor shared psychoeducation on the anxiety cycle and depressive cycle highlighting avoidance as a factor in increasing symptoms. Counselor shared a video from [facility], LMFT, Therapy in a Nutshell about addressing avoidance. Group members took note of takeaways and we discusses application skills.       Check Out: Counselor prompted group members to identify one self-care practice or productivity activity they would like to engage in today. Client endorsed safety plan to be followed to prevent safety issues.   Assessment and Plan: Clinician recommends that Client remain in IOP treatment to better manage mental health symptoms, stabilization and to address treatment plan goals. Clinician recommends adherence to crisis/safety plan, taking medications as prescribed, and following up with medical professionals if any issues arise.     Follow Up Instructions: Clinician will send Webex link for next session. The Client was advised to call back or seek an in-person evaluation if the symptoms worsen or if the condition fails to improve as anticipated.     I provided 180 minutes of non-face-to-face time during this encounter.     Lise Auer, LCSW

## 2020-12-01 NOTE — Progress Notes (Signed)
Virtual Visit via Video Note  I connected with Angel Rodriguez on 11/30/20 at  9:00 AM EDT by a video enabled telemedicine application and verified that I am speaking with the correct person using two identifiers.  At orientation to the IOP program, Case Manager discussed the limitations of evaluation and management by telemedicine and the availability of in person appointments. The patient expressed understanding and agreed to proceed with virtual visits throughout the duration of the program.   Location:  Patient: Patient Home Provider: Home Office   History of Present Illness: MDD and GAD  Observations/Objective: Check In: Case Manager checked in with all participants to review discharge dates, insurance authorizations, work-related documents and needs from the treatment team regarding medications. Client stated needs and engaged in discussion.   Initial Therapeutic Activity: Counselor facilitated a check-in with group members to assess mood and current functioning. Client shared details of their mental health management since our last session, including challenges and successes. Counselor engaged group in discussion, covering the following topics: discharge planning, follow up with appropriate providers and programs, applying skills to reduce avoidant behaviors and assertive communication of basic and therapeutic needs. Client presents with moderate depression and moderate anxiety. Client denied any current SI/HI/psychosis.  Second Therapeutic Activity: Counselor prompted group members to identify challenges in managing mental health in the workplace. Group members shared unique and global issues they face. Counselor provided strategies for stress management in the workplace through two videos created by a MD and a Dr of Psychology. Group Members identified which strategies they plan to implement to achieve better work-life balance and in prevention of mental health breakdowns due to work-related  stressors.   Check Out: Counselor prompted group members to identify one self-care practice or productivity activity they would like to engage in today. Client endorsed safety plan to be followed to prevent safety issues.  Assessment and Plan: Clinician recommends that Client remain in IOP treatment to better manage mental health symptoms, stabilization and to address treatment plan goals. Clinician recommends adherence to crisis/safety plan, taking medications as prescribed, and following up with medical professionals if any issues arise.    Follow Up Instructions: Clinician will send Webex link for next session. The Client was advised to call back or seek an in-person evaluation if the symptoms worsen or if the condition fails to improve as anticipated.     I provided 180 minutes of non-face-to-face time during this encounter.     Lise Auer, LCSW

## 2020-12-02 ENCOUNTER — Other Ambulatory Visit: Payer: Self-pay

## 2020-12-02 ENCOUNTER — Other Ambulatory Visit (HOSPITAL_COMMUNITY): Payer: 59 | Admitting: Professional

## 2020-12-02 DIAGNOSIS — F411 Generalized anxiety disorder: Secondary | ICD-10-CM

## 2020-12-02 DIAGNOSIS — F332 Major depressive disorder, recurrent severe without psychotic features: Secondary | ICD-10-CM

## 2020-12-03 ENCOUNTER — Other Ambulatory Visit (HOSPITAL_COMMUNITY): Payer: 59 | Admitting: Professional

## 2020-12-03 ENCOUNTER — Encounter (HOSPITAL_COMMUNITY): Payer: Self-pay | Admitting: Family

## 2020-12-03 ENCOUNTER — Other Ambulatory Visit: Payer: Self-pay

## 2020-12-03 DIAGNOSIS — F332 Major depressive disorder, recurrent severe without psychotic features: Secondary | ICD-10-CM

## 2020-12-03 DIAGNOSIS — F411 Generalized anxiety disorder: Secondary | ICD-10-CM

## 2020-12-03 DIAGNOSIS — F324 Major depressive disorder, single episode, in partial remission: Secondary | ICD-10-CM

## 2020-12-03 MED ORDER — VORTIOXETINE HBR 10 MG PO TABS: 10.0000 mg | ORAL_TABLET | Freq: Every day | ORAL | 0 refills | Status: DC

## 2020-12-03 NOTE — Progress Notes (Signed)
Virtual Visit via Video Note  I connected with Angel Rodriguez on 12/02/20 at  9:00 AM EDT by a video enabled telemedicine application and verified that I am speaking with the correct person using two identifiers.  At orientation to the IOP program, Case Manager discussed the limitations of evaluation and management by telemedicine and the availability of in person appointments. The patient expressed understanding and agreed to proceed with virtual visits throughout the duration of the program.   Location:  Patient: Patient Home Provider: Home Office   History of Present Illness: MDD and GAD  Observations/Objective: Check In: Case Manager checked in with all participants to review discharge dates, insurance authorizations, work-related documents and needs from the treatment team regarding medications. Client stated needs and engaged in discussion.   Initial Therapeutic Activity: Counselor facilitated a check-in with group members to assess mood and current functioning. Client shared details of their mental health management, including challenges and successes. Client arrived within time allowed and reports that she is feeling "OK." Client rates her mood at a 4 on a scale of 1-10 with 10 being great. Counselor engaged group in discussion, covering the following topics: codependence, feeling like a burden, and talking with boundaries. Client presents with depression and anxiety. Client denied any current SI/HI. Client reports sleep is "terrible" and appetite is "getting better."   Second Therapeutic Activity: Counselor introduced the [employer]. Group discussed how thoughts, behaviors, and emotions related to a situation can affect how the situation is managed.   Third Therapeutic Activity: Counselor introduced Jerene Pitch, [facility] to present information and discussion on community and values. Group members engaged in discussion, sharing how strength looks to them and where the  definition of strength comes from.  Check Out: Counselor prompted group members to identify one self-care practice or productivity activity they would like to engage in today. Client endorsed safety plan to be followed to prevent safety issues.  Assessment and Plan: Clinician recommends that Client remain in IOP treatment to better manage mental health symptoms, stabilization and to address treatment plan goals. Clinician recommends adherence to crisis/safety plan, taking medications as prescribed, and following up with medical professionals if any issues arise.    Follow Up Instructions: Clinician will send Webex link for next session. The Client was advised to call back or seek an in-person evaluation if the symptoms worsen or if the condition fails to improve as anticipated.     I provided 180 minutes of non-face-to-face time during this encounter.     Loistine Chance, LCMHCA, LCASA

## 2020-12-03 NOTE — Progress Notes (Signed)
Patient is requesting a medication refill. Stated that she has been on the medication for the past 4 months However hasn't found a mental health care provider to refill her medications. Stated that she was prescribed  Trintellix 10 mg  x 3 tablets daily to which she has been taken as directed.  NP will make Trintellix 30 mg  po daily x 30 days.  Patient to follow-up with outpatient provider. Reported she has a follow up appointment  with the [facility] Oct 6th,2022.

## 2020-12-03 NOTE — Progress Notes (Signed)
Virtual Visit via Video Note  I connected with Angel Rodriguez on 12/03/20 at  9:00 AM EDT by a video enabled telemedicine application and verified that I am speaking with the correct person using two identifiers.  At orientation to the IOP program, Case Manager discussed the limitations of evaluation and management by telemedicine and the availability of in person appointments. The patient expressed understanding and agreed to proceed with virtual visits throughout the duration of the program.   Location:  Patient: Patient Home Provider: Home Office   History of Present Illness: MDD and GAD  Observations/Objective: Check In: Case Manager checked in with all participants to review discharge dates, insurance authorizations, work-related documents and needs from the treatment team regarding medications. Client stated needs and engaged in discussion.   Initial Therapeutic Activity: Counselor facilitated a check-in with group members to assess mood and current functioning. Client shared details of their mental health management, including challenges and successes. Client arrived within time allowed and reports that she is feeling "OK." Client rates her mood at a 5 on a scale of 1-10 with 10 being great. Counselor engaged group in discussion, covering the following topics: codependence, being optimistic, and accepting opportunities as they arise. Client presents with depression and anxiety. Client denied any current SI/HI. Client reports sleep and appetite are not good.  Second Therapeutic Activity: Counselor introduced the cognitive distortions. Group discussed common cognitive distortions and how to "catch, challenge, change". Group participate in an activity to identify cognitive distortions and which challenges to use.   Check Out: Counselor prompted group members to identify one self-care practice or productivity activity they would like to engage in today. Client endorsed safety plan to be followed  to prevent safety issues.  Assessment and Plan: Clinician recommends that Client remain in IOP treatment to better manage mental health symptoms, stabilization and to address treatment plan goals. Clinician recommends adherence to crisis/safety plan, taking medications as prescribed, and following up with medical professionals if any issues arise.    Follow Up Instructions: Clinician will send Webex link for next session. The Client was advised to call back or seek an in-person evaluation if the symptoms worsen or if the condition fails to improve as anticipated.     I provided 180 minutes of non-face-to-face time during this encounter.     Loistine Chance, LCMHCA, LCASA

## 2020-12-03 NOTE — Progress Notes (Signed)
Virtual Visit via Video Note  I connected with KASSADI PRESSWOOD on 12/01/20 at  9:00 AM EDT by a video enabled telemedicine application and verified that I am speaking with the correct person using two identifiers.  At orientation to the IOP program, Case Manager discussed the limitations of evaluation and management by telemedicine and the availability of in person appointments. The patient expressed understanding and agreed to proceed with virtual visits throughout the duration of the program.   Location:  Patient: Patient Home Provider: Home Office   History of Present Illness: MDD and GAD  Observations/Objective: Check In: Case Manager checked in with all participants to review discharge dates, insurance authorizations, work-related documents and needs from the treatment team regarding medications. Client stated needs and engaged in discussion.   Initial Therapeutic Activity: Counselor facilitated a check-in with group members to assess mood and current functioning. Client shared details of their mental health management, including challenges and successes. Client arrived within time allowed and reports that she is feeling "OK." Client rates her mood at a 5 on a scale of 1-10 with 10 being great. Counselor engaged group in discussion, covering the following topics: rejection, stigma related to mental health, self-care, and dating while focusing on mental health journey. Client presents with depression and anxiety. Client denied any current SI/HI. Client reports sleep is "OK" and appetite is "improving."    Second Therapeutic Activity: Counselor introduced Jerene Pitch, [facility]plain to present information and discussion on strength. Group members engaged in discussion, sharing how strength looks to them and where the definition of strength comes from.  Check Out: Counselor prompted group members to identify one self-care practice or productivity activity they would like to engage in today.  Client endorsed safety plan to be followed to prevent safety issues.  Assessment and Plan: Clinician recommends that Client remain in IOP treatment to better manage mental health symptoms, stabilization and to address treatment plan goals. Clinician recommends adherence to crisis/safety plan, taking medications as prescribed, and following up with medical professionals if any issues arise.    Follow Up Instructions: Clinician will send Webex link for next session. The Client was advised to call back or seek an in-person evaluation if the symptoms worsen or if the condition fails to improve as anticipated.     I provided 180 minutes of non-face-to-face time during this encounter.     Loistine Chance, LCMHCA, LCASA

## 2020-12-06 ENCOUNTER — Other Ambulatory Visit (HOSPITAL_COMMUNITY): Payer: 59 | Attending: Psychiatry | Admitting: Licensed Clinical Social Worker

## 2020-12-06 ENCOUNTER — Other Ambulatory Visit: Payer: Self-pay

## 2020-12-06 DIAGNOSIS — F332 Major depressive disorder, recurrent severe without psychotic features: Secondary | ICD-10-CM

## 2020-12-06 DIAGNOSIS — F32A Depression, unspecified: Secondary | ICD-10-CM | POA: Insufficient documentation

## 2020-12-06 DIAGNOSIS — F419 Anxiety disorder, unspecified: Secondary | ICD-10-CM | POA: Insufficient documentation

## 2020-12-06 DIAGNOSIS — F411 Generalized anxiety disorder: Secondary | ICD-10-CM

## 2020-12-06 NOTE — Progress Notes (Signed)
Virtual Visit via Video Note   I connected with Angel Rodriguez. Angel Rodriguez on 12/06/20 at  9:00 AM EDT by a video enabled telemedicine application and verified that I am speaking with the correct person using two identifiers.   At orientation to the IOP program, Case Manager discussed the limitations of evaluation and management by telemedicine and the availability of in person appointments. The patient expressed understanding and agreed to proceed with virtual visits throughout the duration of the program.   Location:  Patient: Patient Home Provider: [facility] [place] [facility]   History of Present Illness: MDD and GAD   Observations/Objective: Check In: Case Manager checked in with all participants to review discharge dates, insurance authorizations, work-related documents and needs from the treatment team regarding medications. Angel Rodriguez stated needs and engaged in discussion.    Initial Therapeutic Activity: Counselor facilitated a check-in with Angel Rodriguez to assess for safety, sobriety and medication compliance.  Clinician also inquired about Angel Rodriguez's current emotional ratings, as well as any significant changes in thoughts, feelings or behavior since previous check in.  Angel Rodriguez presented for session on time and was alert, oriented x5, with no evidence or self-report of SI/HI or A/V H.  Angel Rodriguez reported compliance with medication and denied use of alcohol or illicit substances.  Angel Rodriguez reported scores of 5/10 for depression, 6/10 for anxiety, and 8/10 for anger/irritability.  Angel Rodriguez denied any recent panic attacks.  Angel Rodriguez reported recent success of taking vitamin D to improve her energy and motivation level.  Angel Rodriguez reported that this allowed her to help her son clean his room the other day.  Angel Rodriguez reported present struggle of dealing with bouts of irritability, and needing an outlet for difficult feelings.      Second Therapeutic Activity: Counselor introduced topic of self-care today.  Counselor explained how this can be  defined as the things one does to maintain good health and improve well-being.  Counselor provided members with a self-care assessment form to complete.  This handout featured various sub-categories of self-care, including physical, psychological/emotional, social, spiritual, and professional.  Members were asked to rank their engagement in the activities listed for each dimension on a scale of 1-3, with 1 indicating 'Poor', 2 indicating 'Lastrup', and 3 indicating 'Well'.  Counselor invited members to share results of their assessment, and inquired about which areas of self-care they are doing well in, as well as areas that require attention, and how they plan to begin addressing this during treatment.  Intervention was effective, as evidenced by Angel Rodriguez completing initial 2 sections of self-care assessment, reporting that she is excelling in areas such as attending medical appointments, and spending more quality time with supports, but would benefit from focusing upon eating regularly, getting more exercise, and learning to express feelings in healthier ways.  Angel Rodriguez reported that she would plan to address self-care deficits by making an appointment with a nutritionist to help with improvement of diet, developing a workout routine that is not too demanding on her body, and using group as a safe space to open up about challenges when necessary for increased support.      Assessment and Plan: Counselor recommends that Angel Rodriguez remain in IOP treatment to better manage mental health symptoms, ensure stability and pursue completion of treatment plan goals. Counselor recommends adherence to crisis/safety plan, taking medications as prescribed, and following up with medical professionals if any issues arise.     Follow Up Instructions: Counselor will send Webex link for next session. Angel Rodriguez was advised to call back or  seek an in-person evaluation if the symptoms worsen or if the condition fails to improve as  anticipated.     I provided 180 minutes of non-face-to-face time during this encounter.     Shade Flood, LCSW, LCAS 12/06/20

## 2020-12-07 ENCOUNTER — Telehealth (HOSPITAL_COMMUNITY): Payer: Self-pay | Admitting: Psychiatry

## 2020-12-07 ENCOUNTER — Other Ambulatory Visit: Payer: Self-pay

## 2020-12-07 ENCOUNTER — Ambulatory Visit
Admission: RE | Admit: 2020-12-07 | Discharge: 2020-12-07 | Disposition: A | Discharge status: Home / Self Care | Payer: 59 | Source: Ambulatory Visit | Attending: Cardiology | Admitting: Cardiology

## 2020-12-07 DIAGNOSIS — R0609 Other forms of dyspnea: Secondary | ICD-10-CM | POA: Diagnosis not present

## 2020-12-07 DIAGNOSIS — R072 Precordial pain: Secondary | ICD-10-CM | POA: Diagnosis not present

## 2020-12-07 LAB — NM MYOCAR MULTI W/SPECT W/WALL MOTION / EF
LV dias vol: 51 mL (ref 46–106)
LV sys vol: 19 mL
Nuc Stress EF: 63 %
Peak HR: 115 {beats}/min
Percent HR: 66 %
Rest HR: 64 {beats}/min
Rest Nuclear Isotope Dose: 10.2 mCi
ST Depression (mm): 0 mm
Stress Nuclear Isotope Dose: 29.7 mCi
TID: 0.8

## 2020-12-07 MED ORDER — TECHNETIUM TC 99M TETROFOSMIN IV KIT
10.0000 | PACK | Freq: Once | INTRAVENOUS | Status: AC | PRN
  Administered 2020-12-07: 10.23 via INTRAVENOUS

## 2020-12-07 MED ORDER — TECHNETIUM TC 99M TETROFOSMIN IV KIT
30.0000 | PACK | Freq: Once | INTRAVENOUS | Status: AC
  Administered 2020-12-07: 29.66 via INTRAVENOUS

## 2020-12-07 MED ORDER — REGADENOSON 0.4 MG/5ML IV SOLN
0.4000 mg | Freq: Once | INTRAVENOUS | Status: AC
  Administered 2020-12-07: 0.4 mg via INTRAVENOUS

## 2020-12-08 ENCOUNTER — Other Ambulatory Visit (HOSPITAL_COMMUNITY): Payer: 59 | Admitting: Licensed Clinical Social Worker

## 2020-12-08 DIAGNOSIS — F411 Generalized anxiety disorder: Secondary | ICD-10-CM

## 2020-12-08 DIAGNOSIS — F32A Depression, unspecified: Secondary | ICD-10-CM | POA: Diagnosis not present

## 2020-12-08 DIAGNOSIS — F332 Major depressive disorder, recurrent severe without psychotic features: Secondary | ICD-10-CM

## 2020-12-08 NOTE — Progress Notes (Signed)
Virtual Visit via Video Note   I connected with Angel Rodriguez. Smeltz on 12/08/20 at  9:00 AM EDT by a video enabled telemedicine application and verified that I am speaking with the correct person using two identifiers.   At orientation to the IOP program, Case Manager discussed the limitations of evaluation and management by telemedicine and the availability of in person appointments. The patient expressed understanding and agreed to proceed with virtual visits throughout the duration of the program.   Location:  Patient: Patient Home Provider: [facility] [place] [facility]   History of Present Illness: MDD and GAD   Observations/Objective: Check In: Case Manager checked in with all participants to review discharge dates, insurance authorizations, work-related documents and needs from the treatment team regarding medications. Angel Rodriguez stated needs and engaged in discussion.    Initial Therapeutic Activity: Counselor facilitated a check-in with Angel Rodriguez to assess for safety, sobriety and medication compliance.  Clinician also inquired about Angel Rodriguez's current emotional ratings, as well as any significant changes in thoughts, feelings or behavior since previous check in.  Angel Rodriguez presented for session on time and was alert, oriented x5, with no evidence or self-report of SI/HI or A/V H.  Angel Rodriguez reported compliance with medication and denied use of alcohol or illicit substances.  Angel Rodriguez reported scores of 6/10 for depression, 6/10 for anxiety, and 3/10 for anger/irritability.  Angel Rodriguez denied any recent panic attacks or outbursts.  Angel Rodriguez reported one success over the past day was intentionally parking some distance away from her doctor's office so that she would have to walk and get more exercise.    Second Therapeutic Activity: Counselor introduced Angel Rodriguez, [employer] Pharmacist, to provide psychoeducation on topic of medication compliance with members today.  Angel Rodriguez provided psychoeducation on classes of medications such as  antidepressants, antipsychotics, what symptoms they are intended to treat, and any side effects one might encounter while on a particular prescription.  Time was allowed for clients to ask any questions they might have of [name] regarding this specialty.  Intervention was effective, as evidenced by Angel Rodriguez participating in conversation on some side effects she was worried could be linked to a new medication she was prescribed.  Angel Rodriguez stated "I wanted to make sure that what was going on is normal".  Angel Rodriguez provided [place] with helpful information, and encouraged her to discontinue medication and outreach prescribing doctor if symptoms worsen.    Third Therapeutic Activity: Counselor discussed topic of gratitude journaling with members as a form of self-care.  Counselor virtually shared a handout with the group today which explained the benefits of this practice, including reduction in stress, increased happiness, and self-esteem.  Tips were also provided to aid in practice, such as taking time with entries, writing about people one is grateful for, and setting goal for two entries per week for at least 10-20 minutes at a time.  Counselor also provided group members with a variety of journaling prompts to choose from today, and encouraged each member to take time to write about something they are grateful for, with examples such as "Something beautiful I recently saw was..", "Something I can be proud of is.", "A reason to be excited for the future is." and more.  Members were encouraged to share their entry with the group, along with their perspective on the activity and motivation level towards making this a habit.  Intervention was effective, as evidenced by Angel Rodriguez participating in gratitude journaling activity, choosing prompt of "Someone that I admire is." and "Something beautiful I recently  saw was.".  Angel Rodriguez reported that she is grateful for her cousin since he is reliable and kind, and the beautiful thing she  recently saw was a cardinal the other day, which makes her think of loved ones that have passed away.  Angel Rodriguez reported that she enjoyed the activity and would consider adding this to daily routine, stating "I think it can bring a positive light into my day and remind me there is still good in the world".      Assessment and Plan: Counselor recommends that Angel Rodriguez remain in IOP treatment to better manage mental health symptoms, ensure stability and pursue completion of treatment plan goals. Counselor recommends adherence to crisis/safety plan, taking medications as prescribed, and following up with medical professionals if any issues arise.     Follow Up Instructions: Counselor will send Webex link for next session. Angel Rodriguez was advised to call back or seek an in-person evaluation if the symptoms worsen or if the condition fails to improve as anticipated.     I provided 180 minutes of non-face-to-face time during this encounter.     Shade Flood, LCSW, LCAS 12/08/20

## 2020-12-09 ENCOUNTER — Other Ambulatory Visit: Payer: Self-pay

## 2020-12-09 ENCOUNTER — Other Ambulatory Visit (HOSPITAL_COMMUNITY): Payer: 59 | Admitting: Licensed Clinical Social Worker

## 2020-12-09 DIAGNOSIS — F411 Generalized anxiety disorder: Secondary | ICD-10-CM

## 2020-12-09 DIAGNOSIS — F332 Major depressive disorder, recurrent severe without psychotic features: Secondary | ICD-10-CM

## 2020-12-09 DIAGNOSIS — F32A Depression, unspecified: Secondary | ICD-10-CM | POA: Diagnosis not present

## 2020-12-09 NOTE — Progress Notes (Signed)
Virtual Visit via Video Note   I connected with Angel Rodriguez on 12/09/20 at  9:00 AM EDT by a video enabled telemedicine application and verified that I am speaking with the correct person using two identifiers.   At orientation to the IOP program, Case Manager discussed the limitations of evaluation and management by telemedicine and the availability of in person appointments. The patient expressed understanding and agreed to proceed with virtual visits throughout the duration of the program.   Location:  Patient: Patient Home Provider: [facility] [place] [facility]   History of Present Illness: MDD and GAD   Observations/Objective: Check In: Case Manager checked in with all participants to review discharge dates, insurance authorizations, work-related documents and needs from the treatment team regarding medications. Angel Rodriguez stated needs and engaged in discussion.    Initial Therapeutic Activity: Counselor facilitated a check-in with Angel Rodriguez to assess for safety, sobriety and medication compliance.  Clinician also inquired about Angel Rodriguez's current emotional ratings, as well as any significant changes in thoughts, feelings or behavior since previous check in.  Angel Rodriguez presented for session on time and was alert, oriented x5, with no evidence or self-report of SI/HI or A/V H.  Angel Rodriguez reported compliance with medication and denied use of alcohol or illicit substances.  Angel Rodriguez reported scores of 4/10 for depression, 3/10 for anxiety, and 0/10 for anger/irritability.  Angel Rodriguez denied any recent panic attacks or outbursts.  Angel Rodriguez reported one success over the last day was assisting her daughter in cleaning and organizing her room.  Angel Rodriguez stated "It made me feel really accomplished and she said she can tell a difference".  Angel Rodriguez reported that today she plans to do some laundry to stay productive, in addition to taking a walk with the dog.      Second Therapeutic Activity: Counselor engaged the group in discussion on  managing work/life balance today to improve mental health and wellness.  Counselor explained how finding balance between responsibilities at home and work place can be challenging, lead to increased stress, and this has been further complicated by recent pandemic leading to unemployment, more virtual work, and blurring of lines between home as a place of rest or work duties.  Counselor facilitated discussion on what challenges members have faced with this issue historically, as well as what, if any, issues have arisen following pandemic.  Counselor also discussed strategies for improving work/life balance while members work on their mental health during treatment.  Some of these included keeping track of time management; creating a list of priorities and scaling importance; setting realistic, measurable goals each day; establishing boundaries; taking care of health needs; and nurturing relationships at home and work for support.  Counselor inquired about areas where members feel they are excelling, as well as areas they could focus on during treatment. Intervention was effective, as evidenced by Angel Rodriguez participating in conversation on warning signs of burnout, and reporting that she has dealt with several of these recently, such as working long hours beyond what is reasonable, taking work home, working through lunchtime, getting out of shape, and neglecting responsibilities at home.  Angel Rodriguez stated "I'm learning this is unhealthy and I'm going to start saying 'No'.  I need to write down my priorities and use my skills from group to handle stress better too".    Assessment and Plan: Counselor recommends that Angel Rodriguez remain in IOP treatment to better manage mental health symptoms, ensure stability and pursue completion of treatment plan goals. Counselor recommends adherence to crisis/safety plan, taking medications  as prescribed, and following up with medical professionals if any issues arise.     Follow Up  Instructions: Counselor will send Webex link for next session. Angel Rodriguez was advised to call back or seek an in-person evaluation if the symptoms worsen or if the condition fails to improve as anticipated.     I provided 180 minutes of non-face-to-face time during this encounter.     Shade Flood, LCSW, LCAS 12/09/20

## 2020-12-10 ENCOUNTER — Other Ambulatory Visit: Payer: Self-pay

## 2020-12-10 ENCOUNTER — Other Ambulatory Visit (HOSPITAL_COMMUNITY): Payer: 59

## 2020-12-13 ENCOUNTER — Other Ambulatory Visit (HOSPITAL_COMMUNITY): Payer: 59 | Admitting: Psychiatry

## 2020-12-13 ENCOUNTER — Other Ambulatory Visit: Payer: Self-pay | Admitting: Neurology

## 2020-12-13 DIAGNOSIS — F332 Major depressive disorder, recurrent severe without psychotic features: Secondary | ICD-10-CM

## 2020-12-13 DIAGNOSIS — F411 Generalized anxiety disorder: Secondary | ICD-10-CM

## 2020-12-13 DIAGNOSIS — F32A Depression, unspecified: Secondary | ICD-10-CM | POA: Diagnosis not present

## 2020-12-13 NOTE — Patient Instructions (Signed)
D:  Patient completed MH-IOP today.  A: Discharge today.  Follow up with [name], LCSW on 12-17-20 and [facility] (return appt in November).  Encouraged support groups.  RTW on 12-20-20, without any restrictions.  R:  Patient receptive.

## 2020-12-13 NOTE — Progress Notes (Signed)
Virtual Visit via Video Note   I connected with Angel Rodriguez on 12/13/20 at  9:00 AM EDT by a video enabled telemedicine application and verified that I am speaking with the correct person using two identifiers.   At orientation to the IOP program, Case Manager discussed the limitations of evaluation and management by telemedicine and the availability of in person appointments. The patient expressed understanding and agreed to proceed with virtual visits throughout the duration of the program.   Location:  Patient: Patient Home Provider: [facility] [place] [facility]   History of Present Illness: MDD and GAD   Observations/Objective: Check In: Case Manager checked in with all participants to review discharge dates, insurance authorizations, work-related documents and needs from the treatment team regarding medications. Angel Rodriguez stated needs and engaged in discussion.    Initial Therapeutic Activity: Angel Rodriguez facilitated a check-in with Angel Rodriguez to assess for safety, sobriety and medication compliance.  Clinician also inquired about Angel Rodriguez's current emotional ratings, as well as any significant changes in thoughts, feelings or behavior since previous check in.  Angel Rodriguez presented for session on time and was alert, oriented x5, with no evidence or self-report of SI/HI or A/V H.  Angel Rodriguez reported compliance with medication and denied use of alcohol or illicit substances.  Angel Rodriguez reported scores of 2/10 for depression, 2/10 for anxiety, and 0/10 for anger/irritability.  Angel Rodriguez denied any recent panic attacks or outbursts.  Angel Rodriguez reported one success over the weekend was speaking with a friend about a job opportunity, so she will attend a promising interview this week.  Angel Rodriguez reported that she is trying to have a positive attitude and hopes she will be hired as a result.  Angel Rodriguez reported that an additional success was attending a homecoming event with family.  Angel Rodriguez denied any present struggles.        Second  Therapeutic Activity: Angel Rodriguez introduced topic of building social support network today.  Angel Rodriguez explained how this can be defined as having a having a group of healthy people in one's life you can talk to, spend time with, and get help from to improve both mental and physical health.  Angel Rodriguez noted that some barriers can make it difficult to connect with other people, including the presence of anxiety or depression, or moving to an unfamiliar area.  Group members were asked to assess the current state of their support network, and identify ways that this could be improved.  Tips were given on how to address previously noted barriers, such as strengthening social skills, using relaxation techniques to reduce anxiety, scheduling social time each week, and/or exploring social events nearby which could increase chances of meeting new supports.  Members were also encouraged to consider getting closer to people they already know through suggestions such as outreaching someone by text, email or phone call if they haven't spoken in awhile, doing something nice for a friend/family member unexpectedly, and/or inviting someone over for a game/movie/dinner night.  Interventions were effective, as evidenced by Angel Rodriguez actively participating in discussion on the subject and reporting that she defines a social support system as a group of empathetic family, friends and coworkers that can provide assistance when times get tough.  Angel Rodriguez reported that one barrier to engagement with supports has been lack of trust, as some people she knows will gossip about personal business and she worries about rejection for sharing details about her mental health.  Angel Rodriguez reported that she would make a goal to improve social network by continuing with community mental  health support groups after discharge from [facility] today, strengthen existing connections by doing nice things more often to show appreciation for supports, and improve social  skills by interrupting people less often.       Assessment and Plan: Angel Rodriguez informed case Angel Rodriguez and Angel Rodriguez that she is ready to end group treatment and discharged successfully following todays session.  Angel Rodriguez has been recommended to continue making regular appointments with her primary Angel Rodriguez, psychiatrist, and medical providers following this transition to ensure consistent monitoring of mental health symptoms, adherence to crisis/safety plan, and medication compliance.     Follow Up Instructions: Angel Rodriguez was advised to call back or seek an in-person evaluation if the symptoms worsen or if the condition fails to improve as anticipated.     I provided 180 minutes of non-face-to-face time during this encounter.      Angel Flood, LCSW, LCAS 12/13/20

## 2020-12-13 NOTE — Progress Notes (Signed)
Virtual Visit via Video Note  I connected with Angel Rodriguez on @TODAY @ at  9:00 AM EDT by a video enabled telemedicine application and verified that I am speaking with the correct person using two identifiers.  Location: Patient: at home Provider: at office   I discussed the limitations of evaluation and management by telemedicine and the availability of in person appointments. The patient expressed understanding and agreed to proceed.  I discussed the assessment and treatment plan with the patient. The patient was provided an opportunity to ask questions and all were answered. The patient agreed with the plan and demonstrated an understanding of the instructions.   The patient was advised to call back or seek an in-person evaluation if the symptoms worsen or if the condition fails to improve as anticipated.  I provided 20 minutes of non-face-to-face time during this encounter.   Angel Rodriguez, M.Ed,CNA   Patient ID: Angel Rodriguez, female   DOB: 12-Oct-1975, 46 y.o.   MRN: [id number] As per previous CCA states:  This is a 45 yr old, single, Caucasian female who was referred per Angel Rodriguez, therapist from [place]; treatment for worsening depressive and anxiety symptoms.  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  2) Conflictual relationship with mother.  "She married a narcissistic man and she just changed.  We used to be best friends."   Pt attended all scheduled days.  States although, she's not at 100% she feels she is getting there.  "I feel that therapy was needed during this time.  Group was very helpful.  I learned that I can't control everything."  Pt states she has a job interview tomorrow that she is hoping she will get.  On a scale of 1-10 (10 being the worst); pt rates her depression at a 2 and anxiety at a 2.  Denies SI/HI or A/V hallucinations.  A:  D/C today.  F/U with Angel Irish, LCSW on 12-17-20 and  [facility] for medication mgmt (appt in November 2022).  RTW on 12-20-20; without any restrictions.  Strongly recommend pt to f/u with support groups.  R:  Patient receptive.  Angel Rodriguez, M.Ed,CNA

## 2020-12-14 ENCOUNTER — Other Ambulatory Visit: Payer: Self-pay

## 2020-12-14 ENCOUNTER — Other Ambulatory Visit (HOSPITAL_COMMUNITY): Payer: 59

## 2020-12-14 NOTE — Telephone Encounter (Signed)
Received refill request for Lacosamide.  Last OV was on 08/16/20.  Next OV is scheduled for 03/17/21 .  Last RX was written on 11/02/20 for 180 tabs.   [place] [facility] Database has been reviewed.

## 2020-12-14 NOTE — Progress Notes (Signed)
Virtual Visit via Telephone Note  I connected with EMMALENA CANNY on 12/14/20 at  9:00 AM EDT by telephone and verified that I am speaking with the correct person using two identifiers.  Location: Patient: home Provider: Office   I discussed the limitations, risks, security and privacy concerns of performing an evaluation and management service by telephone and the availability of in person appointments. I also discussed with the patient that there may be a patient responsible charge related to this service. The patient expressed understanding and agreed to proceed.   I discussed the assessment and treatment plan with the patient. The patient was provided an opportunity to ask questions and all were answered. The patient agreed with the plan and demonstrated an understanding of the instructions.   The patient was advised to call back or seek an in-person evaluation if the symptoms worsen or if the condition fails to improve as anticipated.  I provided 15 minutes of non-face-to-face time during this encounter.   Derrill Center, NP   [name] Intensive Outpatient Program Discharge Summary  Angel Rodriguez [id number]  Admission date: 11/28/2020 Discharge date: 12/13/2020  Reason for admission:  Per admission assessment note: Libby Goehring is a 45 year old Caucasian female that presents with worsening depression and anxiety.  Patient was referred by her therapist.  States she is followed by primary care provider where she is prescribed Trintellix however due to her seizure history she has not been able to take this medication.denied previous inpatient admissions.  Reports poor concentration, helplessness, symptoms of worry, mood irritability and worsening depression.  Denied previous suicide attempts.  Denied previous inpatient admissions.   Progress in Program Toward Treatment Goals: Ongoing, patient attended and participated with daily group session with active and engaged  participation.  No reported symptoms at discharge.  Medications was refilled on 12/03/2020 patient to keep follow-up with all outpatient providers.  Progress (rationale): Follow up with [name] , on 10/14/222 LCSW and the [facility] for medication management.   Take all medications as prescribed. Keep all follow-up appointments as scheduled.  Do not consume alcohol or use illegal drugs while on prescription medications. Report any adverse effects from your medications to your primary care provider promptly.  In the event of recurrent symptoms or worsening symptoms, call 911, a crisis hotline, or go to the nearest emergency department for evaluation.    Derrill Center, NP 12/14/2020

## 2020-12-15 ENCOUNTER — Ambulatory Visit (HOSPITAL_COMMUNITY): Payer: 59

## 2020-12-16 ENCOUNTER — Other Ambulatory Visit: Payer: Self-pay | Admitting: Neurology

## 2020-12-16 ENCOUNTER — Ambulatory Visit (HOSPITAL_COMMUNITY): Payer: 59

## 2020-12-17 ENCOUNTER — Ambulatory Visit (INDEPENDENT_AMBULATORY_CARE_PROVIDER_SITE_OTHER): Payer: 59 | Admitting: Psychology

## 2020-12-17 DIAGNOSIS — F321 Major depressive disorder, single episode, moderate: Secondary | ICD-10-CM

## 2020-12-20 ENCOUNTER — Ambulatory Visit: Payer: PRIVATE HEALTH INSURANCE | Admitting: Internal Medicine

## 2020-12-22 ENCOUNTER — Ambulatory Visit: Payer: 59 | Admitting: Dietician

## 2020-12-29 ENCOUNTER — Other Ambulatory Visit: Payer: Self-pay | Admitting: Physician Assistant

## 2020-12-29 DIAGNOSIS — E119 Type 2 diabetes mellitus without complications: Secondary | ICD-10-CM

## 2021-01-03 ENCOUNTER — Other Ambulatory Visit: Payer: 59

## 2021-01-04 ENCOUNTER — Other Ambulatory Visit: Payer: 59

## 2021-01-05 ENCOUNTER — Ambulatory Visit (INDEPENDENT_AMBULATORY_CARE_PROVIDER_SITE_OTHER): Payer: 59

## 2021-01-05 ENCOUNTER — Other Ambulatory Visit (INDEPENDENT_AMBULATORY_CARE_PROVIDER_SITE_OTHER): Payer: 59

## 2021-01-05 ENCOUNTER — Other Ambulatory Visit: Payer: Self-pay

## 2021-01-05 DIAGNOSIS — E782 Mixed hyperlipidemia: Secondary | ICD-10-CM | POA: Diagnosis not present

## 2021-01-05 DIAGNOSIS — R0609 Other forms of dyspnea: Secondary | ICD-10-CM

## 2021-01-05 DIAGNOSIS — I493 Ventricular premature depolarization: Secondary | ICD-10-CM

## 2021-01-05 LAB — ECHOCARDIOGRAM COMPLETE
AV Mean grad: 6 mmHg
AV Peak grad: 10.4 mmHg
AV Vena cont: 0.2 cm
Ao pk vel: 1.61 m/s
Area-P 1/2: 3.76 cm2
Calc EF: 62.1 %
S' Lateral: 2.7 cm
Single Plane A2C EF: 64.8 %
Single Plane A4C EF: 58.1 %

## 2021-01-06 LAB — LIPID PANEL
Chol/HDL Ratio: 3.3 ratio (ref 0.0–4.4)
Cholesterol, Total: 174 mg/dL (ref 100–199)
HDL: 52 mg/dL (ref >39)
LDL Chol Calc (NIH): 88 mg/dL (ref 0–99)
Triglycerides: 205 mg/dL — ABNORMAL HIGH (ref 0–149)
VLDL Cholesterol Cal: 34 mg/dL (ref 5–40)

## 2021-01-10 ENCOUNTER — Other Ambulatory Visit: Payer: Self-pay

## 2021-01-10 ENCOUNTER — Ambulatory Visit (INDEPENDENT_AMBULATORY_CARE_PROVIDER_SITE_OTHER): Payer: 59 | Admitting: Cardiology

## 2021-01-10 ENCOUNTER — Encounter: Payer: Self-pay | Admitting: Cardiology

## 2021-01-10 VITALS — BP 122/82 | HR 55 | Ht 60.0 in | Wt 182.0 lb

## 2021-01-10 DIAGNOSIS — R0609 Other forms of dyspnea: Secondary | ICD-10-CM | POA: Diagnosis not present

## 2021-01-10 DIAGNOSIS — E782 Mixed hyperlipidemia: Secondary | ICD-10-CM

## 2021-01-10 DIAGNOSIS — Z6835 Body mass index (BMI) 35.0-35.9, adult: Secondary | ICD-10-CM

## 2021-01-10 NOTE — Patient Instructions (Signed)
Medication Instructions:  Your physician recommends that you continue on your current medications as directed. Please refer to the Current Medication list given to you today.  *If you need a refill on your cardiac medications before your next appointment, please call your pharmacy*   Lab Work:  Your physician recommends that you return for a FASTING lipid profile: The week prior to your 6 month follow up.  - You will need to be fasting. Please do not have anything to eat or drink after midnight the morning you have the lab work. You may only have water or black coffee with no cream or sugar.   We will call you closer to your appointment time and schedule you in our office for this lab draw.    Testing/Procedures: None ordered   Follow-Up: At [facility], you and your health needs are our priority.  As part of our continuing mission to provide you with exceptional heart care, we have created designated Provider Care Teams.  These Care Teams include your primary Cardiologist (physician) and Advanced Practice Providers (APPs -  Physician Assistants and Nurse Practitioners) who all work together to provide you with the care you need, when you need it.  We recommend signing up for the patient portal called "MyChart".  Sign up information is provided on this After Visit Summary.  MyChart is used to connect with patients for Virtual Visits (Telemedicine).  Patients are able to view lab/test results, encounter notes, upcoming appointments, etc.  Non-urgent messages can be sent to your provider as well.   To learn more about what you can do with MyChart, go to [email or web address].    Your next appointment:   6 month(s)  The format for your next appointment:   In Person  Provider:   You may see Dr. Garen Lah or one of the following Advanced Practice Providers on your designated Care Team:   Murray Hodgkins, NP Christell Faith, PA-C Cadence Kathlen Mody, [place]    Other Instructions

## 2021-01-10 NOTE — Progress Notes (Signed)
Cardiology Office Note:    Date:  01/10/2021   ID:  Angel Rodriguez, DOB [birth year], MRN [id number]  PCP:  Angel Rodriguez   [facility] Providers Cardiologist:  None     Referring MD: Angel Rodriguez   Chief Complaint  Patient presents with   OTher    6 week follow up. Meds reviewed verbally with patient.     History of Present Illness:    Angel Rodriguez is a 45 y.o. 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    Due to PVCs.  Patient underwent sleep study due to history of snoring and excessive daytime sleepiness.  Frequent PVCs were noted during sleep study.  Previously seen for dyspnea on exertion and elevated cholesterol levels.  Started on Lipitor 40 mg daily, patient is tolerating Lipitor without any adverse effects.  Echo and Posey ordered to evaluate cardiac function disease.  Presents for follow-up echocardiogram results.  Prior notes  Mother had CAD in her 16s, underwent three-vessel CABG.   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 She takes verapamil due to migraines.   Past Medical History:  Diagnosis Date   Anxiety    Blind right eye 2009   swelling & pressure   Cluster headaches    started after seizure and migraines   Complication of anesthesia    trouble with short term memory after surgeries   COVID-19    Depression    Fibroids    s/p TLH, bilateral salpingectomy   GERD (gastroesophageal reflux disease)    patient thinks due to medications   History of anemia    prior to hysterectomy   History of pneumonia    History of trichomoniasis    Seizures ([place]) x 1 2-3 yrs ago none since   once saw neurologist, full body brain MRI and another 6 months lster   Status post right oophorectomy    Thrombocytopenia ([place])    sees [place] with Heme Onc    Past Surgical History:  Procedure  Laterality Date   ABDOMINAL HYSTERECTOMY     CYSTOSCOPY N/A 07/31/2017   Procedure: CYSTOSCOPY;  Surgeon: Megan Salon, MD;  Location: [facility];  Service: Gynecology;  Laterality: N/A;   ENDOMETRIAL ABLATION     EYE SURGERY     laser - removed blood from right eye   HYSTEROSCOPY WITH D & C     LAPAROSCOPY N/A 09/19/2017   Procedure: LAPAROSCOPY DIAGNOSTIC WITH RIGHT OOPHERECTOMY, LYSIS OF ADHESIONS;  Surgeon: Salvadore Dom, MD;  Location: [facility];  Service: Gynecology;  Laterality: N/A;   OMENTECTOMY N/A 11/21/2017   Procedure: OMENTECTOMY AND PELVIC WASHINGS;  Surgeon: Isabel Caprice, MD;  Location: [facility];  Service: Gynecology;  Laterality: N/A;   ROBOTIC ASSISTED SALPINGO OOPHERECTOMY Left 11/21/2017   Procedure: XI ROBOTIC ASSISTED LEFT SALPINGO OOPHORECTOMY;  Surgeon: Isabel Caprice, MD;  Location: [facility];  Service: Gynecology;  Laterality: Left;   TOTAL LAPAROSCOPIC HYSTERECTOMY WITH SALPINGECTOMY Bilateral 07/31/2017   Procedure: TOTAL LAPAROSCOPIC HYSTERECTOMY WITH SALPINGECTOMY;  Surgeon: Megan Salon, MD;  Location: [facility];  Service: Gynecology;  Laterality: Bilateral;  20 week size uterus/ Alexis bag in room/ need 4.5 hours   TUBAL LIGATION     WISDOM TOOTH EXTRACTION      Current Medications: Current Meds  Medication Sig   Accu-Chek Softclix Lancets  lancets Use as instructed   Acetaminophen-Codeine 300-30 MG tablet ONE TO TWO AS NEEDED FOR HEADACHE. NO MORE THAN 4/DAY   Ascorbic Acid (VITAMIN C) 1000 MG tablet Take 1,000 mg by mouth daily.   atorvastatin (LIPITOR) 40 MG tablet Take 1 tablet (40 mg total) by mouth daily.   atropine 1 % ophthalmic solution Place 1 drop into the right eye 3 (three) times daily.    blood glucose meter kit and supplies Dispense based on patient and insurance preference. Use to check fasting blood sugar and 2 hrs after largest meal. (FOR ICD-10 E10.9, E11.9).   brimonidine (ALPHAGAN P) 0.1 % SOLN Place 1 drop into the right eye 3  (three) times daily.    calcium carbonate (TUMS EX) 750 MG chewable tablet Chew 2 tablets by mouth daily as needed for heartburn.    clonazePAM (KLONOPIN) 1 MG tablet Take 1 tablet (1 mg total) by mouth 2 (two) times daily as needed for anxiety.   dorzolamide-timolol (COSOPT) 22.3-6.8 MG/ML ophthalmic solution Place 1 drop into the right eye 2 (two) times daily.    fluticasone (FLONASE) 50 MCG/ACT nasal spray Place 1-2 sprays into both nostrils daily as needed for allergies.    glucose blood (ACCU-CHEK GUIDE) test strip Use to check fasting glucose and 2 hours after largest meal   Lacosamide 100 MG TABS TAKE 1 TABLET BY MOUTH TWICE A DAY   latanoprost (XALATAN) 0.005 % ophthalmic solution Place 1 drop into both eyes at bedtime.   metFORMIN (GLUCOPHAGE) 500 MG tablet TAKE 1 TABLET BY MOUTH TWICE DAILY WITH A MEAL FOR 7 DAYS AND THEN TAKE 2 TABLETS BY MOUTH TWICE DAILY WITH A MEAL   nystatin cream (MYCOSTATIN) Apply 1 application topically 2 (two) times daily. Apply to affected area BID for up to 7 days.   SUMAtriptan (IMITREX) 100 MG tablet Take 1 tablet (100 mg total) by mouth once as needed for migraine. May repeat in 2 hours if headache persists or recurs.   verapamil (CALAN-SR) 240 MG CR tablet TAKE 1 TABLET BY MOUTH AT BEDTIME.   Vitamin D, Ergocalciferol, (DRISDOL) 1.25 MG (50000 UNIT) CAPS capsule Take 1 capsule (50,000 Units total) by mouth every 7 (seven) days.   vortioxetine HBr (TRINTELLIX) 10 MG TABS tablet Take 1 tablet (10 mg total) by mouth daily.     Allergies:   Ivp dye [iodinated diagnostic agents] and Bee venom   Social History   Socioeconomic History   Marital status: Single    Spouse name: Not on file   Number of children: 3   Years of education: Not on file   Highest education level: Associate degree: occupational, Hotel manager, or vocational program  Occupational History   Not on file  Tobacco Use   Smoking status: Former    Years: 20.00    Types: Cigarettes,  E-cigarettes    Quit date: 05/05/2018    Years since quitting: 2.6   Smokeless tobacco: Never  Vaping Use   Vaping Use: Former  Substance and Sexual Activity   Alcohol use: Not Currently   Drug use: No   Sexual activity: Yes    Birth control/protection: Surgical    Comment: hysterectomy  Other Topics Concern   Not on file  Social History Narrative   Not on file   Social Determinants of Health   Financial Resource Strain: Not on file  Food Insecurity: Not on file  Transportation Needs: Not on file  Physical Activity: Not on file  Stress: Not on  file  Social Connections: Not on file     Family History: The patient's family history includes Alcohol abuse in her father; Alcoholism in her father; Cancer in her maternal grandmother; Cirrhosis in her father; Drug abuse in her cousin; Heart disease in her maternal aunt.  ROS:   Please see the history of present illness.     All other systems reviewed and are negative.  EKGs/Labs/Other Studies Reviewed:    The following studies were reviewed today:   EKG:  EKG not  ordered today.    Recent Labs: 07/09/2020: TSH 1.730 08/16/2020: Hemoglobin 13.9; Platelets 71 09/02/2020: ALT 95; BUN 9; Creatinine, Ser 0.71; Potassium 3.9; Sodium 141  Recent Lipid Panel    Component Value Date/Time   CHOL 174 01/05/2021 0946   TRIG 205 (H) 01/05/2021 0946   HDL 52 01/05/2021 0946   CHOLHDL 3.3 01/05/2021 0946   CHOLHDL 5.9 03/14/2018 0316   VLDL 58 (H) 03/14/2018 0316   LDLCALC 88 01/05/2021 0946     Risk Assessment/Calculations:          Physical Exam:    VS:  BP 122/82 (BP Location: Right Arm, Patient Position: Sitting, Cuff Size: Normal)   Pulse (!) 55   Ht 5' (1.524 m)   Wt 182 lb (82.6 kg)   LMP 05/02/2017   SpO2 98%   BMI 35.54 kg/m     Wt Readings from Last 3 Encounters:  01/10/21 182 lb (82.6 kg)  11/29/20 177 lb (80.3 kg)  10/21/20 186 lb 3.2 oz (84.5 kg)     GEN:  Well nourished, well developed in no acute  distress HEENT: Normal NECK: No JVD; No carotid bruits LYMPHATICS: No lymphadenopathy CARDIAC: RRR, no murmurs, rubs, gallops RESPIRATORY:  Clear to auscultation without rales, wheezing or rhonchi  ABDOMEN: Soft, non-tender, non-distended MUSCULOSKELETAL:  No edema; No deformity  SKIN: Warm and dry NEUROLOGIC:  Alert and oriented x 3 PSYCHIATRIC:  Normal affect   ASSESSMENT:    1. Mixed hyperlipidemia   2. Dyspnea on exertion   3. BMI 35.0-35.9,adult     PLAN:    In order of problems listed above:  Mixed hyperlipidemia, LDL over 190.  Started on Lipitor with good effect.  Cholesterol much improved, triglycerides slightly elevated.  Continue Lipitor 40 mg daily. Dyspnea on exertion, nonspecific chest pain.  Echocardiogram showed normal systolic function, grade 2 diastolic dysfunction.  Lexiscan Myoview with no significant ischemia, low risk study.  Diastolic dysfunction likely from obesity. Obesity, low-calorie diet, weight loss advised.  Follow-up in 6 months after repeat fasting lipid profile.    Medication Adjustments/Labs and Tests Ordered: Current medicines are reviewed at length with the patient today.  Concerns regarding medicines are outlined above.  No orders of the defined types were placed in this encounter.   No orders of the defined types were placed in this encounter.    Patient Instructions  Medication Instructions:  Your physician recommends that you continue on your current medications as directed. Please refer to the Current Medication list given to you today.  *If you need a refill on your cardiac medications before your next appointment, please call your pharmacy*   Lab Work:  Your physician recommends that you return for a FASTING lipid profile: The week prior to your 6 month follow up.  - You will need to be fasting. Please do not have anything to eat or drink after midnight the morning you have the lab work. You may only have water or black  coffee with no cream or sugar.   We will call you closer to your appointment time and schedule you in our office for this lab draw.    Testing/Procedures: None ordered   Follow-Up: At [facility], you and your health needs are our priority.  As part of our continuing mission to provide you with exceptional heart care, we have created designated Provider Care Teams.  These Care Teams include your primary Cardiologist (physician) and Advanced Practice Providers (APPs -  Physician Assistants and Nurse Practitioners) who all work together to provide you with the care you need, when you need it.  We recommend signing up for the patient portal called "MyChart".  Sign up information is provided on this After Visit Summary.  MyChart is used to connect with patients for Virtual Visits (Telemedicine).  Patients are able to view lab/test results, encounter notes, upcoming appointments, etc.  Non-urgent messages can be sent to your provider as well.   To learn more about what you can do with MyChart, go to [email or web address].    Your next appointment:   6 month(s)  The format for your next appointment:   In Person  Provider:   You may see Dr. Garen Lah or one of the following Advanced Practice Providers on your designated Care Team:   Murray Hodgkins, NP Christell Faith, Rodriguez Cadence Kathlen Mody, [place]    Other Instructions    Signed, Kate Sable, MD  01/10/2021 5:15 PM    [place]

## 2021-01-11 ENCOUNTER — Ambulatory Visit: Payer: 59 | Admitting: Genetic Counselor

## 2021-01-19 NOTE — Progress Notes (Signed)
Pre-test Genetic Consult notes   Referring Provider: Kate Sable, MD   Referral Reason Angel Rodriguez was referred for genetic consult and testing of familial hypercholesterolemia (FH).  [place] Woods (III.1 on pedigree) is a pleasant 45-year African American lady who works at accounts payable with [employer]. She had a blood test at age 45 that detected hyperlipidemia with LDL-C of >200. She states that she has been on Lipitor and had made lifestyle changes that includes walking 1-2 miles twice a week. Since then her LDL-C levels have normalized with the lipid panel of 01/05/21 detected normal LDL-C levels.  Risk Factors Bralynn denies having hypothyroidism, renal or hepatic dysfunction that can also lead to elevated LDL-C. She tells me that she began smoking around age [age] and has stopped smoking for the last two years. She was diagnosed with diabetes about 4-5 years ago and tells me that her diet is not the best.  Family history Neville (III.1) has 2 sons ages 64 and 88 (IV.1-IV.2) and a 58 y.o. daughter (IV.3). She is unsure if her younger sister, age 71 (III.2) had a lipid panel test.   Her father (II.5) was a [place] War veteran who succumbed to alcohol abuse at age 49. She does not know much about her other paternal relatives.  Manami's mother (II.6) underwent a 3-vessel CABG at age 60. Her younger sister (II.7) died of stroke at age 7 after undergoing kidney dialysis for about 20 years. No history of heart disease in her maternal grandparents (I.3-I.4)  Genetic Consultation Notes  I explained to Carrie that the characteristic features of a genetic condition include absence of risk factors that can lead to elevated LDL-C, early age of presentation, increased disease severity and family history of the condition. The clinical manifestations of FH were also reviewed. She denies having xanthomas, corneal arcus or a heart attack.  I discussed the  molecular pathogenesis of FH. I informed her that [place] is primarily caused by pathogenic variants in three genes, namely APOB, LDLR and PCSK9. These pathogenic variants impact LDLR synthesis, degradation and recycling in cells and results in elevated LDL-C levels. We then walked through autosomal dominant inheritance pattern and viewed pedigree of families with heterozygous FH (HeFH) and homozygous FH (HoFH). Explained to her that digenic or compound heterozygous mutations in APOB, LDLR and PCSK9 genes can cause HoFH.   We reviewed the likely outcomes of FH genetic testing. Based on the diagnostic criteria for FH, yields can range from 50%-90%. A positive yield is observed in  ~63% of patients with a definite clinical diagnosis of FH. A negative test does not exclude a genetic basis for FH. In some cases, polygenic inheritance, where more than an average number of common variants, each with small effect, are known to increase plasma lipid levels. Additionally, limitations in current genetic testing methodology can produce a negative result. Variants of unknown significance (VUS) can be seen in some cases. I explained that typically a VUS is so classified if the variant is not well understood as very few individuals have been reported to harbor this variant or its role in gene function has not been elucidated. Screening other first-degree family members by genetic testing was also discussed. Additionally, we briefly touched upon the molecular basis of the different treatment modalities that are currently available.  Impression  In summary, Arantza had elevated LDL-C of >200 that has subsequently normalized with statins. In addition, she has family history of CAD in her mother. It is highly  likely that she may have inherited the condition from her mother or has polygenic inheritance for hyperlipidemia as her LDL-C numbers have now normalized. Genetic testing is recommended to identify the pathogenic variant in the  event that she inherited this condition. Genetic testing should evaluate the major genes implicated in familial hypercholesterolemia.    In addition, we discussed the protections afforded by the Genetic Information Non-Discrimination Act (GINA). I explained to her that GINA protects her from losing her employment or health insurance based on her genotype. However, these protections do not cover life insurance and disability. She verbalized understanding of this and states that her younger two children are covered under her employer's life insurance plan.  Please note that the patient has not been counseled in this visit on personal, cultural or ethical issues that she may face due to her heart condition.   Plans After a thorough discussion of the risk and benefits of genetic testing for FH, Teyana states her intent to pursue genetic testing for FH and signed the informed consent form. Blood was drawn today for testing.    Lattie Corns, Ph.D, [facility] Clinical Molecular Geneticist

## 2021-01-21 ENCOUNTER — Ambulatory Visit: Payer: 59 | Admitting: Psychology

## 2021-01-28 ENCOUNTER — Other Ambulatory Visit: Payer: Self-pay | Admitting: Neurology

## 2021-01-31 ENCOUNTER — Ambulatory Visit (INDEPENDENT_AMBULATORY_CARE_PROVIDER_SITE_OTHER): Payer: Self-pay | Admitting: Psychology

## 2021-01-31 DIAGNOSIS — F321 Major depressive disorder, single episode, moderate: Secondary | ICD-10-CM

## 2021-02-11 ENCOUNTER — Ambulatory Visit (INDEPENDENT_AMBULATORY_CARE_PROVIDER_SITE_OTHER): Payer: 59 | Admitting: Psychology

## 2021-02-11 DIAGNOSIS — F321 Major depressive disorder, single episode, moderate: Secondary | ICD-10-CM
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
## 2021-02-14 ENCOUNTER — Other Ambulatory Visit: Payer: Self-pay | Admitting: Physician Assistant

## 2021-02-14 DIAGNOSIS — Z0000 Encounter for general adult medical examination without abnormal findings: Secondary | ICD-10-CM

## 2021-02-14 DIAGNOSIS — E119 Type 2 diabetes mellitus without complications: Secondary | ICD-10-CM

## 2021-02-14 DIAGNOSIS — E785 Hyperlipidemia, unspecified: Secondary | ICD-10-CM

## 2021-02-14 DIAGNOSIS — R748 Abnormal levels of other serum enzymes: Secondary | ICD-10-CM

## 2021-02-15 ENCOUNTER — Encounter: Payer: 59 | Admitting: Genetic Counselor

## 2021-02-17 ENCOUNTER — Other Ambulatory Visit: Payer: Self-pay

## 2021-02-17 ENCOUNTER — Other Ambulatory Visit: Payer: 59

## 2021-02-17 DIAGNOSIS — E119 Type 2 diabetes mellitus without complications: Secondary | ICD-10-CM

## 2021-02-17 DIAGNOSIS — Z0000 Encounter for general adult medical examination without abnormal findings: Secondary | ICD-10-CM

## 2021-02-17 DIAGNOSIS — R748 Abnormal levels of other serum enzymes: Secondary | ICD-10-CM

## 2021-02-17 DIAGNOSIS — E785 Hyperlipidemia, unspecified: Secondary | ICD-10-CM

## 2021-02-18 LAB — COMPREHENSIVE METABOLIC PANEL
ALT: 36 IU/L — ABNORMAL HIGH (ref 0–32)
AST: 22 IU/L (ref 0–40)
Albumin/Globulin Ratio: 1.8 (ref 1.2–2.2)
Albumin: 4.7 g/dL (ref 3.8–4.8)
Alkaline Phosphatase: 78 IU/L (ref 44–121)
BUN/Creatinine Ratio: 16 (ref 9–23)
BUN: 10 mg/dL (ref 6–24)
Bilirubin Total: 0.3 mg/dL (ref 0.0–1.2)
CO2: 21 mmol/L (ref 20–29)
Calcium: 9.6 mg/dL (ref 8.7–10.2)
Chloride: 107 mmol/L — ABNORMAL HIGH (ref 96–106)
Creatinine, Ser: 0.64 mg/dL (ref 0.57–1.00)
Globulin, Total: 2.6 g/dL (ref 1.5–4.5)
Glucose: 110 mg/dL — ABNORMAL HIGH (ref 70–99)
Potassium: 4 mmol/L (ref 3.5–5.2)
Sodium: 142 mmol/L (ref 134–144)
Total Protein: 7.3 g/dL (ref 6.0–8.5)
eGFR: 110 mL/min/{1.73_m2} (ref >59)

## 2021-02-18 LAB — CBC
Hematocrit: 42.5 % (ref 34.0–46.6)
Hemoglobin: 14.4 g/dL (ref 11.1–15.9)
MCH: 29.6 pg (ref 26.6–33.0)
MCHC: 33.9 g/dL (ref 31.5–35.7)
MCV: 87 fL (ref 79–97)
Platelets: 65 10*3/uL — CL (ref 150–450)
RBC: 4.86 x10E6/uL (ref 3.77–5.28)
RDW: 12.4 % (ref 11.7–15.4)
WBC: 8.1 10*3/uL (ref 3.4–10.8)

## 2021-02-18 LAB — LIPID PANEL
Chol/HDL Ratio: 3.6 ratio (ref 0.0–4.4)
Cholesterol, Total: 171 mg/dL (ref 100–199)
HDL: 47 mg/dL (ref >39)
LDL Chol Calc (NIH): 85 mg/dL (ref 0–99)
Triglycerides: 236 mg/dL — ABNORMAL HIGH (ref 0–149)
VLDL Cholesterol Cal: 39 mg/dL (ref 5–40)

## 2021-02-18 LAB — HEMOGLOBIN A1C
Est. average glucose Bld gHb Est-mCnc: 126 mg/dL
Hgb A1c MFr Bld: 6 % — ABNORMAL HIGH (ref 4.8–5.6)

## 2021-02-18 LAB — TSH: TSH: 1.03 u[IU]/mL (ref 0.450–4.500)

## 2021-02-21 ENCOUNTER — Ambulatory Visit: Payer: PRIVATE HEALTH INSURANCE | Admitting: Physician Assistant

## 2021-02-21 ENCOUNTER — Encounter: Payer: 59 | Admitting: Psychology

## 2021-02-21 NOTE — Progress Notes (Signed)
This encounter was created in error - please disregard.

## 2021-02-24 ENCOUNTER — Telehealth: Payer: Self-pay | Admitting: Neurology

## 2021-02-24 NOTE — Telephone Encounter (Signed)
Called pt back. Relayed Dr. Georgina Peer recommendation. She verbalized understanding and agreeable to plan. She will try this.

## 2021-02-24 NOTE — Telephone Encounter (Signed)
Called patient back. Experiencing pressure in head/right eye. Typically given injections/sphenocath at visits w/ Dr. Felecia Shelling. Has not taken over the counter medication. States she is not able to take anything without MD permission. Aware Dr. Felecia Shelling out. Will have to send to covering MD to review her chart, see if there are any recommendations.  Confirmed she did see Cardiology. Started on lipitor 40mg  qd around 11/29/2020. Had labs rechecked in November and lipids improved. She never heard about getting appt w/ pulmonology. She forgot about this. Reminded her we placed this referral d/t "PSG showed nocturnal hypoxemia.  Please evaluate and set up O2 if needed". Pt verbalized understanding. Made her aware that this may be part of cause for headaches. I will have referrals resend this and she will be on look out for call to get this scheduled.  She will reach out to [facility] about getting refill for atropine. This is who was prescribing it for her.

## 2021-02-24 NOTE — Telephone Encounter (Signed)
She can take Tylenol and see if that helps. If still no improvement she can take her Imitrex.

## 2021-02-24 NOTE — Telephone Encounter (Signed)
Pt called states she is having a lot of pressure in her head and eye area, she asking if doctor can prescribe her something to hold her until her appt with Dr. Felecia Shelling.[name]

## 2021-02-25 ENCOUNTER — Other Ambulatory Visit (HOSPITAL_BASED_OUTPATIENT_CLINIC_OR_DEPARTMENT_OTHER): Payer: Self-pay | Admitting: Obstetrics & Gynecology

## 2021-02-25 MED ORDER — FLUCONAZOLE 150 MG PO TABS: 150.0000 mg | ORAL_TABLET | Freq: Once | ORAL | 0 refills | Status: AC

## 2021-02-25 MED ORDER — SULFAMETHOXAZOLE-TRIMETHOPRIM 800-160 MG PO TABS: 1.0000 | ORAL_TABLET | Freq: Two times a day (BID) | ORAL | 0 refills | Status: DC

## 2021-02-25 NOTE — Progress Notes (Signed)
Pt called via on call service reporting increased urgency with pressure.  Reports this has been present for about two days and it's gotten worse during the day.  Denies blood in urine.  Denies fever.  Rx for bactrim DS BID x 3 days and fluconazole 150mg  po x 1 called to pharmacy--[facility] on [address].

## 2021-03-01 NOTE — Telephone Encounter (Signed)
New referral has been placed to [facility].

## 2021-03-09 ENCOUNTER — Other Ambulatory Visit (HOSPITAL_BASED_OUTPATIENT_CLINIC_OR_DEPARTMENT_OTHER): Payer: Self-pay | Admitting: Obstetrics & Gynecology

## 2021-03-09 ENCOUNTER — Other Ambulatory Visit: Payer: Self-pay | Admitting: Physician Assistant

## 2021-03-09 ENCOUNTER — Telehealth: Payer: Self-pay | Admitting: Neurology

## 2021-03-09 DIAGNOSIS — F419 Anxiety disorder, unspecified: Secondary | ICD-10-CM

## 2021-03-09 DIAGNOSIS — E559 Vitamin D deficiency, unspecified: Secondary | ICD-10-CM

## 2021-03-09 MED ORDER — LACOSAMIDE 100 MG PO TABS: 1.0000 | ORAL_TABLET | Freq: Two times a day (BID) | ORAL | 0 refills | Status: DC

## 2021-03-09 MED ORDER — VITAMIN D (ERGOCALCIFEROL) 1.25 MG (50000 UNIT) PO CAPS: 50000.0000 [IU] | ORAL_CAPSULE | ORAL | 0 refills | Status: DC

## 2021-03-09 NOTE — Telephone Encounter (Signed)
Dr Felecia Shelling does not feel comfortable with prescribing her eye drops. This would be best served by her eye md. Patient was made aware of this when she came to pick up her samples

## 2021-03-09 NOTE — Telephone Encounter (Signed)
Called the Angel Rodriguez to advise that I have enough of the samples of vimpart to hold her until her insurance goes into effect 04/09/2021. Angel Rodriguez asked about having atropine sent in for [facility] to [facility] because health dept got her a coupon. I do not see where we have actively prescribed any of her eye drops. She had previously been getting them from [facility] and my understanding per the 12/22 conversation she needed to touch base with them.  Angel Rodriguez states she had discuss previously with Dr Felecia Shelling about him taking them on because they are for swelling and because she is treated here there wouldn't be a need to continue to go to [facility]. I do not see  this documented anywhere. Advised I would ask Dr Felecia Shelling and see what he says

## 2021-03-09 NOTE — Telephone Encounter (Signed)
Pt called states she is out of seizure medication, wanting to know is there a cheaper alternative for the month until her insurance goes into effect. Pt requesting a call back.

## 2021-03-14 ENCOUNTER — Encounter: Payer: 59 | Admitting: Psychology

## 2021-03-14 NOTE — Progress Notes (Signed)
This encounter was created in error - please disregard.

## 2021-03-17 ENCOUNTER — Encounter: Payer: Self-pay | Admitting: Neurology

## 2021-03-17 ENCOUNTER — Ambulatory Visit (INDEPENDENT_AMBULATORY_CARE_PROVIDER_SITE_OTHER): Payer: Self-pay | Admitting: Neurology

## 2021-03-17 ENCOUNTER — Ambulatory Visit: Payer: 59 | Admitting: Neurology

## 2021-03-17 VITALS — BP 160/78 | HR 71 | Ht 60.0 in | Wt 184.8 lb

## 2021-03-17 DIAGNOSIS — R930 Abnormal findings on diagnostic imaging of skull and head, not elsewhere classified: Secondary | ICD-10-CM

## 2021-03-17 DIAGNOSIS — G4734 Idiopathic sleep related nonobstructive alveolar hypoventilation: Secondary | ICD-10-CM

## 2021-03-17 DIAGNOSIS — M542 Cervicalgia: Secondary | ICD-10-CM

## 2021-03-17 DIAGNOSIS — G43711 Chronic migraine without aura, intractable, with status migrainosus: Secondary | ICD-10-CM

## 2021-03-17 DIAGNOSIS — F419 Anxiety disorder, unspecified: Secondary | ICD-10-CM

## 2021-03-17 DIAGNOSIS — Z79899 Other long term (current) drug therapy: Secondary | ICD-10-CM

## 2021-03-17 DIAGNOSIS — I776 Arteritis, unspecified: Secondary | ICD-10-CM

## 2021-03-17 DIAGNOSIS — G441 Vascular headache, not elsewhere classified: Secondary | ICD-10-CM

## 2021-03-17 MED ORDER — CLONAZEPAM 1 MG PO TABS: 1.0000 mg | ORAL_TABLET | Freq: Two times a day (BID) | ORAL | 1 refills | Status: DC | PRN

## 2021-03-17 MED ORDER — ATROPINE SULFATE 1 % OP SOLN
1.0000 [drp] | Freq: Three times a day (TID) | OPHTHALMIC | 0 refills | Status: DC
Start: 2021-03-17 — End: 2021-05-24

## 2021-03-17 NOTE — Progress Notes (Signed)
[facility]  PATIENT: Angel Rodriguez DOB: January 15, 1976  REFERRING DOCTOR OR PCP:   SOURCE: patient, ED records, images on PACS, reports in EMR  _________________________________   HISTORICAL  CHIEF COMPLAINT:  Chief Complaint  Patient presents with   Follow-up    RM 16, alone. Last seen 08/16/20.     HISTORY OF PRESENT ILLNESS:  Angel Rodriguez is a 46 y.o. woman with frequent headaches and h/o seizure.  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  HAs are a little better.   [name]   Next infusion will be soon.  HA's are right > left sided and sometimes she has visua chanes.  .    Right eye are not red now but have been off/on the past month  She feels her headaches were doing better last year and have acted back up again over the last few months.     She denies any visual changes.   She has numbness in hr hands but no weakness.  The numbness is worse on the right.   It is in the palms more than dorsum.  Shaking her arms help.       She has no recent seizure.   Her last one was April 2021.  She is on Vimpat.    She is on Trintellix and clonazepam for anxiety and depression.   Mood was doing better last year than this year.  She has no OSA on PSG 10/20/2020   The study found nocturnal hypoxemia and frequent bigeminy/trigeminy.      She saw cardiology.   Echo showed mild diastoic dysfunction.    Placed on a statin.     PSG 10/20/2021: No significant OSA was noted 44% of the night was with SaO2 80-89%     ---- nocturnal O2 recommended. 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  She had a second opinion from [place].  Changes in her seizure medications were made but no opinion about the possibility of vasculitis.  She was placed on prednisone 60 mg and titrating down in July 2021.  She had improvement of headaches and other symptoms.  MRI 10/15/2019 showed:   "Resolution of a region of T2 and FLAIR signal within the posterior frontal cortical and subcortical brain on the study of May. One could question slight increased prominence of a T2 and FLAIR focus without enhancement in the subcortical brain at the bottom of a left frontal sulcus, axial FLAIR image 19. No other new or potentially progressive finding. Minimal small foci of cortical and subcortical enhancement in the left frontal region. I think the differential diagnosis for this case remains that of a vasculitis syndrome, venous thrombotic syndrome, autoimmune syndrome or  demyelinating syndrome. I do not think we are dealing with tumor or arterial infarctions. "  MRI brain 07/06/2018 personally reviewed and compared to studies from January 2020.  It shows 2 foci in the anterior left frontal lobe.  One was mildly increased in size compared to the January MRI and one was decreased in size.  Another focus seen on the previous MRI in the left parasagittal frontal lobe has resolved.  MRI of the cervical and thoracic spine 03/25/2018 showed normal spinal cord and mild cervical degenerative changes.  Labs: CSF 03/25/2018 showed 10 white blood cells (lymphocytes), 3 red cells, normal glucose, protein oligoclonal bands, VDRL.  In 03/2018, negative ANA, cANCA and pANCA, weakly positive ANCA proteinase 3, normal ACE level. ESR 10.  HIV was  negative 09/22/2017.  Hepatitis C was negative 04/2016  Update 06/27/2018 (virtual) She continues to experience headaches.  These are intermittent and are always right-sided worse than left side.  Most headaches are associated with nausea, photophobia and phonophobia.  Some are associated with erythema of the right eye.  Imitrex does not always help.  Tylenol 3 often helps..  She is not noting vision changes with the headaches..  She is having these migrainous headaches two days a week.   She does not have one right now.    In January she had neurologic symptoms including numbness and weakness in her right hand.  Details are in my visit notes from that day.  She feels she is better and she had complete resolution of the right-sided symptoms.  The MRI is unusual with 3 juxtacortical and subcortical enhancing lesions, 2 of which were present on MRI 02/14/2016 with 1 of them enhancing at that time.  An MRI 07/02/2015 showed the 2 previous lesions without enhancement.  She was supposed to get a second opinion in late March at [facility] but it was rescheduled due to Covid-19.  I encouraged her to follow through as I do not have a clear diagnosis for her symptoms and MRI changes.  We discussed that the numbness and clumsiness in the hands she experienced could be explained by the enhancing lesions but the etiology is unclear  She feels stressed and is anxious.   She sees psychiatry.   She is on Trintellix and prn hydroxyzine to help with anxiety.        Update 03/18/2018: She was admitted to the hospital last week after presenting with right hand numbness, weakness and clumsiness.  She called a work acquaintance during the visit to describe her symptoms.  Although she seemed dazed while the symptoms were occurring before she left to go to the hospital, she did not have any generalized tonic-clonic activity.  She was less responsive but was able to understand what was being said to her and was communicating.  Numbness  spread up over ten minutes to the entire leg and then was followed by the rest of the right side, including the face.   She had a mild headache but less severe than many of the headaches she has had in the past.      Notes from her hospital stay and multiple laboratory results and imaging studies were reviewed.  There was a concern that she might of had a seizure and she was started on Keppra.  She had imaging performed last week.  I personally reviewed the MRI images on PACS.    She has the 2 foci seen in 2017 in the left frontal subcortical white matter but the one  focus has some enhancement and is larger.  The other is unchanged.   There is a newer midline subcortical focus, also on the left that enhances.  No spinal plaques.  She also had an LP.  Cells were mildly elevated (10 WBC  89% lymphs; 11% mono) and protein was normal.   Glucose was slightly elevated at 84.  OCB is pending.  VDRL is pending.    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   In 2016, she had a seizure.  MRI showed 2 moderate size nonenhancing subcortical foci in the left frontal lobe.   A repeat MRI 02/14/2016 showed 2 juxtacortical left frontal lobe lesions.  Both were present on the 06/30/2015 MRI.  There was punctate enhancement adjacent to the more anterior frontal focus that had not been present on the previous MRI.  Diffusion weighted images were normal.  Between 2017 and 2019, she was seen multiple times for right-sided headaches.  Intranasal sphenopalatine ganglion block or up of rapid benefit.  Additionally verapamil seem to reduce the frequency.  Update 02/05/2018: She reports that headaches are back. They occur daily with migrainous features of nausea and photophobia/phonophobia.   They returned over a month ago.    She also notes that the right eye becomes red.     She feels stress worsens the headaches.     Movements worsen them.    She is  taking Imitrex with benefit but headaches return.       She is on verapamil and that seemed to help her some for a while.     Headaches occur 30/30 of the last days and for > 4 hours/day.       Last sphenocath procedure was April 2019 and it knocked headaches out completely x 1 month and then they returned mildly.     She has a h/o central retinal vein occlusion and is on drops ([facility])  She had a single seizure in March 2016 and is not on an anticonvulsant.    Insomnia is doing well on her medications.     Update 10/25/2017: Her headaches are doing better since the sphenocath procedure and the trigger point injections.    She still has some headache pain but it is not severe.    Pain sometimes builds up some as the day goes on, especially at work.     She denies any numbness, weakness or gait change.  She sees [facility] for her h/o central retinal vein occlusion.  She is still on eye drops.      She only had one seizure 3 years ago and is not on any anticonvulsant.       She has some insomnia.    She has trouble falling asleep and staying asleep.   She changed her clonazepma to 0.25 qAM and 0.75 mg qPM.  She has ovarian cancer and had surgery.   She had ovarian torsion but pathology showed possible cancer so she is going to have another operation.      Update 06/27/2017: HA's worsened again last week.    The conjunctivitis returned the next day.   All the pain is on her right side only.     In the past sphenopalatine ganglion blocks have helped the most.    She also has received some benefit form ONB/TPIs in the past.    She denies any  additional seizures.      Insomnia was doing better and was helped by clonazepam.  However, she is doing worse since the headache has recurred.  Clonazepam also helps her anxiety.   Update 04/20/2017: Headaches are doing fairly well.   She is now having 3-4 HA's a month.    Verapamil has helped reduce the frequency of headaches.     When needed,  Imitrex often helps.     She will sometimes try Tylenol first.   Due to low platelets, she can't take NSAIDs.     The TCPenia was felt to be due to fibroids and these are being treated.     She still gets redness in the right eye, not always associated with HA.    In the past when her headaches were more severe, she had the Sphenocath procedure and it was very beneficial.    Occipital nerve blocks or TPI's have also been beneficial in the past.    She has not had any more seizure.   She just had one in 2016   From 07/11/2016: HA:   Her headaches were doing much better but they are have been occurring more the past month or two. Additionally she continues to have conjunctival redness on the right.     They greatly improved after starting Verapamil and having occipital nerve block/splenius capitis muscle injection at the first visit and then [place] at the second, third and fourth visits.  After the second Sphenocath, the conjunctival redness got much better but then returned. After the fourth procedure, she went nearly pain-free for 6 months until milder headaches returned and activities slowly worsening. Pain improves  with Imitrex and rest much of the time.  She no longer can take NSAID    She tolerates the verapamil well but has occasional constipation .     Studies:   MRI of the brain showed a single small T2/FLAIR hyperintense focus in the left frontal subcortical white matter. The MR venogram was normal. ESR and ANA were normal.    Mood:  Anxiety is much better.   Clonazepam with Buspar has helped better than the escitalopram.    Seizure:   In March 2016, she had a seizure.   She had an aura of numbness in her right hand and then passed out.   She is not sure there was any generalized tonic-clonic activity. Her children did not tell her that she had any. However, she did have urinary incontinence. She does not remember much that day.    Since then, she has not had any more seizures.  She has not  needed any AED as she had only the one seizure/spell.       Insomnia:   She is sleeping better since starting clonazepam and having much less headache  Thrombocytopenia:   Her platelet count was reduced to 78,000 and she saw her doctor in hematology. Repeat platelet count was 83,000. She was advised to stop anti-inflammatories.  REVIEW OF SYSTEMS: Constitutional: No fevers, chills, sweats, or change in appetite.  She has insomnia. Eyes: as above Ear, nose and throat: No hearing loss, ear pain, nasal congestion, sore throat Cardiovascular: No chest pain, palpitations Respiratory:  No shortness of breath at rest or with exertion.   No wheezes GastrointestinaI: No nausea, vomiting, diarrhea, abdominal pain, fecal incontinence Genitourinary:  No dysuria, urinary retention or frequency.  No nocturia.   Possible ovarian cancer Musculoskeletal:  No neck pain, back pain Integumentary: No rash, pruritus, skin  lesions Neurological: as above Psychiatric: No depression at this time.  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 Allergic/Immunologic: No itchy/runny eyes, nasal congestion, recent allergic reactions, rashes  ALLERGIES: Allergies  Allergen Reactions   Ivp Dye [Iodinated Contrast Media] Hives, Itching and Swelling   Bee Venom Swelling    HOME MEDICATIONS:  Current Outpatient Medications:    Accu-Chek Softclix Lancets lancets, Use as instructed, Disp: 100 each, Rfl: 12   Acetaminophen-Codeine 300-30 MG tablet, ONE TO TWO AS NEEDED FOR HEADACHE. NO MORE THAN 4/DAY, Disp: 28 tablet, Rfl: 1   Ascorbic Acid (VITAMIN C) 1000 MG tablet, Take 1,000 mg by mouth daily., Disp: , Rfl:    blood glucose meter kit and supplies, Dispense based on patient and insurance preference. Use to check  fasting blood sugar and 2 hrs after largest meal. (FOR ICD-10 E10.9, E11.9)., Disp: 1 each, Rfl: 0   brimonidine (ALPHAGAN P) 0.1 % SOLN, Place 1 drop into the right eye 3 (three) times daily. , Disp: , Rfl:    calcium carbonate (TUMS EX) 750 MG chewable tablet, Chew 2 tablets by mouth daily as needed for heartburn. , Disp: , Rfl:    dorzolamide-timolol (COSOPT) 22.3-6.8 MG/ML ophthalmic solution, Place 1 drop into the right eye 2 (two) times daily. , Disp: , Rfl:    fluticasone (FLONASE) 50 MCG/ACT nasal spray, Place 1-2 sprays into both nostrils daily as needed for allergies. , Disp: , Rfl:    glucose blood (ACCU-CHEK GUIDE) test strip, Use to check fasting glucose and 2 hours after largest meal, Disp: 100 each, Rfl: 12   Lacosamide (VIMPAT) 100 MG TABS, Take 1 tablet (100 mg total) by mouth in the morning and at bedtime., Disp: 56 tablet, Rfl: 0   Lacosamide 100 MG TABS, TAKE 1 TABLET BY MOUTH TWICE A DAY, Disp: 180 tablet, Rfl: 1   latanoprost (XALATAN) 0.005 % ophthalmic solution, Place 1 drop into both eyes at bedtime., Disp: , Rfl:    metFORMIN (GLUCOPHAGE) 500 MG tablet, TAKE 1 TABLET BY MOUTH TWICE DAILY WITH A MEAL FOR 7 DAYS AND THEN TAKE 2 TABLETS BY MOUTH TWICE DAILY WITH A MEAL, Disp: 360 tablet, Rfl: 1   nystatin cream (MYCOSTATIN), Apply 1 application topically 2 (two) times daily. Apply to affected area BID for up to 7 days., Disp: 30 g, Rfl: 1   sulfamethoxazole-trimethoprim (BACTRIM DS) 800-160 MG tablet, Take 1 tablet by mouth 2 (two) times daily., Disp: 6 tablet, Rfl: 0   SUMAtriptan (IMITREX) 100 MG tablet, Take 1 tablet (100 mg total) by mouth once as needed for migraine. May repeat in 2 hours if headache persists or recurs., Disp: 18 tablet, Rfl: 3   verapamil (CALAN-SR) 240 MG CR tablet, TAKE 1 TABLET BY MOUTH AT BEDTIME., Disp: 90 tablet, Rfl: 2   Vitamin D, Ergocalciferol, (DRISDOL) 1.25 MG (50000 UNIT) CAPS capsule, Take 1 capsule (50,000 Units total) by mouth every 7  (seven) days., Disp: 12 capsule, Rfl: 0   vortioxetine HBr (TRINTELLIX) 10 MG TABS tablet, Take 1 tablet (10 mg total) by mouth daily., Disp: 90 tablet, Rfl: 0   atorvastatin (LIPITOR) 40 MG tablet, Take 1 tablet (40 mg total) by mouth daily., Disp: 30 tablet, Rfl: 5   atropine 1 % ophthalmic solution, Place 1 drop into the right eye 3 (three) times daily.,  Disp: 2 mL, Rfl: 0   clonazePAM (KLONOPIN) 1 MG tablet, Take 1 tablet (1 mg total) by mouth 2 (two) times daily as needed for anxiety., Disp: 180 tablet, Rfl: 1  PAST MEDICAL HISTORY: Past Medical History:  Diagnosis Date   Anxiety    Blind right eye 2009   swelling & pressure   Cluster headaches    started after seizure and migraines   Complication of anesthesia    trouble with short term memory after surgeries   COVID-19    Depression    Fibroids    s/p TLH, bilateral salpingectomy   GERD (gastroesophageal reflux disease)    patient thinks due to medications   History of anemia    prior to hysterectomy   History of pneumonia    History of trichomoniasis    Seizures ([place]) x 1 2-3 yrs ago none since   once saw neurologist, full body brain MRI and another 6 months lster   Status post right oophorectomy    Thrombocytopenia ([place])    sees [place] with Heme Onc    PAST SURGICAL HISTORY: Past Surgical History:  Procedure Laterality Date   ABDOMINAL HYSTERECTOMY     CYSTOSCOPY N/A 07/31/2017   Procedure: CYSTOSCOPY;  Surgeon: Megan Salon, MD;  Location: [facility];  Service: Gynecology;  Laterality: N/A;   ENDOMETRIAL ABLATION     EYE SURGERY     laser - removed blood from right eye   HYSTEROSCOPY WITH D & C     LAPAROSCOPY N/A 09/19/2017   Procedure: LAPAROSCOPY DIAGNOSTIC WITH RIGHT OOPHERECTOMY, LYSIS OF ADHESIONS;  Surgeon: Salvadore Dom, MD;  Location: [facility];  Service: Gynecology;  Laterality: N/A;   OMENTECTOMY N/A 11/21/2017   Procedure: OMENTECTOMY AND PELVIC WASHINGS;  Surgeon: Isabel Caprice, MD;  Location:  [facility];  Service: Gynecology;  Laterality: N/A;   ROBOTIC ASSISTED SALPINGO OOPHERECTOMY Left 11/21/2017   Procedure: XI ROBOTIC ASSISTED LEFT SALPINGO OOPHORECTOMY;  Surgeon: Isabel Caprice, MD;  Location: [facility];  Service: Gynecology;  Laterality: Left;   TOTAL LAPAROSCOPIC HYSTERECTOMY WITH SALPINGECTOMY Bilateral 07/31/2017   Procedure: TOTAL LAPAROSCOPIC HYSTERECTOMY WITH SALPINGECTOMY;  Surgeon: Megan Salon, MD;  Location: [facility];  Service: Gynecology;  Laterality: Bilateral;  20 week size uterus/ Alexis bag in room/ need 4.5 hours   TUBAL LIGATION     WISDOM TOOTH EXTRACTION      FAMILY HISTORY: Family History  Problem Relation Age of Onset   Alcoholism Father    Cirrhosis Father    Alcohol abuse Father    Cancer Maternal Grandmother        unsure of type; died of other causes   Heart disease Maternal Aunt    Drug abuse Cousin     SOCIAL HISTORY:  Social History   Socioeconomic History   Marital status: Single    Spouse name: Not on file   Number of children: 3   Years of education: Not on file   Highest education level: Associate degree: occupational, Hotel manager, or vocational program  Occupational History   Not on file  Tobacco Use   Smoking status: Former    Years: 20.00    Types: Cigarettes, E-cigarettes    Quit date: 05/05/2018    Years since quitting: 2.8   Smokeless tobacco: Never  Vaping Use   Vaping Use: Former  Substance and Sexual Activity   Alcohol use: Not Currently   Drug use: No   Sexual activity: Yes    Birth  control/protection: Surgical    Comment: hysterectomy  Other Topics Concern   Not on file  Social History Narrative   Not on file   Social Determinants of Health   Financial Resource Strain: Not on file  Food Insecurity: Not on file  Transportation Needs: Not on file  Physical Activity: Not on file  Stress: Not on file  Social Connections: Not on file  Intimate Partner Violence: Not on file     PHYSICAL EXAM  Vitals:    03/17/21 1122  BP: (!) 160/78  Pulse: 71  Weight: 184 lb 12.8 oz (83.8 kg)  Height: 5' (1.524 m)    Body mass index is 36.09 kg/m.   General: The patient is well-developed and well-nourished and in no acute distress.  Mallampati 2  Eyes:   She has mild conjunctival erythema on the right..  Normal funduscopic examination.  Neck: The neck is tender over the right occiput and splenius capitis muscle.  Range of motion is fairly normal  Neurologic Exam  Mental status: The patient is alert and oriented x 3 at the time of the examination. The patient has apparent normal recent and remote memory, with an apparently normal attention span and concentration ability.   Speech is normal.  Cranial nerves: Extraocular movements are full.  She has very mild right ptosis and facial strength is normal elsewhere.  There is normal facial sensation to soft touch.   Trapezius and sternocleidomastoid strength is normal. No dysarthria is noted.  The tongue is midline, and the patient has symmetric elevation of the soft palate. No obvious hearing deficits are noted.  Motor:  Muscle bulk is normal.   Tone is normal. Strength is  5 / 5 in all 4 extremities.   Sensory: She has normal sensation to touch and vibration in the arms and legs   Gait and station: Station is normal.   Gait is normal and the tandem gait is mildly wide.  Romberg negative  DTRs:  Normal and symmetric    DIAGNOSTIC DATA (LABS, IMAGING, TESTING) - I reviewed patient records, labs, notes, testing and imaging myself where available.  Lab Results  Component Value Date   WBC 8.1 02/17/2021   HGB 14.4 02/17/2021   HCT 42.5 02/17/2021   MCV 87 02/17/2021   PLT 65 (LL) 02/17/2021      Component Value Date/Time   NA 142 02/17/2021 0845   NA 138 08/31/2016 0957   K 4.0 02/17/2021 0845   K 3.9 08/31/2016 0957   CL 107 (H) 02/17/2021 0845   CO2 21 02/17/2021 0845   CO2 22 08/31/2016 0957   GLUCOSE 110 (H) 02/17/2021 0845   GLUCOSE  148 (H) 07/10/2018 0915   GLUCOSE 106 08/31/2016 0957   BUN 10 02/17/2021 0845   BUN 8.6 08/31/2016 0957   CREATININE 0.64 02/17/2021 0845   CREATININE 0.8 08/31/2016 0957   CALCIUM 9.6 02/17/2021 0845   CALCIUM 10.3 08/31/2016 0957   PROT 7.3 02/17/2021 0845   PROT 7.6 08/31/2016 0957   ALBUMIN 4.7 02/17/2021 0845   ALBUMIN 3.9 08/31/2016 0957   AST 22 02/17/2021 0845   AST 18 08/31/2016 0957   ALT 36 (H) 02/17/2021 0845   ALT 23 08/31/2016 0957   ALKPHOS 78 02/17/2021 0845   ALKPHOS 62 08/31/2016 0957   BILITOT 0.3 02/17/2021 0845   BILITOT 0.43 08/31/2016 0957   GFRNONAA 111 02/04/2019 0902   GFRAA 128 02/04/2019 0902         PROCEDURE  The patient was placed in the supine position. A temperature strip was added to the cheek area after the area was cleaned with alcohol. The Sphenocath was lubricated with gel, and placed in the right naris. The catheter was inserted above the middle turbinate to the posterior nasal cavity, and then withdrawn 1 cm. The catheter was deployed and rotated approximately 20 towards the nose. 2 mL of 2% lidocaine was placed. The patient was asked to swallow during the injection. The patient demonstrated erythema of the sclera of the eye on this side, and an increase in the cheek temperature was noted from 32.5C to 34.0 C.   The patient tolerated the procedure well. No complications of the procedure were noted. The patient was kept in the supine position for 2-3 minutes following the procedure. She was given small sips of water after sitting up following the procedure.  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   Continue Rituxan for CNS vaculitis 2.   Continue Vimpat for seizures.   Continue Trintellix/clonazepam for mood 3.    She has nocturnal hypoxemia.   We can start 2L O2 at night.  Will refer to pulmonology to determine if pulmonary disease 4.   Trigger point injection with 80 mg Depo-Medrol in 3 cc Marcaine into the splenius capitis muscle and surrounding region on the right using sterile technique.  .  She tolerated the injection well and pain was better afterwards.   5.   Right sphenopalatine block with 2% lidocaine.  She tolerated the procedure well and there were no complications.   Return in 6 months.  She will call if she has new or worsening symptoms.    Ebbie Cherry A. Felecia Shelling, MD, PhD 8/32/9191, 6:60 PM Certified in Neurology, Clinical Neurophysiology, Sleep Medicine, Pain Medicine and Neuroimaging   [facility] [address] [place], [place] [address] [phone]

## 2021-03-18 ENCOUNTER — Ambulatory Visit (HOSPITAL_BASED_OUTPATIENT_CLINIC_OR_DEPARTMENT_OTHER): Payer: Self-pay | Admitting: Obstetrics & Gynecology

## 2021-03-18 LAB — CBC WITH DIFFERENTIAL/PLATELET
Basophils Absolute: 0 10*3/uL (ref 0.0–0.2)
Basos: 1 %
EOS (ABSOLUTE): 0.1 10*3/uL (ref 0.0–0.4)
Eos: 1 %
Hematocrit: 40.9 % (ref 34.0–46.6)
Hemoglobin: 13.8 g/dL (ref 11.1–15.9)
Immature Grans (Abs): 0 10*3/uL (ref 0.0–0.1)
Immature Granulocytes: 0 %
Lymphocytes Absolute: 2.1 10*3/uL (ref 0.7–3.1)
Lymphs: 32 %
MCH: 29.4 pg (ref 26.6–33.0)
MCHC: 33.7 g/dL (ref 31.5–35.7)
MCV: 87 fL (ref 79–97)
Monocytes Absolute: 0.5 10*3/uL (ref 0.1–0.9)
Monocytes: 7 %
Neutrophils Absolute: 3.8 10*3/uL (ref 1.4–7.0)
Neutrophils: 59 %
Platelets: 62 10*3/uL — CL (ref 150–450)
RBC: 4.7 x10E6/uL (ref 3.77–5.28)
RDW: 13.1 % (ref 11.7–15.4)
WBC: 6.5 10*3/uL (ref 3.4–10.8)

## 2021-03-18 LAB — IGG, IGA, IGM
IgA/Immunoglobulin A, Serum: 326 mg/dL (ref 87–352)
IgG (Immunoglobin G), Serum: 938 mg/dL (ref 586–1602)
IgM (Immunoglobulin M), Srm: 27 mg/dL (ref 26–217)

## 2021-03-21 ENCOUNTER — Other Ambulatory Visit: Payer: Self-pay | Admitting: Neurology

## 2021-03-21 DIAGNOSIS — F419 Anxiety disorder, unspecified: Secondary | ICD-10-CM

## 2021-03-21 MED ORDER — CLONAZEPAM 1 MG PO TABS: 1.0000 mg | ORAL_TABLET | Freq: Two times a day (BID) | ORAL | 1 refills | Status: DC | PRN

## 2021-03-21 NOTE — Telephone Encounter (Signed)
Pt has called to report that [employer] doesn't accept Good Rx so she is asking if the: clonazePAM (KLONOPIN) 1 MG tablet Can be sent to her [facility] on file.

## 2021-03-21 NOTE — Telephone Encounter (Signed)
[employer], spoke with [facility]. Cx rx clonazepam on file there. Will route rx to MD to e-scribe to [facility] instead per pt request.

## 2021-03-22 ENCOUNTER — Ambulatory Visit: Payer: 59 | Admitting: Physician Assistant

## 2021-03-29 ENCOUNTER — Encounter: Payer: Self-pay | Admitting: Psychology

## 2021-03-29 NOTE — Progress Notes (Signed)
This encounter was created in error - please disregard.

## 2021-04-05 ENCOUNTER — Encounter: Payer: Self-pay | Admitting: Neurology

## 2021-04-06 ENCOUNTER — Other Ambulatory Visit (HOSPITAL_COMMUNITY): Payer: Self-pay | Admitting: Family

## 2021-04-13 ENCOUNTER — Encounter: Payer: Self-pay | Admitting: Internal Medicine

## 2021-04-13 ENCOUNTER — Ambulatory Visit (INDEPENDENT_AMBULATORY_CARE_PROVIDER_SITE_OTHER): Payer: Self-pay | Admitting: Internal Medicine

## 2021-04-13 ENCOUNTER — Other Ambulatory Visit: Payer: Self-pay

## 2021-04-13 VITALS — BP 124/82 | HR 80 | Temp 97.9°F | Ht 60.0 in | Wt 182.6 lb

## 2021-04-13 DIAGNOSIS — R0602 Shortness of breath: Secondary | ICD-10-CM

## 2021-04-13 DIAGNOSIS — G4734 Idiopathic sleep related nonobstructive alveolar hypoventilation: Secondary | ICD-10-CM

## 2021-04-13 NOTE — Progress Notes (Signed)
Angel Rodriguez    [id number]    Dec 26, 1975  Primary Care Physician:Abonza, Samantha Crimes  Referring Physician: Britt Bottom, MD [address] [place],   [address] Reason for Consultation: nocturnal hypoxemia Date of Consultation: 04/13/2021  Chief complaint:   Chief Complaint  Patient presents with   Consult    Pt is being referred due to nocturnal hypoxemia. Denies any real complaints.     HPI: Angel Rodriguez is a 46 y.o. 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   She was previously on steroids for her vasculitis and gained a lot of weight. Now off prednisone.   She has excessive daytime sleepiness and snoring and had a sleep study which showed 44% of the total sleep time (over 6 hours) that she had oxygen saturations 80-89%  ESS 16/24. Wakes up feeling unrefreshed.   She gets peripheral nerve blockade which helps her headaches a lot.   No recurrent asthma, pneumonia or bronchitis history.    Social history:  Occupation: works as an Optometrist Exposures: lives at home with two sons and daughter.  Smoking history: less than 0.25 pack per day x 20 years, quit in 2020, previous partner smoked so she had passive smoke exposure  Social History   Occupational History   Not on file  Tobacco Use   Smoking status: Former    Years: 20.00    Types: Cigarettes, E-cigarettes    Quit date: 05/05/2018    Years since quitting: 2.9   Smokeless tobacco: Never  Vaping Use   Vaping Use: Former  Substance and Sexual Activity   Alcohol use: Not Currently   Drug use: No   Sexual activity: Yes    Birth control/protection: Surgical    Comment: hysterectomy    Relevant family history:  Family History  Problem Relation Age of Onset   Sleep apnea Mother    Alcoholism Father    Cirrhosis Father    Alcohol abuse Father    Cancer Maternal Grandmother        unsure of  type; died of other causes   Heart disease Maternal Aunt    Drug abuse Cousin     Past Medical History:  Diagnosis Date   Anxiety    Blind right eye 2009   swelling & pressure   Cluster headaches    started after seizure and migraines   Complication of anesthesia    trouble with short term memory after surgeries   COVID-19    Depression    Fibroids    s/p TLH, bilateral salpingectomy   GERD (gastroesophageal reflux disease)    patient thinks due to medications   History of anemia    prior to hysterectomy   History of pneumonia    History of trichomoniasis    Seizures ([place]) x 1 2-3 yrs ago none since   once saw neurologist, full body brain MRI and another 6 months lster   Status post right oophorectomy    Thrombocytopenia ([place])    sees [place] with Heme Onc    Past Surgical History:  Procedure Laterality Date   ABDOMINAL HYSTERECTOMY     CYSTOSCOPY N/A 07/31/2017   Procedure: CYSTOSCOPY;  Surgeon: Megan Salon, MD;  Location: [facility];  Service: Gynecology;  Laterality: N/A;   ENDOMETRIAL ABLATION     EYE SURGERY     laser - removed blood from right  eye   HYSTEROSCOPY WITH D & C     LAPAROSCOPY N/A 09/19/2017   Procedure: LAPAROSCOPY DIAGNOSTIC WITH RIGHT OOPHERECTOMY, LYSIS OF ADHESIONS;  Surgeon: Salvadore Dom, MD;  Location: [facility];  Service: Gynecology;  Laterality: N/A;   OMENTECTOMY N/A 11/21/2017   Procedure: OMENTECTOMY AND PELVIC WASHINGS;  Surgeon: Isabel Caprice, MD;  Location: [facility];  Service: Gynecology;  Laterality: N/A;   ROBOTIC ASSISTED SALPINGO OOPHERECTOMY Left 11/21/2017   Procedure: XI ROBOTIC ASSISTED LEFT SALPINGO OOPHORECTOMY;  Surgeon: Isabel Caprice, MD;  Location: [facility];  Service: Gynecology;  Laterality: Left;   TOTAL LAPAROSCOPIC HYSTERECTOMY WITH SALPINGECTOMY Bilateral 07/31/2017   Procedure: TOTAL LAPAROSCOPIC HYSTERECTOMY WITH SALPINGECTOMY;  Surgeon: Megan Salon, MD;  Location: [facility];  Service: Gynecology;  Laterality:  Bilateral;  20 week size uterus/ Alexis bag in room/ need 4.5 hours   TUBAL LIGATION     WISDOM TOOTH EXTRACTION       Physical Exam: Blood pressure 124/82, pulse 80, temperature 97.9 F (36.6 C), temperature source Oral, height 5' (1.524 m), weight 182 lb 9.6 oz (82.8 kg), last menstrual period 05/02/2017, SpO2 97 %. Gen:      No acute distress, overweight ENT:  no nasal polyps, mucus membranes moist Lungs:    No increased respiratory effort, symmetric chest wall excursion, clear to auscultation bilaterally, no wheezes or crackles CV:         Regular rate and rhythm; no murmurs, rubs, or gallops.  No pedal edema Abd:      + bowel sounds; soft, non-tender; no distension MSK: no acute synovitis of DIP or PIP joints, no mechanics hands.  Skin:      Warm and dry; no rashes Neuro: normal speech, no focal facial asymmetry Psych: alert and oriented x3, normal mood and affect   Data Reviewed/Medical Decision Making:  Independent interpretation of tests: Imaging:  Review of patient's chest xray images 2011 revealed no acute process. The patient's images have been independently reviewed by me.    PFTs: No flowsheet data found.  Labs:  Lab Results  Component Value Date   WBC 6.5 03/17/2021   HGB 13.8 03/17/2021   HCT 40.9 03/17/2021   MCV 87 03/17/2021   PLT 62 (LL) 03/17/2021   Lab Results  Component Value Date   NA 142 02/17/2021   K 4.0 02/17/2021   CL 107 (H) 02/17/2021   CO2 21 02/17/2021     Immunization status:  Immunization History  Administered Date(s) Administered   Influenza,inj,Quad PF,6+ Mos 01/16/2018, 12/30/2018, 12/04/2020   Tdap 07/09/2020     I reviewed prior external note(s) from neurology  I reviewed the result(s) of the labs and imaging as noted above.   I have ordered oxygen   Assessment:  Nocturnal Hypoxemia Snoring Excessive Daytime sleepiness CNS vasculitis Migraines  Plan/Recommendations:  She has a very mild smoking history which  would not predispose her to COPD/emphysema. We will obtain PFTs to ensure no intrinsic lung disease. Exam is reassuring. Recommend starting 2LNC nocturnally - we can prescribe this today since she's been insured.    Return to Care: Return if symptoms worsen or fail to improve.  Lenice Llamas, MD Pulmonary and [place]  CC: Sater, Nanine Means, MD

## 2021-04-13 NOTE — Patient Instructions (Signed)
I am ordering some breathing testing just to make sure nothing going on in your lungs - will contact you with the results.  I am prescribing oxygen for you to have at night time. This should help you get better quality sleep.

## 2021-04-15 ENCOUNTER — Encounter: Payer: Self-pay | Admitting: Physician Assistant

## 2021-04-15 ENCOUNTER — Ambulatory Visit (HOSPITAL_COMMUNITY)
Admission: EM | Admit: 2021-04-15 | Discharge: 2021-04-15 | Disposition: A | Discharge status: Home / Self Care | Payer: No Payment, Other | Attending: Behavioral Health | Admitting: Behavioral Health

## 2021-04-15 DIAGNOSIS — Z79899 Other long term (current) drug therapy: Secondary | ICD-10-CM | POA: Insufficient documentation

## 2021-04-15 DIAGNOSIS — Z760 Encounter for issue of repeat prescription: Secondary | ICD-10-CM | POA: Insufficient documentation

## 2021-04-15 DIAGNOSIS — R569 Unspecified convulsions: Secondary | ICD-10-CM | POA: Insufficient documentation

## 2021-04-15 NOTE — ED Triage Notes (Signed)
Pt presents to [facility] for a medication refill. Pt states that she ran out of her medication "trintellix" yesterday and she has not been feeling like herself. Pt state that she urgently needs her medication. Pt denies SI/HI and AVH. Pt is routine.

## 2021-04-15 NOTE — ED Provider Notes (Signed)
Behavioral Health Urgent Care Medical Screening Exam  Patient Name: Angel Rodriguez MRN: [id number] Date of Evaluation: 04/15/21 Diagnosis:  Final diagnoses:  Medication refill    History of Present illness: Angel Rodriguez is a 46 y.o. female patient who presents to the [facility] behavioral health urgent care voluntarily as a walk-in unaccompanied with a chief complaint of requesting a medication refill for Trintellix. Patient denies SI/HI/AVH.  Patient requested a refill for Trintellix. She states that she ran out of the Trintellix today. She states that Dr. Casimiro Needle retired last year and before he retired he prescribed her refills x1 year until she is able to find a new psychiatrist. She states that she was unable to find a new psychiatrist because she did not have insurance.   Per chart review, today patient sent a message to her PCP requesting a 2-week supply for Trintellix.  Per chart review on 12/03/2020, Patient is requesting a medication refill. Stated that she has been on the medication for the past 4 months However hasn't found a mental health care provider to refill her medications. Stated that she was prescribed  Trintellix 10 mg  x 3 tablets daily to which she has been taken as directed. NP will make Trintellix 30 mg  po daily x 30 days. Patient to follow-up with outpatient provider. Reported she has a follow up appointment  with the [facility] Oct 6th,2022.  Per chart review on 12/13/20: Patient was referred by her therapist. States she is followed by primary care provider where she is prescribed Trintellix however due to her seizure history she has not been able to take this medication.  Patient was advised that she will need to f/u here at the [facility] outpatient for medication management. Patient was advised that she will need to establish outpatient services for medication management. Patient provided with the hours for open access and encouraged to call for an appointment if  she is not able to attend open access. Patient states that she is not interested in following up upstairs for medication management.    Psychiatric Specialty Exam  Presentation  General Appearance:Appropriate for Environment; Well Groomed  Eye Contact:Fair  Speech:Clear and Coherent  Speech Volume:Normal  Handedness:No data recorded  Mood and Affect  Mood:Euthymic  Affect:Congruent   Thought Process  Thought Processes:Coherent  Descriptions of Associations:Intact  Orientation:Full (Time, Place and Person)  Thought Content:Logical  Diagnosis of Schizophrenia or Schizoaffective disorder in past: No   Hallucinations:None  Ideas of Reference:None  Suicidal Thoughts:No  Homicidal Thoughts:No   Sensorium  Memory:Immediate Fair; Remote Fair; Recent Fair  Judgment:Fair  Insight:Fair   Executive Functions  Concentration:Fair  Attention Span:Fair  [place]   Psychomotor Activity  Psychomotor Activity:Normal   Assets  Assets:Communication Skills; Desire for Improvement   Sleep  Sleep:Fair   Physical Exam: Physical Exam Constitutional:      Appearance: Normal appearance.  HENT:     Head: Normocephalic.     Nose: Nose normal.  Eyes:     Conjunctiva/sclera: Conjunctivae normal.  Cardiovascular:     Rate and Rhythm: Normal rate.  Pulmonary:     Effort: Pulmonary effort is normal.  Musculoskeletal:        General: Normal range of motion.     Cervical back: Normal range of motion.  Neurological:     Mental Status: She is alert and oriented to person, place, and time.   Review of Systems  Constitutional: Negative.   HENT: Negative.  Eyes: Negative.   Respiratory: Negative.    Cardiovascular: Negative.   Gastrointestinal: Negative.   Genitourinary: Negative.   Musculoskeletal: Negative.   Neurological: Negative.   Endo/Heme/Allergies: Negative.   Blood pressure 127/75, pulse 66, temperature  98 F (36.7 C), temperature source Oral, resp. rate 18, last menstrual period 05/02/2017, SpO2 99 %. There is no height or weight on file to calculate BMI.  Musculoskeletal: Strength & Muscle Tone: within normal limits Gait & Station: normal Patient leans: N/A   [place] [facility] Discharge Disposition for Follow up and Recommendations: Based on my evaluation the patient does not appear to have an emergency medical condition and can be discharged with resources and follow up care in outpatient services for Medication Management   Marissa Calamity, NP 04/15/2021, 6:47 PM

## 2021-04-15 NOTE — Discharge Instructions (Signed)
Discharge recommendations:  ?Please see information for follow-up appointment with psychiatry and therapy. ?Please follow up with your primary care provider for all medical related needs.  ? ?Therapy: We recommend that patient participate in individual therapy to address mental health concerns. ? ?Medications: The parent/guardian is to contact a medical professional and/or outpatient provider to address any new side effects that develop. Parent/guardian should update outpatient providers of any new medications and/or medication changes.  ? ?Safety:  ?The patient should abstain from use of illicit substances/drugs and abuse of any medications. ?If symptoms worsen or do not continue to improve or if the patient becomes actively suicidal or homicidal then it is recommended that the patient return to the closest hospital emergency department, the [facility], or call 911 for further evaluation and treatment. ?National Suicide Prevention Lifeline 1-800-SUICIDE or 1-800-273-8255. ? ?About 988 ?988 offers 24/7 access to trained crisis counselors who can help people experiencing mental health-related distress. People can call or text 988 or chat 988lifeline.org for themselves or if they are worried about a loved one who may need crisis support.  ? ? ?  ?

## 2021-04-18 ENCOUNTER — Telehealth: Payer: Self-pay | Admitting: Neurology

## 2021-04-18 MED ORDER — LACOSAMIDE 100 MG PO TABS: 1.0000 | ORAL_TABLET | Freq: Two times a day (BID) | ORAL | 1 refills | Status: DC

## 2021-04-18 NOTE — Telephone Encounter (Signed)
Pt would like a call from the nurse to discuss Lacosamide 100 MG TABS and Trintellux. Pt insisted I send a message to speak with a nurse.

## 2021-04-18 NOTE — Telephone Encounter (Signed)
Called the patient back. She was just wanting to let us know that she has gotten her new insurance. She states that she is needing a refill for the lacosamide sent to [facility]. Advised I would send that in for her. She asked if we could send trintellix in for her. I informed her that wasn't prescribed by our office and recommended she follow with primary care about getting that medication filled. Pt verbalized understanding.

## 2021-04-19 ENCOUNTER — Telehealth (HOSPITAL_COMMUNITY): Payer: Self-pay | Admitting: Psychiatry

## 2021-04-19 NOTE — Telephone Encounter (Signed)
D:  Pt sent the case mgr an email requesting that she call her.  A:  Placed call to pt and she said d/t illness she couldn't remember who she f/u with after MH-IOP for med mgmt.  Informed pt that she f/u with [facility] in Nov. 2022.  Provided pt with the [phone].  Pt states she is having difficulty getting refill on Trintellix.  R:  Pt was very appreciative of info.

## 2021-04-20 ENCOUNTER — Telehealth: Payer: Self-pay | Admitting: Internal Medicine

## 2021-04-20 ENCOUNTER — Other Ambulatory Visit: Payer: Self-pay | Admitting: Physician Assistant

## 2021-04-20 ENCOUNTER — Other Ambulatory Visit: Payer: Self-pay | Admitting: Neurology

## 2021-04-20 DIAGNOSIS — E559 Vitamin D deficiency, unspecified: Secondary | ICD-10-CM

## 2021-04-20 DIAGNOSIS — G4734 Idiopathic sleep related nonobstructive alveolar hypoventilation: Secondary | ICD-10-CM

## 2021-04-20 MED ORDER — VITAMIN D (ERGOCALCIFEROL) 1.25 MG (50000 UNIT) PO CAPS: 50000.0000 [IU] | ORAL_CAPSULE | ORAL | 0 refills | Status: DC

## 2021-04-20 NOTE — Telephone Encounter (Signed)
Patient checking on oxygen order. Patient phone number is [phone].

## 2021-04-22 NOTE — Telephone Encounter (Signed)
Called and spoke with [place]. She stated that they received the order for the oxygen at night but the test from 10/2020 is too old to use. If she needs O2 at night, she will need to complete an ONO so they will have updated information.   Dr. Shearon Stalls, please advise if you are ok with [place] placing the order for the ONO.   Thanks!

## 2021-04-25 NOTE — Addendum Note (Signed)
Addended by: Dessie Coma on: 04/25/2021 12:33 PM   Modules accepted: Orders

## 2021-04-25 NOTE — Telephone Encounter (Signed)
I have placed the order for the ONO per Dr. Shearon Stalls. Nothing further needed.

## 2021-04-28 ENCOUNTER — Encounter (HOSPITAL_BASED_OUTPATIENT_CLINIC_OR_DEPARTMENT_OTHER): Payer: Self-pay | Admitting: Obstetrics & Gynecology

## 2021-04-28 ENCOUNTER — Ambulatory Visit (HOSPITAL_BASED_OUTPATIENT_CLINIC_OR_DEPARTMENT_OTHER): Payer: 59 | Admitting: Obstetrics & Gynecology

## 2021-04-28 ENCOUNTER — Other Ambulatory Visit: Payer: Self-pay

## 2021-04-28 ENCOUNTER — Other Ambulatory Visit (HOSPITAL_COMMUNITY)
Admission: RE | Admit: 2021-04-28 | Discharge: 2021-04-28 | Disposition: A | Discharge status: Home / Self Care | Payer: 59 | Source: Ambulatory Visit | Attending: Obstetrics & Gynecology | Admitting: Obstetrics & Gynecology

## 2021-04-28 VITALS — BP 106/70 | HR 63 | Ht 60.0 in | Wt 180.2 lb

## 2021-04-28 DIAGNOSIS — Z113 Encounter for screening for infections with a predominantly sexual mode of transmission: Secondary | ICD-10-CM | POA: Diagnosis not present

## 2021-04-28 DIAGNOSIS — D3910 Neoplasm of uncertain behavior of unspecified ovary: Secondary | ICD-10-CM | POA: Diagnosis not present

## 2021-04-28 DIAGNOSIS — N898 Other specified noninflammatory disorders of vagina: Secondary | ICD-10-CM | POA: Insufficient documentation

## 2021-04-28 NOTE — Progress Notes (Signed)
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 [id number] Single Other or two or more races female here for six month recheck.  Does not want to follow up with oncology.  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  Denies pelvic pain.  No nausea.  No bladder issues.  Is having some vaginal discharge and would like testing today including for STDs.  Did blood work in 08/2020 (reviewed results).  Will just do vaginal testing.    Pt does have some GI symptoms but is on metformin and this is stable for her.  GYNECOLOGIC HISTORY: Patient's last menstrual period was 05/02/2017. Contraception: hysterectomy Menopausal hormone therapy: none  Patient Active Problem List   Diagnosis Date Noted   Major depressive disorder, single episode, moderate ([place]) 02/11/2021   Major depressive disorder, recurrent episode, severe ([place]) 11/23/2020   Nocturnal hypoxemia 10/27/2020   Type 2 diabetes mellitus without complication, without long-term current use of insulin ([place]) 08/16/2020   OSA (obstructive sleep apnea) 08/16/2020   Ingrowing toenail of left foot 01/20/2020   CNS vasculitis ([place]) 12/08/2019   Pain in both hands 12/08/2019   COVID-19 virus infection 03/06/2019   GAD (generalized anxiety disorder) 09/03/2018   Major depressive disorder, single episode, in partial remission ([place]) 06/06/2018   Panic disorder 06/06/2018   Flank pain 05/27/2018   Acute left-sided low back pain without sciatica 05/27/2018   Mood disorder ([place]) 05/09/2018   Mental confusion/ slurred speech 05/09/2018   High risk medication use 05/09/2018   Bilateral hand numbness 03/18/2018   Vitamin D deficiency 03/18/2018   Thrombocytopenia ([place]) 03/14/2018   Dysarthria 03/14/2018   Brain lesion 03/14/2018   Right sided weakness 03/13/2018   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   Abnormal MRI of head 01/21/2016   Vascular headache 09/15/2015   Insomnia 09/15/2015    Headache 07/08/2015   Intractable chronic migraine without aura and with status migrainosus 07/08/2015   Neck pain 06/24/2015   Seizure ([place]) 06/24/2015   Central retinal vein occlusion 12/11/2011   Cotton wool exudates 12/11/2011   Cystoid macular edema 12/11/2011    Past Medical History:  Diagnosis Date   Anxiety    Blind right eye 2009   swelling & pressure   Cluster headaches    started after seizure and migraines   Complication of anesthesia    trouble with short term memory after surgeries   COVID-19    Depression    Fibroids    s/p TLH, bilateral salpingectomy   GERD (gastroesophageal reflux disease)    patient thinks due to medications   History of anemia    prior to hysterectomy   History of pneumonia    History of trichomoniasis    Seizures ([place]) x 1 2-3 yrs ago none since   once saw neurologist, full body brain MRI and another 6 months lster   Status post right oophorectomy    Thrombocytopenia ([place])    sees [place] with Heme Onc   Vasculitis ([place])    neuro    Past Surgical History:  Procedure Laterality Date   ABDOMINAL HYSTERECTOMY     CYSTOSCOPY N/A 07/31/2017   Procedure: CYSTOSCOPY;  Surgeon: Megan Salon, MD;  Location: [facility];  Service: Gynecology;  Laterality: N/A;   ENDOMETRIAL ABLATION     EYE SURGERY     laser - removed blood from right eye   HYSTEROSCOPY WITH D & C     LAPAROSCOPY  N/A 09/19/2017   Procedure: LAPAROSCOPY DIAGNOSTIC WITH RIGHT OOPHERECTOMY, LYSIS OF ADHESIONS;  Surgeon: Romualdo Bolk, MD;  Location: [facility];  Service: Gynecology;  Laterality: N/A;   OMENTECTOMY N/A 11/21/2017   Procedure: OMENTECTOMY AND PELVIC WASHINGS;  Surgeon: Shonna Chock, MD;  Location: [facility];  Service: Gynecology;  Laterality: N/A;   ROBOTIC ASSISTED SALPINGO OOPHERECTOMY Left 11/21/2017   Procedure: XI ROBOTIC ASSISTED LEFT SALPINGO OOPHORECTOMY;  Surgeon: Shonna Chock, MD;  Location: [facility];  Service: Gynecology;  Laterality: Left;    TOTAL LAPAROSCOPIC HYSTERECTOMY WITH SALPINGECTOMY Bilateral 07/31/2017   Procedure: TOTAL LAPAROSCOPIC HYSTERECTOMY WITH SALPINGECTOMY;  Surgeon: Jerene Bears, MD;  Location: [facility];  Service: Gynecology;  Laterality: Bilateral;  20 week size uterus/ Alexis bag in room/ need 4.5 hours   TUBAL LIGATION     WISDOM TOOTH EXTRACTION      MEDS:   Current Outpatient Medications on File Prior to Visit  Medication Sig Dispense Refill   Accu-Chek Softclix Lancets lancets Use as instructed 100 each 12   Acetaminophen-Codeine 300-30 MG tablet ONE TO TWO AS NEEDED FOR HEADACHE. NO MORE THAN 4/DAY 28 tablet 1   Ascorbic Acid (VITAMIN C) 1000 MG tablet Take 1,000 mg by mouth daily.     atorvastatin (LIPITOR) 40 MG tablet Take 40 mg by mouth daily.     atropine 1 % ophthalmic solution Place 1 drop into the right eye 3 (three) times daily. 2 mL 0   b complex vitamins capsule Take 1 capsule by mouth daily.     blood glucose meter kit and supplies Dispense based on patient and insurance preference. Use to check fasting blood sugar and 2 hrs after largest meal. (FOR ICD-10 E10.9, E11.9). 1 each 0   brimonidine (ALPHAGAN P) 0.1 % SOLN Place 1 drop into the right eye 3 (three) times daily.      calcium carbonate (TUMS EX) 750 MG chewable tablet Chew 2 tablets by mouth daily as needed for heartburn.      clonazePAM (KLONOPIN) 1 MG tablet Take 1 tablet (1 mg total) by mouth 2 (two) times daily as needed for anxiety. 180 tablet 1   dorzolamide-timolol (COSOPT) 22.3-6.8 MG/ML ophthalmic solution Place 1 drop into the right eye 2 (two) times daily.      fluticasone (FLONASE) 50 MCG/ACT nasal spray Place 1-2 sprays into both nostrils daily as needed for allergies.      glucose blood (ACCU-CHEK GUIDE) test strip Use to check fasting glucose and 2 hours after largest meal 100 each 12   Lacosamide 100 MG TABS Take 1 tablet (100 mg total) by mouth 2 (two) times daily. 180 tablet 1   latanoprost (XALATAN) 0.005 %  ophthalmic solution Place 1 drop into both eyes at bedtime.     metFORMIN (GLUCOPHAGE) 500 MG tablet TAKE 1 TABLET BY MOUTH TWICE DAILY WITH A MEAL FOR 7 DAYS AND THEN TAKE 2 TABLETS BY MOUTH TWICE DAILY WITH A MEAL 360 tablet 1   nystatin cream (MYCOSTATIN) Apply 1 application topically 2 (two) times daily. Apply to affected area BID for up to 7 days. 30 g 1   SUMAtriptan (IMITREX) 100 MG tablet Take 1 tablet (100 mg total) by mouth once as needed for migraine. May repeat in 2 hours if headache persists or recurs. 18 tablet 3   TURMERIC PO Take by mouth.     verapamil (CALAN-SR) 240 MG CR tablet TAKE 1 TABLET BY MOUTH AT BEDTIME. 90 tablet 2  Vitamin D, Ergocalciferol, (DRISDOL) 1.25 MG (50000 UNIT) CAPS capsule Take 1 capsule (50,000 Units total) by mouth every 7 (seven) days. 12 capsule 0   vortioxetine HBr (TRINTELLIX) 10 MG TABS tablet Take 1 tablet (10 mg total) by mouth daily. 90 tablet 0   atorvastatin (LIPITOR) 40 MG tablet Take 1 tablet (40 mg total) by mouth daily. 30 tablet 5   No current facility-administered medications on file prior to visit.    ALLERGIES: Ivp dye [iodinated contrast media] and Bee venom  Family History  Problem Relation Age of Onset   Sleep apnea Mother    Alcoholism Father    Cirrhosis Father    Alcohol abuse Father    Cancer Maternal Grandmother        unsure of type; died of other causes   Heart disease Maternal Aunt    Drug abuse Cousin     SH:  single, former  Review of Systems  Constitutional: Negative.   Gastrointestinal:  Positive for diarrhea (loose stools at times).  Genitourinary: Negative.    PHYSICAL EXAMINATION:    BP 106/70    Pulse 63    Ht 5' (1.524 m)    Wt 180 lb 3.2 oz (81.7 kg)    LMP 05/02/2017    BMI 35.19 kg/m     General appearance: alert, cooperative and appears stated age Abdomen: soft, non-tender; bowel sounds normal; no masses,  no organomegaly Lymph:  no inguinal LAD noted  Pelvic: External genitalia:  no  lesions              Urethra:  normal appearing urethra with no masses, tenderness or lesions              Bartholins and Skenes: normal                 Vagina: normal appearing vagina with normal color and discharge, no lesions              Cervix: absent              Bimanual Exam:  Uterus:  uterus absent              Adnexa: no mass, fullness, tenderness              Rectovaginal: Yes.  .  Confirms.              Anus:  normal sphincter tone, no lesions  Chaperone, Octaviano Batty, CMA, was present for exam.  Assessment/Plan: 1. 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 Vaginal itching - yeast and BV testing obtained today  3. Screening examination for STD (sexually transmitted disease) - GC/Chl/trich testing obtained today

## 2021-04-29 LAB — CA 125: Cancer Antigen (CA) 125: 18.6 U/mL (ref 0.0–38.1)

## 2021-04-29 LAB — CERVICOVAGINAL ANCILLARY ONLY
Bacterial Vaginitis (gardnerella): NEGATIVE
Candida Glabrata: NEGATIVE
Candida Vaginitis: NEGATIVE
Chlamydia: NEGATIVE
Comment: NEGATIVE
Comment: NEGATIVE
Comment: NEGATIVE
Comment: NEGATIVE
Comment: NEGATIVE
Comment: NORMAL
Neisseria Gonorrhea: NEGATIVE
Trichomonas: NEGATIVE

## 2021-05-10 ENCOUNTER — Telehealth: Payer: Self-pay | Admitting: *Deleted

## 2021-05-10 NOTE — Telephone Encounter (Signed)
Per Dr. Felecia Shelling, Ruxience copay increased and unaffordable. Trying to get her approved for Brand Rituxan instead to see if this will be more affordable. Signed order given back to intrafusion.  ? ? ?

## 2021-05-13 ENCOUNTER — Ambulatory Visit: Payer: 59 | Admitting: Internal Medicine

## 2021-05-13 ENCOUNTER — Encounter: Payer: Self-pay | Admitting: Internal Medicine

## 2021-05-13 ENCOUNTER — Other Ambulatory Visit: Payer: Self-pay

## 2021-05-13 VITALS — BP 128/80 | HR 63 | Temp 98.7°F | Ht 60.0 in | Wt 183.4 lb

## 2021-05-13 DIAGNOSIS — G4734 Idiopathic sleep related nonobstructive alveolar hypoventilation: Secondary | ICD-10-CM

## 2021-05-13 NOTE — Progress Notes (Signed)
Error. No charge.  ?

## 2021-05-18 ENCOUNTER — Telehealth (HOSPITAL_COMMUNITY): Payer: Self-pay | Admitting: *Deleted

## 2021-05-18 NOTE — Telephone Encounter (Signed)
Former pt of Dr. Audria Nine, now being seen at [facility], who has reached out regarding Trintellix 30 mg prescription. Sharyn Lull says that that script is costing her over $200 a month and it never cost as much when Dr. Mamie Nick. Wrote for it. Writer advised that she was receiving script for Trintellix 10 mg to take 3 tablets daily ('30mg'$ ). Writer advised provider's office may need to do a PA on the medication. Sharyn Lull states that has been done previously. She asked if this office had any new providers and was advised that we would very soon. Pt asked to be put on a "waiting list" for new provider. Ms. Mauzy was transferred to appointment desk.  ?

## 2021-05-23 ENCOUNTER — Ambulatory Visit: Payer: 59 | Admitting: Physician Assistant

## 2021-05-23 NOTE — Progress Notes (Deleted)
? ?Established Patient Office Visit ? ?Subjective:  ?Patient ID: Angel Rodriguez, female    DOB: [birth year]  Age: 46 y.o. MRN: [id number] ? ?CC: No chief complaint on file. ? ? ?HPI ?Barbara Cower presents for follow up on diabetes mellitus, hyperlipidemia and mood. ? ?Diabetes: Pt denies increased urination or thirst. Pt reports medication compliance. No hypoglycemic events. Checking glucose at home.  ? ?HLD: Pt taking medication as directed without issues. Denies side effects including myalgias and RUQ pain.  ? ?Mood: ? ?Low platelet count: Denies easy bruising or bleeding. Previously evaluated by hematology. ? ?Past Medical History:  ?Diagnosis Date  ? Anxiety   ? Blind right eye 2009  ? swelling & pressure  ? Cluster headaches   ? started after seizure and migraines  ? Complication of anesthesia   ? trouble with short term memory after surgeries  ? COVID-19   ? Depression   ? Fibroids   ? s/p TLH, bilateral salpingectomy  ? GERD (gastroesophageal reflux disease)   ? patient thinks due to medications  ? History of anemia   ? prior to hysterectomy  ? History of pneumonia   ? History of trichomoniasis   ? Seizures ([place]) x 1 2-3 yrs ago none since  ? once saw neurologist, full body brain MRI and another 6 months lster  ? Status post right oophorectomy   ? Thrombocytopenia ([place])   ? sees [place] with Heme Onc  ? Vasculitis ([place])   ? neuro  ? ? ?Past Surgical History:  ?Procedure Laterality Date  ? ABDOMINAL HYSTERECTOMY    ? CYSTOSCOPY N/A 07/31/2017  ? Procedure: CYSTOSCOPY;  Surgeon: Megan Salon, MD;  Location: [facility];  Service: Gynecology;  Laterality: N/A;  ? ENDOMETRIAL ABLATION    ? EYE SURGERY    ? laser - removed blood from right eye  ? HYSTEROSCOPY WITH D & C    ? LAPAROSCOPY N/A 09/19/2017  ? Procedure: LAPAROSCOPY DIAGNOSTIC WITH RIGHT OOPHERECTOMY, LYSIS OF ADHESIONS;  Surgeon: Salvadore Dom, MD;  Location: [facility];  Service: Gynecology;  Laterality: N/A;  ? OMENTECTOMY N/A 11/21/2017  ? Procedure:  OMENTECTOMY AND PELVIC WASHINGS;  Surgeon: Isabel Caprice, MD;  Location: [facility];  Service: Gynecology;  Laterality: N/A;  ? ROBOTIC ASSISTED SALPINGO OOPHERECTOMY Left 11/21/2017  ? Procedure: XI ROBOTIC ASSISTED LEFT SALPINGO OOPHORECTOMY;  Surgeon: Isabel Caprice, MD;  Location: [facility];  Service: Gynecology;  Laterality: Left;  ? TOTAL LAPAROSCOPIC HYSTERECTOMY WITH SALPINGECTOMY Bilateral 07/31/2017  ? Procedure: TOTAL LAPAROSCOPIC HYSTERECTOMY WITH SALPINGECTOMY;  Surgeon: Megan Salon, MD;  Location: [facility];  Service: Gynecology;  Laterality: Bilateral;  20 week size uterus/ Alexis bag in room/ need 4.5 hours  ? TUBAL LIGATION    ? WISDOM TOOTH EXTRACTION    ? ? ?Family History  ?Problem Relation Age of Onset  ? Sleep apnea Mother   ? Alcoholism Father   ? Cirrhosis Father   ? Alcohol abuse Father   ? Cancer Maternal Grandmother   ?     unsure of type; died of other causes  ? Heart disease Maternal Aunt   ? Drug abuse Cousin   ? ? ?Social History  ? ?Socioeconomic History  ? Marital status: Single  ?  Spouse name: Not on file  ? Number of children: 3  ? Years of education: Not on file  ? Highest education level: Associate degree: occupational, Hotel manager, or vocational program  ?Occupational History  ? Not on  file  ?Tobacco Use  ? Smoking status: Former  ?  Years: 20.00  ?  Types: Cigarettes, E-cigarettes  ?  Quit date: 05/05/2018  ?  Years since quitting: 3.0  ? Smokeless tobacco: Never  ?Vaping Use  ? Vaping Use: Former  ?Substance and Sexual Activity  ? Alcohol use: Not Currently  ? Drug use: No  ? Sexual activity: Yes  ?  Birth control/protection: Surgical  ?  Comment: hysterectomy  ?Other Topics Concern  ? Not on file  ?Social History Narrative  ? Not on file  ? ?Social Determinants of Health  ? ?Financial Resource Strain: Not on file  ?Food Insecurity: Not on file  ?Transportation Needs: Not on file  ?Physical Activity: Not on file  ?Stress: Not on file  ?Social Connections: Not on file  ?Intimate  Partner Violence: Not on file  ? ? ?Outpatient Medications Prior to Visit  ?Medication Sig Dispense Refill  ? Accu-Chek Softclix Lancets lancets Use as instructed 100 each 12  ? Acetaminophen-Codeine 300-30 MG tablet ONE TO TWO AS NEEDED FOR HEADACHE. NO MORE THAN 4/DAY 28 tablet 1  ? Ascorbic Acid (VITAMIN C) 1000 MG tablet Take 1,000 mg by mouth daily.    ? atorvastatin (LIPITOR) 40 MG tablet Take 1 tablet (40 mg total) by mouth daily. 30 tablet 5  ? atorvastatin (LIPITOR) 40 MG tablet Take 40 mg by mouth daily.    ? atropine 1 % ophthalmic solution Place 1 drop into the right eye 3 (three) times daily. 2 mL 0  ? b complex vitamins capsule Take 1 capsule by mouth daily.    ? blood glucose meter kit and supplies Dispense based on patient and insurance preference. Use to check fasting blood sugar and 2 hrs after largest meal. (FOR ICD-10 E10.9, E11.9). 1 each 0  ? brimonidine (ALPHAGAN P) 0.1 % SOLN Place 1 drop into the right eye 3 (three) times daily.     ? calcium carbonate (TUMS EX) 750 MG chewable tablet Chew 2 tablets by mouth daily as needed for heartburn.     ? clonazePAM (KLONOPIN) 1 MG tablet Take 1 tablet (1 mg total) by mouth 2 (two) times daily as needed for anxiety. 180 tablet 1  ? dorzolamide-timolol (COSOPT) 22.3-6.8 MG/ML ophthalmic solution Place 1 drop into the right eye 2 (two) times daily.     ? fluticasone (FLONASE) 50 MCG/ACT nasal spray Place 1-2 sprays into both nostrils daily as needed for allergies.     ? glucose blood (ACCU-CHEK GUIDE) test strip Use to check fasting glucose and 2 hours after largest meal 100 each 12  ? Lacosamide 100 MG TABS Take 1 tablet (100 mg total) by mouth 2 (two) times daily. 180 tablet 1  ? latanoprost (XALATAN) 0.005 % ophthalmic solution Place 1 drop into both eyes at bedtime.    ? metFORMIN (GLUCOPHAGE) 500 MG tablet TAKE 1 TABLET BY MOUTH TWICE DAILY WITH A MEAL FOR 7 DAYS AND THEN TAKE 2 TABLETS BY MOUTH TWICE DAILY WITH A MEAL 360 tablet 1  ? nystatin cream  (MYCOSTATIN) Apply 1 application topically 2 (two) times daily. Apply to affected area BID for up to 7 days. 30 g 1  ? SUMAtriptan (IMITREX) 100 MG tablet Take 1 tablet (100 mg total) by mouth once as needed for migraine. May repeat in 2 hours if headache persists or recurs. 18 tablet 3  ? TURMERIC PO Take by mouth.    ? verapamil (CALAN-SR) 240 MG CR tablet  TAKE 1 TABLET BY MOUTH AT BEDTIME. 90 tablet 2  ? Vitamin D, Ergocalciferol, (DRISDOL) 1.25 MG (50000 UNIT) CAPS capsule Take 1 capsule (50,000 Units total) by mouth every 7 (seven) days. 12 capsule 0  ? vortioxetine HBr (TRINTELLIX) 10 MG TABS tablet Take 1 tablet (10 mg total) by mouth daily. 90 tablet 0  ? ?No facility-administered medications prior to visit.  ? ? ?Allergies  ?Allergen Reactions  ? Ivp Dye [Iodinated Contrast Media] Hives, Itching and Swelling  ? Bee Venom Swelling  ? ? ?ROS ?Review of Systems ?Review of Systems:  ?A fourteen system review of systems was performed and found to be positive as per HPI. ? ?  ?Objective:  ?  ?Physical Exam ?General:  Well Developed, well nourished, appropriate for stated age.  ?Neuro:  Alert and oriented,  extra-ocular muscles intact  ?HEENT:  Normocephalic, atraumatic, neck supple  ?Skin:  no gross rash, warm, pink. ?Cardiac:  RRR, S1 S2 ?Respiratory: CTA B/L  ?Vascular:  Ext warm, no cyanosis apprec.; cap RF less 2 sec. ?Psych:  No HI/SI, judgement and insight good, Euthymic mood. Full Affect. ? ?LMP 05/02/2017  ?Wt Readings from Last 3 Encounters:  ?05/13/21 183 lb 6.4 oz (83.2 kg)  ?04/28/21 180 lb 3.2 oz (81.7 kg)  ?04/13/21 182 lb 9.6 oz (82.8 kg)  ? ? ? ?Health Maintenance Due  ?Topic Date Due  ? COVID-19 Vaccine (1) Never done  ? FOOT EXAM  Never done  ? OPHTHALMOLOGY EXAM  Never done  ? URINE MICROALBUMIN  Never done  ? COLONOSCOPY (Pts 45-47yr Insurance coverage will need to be confirmed)  Never done  ? ? ?There are no preventive care reminders to display for this patient. ? ?Lab Results  ?Component  Value Date  ? TSH 1.030 02/17/2021  ? ?Lab Results  ?Component Value Date  ? WBC 6.5 03/17/2021  ? HGB 13.8 03/17/2021  ? HCT 40.9 03/17/2021  ? MCV 87 03/17/2021  ? PLT 62 (LL) 03/17/2021  ? ?Lab Results  ?

## 2021-05-24 ENCOUNTER — Other Ambulatory Visit: Payer: Self-pay | Admitting: Neurology

## 2021-06-01 ENCOUNTER — Ambulatory Visit: Payer: 59 | Admitting: Podiatry

## 2021-06-01 ENCOUNTER — Encounter: Payer: Self-pay | Admitting: Podiatry

## 2021-06-01 DIAGNOSIS — L600 Ingrowing nail: Secondary | ICD-10-CM | POA: Diagnosis not present

## 2021-06-01 NOTE — Patient Instructions (Signed)

## 2021-06-01 NOTE — Progress Notes (Signed)
Subjective:  ? ?Patient ID: Angel Rodriguez, female   DOB: 46 y.o.   MRN: [id number]  ? ?HPI ?Patient states she is starting to get an ingrown toenail on her right big toe and is concerned about a spicule on the left big toe which can at times catch and be irritated ? ? ?ROS ? ? ?   ?Objective:  ?Physical Exam  ?Neurovascular status intact right medial border is incurvated but only mildly tender and she is able to reasonably keep it under control with the left showing a spicule which could be aggravating and sharp ? ?   ?Assessment:  ?Low-grade ingrown toenail deformity right hallux with a spicule on the left hallux medial side that can be irritated ? ?   ?Plan:  ?H&P reviewed both conditions and for the right she does not want surgery but it may be necessary someday which I educated her on.  For the left I recommended removal of the spicule she wants this done signed consent form and I infiltrated the left hallux 60 mg Xylocaine Marcaine mixture sterile prep done removed spicule exposed the matrix and applied phenol 3 applications 30 seconds followed by alcohol by sterile dressing.  Gave instructions on soaks and leave dressing on 24 hours but take it off earlier if any throbbing were to occur and encouraged her to call questions concerns ?   ? ? ?

## 2021-06-02 ENCOUNTER — Telehealth: Payer: Self-pay | Admitting: Neurology

## 2021-06-02 MED ORDER — LACOSAMIDE 100 MG PO TABS: 1.0000 | ORAL_TABLET | Freq: Two times a day (BID) | ORAL | 1 refills | Status: DC

## 2021-06-02 NOTE — Telephone Encounter (Signed)
Pt lost her medication for Lacosamide 100 MG TABS. Want to know if you can call in a prescription. Would like a call from the nurse. ?

## 2021-06-02 NOTE — Telephone Encounter (Signed)
Reviewed pt chart. Last seen 03/17/21 and next f/u 09/19/21.  ?Checked drug registry. Last refilled lacosamide '100mg'$  04/20/21 #180 (90days supply). ? ?I called pt to try and get more information as to what happened. She states she went out of town. Keeps some in 7 day organizer. Thinks she lost the bottle. I placed on hold. Spoke w/ Dr. Felecia Shelling. Ok to provide samples. We only have 2 bx '100mg'$  left, ok to provide this. Will also provide 4 bx '150mg'$ . Ok for her to take this increased dose for now (same directions, 1 tablet po BID).We will also authorize early refill at her pharmacy. Unsure if insurance will pay. Providing samples until they can work this out. She verbalized understanding and appreciation. Pt will pick up between 8-12pm tomorrow. Aware office closes early. Placed up front for pick up. ? ?Samples provided:  ?Vimpat '100mg'$ , 2 bx, lot: [id number], exp: 04/2024, NDC: 9833-8250-53 ?Vimpat '150mg'$ , 4 bx, lot: [id number], exp: 03/2024, NDC: 1937-9024-09 ?

## 2021-06-15 ENCOUNTER — Other Ambulatory Visit: Payer: Self-pay | Admitting: Neurology

## 2021-06-20 ENCOUNTER — Ambulatory Visit (INDEPENDENT_AMBULATORY_CARE_PROVIDER_SITE_OTHER): Payer: 59 | Admitting: Internal Medicine

## 2021-06-20 DIAGNOSIS — R0602 Shortness of breath: Secondary | ICD-10-CM | POA: Diagnosis not present

## 2021-06-20 LAB — PULMONARY FUNCTION TEST
DL/VA % pred: 94 %
DL/VA: 4.25 ml/min/mmHg/L
DLCO cor % pred: 104 %
DLCO cor: 19.27 ml/min/mmHg
DLCO unc % pred: 104 %
DLCO unc: 19.27 ml/min/mmHg
FEF 25-75 Post: 2.72 L/sec
FEF 25-75 Pre: 2.64 L/sec
FEF2575-%Change-Post: 3 %
FEF2575-%Pred-Post: 102 %
FEF2575-%Pred-Pre: 99 %
FEV1-%Change-Post: 3 %
FEV1-%Pred-Post: 103 %
FEV1-%Pred-Pre: 100 %
FEV1-Post: 2.59 L
FEV1-Pre: 2.52 L
FEV1FVC-%Change-Post: 2 %
FEV1FVC-%Pred-Pre: 100 %
FEV6-%Change-Post: 3 %
FEV6-%Pred-Post: 103 %
FEV6-%Pred-Pre: 99 %
FEV6-Post: 3.14 L
FEV6-Pre: 3.04 L
FEV6FVC-%Pred-Post: 102 %
FEV6FVC-%Pred-Pre: 102 %
FVC-%Change-Post: 0 %
FVC-%Pred-Post: 100 %
FVC-%Pred-Pre: 99 %
FVC-Post: 3.14 L
FVC-Pre: 3.11 L
Post FEV1/FVC ratio: 83 %
Post FEV6/FVC ratio: 100 %
Pre FEV1/FVC ratio: 81 %
Pre FEV6/FVC Ratio: 100 %
RV % pred: 58 %
RV: 0.89 L
TLC % pred: 97 %
TLC: 4.32 L

## 2021-06-20 NOTE — Patient Instructions (Signed)
Full PFT performed today. °

## 2021-06-20 NOTE — Progress Notes (Signed)
Full PFT performed today. °

## 2021-06-24 ENCOUNTER — Other Ambulatory Visit: Payer: Self-pay

## 2021-06-24 MED ORDER — ATORVASTATIN CALCIUM 40 MG PO TABS: 40.0000 mg | ORAL_TABLET | Freq: Every day | ORAL | 0 refills | Status: DC

## 2021-06-27 ENCOUNTER — Emergency Department: Payer: 59

## 2021-06-27 ENCOUNTER — Encounter: Payer: Self-pay | Admitting: Emergency Medicine

## 2021-06-27 DIAGNOSIS — E119 Type 2 diabetes mellitus without complications: Secondary | ICD-10-CM | POA: Insufficient documentation

## 2021-06-27 DIAGNOSIS — M25512 Pain in left shoulder: Secondary | ICD-10-CM | POA: Insufficient documentation

## 2021-06-27 DIAGNOSIS — M779 Enthesopathy, unspecified: Secondary | ICD-10-CM | POA: Diagnosis not present

## 2021-06-27 DIAGNOSIS — X501XXA Overexertion from prolonged static or awkward postures, initial encounter: Secondary | ICD-10-CM | POA: Diagnosis not present

## 2021-06-27 DIAGNOSIS — M25521 Pain in right elbow: Secondary | ICD-10-CM | POA: Diagnosis present

## 2021-06-27 IMAGING — DX DG SHOULDER 2+V*L*
3 series · 3 of 3 positions shown · non-contrast
Comparison: None.

CLINICAL DATA: Left shoulder pain

EXAM:
LEFT SHOULDER - 2+ VIEW

[shoulder axial]
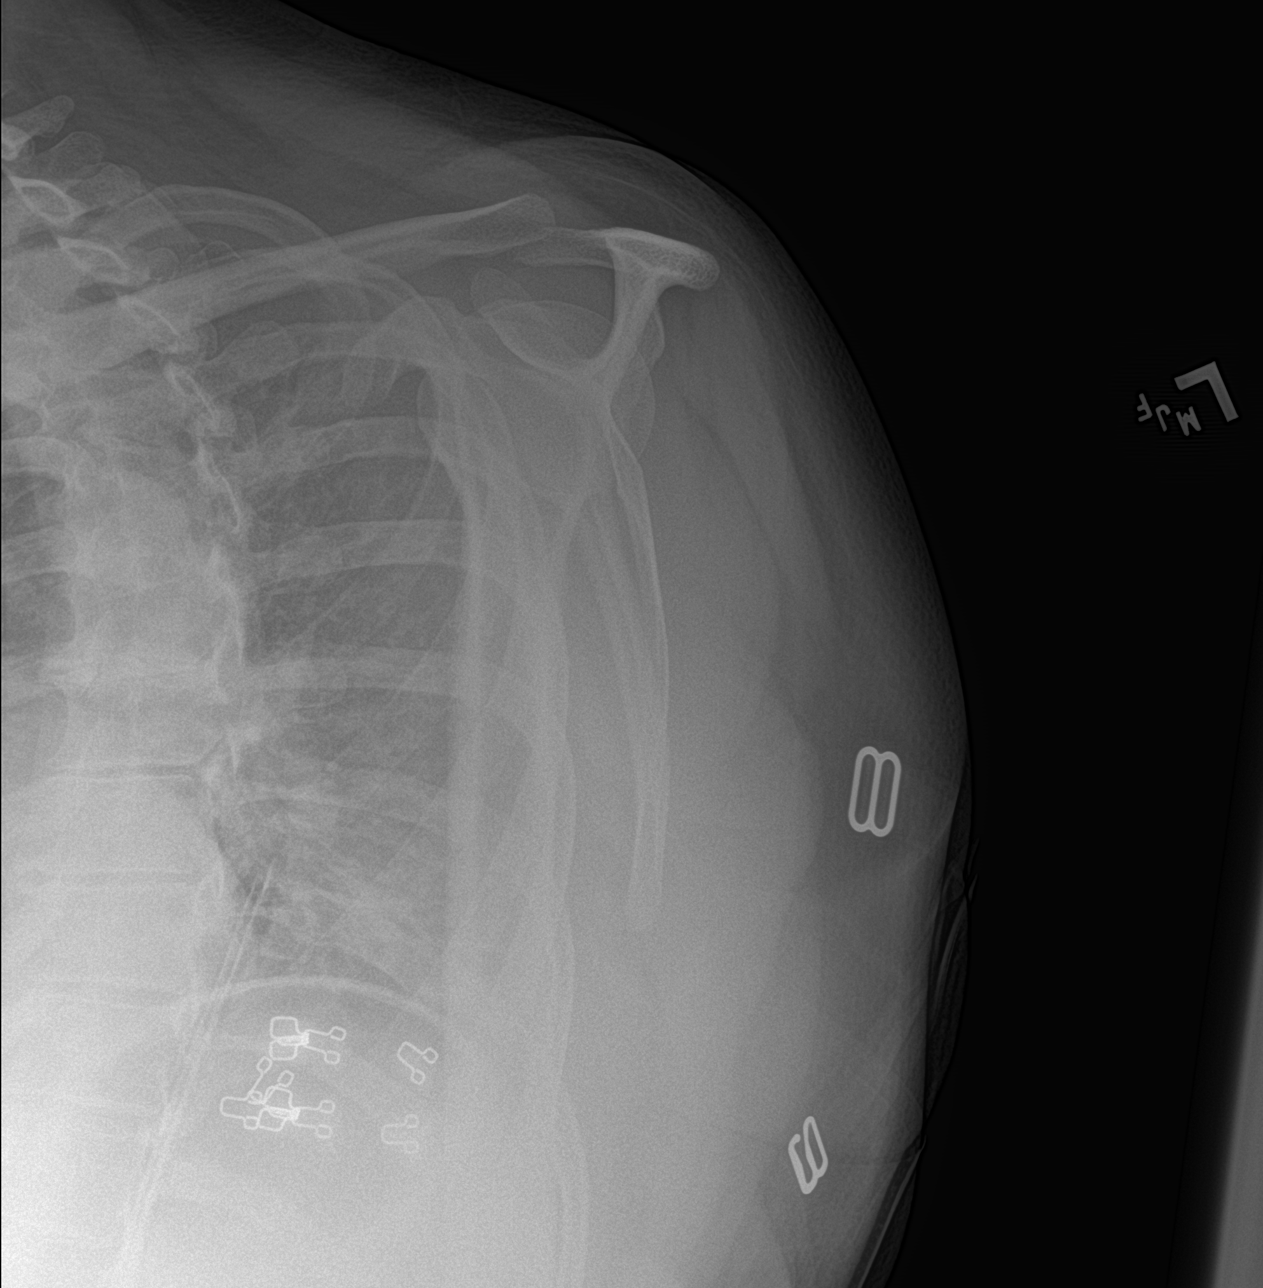

[shoulder ap]
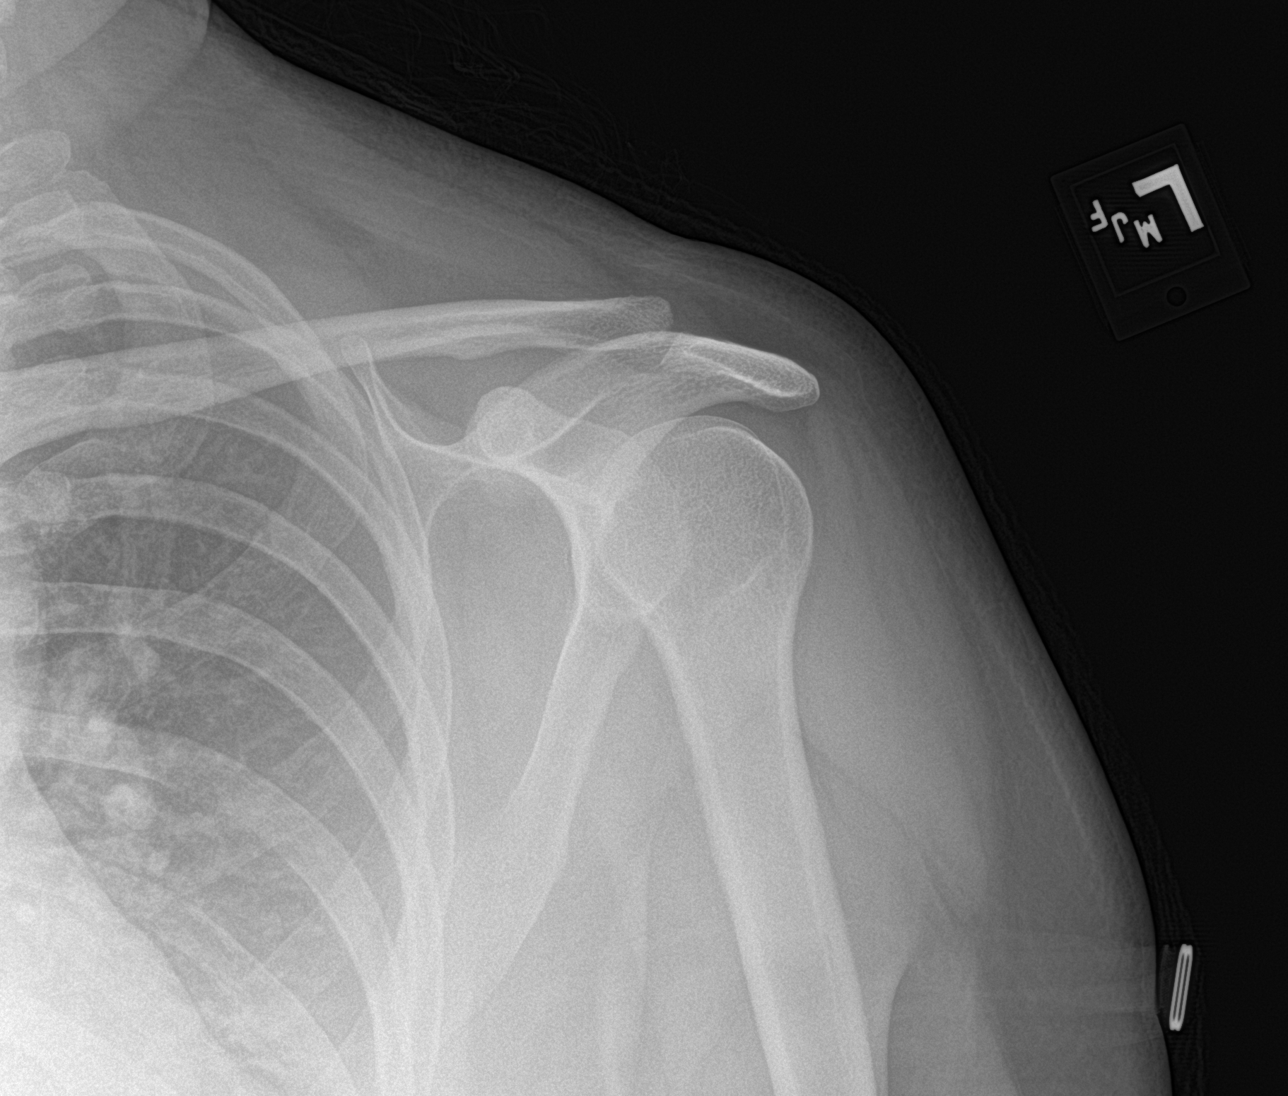

[shoulder obl]
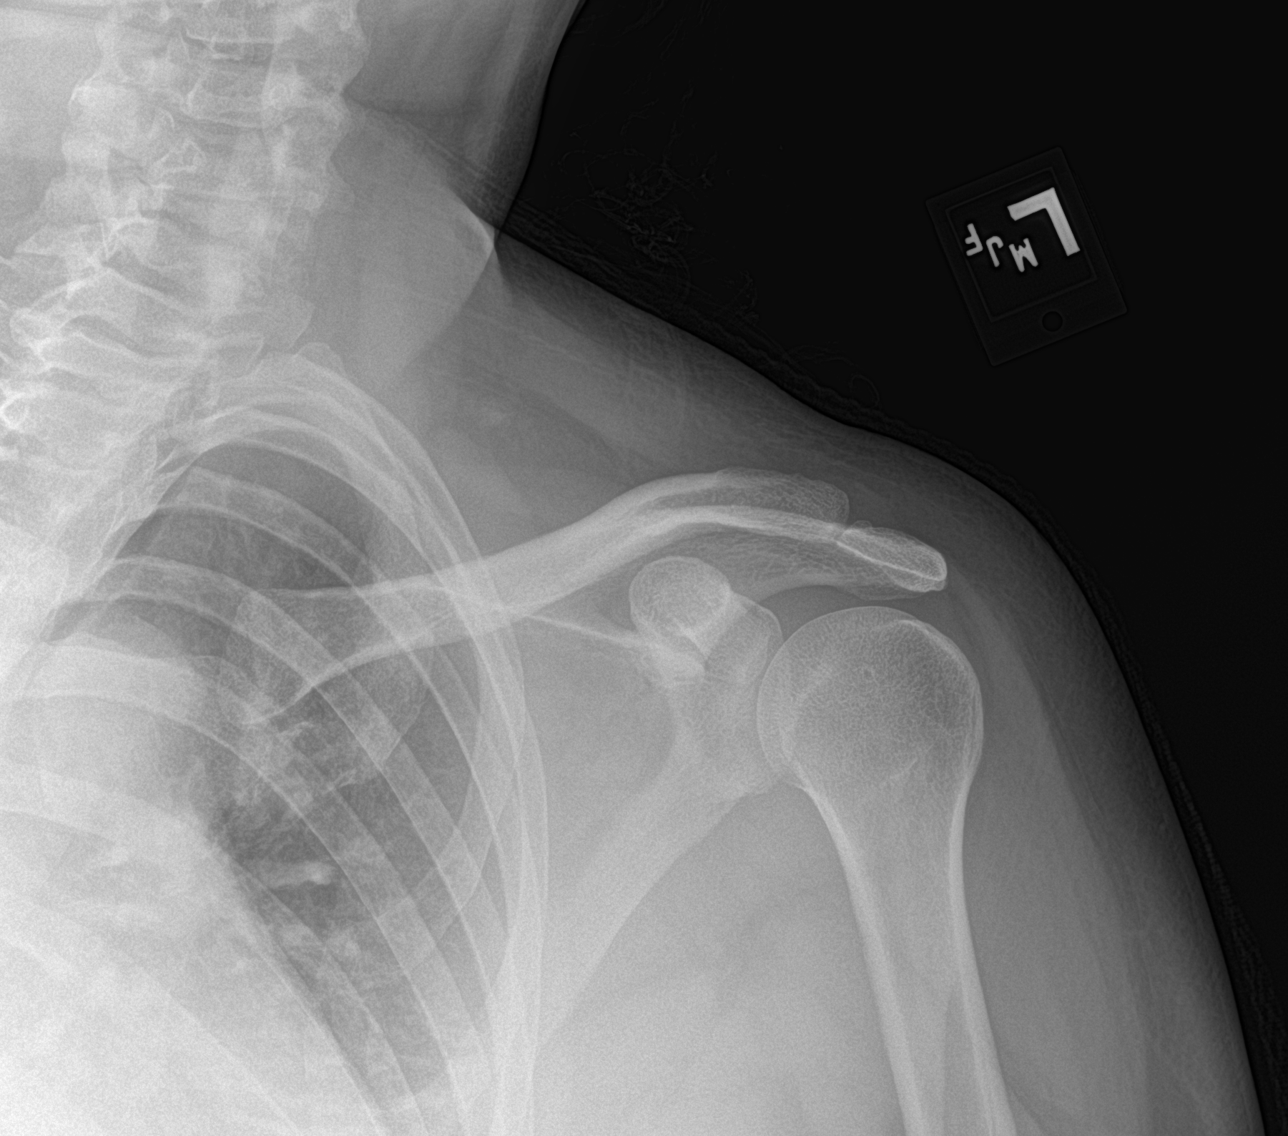

[3 of 3 positions shown; findings below may reference images not displayed]

FINDINGS: No fracture or dislocation is seen.

The joint spaces are preserved.

The visualized soft tissues are unremarkable.

Visualized left lung is clear.
IMPRESSION: Negative.

## 2021-06-27 NOTE — ED Notes (Signed)
No answer when called several times from lobby 

## 2021-06-27 NOTE — ED Triage Notes (Addendum)
Pt presents via POV with complaints of right arm pain for the last week. She notes the pain is localized at her elbow and causes her forearm to be sore. She also endorses left shoulder pain that radiates towards her neck - she describes the pain as "soreness". Denies any falls or injury to the extremity. ?

## 2021-06-28 ENCOUNTER — Emergency Department
Admission: EM | Admit: 2021-06-28 | Discharge: 2021-06-28 | Disposition: A | Discharge status: Home / Self Care | Payer: 59 | Attending: Emergency Medicine | Admitting: Emergency Medicine

## 2021-06-28 DIAGNOSIS — M778 Other enthesopathies, not elsewhere classified: Secondary | ICD-10-CM

## 2021-06-28 DIAGNOSIS — M25512 Pain in left shoulder: Secondary | ICD-10-CM

## 2021-06-28 NOTE — ED Provider Notes (Signed)
? ?[facility] ?Provider Note ? ? ? Event Date/Time  ? First MD Initiated Contact with Patient 06/28/21 0206   ?  (approximate) ? ? ?History  ? ?Arm Pain ? ? ?HPI ? ?Angel Rodriguez is a 46 y.o. female with some chronic medical issues that includes diabetes, vasculitis, nonspecific autoimmune issue according to the patient, seizures, and possibly a chronic pain disorder with some upper extremity pain and numbness.  She presents for evaluation of 2 separate complaints, acute onset pain in her left posterior upper shoulder for about 1 day, and about 1 week of pain in her right elbow/forearm. ? 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  She reports that she has pain in her right elbow when she bends and extends it.  The pain is just above the elbow joint and reproducible with palpation.  No numbness or tingling in the hand or arm.  She is able to use her hand and arm but it is uncomfortable to do so. ? ?The patient also reports that she started to have pain in the upper posterior part of her left shoulder.  It feels better if somebody rubs it or when she uses an ice pack.  Sometimes the pain is so bad it takes her breath away but it does not radiate through to the front.   ? ?No fever, no nausea nor vomiting, no recent injury of which she is aware. ?  ? ? ?Physical Exam  ? ?Triage Vital Signs: ?ED Triage Vitals [06/27/21 2008]  ?Enc Vitals Group  ?   BP 127/64  ?   Pulse Rate 88  ?   Resp 20  ?   Temp 97.8 ?F (36.6 ?C)  ?   Temp src   ?   SpO2 97 %  ?   Weight 79.4 kg (175 lb)  ?   Height 1.524 m (5')  ?   Head Circumference   ?   Peak Flow   ?   Pain Score 10  ?   Pain Loc   ?   Pain Edu?   ?   Excl. in [place]?   ? ? ?Most recent vital signs: ?Vitals:  ? 06/28/21 0142 06/28/21 0301  ?BP: 119/62 111/61  ?Pulse: 72 64  ?Resp: 18 16  ?Temp:  97.7 ?F (36.5 ?C)  ?SpO2: 97% 96%  ? ? ? ?General: Awake, at times she appears uncomfortable but is not in distress. ?CV:  Good peripheral perfusion.   ?Resp:  Normal effort.  ?Abd:  No distention.  No tenderness to palpation. ?Other:  Highly reproducible tenderness to palpation just proximal to the right elbow joint at a tendon insertion point.  No effusion, no swelling, no bursitis.  Easily palpable right radial pulse. ? ?  The patient also has easily reproducible musculoskeletal tenderness to massage and manipulation of the top posterior part of her left shoulder.  No palpable deformities.  Normal range of motion. ? ? ?ED Results / Procedures / Treatments  ? ? ? ? ?IMPRESSION / MDM / ASSESSMENT AND PLAN / ED COURSE  ?I reviewed the triage vital signs and the nursing notes. ?             ?               ? ?Differential diagnosis includes, but is not limited to, musculoskeletal strain, tendinitis, bursitis, much less likely atypical ACS presentation or PE. ? ?Vital signs are all stable and  within normal limits.  Overall physical exam is reassuring.  The highly reproducible nature of the symptoms including both the tendinitis on the right elbow and the left shoulder musculoskeletal discomfort is also reassuring.  No indication for labs.  ? ? I personally reviewed the patient's x-rays of her left shoulder and right elbow and I see no evidence of fracture nor dislocation.  The radiologist agrees. ? ?I provided reassurance and had my usual MSK discomfort discussion with her including over-the-counter pain medicine, cold packs, etc.  I provided her the name and number of an orthopedic specialist with whom she can follow-up.  She understands and agrees with the plan. ? ? ? ? ?  ? ? ?FINAL CLINICAL IMPRESSION(S) / ED DIAGNOSES  ? ?Final diagnoses:  ?Tendonitis of elbow, right  ?Acute pain of left shoulder  ? ? ? ?Rx / DC Orders  ? ?ED Discharge Orders   ? ? None  ? ?  ? ? ? ?Note:  This document was prepared using Dragon voice recognition software and may include unintentional dictation errors. ?  ?Hinda Kehr, MD ?06/28/21 [phone] ? ?

## 2021-06-28 NOTE — ED Notes (Signed)
Pt given two ice pack one each for her right elbow and left scapula  ?

## 2021-06-28 NOTE — ED Notes (Signed)
MD at the bedside  

## 2021-06-28 NOTE — ED Notes (Signed)
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

## 2021-06-28 NOTE — ED Notes (Signed)
Pt to STAT desk to st "she fell asleep" in the lobby; pt placed back on waiting board ?

## 2021-06-29 ENCOUNTER — Telehealth (INDEPENDENT_AMBULATORY_CARE_PROVIDER_SITE_OTHER): Payer: 59 | Admitting: Internal Medicine

## 2021-06-29 ENCOUNTER — Encounter: Payer: Self-pay | Admitting: Neurology

## 2021-06-29 ENCOUNTER — Encounter: Payer: Self-pay | Admitting: Internal Medicine

## 2021-06-29 DIAGNOSIS — G4734 Idiopathic sleep related nonobstructive alveolar hypoventilation: Secondary | ICD-10-CM | POA: Diagnosis not present

## 2021-06-29 DIAGNOSIS — Z87891 Personal history of nicotine dependence: Secondary | ICD-10-CM | POA: Diagnosis not present

## 2021-06-29 NOTE — Patient Instructions (Signed)
Please schedule follow up scheduled with myself in 1 year.  If my schedule is not open yet, we will contact you with a reminder closer to that time. Please call [phone] if you haven't heard from [place] a month before.  ? ?No evidence of smoking related lung disease - good news! ?We will send humidification for your oxygen to [facility]. ? ?Call me if any issues or concerns.  ? ?

## 2021-06-29 NOTE — Progress Notes (Signed)
? ?I connected with Angel Rodriguez  on 06/29/21 at  9:00 AM EDT by telephone and verified that I am speaking with the correct person using two identifiers. ? ?Location: ?Patient: Home ?Provider: [facility], [address], [place], [place] ?  ?I discussed the limitations, risks, security and privacy concerns of performing an evaluation and management service by telephone and the availability of in person appointments. I also discussed with the patient that there may be a patient responsible charge related to this service. The patient expressed understanding and agreed to proceed. ? ?      ?Angel Rodriguez    [id number]    1975/10/02 ? ?[place], PA-C ?Date of Appointment: 06/29/2021 ?Established Patient Visit ? ?Chief complaint:   ?Chief Complaint  ?Patient presents with  ? follow up pft  ? ? ? 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  ? ?Interval Updates: ?Here for follow up after PFTs.  ?She is feeling improvement in her sleep and feeling more refreshed since starting oxygen. Asking for humidification.  ? ?Denies dyspnea.  ? ?Did feel some subjective improvement with her bronchodilator challenge but did not have significant improvement.  ? ?I have reviewed the patient's family social and past medical history and updated as appropriate.  ? ?Past Medical History:  ?Diagnosis Date  ? Anxiety   ? Blind right eye 2009  ? swelling & pressure  ? Cluster headaches   ? started after seizure and migraines  ? Complication of anesthesia   ? trouble with short term memory after surgeries  ? COVID-19   ? Depression   ? Fibroids   ? s/p TLH, bilateral salpingectomy  ? GERD (gastroesophageal reflux disease)   ? patient thinks due to medications  ? History of anemia   ? prior to hysterectomy  ? History of pneumonia   ? History of trichomoniasis   ? Seizures ([place]) x 1 2-3 yrs ago none since  ? once saw neurologist, full body  brain MRI and another 6 months lster  ? Status post right oophorectomy   ? Thrombocytopenia ([place])   ? sees [place] with Heme Onc  ? 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 ? ?Past Surgical History:  ?Procedure Laterality Date  ? ABDOMINAL HYSTERECTOMY    ? CYSTOSCOPY N/A 07/31/2017  ? Procedure: CYSTOSCOPY;  Surgeon: Megan Salon, MD;  Location: [facility];  Service: Gynecology;  Laterality: N/A;  ? ENDOMETRIAL ABLATION    ? EYE SURGERY    ? laser - removed blood from right eye  ? HYSTEROSCOPY WITH D & C    ? LAPAROSCOPY N/A 09/19/2017  ? Procedure: LAPAROSCOPY DIAGNOSTIC WITH RIGHT OOPHERECTOMY, LYSIS OF ADHESIONS;  Surgeon: Salvadore Dom, MD;  Location: [facility];  Service: Gynecology;  Laterality: N/A;  ? OMENTECTOMY N/A 11/21/2017  ? Procedure: OMENTECTOMY AND PELVIC WASHINGS;  Surgeon: Isabel Caprice, MD;  Location: [facility];  Service: Gynecology;  Laterality: N/A;  ? ROBOTIC ASSISTED SALPINGO OOPHERECTOMY Left 11/21/2017  ? Procedure: XI ROBOTIC ASSISTED LEFT SALPINGO OOPHORECTOMY;  Surgeon: Isabel Caprice, MD;  Location: [facility];  Service: Gynecology;  Laterality: Left;  ? TOTAL LAPAROSCOPIC HYSTERECTOMY WITH SALPINGECTOMY Bilateral 07/31/2017  ? Procedure: TOTAL LAPAROSCOPIC HYSTERECTOMY WITH SALPINGECTOMY;  Surgeon: Megan Salon, MD;  Location: [facility];  Service: Gynecology;  Laterality: Bilateral;  20 week size uterus/ Alexis bag in room/ need 4.5 hours  ?  TUBAL LIGATION    ? WISDOM TOOTH EXTRACTION    ? ? ?Family History  ?Problem Relation Age of Onset  ? Sleep apnea Mother   ? Alcoholism Father   ? Cirrhosis Father   ? Alcohol abuse Father   ? Cancer Maternal Grandmother   ?     unsure of type; died of other causes  ? Heart disease Maternal Aunt   ? Drug abuse Cousin   ? ? ?Social History  ? ?Occupational History  ? Not on file  ?Tobacco Use  ? Smoking status: Former  ?  Years: 20.00  ?  Types: Cigarettes, E-cigarettes  ?  Quit date: 05/05/2018  ?  Years since quitting: 3.1  ? Smokeless tobacco: Never  ?Vaping Use   ? Vaping Use: Former  ?Substance and Sexual Activity  ? Alcohol use: Not Currently  ? Drug use: No  ? Sexual activity: Yes  ?  Birth control/protection: Surgical  ?  Comment: hysterectomy  ? ? ? ?Physical Exam: ?Last menstrual period 05/02/2017. ?None, virtual visit.  ? ?No distress, well appearing ?No increased work of breathing, wheezes ?Normal speech ? ? ?Data Reviewed: ?Imaging: ?I have personally reviewed the  ? ?PFTs: ? ? ?  Latest Ref Rng & Units 06/20/2021  ? 10:02 AM  ?PFT Results  ?FVC-Pre L 3.11    ?FVC-Predicted Pre % 99    ?FVC-Post L 3.14    ?FVC-Predicted Post % 100    ?Pre FEV1/FVC % % 81    ?Post FEV1/FCV % % 83    ?FEV1-Pre L 2.52    ?FEV1-Predicted Pre % 100    ?FEV1-Post L 2.59    ?DLCO uncorrected ml/min/mmHg 19.27    ?DLCO UNC% % 104    ?DLCO corrected ml/min/mmHg 19.27    ?DLCO COR %Predicted % 104    ?DLVA Predicted % 94    ?TLC L 4.32    ?TLC % Predicted % 97    ?RV % Predicted % 58    ? ?I have personally reviewed the patient's PFTs and they show normal pulmonary function ? ?Labs: ? ?Immunization status: ?Immunization History  ?Administered Date(s) Administered  ? Influenza,inj,Quad PF,6+ Mos 01/16/2018, 12/30/2018, 12/04/2020  ? Tdap 07/09/2020  ? ? ?External Records Personally Reviewed: neurology  ? ?Assessment:  ?Nocturnal Hypoxemia ?History of tobacco use ? ?Plan/Recommendations: ?Continue nocturnal oxygen. ?Will prescribe humidification from [facility] ? ?I spent 20 minutes on 06/29/2021 in care of this patient including face to face time and non-face to face time spent charting, review of outside records, and coordination of care. ? ? ? ?Return to Care: ?Return in about 1 year (around 06/30/2022). ? ? ?Lenice Llamas, MD ?Pulmonary and Critical Care Medicine ?[employer] ?[phone] ? ? ? ? ? ?

## 2021-06-30 ENCOUNTER — Other Ambulatory Visit: Payer: Self-pay | Admitting: Neurology

## 2021-07-04 NOTE — Telephone Encounter (Signed)
Last OV was on 03/17/21.  ?Next OV is scheduled for 09/19/21.  ?Last RX was written on 07/09/20 for 23 tabs.  ? ?[facility] [facility] Database has been reviewed. Ok to fill. ?

## 2021-07-28 ENCOUNTER — Other Ambulatory Visit: Payer: Self-pay | Admitting: Neurology

## 2021-07-28 ENCOUNTER — Telehealth: Payer: Self-pay | Admitting: *Deleted

## 2021-07-28 MED ORDER — ATORVASTATIN CALCIUM 40 MG PO TABS: 40.0000 mg | ORAL_TABLET | Freq: Every day | ORAL | 0 refills | Status: DC

## 2021-07-28 NOTE — Telephone Encounter (Signed)
Rx refill sent to pharmacy. 

## 2021-07-28 NOTE — Telephone Encounter (Signed)
Dr. Felecia Shelling- I see you last refilled for her on 06/16/21. I do not see mention of you continuing to refill this for her. Are you wanting to continue to refill this?

## 2021-09-05 ENCOUNTER — Other Ambulatory Visit: Payer: Self-pay | Admitting: *Deleted

## 2021-09-07 ENCOUNTER — Other Ambulatory Visit: Payer: Self-pay

## 2021-09-07 DIAGNOSIS — E782 Mixed hyperlipidemia: Secondary | ICD-10-CM

## 2021-09-07 MED ORDER — ATORVASTATIN CALCIUM 40 MG PO TABS: 40.0000 mg | ORAL_TABLET | Freq: Every day | ORAL | 1 refills | Status: DC

## 2021-09-19 ENCOUNTER — Encounter: Payer: Self-pay | Admitting: Neurology

## 2021-09-19 ENCOUNTER — Ambulatory Visit: Payer: 59 | Admitting: Neurology

## 2021-09-19 VITALS — BP 144/92 | HR 70 | Ht 60.0 in | Wt 182.0 lb

## 2021-09-19 DIAGNOSIS — G4734 Idiopathic sleep related nonobstructive alveolar hypoventilation: Secondary | ICD-10-CM | POA: Diagnosis not present

## 2021-09-19 DIAGNOSIS — I776 Arteritis, unspecified: Secondary | ICD-10-CM

## 2021-09-19 DIAGNOSIS — F419 Anxiety disorder, unspecified: Secondary | ICD-10-CM

## 2021-09-19 DIAGNOSIS — M542 Cervicalgia: Secondary | ICD-10-CM | POA: Diagnosis not present

## 2021-09-19 DIAGNOSIS — R569 Unspecified convulsions: Secondary | ICD-10-CM

## 2021-09-19 DIAGNOSIS — Z79899 Other long term (current) drug therapy: Secondary | ICD-10-CM

## 2021-09-19 DIAGNOSIS — G43711 Chronic migraine without aura, intractable, with status migrainosus: Secondary | ICD-10-CM | POA: Diagnosis not present

## 2021-09-19 MED ORDER — LACOSAMIDE 100 MG PO TABS: 1.0000 | ORAL_TABLET | Freq: Two times a day (BID) | ORAL | 1 refills | Status: DC

## 2021-09-19 MED ORDER — ACETAMINOPHEN-CODEINE 300-30 MG PO TABS: ORAL_TABLET | ORAL | 2 refills | Status: DC

## 2021-09-19 MED ORDER — SUMATRIPTAN SUCCINATE 100 MG PO TABS: 100.0000 mg | ORAL_TABLET | Freq: Once | ORAL | 5 refills | Status: AC | PRN

## 2021-09-19 MED ORDER — CLONAZEPAM 1 MG PO TABS: 1.0000 mg | ORAL_TABLET | Freq: Two times a day (BID) | ORAL | 1 refills | Status: DC | PRN

## 2021-09-19 MED ORDER — ONDANSETRON 4 MG PO TBDP: 4.0000 mg | ORAL_TABLET | Freq: Three times a day (TID) | ORAL | 11 refills | Status: DC | PRN

## 2021-09-19 NOTE — Progress Notes (Signed)
[facility]  PATIENT: Angel Rodriguez DOB: [birth year]  REFERRING DOCTOR OR PCP:   SOURCE: patient, ED records, images on PACS, reports in EMR  _________________________________   HISTORICAL  CHIEF COMPLAINT:  Chief Complaint  Patient presents with   Follow-up    RM 1, alone. Here for 6 month f/u. No sz like activity. Pt would like something for nausea. Pt has had SE from new medication and would like to discuss today.     HISTORY OF PRESENT ILLNESS:  Angel Rodriguez is a 46 y.o. woman with frequent headaches and h/o seizure.  Update 09/19/2021: She is on Rituxan for CNS vasculitis and has doe better.    She felt more aches with the last infusion (March, 2023).     HAs are a little better.    HA's are right > left sided and sometimes she has visua chanes.  .    Right eye are not red now but have been off/on the past month  She feels her headaches were doing better last year and have acted back up again over the last few months.     She denies any visual changes.   She has numbness in hr hands but no weakness.  The numbness is worse on the right.   It is in the palms more than dorsum.  Shaking her arms help.       She has no recent seizure.   First one was 2016 and she had several when med's were withdrawn so was restarted.  Her last one was April 2021.  She is on Vimpat.    She is on Trintellix and clonazepam for anxiety and depression.   Mood was doing better last year than this year.  She has no OSA on PSG 10/20/2020   The study found nocturnal hypoxemia and frequent bigeminy/trigeminy.    Nocturia O2 was started and she feels better.   She saw pulmonary.    PFTs were normal.      She saw cardiology.   Echo showed mild diastoic dysfunction.    Placed on a statin.     PSG 10/20/2021: No significant OSA was noted 44% of the night was with SaO2 80-89%     ---- nocturnal O2 recommended. 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  She had a second opinion from [place].  Changes in her seizure medications were made but no opinion about the possibility of vasculitis.  She was placed on prednisone 60 mg and titrating down in July 2021.  She had improvement of headaches and other symptoms.  MRI 10/15/2019 showed:   "Resolution of a region of T2 and FLAIR signal within the posterior frontal cortical and subcortical brain on the study of May. One could question slight increased  prominence of a T2 and FLAIR focus without enhancement in the subcortical brain at the bottom of a left frontal sulcus, axial FLAIR image 19. No other new or potentially progressive finding. Minimal small foci of cortical and subcortical enhancement in the left frontal region. I think the differential diagnosis for this case remains that of a vasculitis syndrome, venous thrombotic syndrome, autoimmune syndrome or demyelinating syndrome. I do not think we are dealing with tumor or arterial infarctions. "  MRI brain 07/06/2018 personally reviewed and compared to studies from January 2020.  It shows 2 foci in the anterior left frontal lobe.  One was mildly increased in size compared to the January MRI and one was decreased in size.  Another focus seen on the previous MRI in the left parasagittal frontal lobe has resolved.  MRI of the cervical and thoracic spine 03/25/2018 showed normal  spinal cord and mild cervical degenerative changes.  Labs: CSF 03/25/2018 showed 10 white blood cells (lymphocytes), 3 red cells, normal glucose, protein oligoclonal bands, VDRL.  In 03/2018, negative ANA, cANCA and pANCA, weakly positive ANCA proteinase 3, normal ACE level. ESR 10.  HIV was negative 09/22/2017.  Hepatitis C was negative 04/2016     REVIEW OF SYSTEMS: Constitutional: No fevers, chills, sweats, or change in appetite.  She has insomnia. Eyes: as above Ear, nose and throat: No hearing loss, ear pain, nasal congestion, sore throat Cardiovascular: No chest pain, palpitations Respiratory:  No shortness of breath at rest or with exertion.   No wheezes GastrointestinaI: No nausea, vomiting, diarrhea, abdominal pain, fecal incontinence Genitourinary:  No dysuria, urinary retention or frequency.  No nocturia.   Possible ovarian cancer Musculoskeletal:  No neck pain, back pain Integumentary: No rash, pruritus, skin lesions Neurological: as above Psychiatric: No depression at this time.  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 Allergic/Immunologic: No itchy/runny eyes, nasal congestion, recent allergic reactions, rashes  ALLERGIES: Allergies  Allergen Reactions   Ivp Dye [Iodinated Contrast Media] Hives, Itching and Swelling   Bee Venom Swelling    HOME MEDICATIONS:  Current Outpatient Medications:    Accu-Chek Softclix Lancets lancets, Use as instructed, Disp: 100 each, Rfl: 12   Ascorbic Acid (VITAMIN C) 1000 MG tablet, Take 1,000 mg by mouth daily., Disp: , Rfl:    atorvastatin (LIPITOR) 40 MG tablet, Take 1 tablet (40 mg total) by mouth daily., Disp: 30 tablet, Rfl: 1   atropine 1 % ophthalmic solution, INSTILL ONE DROP INTO THE RIGHT EYE THREE TIMES DAILY, Disp: 5  mL, Rfl: 0   b complex vitamins capsule, Take 1 capsule by mouth daily., Disp: , Rfl:    blood glucose meter kit and supplies, Dispense based on patient and insurance preference. Use to check fasting blood sugar and 2 hrs after largest meal. (FOR ICD-10 E10.9, E11.9)., Disp: 1 each, Rfl: 0   brimonidine (ALPHAGAN P) 0.1 % SOLN, Place 1 drop into the right eye 3 (three) times daily. , Disp: , Rfl:    calcium carbonate (TUMS EX) 750 MG chewable tablet, Chew 2 tablets by mouth daily as needed for heartburn. , Disp: , Rfl:    dorzolamide-timolol (COSOPT) 22.3-6.8 MG/ML ophthalmic solution, Place 1 drop into the right eye 2 (two) times daily. , Disp: , Rfl:    fluticasone (FLONASE) 50 MCG/ACT nasal spray, Place 1-2 sprays into  both nostrils daily as needed for allergies. , Disp: , Rfl:    glucose blood (ACCU-CHEK GUIDE) test strip, Use to check fasting glucose and 2 hours after largest meal, Disp: 100 each, Rfl: 12   latanoprost (XALATAN) 0.005 % ophthalmic solution, Place 1 drop into both eyes at bedtime., Disp: , Rfl:    metFORMIN (GLUCOPHAGE) 500 MG tablet, TAKE 1 TABLET BY MOUTH TWICE DAILY WITH A MEAL FOR 7 DAYS AND THEN TAKE 2 TABLETS BY MOUTH TWICE DAILY WITH A MEAL, Disp: 360 tablet, Rfl: 1   nystatin cream (MYCOSTATIN), Apply 1 application topically 2 (two) times daily. Apply to affected area BID for up to 7 days., Disp: 30 g, Rfl: 1   ondansetron (ZOFRAN-ODT) 4 MG disintegrating tablet, Take 1 tablet (4 mg total) by mouth every 8 (eight) hours as needed for nausea or vomiting., Disp: 20 tablet, Rfl: 11   TURMERIC PO, Take by mouth., Disp: , Rfl:    verapamil (CALAN-SR) 240 MG CR tablet, TAKE 1 TABLET BY MOUTH AT BEDTIME., Disp: 90 tablet, Rfl: 2   Vitamin D, Ergocalciferol, (DRISDOL) 1.25 MG (50000 UNIT) CAPS capsule, Take 1 capsule (50,000 Units total) by mouth every 7 (seven) days., Disp: 12 capsule, Rfl: 0   vortioxetine HBr (TRINTELLIX) 10 MG TABS tablet, Take 1 tablet (10 mg total) by mouth  daily., Disp: 90 tablet, Rfl: 0   acetaminophen-codeine (TYLENOL #3) 300-30 MG tablet, One po up to qid prn, Disp: 28 tablet, Rfl: 2   clonazePAM (KLONOPIN) 1 MG tablet, Take 1 tablet (1 mg total) by mouth 2 (two) times daily as needed for anxiety., Disp: 180 tablet, Rfl: 1   Lacosamide 100 MG TABS, Take 1 tablet (100 mg total) by mouth 2 (two) times daily., Disp: 180 tablet, Rfl: 1   SUMAtriptan (IMITREX) 100 MG tablet, Take 1 tablet (100 mg total) by mouth once as needed for migraine. May repeat in 2 hours if headache persists or recurs., Disp: 9 tablet, Rfl: 5  PAST MEDICAL HISTORY: Past Medical History:  Diagnosis Date   Anxiety    Blind right eye 2009   swelling & pressure   Cluster headaches    started after seizure and migraines   Complication of anesthesia    trouble with short term memory after surgeries   COVID-19    Depression    Fibroids    s/p TLH, bilateral salpingectomy   GERD (gastroesophageal reflux disease)    patient thinks due to medications   History of anemia    prior to hysterectomy   History of pneumonia    History of trichomoniasis    Seizures ([place]) x 1 2-3 yrs ago none since   once saw neurologist, full body brain MRI and another 6 months lster   Status post right oophorectomy    Thrombocytopenia ([place])    sees [place] with Heme Onc   Vasculitis ([place])    neuro    PAST SURGICAL HISTORY: Past Surgical History:  Procedure Laterality Date   ABDOMINAL HYSTERECTOMY     CYSTOSCOPY N/A 07/31/2017   Procedure: CYSTOSCOPY;  Surgeon: Megan Salon, MD;  Location: [facility];  Service: Gynecology;  Laterality: N/A;   ENDOMETRIAL ABLATION     EYE SURGERY     laser - removed blood from right eye   HYSTEROSCOPY WITH D & C     LAPAROSCOPY N/A 09/19/2017   Procedure: LAPAROSCOPY DIAGNOSTIC WITH RIGHT OOPHERECTOMY, LYSIS OF ADHESIONS;  Surgeon: Salvadore Dom, MD;  Location: [facility];  Service: Gynecology;  Laterality: N/A;   OMENTECTOMY N/A 11/21/2017    Procedure: OMENTECTOMY AND PELVIC WASHINGS;  Surgeon: Isabel Caprice, MD;  Location: [facility];  Service: Gynecology;  Laterality: N/A;   ROBOTIC ASSISTED SALPINGO OOPHERECTOMY Left 11/21/2017   Procedure: XI ROBOTIC ASSISTED LEFT SALPINGO OOPHORECTOMY;  Surgeon: Isabel Caprice, MD;  Location: [facility];  Service: Gynecology;  Laterality: Left;   TOTAL LAPAROSCOPIC HYSTERECTOMY WITH SALPINGECTOMY Bilateral 07/31/2017   Procedure: TOTAL LAPAROSCOPIC HYSTERECTOMY WITH SALPINGECTOMY;  Surgeon: Megan Salon, MD;  Location: [facility];  Service: Gynecology;  Laterality: Bilateral;  20 week size uterus/ Alexis bag in room/ need 4.5 hours   TUBAL LIGATION     WISDOM TOOTH EXTRACTION      FAMILY HISTORY: Family History  Problem Relation Age of Onset   Sleep apnea Mother    Alcoholism Father    Cirrhosis Father    Alcohol abuse Father    Cancer Maternal Grandmother        unsure of type; died of other causes   Heart disease Maternal Aunt    Drug abuse Cousin     SOCIAL HISTORY:  Social History   Socioeconomic History   Marital status: Single    Spouse name: Not on file   Number of children: 3   Years of education: Not on file   Highest education level: Associate degree: occupational, Hotel manager, or vocational program  Occupational History   Not on file  Tobacco Use   Smoking status: Former    Years: 20.00    Types: Cigarettes, E-cigarettes    Quit date: 05/05/2018    Years since quitting: 3.3   Smokeless tobacco: Never  Vaping Use   Vaping Use: Former  Substance and Sexual Activity   Alcohol use: Not Currently   Drug use: No   Sexual activity: Yes    Birth control/protection: Surgical    Comment: hysterectomy  Other Topics Concern   Not on file  Social History Narrative   Not on file   Social Determinants of Health   Financial Resource Strain: Not on file  Food Insecurity: Not on file  Transportation Needs: Not on file  Physical Activity: Not on file  Stress: Not on file   Social Connections: Not on file  Intimate Partner Violence: Not on file     PHYSICAL EXAM  Vitals:   09/19/21 1138  BP: (!) 144/92  Pulse: 70  Weight: 182 lb (82.6 kg)  Height: 5' (1.524 m)    Body mass index is 35.54 kg/m.   General: The patient is well-developed and well-nourished and in no acute distress.  Mallampati 2  Eyes:   She has mild conjunctival erythema on the right..  Normal funduscopic examination.  Neck: The neck is tender over the right occiput and splenius capitis muscle.  Range of motion is fairly normal  Neurologic Exam  Mental status: The patient is alert and oriented x 3 at the time of the examination. The patient has apparent normal recent and remote memory, with an apparently normal attention span and concentration ability.   Speech is normal.  Cranial nerves: Extraocular movements are full.  She has very mild right ptosis and facial strength is normal elsewhere.  There is normal facial sensation to soft touch.   Trapezius and sternocleidomastoid strength is normal. No dysarthria is noted.  Hearing is symmetric.  Motor:  Muscle bulk is normal.   Tone is normal. Strength is  5 / 5 in all 4 extremities.  Sensory: She has normal sensation to touch and vibration in the arms and legs   Gait and station: Station is normal.   Gait is normal and the tandem gait is mildly wide.  Romberg negative  DTRs:  Normal and symmetric    DIAGNOSTIC DATA (LABS, IMAGING, TESTING) - I reviewed patient records, labs, notes, testing and imaging myself where available.  Lab Results  Component Value Date   WBC 6.5 03/17/2021   HGB 13.8 03/17/2021   HCT 40.9 03/17/2021   MCV 87 03/17/2021   PLT 62 (LL) 03/17/2021      Component Value Date/Time   NA 142 02/17/2021 0845   NA 138 08/31/2016 0957   K 4.0 02/17/2021 0845   K 3.9 08/31/2016 0957   CL 107 (H) 02/17/2021 0845   CO2 21 02/17/2021 0845   CO2 22 08/31/2016 0957   GLUCOSE 110 (H) 02/17/2021 0845    GLUCOSE 148 (H) 07/10/2018 0915   GLUCOSE 106 08/31/2016 0957   BUN 10 02/17/2021 0845   BUN 8.6 08/31/2016 0957   CREATININE 0.64 02/17/2021 0845   CREATININE 0.8 08/31/2016 0957   CALCIUM 9.6 02/17/2021 0845   CALCIUM 10.3 08/31/2016 0957   PROT 7.3 02/17/2021 0845   PROT 7.6 08/31/2016 0957   ALBUMIN 4.7 02/17/2021 0845   ALBUMIN 3.9 08/31/2016 0957   AST 22 02/17/2021 0845   AST 18 08/31/2016 0957   ALT 36 (H) 02/17/2021 0845   ALT 23 08/31/2016 0957   ALKPHOS 78 02/17/2021 0845   ALKPHOS 62 08/31/2016 0957   BILITOT 0.3 02/17/2021 0845   BILITOT 0.43 08/31/2016 0957   GFRNONAA 111 02/04/2019 0902   GFRAA 128 02/04/2019 0902           ASSESSMENT AND PLAN  CNS vasculitis (HCC) - Plan: IgG, IgA, IgM, CBC with Differential/Platelet, Comprehensive metabolic panel  Seizures ([place]) - Plan: IgG, IgA, IgM, CBC with Differential/Platelet, Comprehensive metabolic panel  Nocturnal hypoxemia  Intractable chronic migraine without aura and with status migrainosus  Neck pain  Anxiety - Plan: clonazePAM (KLONOPIN) 1 MG tablet  High risk medication use - Plan: IgG, IgA, IgM, CBC with Differential/Platelet, Comprehensive metabolic panel   1.   Continue Rituxan for CNS vaculitis   check labs 2.   Continue Vimpat for seizures.   Continue Trintellix/clonazepam for mood 3.    She has nocturnal hypoxemia.   We can start 2L O2 at night.  Will refer to pulmonology to determine if pulmonary disease 4.   Trigger point injection with 80 mg Depo-Medrol in 3 cc Marcaine into the right splenius capitis muscle and splenius cervicis muscles and surrounding region on the right using sterile technique.  .  She tolerated the injection well and pain was better afterwards.   5.    Refill medications  6.  return in 6 months.  She will call if she has new or worsening symptoms.    Angel Rodriguez A. Felecia Shelling, MD, PhD 0/16/0109, 3:23 PM Certified in Neurology, Clinical Neurophysiology, Sleep Medicine, Pain  Medicine and Neuroimaging   [facility] [address], [place], [place] [address] [phone]

## 2021-09-20 ENCOUNTER — Telehealth: Payer: Self-pay | Admitting: Neurology

## 2021-09-20 LAB — COMPREHENSIVE METABOLIC PANEL
ALT: 27 IU/L (ref 0–32)
AST: 17 IU/L (ref 0–40)
Albumin/Globulin Ratio: 1.9 (ref 1.2–2.2)
Albumin: 4.8 g/dL (ref 3.9–4.9)
Alkaline Phosphatase: 81 IU/L (ref 44–121)
BUN/Creatinine Ratio: 15 (ref 9–23)
BUN: 9 mg/dL (ref 6–24)
Bilirubin Total: 0.3 mg/dL (ref 0.0–1.2)
CO2: 23 mmol/L (ref 20–29)
Calcium: 9.9 mg/dL (ref 8.7–10.2)
Chloride: 105 mmol/L (ref 96–106)
Creatinine, Ser: 0.61 mg/dL (ref 0.57–1.00)
Globulin, Total: 2.5 g/dL (ref 1.5–4.5)
Glucose: 98 mg/dL (ref 70–99)
Potassium: 3.9 mmol/L (ref 3.5–5.2)
Sodium: 142 mmol/L (ref 134–144)
Total Protein: 7.3 g/dL (ref 6.0–8.5)
eGFR: 111 mL/min/{1.73_m2} (ref >59)

## 2021-09-20 LAB — CBC WITH DIFFERENTIAL/PLATELET
Basophils Absolute: 0 10*3/uL (ref 0.0–0.2)
Basos: 0 %
EOS (ABSOLUTE): 0.1 10*3/uL (ref 0.0–0.4)
Eos: 2 %
Hematocrit: 38.7 % (ref 34.0–46.6)
Hemoglobin: 13.1 g/dL (ref 11.1–15.9)
Immature Grans (Abs): 0 10*3/uL (ref 0.0–0.1)
Immature Granulocytes: 0 %
Lymphocytes Absolute: 1.9 10*3/uL (ref 0.7–3.1)
Lymphs: 34 %
MCH: 29.4 pg (ref 26.6–33.0)
MCHC: 33.9 g/dL (ref 31.5–35.7)
MCV: 87 fL (ref 79–97)
Monocytes Absolute: 0.4 10*3/uL (ref 0.1–0.9)
Monocytes: 6 %
Neutrophils Absolute: 3.3 10*3/uL (ref 1.4–7.0)
Neutrophils: 58 %
Platelets: 82 10*3/uL — CL (ref 150–450)
RBC: 4.46 x10E6/uL (ref 3.77–5.28)
RDW: 13.1 % (ref 11.7–15.4)
WBC: 5.7 10*3/uL (ref 3.4–10.8)

## 2021-09-20 LAB — IGG, IGA, IGM
IgA/Immunoglobulin A, Serum: 335 mg/dL (ref 87–352)
IgG (Immunoglobin G), Serum: 943 mg/dL (ref 586–1602)
IgM (Immunoglobulin M), Srm: 30 mg/dL (ref 26–217)

## 2021-09-20 NOTE — Telephone Encounter (Signed)
PA completed on CMM/optum KEY: [id number] Will await response

## 2021-09-20 NOTE — Telephone Encounter (Signed)
Request Reference Number: [id number]. LACOSAMIDE TAB '100MG'$  is approved through 09/21/2022. Your patient may now fill this prescription and it will be covered.

## 2021-10-11 ENCOUNTER — Other Ambulatory Visit: Payer: Self-pay | Admitting: Neurology

## 2021-10-11 ENCOUNTER — Encounter: Payer: Self-pay | Admitting: Cardiology

## 2021-10-11 NOTE — Progress Notes (Unsigned)
Cardiology Office Note:    Date:  10/11/2021   ID:  Angel Rodriguez, DOB November 11, 1975, MRN [id number]  PCP:  Lorrene Reid, PA-C   [facility] Providers Cardiologist:  None { Click to update primary MD,subspecialty MD or APP then REFRESH:1}    Referring MD: Lorrene Reid, PA-C   Chief Complaint: ***  History of Present Illness:    Angel Rodriguez is a *** 46 y.o. female with a hx of diabetes, hyperlipidemia, obesity, former tobacco abuse, diabetes, CNS vasculitis  She underwent sleep study due to history of snoring and excessive daytime sleepiness. Frequent PVCs were noted during sleep study. Was having dyspnea on exertion, nonspecific chest pain. NST 12/07/20 revealed no significant ischemia, low risk study. Diastolic dysfunction noted on echo. LDL over 190, started on Lipitor with improved cholesterol. Triglycerides remained slightly elevated.   Last seen on 01/10/21 by Dr. Garen Lah, no changes to medical therapy. She was recommended to return in 6 months.  Today,     Past Medical History:  Diagnosis Date   Anxiety    Blind right eye 2009   swelling & pressure   Cluster headaches    started after seizure and migraines   Complication of anesthesia    trouble with short term memory after surgeries   COVID-19    Depression    Fibroids    s/p TLH, bilateral salpingectomy   GERD (gastroesophageal reflux disease)    patient thinks due to medications   History of anemia    prior to hysterectomy   History of pneumonia    History of trichomoniasis    Seizures ([place]) x 1 2-3 yrs ago none since   once saw neurologist, full body brain MRI and another 6 months lster   Status post right oophorectomy    Thrombocytopenia ([place])    sees [place] with Heme Onc   Vasculitis ([place])    neuro    Past Surgical History:  Procedure Laterality Date   ABDOMINAL HYSTERECTOMY     CYSTOSCOPY N/A 07/31/2017   Procedure: CYSTOSCOPY;  Surgeon: Megan Salon, MD;  Location: [facility];  Service:  Gynecology;  Laterality: N/A;   ENDOMETRIAL ABLATION     EYE SURGERY     laser - removed blood from right eye   HYSTEROSCOPY WITH D & C     LAPAROSCOPY N/A 09/19/2017   Procedure: LAPAROSCOPY DIAGNOSTIC WITH RIGHT OOPHERECTOMY, LYSIS OF ADHESIONS;  Surgeon: Salvadore Dom, MD;  Location: [facility];  Service: Gynecology;  Laterality: N/A;   OMENTECTOMY N/A 11/21/2017   Procedure: OMENTECTOMY AND PELVIC WASHINGS;  Surgeon: Isabel Caprice, MD;  Location: [facility];  Service: Gynecology;  Laterality: N/A;   ROBOTIC ASSISTED SALPINGO OOPHERECTOMY Left 11/21/2017   Procedure: XI ROBOTIC ASSISTED LEFT SALPINGO OOPHORECTOMY;  Surgeon: Isabel Caprice, MD;  Location: [facility];  Service: Gynecology;  Laterality: Left;   TOTAL LAPAROSCOPIC HYSTERECTOMY WITH SALPINGECTOMY Bilateral 07/31/2017   Procedure: TOTAL LAPAROSCOPIC HYSTERECTOMY WITH SALPINGECTOMY;  Surgeon: Megan Salon, MD;  Location: [facility];  Service: Gynecology;  Laterality: Bilateral;  20 week size uterus/ Alexis bag in room/ need 4.5 hours   TUBAL LIGATION     WISDOM TOOTH EXTRACTION      Current Medications: No outpatient medications have been marked as taking for the 10/13/21 encounter (Appointment) with Ann Maki, Lanice Schwab, NP.     Allergies:   Ivp dye [iodinated contrast media] and Bee venom   Social History   Socioeconomic History   Marital status:  Single    Spouse name: Not on file   Number of children: 3   Years of education: Not on file   Highest education level: Associate degree: occupational, Hotel manager, or vocational program  Occupational History   Not on file  Tobacco Use   Smoking status: Former    Years: 20.00    Types: Cigarettes, E-cigarettes    Quit date: 05/05/2018    Years since quitting: 3.4   Smokeless tobacco: Never  Vaping Use   Vaping Use: Former  Substance and Sexual Activity   Alcohol use: Not Currently   Drug use: No   Sexual activity: Yes    Birth control/protection: Surgical    Comment:  hysterectomy  Other Topics Concern   Not on file  Social History Narrative   Not on file   Social Determinants of Health   Financial Resource Strain: Not on file  Food Insecurity: Not on file  Transportation Needs: Not on file  Physical Activity: Not on file  Stress: Not on file  Social Connections: Not on file     Family History: The patient's ***family history includes Alcohol abuse in her father; Alcoholism in her father; Cancer in her maternal grandmother; Cirrhosis in her father; Drug abuse in her cousin; Heart disease in her maternal aunt; Sleep apnea in her mother.  ROS:   Please see the history of present illness.    *** All other systems reviewed and are negative.  Labs/Other Studies Reviewed:    The following studies were reviewed today:  Echo 01/05/21  1. Left ventricular ejection fraction, by estimation, is 60 to 65%. Left  ventricular ejection fraction by 3D volume is 68 %. Left ventricular  ejection fraction by 2D MOD biplane is 62.1 %. The left ventricle has  normal function. The left ventricle has  no regional wall motion abnormalities. Left ventricular diastolic  parameters are consistent with Grade II diastolic dysfunction  (pseudonormalization).   2. Right ventricular systolic function is normal. The right ventricular  size is normal.   3. Left atrial size was mildly dilated.   4. The mitral valve is normal in structure. No evidence of mitral valve  regurgitation.   5. The aortic valve is tricuspid. Aortic valve regurgitation is trivial.   6. The inferior vena cava is normal in size with greater than 50%  respiratory variability, suggesting right atrial pressure of 3 mmHg.    Nuclear Stress Test 12/07/20    Normal pharmacologic myocardial perfusion stress test without significant ischemia or scar.   Left ventricular function is normal (LVEF 60-65%).   Inferolateral T wave inversions noted during pharmacologic stress, which are nonspecific.   There  is no significant coronary artery calcification.  Incidental note is made of hepatic steatosis on the attenuation correction CT.   The study is low risk.    Recent Labs: 02/17/2021: TSH 1.030 09/19/2021: ALT 27; BUN 9; Creatinine, Ser 0.61; Hemoglobin 13.1; Platelets 82; Potassium 3.9; Sodium 142  Recent Lipid Panel    Component Value Date/Time   CHOL 171 02/17/2021 0845   TRIG 236 (H) 02/17/2021 0845   HDL 47 02/17/2021 0845   CHOLHDL 3.6 02/17/2021 0845   CHOLHDL 5.9 03/14/2018 0316   VLDL 58 (H) 03/14/2018 0316   LDLCALC 85 02/17/2021 0845     Risk Assessment/Calculations:   {Does this patient have ATRIAL FIBRILLATION?:[id number]}       Physical Exam:    VS:  LMP 05/02/2017     Wt Readings from Last 3  Encounters:  09/19/21 182 lb (82.6 kg)  06/27/21 175 lb (79.4 kg)  05/13/21 183 lb 6.4 oz (83.2 kg)     GEN: *** Well nourished, well developed in no acute distress HEENT: Normal NECK: No JVD; No carotid bruits CARDIAC: ***RRR, no murmurs, rubs, gallops RESPIRATORY:  Clear to auscultation without rales, wheezing or rhonchi  ABDOMEN: Soft, non-tender, non-distended MUSCULOSKELETAL:  No edema; No deformity. *** pedal pulses, ***bilaterally SKIN: Warm and dry NEUROLOGIC:  Alert and oriented x 3 PSYCHIATRIC:  Normal affect   EKG:  EKG is *** ordered today.  The ekg ordered today demonstrates ***  Diagnoses:    No diagnosis found. Assessment and Plan:     Dyspnea on exertion: Diastolic dysfunction: Hyperlipidemia:     {Are you ordering a CV Procedure (e.g. stress test, cath, DCCV, TEE, etc)?   Press F2        :[id number]}    Medication Adjustments/Labs and Tests Ordered: Current medicines are reviewed at length with the patient today.  Concerns regarding medicines are outlined above.  No orders of the defined types were placed in this encounter.  No orders of the defined types were placed in this encounter.   There are no Patient Instructions on file  for this visit.   Signed, Emmaline Life, NP  10/11/2021 8:19 PM    [facility]

## 2021-10-11 NOTE — Telephone Encounter (Signed)
Error

## 2021-10-13 ENCOUNTER — Ambulatory Visit: Payer: 59 | Admitting: Nurse Practitioner

## 2021-10-13 ENCOUNTER — Encounter: Payer: Self-pay | Admitting: Nurse Practitioner

## 2021-10-13 VITALS — BP 92/60 | HR 54 | Ht 60.0 in | Wt 184.8 lb

## 2021-10-13 DIAGNOSIS — I776 Arteritis, unspecified: Secondary | ICD-10-CM | POA: Diagnosis not present

## 2021-10-13 DIAGNOSIS — I493 Ventricular premature depolarization: Secondary | ICD-10-CM

## 2021-10-13 DIAGNOSIS — R5383 Other fatigue: Secondary | ICD-10-CM

## 2021-10-13 DIAGNOSIS — R0609 Other forms of dyspnea: Secondary | ICD-10-CM | POA: Diagnosis not present

## 2021-10-13 DIAGNOSIS — E785 Hyperlipidemia, unspecified: Secondary | ICD-10-CM

## 2021-10-13 DIAGNOSIS — I5189 Other ill-defined heart diseases: Secondary | ICD-10-CM

## 2021-10-13 LAB — LIPID PANEL
Cholesterol: 204 mg/dL — ABNORMAL HIGH (ref 0–200)
HDL: 63 mg/dL (ref >40)
LDL Cholesterol: 107 mg/dL — ABNORMAL HIGH (ref 0–99)
Total CHOL/HDL Ratio: 3.2 RATIO
Triglycerides: 168 mg/dL — ABNORMAL HIGH (ref <150)
VLDL: 34 mg/dL (ref 0–40)

## 2021-10-13 MED ORDER — VERAPAMIL HCL ER 240 MG PO TBCR: 120.0000 mg | EXTENDED_RELEASE_TABLET | Freq: Every day | ORAL | 3 refills | Status: DC

## 2021-10-13 NOTE — Patient Instructions (Signed)
Medication Instructions:  1.Decrease verapamil to 1/2 tablet daily (120 mg) *If you need a refill on your cardiac medications before your next appointment, please call your pharmacy*   Lab Work: Lipid panel today If you have labs (blood work) drawn today and your tests are completely normal, you will receive your results only by: [place] (if you have MyChart) OR A paper copy in the mail If you have any lab test that is abnormal or we need to change your treatment, we will call you to review the results.  Follow-Up: At [facility], you and your health needs are our priority.  As part of our continuing mission to provide you with exceptional heart care, we have created designated Provider Care Teams.  These Care Teams include your primary Cardiologist (physician) and Advanced Practice Providers (APPs -  Physician Assistants and Nurse Practitioners) who all work together to provide you with the care you need, when you need it.  Your next appointment:   6 month(s)  The format for your next appointment:   In Person  Provider:   Kate Sable, MD{  Other Instructions Try to obtain a blood pressure machine to check your blood pressure at home  Important Information About Sugar

## 2021-10-18 ENCOUNTER — Telehealth: Payer: Self-pay

## 2021-10-18 MED ORDER — ATORVASTATIN CALCIUM 80 MG PO TABS: 80.0000 mg | ORAL_TABLET | Freq: Every day | ORAL | 1 refills | Status: DC

## 2021-10-18 NOTE — Telephone Encounter (Signed)
-----   Message from Kate Sable, MD sent at 10/13/2021  5:37 PM EDT ----- Cholesterol improved but still elevated.  Increase Lipitor to 80 mg daily.

## 2021-10-18 NOTE — Telephone Encounter (Signed)
The patient has been notified of the result and verbalized understanding.  All questions (if any) were answered. Kavin Leech, RN 10/18/2021 11:54 AM

## 2021-10-21 ENCOUNTER — Ambulatory Visit: Payer: No Typology Code available for payment source | Admitting: Cardiology

## 2021-11-04 ENCOUNTER — Ambulatory Visit (HOSPITAL_BASED_OUTPATIENT_CLINIC_OR_DEPARTMENT_OTHER)
Admission: RE | Admit: 2021-11-04 | Discharge: 2021-11-04 | Disposition: A | Discharge status: Home / Self Care | Payer: 59 | Source: Ambulatory Visit | Attending: Obstetrics & Gynecology | Admitting: Obstetrics & Gynecology

## 2021-11-04 ENCOUNTER — Other Ambulatory Visit: Payer: Self-pay | Admitting: Physician Assistant

## 2021-11-04 ENCOUNTER — Ambulatory Visit (INDEPENDENT_AMBULATORY_CARE_PROVIDER_SITE_OTHER): Payer: 59 | Admitting: Obstetrics & Gynecology

## 2021-11-04 ENCOUNTER — Encounter (HOSPITAL_BASED_OUTPATIENT_CLINIC_OR_DEPARTMENT_OTHER): Payer: Self-pay | Admitting: Obstetrics & Gynecology

## 2021-11-04 VITALS — BP 147/76 | HR 61 | Ht 60.0 in | Wt 180.8 lb

## 2021-11-04 DIAGNOSIS — Z1231 Encounter for screening mammogram for malignant neoplasm of breast: Secondary | ICD-10-CM | POA: Insufficient documentation

## 2021-11-04 DIAGNOSIS — D3910 Neoplasm of uncertain behavior of unspecified ovary: Secondary | ICD-10-CM | POA: Diagnosis not present

## 2021-11-04 DIAGNOSIS — E119 Type 2 diabetes mellitus without complications: Secondary | ICD-10-CM

## 2021-11-08 NOTE — Progress Notes (Signed)
GYNECOLOGY  VISIT  CC:   Recheck  HPI: 46 y.o. [id number] Single Other or two or more races female here for 6 month recheck.  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  Does not desire to follow up with gyn/oncology.  Denies pelvic pain.  Denies vaginal bleeding.  Denies urinary issues.  Denies vaginal discharge.  Needs ca-125 today.  H/o TLH 07/31/2017 with salpingectomy, then laparoscopic oophorectomy 09/19/2017 and then robotic oophorectomy and omentectomy 11/21/2017.     Past Medical History:  Diagnosis Date   Anxiety    Blind right eye 2009   swelling & pressure   Cluster headaches    started after seizure and migraines   Complication of anesthesia    trouble with short term memory after surgeries   COVID-19    Depression    Fibroids    s/p TLH, bilateral salpingectomy   GERD (gastroesophageal reflux disease)    patient thinks due to medications   History of anemia    prior to hysterectomy   History of pneumonia    History of trichomoniasis    Seizures ([place]) x 1 2-3 yrs ago none since   once saw neurologist, full body brain MRI and another 6 months lster   Status post right oophorectomy    Thrombocytopenia ([place])    sees [place] with Heme Onc   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     atorvastatin (LIPITOR) 80 MG tablet Take 1 tablet (80 mg total) by mouth daily. 90 tablet 1   atropine 1 % ophthalmic solution INSTILL ONE DROP INTO THE RIGHT EYE THREE TIMES DAILY 5 mL 0   b complex vitamins capsule Take 1 capsule by mouth daily.     blood glucose meter kit and supplies Dispense based on patient and insurance preference. Use to check  fasting blood sugar and 2 hrs after largest meal. (FOR ICD-10 E10.9, E11.9). 1 each 0   brimonidine (ALPHAGAN P) 0.1 % SOLN Place 1 drop into the right eye 3 (three) times daily.      calcium carbonate (TUMS EX) 750 MG chewable tablet Chew 2 tablets by mouth daily as needed for heartburn.      clonazePAM (KLONOPIN) 1 MG tablet Take 1 tablet (1 mg total) by mouth 2 (two) times daily as needed for anxiety. 180 tablet 1   dorzolamide-timolol (COSOPT) 22.3-6.8 MG/ML ophthalmic solution Place 1 drop into the right eye 2 (two) times daily.      fluticasone (FLONASE) 50 MCG/ACT nasal spray Place 1-2 sprays into both nostrils daily as needed for allergies.      glucose blood (ACCU-CHEK GUIDE) test strip Use to check fasting glucose and 2 hours after largest meal 100 each 12   Lacosamide 100 MG TABS Take 1 tablet (100 mg total) by mouth 2 (two) times daily. 180 tablet 1   latanoprost (XALATAN) 0.005 % ophthalmic solution Place 1 drop into both eyes at bedtime.     metFORMIN (GLUCOPHAGE) 500 MG tablet TAKE 1 TABLET BY MOUTH TWICE DAILY WITH A MEAL FOR 7 DAYS AND THEN TAKE 2 TABLETS BY MOUTH TWICE DAILY  WITH A MEAL 360 tablet 1   nystatin cream (MYCOSTATIN) Apply 1 application topically 2 (two) times daily. Apply to affected area BID for up to 7 days. 30 g 1   ondansetron (ZOFRAN-ODT) 4 MG disintegrating tablet Take 1 tablet (4 mg total) by mouth every 8 (eight) hours as needed for nausea or vomiting. 20 tablet 11   SUMAtriptan (IMITREX) 100 MG tablet Take 1 tablet (100 mg total) by mouth once as needed for migraine. May repeat in 2 hours if headache persists or recurs. 9 tablet 5   TURMERIC PO Take by mouth.     verapamil (CALAN-SR) 240 MG CR tablet Take 0.5 tablets (120 mg total) by mouth at bedtime. 45 tablet 3   Vitamin D, Ergocalciferol, (DRISDOL) 1.25 MG (50000 UNIT) CAPS capsule Take 1 capsule (50,000 Units total) by mouth every 7 (seven) days. 12 capsule 0   vortioxetine HBr (TRINTELLIX) 10 MG TABS  tablet Take 1 tablet (10 mg total) by mouth daily. 90 tablet 0   No current facility-administered medications on file prior to visit.    ALLERGIES: Ivp dye [iodinated contrast media] and Bee venom  SH:  single, non smoker  Review of Systems  Constitutional: Negative.   Genitourinary: Negative.     PHYSICAL EXAMINATION:    BP (!) 147/76 (BP Location: Right Arm, Patient Position: Sitting, Cuff Size: Normal)   Pulse 61   Ht 5' (1.524 m) Comment: reported  Wt 180 lb 12.8 oz (82 kg)   LMP 05/02/2017   BMI 35.31 kg/m     General appearance: alert, cooperative and appears stated age Abdomen: soft, non-tender; bowel sounds normal; no masses,  no organomegaly Lymph:  no inguinal LAD noted  Pelvic: External genitalia:  no lesions              Urethra:  normal appearing urethra with no masses, tenderness or lesions              Bartholins and Skenes: normal                 Vagina: normal appearing vagina with normal color and discharge, no lesions              Cervix: absent              Bimanual Exam:  Uterus:  uterus absent              Adnexa: no mass, fullness, tenderness              Rectovaginal: Yes.  .  Confirms.              Anus:  normal sphincter tone, no lesions  Chaperone, Octaviano Batty, CMA, was present for exam.  Assessment/Plan: 1. 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 Encounter for screening mammogram for malignant neoplasm of breast - MM 3D SCREEN BREAST BILATERAL; Future

## 2021-11-09 ENCOUNTER — Other Ambulatory Visit: Payer: Self-pay

## 2021-11-09 NOTE — Telephone Encounter (Signed)
Error

## 2021-11-11 LAB — CA 125: Cancer Antigen (CA) 125: 17.7 U/mL (ref 0.0–38.1)

## 2021-12-24 ENCOUNTER — Other Ambulatory Visit: Payer: Self-pay | Admitting: Neurology

## 2021-12-26 ENCOUNTER — Other Ambulatory Visit: Payer: Self-pay

## 2021-12-26 MED ORDER — ATROPINE SULFATE 1 % OP SOLN: 1.0000 [drp] | Freq: Three times a day (TID) | OPHTHALMIC | 0 refills | Status: DC

## 2022-01-11 ENCOUNTER — Telehealth: Payer: Self-pay | Admitting: *Deleted

## 2022-01-11 NOTE — Telephone Encounter (Signed)
Spoke w/ Kim/[facility]. Pt received free drug for Rituxan. Only has to pay for administration cost. They have been trying to set up payment plan w/ pt. Last they spoke w/ pt she ended call after trying to discuss plan for payment plan. Dr. Felecia Shelling aware per Maudie Mercury.

## 2022-03-20 ENCOUNTER — Telehealth: Payer: Self-pay | Admitting: *Deleted

## 2022-03-20 DIAGNOSIS — E119 Type 2 diabetes mellitus without complications: Secondary | ICD-10-CM

## 2022-03-20 MED ORDER — METFORMIN HCL 500 MG PO TABS: 500.0000 mg | ORAL_TABLET | Freq: Two times a day (BID) | ORAL | 0 refills | Status: DC

## 2022-03-20 NOTE — Telephone Encounter (Signed)
30 day sent 

## 2022-03-20 NOTE — Telephone Encounter (Signed)
Pt called requesting refill on below, informed her that she has not had an appointment since 2022 and we would need to schedule that and then I would send a message to nursing staff to see about getting refill until that appointment time.       metFORMIN (GLUCOPHAGE) 500 MG tablet   [facility] [address] [place], [place].    LOV:10/21/2020 ROV: 04/24/22

## 2022-03-22 ENCOUNTER — Ambulatory Visit: Payer: BLUE CROSS/BLUE SHIELD | Admitting: Neurology

## 2022-03-22 ENCOUNTER — Encounter: Payer: Self-pay | Admitting: Neurology

## 2022-03-22 VITALS — BP 115/70 | HR 70 | Ht 60.0 in | Wt 175.0 lb

## 2022-03-22 DIAGNOSIS — G4734 Idiopathic sleep related nonobstructive alveolar hypoventilation: Secondary | ICD-10-CM | POA: Diagnosis not present

## 2022-03-22 DIAGNOSIS — Z79899 Other long term (current) drug therapy: Secondary | ICD-10-CM

## 2022-03-22 DIAGNOSIS — R569 Unspecified convulsions: Secondary | ICD-10-CM

## 2022-03-22 DIAGNOSIS — G43711 Chronic migraine without aura, intractable, with status migrainosus: Secondary | ICD-10-CM

## 2022-03-22 DIAGNOSIS — I776 Arteritis, unspecified: Secondary | ICD-10-CM | POA: Diagnosis not present

## 2022-03-22 NOTE — Progress Notes (Signed)
[facility]  PATIENT: Angel Rodriguez DOB: 05-07-1975  REFERRING DOCTOR OR PCP:   SOURCE: patient, ED records, images on PACS, reports in EMR  _________________________________   HISTORICAL  CHIEF COMPLAINT:  Chief Complaint  Patient presents with   Room 11    Pt is here Alone. Pt states that things have been about the same.Pt states that she has been getting headaches and pain in her right eye.     HISTORY OF PRESENT ILLNESS:  [name] is a 47 y.o. woman with frequent headaches and h/o seizure.  Update 03/22/2022: She is on Rituxan for CNS vasculitis.  She has done well and has less eye pain/pressure.   THe current generic is better tolerated than the first.    HA's are right > left sided and sometimes she has visua chanes.  .    Right eye are not red now but have been off/on the past month  She feels her headaches were doing better last year and have acted back up again over the last few months.    When HA's are right-sided and more severe, sphenopalatine ganglion blocks have been very helpful   She denies any visual changes.   She has numbness in hr hands but no weakness.  The numbness is worse on the right.   It is in the palms more than dorsum.  Shaking her arms help.       She has no recent seizure.   First one was 2016 and she had several when med's were withdrawn so was restarted.  Her last one was April 2021.  She is on Vimpat.    She is on Trintellix and clonazepam for anxiety and depression.   Mood was doing better last year than this year.  She has no OSA on PSG 10/20/2020   The study found nocturnal hypoxemia and frequent bigeminy/trigeminy.    Nocturia O2 was started and she feels better.   She saw pulmonary.    PFTs were normal.      She saw cardiology.   Echo showed mild diastoic dysfunction.    Placed on a statin.     PSG 10/20/2021: No significant OSA was noted 44% of the night was with SaO2 80-89%     ---- nocturnal O2  recommended. 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  She had a second opinion from [place].  Changes in her seizure medications were made but no opinion about the possibility of vasculitis.  She was placed on prednisone 60 mg and titrating down in July 2021.  She had improvement of headaches and other symptoms.  MRI 10/15/2019 showed:   "Resolution of a region of T2 and FLAIR signal within the posterior frontal cortical and subcortical brain on the study of  May. One could question slight increased prominence of a T2 and FLAIR focus without enhancement in the subcortical brain at the bottom of a left frontal sulcus, axial FLAIR image 19. No other new or potentially progressive finding. Minimal small foci of cortical and subcortical enhancement in the left frontal region. I think the differential diagnosis for this case remains that of a vasculitis syndrome, venous thrombotic syndrome, autoimmune syndrome or demyelinating syndrome. I do not think we are dealing with tumor or arterial infarctions. "  MRI brain 07/06/2018 personally reviewed and compared to studies from January 2020.  It shows 2 foci in the anterior left frontal lobe.  One was mildly increased in size compared to the January MRI and one was decreased in size.  Another focus seen on the previous MRI in the left parasagittal  frontal lobe has resolved.  MRI of the cervical and thoracic spine 03/25/2018 showed normal spinal cord and mild cervical degenerative changes.  Labs: CSF 03/25/2018 showed 10 white blood cells (lymphocytes), 3 red cells, normal glucose, protein oligoclonal bands, VDRL.  In 03/2018, negative ANA, cANCA and pANCA, weakly positive ANCA proteinase 3, normal ACE level. ESR 10.  HIV was negative 09/22/2017.  Hepatitis C was negative 04/2016     REVIEW OF SYSTEMS: Constitutional: No fevers, chills, sweats, or change in appetite.  She has insomnia. Eyes: as above Ear, nose and throat: No hearing loss, ear pain, nasal congestion, sore throat Cardiovascular: No chest pain, palpitations Respiratory:  No shortness of breath at rest or with exertion.   No wheezes GastrointestinaI: No nausea, vomiting, diarrhea, abdominal pain, fecal incontinence Genitourinary:  No dysuria, urinary retention or frequency.  No nocturia.   Possible ovarian cancer Musculoskeletal:  No neck pain, back pain Integumentary: No rash, pruritus, skin lesions Neurological: as above Psychiatric: No depression at this time.  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 Allergic/Immunologic: No itchy/runny eyes, nasal congestion, recent allergic reactions, rashes  ALLERGIES: Allergies  Allergen Reactions   Ivp Dye [Iodinated Contrast Media] Hives, Itching and Swelling   Bee Venom Swelling    HOME MEDICATIONS:  Current Outpatient Medications:    Accu-Chek Softclix Lancets lancets, Use as instructed, Disp: 100 each, Rfl: 12   acetaminophen-codeine (TYLENOL #3) 300-30 MG tablet, One po up to qid prn, Disp: 28 tablet, Rfl: 2   Ascorbic Acid (VITAMIN C) 1000 MG tablet, Take 1,000 mg by mouth daily., Disp: , Rfl:    atorvastatin  (LIPITOR) 80 MG tablet, Take 1 tablet (80 mg total) by mouth daily., Disp: 90 tablet, Rfl: 1   atropine 1 % ophthalmic solution, Place 1 drop into the right eye 3 (three) times daily., Disp: 2 mL, Rfl: 0   b complex vitamins capsule, Take 1 capsule by mouth daily., Disp: , Rfl:    Blood Glucose Monitoring Suppl (ACCU-CHEK GUIDE ME) w/Device KIT, USE TO CHECK FASTING BLOOD SUGAR AND 2 HOURS AFTER LARGEST MEAL, Disp: 1 kit, Rfl: 0   brimonidine (ALPHAGAN P) 0.1 % SOLN, Place 1 drop into the right eye 3 (three) times daily. , Disp: , Rfl:    calcium carbonate (TUMS EX) 750 MG chewable tablet, Chew 2 tablets by mouth daily as needed for heartburn. , Disp: , Rfl:    clonazePAM (KLONOPIN) 1 MG tablet, Take 1 tablet (1 mg total) by mouth 2 (two) times  daily as needed for anxiety., Disp: 180 tablet, Rfl: 1   dorzolamide-timolol (COSOPT) 22.3-6.8 MG/ML ophthalmic solution, Place 1 drop into the right eye 2 (two) times daily. , Disp: , Rfl:    fluticasone (FLONASE) 50 MCG/ACT nasal spray, Place 1-2 sprays into both nostrils daily as needed for allergies. , Disp: , Rfl:    glucose blood (ACCU-CHEK GUIDE) test strip, Use to check fasting glucose and 2 hours after largest meal, Disp: 100 each, Rfl: 12   Lacosamide 100 MG TABS, Take 1 tablet (100 mg total) by mouth 2 (two) times daily., Disp: 180 tablet, Rfl: 1   latanoprost (XALATAN) 0.005 % ophthalmic solution, Place 1 drop into both eyes at bedtime., Disp: , Rfl:    metFORMIN (GLUCOPHAGE) 500 MG tablet, Take 1 tablet (500 mg total) by mouth 2 (two) times daily with a meal., Disp: 60 tablet, Rfl: 0   nystatin cream (MYCOSTATIN), Apply 1 application topically 2 (two) times daily. Apply to affected area BID for up to 7 days., Disp: 30 g, Rfl: 1   ondansetron (ZOFRAN-ODT) 4 MG disintegrating tablet, Take 1 tablet (4 mg total) by mouth every 8 (eight) hours as needed for nausea or vomiting., Disp: 20 tablet, Rfl: 11   SUMAtriptan (IMITREX) 100 MG tablet, Take 1  tablet (100 mg total) by mouth once as needed for migraine. May repeat in 2 hours if headache persists or recurs., Disp: 9 tablet, Rfl: 5   TURMERIC PO, Take by mouth., Disp: , Rfl:    verapamil (CALAN-SR) 240 MG CR tablet, Take 0.5 tablets (120 mg total) by mouth at bedtime., Disp: 45 tablet, Rfl: 3   Vitamin D, Ergocalciferol, (DRISDOL) 1.25 MG (50000 UNIT) CAPS capsule, Take 1 capsule (50,000 Units total) by mouth every 7 (seven) days., Disp: 12 capsule, Rfl: 0   vortioxetine HBr (TRINTELLIX) 10 MG TABS tablet, Take 1 tablet (10 mg total) by mouth daily., Disp: 90 tablet, Rfl: 0  PAST MEDICAL HISTORY: Past Medical History:  Diagnosis Date   Anxiety    Blind right eye 2009   swelling & pressure   Cluster headaches    started after seizure and migraines   Complication of anesthesia    trouble with short term memory after surgeries   COVID-19    Depression    Fibroids    s/p TLH, bilateral salpingectomy   GERD (gastroesophageal reflux disease)    patient thinks due to medications   History of anemia    prior to hysterectomy   History of pneumonia    History of trichomoniasis    Seizures ([place]) x 1 2-3 yrs ago none since   once saw neurologist, full body brain MRI and another 6 months lster   Status post right oophorectomy    Thrombocytopenia ([place])    sees [place] with Heme Onc   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    PAST SURGICAL HISTORY: Past Surgical History:  Procedure Laterality Date   CYSTOSCOPY N/A 07/31/2017   Procedure: CYSTOSCOPY;  Surgeon: Megan Salon, MD;  Location: [facility];  Service: Gynecology;  Laterality: N/A;   ENDOMETRIAL ABLATION     EYE SURGERY     laser - removed blood from right eye   HYSTEROSCOPY WITH D & C     LAPAROSCOPY N/A 09/19/2017   Procedure: LAPAROSCOPY DIAGNOSTIC WITH RIGHT OOPHERECTOMY, LYSIS OF ADHESIONS;  Surgeon: Salvadore Dom, MD;  Location: [facility];  Service: Gynecology;  Laterality: N/A;   OMENTECTOMY N/A 11/21/2017   Procedure:  OMENTECTOMY AND PELVIC WASHINGS;  Surgeon: Isabel Caprice, MD;  Location: [facility];  Service: Gynecology;  Laterality: N/A;   ROBOTIC ASSISTED SALPINGO OOPHERECTOMY Left 11/21/2017   Procedure: XI ROBOTIC ASSISTED LEFT SALPINGO OOPHORECTOMY;  Surgeon: Isabel Caprice, MD;  Location: [facility];  Service: Gynecology;  Laterality: Left;   TOTAL LAPAROSCOPIC HYSTERECTOMY WITH SALPINGECTOMY Bilateral 07/31/2017   Procedure: TOTAL LAPAROSCOPIC HYSTERECTOMY WITH SALPINGECTOMY;  Surgeon: Megan Salon, MD;  Location: [facility];  Service: Gynecology;  Laterality: Bilateral;  20 week size uterus/ Alexis bag in room/ need 4.5 hours   TUBAL LIGATION     WISDOM TOOTH EXTRACTION      FAMILY HISTORY: Family History  Problem Relation Age of Onset   Sleep apnea Mother    Alcoholism Father    Cirrhosis Father    Alcohol abuse Father    Cancer Maternal Grandmother        unsure of type; died of other causes   Heart disease Maternal Aunt    Drug abuse Cousin     SOCIAL HISTORY:  Social History   Socioeconomic History   Marital status: Single    Spouse name: Not on file   Number of children: 3   Years of education: Not on file   Highest education level: Associate degree: occupational, Hotel manager, or vocational program  Occupational History   Not on file  Tobacco Use   Smoking status: Former    Years: 20.00    Types: Cigarettes, E-cigarettes    Quit date: 05/05/2018    Years since quitting: 3.8   Smokeless tobacco: Never  Vaping Use   Vaping Use: Former  Substance and Sexual Activity   Alcohol use: Not Currently   Drug use: No   Sexual activity: Yes    Birth control/protection: Surgical    Comment: hysterectomy  Other Topics Concern   Not on file  Social History Narrative   Not on file   Social Determinants of Health   Financial Resource Strain: Not on file  Food Insecurity: Not on file  Transportation Needs: Not on file  Physical Activity: Not on file  Stress: Not on file  Social  Connections: Not on file  Intimate Partner Violence: Not on file     PHYSICAL EXAM  Vitals:   03/22/22 1149  BP: 115/70  Pulse: 70  Weight: 175 lb (79.4 kg)  Height: 5' (1.524 m)    Body mass index is 34.18 kg/m.   General: The patient is well-developed and well-nourished and in no acute distress.  Mallampati 2  Eyes:   She has mild conjunctival erythema on the right..    Neck: T she has mild tenderness over the right occiput and splenius capitis.  Range of motion is fairly normal in the neck.  Neurologic Exam  Mental status: The patient is alert and oriented x 3 at the time of the examination. The patient has apparent normal recent and remote memory, with an apparently normal attention span and concentration ability.   Speech is normal.  Cranial nerves: Extraocular movements are full.  She has very mild right ptosis and facial strength is normal elsewhere.  There is normal facial sensation to soft touch.   Trapezius and sternocleidomastoid strength is normal. No dysarthria is noted.  Hearing is symmetric.  Motor:  Muscle bulk is normal.   Tone is normal. Strength is  5 / 5 in all 4 extremities.   Sensory: She has normal sensation to touch and vibration in the arms  and legs   Gait and station: Station is normal.   Gait is normal and the tandem gait is mildly wide.  Romberg negative  DTRs:  Normal and symmetric    DIAGNOSTIC DATA (LABS, IMAGING, TESTING) - I reviewed patient records, labs, notes, testing and imaging myself where available.  Lab Results  Component Value Date   WBC 5.7 09/19/2021   HGB 13.1 09/19/2021   HCT 38.7 09/19/2021   MCV 87 09/19/2021   PLT 82 (LL) 09/19/2021      Component Value Date/Time   NA 142 09/19/2021 1209   NA 138 08/31/2016 0957   K 3.9 09/19/2021 1209   K 3.9 08/31/2016 0957   CL 105 09/19/2021 1209   CO2 23 09/19/2021 1209   CO2 22 08/31/2016 0957   GLUCOSE 98 09/19/2021 1209   GLUCOSE 148 (H) 07/10/2018 0915   GLUCOSE  106 08/31/2016 0957   BUN 9 09/19/2021 1209   BUN 8.6 08/31/2016 0957   CREATININE 0.61 09/19/2021 1209   CREATININE 0.8 08/31/2016 0957   CALCIUM 9.9 09/19/2021 1209   CALCIUM 10.3 08/31/2016 0957   PROT 7.3 09/19/2021 1209   PROT 7.6 08/31/2016 0957   ALBUMIN 4.8 09/19/2021 1209   ALBUMIN 3.9 08/31/2016 0957   AST 17 09/19/2021 1209   AST 18 08/31/2016 0957   ALT 27 09/19/2021 1209   ALT 23 08/31/2016 0957   ALKPHOS 81 09/19/2021 1209   ALKPHOS 62 08/31/2016 0957   BILITOT 0.3 09/19/2021 1209   BILITOT 0.43 08/31/2016 0957   GFRNONAA 111 02/04/2019 0902   GFRAA 128 02/04/2019 0902         PROCEDURE   The patient was placed in the supine position.  A 20g catheter was lubricated with gel, and placed in the right naris. The catheter was inserted above the middle turbinate to the posterior nasal cavity. .  2 mL of 2% lidocaine was placed. The patient was asked to swallow during the injection. The patient demonstrated erythema of the sclera of the eye on this side, and tearing.   The patient tolerated the procedure well. No complications of the procedure were noted. The patient was kept in the supine position for 2-3 minutes following the procedure. She was given small sips of water after sitting up following the procedure.      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   Continue Rituxan for CNS vaculitis   check labs 2.   Continue Vimpat for seizures.   Continue Trintellix/clonazepam for mood 3.    She has nocturnal hypoxemia.   We can start 2L O2 at night.  Will refer to pulmonology to determine if pulmonary disease 4.   Right sphenopalatine ganglion block with 2 cc of lidocaine.  Pain was  much better afterwards.   5.   return in 6 months.  She will call if she has new or worsening symptoms.    Sheleen Conchas A. Felecia Shelling, MD, PhD 06/27/5359, 44:31 PM Certified in Neurology, Clinical Neurophysiology, Sleep Medicine, Pain Medicine and Neuroimaging   [facility] [address], [place], [place] [address] [phone]

## 2022-03-30 ENCOUNTER — Other Ambulatory Visit: Payer: Self-pay | Admitting: Nurse Practitioner

## 2022-03-30 ENCOUNTER — Other Ambulatory Visit: Payer: Self-pay | Admitting: Neurology

## 2022-03-30 DIAGNOSIS — E119 Type 2 diabetes mellitus without complications: Secondary | ICD-10-CM

## 2022-03-30 DIAGNOSIS — F419 Anxiety disorder, unspecified: Secondary | ICD-10-CM

## 2022-04-04 NOTE — Telephone Encounter (Signed)
Pt last seen 03/22/22 and next f/u 09/27/22.   Per drug registry, last refilled Vimpat 03/03/22 #60 and clonazepam 12/25/21 #180.

## 2022-04-05 LAB — ANTIPHOSPHOLIPID SYNDROME COMP
APTT: 25.4 s
Anticardiolipin Ab, IgA: <10 [APL'U]
Anticardiolipin Ab, IgG: <10 [GPL'U]
Anticardiolipin Ab, IgM: <10 [MPL'U]
Antiphosphatidylserine IgG: 0 {GPS'U}
Antiphosphatidylserine IgM: 0 {MPS'U}
Antiprothrombin Antibody, IgG: 1 G units
Beta-2 Glycoprotein I, IgA: <10 SAU
Beta-2 Glycoprotein I, IgG: <10 SGU
Beta-2 Glycoprotein I, IgM: <10 SMU
DRVVT Screen Seconds: 35.5 s
Hexagonal Phospholipid Neutral: 4 s
Platelet Neutralization: 0 s

## 2022-04-05 LAB — CBC WITH DIFFERENTIAL/PLATELET
Basophils Absolute: 0 10*3/uL (ref 0.0–0.2)
Basos: 1 %
EOS (ABSOLUTE): 0.1 10*3/uL (ref 0.0–0.4)
Eos: 2 %
Hematocrit: 41.5 % (ref 34.0–46.6)
Hemoglobin: 13.8 g/dL (ref 11.1–15.9)
Immature Grans (Abs): 0 10*3/uL (ref 0.0–0.1)
Immature Granulocytes: 0 %
Lymphocytes Absolute: 1.6 10*3/uL (ref 0.7–3.1)
Lymphs: 28 %
MCH: 28.6 pg (ref 26.6–33.0)
MCHC: 33.3 g/dL (ref 31.5–35.7)
MCV: 86 fL (ref 79–97)
Monocytes Absolute: 0.3 10*3/uL (ref 0.1–0.9)
Monocytes: 6 %
Neutrophils Absolute: 3.5 10*3/uL (ref 1.4–7.0)
Neutrophils: 63 %
Platelets: 105 10*3/uL — ABNORMAL LOW (ref 150–450)
RBC: 4.82 x10E6/uL (ref 3.77–5.28)
RDW: 12.8 % (ref 11.7–15.4)
WBC: 5.6 10*3/uL (ref 3.4–10.8)

## 2022-04-05 LAB — IGG, IGA, IGM
IgA/Immunoglobulin A, Serum: 338 mg/dL (ref 87–352)
IgG (Immunoglobin G), Serum: 947 mg/dL (ref 586–1602)
IgM (Immunoglobulin M), Srm: 25 mg/dL — ABNORMAL LOW (ref 26–217)

## 2022-04-24 ENCOUNTER — Ambulatory Visit (INDEPENDENT_AMBULATORY_CARE_PROVIDER_SITE_OTHER): Payer: Self-pay | Admitting: Nurse Practitioner

## 2022-04-24 ENCOUNTER — Encounter: Payer: Self-pay | Admitting: Nurse Practitioner

## 2022-04-24 VITALS — BP 99/64 | HR 67 | Ht 60.0 in | Wt 180.4 lb

## 2022-04-24 DIAGNOSIS — G43711 Chronic migraine without aura, intractable, with status migrainosus: Secondary | ICD-10-CM

## 2022-04-24 DIAGNOSIS — E782 Mixed hyperlipidemia: Secondary | ICD-10-CM

## 2022-04-24 DIAGNOSIS — E119 Type 2 diabetes mellitus without complications: Secondary | ICD-10-CM

## 2022-04-24 DIAGNOSIS — F411 Generalized anxiety disorder: Secondary | ICD-10-CM

## 2022-04-24 DIAGNOSIS — Z1211 Encounter for screening for malignant neoplasm of colon: Secondary | ICD-10-CM

## 2022-04-24 DIAGNOSIS — E785 Hyperlipidemia, unspecified: Secondary | ICD-10-CM

## 2022-04-24 LAB — POCT UA - MICROALBUMIN
Albumin/Creatinine Ratio, Urine, POC: 30
Creatinine, POC: 100 mg/dL
Microalbumin Ur, POC: 30 mg/L

## 2022-04-24 LAB — POCT GLYCOSYLATED HEMOGLOBIN (HGB A1C): HbA1c POC (<> result, manual entry): 6.2 % (ref 4.0–5.6)

## 2022-04-24 MED ORDER — METFORMIN HCL 500 MG PO TABS: 500.0000 mg | ORAL_TABLET | Freq: Two times a day (BID) | ORAL | 2 refills | Status: DC

## 2022-04-24 MED ORDER — FREESTYLE LIBRE 3 SENSOR MISC: 5 refills | Status: DC

## 2022-04-24 NOTE — Progress Notes (Signed)
Established patient visit   Patient: Angel Rodriguez   DOB: 02/28/1976   47 y.o. Female  MRN: [id number] Visit Date: 04/24/2022   Chief Complaint  Patient presents with   Follow-up   Diabetes   Subjective    Diabetes Hypoglycemia symptoms include headaches and nervousness/anxiousness. Pertinent negatives for hypoglycemia include no dizziness. Pertinent negatives for diabetes include no chest pain, no fatigue, no polydipsia, no polyuria and no weakness.    Follow up  -type 2 diabetes  -HgbA1c 6.2 today  -normal microalbumin  Long history of migraine headaches --sees neurology  --gets injections for the headaches which typically work well.  -verapamil dosing lowered at her last visit with GYN provider due to leg cramps and restlessness.  --doing well with this change. Blood pressure well controlled.     Medications: Outpatient Medications Prior to Visit  Medication Sig   Accu-Chek Softclix Lancets lancets Use as instructed   acetaminophen-codeine (TYLENOL #3) 300-30 MG tablet TAKE ONE TABLET BY MOUTH UP TO FOUR TIMES A DAY AS NEEDED   Ascorbic Acid (VITAMIN C) 1000 MG tablet Take 1,000 mg by mouth daily.   atorvastatin (LIPITOR) 80 MG tablet Take 1 tablet (80 mg total) by mouth daily.   atropine 1 % ophthalmic solution INSTILL ONE DROP INTO THE RIGHT EYE THREE TIMES DAILY   b complex vitamins capsule Take 1 capsule by mouth daily.   Blood Glucose Monitoring Suppl (ACCU-CHEK GUIDE ME) w/Device KIT USE TO CHECK FASTING BLOOD SUGAR AND 2 HOURS AFTER LARGEST MEAL   brimonidine (ALPHAGAN P) 0.1 % SOLN Place 1 drop into the right eye 3 (three) times daily.    calcium carbonate (TUMS EX) 750 MG chewable tablet Chew 2 tablets by mouth daily as needed for heartburn.    clonazePAM (KLONOPIN) 1 MG tablet TAKE ONE TABLET BY MOUTH TWICE A DAY AS NEEDED FOR ANXIETY   dorzolamide-timolol (COSOPT) 22.3-6.8 MG/ML ophthalmic solution Place 1 drop into the right eye 2 (two) times daily.    glucose  blood (ACCU-CHEK GUIDE) test strip Use to check fasting glucose and 2 hours after largest meal   Lacosamide 100 MG TABS TAKE ONE TABLET BY MOUTH TWICE A DAY   latanoprost (XALATAN) 0.005 % ophthalmic solution Place 1 drop into both eyes at bedtime.   nystatin cream (MYCOSTATIN) Apply 1 application topically 2 (two) times daily. Apply to affected area BID for up to 7 days.   ondansetron (ZOFRAN-ODT) 4 MG disintegrating tablet Take 1 tablet (4 mg total) by mouth every 8 (eight) hours as needed for nausea or vomiting.   SUMAtriptan (IMITREX) 100 MG tablet Take 1 tablet (100 mg total) by mouth once as needed for migraine. May repeat in 2 hours if headache persists or recurs.   TURMERIC PO Take by mouth.   verapamil (CALAN-SR) 240 MG CR tablet TAKE ONE TABLET BY MOUTH AT BEDTIME   Vitamin D, Ergocalciferol, (DRISDOL) 1.25 MG (50000 UNIT) CAPS capsule Take 1 capsule (50,000 Units total) by mouth every 7 (seven) days.   vortioxetine HBr (TRINTELLIX) 10 MG TABS tablet Take 1 tablet (10 mg total) by mouth daily.   [DISCONTINUED] fluticasone (FLONASE) 50 MCG/ACT nasal spray Place 1-2 sprays into both nostrils daily as needed for allergies.    [DISCONTINUED] metFORMIN (GLUCOPHAGE) 500 MG tablet Take 1 tablet (500 mg total) by mouth 2 (two) times daily with a meal.   busPIRone (BUSPAR) 15 MG tablet Take 1 tablet twice a day by oral route.   No facility-administered medications  prior to visit.    Review of Systems  Constitutional:  Negative for activity change, appetite change, chills, fatigue and fever.  HENT:  Negative for congestion, postnasal drip, rhinorrhea, sinus pressure, sinus pain, sneezing and sore throat.   Eyes: Negative.   Respiratory:  Negative for cough, chest tightness, shortness of breath and wheezing.   Cardiovascular:  Negative for chest pain and palpitations.  Gastrointestinal:  Negative for abdominal pain, constipation, diarrhea, nausea and vomiting.  Endocrine: Negative for cold  intolerance, heat intolerance, polydipsia and polyuria.       Blood sugars doing well    Genitourinary:  Negative for dyspareunia, dysuria, flank pain, frequency and urgency.  Musculoskeletal:  Negative for arthralgias, back pain and myalgias.  Skin:  Negative for rash.  Allergic/Immunologic: Negative for environmental allergies.  Neurological:  Positive for headaches. Negative for dizziness and weakness.  Hematological:  Negative for adenopathy.  Psychiatric/Behavioral:  Positive for dysphoric mood and sleep disturbance. The patient is nervous/anxious.        Stable with current medication     Last CBC Lab Results  Component Value Date   WBC 5.6 03/22/2022   HGB 13.8 03/22/2022   HCT 41.5 03/22/2022   MCV 86 03/22/2022   MCH 28.6 03/22/2022   RDW 12.8 03/22/2022   PLT 105 (L) 61/95/0932   Last metabolic panel Lab Results  Component Value Date   GLUCOSE 98 09/19/2021   NA 142 09/19/2021   K 3.9 09/19/2021   CL 105 09/19/2021   CO2 23 09/19/2021   BUN 9 09/19/2021   CREATININE 0.61 09/19/2021   EGFR 111 09/19/2021   CALCIUM 9.9 09/19/2021   PROT 7.3 09/19/2021   ALBUMIN 4.8 09/19/2021   LABGLOB 2.5 09/19/2021   AGRATIO 1.9 09/19/2021   BILITOT 0.3 09/19/2021   ALKPHOS 81 09/19/2021   AST 17 09/19/2021   ALT 27 09/19/2021   ANIONGAP 10 07/10/2018   Last lipids Lab Results  Component Value Date   CHOL 204 (H) 10/13/2021   HDL 63 10/13/2021   LDLCALC 107 (H) 10/13/2021   TRIG 168 (H) 10/13/2021   CHOLHDL 3.2 10/13/2021   Last hemoglobin A1c Lab Results  Component Value Date   HGBA1C 6.2 04/24/2022   Last thyroid functions Lab Results  Component Value Date   TSH 1.030 02/17/2021   Last vitamin D Lab Results  Component Value Date   VD25OH 12.6 (L) 07/09/2020       Objective     Today's Vitals   04/24/22 1541  BP: 99/64  Pulse: 67  SpO2: 97%  Weight: 180 lb 6.4 oz (81.8 kg)  Height: 5' (1.524 m)   Body mass index is 35.23 kg/m.  BP  Readings from Last 3 Encounters:  05/22/22 124/73  04/24/22 99/64  03/22/22 115/70    Wt Readings from Last 3 Encounters:  05/22/22 177 lb 12.8 oz (80.6 kg)  04/24/22 180 lb 6.4 oz (81.8 kg)  03/22/22 175 lb (79.4 kg)    Physical Exam Vitals and nursing note reviewed.  Constitutional:      Appearance: Normal appearance. She is well-developed.  HENT:     Head: Normocephalic and atraumatic.     Nose: Nose normal.  Eyes:     Extraocular Movements: Extraocular movements intact.     Conjunctiva/sclera: Conjunctivae normal.     Pupils: Pupils are equal, round, and reactive to light.  Cardiovascular:     Rate and Rhythm: Normal rate and regular rhythm.     Pulses:  Normal pulses.     Heart sounds: Normal heart sounds.  Pulmonary:     Effort: Pulmonary effort is normal.     Breath sounds: Normal breath sounds.  Abdominal:     Palpations: Abdomen is soft.  Musculoskeletal:        General: Normal range of motion.     Cervical back: Normal range of motion and neck supple.  Lymphadenopathy:     Cervical: No cervical adenopathy.  Skin:    General: Skin is warm and dry.     Capillary Refill: Capillary refill takes less than 2 seconds.  Neurological:     General: No focal deficit present.     Mental Status: She is alert and oriented to person, place, and time.  Psychiatric:        Mood and Affect: Mood normal.        Behavior: Behavior normal.        Thought Content: Thought content normal.        Judgment: Judgment normal.     Results for orders placed or performed in visit on 04/24/22  POCT glycosylated hemoglobin (Hb A1C)  Result Value Ref Range   Hemoglobin A1C     HbA1c POC (<> result, manual entry) 6.2 4.0 - 5.6 %   HbA1c, POC (prediabetic range)     HbA1c, POC (controlled diabetic range)    POCT UA - Microalbumin  Result Value Ref Range   Microalbumin Ur, POC 30 mg/L   Creatinine, POC 100 mg/dL   Albumin/Creatinine Ratio, Urine, POC 30     Assessment & Plan     1. Type 2 diabetes mellitus without complication, without long-term current use of insulin (HCC) HgbA1c 6.2 today with normal urine microalbumin. Continue metformin as prescribed. Recheck HgbA1c in 4 months.  - POCT glycosylated hemoglobin (Hb A1C) - POCT UA - Microalbumin - metFORMIN (GLUCOPHAGE) 500 MG tablet; Take 1 tablet (500 mg total) by mouth 2 (two) times daily with a meal.  Dispense: 60 tablet; Refill: 2  2. Mixed hyperlipidemia Continue atorvastatin as prescribed  - POCT glycosylated hemoglobin (Hb A1C) - POCT UA - Microalbumin  3. Intractable chronic migraine without aura and with status migrainosus Generally stable. Continue regular visits with neurology as scheduled  4. GAD (generalized anxiety disorder) Continue trintellix and buspirone as prescribed  5. Screening for colon cancer Order placed for Cologuard screening for colon cancer  - Cologuard; Future - Cologuard    Problem List Items Addressed This Visit       Cardiovascular and Mediastinum   Intractable chronic migraine without aura and with status migrainosus     Endocrine   Type 2 diabetes mellitus without complication, without long-term current use of insulin (HCC) - Primary   Relevant Medications   metFORMIN (GLUCOPHAGE) 500 MG tablet   Other Relevant Orders   POCT glycosylated hemoglobin (Hb A1C) (Completed)   POCT UA - Microalbumin (Completed)     Other   GAD (generalized anxiety disorder)   Relevant Medications   busPIRone (BUSPAR) 15 MG tablet   Mixed hyperlipidemia   Relevant Orders   POCT glycosylated hemoglobin (Hb A1C) (Completed)   POCT UA - Microalbumin (Completed)   Other Visit Diagnoses     Screening for colon cancer       Relevant Orders   Cologuard        Return in about 4 months (around 08/23/2022) for diabetes with HgbA1c check. she is ok to stay with me. [name]  Ronnell Freshwater, NP  [facility] [phone] (phone) [phone]  (fax)  [place]

## 2022-05-16 ENCOUNTER — Telehealth: Payer: Self-pay | Admitting: Neurology

## 2022-05-16 NOTE — Telephone Encounter (Signed)
Pt called stating that her head is hurting a lot and is wanting to know if she can get a Nerve Block. Please advise.

## 2022-05-16 NOTE — Telephone Encounter (Addendum)
Per Dr. Felecia Shelling, ok to offer 8am tomorrow morning instead for pt. I called pt. She declined. She has to work in [employer] and unable to come during these days. Requested a Fri/Mon appt. I placed on hold and spoke with Dr. Felecia Shelling. He agreed to work her in 05/22/22 at 3pm. Pt accepted this appt date/time and was scheduled. She will check in at 2:30pm.

## 2022-05-16 NOTE — Telephone Encounter (Signed)
Last saw MD 03/22/22. Called pt back. She states nerve block in head not done at last appt and she feels she needs this due to increased pain she has been having. This has been beneficial in the past. Aware I will speak with MD and call her back.

## 2022-05-22 ENCOUNTER — Ambulatory Visit (INDEPENDENT_AMBULATORY_CARE_PROVIDER_SITE_OTHER): Payer: BLUE CROSS/BLUE SHIELD | Admitting: Neurology

## 2022-05-22 ENCOUNTER — Telehealth: Payer: Self-pay | Admitting: *Deleted

## 2022-05-22 ENCOUNTER — Encounter: Payer: Self-pay | Admitting: Neurology

## 2022-05-22 VITALS — BP 124/73 | HR 67 | Ht 60.0 in | Wt 177.8 lb

## 2022-05-22 DIAGNOSIS — M542 Cervicalgia: Secondary | ICD-10-CM | POA: Diagnosis not present

## 2022-05-22 DIAGNOSIS — R569 Unspecified convulsions: Secondary | ICD-10-CM | POA: Diagnosis not present

## 2022-05-22 DIAGNOSIS — I776 Arteritis, unspecified: Secondary | ICD-10-CM

## 2022-05-22 DIAGNOSIS — G43711 Chronic migraine without aura, intractable, with status migrainosus: Secondary | ICD-10-CM

## 2022-05-22 DIAGNOSIS — G441 Vascular headache, not elsewhere classified: Secondary | ICD-10-CM

## 2022-05-22 DIAGNOSIS — G4489 Other headache syndrome: Secondary | ICD-10-CM

## 2022-05-22 MED ORDER — ALPRAZOLAM 0.5 MG PO TABS: ORAL_TABLET | ORAL | 0 refills | Status: DC

## 2022-05-22 MED ORDER — FLUTICASONE PROPIONATE 50 MCG/ACT NA SUSP: 1.0000 | Freq: Every day | NASAL | 5 refills | Status: DC | PRN

## 2022-05-22 NOTE — Progress Notes (Signed)
[facility]  PATIENT: Angel Rodriguez DOB: 04/10/1975  REFERRING DOCTOR OR PCP:   SOURCE: patient, ED records, images on PACS, reports in EMR  _________________________________   HISTORICAL  CHIEF COMPLAINT:  Chief Complaint  Patient presents with   Room 11    Pt is here Alone. Pt states that things have been about the same.Pt states that she has been getting headaches and pain in her right eye.     HISTORY OF PRESENT ILLNESS:  Angel Rodriguez is a 47 y.o. woman with frequent headaches and h/o seizure.  Update 05/22/2022: She has had more headaches again since the last visit.  These are right sided and associated with a lot of occipital pain.   .  ONB/TPI usually helps   When HA's are right-sided and right eye pain is worse and more severe, a sphenopalatine ganglion blocks have been very helpful  She is on Rituxan for CNS vasculitis.  She has done well and has less eye pain/pressure.    We did another SPG block at the last visit as she had more eye pain.  She denies any visual changes.   She has numbness in hr hands but no weakness.  The numbness is worse on the right.   It is in the palms more than dorsum.  Shaking her arms help.       Her first seizure was in 2016 and she had several when med's were withdrawn so was restarted.  Her last one was April 2021.  She is on Vimpat.  She tolerates this well and has not had any seizures on the current dose  She is on Trintellix and clonazepam for anxiety and depression.   Mood was doing better last year than this year.  She has no OSA on PSG 10/20/2020   The study found nocturnal hypoxemia and frequent bigeminy/trigeminy.    Nocturia O2 was started and she feels better.   She saw pulmonary.    PFTs were normal.      She saw cardiology.   Echo showed mild diastoic dysfunction.    Placed on a statin.     PSG 10/20/2021: No significant OSA was noted 44% of the night was with SaO2 80-89%     ---- nocturnal O2  recommended. 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  She had a second opinion from [place].  Changes in her seizure medications were made but no opinion about the possibility of vasculitis.  She was placed on prednisone 60 mg and titrating down in July 2021.  She had improvement of headaches and other symptoms.  MRI 10/15/2019 showed:   "Resolution of a region of T2 and FLAIR signal within the posterior frontal cortical and subcortical brain on the study of May. One could  question slight increased prominence of a T2 and FLAIR focus without enhancement in the subcortical brain at the bottom of a left frontal sulcus, axial FLAIR image 19. No other new or potentially progressive finding. Minimal small foci of cortical and subcortical enhancement in the left frontal region. I think the differential diagnosis for this case remains that of a vasculitis syndrome, venous thrombotic syndrome, autoimmune syndrome or demyelinating syndrome. I do not think we are dealing with tumor or arterial infarctions. "  MRI brain 07/06/2018 personally reviewed and compared to studies from January 2020.  It shows 2 foci in the anterior left frontal lobe.  One was mildly increased in size compared to the January MRI and one was decreased in size.  Another focus seen on the previous MRI in the left parasagittal  frontal lobe has resolved.  MRI of the cervical and thoracic spine 03/25/2018 showed normal spinal cord and mild cervical degenerative changes.  Labs: CSF 03/25/2018 showed 10 white blood cells (lymphocytes), 3 red cells, normal glucose, protein oligoclonal bands, VDRL.  In 03/2018, negative ANA, cANCA and pANCA, weakly positive ANCA proteinase 3, normal ACE level. ESR 10.  HIV was negative 09/22/2017.  Hepatitis C was negative 04/2016     REVIEW OF SYSTEMS: Constitutional: No fevers, chills, sweats, or change in appetite.  She has insomnia. Eyes: as above Ear, nose and throat: No hearing loss, ear pain, nasal congestion, sore throat Cardiovascular: No chest pain, palpitations Respiratory:  No shortness of breath at rest or with exertion.   No wheezes GastrointestinaI: No nausea, vomiting, diarrhea, abdominal pain, fecal incontinence Genitourinary:  No dysuria, urinary retention or frequency.  No nocturia.   Possible ovarian cancer Musculoskeletal:  No neck pain, back pain Integumentary: No rash, pruritus, skin lesions Neurological: as above Psychiatric: No depression at this time.  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 Allergic/Immunologic: No itchy/runny eyes, nasal congestion, recent allergic reactions, rashes  ALLERGIES: Allergies  Allergen Reactions   Ivp Dye [Iodinated Contrast Media] Hives, Itching and Swelling   Bee Venom Swelling    HOME MEDICATIONS:  Current Outpatient Medications:    Accu-Chek Softclix Lancets lancets, Use as instructed, Disp: 100 each, Rfl: 12   acetaminophen-codeine (TYLENOL #3) 300-30 MG tablet, One po up to qid prn, Disp: 28 tablet, Rfl: 2   Ascorbic Acid (VITAMIN C) 1000 MG tablet, Take 1,000 mg by mouth daily., Disp: , Rfl:    atorvastatin  (LIPITOR) 80 MG tablet, Take 1 tablet (80 mg total) by mouth daily., Disp: 90 tablet, Rfl: 1   atropine 1 % ophthalmic solution, Place 1 drop into the right eye 3 (three) times daily., Disp: 2 mL, Rfl: 0   b complex vitamins capsule, Take 1 capsule by mouth daily., Disp: , Rfl:    Blood Glucose Monitoring Suppl (ACCU-CHEK GUIDE ME) w/Device KIT, USE TO CHECK FASTING BLOOD SUGAR AND 2 HOURS AFTER LARGEST MEAL, Disp: 1 kit, Rfl: 0   brimonidine (ALPHAGAN P) 0.1 % SOLN, Place 1 drop into the right eye 3 (three) times daily. , Disp: , Rfl:    calcium carbonate (TUMS EX) 750 MG chewable tablet, Chew 2 tablets by mouth daily as needed for heartburn. , Disp: , Rfl:    clonazePAM (KLONOPIN) 1 MG tablet, Take 1 tablet (1 mg total) by mouth 2 (two) times daily as needed  for anxiety., Disp: 180 tablet, Rfl: 1   dorzolamide-timolol (COSOPT) 22.3-6.8 MG/ML ophthalmic solution, Place 1 drop into the right eye 2 (two) times daily. , Disp: , Rfl:    fluticasone (FLONASE) 50 MCG/ACT nasal spray, Place 1-2 sprays into both nostrils daily as needed for allergies. , Disp: , Rfl:    glucose blood (ACCU-CHEK GUIDE) test strip, Use to check fasting glucose and 2 hours after largest meal, Disp: 100 each, Rfl: 12   Lacosamide 100 MG TABS, Take 1 tablet (100 mg total) by mouth 2 (two) times daily., Disp: 180 tablet, Rfl: 1   latanoprost (XALATAN) 0.005 % ophthalmic solution, Place 1 drop into both eyes at bedtime., Disp: , Rfl:    metFORMIN (GLUCOPHAGE) 500 MG tablet, Take 1 tablet (500 mg total) by mouth 2 (two) times daily with a meal., Disp: 60 tablet, Rfl: 0   nystatin cream (MYCOSTATIN), Apply 1 application topically 2 (two) times daily. Apply to affected area BID for up to 7 days., Disp: 30 g, Rfl: 1   ondansetron (ZOFRAN-ODT) 4 MG disintegrating tablet, Take 1 tablet (4 mg total) by mouth every 8 (eight) hours as needed for nausea or vomiting., Disp: 20 tablet, Rfl: 11   SUMAtriptan (IMITREX) 100 MG tablet, Take 1  tablet (100 mg total) by mouth once as needed for migraine. May repeat in 2 hours if headache persists or recurs., Disp: 9 tablet, Rfl: 5   TURMERIC PO, Take by mouth., Disp: , Rfl:    verapamil (CALAN-SR) 240 MG CR tablet, Take 0.5 tablets (120 mg total) by mouth at bedtime., Disp: 45 tablet, Rfl: 3   Vitamin D, Ergocalciferol, (DRISDOL) 1.25 MG (50000 UNIT) CAPS capsule, Take 1 capsule (50,000 Units total) by mouth every 7 (seven) days., Disp: 12 capsule, Rfl: 0   vortioxetine HBr (TRINTELLIX) 10 MG TABS tablet, Take 1 tablet (10 mg total) by mouth daily., Disp: 90 tablet, Rfl: 0  PAST MEDICAL HISTORY: Past Medical History:  Diagnosis Date   Anxiety    Blind right eye 2009   swelling & pressure   Cluster headaches    started after seizure and migraines   Complication of anesthesia    trouble with short term memory after surgeries   COVID-19    Depression    Fibroids    s/p TLH, bilateral salpingectomy   GERD (gastroesophageal reflux disease)    patient thinks due to medications   History of anemia    prior to hysterectomy   History of pneumonia    History of trichomoniasis    Seizures ([place]) x 1 2-3 yrs ago none since   once saw neurologist, full body brain MRI and another 6 months lster   Status post right oophorectomy    Thrombocytopenia ([place])    sees [place] with Heme Onc   Vasculitis ([place])    neuro    PAST SURGICAL HISTORY: Past Surgical History:  Procedure Laterality Date   CYSTOSCOPY N/A 07/31/2017   Procedure: CYSTOSCOPY;  Surgeon: Megan Salon, MD;  Location: [facility];  Service: Gynecology;  Laterality: N/A;   ENDOMETRIAL ABLATION     EYE SURGERY     laser - removed blood from right eye   HYSTEROSCOPY WITH D & C     LAPAROSCOPY N/A 09/19/2017   Procedure: LAPAROSCOPY DIAGNOSTIC WITH RIGHT OOPHERECTOMY, LYSIS OF ADHESIONS;  Surgeon: Salvadore Dom, MD;  Location: [facility];  Service: Gynecology;  Laterality: N/A;   OMENTECTOMY N/A 11/21/2017   Procedure:  OMENTECTOMY AND  PELVIC WASHINGS;  Surgeon: Isabel Caprice, MD;  Location: [facility];  Service: Gynecology;  Laterality: N/A;   ROBOTIC ASSISTED SALPINGO OOPHERECTOMY Left 11/21/2017   Procedure: XI ROBOTIC ASSISTED LEFT SALPINGO OOPHORECTOMY;  Surgeon: Isabel Caprice, MD;  Location: [facility];  Service: Gynecology;  Laterality: Left;   TOTAL LAPAROSCOPIC HYSTERECTOMY WITH SALPINGECTOMY Bilateral 07/31/2017   Procedure: TOTAL LAPAROSCOPIC HYSTERECTOMY WITH SALPINGECTOMY;  Surgeon: Megan Salon, MD;  Location: [facility];  Service: Gynecology;  Laterality: Bilateral;  20 week size uterus/ Alexis bag in room/ need 4.5 hours   TUBAL LIGATION     WISDOM TOOTH EXTRACTION      FAMILY HISTORY: Family History  Problem Relation Age of Onset   Sleep apnea Mother    Alcoholism Father    Cirrhosis Father    Alcohol abuse Father    Cancer Maternal Grandmother        unsure of type; died of other causes   Heart disease Maternal Aunt    Drug abuse Cousin     SOCIAL HISTORY:  Social History   Socioeconomic History   Marital status: Single    Spouse name: Not on file   Number of children: 3   Years of education: Not on file   Highest education level: Associate degree: occupational, Hotel manager, or vocational program  Occupational History   Not on file  Tobacco Use   Smoking status: Former    Years: 20.00    Types: Cigarettes, E-cigarettes    Quit date: 05/05/2018    Years since quitting: 3.8   Smokeless tobacco: Never  Vaping Use   Vaping Use: Former  Substance and Sexual Activity   Alcohol use: Not Currently   Drug use: No   Sexual activity: Yes    Birth control/protection: Surgical    Comment: hysterectomy  Other Topics Concern   Not on file  Social History Narrative   Not on file   Social Determinants of Health   Financial Resource Strain: Not on file  Food Insecurity: Not on file  Transportation Needs: Not on file  Physical Activity: Not on file  Stress: Not on file  Social  Connections: Not on file  Intimate Partner Violence: Not on file     PHYSICAL EXAM  Vitals:   03/22/22 1149  BP: 115/70  Pulse: 70  Weight: 175 lb (79.4 kg)  Height: 5' (1.524 m)    Body mass index is 34.18 kg/m.   General: The patient is well-developed and well-nourished and in no acute distress.  Mallampati 2  Eyes:   She has mild conjunctival erythema on the right..    Neck: T she has mild tenderness over the right occiput and splenius capitis.  Range of motion is fairly normal in the neck.  Neurologic Exam  Mental status: The patient is alert and oriented x 3 at the time of the examination. The patient has apparent normal recent and remote memory, with an apparently normal attention span and concentration ability.   Speech is normal.  Cranial nerves: Extraocular movements are full.  She has very mild right ptosis and facial strength is normal elsewhere.  There is normal facial sensation to soft touch.   Trapezius and sternocleidomastoid strength is normal. No dysarthria is noted.  Hearing is symmetric.  Motor:  Muscle bulk is normal.   Tone is normal. Strength is  5 / 5 in all 4 extremities.   Sensory: She has normal sensation to touch and vibration in the arms and legs  Gait and station: Station is normal.   Gait is normal and the tandem gait is mildly wide.  Romberg negative  DTRs:  Normal and symmetric    DIAGNOSTIC DATA (LABS, IMAGING, TESTING) - I reviewed patient records, labs, notes, testing and imaging myself where available.  Lab Results  Component Value Date   WBC 5.7 09/19/2021   HGB 13.1 09/19/2021   HCT 38.7 09/19/2021   MCV 87 09/19/2021   PLT 82 (LL) 09/19/2021      Component Value Date/Time   NA 142 09/19/2021 1209   NA 138 08/31/2016 0957   K 3.9 09/19/2021 1209   K 3.9 08/31/2016 0957   CL 105 09/19/2021 1209   CO2 23 09/19/2021 1209   CO2 22 08/31/2016 0957   GLUCOSE 98 09/19/2021 1209   GLUCOSE 148 (H) 07/10/2018 0915   GLUCOSE  106 08/31/2016 0957   BUN 9 09/19/2021 1209   BUN 8.6 08/31/2016 0957   CREATININE 0.61 09/19/2021 1209   CREATININE 0.8 08/31/2016 0957   CALCIUM 9.9 09/19/2021 1209   CALCIUM 10.3 08/31/2016 0957   PROT 7.3 09/19/2021 1209   PROT 7.6 08/31/2016 0957   ALBUMIN 4.8 09/19/2021 1209   ALBUMIN 3.9 08/31/2016 0957   AST 17 09/19/2021 1209   AST 18 08/31/2016 0957   ALT 27 09/19/2021 1209   ALT 23 08/31/2016 0957   ALKPHOS 81 09/19/2021 1209   ALKPHOS 62 08/31/2016 0957   BILITOT 0.3 09/19/2021 1209   BILITOT 0.43 08/31/2016 0957   GFRNONAA 111 02/04/2019 0902   GFRAA 128 02/04/2019 0902          ASSESSMENT AND PLAN  CNS vasculitis (HCC) - Plan: Antiphospholipid Syndrome Comp, IgG, IgA, IgM, CBC with Differential/Platelet  Seizures (HCC)  Intractable chronic migraine without aura and with status migrainosus  Nocturnal hypoxemia  High risk medication use - Plan: IgG, IgA, IgM, CBC with Differential/Platelet   1.   Continue Rituxan for CNS vaculitis.  I will check an MRI of the brain to determine if there has been progression and consider a different disease modifying therapy if this is happening.  She will need Xanax due to claustrophobia 2.   Continue Vimpat for seizures.   Continue Trintellix/clonazepam for mood 3.    She has nocturnal hypoxemia.  Continue 2L O2 at night.   4.   Right splenius capitus muscle injection with 80 mg Depo-Medrol in [place] using sterile technique.    She tolerated the injection well  Pain was much better afterwards.   5.   return in 6 months.  She will call if she has new or worsening symptoms.    Claron Rosencrans A. Felecia Shelling, MD, PhD [id number], [id number] PM Certified in Neurology, Clinical Neurophysiology, Sleep Medicine, Pain Medicine and Neuroimaging   [facility] [address] [place], [place] [address] [phone]

## 2022-05-22 NOTE — Telephone Encounter (Signed)
Faxed completed/signed form below back to [facility] at [phone]. Received fax confirmation. Also provided copy to [facility].

## 2022-05-25 ENCOUNTER — Telehealth: Payer: Self-pay | Admitting: Neurology

## 2022-05-25 DIAGNOSIS — E782 Mixed hyperlipidemia: Secondary | ICD-10-CM | POA: Insufficient documentation

## 2022-05-25 NOTE — Telephone Encounter (Signed)
Angel Rodriguez: [id number] exp. 05/24/22-06/22/22 sent to [facility], they are the only close facility that is in network with her insurance plan.[phone]

## 2022-05-29 ENCOUNTER — Encounter: Payer: Self-pay | Admitting: Neurology

## 2022-05-30 NOTE — Telephone Encounter (Signed)
Received fax from [facility] asking for refill on lacosamide 100mg  as well. I called and spoke w/ pharmacist, Merrilee Seashore. He was able to locate rx sent 04/04/22 #180, 1 refill and will get this ready for pt.

## 2022-05-31 ENCOUNTER — Other Ambulatory Visit: Payer: Self-pay | Admitting: Neurology

## 2022-05-31 ENCOUNTER — Other Ambulatory Visit: Payer: Self-pay | Admitting: Nurse Practitioner

## 2022-05-31 DIAGNOSIS — E119 Type 2 diabetes mellitus without complications: Secondary | ICD-10-CM

## 2022-06-01 ENCOUNTER — Other Ambulatory Visit: Payer: Self-pay | Admitting: *Deleted

## 2022-06-02 MED ORDER — ATORVASTATIN CALCIUM 80 MG PO TABS: 80.0000 mg | ORAL_TABLET | Freq: Every day | ORAL | 0 refills | Status: DC

## 2022-06-02 NOTE — Telephone Encounter (Signed)
Scheduled 04/12

## 2022-06-05 ENCOUNTER — Telehealth: Payer: Self-pay | Admitting: Neurology

## 2022-06-05 ENCOUNTER — Other Ambulatory Visit: Payer: Self-pay | Admitting: Neurology

## 2022-06-05 MED ORDER — PILOCARPINE HCL 5 MG PO TABS: 5.0000 mg | ORAL_TABLET | Freq: Two times a day (BID) | ORAL | 5 refills | Status: DC

## 2022-06-05 NOTE — Telephone Encounter (Signed)
Patient informed that Dr.Sater send in Rx for pilocarpine tablets

## 2022-06-05 NOTE — Telephone Encounter (Signed)
Pt is requesting something for dry mouth due to the medication she is taking. She stated the over the counter mouth wash is not helping

## 2022-06-05 NOTE — Telephone Encounter (Signed)
Dr.Sater I called patient to discuss the below. Patient said she has been using OTC dry mouth for 1 month now and no relief. Pt is not sure which medication is causing dry mouth, she states in the past you have prescribed a medication to help with this and it worked she would use it for about a week and just use OTC dry mouth prn.   She would like this Rx again and it to be sent to [facility] pharmacy.  Please advise

## 2022-06-06 NOTE — Telephone Encounter (Signed)
PT last seen at [facility] 03/22/22 by Dr. Felecia Shelling. She is requesting refills of xanax, atropine eye drops, and prednisone.   Xanax Prior Dispenses: It looks like when Dr. Felecia Shelling previously prescribed the xanax he was only giving her a days worth for MRI only.    Dispensed Days Supply Quantity Provider Pharmacy  ALPRAZOLAM 0.5 MG TABS 05/22/2022 1 3 tablet Sater, Nanine Means, MD [facility] [phone] Cherylann Banas.Marland KitchenMarland Kitchen

## 2022-06-09 ENCOUNTER — Ambulatory Visit
Admission: EM | Admit: 2022-06-09 | Discharge: 2022-06-09 | Disposition: A | Discharge status: Home / Self Care | Payer: BLUE CROSS/BLUE SHIELD | Attending: Internal Medicine | Admitting: Internal Medicine

## 2022-06-09 DIAGNOSIS — Z20822 Contact with and (suspected) exposure to covid-19: Secondary | ICD-10-CM

## 2022-06-09 DIAGNOSIS — J069 Acute upper respiratory infection, unspecified: Secondary | ICD-10-CM

## 2022-06-09 DIAGNOSIS — J029 Acute pharyngitis, unspecified: Secondary | ICD-10-CM

## 2022-06-09 LAB — POCT INFLUENZA A/B
Influenza A, POC: NEGATIVE
Influenza B, POC: NEGATIVE

## 2022-06-09 LAB — POCT RAPID STREP A (OFFICE): Rapid Strep A Screen: NEGATIVE

## 2022-06-09 MED ORDER — CHLORASEPTIC 1.4 % MT LIQD: 1.0000 | OROMUCOSAL | 0 refills | Status: DC | PRN

## 2022-06-09 MED ORDER — BENZONATATE 100 MG PO CAPS: 100.0000 mg | ORAL_CAPSULE | Freq: Three times a day (TID) | ORAL | 0 refills | Status: DC | PRN

## 2022-06-09 NOTE — ED Triage Notes (Addendum)
Pt c/o Sore Throat, Weakness, Nasal congestion, Bodyache, nausea vomiting no fever  x 1 week   Pt took home COVID test it was negative.   Pt has not tried any otc meds, afraid to take otc meds due to medical hx.

## 2022-06-09 NOTE — Discharge Instructions (Signed)
Rapid strep and rapid flu are negative.  Throat culture and COVID test are pending.  Will call if they are abnormal.  I have prescribed a cough medication and throat spray to help alleviate symptoms.  Follow-up if any symptoms persist or worsen.

## 2022-06-09 NOTE — ED Provider Notes (Signed)
[facility]    CSN: [id number] Arrival date & time: 06/09/22  1437      History   Chief Complaint Chief Complaint  Patient presents with   Sore Throat    HPI Angel Rodriguez is a 47 y.o. female.   Patient presents with sore throat, nasal congestion, cough, body aches that have been present for about 5 days. Patient took a covid test at home that was negative.  Patient denies any known sick contacts or fevers.  She has not any medications given that she takes medications that decrease her immune system so she is not sure what she is supposed to take.  Denies chest pain, shortness of breath, gastrointestinal symptoms.   Sore Throat    Past Medical History:  Diagnosis Date   Anxiety    Blind right eye 2009   swelling & pressure   Cluster headaches    started after seizure and migraines   Complication of anesthesia    trouble with short term memory after surgeries   COVID-19    Depression    Fibroids    s/p TLH, bilateral salpingectomy   GERD (gastroesophageal reflux disease)    patient thinks due to medications   History of anemia    prior to hysterectomy   History of pneumonia    History of trichomoniasis    Seizures x 1 2-3 yrs ago none since   once saw neurologist, full body brain MRI and another 6 months lster   Status post right oophorectomy    Thrombocytopenia    sees [place] with Heme Onc   Vasculitis    neuro    Patient Active Problem List   Diagnosis Date Noted   Mixed hyperlipidemia 05/25/2022   Major depressive disorder, single episode, moderate 02/11/2021   Major depressive disorder, recurrent episode, severe 11/23/2020   Nocturnal hypoxemia 10/27/2020   Type 2 diabetes mellitus without complication, without long-term current use of insulin 08/16/2020   OSA (obstructive sleep apnea) 08/16/2020   Ingrowing toenail of left foot 01/20/2020   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   Pain in both hands 12/08/2019   COVID-19 virus infection 03/06/2019    GAD (generalized anxiety disorder) 09/03/2018   Major depressive disorder, single episode, in partial remission 06/06/2018   Panic disorder 06/06/2018   Flank pain 05/27/2018   Acute left-sided low back pain without sciatica 05/27/2018   Mood disorder 05/09/2018   Mental confusion/ slurred speech 05/09/2018   High risk medication use 05/09/2018   Bilateral hand numbness 03/18/2018   Vitamin D deficiency 03/18/2018   Thrombocytopenia 03/14/2018   Dysarthria 03/14/2018   Brain lesion 03/14/2018   Right sided weakness 03/13/2018   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   Abnormal MRI of head 01/21/2016   Vascular headache 09/15/2015   Insomnia 09/15/2015   Headache 07/08/2015   Intractable chronic migraine without aura and with status migrainosus 07/08/2015   Neck pain 06/24/2015   Seizure 06/24/2015   Central retinal vein occlusion 12/11/2011   Cotton wool exudates 12/11/2011   Cystoid macular edema 12/11/2011    Past Surgical History:  Procedure Laterality Date   CYSTOSCOPY N/A 07/31/2017   Procedure: CYSTOSCOPY;  Surgeon: Jerene Bears, MD;  Location: [facility];  Service: Gynecology;  Laterality: N/A;   ENDOMETRIAL ABLATION     EYE SURGERY     laser - removed blood from right eye   HYSTEROSCOPY WITH D & C     LAPAROSCOPY N/A 09/19/2017   Procedure: LAPAROSCOPY  DIAGNOSTIC WITH RIGHT OOPHERECTOMY, LYSIS OF ADHESIONS;  Surgeon: Romualdo BolkJertson, Jill Evelyn, MD;  Location: [facility];  Service: Gynecology;  Laterality: N/A;   OMENTECTOMY N/A 11/21/2017   Procedure: OMENTECTOMY AND PELVIC WASHINGS;  Surgeon: Shonna ChockPhelps, Shawna B, MD;  Location: [facility];  Service: Gynecology;  Laterality: N/A;   ROBOTIC ASSISTED SALPINGO OOPHERECTOMY Left 11/21/2017   Procedure: XI ROBOTIC ASSISTED LEFT SALPINGO OOPHORECTOMY;  Surgeon: Shonna ChockPhelps, Shawna B, MD;  Location: [facility];  Service: Gynecology;  Laterality: Left;   TOTAL LAPAROSCOPIC HYSTERECTOMY WITH SALPINGECTOMY Bilateral 07/31/2017   Procedure:  TOTAL LAPAROSCOPIC HYSTERECTOMY WITH SALPINGECTOMY;  Surgeon: Jerene BearsMiller, Mary S, MD;  Location: [facility];  Service: Gynecology;  Laterality: Bilateral;  20 week size uterus/ Alexis bag in room/ need 4.5 hours   TUBAL LIGATION     WISDOM TOOTH EXTRACTION      OB History     Gravida  5   Para  3   Term  3   Preterm  0   AB  2   Living  3      SAB  1   IAB      Ectopic  1   Multiple      Live Births               Home Medications    Prior to Admission medications   Medication Sig Start Date End Date Taking? Authorizing Provider  benzonatate (TESSALON) 100 MG capsule Take 1 capsule (100 mg total) by mouth every 8 (eight) hours as needed for cough. 06/09/22  Yes , Rolly SalterHaley E, FNP  phenol (CHLORASEPTIC) 1.4 % LIQD Use as directed 1 spray in the mouth or throat as needed for throat irritation / pain. 06/09/22  Yes , Acie FredricksonHaley E, FNP  pilocarpine (SALAGEN) 5 MG tablet Take 1 tablet (5 mg total) by mouth 2 (two) times daily. 06/05/22   Sater, Pearletha Furlichard A, MD  Accu-Chek Softclix Lancets lancets Use as instructed 07/21/20   Mayer MaskerAbonza, Maritza, PA-C  acetaminophen-codeine (TYLENOL #3) 300-30 MG tablet TAKE ONE TABLET BY MOUTH UP TO FOUR TIMES A DAY AS NEEDED 04/04/22   Sater, Pearletha Furlichard A, MD  ALPRAZolam Prudy Feeler(XANAX) 0.5 MG tablet Take 2 to 3 before MRI.   Must have a driver 2/95/623/18/24   Sater, Pearletha Furlichard A, MD  Ascorbic Acid (VITAMIN C) 1000 MG tablet Take 1,000 mg by mouth daily.    [provider]  atorvastatin (LIPITOR) 80 MG tablet Take 1 tablet (80 mg total) by mouth daily. No additional refill w/o office visit 06/02/22   Debbe OdeaAgbor-Etang, Brian, MD  atropine 1 % ophthalmic solution INSTILL ONE DROP INTO THE RIGHT EYE THREE TIMES DAILY 06/06/22   Sater, Pearletha Furlichard A, MD  b complex vitamins capsule Take 1 capsule by mouth daily.    [provider]  Blood Glucose Monitoring Suppl (ACCU-CHEK GUIDE ME) w/Device KIT USE TO CHECK FASTING BLOOD SUGAR AND 2 HOURS AFTER LARGEST MEAL 11/09/21   Abonza,  Maritza, PA-C  brimonidine (ALPHAGAN P) 0.1 % SOLN Place 1 drop into the right eye 3 (three) times daily.  06/21/15   [provider]  busPIRone (BUSPAR) 15 MG tablet Take 1 tablet twice a day by oral route.    [provider]  calcium carbonate (TUMS EX) 750 MG chewable tablet Chew 2 tablets by mouth daily as needed for heartburn.     [provider]  clonazePAM (KLONOPIN) 1 MG tablet TAKE ONE TABLET BY MOUTH TWICE A DAY AS NEEDED FOR ANXIETY 04/04/22  Sater, Pearletha Furl, MD  dorzolamide-timolol (COSOPT) 22.3-6.8 MG/ML ophthalmic solution Place 1 drop into the right eye 2 (two) times daily.  06/21/15   [provider]  fluticasone (FLONASE) 50 MCG/ACT nasal spray Place 1-2 sprays into both nostrils daily as needed for allergies. 05/22/22   Sater, Pearletha Furl, MD  glucose blood (ACCU-CHEK GUIDE) test strip Use to check fasting glucose and 2 hours after largest meal 07/28/20   Abonza, [place], PA-C  Lacosamide 100 MG TABS TAKE ONE TABLET BY MOUTH TWICE A DAY 04/04/22   Sater, Richard A, MD  latanoprost (XALATAN) 0.005 % ophthalmic solution Place 1 drop into both eyes at bedtime.    [provider]  metFORMIN (GLUCOPHAGE) 500 MG tablet TAKE ONE TABLET BY MOUTH TWICE A DAY WITH A MEAL 06/01/22   Carlean Jews, NP  nystatin cream (MYCOSTATIN) Apply 1 application topically 2 (two) times daily. Apply to affected area BID for up to 7 days. 09/08/20   Jerene Bears, MD  ondansetron (ZOFRAN-ODT) 4 MG disintegrating tablet Take 1 tablet (4 mg total) by mouth every 8 (eight) hours as needed for nausea or vomiting. 09/19/21   Sater, Pearletha Furl, MD  predniSONE (DELTASONE) 2.5 MG tablet TAKE ONE TABLET BY MOUTH ONE TIME DAILY FOR TWO WEEKS THEN TAKE ONE-HALF TABLET BY MOUTH ONE TIME DAILY FOR TWO WEEKS THEN STOP 06/06/22   Sater, Pearletha Furl, MD  SUMAtriptan (IMITREX) 100 MG tablet Take 1 tablet (100 mg total) by mouth once as needed for migraine. May repeat in 2 hours if headache  persists or recurs. 09/19/21   Sater, Pearletha Furl, MD  TURMERIC PO Take by mouth.    [provider]  verapamil (CALAN-SR) 240 MG CR tablet TAKE ONE TABLET BY MOUTH AT BEDTIME 04/04/22   Sater, Pearletha Furl, MD  Vitamin D, Ergocalciferol, (DRISDOL) 1.25 MG (50000 UNIT) CAPS capsule Take 1 capsule (50,000 Units total) by mouth every 7 (seven) days. 04/20/21   Mayer Masker, PA-C  vortioxetine HBr (TRINTELLIX) 10 MG TABS tablet Take 1 tablet (10 mg total) by mouth daily. 12/03/20   Oneta Rack, NP    Family History Family History  Problem Relation Age of Onset   Sleep apnea Mother    Alcoholism Father    Cirrhosis Father    Alcohol abuse Father    Cancer Maternal Grandmother        unsure of type; died of other causes   Heart disease Maternal Aunt    Drug abuse Cousin     Social History Social History   Tobacco Use   Smoking status: Former    Years: 20    Types: Cigarettes, E-cigarettes    Quit date: 05/05/2018    Years since quitting: 4.0   Smokeless tobacco: Never  Vaping Use   Vaping Use: Former  Substance Use Topics   Alcohol use: Not Currently   Drug use: No     Allergies   Ivp dye [iodinated contrast media] and Bee venom   Review of Systems Review of Systems Per HPI  Physical Exam Triage Vital Signs ED Triage Vitals  Enc Vitals Group     BP 06/09/22 1441 (!) 143/89     Pulse Rate 06/09/22 1441 78     Resp 06/09/22 1441 17     Temp 06/09/22 1441 97.7 F (36.5 C)     Temp Source 06/09/22 1441 Oral     SpO2 06/09/22 1441 95 %     Weight --  Height --      Head Circumference --      Peak Flow --      Pain Score 06/09/22 1448 10     Pain Loc --      Pain Edu? --      Excl. in GC? --    No data found.  Updated Vital Signs BP (!) 143/89 (BP Location: Right Arm)   Pulse 78   Temp 97.7 F (36.5 C) (Oral)   Resp 17   LMP 05/02/2017   SpO2 95%   Visual Acuity Right Eye Distance:   Left Eye Distance:   Bilateral Distance:    Right Eye  Near:   Left Eye Near:    Bilateral Near:     Physical Exam Constitutional:      General: She is not in acute distress.    Appearance: Normal appearance. She is not toxic-appearing or diaphoretic.  HENT:     Head: Normocephalic and atraumatic.     Right Ear: Tympanic membrane and ear canal normal.     Left Ear: Tympanic membrane and ear canal normal.     Nose: Congestion present.     Mouth/Throat:     Mouth: Mucous membranes are moist.     Pharynx: Posterior oropharyngeal erythema present.  Eyes:     Extraocular Movements: Extraocular movements intact.     Conjunctiva/sclera: Conjunctivae normal.     Pupils: Pupils are equal, round, and reactive to light.  Cardiovascular:     Rate and Rhythm: Normal rate and regular rhythm.     Pulses: Normal pulses.     Heart sounds: Normal heart sounds.  Pulmonary:     Effort: Pulmonary effort is normal. No respiratory distress.     Breath sounds: Normal breath sounds. No stridor. No wheezing, rhonchi or rales.  Abdominal:     General: Abdomen is flat. Bowel sounds are normal.     Palpations: Abdomen is soft.  Musculoskeletal:        General: Normal range of motion.     Cervical back: Normal range of motion.  Skin:    General: Skin is warm and dry.  Neurological:     General: No focal deficit present.     Mental Status: She is alert and oriented to person, place, and time. Mental status is at baseline.  Psychiatric:        Mood and Affect: Mood normal.        Behavior: Behavior normal.      UC Treatments / Results  Labs (all labs ordered are listed, but only abnormal results are displayed) Labs Reviewed  SARS CORONAVIRUS 2 (TAT 6-24 HRS)  CULTURE, GROUP A STREP [facility])  POCT RAPID STREP A (OFFICE)  POCT INFLUENZA A/B    EKG   Radiology No results found.  Procedures Procedures (including critical care time)  Medications Ordered in UC Medications - No data to display  Initial Impression / Assessment and Plan / UC  Course  I have reviewed the triage vital signs and the nursing notes.  Pertinent labs & imaging results that were available during my care of the patient were reviewed by me and considered in my medical decision making (see chart for details).     Patient presents with symptoms likely from a viral upper respiratory infection.  Do not suspect underlying cardiopulmonary process. Symptoms seem unlikely related to ACS, CHF or COPD exacerbations, pneumonia, pneumothorax. Patient is nontoxic appearing and not in need of emergent medical intervention.  Patient requested covid and flu testing. Rapid strep and rapid flu are negative.  Throat culture and COVID test pending but do not have flu PCR capabilities here in urgent care.  Recommended symptom control with medications and supportive care.  Patient was sent prescriptions.  Return if symptoms fail to improve in 1-2 weeks or you develop shortness of breath, chest pain, severe headache. Patient states understanding and is agreeable.  Discharged with PCP followup.  Final Clinical Impressions(s) / UC Diagnoses   Final diagnoses:  Encounter for laboratory testing for COVID-19 virus  Viral upper respiratory tract infection with cough  Sore throat     Discharge Instructions      Rapid strep and rapid flu are negative.  Throat culture and COVID test are pending.  Will call if they are abnormal.  I have prescribed a cough medication and throat spray to help alleviate symptoms.  Follow-up if any symptoms persist or worsen.     ED Prescriptions     Medication Sig Dispense Auth. Provider   benzonatate (TESSALON) 100 MG capsule Take 1 capsule (100 mg total) by mouth every 8 (eight) hours as needed for cough. 21 capsule [place], [place] E, [place]   phenol (CHLORASEPTIC) 1.4 % LIQD Use as directed 1 spray in the mouth or throat as needed for throat irritation / pain. 118 mL Gustavus Bryant, [place]      PDMP not reviewed this encounter.   Gustavus Bryant,  [place] 06/09/22 1524

## 2022-06-10 LAB — SARS CORONAVIRUS 2 (TAT 6-24 HRS): SARS Coronavirus 2: NEGATIVE

## 2022-06-11 LAB — CULTURE, GROUP A STREP (THRC)

## 2022-06-12 LAB — CULTURE, GROUP A STREP (THRC)

## 2022-06-16 ENCOUNTER — Ambulatory Visit: Payer: BLUE CROSS/BLUE SHIELD | Attending: Cardiology | Admitting: Cardiology

## 2022-07-10 ENCOUNTER — Telehealth: Payer: Self-pay | Admitting: Cardiology

## 2022-07-10 NOTE — Telephone Encounter (Signed)
*  STAT* If patient is at the pharmacy, call can be transferred to refill team.   1. Which medications need to be refilled? (please list name of each medication and dose if known)  atorvastatin (LIPITOR) 80 MG tablet   2. Which pharmacy/location (including street and city if local pharmacy) is medication to be sent to? [facility] [address] [place], [place] - [address] AT [facility] [address]   3. Do they need a 30 day or 90 day supply? 30 day  Patient has appt on 07/13/22.  Patient is out of medication.

## 2022-07-13 ENCOUNTER — Encounter: Payer: Self-pay | Admitting: Cardiology

## 2022-07-13 ENCOUNTER — Ambulatory Visit: Payer: BLUE CROSS/BLUE SHIELD | Attending: Cardiology | Admitting: Cardiology

## 2022-07-13 VITALS — BP 106/80 | HR 65 | Ht 60.0 in | Wt 174.2 lb

## 2022-07-13 DIAGNOSIS — Z6835 Body mass index (BMI) 35.0-35.9, adult: Secondary | ICD-10-CM | POA: Diagnosis not present

## 2022-07-13 DIAGNOSIS — I5189 Other ill-defined heart diseases: Secondary | ICD-10-CM

## 2022-07-13 DIAGNOSIS — E782 Mixed hyperlipidemia: Secondary | ICD-10-CM | POA: Diagnosis not present

## 2022-07-13 MED ORDER — ATORVASTATIN CALCIUM 80 MG PO TABS: 80.0000 mg | ORAL_TABLET | Freq: Every day | ORAL | 3 refills | Status: DC

## 2022-07-13 NOTE — Progress Notes (Signed)
Cardiology Office Note:    Date:  07/13/2022   ID:  Angel Rodriguez, DOB 1975/09/10, MRN [id number]  PCP:  Carlean Jews, NP   [facility] Providers Cardiologist:  Debbe Odea, MD     Referring MD: Carlean Jews, NP   Chief Complaint  Patient presents with   Follow-up    Patient denies new or acute cardiac problems/concerns today.  Needing a refill on Atorvastatin.      History of Present Illness:    Angel Rodriguez is a 47 y.o. 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    Being seen for hyperlipidemia, started on Lipitor with good effect.  States running out of Lipitor the past week.  States feeling stressed, going to some issues with her family.  Previously intolerant to therapies, plans to follow-up with therapist.  Denies chest pain.plans to exercise more, trying to lose weight.  Prior notes  Echo 01/2021 EF 60 to 65%, grade 2 diastolic dysfunction Lexiscan Myoview 12/2020, low risk, no evidence for ischemia Mother had CAD in her 60s, underwent three-vessel CABG.   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 She takes verapamil due to migraines.   Past Medical History:  Diagnosis Date   Anxiety    Blind right eye 2009   swelling & pressure   Cluster headaches    started after seizure and migraines   Complication of anesthesia    trouble with short term memory after surgeries   COVID-19    Depression    Fibroids    s/p TLH, bilateral salpingectomy   GERD (gastroesophageal reflux disease)    patient thinks due to medications   History of anemia    prior to hysterectomy   History of pneumonia    History of trichomoniasis    Seizures (HCC) x 1 2-3 yrs ago none since   once saw neurologist, full body brain MRI and another 6 months lster   Status post right oophorectomy    Thrombocytopenia (HCC)    sees  [place] with Heme Onc   Vasculitis (HCC)    neuro    Past Surgical History:  Procedure Laterality Date   CYSTOSCOPY N/A 07/31/2017   Procedure: CYSTOSCOPY;  Surgeon: Jerene Bears, MD;  Location: [facility];  Service: Gynecology;  Laterality: N/A;   ENDOMETRIAL ABLATION     EYE SURGERY     laser - removed blood from right eye   HYSTEROSCOPY WITH D & C     LAPAROSCOPY N/A 09/19/2017   Procedure: LAPAROSCOPY DIAGNOSTIC WITH RIGHT OOPHERECTOMY, LYSIS OF ADHESIONS;  Surgeon: Romualdo Bolk, MD;  Location: [facility];  Service: Gynecology;  Laterality: N/A;   OMENTECTOMY N/A 11/21/2017   Procedure: OMENTECTOMY AND PELVIC WASHINGS;  Surgeon: Shonna Chock, MD;  Location: [facility];  Service: Gynecology;  Laterality: N/A;   ROBOTIC ASSISTED SALPINGO OOPHERECTOMY Left 11/21/2017   Procedure: XI ROBOTIC ASSISTED LEFT SALPINGO OOPHORECTOMY;  Surgeon: Shonna Chock, MD;  Location: [facility];  Service: Gynecology;  Laterality: Left;   TOTAL LAPAROSCOPIC HYSTERECTOMY WITH SALPINGECTOMY Bilateral 07/31/2017   Procedure: TOTAL LAPAROSCOPIC HYSTERECTOMY WITH SALPINGECTOMY;  Surgeon: Jerene Bears, MD;  Location: [facility];  Service: Gynecology;  Laterality: Bilateral;  20 week size uterus/ Alexis bag in room/ need 4.5 hours   TUBAL LIGATION     WISDOM TOOTH EXTRACTION  Current Medications: Current Meds  Medication Sig   Accu-Chek Softclix Lancets lancets Use as instructed   acetaminophen-codeine (TYLENOL #3) 300-30 MG tablet TAKE ONE TABLET BY MOUTH UP TO FOUR TIMES A DAY AS NEEDED   Ascorbic Acid (VITAMIN C) 1000 MG tablet Take 1,000 mg by mouth daily.   atropine 1 % ophthalmic solution INSTILL ONE DROP INTO THE RIGHT EYE THREE TIMES DAILY   b complex vitamins capsule Take 1 capsule by mouth daily.   Blood Glucose Monitoring Suppl (ACCU-CHEK GUIDE ME) w/Device KIT USE TO CHECK FASTING BLOOD SUGAR AND 2 HOURS AFTER LARGEST MEAL   brimonidine (ALPHAGAN P) 0.1 % SOLN Place 1 drop into the right eye 3  (three) times daily.    calcium carbonate (TUMS EX) 750 MG chewable tablet Chew 2 tablets by mouth daily as needed for heartburn.    clonazePAM (KLONOPIN) 1 MG tablet TAKE ONE TABLET BY MOUTH TWICE A DAY AS NEEDED FOR ANXIETY   dorzolamide-timolol (COSOPT) 22.3-6.8 MG/ML ophthalmic solution Place 1 drop into the right eye 2 (two) times daily.    fluticasone (FLONASE) 50 MCG/ACT nasal spray Place 1-2 sprays into both nostrils daily as needed for allergies.   glucose blood (ACCU-CHEK GUIDE) test strip Use to check fasting glucose and 2 hours after largest meal   Lacosamide 100 MG TABS TAKE ONE TABLET BY MOUTH TWICE A DAY   latanoprost (XALATAN) 0.005 % ophthalmic solution Place 1 drop into both eyes at bedtime.   metFORMIN (GLUCOPHAGE) 500 MG tablet TAKE ONE TABLET BY MOUTH TWICE A DAY WITH A MEAL   nystatin cream (MYCOSTATIN) Apply 1 application topically 2 (two) times daily. Apply to affected area BID for up to 7 days.   ondansetron (ZOFRAN-ODT) 4 MG disintegrating tablet Take 1 tablet (4 mg total) by mouth every 8 (eight) hours as needed for nausea or vomiting.   pilocarpine (SALAGEN) 5 MG tablet Take 1 tablet (5 mg total) by mouth 2 (two) times daily.   SUMAtriptan (IMITREX) 100 MG tablet Take 1 tablet (100 mg total) by mouth once as needed for migraine. May repeat in 2 hours if headache persists or recurs.   TURMERIC PO Take by mouth.   verapamil (CALAN-SR) 240 MG CR tablet TAKE ONE TABLET BY MOUTH AT BEDTIME   Vitamin D, Ergocalciferol, (DRISDOL) 1.25 MG (50000 UNIT) CAPS capsule Take 1 capsule (50,000 Units total) by mouth every 7 (seven) days.   vortioxetine HBr (TRINTELLIX) 10 MG TABS tablet Take 1 tablet (10 mg total) by mouth daily.   [DISCONTINUED] atorvastatin (LIPITOR) 80 MG tablet Take 1 tablet (80 mg total) by mouth daily. No additional refill w/o office visit     Allergies:   Ivp dye [iodinated contrast media] and Bee venom   Social History   Socioeconomic History   Marital  status: Single    Spouse name: Not on file   Number of children: 3   Years of education: Not on file   Highest education level: Associate degree: occupational, Scientist, product/process development, or vocational program  Occupational History   Not on file  Tobacco Use   Smoking status: Former    Years: 20    Types: Cigarettes, E-cigarettes    Quit date: 05/05/2018    Years since quitting: 4.1   Smokeless tobacco: Never  Vaping Use   Vaping Use: Former  Substance and Sexual Activity   Alcohol use: Not Currently   Drug use: No   Sexual activity: Yes    Birth control/protection: Surgical  Comment: hysterectomy  Other Topics Concern   Not on file  Social History Narrative   Not on file   Social Determinants of Health   Financial Resource Strain: Not on file  Food Insecurity: Not on file  Transportation Needs: Not on file  Physical Activity: Not on file  Stress: Not on file  Social Connections: Not on file     Family History: The patient's family history includes Alcohol abuse in her father; Alcoholism in her father; Cancer in her maternal grandmother; Cirrhosis in her father; Drug abuse in her cousin; Heart disease in her maternal aunt; Sleep apnea in her mother.  ROS:   Please see the history of present illness.     All other systems reviewed and are negative.  EKGs/Labs/Other Studies Reviewed:    The following studies were reviewed today:   EKG:  EKG is ordered today.  EKG Shows Normal Sinus Rhythm, heart rate 65  Recent Labs: 09/19/2021: ALT 27; BUN 9; Creatinine, Ser 0.61; Potassium 3.9; Sodium 142 03/22/2022: Hemoglobin 13.8; Platelets 105  Recent Lipid Panel    Component Value Date/Time   CHOL 204 (H) 10/13/2021 0943   CHOL 171 02/17/2021 0845   TRIG 168 (H) 10/13/2021 0943   HDL 63 10/13/2021 0943   HDL 47 02/17/2021 0845   CHOLHDL 3.2 10/13/2021 0943   VLDL 34 10/13/2021 0943   LDLCALC 107 (H) 10/13/2021 0943   LDLCALC 85 02/17/2021 0845     Risk Assessment/Calculations:           Physical Exam:    VS:  BP 106/80 (BP Location: Left Arm, Patient Position: Sitting, Cuff Size: Normal)   Pulse 65   Ht 5' (1.524 m)   Wt 174 lb 3.2 oz (79 kg)   LMP 05/02/2017   SpO2 97%   BMI 34.02 kg/m     Wt Readings from Last 3 Encounters:  07/13/22 174 lb 3.2 oz (79 kg)  05/22/22 177 lb 12.8 oz (80.6 kg)  04/24/22 180 lb 6.4 oz (81.8 kg)     GEN:  Well nourished, well developed in no acute distress HEENT: Normal NECK: No JVD; No carotid bruits CARDIAC: RRR, no murmurs, rubs, gallops RESPIRATORY:  Clear to auscultation without rales, wheezing or rhonchi  ABDOMEN: Soft, non-tender, non-distended MUSCULOSKELETAL:  No edema; No deformity  SKIN: Warm and dry NEUROLOGIC:  Alert and oriented x 3 PSYCHIATRIC:  Normal affect   ASSESSMENT:    1. Mixed hyperlipidemia   2. Diastolic dysfunction   3. BMI 35.0-35.9,adult    PLAN:    In order of problems listed above:  Mixed hyperlipidemia, cholesterol improved with taking Lipitor.  Medication compliance advised.  Refill Lipitor 80 mg daily, check lipid panel in 3 months. Grade 2 diastolic dysfunction, euvolemic.  Diastolic dysfunction likely from obesity.  Weight loss advised. Obesity, low-calorie diet, weight loss advised.  Follow-up in 6 months     Medication Adjustments/Labs and Tests Ordered: Current medicines are reviewed at length with the patient today.  Concerns regarding medicines are outlined above.  Orders Placed This Encounter  Procedures   Lipid panel   EKG 12-Lead     Meds ordered this encounter  Medications   atorvastatin (LIPITOR) 80 MG tablet    Sig: Take 1 tablet (80 mg total) by mouth daily.    Dispense:  90 tablet    Refill:  3      Patient Instructions  Medication Instructions:   Your physician recommends that you continue on your current  medications as directed. Please refer to the Current Medication list given to you today.  *If you need a refill on your cardiac  medications before your next appointment, please call your pharmacy*   Lab Work:  Your physician recommends that you return for lab work in: [facility]. You will need to be fasting.  No appt is needed. Hours are M-F 7AM- 6 PM.  If you have labs (blood work) drawn today and your tests are completely normal, you will receive your results only by: MyChart Message (if you have MyChart) OR A paper copy in the mail If you have any lab test that is abnormal or we need to change your treatment, we will call you to review the results.   Testing/Procedures:  None Ordered    Follow-Up: At [facility], you and your health needs are our priority.  As part of our continuing mission to provide you with exceptional heart care, we have created designated Provider Care Teams.  These Care Teams include your primary Cardiologist (physician) and Advanced Practice Providers (APPs -  Physician Assistants and Nurse Practitioners) who all work together to provide you with the care you need, when you need it.  We recommend signing up for the patient portal called "MyChart".  Sign up information is provided on this After Visit Summary.  MyChart is used to connect with patients for Virtual Visits (Telemedicine).  Patients are able to view lab/test results, encounter notes, upcoming appointments, etc.  Non-urgent messages can be sent to your provider as well.   To learn more about what you can do with MyChart, go to [email or web address].    Your next appointment:   6 month(s)  Provider:   You may see Debbe Odea, MD or one of the following Advanced Practice Providers on your designated Care Team:   Nicolasa Ducking, NP Eula Listen, PA-C Cadence Fransico Michael, PA-C Charlsie Quest, NP   Signed, Debbe Odea, MD  07/13/2022 9:54 AM    [facility]

## 2022-07-13 NOTE — Patient Instructions (Signed)
Medication Instructions:   Your physician recommends that you continue on your current medications as directed. Please refer to the Current Medication list given to you today.  *If you need a refill on your cardiac medications before your next appointment, please call your pharmacy*   Lab Work:  Your physician recommends that you return for lab work in: [facility]. You will need to be fasting.  No appt is needed. Hours are M-F 7AM- 6 PM.  If you have labs (blood work) drawn today and your tests are completely normal, you will receive your results only by: MyChart Message (if you have MyChart) OR A paper copy in the mail If you have any lab test that is abnormal or we need to change your treatment, we will call you to review the results.   Testing/Procedures:  None Ordered   Follow-Up: At [facility], you and your health needs are our priority.  As part of our continuing mission to provide you with exceptional heart care, we have created designated Provider Care Teams.  These Care Teams include your primary Cardiologist (physician) and Advanced Practice Providers (APPs -  Physician Assistants and Nurse Practitioners) who all work together to provide you with the care you need, when you need it.  We recommend signing up for the patient portal called "MyChart".  Sign up information is provided on this After Visit Summary.  MyChart is used to connect with patients for Virtual Visits (Telemedicine).  Patients are able to view lab/test results, encounter notes, upcoming appointments, etc.  Non-urgent messages can be sent to your provider as well.   To learn more about what you can do with MyChart, go to [email or web address].    Your next appointment:   6 month(s)  Provider:   You may see Brian Agbor-Etang, MD or one of the following Advanced Practice Providers on your designated Care Team:   Christopher Berge, NP Ryan Dunn, PA-C Cadence Furth, PA-C Sheri  Hammock, NP 

## 2022-08-01 ENCOUNTER — Other Ambulatory Visit: Payer: Self-pay | Admitting: Nurse Practitioner

## 2022-08-01 DIAGNOSIS — E119 Type 2 diabetes mellitus without complications: Secondary | ICD-10-CM

## 2022-08-12 ENCOUNTER — Other Ambulatory Visit: Payer: Self-pay | Admitting: Neurology

## 2022-08-14 MED ORDER — LACOSAMIDE 100 MG PO TABS: 1.0000 | ORAL_TABLET | Freq: Two times a day (BID) | ORAL | 0 refills | Status: DC

## 2022-08-14 NOTE — Telephone Encounter (Addendum)
Last seen on 05/22/22 Follow up scheduled on 09/27/22 Last filled on 05/30/22 #180 tablets (90 day supply Rx pending to be signed   (Sorry I signed in error)

## 2022-08-17 ENCOUNTER — Telehealth: Payer: Self-pay | Admitting: Neurology

## 2022-08-17 NOTE — Telephone Encounter (Signed)
I personally reviewed the MRI of the brain from 06/22/2022.  It shows 2 T2/FLAIR hyperintense foci in the left frontal lobe, both in the subcortical region.  These are consistent with her remote strokes.  There were no new findings.  No enhancing lesions.  She also appears to have a small left frontal arachnoid cyst that would be asymptomatic.

## 2022-08-23 ENCOUNTER — Ambulatory Visit: Payer: Self-pay | Admitting: Nurse Practitioner

## 2022-08-25 ENCOUNTER — Ambulatory Visit: Payer: Self-pay | Admitting: Family Medicine

## 2022-08-25 NOTE — Progress Notes (Deleted)
   Established Patient Office Visit  Subjective   Patient ID: Angel Rodriguez, female    DOB: Dec 09, 1975  Age: 47 y.o. MRN: [id number]  No chief complaint on file.   HPI Does she have insurance?   Dm2 - foot exam  Pulmonology referral? - 2l [place] at night  Neurology - CNS vasculitis  Cardiology - CHF?   Ophthalmology - eye drops?   The 10-year ASCVD risk score (Arnett DK, et al., 2019) is: 3.6%   {History (Optional):23778}  ROS    Objective:     LMP 05/02/2017  {Vitals History (Optional):23777}  Physical Exam   No results found for any visits on 08/25/22.  {Labs (Optional):23779}      Assessment & Plan:   There are no diagnoses linked to this encounter.   No follow-ups on file.    Sandre Kitty, MD

## 2022-09-27 ENCOUNTER — Encounter: Payer: Self-pay | Admitting: Neurology

## 2022-09-27 ENCOUNTER — Ambulatory Visit (INDEPENDENT_AMBULATORY_CARE_PROVIDER_SITE_OTHER): Payer: BLUE CROSS/BLUE SHIELD | Admitting: Neurology

## 2022-09-27 VITALS — BP 106/69 | HR 77 | Ht 60.0 in | Wt 180.5 lb

## 2022-09-27 DIAGNOSIS — I776 Arteritis, unspecified: Secondary | ICD-10-CM

## 2022-09-27 DIAGNOSIS — M542 Cervicalgia: Secondary | ICD-10-CM | POA: Diagnosis not present

## 2022-09-27 DIAGNOSIS — G4734 Idiopathic sleep related nonobstructive alveolar hypoventilation: Secondary | ICD-10-CM

## 2022-09-27 DIAGNOSIS — Z79899 Other long term (current) drug therapy: Secondary | ICD-10-CM

## 2022-09-27 DIAGNOSIS — G441 Vascular headache, not elsewhere classified: Secondary | ICD-10-CM

## 2022-09-27 DIAGNOSIS — R569 Unspecified convulsions: Secondary | ICD-10-CM | POA: Diagnosis not present

## 2022-09-27 DIAGNOSIS — F419 Anxiety disorder, unspecified: Secondary | ICD-10-CM

## 2022-09-27 DIAGNOSIS — R930 Abnormal findings on diagnostic imaging of skull and head, not elsewhere classified: Secondary | ICD-10-CM

## 2022-09-27 MED ORDER — LACOSAMIDE 100 MG PO TABS: 1.0000 | ORAL_TABLET | Freq: Two times a day (BID) | ORAL | 1 refills | Status: DC

## 2022-09-27 MED ORDER — CLONAZEPAM 1 MG PO TABS
1.0000 mg | ORAL_TABLET | Freq: Two times a day (BID) | ORAL | 1 refills | Status: DC
Start: 2022-09-27 — End: 2023-05-01

## 2022-09-27 NOTE — Patient Instructions (Addendum)
She is having trouble getting Trintellix through insurance.   We discussed that nefazodone has the most similar MOA and is generic but may not be tolerated as well.

## 2022-09-27 NOTE — Progress Notes (Addendum)
[facility]  PATIENT: Angel Rodriguez DOB: September 12, 1975  REFERRING DOCTOR OR PCP:   SOURCE: patient, ED records, images on PACS, reports in EMR  _________________________________   HISTORICAL  CHIEF COMPLAINT:  Chief Complaint  Patient presents with   Follow-up    Rm 10. Accompanied by daughter. Patient has had worsening severe headaches during the night. She feels pressure in her eyes. Has an MRI disc in room to review.    HISTORY OF PRESENT ILLNESS:  Angel Rodriguez is a 47 y.o. woman with frequent headaches and h/o seizure.  Update 09/27/2022: She is on Rituxan for CNS vasculitis. Last infusion was April 2024   She has done well and has less eye pain/pressure.   THe current generic is better tolerated than the first.      Over the last month she has had more headaches.  These are severe and are worse during the night.  She feels pressure in the eyes, right more than left, and pain on the right side of her face.  Sometimes she has visua chanes.  .    Right eye is often red.   When HA's are right-sided and more severe, sphenopalatine ganglion blocks have been very helpful     trigger point injections have helped which she has neck pain as well.  Sumatriptan helps sometimes  She has not tried an anti-CGRP agent.     She denies any visual changes.   She has numbness in hr hands but no weakness.  The numbness is worse on the right.   It is in the palms more than dorsum.  Shaking her arms help.       She has no recent seizure.   First one was 2016 and she had several when med's were withdrawn so was restarted.  Her last one was April 2021.  She is on Vimpat.    She was on Trintellix and clonazepam for anxiety and depression. She is having problems with insurance approving it.     Mood was doing better last year than this year.  She has no OSA on PSG 10/20/2020   The study found nocturnal hypoxemia and frequent bigeminy/trigeminy.    Nocturia O2 was started and she feels  better.   She saw pulmonary.    PFTs were normal.      She saw cardiology.   Echo showed mild diastoic dysfunction.    Placed on a statin.    I reviewed the MRI she did in April 2020 for the shoulder.  I compared it to the previous MRI.  There are no new lesions  PSG 10/20/2021: No significant OSA was noted 44% of the night was with SaO2 80-89%     ---- nocturnal O2 recommended. 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  She had a second opinion from [place].  Changes in her seizure medications were made but no opinion about the possibility of vasculitis.  She was placed on prednisone 60 mg and titrating down in July 2021.  She had improvement of headaches and other symptoms.  MRI 10/15/2019 showed:   "Resolution of a region of T2 and FLAIR signal within the posterior frontal cortical and subcortical brain on the study of May. One could question slight increased prominence of a T2 and FLAIR focus without enhancement in the subcortical brain at the bottom of a left frontal sulcus, axial FLAIR image 19. No other new or potentially progressive finding. Minimal small foci of cortical and subcortical enhancement in the left frontal region. I think the differential diagnosis for this case remains that of a vasculitis syndrome, venous thrombotic syndrome, autoimmune syndrome or demyelinating  syndrome. I do not think we are dealing with tumor or arterial infarctions. "  MRI brain 07/06/2018 personally reviewed and compared to studies from January 2020.  It shows 2 foci in the anterior left frontal lobe.  One was mildly increased in size compared to the January MRI and one was decreased in size.  Another focus seen on the previous MRI in the left parasagittal frontal lobe has resolved.  MRI of the cervical and thoracic spine 03/25/2018 showed normal spinal cord and mild cervical degenerative changes.  MRI of the brain from 06/22/2022. It shows 2 T2/FLAIR hyperintense foci in the left frontal lobe, both in the subcortical region. These are consistent with her remote strokes. There were no new findings. No enhancing lesions. She also appears to have a small left frontal arachnoid cyst that would be asymptomatic.   Labs: CSF 03/25/2018 showed 10 white blood cells (lymphocytes), 3 red cells, normal glucose, protein oligoclonal bands, VDRL.  In 03/2018, negative ANA, cANCA and pANCA, weakly positive ANCA proteinase 3, normal ACE level. ESR 10.  HIV was negative 09/22/2017.  Hepatitis C was negative 04/2016     REVIEW OF SYSTEMS: Constitutional: No fevers, chills, sweats, or change in appetite.  She has insomnia. Eyes: as above Ear, nose and throat: No hearing loss, ear pain, nasal congestion, sore throat Cardiovascular: No chest pain, palpitations Respiratory:  No shortness of breath at rest or with exertion.   No wheezes GastrointestinaI: No nausea, vomiting, diarrhea, abdominal pain, fecal incontinence Genitourinary:  No dysuria, urinary retention or frequency.  No nocturia.   Possible ovarian cancer Musculoskeletal:  No neck pain, back pain Integumentary: No rash, pruritus, skin lesions Neurological: as above Psychiatric: No depression at this time.  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 Allergic/Immunologic: No itchy/runny eyes, nasal congestion, recent allergic reactions, rashes  ALLERGIES: Allergies  Allergen Reactions   Ivp Dye [Iodinated Contrast Media] Hives, Itching and Swelling   Bee Venom Swelling   Buspar [Buspirone] Other (See Comments)    HOME MEDICATIONS:  Current Outpatient Medications:    Accu-Chek Softclix Lancets lancets, Use as instructed, Disp: 100 each, Rfl: 12   acetaminophen-codeine (TYLENOL #3) 300-30 MG tablet, TAKE ONE TABLET BY MOUTH UP TO FOUR TIMES A DAY AS NEEDED, Disp: 28 tablet, Rfl: 2   Ascorbic Acid (VITAMIN C) 1000 MG tablet, Take 1,000 mg by mouth daily., Disp: , Rfl:    atorvastatin (LIPITOR) 80 MG tablet, Take 1 tablet (  80 mg total) by mouth daily., Disp: 90 tablet, Rfl: 3   atropine 1 % ophthalmic solution, INSTILL ONE DROP INTO THE RIGHT EYE THREE TIMES DAILY, Disp: 5 mL, Rfl: 0   b complex vitamins capsule, Take 1 capsule by mouth daily., Disp: , Rfl:    Blood Glucose Monitoring Suppl (ACCU-CHEK GUIDE ME) w/Device KIT, USE TO CHECK FASTING BLOOD SUGAR AND 2 HOURS AFTER LARGEST MEAL, Disp: 1 kit, Rfl: 0   calcium carbonate (TUMS EX) 750 MG chewable tablet, Chew 2 tablets by mouth daily as needed for heartburn. , Disp: , Rfl:    clonazePAM (KLONOPIN) 1 MG tablet, TAKE ONE TABLET BY MOUTH TWICE A DAY AS NEEDED FOR ANXIETY, Disp: 180 tablet, Rfl: 1   dorzolamide-timolol (COSOPT) 22.3-6.8 MG/ML ophthalmic solution, Place 1 drop into the right eye 2 (two) times daily. , Disp: , Rfl:    fluticasone (FLONASE) 50 MCG/ACT nasal spray, Place 1-2 sprays into both nostrils daily as needed for allergies., Disp: 15.8 mL, Rfl: 5   glucose blood (ACCU-CHEK GUIDE) test strip, Use to check fasting glucose and 2 hours after largest meal, Disp: 100 each, Rfl: 12   Lacosamide 100 MG TABS, Take 1 tablet (100 mg total) by mouth 2 (two) times  daily., Disp: 180 tablet, Rfl: 0   metFORMIN (GLUCOPHAGE) 500 MG tablet, TAKE ONE TABLET BY MOUTH TWICE A DAY WITH A MEAL, Disp: 60 tablet, Rfl: 0   nystatin cream (MYCOSTATIN), Apply 1 application topically 2 (two) times daily. Apply to affected area BID for up to 7 days., Disp: 30 g, Rfl: 1   ondansetron (ZOFRAN-ODT) 4 MG disintegrating tablet, Take 1 tablet (4 mg total) by mouth every 8 (eight) hours as needed for nausea or vomiting., Disp: 20 tablet, Rfl: 11   pilocarpine (SALAGEN) 5 MG tablet, Take 1 tablet (5 mg total) by mouth 2 (two) times daily., Disp: 60 tablet, Rfl: 5   SUMAtriptan (IMITREX) 100 MG tablet, Take 1 tablet (100 mg total) by mouth once as needed for migraine. May repeat in 2 hours if headache persists or recurs., Disp: 9 tablet, Rfl: 5   TURMERIC PO, Take by mouth., Disp: , Rfl:    verapamil (CALAN-SR) 240 MG CR tablet, TAKE ONE TABLET BY MOUTH AT BEDTIME, Disp: 90 tablet, Rfl: 2   brimonidine (ALPHAGAN P) 0.1 % SOLN, Place 1 drop into the right eye 3 (three) times daily.  (Patient not taking: Reported on 09/27/2022), Disp: , Rfl:    latanoprost (XALATAN) 0.005 % ophthalmic solution, Place 1 drop into both eyes at bedtime., Disp: , Rfl:    vortioxetine HBr (TRINTELLIX) 10 MG TABS tablet, Take 1 tablet (10 mg total) by mouth daily. (Patient not taking: Reported on 09/27/2022), Disp: 90 tablet, Rfl: 0  PAST MEDICAL HISTORY: Past Medical History:  Diagnosis Date   Anxiety    Blind right eye 2009   swelling & pressure   Cluster headaches    started after seizure and migraines   Complication of anesthesia    trouble with short term memory after surgeries   COVID-19    Depression    Fibroids    s/p TLH, bilateral salpingectomy   GERD (gastroesophageal reflux disease)    patient thinks due to medications   History of anemia    prior to hysterectomy   History of pneumonia    History of trichomoniasis    Seizures (HCC) x 1 2-3 yrs ago none since   once saw neurologist,  full body brain  MRI and another 6 months lster   Status post right oophorectomy    Thrombocytopenia (HCC)    sees [place] with Heme Onc   Vasculitis (HCC)    neuro    PAST SURGICAL HISTORY: Past Surgical History:  Procedure Laterality Date   CYSTOSCOPY N/A 07/31/2017   Procedure: CYSTOSCOPY;  Surgeon: Jerene Bears, MD;  Location: [facility];  Service: Gynecology;  Laterality: N/A;   ENDOMETRIAL ABLATION     EYE SURGERY     laser - removed blood from right eye   HYSTEROSCOPY WITH D & C     LAPAROSCOPY N/A 09/19/2017   Procedure: LAPAROSCOPY DIAGNOSTIC WITH RIGHT OOPHERECTOMY, LYSIS OF ADHESIONS;  Surgeon: Romualdo Bolk, MD;  Location: [facility];  Service: Gynecology;  Laterality: N/A;   OMENTECTOMY N/A 11/21/2017   Procedure: OMENTECTOMY AND PELVIC WASHINGS;  Surgeon: Shonna Chock, MD;  Location: [facility];  Service: Gynecology;  Laterality: N/A;   ROBOTIC ASSISTED SALPINGO OOPHERECTOMY Left 11/21/2017   Procedure: XI ROBOTIC ASSISTED LEFT SALPINGO OOPHORECTOMY;  Surgeon: Shonna Chock, MD;  Location: [facility];  Service: Gynecology;  Laterality: Left;   TOTAL LAPAROSCOPIC HYSTERECTOMY WITH SALPINGECTOMY Bilateral 07/31/2017   Procedure: TOTAL LAPAROSCOPIC HYSTERECTOMY WITH SALPINGECTOMY;  Surgeon: Jerene Bears, MD;  Location: [facility];  Service: Gynecology;  Laterality: Bilateral;  20 week size uterus/ Alexis bag in room/ need 4.5 hours   TUBAL LIGATION     WISDOM TOOTH EXTRACTION      FAMILY HISTORY: Family History  Problem Relation Age of Onset   Sleep apnea Mother    Alcoholism Father    Cirrhosis Father    Alcohol abuse Father    Cancer Maternal Grandmother        unsure of type; died of other causes   Heart disease Maternal Aunt    Drug abuse Cousin     SOCIAL HISTORY:  Social History   Socioeconomic History   Marital status: Single    Spouse name: Not on file   Number of children: 3   Years of education: Not on file   Highest education level: Associate degree:  occupational, Scientist, product/process development, or vocational program  Occupational History   Not on file  Tobacco Use   Smoking status: Former    Current packs/day: 0.00    Types: Cigarettes, E-cigarettes    Start date: 05/05/1998    Quit date: 05/05/2018    Years since quitting: 4.4   Smokeless tobacco: Never  Vaping Use   Vaping status: Former  Substance and Sexual Activity   Alcohol use: Not Currently   Drug use: No   Sexual activity: Yes    Birth control/protection: Surgical    Comment: hysterectomy  Other Topics Concern   Not on file  Social History Narrative   Not on file   Social Determinants of Health   Financial Resource Strain: Not on file  Food Insecurity: Not on file  Transportation Needs: Not on file  Physical Activity: Not on file  Stress: Not on file  Social Connections: Not on file  Intimate Partner Violence: Not on file     PHYSICAL EXAM  Vitals:   09/27/22 1006  BP: 106/69  Pulse: 77  Weight: 180 lb 8 oz (81.9 kg)  Height: 5' (1.524 m)    Body mass index is 35.25 kg/m.   General: The patient is well-developed and well-nourished and in no acute distress.  Eyes:   She has mild conjunctival erythema on the right.[name]  Neck: T there is tenderness over the right occipital nerve and splenius capitis muscle range of motion is fairly normal in the neck.  Neurologic Exam  Mental status: The patient is alert and oriented x 3 at the time of the examination. The patient has apparent normal recent and remote memory, with an apparently normal attention span and concentration ability.   Speech is normal.  Cranial nerves: Extraocular movements are full. Mild right ptosis and facial strength is normal elsewhere.  There is normal facial sensation to soft touch.   Trapezius and sternocleidomastoid strength is normal. No dysarthria is noted.  Hearing is symmetric.  Motor:  Muscle bulk is normal.   Tone is normal. Strength is  5 / 5 in all 4 extremities.   Sensory: She has normal  sensation to touch and vibration in the arms and legs   Gait and station: Station is normal.   Gait is normal and the tandem gait is mildly wide.  Romberg negative  DTRs:  Normal and symmetric    DIAGNOSTIC DATA (LABS, IMAGING, TESTING) - I reviewed patient records, labs, notes, testing and imaging myself where available.  Lab Results  Component Value Date   WBC 5.6 03/22/2022   HGB 13.8 03/22/2022   HCT 41.5 03/22/2022   MCV 86 03/22/2022   PLT 105 (L) 03/22/2022      Component Value Date/Time   NA 142 09/19/2021 1209   NA 138 08/31/2016 0957   K 3.9 09/19/2021 1209   K 3.9 08/31/2016 0957   CL 105 09/19/2021 1209   CO2 23 09/19/2021 1209   CO2 22 08/31/2016 0957   GLUCOSE 98 09/19/2021 1209   GLUCOSE 148 (H) 07/10/2018 0915   GLUCOSE 106 08/31/2016 0957   BUN 9 09/19/2021 1209   BUN 8.6 08/31/2016 0957   CREATININE 0.61 09/19/2021 1209   CREATININE 0.8 08/31/2016 0957   CALCIUM 9.9 09/19/2021 1209   CALCIUM 10.3 08/31/2016 0957   PROT 7.3 09/19/2021 1209   PROT 7.6 08/31/2016 0957   ALBUMIN 4.8 09/19/2021 1209   ALBUMIN 3.9 08/31/2016 0957   AST 17 09/19/2021 1209   AST 18 08/31/2016 0957   ALT 27 09/19/2021 1209   ALT 23 08/31/2016 0957   ALKPHOS 81 09/19/2021 1209   ALKPHOS 62 08/31/2016 0957   BILITOT 0.3 09/19/2021 1209   BILITOT 0.43 08/31/2016 0957   GFRNONAA 111 02/04/2019 0902   GFRAA 128 02/04/2019 0902         PROCEDURE Sphenopalatine ganglion block The patient was placed in the supine position.  A 20g catheter was lubricated with gel, and placed in the right naris. The catheter was inserted above the middle turbinate to the posterior nasal cavity. .  2 mL of 2% lidocaine was placed. The patient was asked to swallow during the injection. The patient demonstrated erythema of the sclera of the eye on this side, and tearing.   The patient tolerated the procedure well. No complications of the procedure were noted. The patient was kept in the supine  position for 2-3 minutes following the procedure. She was given small sips of water after sitting up following the procedure.      ASSESSMENT AND PLAN  CNS vasculitis (HCC)  Seizures (HCC)  Vascular headache  Nocturnal hypoxemia  High risk medication use  Abnormal MRI of head   1.   Continue Rituxan for CNS vaculitis   check labs 2.   Continue Vimpat for seizures.   Continue Trintellix/clonazepam for mood if  covered.   Consider change to nefazodone if Trintellix not covered (SSRI not helpful and nefazodone has most similar MOA) 3.    She has nocturnal hypoxemia.   Continue 2L O2 at night.  4.   Right sphenopalatine ganglion block with 2 cc of 2% lidocaine.  Pain was much better afterwards.   5.   Right splenius capitus trigger point injection with 80 mg Depo-Medrol in 3 cc Marcaine using sterile technique.  She tolerated the procedure well and pain was better afterwards. 6.   Trial of Nurtec for migraine.    Lot [id number]   Exp 07/2024. return in 6 months.  She will call if she has new or worsening symptoms.    Evalena Fujii A. Epimenio Foot, MD, PhD 09/27/2022, 10:22 AM Certified in Neurology, Clinical Neurophysiology, Sleep Medicine, Pain Medicine and Neuroimaging   [facility] [address] [place], [place] [address] [phone]

## 2022-09-28 LAB — CBC WITH DIFFERENTIAL/PLATELET

## 2022-09-28 LAB — IGG, IGA, IGM
IgA/Immunoglobulin A, Serum: 323 mg/dL (ref 87–352)
IgG (Immunoglobin G), Serum: 919 mg/dL (ref 586–1602)
IgM (Immunoglobulin M), Srm: 21 mg/dL — ABNORMAL LOW (ref 26–217)

## 2022-12-16 ENCOUNTER — Other Ambulatory Visit: Payer: Self-pay | Admitting: Nurse Practitioner

## 2022-12-29 ENCOUNTER — Other Ambulatory Visit: Payer: Self-pay | Admitting: Neurology

## 2023-01-01 NOTE — Telephone Encounter (Signed)
Last seen on 09/27/22 Follow up scheduled on 01/17/23

## 2023-01-10 ENCOUNTER — Encounter (HOSPITAL_BASED_OUTPATIENT_CLINIC_OR_DEPARTMENT_OTHER): Payer: Self-pay | Admitting: Obstetrics & Gynecology

## 2023-01-17 ENCOUNTER — Encounter: Payer: Self-pay | Admitting: Neurology

## 2023-01-17 ENCOUNTER — Ambulatory Visit: Payer: BLUE CROSS/BLUE SHIELD | Admitting: Neurology

## 2023-02-19 ENCOUNTER — Other Ambulatory Visit: Payer: Self-pay | Admitting: Nurse Practitioner

## 2023-02-20 ENCOUNTER — Ambulatory Visit (HOSPITAL_BASED_OUTPATIENT_CLINIC_OR_DEPARTMENT_OTHER): Payer: BLUE CROSS/BLUE SHIELD | Admitting: Obstetrics & Gynecology

## 2023-02-20 ENCOUNTER — Encounter (HOSPITAL_BASED_OUTPATIENT_CLINIC_OR_DEPARTMENT_OTHER): Payer: Self-pay | Admitting: Obstetrics & Gynecology

## 2023-02-20 VITALS — BP 140/65 | HR 53 | Ht 59.75 in | Wt 178.0 lb

## 2023-02-20 DIAGNOSIS — Z23 Encounter for immunization: Secondary | ICD-10-CM | POA: Diagnosis not present

## 2023-02-20 DIAGNOSIS — D3910 Neoplasm of uncertain behavior of unspecified ovary: Secondary | ICD-10-CM

## 2023-02-20 DIAGNOSIS — Z7984 Long term (current) use of oral hypoglycemic drugs: Secondary | ICD-10-CM

## 2023-02-20 DIAGNOSIS — F411 Generalized anxiety disorder: Secondary | ICD-10-CM

## 2023-02-20 DIAGNOSIS — E119 Type 2 diabetes mellitus without complications: Secondary | ICD-10-CM

## 2023-02-20 DIAGNOSIS — Z01419 Encounter for gynecological examination (general) (routine) without abnormal findings: Secondary | ICD-10-CM

## 2023-02-20 DIAGNOSIS — Z90710 Acquired absence of both cervix and uterus: Secondary | ICD-10-CM

## 2023-02-20 NOTE — Telephone Encounter (Signed)
This is a [place] pt 

## 2023-02-20 NOTE — Progress Notes (Signed)
47 y.o. [id number] Single Other or two or more races female here for annual exam.  Denies vaginal bleeding.  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 Has not been here since 11/2021.  Needs ca-125 today. 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    Has been driving back and forth to [place] for work.  Finally decided this was just too much for her.  Resigned but has gotten a job locally and pleased with this.    Patient's last menstrual period was 05/02/2017.          Sexually active: Yes.    The current method of family planning is status post hysterectomy.     Health Maintenance: Pap:  03/29/2017 Negative History of abnormal Pap:  no MMG:  11/04/2021 Negative Colonoscopy:  declined colonoscopy.  Has cologuard. Screening Labs: followed by cardiology   reports that she quit smoking about 4 years ago. Her smoking use included cigarettes and e-cigarettes. She started smoking about 24 years ago. She has never used smokeless tobacco. She reports that she does not currently use alcohol. She reports that she does not use drugs.  Past Medical History:  Diagnosis Date   Anxiety    Blind right eye 2009   swelling & pressure   Cluster headaches    started after seizure and migraines   Complication of anesthesia    trouble with short term memory after surgeries   COVID-19    Depression    Fibroids    s/p TLH, bilateral salpingectomy   GERD (gastroesophageal reflux disease)    patient thinks due to medications   History of anemia    prior to hysterectomy   History of pneumonia    History of trichomoniasis    Seizures (HCC) x 1 2-3 yrs ago none since   once saw neurologist, full body brain MRI and another 6 months lster   Status post right oophorectomy    Thrombocytopenia (HCC)    sees [place] with Heme Onc    Vasculitis (HCC)    neuro    Past Surgical History:  Procedure Laterality Date   CYSTOSCOPY N/A 07/31/2017   Procedure: CYSTOSCOPY;  Surgeon: Jerene Bears, MD;  Location: [facility];  Service: Gynecology;  Laterality: N/A;   ENDOMETRIAL ABLATION     EYE SURGERY     laser - removed blood from right eye   HYSTEROSCOPY WITH D & C     LAPAROSCOPY N/A 09/19/2017   Procedure: LAPAROSCOPY DIAGNOSTIC WITH RIGHT OOPHERECTOMY, LYSIS OF ADHESIONS;  Surgeon: Romualdo Bolk, MD;  Location: [facility];  Service: Gynecology;  Laterality: N/A;   OMENTECTOMY N/A 11/21/2017   Procedure: OMENTECTOMY AND PELVIC WASHINGS;  Surgeon: Shonna Chock, MD;  Location: [facility];  Service: Gynecology;  Laterality: N/A;   ROBOTIC ASSISTED SALPINGO OOPHERECTOMY Left 11/21/2017   Procedure: XI ROBOTIC ASSISTED LEFT SALPINGO OOPHORECTOMY;  Surgeon: Shonna Chock, MD;  Location: [facility];  Service: Gynecology;  Laterality: Left;   TOTAL LAPAROSCOPIC HYSTERECTOMY WITH SALPINGECTOMY Bilateral 07/31/2017   Procedure: TOTAL LAPAROSCOPIC HYSTERECTOMY WITH SALPINGECTOMY;  Surgeon: Jerene Bears, MD;  Location: [facility];  Service: Gynecology;  Laterality: Bilateral;  20 week size uterus/ Alexis bag in room/ need 4.5 hours   TUBAL LIGATION     WISDOM TOOTH EXTRACTION      Current Outpatient Medications  Medication Sig Dispense Refill  Accu-Chek Softclix Lancets lancets Use as instructed 100 each 12   acetaminophen-codeine (TYLENOL #3) 300-30 MG tablet TAKE ONE TABLET BY MOUTH UP TO FOUR TIMES A DAY AS NEEDED 28 tablet 2   Ascorbic Acid (VITAMIN C) 1000 MG tablet Take 1,000 mg by mouth daily.     atorvastatin (LIPITOR) 80 MG tablet Take 1 tablet (80 mg total) by mouth daily. 90 tablet 3   atropine 1 % ophthalmic solution INSTILL ONE DROP INTO THE RIGHT EYE THREE TIMES DAILY 5 mL 0   b complex vitamins capsule Take 1 capsule by mouth daily.     Blood Glucose Monitoring Suppl (ACCU-CHEK GUIDE ME) w/Device KIT USE TO CHECK  FASTING BLOOD SUGAR AND 2 HOURS AFTER LARGEST MEAL 1 kit 0   brimonidine (ALPHAGAN P) 0.1 % SOLN Place 1 drop into the right eye 3 (three) times daily.  (Patient not taking: Reported on 09/27/2022)     calcium carbonate (TUMS EX) 750 MG chewable tablet Chew 2 tablets by mouth daily as needed for heartburn.      clonazePAM (KLONOPIN) 1 MG tablet Take 1 tablet (1 mg total) by mouth 2 (two) times daily. 180 tablet 1   dorzolamide-timolol (COSOPT) 22.3-6.8 MG/ML ophthalmic solution Place 1 drop into the right eye 2 (two) times daily.      fluticasone (FLONASE) 50 MCG/ACT nasal spray ADMINISTER 1 TO 2 SPRAYS INTO BOTH NOSTRILS DAILY AS NEEDED FOR ALLERGIES 16 mL 1   glucose blood (ACCU-CHEK GUIDE) test strip Use to check fasting glucose and 2 hours after largest meal 100 each 12   Lacosamide 100 MG TABS Take 1 tablet (100 mg total) by mouth 2 (two) times daily. 180 tablet 1   latanoprost (XALATAN) 0.005 % ophthalmic solution Place 1 drop into both eyes at bedtime.     metFORMIN (GLUCOPHAGE) 500 MG tablet TAKE ONE TABLET BY MOUTH TWICE A DAY WITH A MEAL 60 tablet 0   nystatin cream (MYCOSTATIN) Apply 1 application topically 2 (two) times daily. Apply to affected area BID for up to 7 days. 30 g 1   ondansetron (ZOFRAN-ODT) 4 MG disintegrating tablet Take 1 tablet (4 mg total) by mouth every 8 (eight) hours as needed for nausea or vomiting. 20 tablet 11   pilocarpine (SALAGEN) 5 MG tablet TAKE ONE TABLET BY MOUTH TWICE A DAY 60 tablet 1   SUMAtriptan (IMITREX) 100 MG tablet Take 1 tablet (100 mg total) by mouth once as needed for migraine. May repeat in 2 hours if headache persists or recurs. 9 tablet 5   TURMERIC PO Take by mouth.     verapamil (CALAN-SR) 240 MG CR tablet TAKE ONE TABLET BY MOUTH AT BEDTIME 90 tablet 2   vortioxetine HBr (TRINTELLIX) 10 MG TABS tablet Take 1 tablet (10 mg total) by mouth daily. (Patient not taking: Reported on 02/20/2023) 90 tablet 0   No current facility-administered  medications for this visit.    Family History  Problem Relation Age of Onset   Sleep apnea Mother    Alcoholism Father    Cirrhosis Father    Alcohol abuse Father    Cancer Maternal Grandmother        unsure of type; died of other causes   Heart disease Maternal Aunt    Drug abuse Cousin     ROS: Constitutional: negative Genitourinary:negative  Exam:   BP (!) 144/102 (BP Location: Right Arm, Patient Position: Sitting, Cuff Size: Large)   Pulse 77   Ht 4' 11.75" (  1.518 m)   Wt 178 lb (80.7 kg)   LMP 05/02/2017   BMI 35.05 kg/m   Height: 4' 11.75" (151.8 cm)  General appearance: alert, cooperative and appears stated age Head: Normocephalic, without obvious abnormality, atraumatic Neck: no adenopathy, supple, symmetrical, trachea midline and thyroid normal to inspection and palpation Lungs: clear to auscultation bilaterally Breasts: normal appearance, no masses or tenderness Heart: regular rate and rhythm Abdomen: soft, non-tender; bowel sounds normal; no masses,  no organomegaly Extremities: extremities normal, atraumatic, no cyanosis or edema Skin: Skin color, texture, turgor normal. No rashes or lesions Lymph nodes: Cervical, supraclavicular, and axillary nodes normal. No abnormal inguinal nodes palpated Neurologic: Grossly normal   Pelvic: External genitalia:  no lesions              Urethra:  normal appearing urethra with no masses, tenderness or lesions              Bartholins and Skenes: normal                 Vagina: normal appearing vagina with normal color and no discharge, no lesions              Cervix: absent              Pap taken: No. Bimanual Exam:  Uterus:  uterus absent              Adnexa: no mass, fullness, tenderness               Rectovaginal: Confirms               Anus:  normal sphincter tone, no lesions  Chaperone, Ina Homes, CMA, was present for exam.  Assessment/Plan: 1. Well woman exam with routine gynecological exam (Primary) - Pap  smear 03/29/2017 - Mammogram 11/04/2021.  Pt aware this is due.   - Colonoscopy declined.   - Bone mineral density discussed.  Will order around age 69. - lab work:  None ordered except ca-125 - vaccines reviewed/updated.  Flu vaccine will be updated today.   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 Need for influenza vaccination - Flu vaccine trivalent PF, 6mos and older(Flulaval,Afluria,Fluarix,Fluzone)  4. Type 2 diabetes mellitus without complication, without long-term current use of insulin (HCC)  5. GAD (generalized anxiety disorder)  6. H/O: hysterectomy -  2019

## 2023-02-22 ENCOUNTER — Encounter (HOSPITAL_BASED_OUTPATIENT_CLINIC_OR_DEPARTMENT_OTHER): Payer: Self-pay | Admitting: Obstetrics & Gynecology

## 2023-02-22 ENCOUNTER — Encounter: Payer: Self-pay | Admitting: Neurology

## 2023-02-22 LAB — CA 125: Cancer Antigen (CA) 125: 15.9 U/mL (ref 0.0–38.1)

## 2023-02-22 MED ORDER — VERAPAMIL HCL ER 240 MG PO TBCR: 240.0000 mg | EXTENDED_RELEASE_TABLET | Freq: Every day | ORAL | 2 refills | Status: DC

## 2023-02-23 ENCOUNTER — Encounter: Payer: Self-pay | Admitting: Nurse Practitioner

## 2023-02-23 NOTE — Telephone Encounter (Signed)
 Care team updated and letter sent for eye exam notes.

## 2023-03-07 ENCOUNTER — Other Ambulatory Visit: Payer: Self-pay | Admitting: Neurology

## 2023-03-08 NOTE — Telephone Encounter (Signed)
 Last seen on 09/27/22 No follow up scheduled

## 2023-03-10 LAB — COLOGUARD: COLOGUARD: NEGATIVE

## 2023-03-23 ENCOUNTER — Encounter: Payer: Self-pay | Admitting: Neurology

## 2023-03-23 ENCOUNTER — Other Ambulatory Visit: Payer: Self-pay | Admitting: Neurology

## 2023-03-26 ENCOUNTER — Telehealth: Payer: Self-pay | Admitting: Neurology

## 2023-03-26 ENCOUNTER — Encounter: Payer: Self-pay | Admitting: Neurology

## 2023-03-26 DIAGNOSIS — F419 Anxiety disorder, unspecified: Secondary | ICD-10-CM

## 2023-03-26 NOTE — Telephone Encounter (Signed)
Appointment scheduled.

## 2023-03-27 NOTE — Telephone Encounter (Signed)
Pt Last Seen 09/27/2022 (Dr. Epimenio Foot) Upcoming Appointment 07/16/2023 (Dr. Epimenio Foot)  Atropine 1% Last Filled 06/07/2022 Escript 03/27/2023

## 2023-04-05 ENCOUNTER — Encounter: Payer: Self-pay | Admitting: Cardiology

## 2023-04-05 ENCOUNTER — Other Ambulatory Visit: Payer: Self-pay | Admitting: *Deleted

## 2023-04-05 MED ORDER — LACOSAMIDE 100 MG PO TABS: 1.0000 | ORAL_TABLET | Freq: Two times a day (BID) | ORAL | 1 refills | Status: DC

## 2023-04-05 NOTE — Telephone Encounter (Signed)
Pt sent mychart asking for refill on vimpat

## 2023-04-06 ENCOUNTER — Other Ambulatory Visit: Payer: Self-pay | Admitting: *Deleted

## 2023-04-06 DIAGNOSIS — E782 Mixed hyperlipidemia: Secondary | ICD-10-CM

## 2023-04-12 ENCOUNTER — Ambulatory Visit (HOSPITAL_BASED_OUTPATIENT_CLINIC_OR_DEPARTMENT_OTHER): Payer: 59 | Admitting: Family Medicine

## 2023-04-12 ENCOUNTER — Encounter (HOSPITAL_BASED_OUTPATIENT_CLINIC_OR_DEPARTMENT_OTHER): Payer: Self-pay | Admitting: Family Medicine

## 2023-04-12 ENCOUNTER — Encounter (HOSPITAL_COMMUNITY): Payer: Self-pay | Admitting: Licensed Clinical Social Worker

## 2023-04-12 ENCOUNTER — Ambulatory Visit (INDEPENDENT_AMBULATORY_CARE_PROVIDER_SITE_OTHER): Payer: 59 | Admitting: Licensed Clinical Social Worker

## 2023-04-12 VITALS — BP 118/81 | HR 74 | Ht 59.75 in | Wt 175.9 lb

## 2023-04-12 DIAGNOSIS — F411 Generalized anxiety disorder: Secondary | ICD-10-CM

## 2023-04-12 DIAGNOSIS — Z1231 Encounter for screening mammogram for malignant neoplasm of breast: Secondary | ICD-10-CM | POA: Diagnosis not present

## 2023-04-12 DIAGNOSIS — Z7984 Long term (current) use of oral hypoglycemic drugs: Secondary | ICD-10-CM | POA: Diagnosis not present

## 2023-04-12 DIAGNOSIS — E782 Mixed hyperlipidemia: Secondary | ICD-10-CM

## 2023-04-12 DIAGNOSIS — H348112 Central retinal vein occlusion, right eye, stable: Secondary | ICD-10-CM | POA: Diagnosis not present

## 2023-04-12 DIAGNOSIS — F4310 Post-traumatic stress disorder, unspecified: Secondary | ICD-10-CM | POA: Insufficient documentation

## 2023-04-12 DIAGNOSIS — F332 Major depressive disorder, recurrent severe without psychotic features: Secondary | ICD-10-CM

## 2023-04-12 DIAGNOSIS — E119 Type 2 diabetes mellitus without complications: Secondary | ICD-10-CM | POA: Diagnosis not present

## 2023-04-12 LAB — BASIC METABOLIC PANEL
BUN/Creatinine Ratio: 14 (ref 9–23)
BUN: 9 mg/dL (ref 6–24)
CO2: 22 mmol/L (ref 20–29)
Calcium: 10.2 mg/dL (ref 8.7–10.2)
Chloride: 104 mmol/L (ref 96–106)
Creatinine, Ser: 0.66 mg/dL (ref 0.57–1.00)
Glucose: 112 mg/dL — ABNORMAL HIGH (ref 70–99)
Potassium: 4.5 mmol/L (ref 3.5–5.2)
Sodium: 142 mmol/L (ref 134–144)
eGFR: 108 mL/min/{1.73_m2} (ref >59)

## 2023-04-12 LAB — HEMOGLOBIN A1C
Est. average glucose Bld gHb Est-mCnc: 131 mg/dL
Hgb A1c MFr Bld: 6.2 % — ABNORMAL HIGH (ref 4.8–5.6)

## 2023-04-12 LAB — LIPID PANEL
Chol/HDL Ratio: 4 {ratio} (ref 0.0–4.4)
Cholesterol, Total: 180 mg/dL (ref 100–199)
HDL: 45 mg/dL (ref >39)
LDL Chol Calc (NIH): 82 mg/dL (ref 0–99)
Triglycerides: 325 mg/dL — ABNORMAL HIGH (ref 0–149)
VLDL Cholesterol Cal: 53 mg/dL — ABNORMAL HIGH (ref 5–40)
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
## 2023-04-12 NOTE — Progress Notes (Signed)
 New Patient Office Visit  Subjective:   Angel Rodriguez 1976/02/07 04/12/2023  Chief Complaint  Patient presents with   New Patient (Initial Visit)    Patient is here today to get established with the practice. Has concerns about her diabetes.    HPI: Angel Rodriguez presents today to establish care at [facility] at [facility]. Introduced to publishing rights manager role and practice setting.  All questions answered.   Last PCP: Heather  Hanford, NP Concerns: See below    DIABETES MELLITUS: Angel Rodriguez presents for the medical management of diabetes.  Current diabetes medication regimen: Metformin  500mg  BID Patient is  adhering to a diabetic diet.  Patient is  exercising regularly.  Patient is not checking BS regularly. Avg:  Patient is  checking their feet regularly.  Denies polydipsia, polyphagia, polyuria, open wounds or ulcers on feet.   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  She was previously on dorzolamide -timolol  and other eyedrops for treatment of central retinal vein occlusion with a prior ophthalmologist.  Patient is requesting a referral to reestablish care with an eye provider locally.  Lab Results  Component Value Date   HGBA1C 6.2 04/24/2022    04/12/2023- Foot Exam  Lab Results  Component Value Date   MICROALBUR 30 04/24/2022    Wt Readings from Last 3 Encounters:  04/12/23 175 lb 14.4 oz (79.8 kg)  02/20/23 178 lb (80.7 kg)  09/27/22 180 lb 8 oz (81.9 kg)   HYPERLIPIDEMIA: Angel Rodriguez presents for the medical management of hyperlipidemia.  Patient's current HLD regimen is: Atorvastatin  80mg  Patient is  currently taking prescribed medications for HLD.  Adhering to heathy diet: Yes Denies myalgias.  Lab Results  Component Value Date   CHOL 204 (H) 10/13/2021   HDL 63 10/13/2021   LDLCALC 107 (H)  10/13/2021   TRIG 168 (H) 10/13/2021   CHOLHDL 3.2 10/13/2021   The 10-year ASCVD risk score (Arnett DK, et al., 2019) is: 1.5%   Values used to calculate the score:     Age: 48 years     Sex: Female     Is Non-Hispanic African American: No     Diabetic: Yes     Tobacco smoker: No     Systolic Blood Pressure: 118 mmHg     Is BP treated: No     HDL Cholesterol: 63 mg/dL     Total Cholesterol: 204 mg/dL   The following portions of the patient's history were reviewed and updated as appropriate: past medical history, past surgical history, family history, social history, allergies, medications, and problem list.   Patient Active Problem List   Diagnosis Date Noted   Mixed hyperlipidemia 05/25/2022   Type 2 diabetes mellitus without complication, without long-term current use of insulin (HCC) 08/16/2020   OSA (obstructive sleep apnea) 08/16/2020   Ingrowing toenail of left foot 01/20/2020   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   Pain in both hands 12/08/2019   GAD (generalized anxiety disorder) 09/03/2018   Panic disorder 06/06/2018   Acute left-sided low back pain without sciatica 05/27/2018   Mental confusion/ slurred speech 05/09/2018   High risk medication use 05/09/2018   Major depressive disorder, single episode, moderate (HCC) 05/09/2018   Bilateral hand numbness 03/18/2018   Vitamin D  deficiency 03/18/2018   Thrombocytopenia (HCC) 03/14/2018   Dysarthria 03/14/2018   Brain lesion 03/14/2018  Right sided weakness 03/13/2018   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   Abnormal MRI of head 01/21/2016   Vascular headache 09/15/2015   Insomnia 09/15/2015   Intractable chronic migraine without aura and with status migrainosus 07/08/2015   Neck pain 06/24/2015   Seizure (HCC) 06/24/2015   Central retinal vein occlusion 12/11/2011   Cotton wool exudates 12/11/2011   Cystoid macular edema 12/11/2011   Past Medical History:  Diagnosis Date   Anxiety    Blind right  eye 2009   swelling & pressure   Cluster headaches    started after seizure and migraines   Complication of anesthesia    trouble with short term memory after surgeries   COVID-19    Depression    Fibroids    s/p TLH, bilateral salpingectomy   GERD (gastroesophageal reflux disease)    patient thinks due to medications   History of anemia    prior to hysterectomy   History of pneumonia    History of trichomoniasis    Seizures (HCC) x 1 2-3 yrs ago none since   once saw neurologist, full body brain MRI and another 6 months lster   Status post right oophorectomy    Thrombocytopenia (HCC)    sees Kale with Heme Onc   Vasculitis (HCC)    neuro   Past Surgical History:  Procedure Laterality Date   CYSTOSCOPY N/A 07/31/2017   Procedure: CYSTOSCOPY;  Surgeon: Cleotilde Ronal RAMAN, MD;  Location: [facility];  Service: Gynecology;  Laterality: N/A;   ENDOMETRIAL ABLATION     EYE SURGERY     laser - removed blood from right eye   HYSTEROSCOPY WITH D & C     LAPAROSCOPY N/A 09/19/2017   Procedure: LAPAROSCOPY DIAGNOSTIC WITH RIGHT OOPHERECTOMY, LYSIS OF ADHESIONS;  Surgeon: Jertson, Jill Evelyn, MD;  Location: [facility];  Service: Gynecology;  Laterality: N/A;   OMENTECTOMY N/A 11/21/2017   Procedure: OMENTECTOMY AND PELVIC WASHINGS;  Surgeon: Anitra Freddy NOVAK, MD;  Location: [facility];  Service: Gynecology;  Laterality: N/A;   ROBOTIC ASSISTED SALPINGO OOPHERECTOMY Left 11/21/2017   Procedure: XI ROBOTIC ASSISTED LEFT SALPINGO OOPHORECTOMY;  Surgeon: Anitra Freddy NOVAK, MD;  Location: [facility];  Service: Gynecology;  Laterality: Left;   TOTAL LAPAROSCOPIC HYSTERECTOMY WITH SALPINGECTOMY Bilateral 07/31/2017   Procedure: TOTAL LAPAROSCOPIC HYSTERECTOMY WITH SALPINGECTOMY;  Surgeon: Cleotilde Ronal RAMAN, MD;  Location: [facility];  Service: Gynecology;  Laterality: Bilateral;  20 week size uterus/ Lashann Hagg bag in room/ need 4.5 hours   TUBAL LIGATION     WISDOM TOOTH EXTRACTION     Family History  Problem Relation  Age of Onset   Sleep apnea Mother    Alcoholism Father    Cirrhosis Father    Alcohol abuse Father    Cancer Maternal Grandmother        unsure of type; died of other causes   Heart disease Maternal Aunt    Drug abuse Cousin    Social History   Socioeconomic History   Marital status: Single    Spouse name: Not on file   Number of children: 3   Years of education: Not on file   Highest education level: Associate degree: occupational, scientist, product/process development, or vocational program  Occupational History   Not on file  Tobacco Use   Smoking status: Former    Current packs/day: 0.00    Types: Cigarettes, E-cigarettes    Start date: 05/05/1998    Quit date: 05/05/2018    Years since quitting:  4.9   Smokeless tobacco: Never  Vaping Use   Vaping status: Former  Substance and Sexual Activity   Alcohol use: Not Currently   Drug use: No   Sexual activity: Yes    Birth control/protection: Surgical    Comment: hysterectomy  Other Topics Concern   Not on file  Social History Narrative   Not on file   Social Drivers of Health   Financial Resource Strain: Medium Risk (04/12/2023)   Overall Financial Resource Strain (CARDIA)    Difficulty of Paying Living Expenses: Somewhat hard  Food Insecurity: Food Insecurity Present (04/12/2023)   Hunger Vital Sign    Worried About Running Out of Food in the Last Year: Often true    Ran Out of Food in the Last Year: Sometimes true  Transportation Needs: No Transportation Needs (04/12/2023)   PRAPARE - Administrator, Civil Service (Medical): No    Lack of Transportation (Non-Medical): No  Physical Activity: Inactive (04/12/2023)   Exercise Vital Sign    Days of Exercise per Week: 0 days    Minutes of Exercise per Session: 0 min  Stress: Stress Concern Present (04/12/2023)   [employer] of Occupational Health - Occupational Stress Questionnaire    Feeling of Stress : Very much  Social Connections: Socially Isolated (04/12/2023)   Social Connection  and Isolation Panel [NHANES]    Frequency of Communication with Friends and Family: Never    Frequency of Social Gatherings with Friends and Family: Never    Attends Religious Services: Never    Database Administrator or Organizations: No    Attends Banker Meetings: Never    Marital Status: Never married  Intimate Partner Violence: Not At Risk (04/12/2023)   Humiliation, Afraid, Rape, and Kick questionnaire    Fear of Current or Ex-Partner: No    Emotionally Abused: No    Physically Abused: No    Sexually Abused: No   Outpatient Medications Prior to Visit  Medication Sig Dispense Refill   Accu-Chek Softclix Lancets lancets Use as instructed 100 each 12   acetaminophen -codeine  (TYLENOL  #3) 300-30 MG tablet TAKE ONE TABLET BY MOUTH UP TO FOUR TIMES A DAY AS NEEDED 28 tablet 2   Ascorbic Acid (VITAMIN C) 1000 MG tablet Take 1,000 mg by mouth daily.     atorvastatin  (LIPITOR) 80 MG tablet Take 1 tablet (80 mg total) by mouth daily. 90 tablet 3   atropine  1 % ophthalmic solution INSTILL ONE DROP INTO THE RIGHT EYE THREE TIMES DAILY 5 mL 0   b complex vitamins capsule Take 1 capsule by mouth daily.     Blood Glucose Monitoring Suppl (ACCU-CHEK GUIDE ME) w/Device KIT USE TO CHECK FASTING BLOOD SUGAR AND 2 HOURS AFTER LARGEST MEAL 1 kit 0   calcium  carbonate (TUMS EX) 750 MG chewable tablet Chew 2 tablets by mouth daily as needed for heartburn.      clonazePAM  (KLONOPIN ) 1 MG tablet Take 1 tablet (1 mg total) by mouth 2 (two) times daily. 180 tablet 1   dorzolamide -timolol  (COSOPT ) 22.3-6.8 MG/ML ophthalmic solution Place 1 drop into the right eye 2 (two) times daily.      fluticasone  (FLONASE ) 50 MCG/ACT nasal spray ADMINISTER 1 TO 2 SPRAYS INTO BOTH NOSTRILS DAILY AS NEEDED FOR ALLERGIES 16 mL 0   glucose blood (ACCU-CHEK GUIDE) test strip Use to check fasting glucose and 2 hours after largest meal 100 each 12   Lacosamide  100 MG TABS Take 1 tablet (100  mg total) by mouth 2 (two)  times daily. 180 tablet 1   metFORMIN  (GLUCOPHAGE ) 500 MG tablet TAKE ONE TABLET BY MOUTH TWICE A DAY WITH A MEAL 60 tablet 0   nystatin  cream (MYCOSTATIN ) Apply 1 application topically 2 (two) times daily. Apply to affected area BID for up to 7 days. 30 g 1   ondansetron  (ZOFRAN -ODT) 4 MG disintegrating tablet Take 1 tablet (4 mg total) by mouth every 8 (eight) hours as needed for nausea or vomiting. 20 tablet 11   pilocarpine  (SALAGEN ) 5 MG tablet TAKE ONE TABLET BY MOUTH TWICE A DAY 60 tablet 0   SUMAtriptan  (IMITREX ) 100 MG tablet Take 1 tablet (100 mg total) by mouth once as needed for migraine. May repeat in 2 hours if headache persists or recurs. 9 tablet 5   verapamil  (CALAN -SR) 240 MG CR tablet Take 1 tablet (240 mg total) by mouth at bedtime. 90 tablet 2   brimonidine  (ALPHAGAN  P) 0.1 % SOLN Place 1 drop into the right eye 3 (three) times daily.  (Patient not taking: Reported on 09/27/2022)     latanoprost  (XALATAN ) 0.005 % ophthalmic solution Place 1 drop into both eyes at bedtime. (Patient not taking: Reported on 04/12/2023)     TURMERIC PO Take by mouth.     vortioxetine  HBr (TRINTELLIX ) 10 MG TABS tablet Take 1 tablet (10 mg total) by mouth daily. (Patient not taking: Reported on 09/27/2022) 90 tablet 0   No facility-administered medications prior to visit.   Allergies  Allergen Reactions   Ivp Dye [Iodinated Contrast Media] Hives, Itching and Swelling   Bee Venom Swelling   Buspar  [Buspirone ] Other (See Comments)    ROS: A complete ROS was performed with pertinent positives/negatives noted in the HPI. The remainder of the ROS are negative.   Objective:   Today's Vitals   04/12/23 0848  BP: 118/81  Pulse: 74  SpO2: 98%  Weight: 175 lb 14.4 oz (79.8 kg)  Height: 4' 11.75 (1.518 m)    GENERAL: Well-appearing, in NAD. Well nourished.  SKIN: Pink, warm and dry.  Head: Normocephalic. NECK: Trachea midline. Full ROM w/o pain or tenderness.  RESPIRATORY: Chest wall  symmetrical. Respirations even and non-labored.  CARDIAC: S1, S2 present, regular rate and rhythm without murmur or gallops. Peripheral pulses 2+ bilaterally.  MSK: Muscle tone and strength appropriate for age.  EXTREMITIES: Without clubbing, cyanosis, or edema.  NEUROLOGIC: No motor or sensory deficits. Steady, even gait. C2-C12 intact.  PSYCH/MENTAL STATUS: Alert, oriented x 3. Cooperative, slightly tearful but appropriate mood and affect.   Diabetic Foot Exam - Simple   Simple Foot Form Diabetic Foot exam was performed with the following findings: Yes 04/12/2023  9:30 AM  Visual Inspection No deformities, no ulcerations, no other skin breakdown bilaterally: Yes Sensation Testing Intact to touch and monofilament testing bilaterally: Yes Pulse Check Posterior Tibialis and Dorsalis pulse intact bilaterally: Yes Comments      Health Maintenance Due  Topic Date Due   OPHTHALMOLOGY EXAM  Never done   Diabetic kidney evaluation - eGFR measurement  09/20/2022   HEMOGLOBIN A1C  10/23/2022   Diabetic kidney evaluation - Urine ACR  04/25/2023        Assessment & Plan:  1. Type 2 diabetes mellitus without complication, without long-term current use of insulin (HCC) (Primary) We will obtain fasting A1c and BMP for evaluation of control of type 2 diabetes currently on metformin .  Referral also placed to ophthalmology for diabetic eye exam.  Will  continue on metformin  currently until results are available and titration of medication if needed.  Discussed healthy diet, lifestyle and exercise with patient. - Basic metabolic panel - Hemoglobin A1c - Ambulatory referral to Ophthalmology  2. Mixed hyperlipidemia Will obtain fasting lipid panel to evaluate control of HLD on atorvastatin  80 mg daily.  Will titrate medications as needed pending result.  Discussed heart healthy diet with patient. - Lipid panel  3. Central retinal vein occlusion of right eye, unspecified complication  status Referral placed to ophthalmology to establish care for management of central retinal vein occlusion of her right eye. - Ambulatory referral to Ophthalmology  4. Breast cancer screening by mammogram - MM 3D SCREENING MAMMOGRAM BILATERAL BREAST; Future  Patient to reach out to office if new, worrisome, or unresolved symptoms arise or if no improvement in patient's condition. Patient verbalized understanding and is agreeable to treatment plan. All questions answered to patient's satisfaction.    Return in about 4 months (around 08/10/2023) for DIABETES CHECK UP.    Thersia Schuyler Stark, [place]

## 2023-04-13 ENCOUNTER — Encounter (HOSPITAL_BASED_OUTPATIENT_CLINIC_OR_DEPARTMENT_OTHER): Payer: Self-pay | Admitting: Family Medicine

## 2023-04-13 LAB — LIPID PANEL
Chol/HDL Ratio: 4.2 {ratio} (ref 0.0–4.4)
Cholesterol, Total: 184 mg/dL (ref 100–199)
HDL: 44 mg/dL (ref >39)
LDL Chol Calc (NIH): 91 mg/dL (ref 0–99)
Triglycerides: 296 mg/dL — ABNORMAL HIGH (ref 0–149)
VLDL Cholesterol Cal: 49 mg/dL — ABNORMAL HIGH (ref 5–40)

## 2023-04-13 NOTE — Progress Notes (Signed)
 Hi Angel Rodriguez, Your metabolic panel is unremarkable except for elevated sugar which is to be expected with diabetes.  Your A1c was not significantly worsened and was at 6.2 which is well-controlled for diabetes.  Your triglyceride level was significantly high and can be attributed to type 2 diabetes.  We could increase your metformin  to 1500 mg or 2000 mg a day to see if this would help to lower your triglycerides.  Additionally, starting a omega-3 fish oil over-the-counter daily at 1000 mg can also help this level.  Please let me know if you would like your metformin  increased and would be happy to send this in for you.

## 2023-04-16 ENCOUNTER — Other Ambulatory Visit: Payer: Self-pay | Admitting: Neurology

## 2023-04-16 ENCOUNTER — Other Ambulatory Visit (HOSPITAL_BASED_OUTPATIENT_CLINIC_OR_DEPARTMENT_OTHER): Payer: Self-pay | Admitting: Family Medicine

## 2023-04-16 ENCOUNTER — Other Ambulatory Visit: Payer: Self-pay | Admitting: Nurse Practitioner

## 2023-04-16 ENCOUNTER — Other Ambulatory Visit: Payer: Self-pay

## 2023-04-16 DIAGNOSIS — F4323 Adjustment disorder with mixed anxiety and depressed mood: Secondary | ICD-10-CM | POA: Diagnosis not present

## 2023-04-16 DIAGNOSIS — E119 Type 2 diabetes mellitus without complications: Secondary | ICD-10-CM

## 2023-04-16 MED ORDER — METFORMIN HCL 500 MG PO TABS
ORAL_TABLET | ORAL | 3 refills | Status: DC
Start: 2023-04-16 — End: 2023-10-15

## 2023-04-16 MED ORDER — EZETIMIBE 10 MG PO TABS: 10.0000 mg | ORAL_TABLET | Freq: Every day | ORAL | 3 refills | Status: AC

## 2023-04-17 ENCOUNTER — Encounter (HOSPITAL_COMMUNITY): Payer: Self-pay | Admitting: Physician Assistant

## 2023-04-17 ENCOUNTER — Ambulatory Visit (INDEPENDENT_AMBULATORY_CARE_PROVIDER_SITE_OTHER): Payer: 59 | Admitting: Physician Assistant

## 2023-04-17 ENCOUNTER — Other Ambulatory Visit: Payer: Self-pay

## 2023-04-17 VITALS — BP 128/80 | HR 82 | Temp 98.0°F | Ht 60.0 in | Wt 174.8 lb

## 2023-04-17 DIAGNOSIS — F4310 Post-traumatic stress disorder, unspecified: Secondary | ICD-10-CM

## 2023-04-17 DIAGNOSIS — F411 Generalized anxiety disorder: Secondary | ICD-10-CM | POA: Diagnosis not present

## 2023-04-17 DIAGNOSIS — F332 Major depressive disorder, recurrent severe without psychotic features: Secondary | ICD-10-CM

## 2023-04-17 MED ORDER — ATOMOXETINE HCL 40 MG PO CAPS
40.0000 mg | ORAL_CAPSULE | Freq: Every day | ORAL | 1 refills | Status: DC
Start: 2023-04-17 — End: 2023-07-06
  Filled 2023-04-17: qty 30, 30d supply, fill #0

## 2023-04-17 MED ORDER — VORTIOXETINE HBR 20 MG PO TABS
20.0000 mg | ORAL_TABLET | Freq: Every day | ORAL | 1 refills | Status: DC
Start: 2023-04-17 — End: 2023-04-24
  Filled 2023-04-17: qty 30, 30d supply, fill #0
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
## 2023-04-18 ENCOUNTER — Other Ambulatory Visit: Payer: Self-pay

## 2023-04-18 ENCOUNTER — Other Ambulatory Visit (HOSPITAL_COMMUNITY): Payer: Self-pay

## 2023-04-18 MED ORDER — ATROPINE SULFATE 1 % OP SOLN
OPHTHALMIC | 3 refills | Status: DC
  Filled 2023-04-18: qty 5, 28d supply, fill #0

## 2023-04-18 NOTE — Telephone Encounter (Signed)
Do you want to Renew Script or Does the patient need to get from Ophthalmologist?

## 2023-04-23 ENCOUNTER — Telehealth (HOSPITAL_COMMUNITY): Payer: Self-pay

## 2023-04-23 ENCOUNTER — Other Ambulatory Visit: Payer: Self-pay

## 2023-04-23 DIAGNOSIS — F4323 Adjustment disorder with mixed anxiety and depressed mood: Secondary | ICD-10-CM | POA: Diagnosis not present

## 2023-04-23 NOTE — Telephone Encounter (Signed)
Hello,   Pt/ Pharmacy is requesting a refill of vortioxetine HBr (TRINTELLIX) 20 MG TABS tablet

## 2023-04-24 ENCOUNTER — Other Ambulatory Visit: Payer: Self-pay

## 2023-04-24 ENCOUNTER — Inpatient Hospital Stay (HOSPITAL_BASED_OUTPATIENT_CLINIC_OR_DEPARTMENT_OTHER): Admission: RE | Admit: 2023-04-24 | Payer: 59 | Source: Ambulatory Visit | Admitting: Radiology

## 2023-04-24 ENCOUNTER — Other Ambulatory Visit (HOSPITAL_COMMUNITY): Payer: Self-pay | Admitting: Physician Assistant

## 2023-04-24 DIAGNOSIS — F332 Major depressive disorder, recurrent severe without psychotic features: Secondary | ICD-10-CM

## 2023-04-24 DIAGNOSIS — F411 Generalized anxiety disorder: Secondary | ICD-10-CM

## 2023-04-24 MED ORDER — VORTIOXETINE HBR 20 MG PO TABS
20.0000 mg | ORAL_TABLET | Freq: Every day | ORAL | 1 refills | Status: DC
Start: 2023-04-24 — End: 2023-05-29
  Filled 2023-04-24 – 2023-05-14 (×4): qty 30, 30d supply, fill #0
  Filled 2023-05-16: qty 30, 30d supply, fill #1
  Filled 2023-05-16: qty 30, 30d supply, fill #0

## 2023-04-24 NOTE — Progress Notes (Signed)
Medication sent to [facility].

## 2023-04-24 NOTE — Telephone Encounter (Signed)
Message acknowledged and reviewed. Medication sent to [facility].

## 2023-05-01 ENCOUNTER — Other Ambulatory Visit: Payer: Self-pay

## 2023-05-01 ENCOUNTER — Telehealth (HOSPITAL_COMMUNITY): Payer: Self-pay

## 2023-05-01 DIAGNOSIS — F419 Anxiety disorder, unspecified: Secondary | ICD-10-CM

## 2023-05-01 MED ORDER — CLONAZEPAM 1 MG PO TABS
1.0000 mg | ORAL_TABLET | Freq: Two times a day (BID) | ORAL | 0 refills | Status: DC
Start: 2023-05-01 — End: 2023-05-31

## 2023-05-01 NOTE — Telephone Encounter (Signed)
 Called pt at [phone]. She accepted sooner appt 05/08/23 at 930am with Dr. Epimenio Foot. I scheduled and cx other appt. She only has 2 tablets left of clonazepam. Requesting refill until her appt. Aware  I will send to MD to see if he approves.   Last refilled 01/30/24 #[id number].

## 2023-05-01 NOTE — Telephone Encounter (Signed)
 Hello,   Pt states that that she is out of her medications on telephone, states that she hasn't been able/ or has had trouble picking them up, tried to call back PT no response.

## 2023-05-01 NOTE — Telephone Encounter (Signed)
 This encounter was created in error - please disregard.

## 2023-05-02 ENCOUNTER — Inpatient Hospital Stay (HOSPITAL_BASED_OUTPATIENT_CLINIC_OR_DEPARTMENT_OTHER): Admission: RE | Admit: 2023-05-02 | Payer: 59 | Source: Ambulatory Visit | Admitting: Radiology

## 2023-05-02 ENCOUNTER — Other Ambulatory Visit: Payer: Self-pay

## 2023-05-03 ENCOUNTER — Telehealth (HOSPITAL_COMMUNITY): Payer: Self-pay | Admitting: *Deleted

## 2023-05-03 ENCOUNTER — Other Ambulatory Visit: Payer: Self-pay

## 2023-05-03 NOTE — Telephone Encounter (Signed)
 Received communication thru [employer] requetsing additional information on the PA for her Trintellex.Faxed requested information to them and waiting on their response. Left a voice mail for patient with the current status of her PA and directed her to call tomorrow to have staff person check to see if her insurance has responded after Clinical research associate faxed the additional information the insurance company requested.

## 2023-05-03 NOTE — Telephone Encounter (Signed)
 Patient called requesting her PA be completed for her Trintellex. She has only a few of the samples she was given and needs to pick up her rx. I will submit the request thru covermymeds and call her to update her though response may take more than 24 hours.

## 2023-05-04 ENCOUNTER — Other Ambulatory Visit: Payer: Self-pay

## 2023-05-08 ENCOUNTER — Telehealth (HOSPITAL_COMMUNITY): Payer: Self-pay | Admitting: *Deleted

## 2023-05-08 ENCOUNTER — Encounter: Payer: Self-pay | Admitting: Neurology

## 2023-05-08 ENCOUNTER — Other Ambulatory Visit: Payer: Self-pay

## 2023-05-08 ENCOUNTER — Ambulatory Visit: Payer: 59 | Admitting: Neurology

## 2023-05-08 VITALS — BP 112/76 | HR 57 | Ht 60.0 in | Wt 177.5 lb

## 2023-05-08 DIAGNOSIS — F419 Anxiety disorder, unspecified: Secondary | ICD-10-CM

## 2023-05-08 DIAGNOSIS — G441 Vascular headache, not elsewhere classified: Secondary | ICD-10-CM | POA: Diagnosis not present

## 2023-05-08 DIAGNOSIS — R569 Unspecified convulsions: Secondary | ICD-10-CM | POA: Diagnosis not present

## 2023-05-08 DIAGNOSIS — G44221 Chronic tension-type headache, intractable: Secondary | ICD-10-CM | POA: Diagnosis not present

## 2023-05-08 DIAGNOSIS — I776 Arteritis, unspecified: Secondary | ICD-10-CM | POA: Diagnosis not present

## 2023-05-08 DIAGNOSIS — G4489 Other headache syndrome: Secondary | ICD-10-CM | POA: Diagnosis not present

## 2023-05-08 DIAGNOSIS — M542 Cervicalgia: Secondary | ICD-10-CM | POA: Diagnosis not present

## 2023-05-08 DIAGNOSIS — Z79899 Other long term (current) drug therapy: Secondary | ICD-10-CM | POA: Diagnosis not present

## 2023-05-08 DIAGNOSIS — G4734 Idiopathic sleep related nonobstructive alveolar hypoventilation: Secondary | ICD-10-CM

## 2023-05-08 MED ORDER — METHYLPREDNISOLONE ACETATE 80 MG/ML IJ SUSP
80.0000 mg | Freq: Once | INTRAMUSCULAR | Status: AC
Start: 2023-05-08 — End: 2023-05-08
  Administered 2023-05-08: 80 mg via INTRAMUSCULAR

## 2023-05-08 MED ORDER — BUPIVACAINE HCL (PF) 0.5 % IJ SOLN
2.0000 mL | Freq: Once | INTRAMUSCULAR | Status: AC
Start: 2023-05-08 — End: ?

## 2023-05-08 MED ORDER — LIDOCAINE HCL 2 % IJ SOLN
2.0000 mL | Freq: Once | INTRAMUSCULAR | Status: AC
Start: 2023-05-08 — End: ?

## 2023-05-08 NOTE — Progress Notes (Signed)
 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  CNS vasculitis,  Seizures:0 to report recently, well controlled last one 2-3 yrs ago.  Vascular headache:13 triggers:stress, light, sound:pt reported pain can be anywhere on her body and sporadic specifically bilateral leg tingly, back pain, whole body ached. Pt reported having insomnia recently    HISTORY OF PRESENT ILLNESS:  Angel Rodriguez is a 48 y.o. woman with frequent headaches and h/o seizure.  Update 05/08/2023: She is on Rituxan for CNS vasculitis. Last infusion was November 2024.   She felt she had some reaction to that one and needed the rate slowed and took more Benadryl.  She slept a lot the next day   She has done well and has less eye pain/pressure.        Over the last month she has had more headaches.  These are severe and are worse during the night.  She feels pressure in the eyes, right more than left, and pain on the right side of her face.  Sometimes she has visua chanes.   Right eye is often red.  In the past, when the right-sided headaches associated with scleral changes in the eye have worsened, sphenopalatine ganglion blocks have been very helpful with complete elimination of the pain for many months.  Trigger point injections have helped which she has neck pain as well.  Sumatriptan helps sometimes  She has not tried an anti-CGRP agent and I will give her some samples.    She denies any visual changes.   She has numbness in hr hands but no weakness.  The numbness is worse on the right.   It is in the palms more than dorsum.  Shaking her arms help.       She has no recent seizure.   First one was 2016 and she had several when med's were withdrawn so was restarted.  Her last  one was April 2021.  She is on Vimpat.    Her depression is doing worse.  She was on Trintellix and clonazepam for anxiety and depression and felt better than she has done on the more recent medications (Aomoxetine).  . Insurance has not approved Trintellix for her..   I have recommended nefazodone.  Her psychiatrist also was considering trazodone.  She has not tried either of these.  She has no OSA on PSG 10/20/2020   The study found nocturnal hypoxemia and frequent bigeminy/trigeminy.    Nocturia O2 was started and she feels better.   She saw pulmonary.    PFTs were normal.      She saw cardiology.   Echo showed mild diastoic dysfunction.    Placed on a statin.      PSG 10/20/2021: No significant OSA was noted 44% of the night was with SaO2 80-89%     ---- nocturnal O2 recommended. 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  She had a second opinion from [place].  Changes in her seizure medications were made but no opinion about the possibility of vasculitis.  She was placed on prednisone 60 mg and titrating down in July 2021.  She had improvement of headaches and other symptoms.  MRI 10/15/2019 showed:   "Resolution of a region of T2 and FLAIR signal within the posterior frontal cortical and subcortical brain on the study of May. One could question slight increased prominence of a T2 and FLAIR focus without enhancement in the subcortical brain at the bottom of a left frontal sulcus, axial FLAIR image 19. No other new or potentially progressive finding. Minimal small foci of cortical and subcortical enhancement in the left frontal region. I think the differential diagnosis for this case remains that of a vasculitis syndrome, venous thrombotic syndrome, autoimmune syndrome  or demyelinating syndrome. I do not think we are dealing with tumor or arterial infarctions. "  MRI brain 07/06/2018 personally reviewed and compared to studies from January 2020.  It shows 2 foci in the anterior left frontal lobe.  One was mildly increased in size compared to the January MRI and one was decreased in size.  Another focus seen on the previous MRI in the left parasagittal frontal lobe has resolved.  MRI of the cervical and thoracic spine 03/25/2018 showed normal spinal cord and mild cervical degenerative changes.  MRI of the brain from 06/22/2022. It shows 2 T2/FLAIR hyperintense foci in the left frontal lobe, both in the subcortical region. These are consistent with her remote strokes. There were no new findings. No enhancing lesions. She also appears to have a small left frontal arachnoid cyst that would be asymptomatic.   Labs: CSF 03/25/2018 showed 10 white blood cells (lymphocytes), 3 red cells, normal glucose, protein oligoclonal bands, VDRL.  In 03/2018, negative ANA, cANCA and pANCA, weakly positive ANCA proteinase 3, normal ACE level. ESR 10.  HIV was negative 09/22/2017.  Hepatitis C was negative 04/2016     REVIEW OF SYSTEMS: Constitutional: No fevers, chills, sweats, or change in appetite.  She has insomnia. Eyes: as above Ear, nose and throat: No hearing loss, ear pain, nasal congestion, sore throat Cardiovascular: No chest pain, palpitations Respiratory:  No shortness of breath at rest or with exertion.   No wheezes GastrointestinaI: No nausea, vomiting, diarrhea, abdominal pain, fecal incontinence Genitourinary:  No dysuria, urinary retention or frequency.  No nocturia.   Possible ovarian cancer Musculoskeletal:  No neck pain, back pain Integumentary: No rash, pruritus, skin lesions Neurological: as above Psychiatric: No depression at this time.  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 Allergic/Immunologic: No itchy/runny eyes, nasal congestion, recent allergic reactions, rashes  ALLERGIES: Allergies  Allergen Reactions   Ivp Dye [Iodinated Contrast Media] Hives, Itching and Swelling   Bee Venom Swelling   Buspar [Buspirone] Other (See Comments)    HOME MEDICATIONS:  Current Outpatient Medications:    Accu-Chek Softclix Lancets lancets, Use as instructed, Disp: 100 each, Rfl: 12   acetaminophen-codeine (TYLENOL #3) 300-30 MG tablet, TAKE ONE TABLET BY MOUTH UP TO FOUR TIMES A DAY AS NEEDED, Disp: 28 tablet, Rfl: 2   Ascorbic Acid (VITAMIN C) 1000 MG tablet, Take 1,000 mg by mouth daily., Disp: , Rfl:    atomoxetine (STRATTERA) 40 MG capsule, Take 1 capsule (40 mg total) by mouth daily., Disp: 30 capsule, Rfl: 1   atorvastatin (LIPITOR) 80 MG tablet, Take 1 tablet (80 mg total) by mouth daily., Disp:  90 tablet, Rfl: 3   atropine 1 % ophthalmic solution, One drop into affected eye up to tid, Disp: 5 mL, Rfl: 3   b complex vitamins capsule, Take 1 capsule by mouth daily., Disp: , Rfl:    Blood Glucose Monitoring Suppl (ACCU-CHEK GUIDE ME) w/Device KIT, USE TO CHECK FASTING BLOOD SUGAR AND 2 HOURS AFTER LARGEST MEAL, Disp: 1 kit, Rfl: 0   calcium carbonate (TUMS EX) 750 MG chewable tablet, Chew 2 tablets by mouth daily as needed for heartburn. , Disp: , Rfl:    clonazePAM (KLONOPIN) 1 MG tablet, Take 1 tablet (1 mg total) by mouth 2 (two) times daily., Disp: 60 tablet, Rfl: 0   dorzolamide-timolol (COSOPT) 22.3-6.8 MG/ML ophthalmic solution, Place 1 drop into the right eye 2 (two) times daily. , Disp: , Rfl:    ezetimibe (ZETIA) 10 MG tablet, Take 1 tablet (10 mg total) by mouth daily., Disp: 90 tablet, Rfl: 3   fluticasone (FLONASE) 50 MCG/ACT nasal spray, USE 1 TO 2 SPRAYS INTO BOTH NOSTRILS DAILY AS NEEDED FOR ALLERGIES, Disp: 16 mL,  Rfl: 0   glucose blood (ACCU-CHEK GUIDE) test strip, Use to check fasting glucose and 2 hours after largest meal, Disp: 100 each, Rfl: 12   Lacosamide 100 MG TABS, Take 1 tablet (100 mg total) by mouth 2 (two) times daily., Disp: 180 tablet, Rfl: 1   metFORMIN (GLUCOPHAGE) 500 MG tablet, Take 2 tablets (1000mg )  by mouth in the morning and 1 tablet (500mg ) by mouth in the evening., Disp: 180 tablet, Rfl: 3   nystatin cream (MYCOSTATIN), Apply 1 application topically 2 (two) times daily. Apply to affected area BID for up to 7 days., Disp: 30 g, Rfl: 1   ondansetron (ZOFRAN-ODT) 4 MG disintegrating tablet, Take 1 tablet (4 mg total) by mouth every 8 (eight) hours as needed for nausea or vomiting., Disp: 20 tablet, Rfl: 11   pilocarpine (SALAGEN) 5 MG tablet, TAKE ONE TABLET BY MOUTH TWICE A DAY, Disp: 60 tablet, Rfl: 0   SUMAtriptan (IMITREX) 100 MG tablet, Take 1 tablet (100 mg total) by mouth once as needed for migraine. May repeat in 2 hours if headache persists or recurs., Disp: 9 tablet, Rfl: 5   verapamil (CALAN-SR) 240 MG CR tablet, Take 1 tablet (240 mg total) by mouth at bedtime., Disp: 90 tablet, Rfl: 2   vortioxetine HBr (TRINTELLIX) 20 MG TABS tablet, Take 1 tablet (20 mg total) by mouth daily., Disp: 30 tablet, Rfl: 1  PAST MEDICAL HISTORY: Past Medical History:  Diagnosis Date   Anxiety    Blind right eye 2009   swelling & pressure   Cluster headaches    started after seizure and migraines   Complication of anesthesia    trouble with short term memory after surgeries   COVID-19    Depression    Fibroids    s/p TLH, bilateral salpingectomy   GERD (gastroesophageal reflux disease)    patient thinks due to medications   History of anemia    prior to hysterectomy   History of pneumonia    History of trichomoniasis    Seizures (HCC) x 1 2-3 yrs ago none since   once saw neurologist, full body brain MRI and another 6 months lster   Status post right oophorectomy     Thrombocytopenia (HCC)    sees [place] with Heme Onc   Vasculitis (HCC)    neuro    PAST SURGICAL HISTORY: Past Surgical History:  Procedure Laterality Date  CYSTOSCOPY N/A 07/31/2017   Procedure: CYSTOSCOPY;  Surgeon: Jerene Bears, MD;  Location: [facility];  Service: Gynecology;  Laterality: N/A;   ENDOMETRIAL ABLATION     EYE SURGERY     laser - removed blood from right eye   HYSTEROSCOPY WITH D & C     LAPAROSCOPY N/A 09/19/2017   Procedure: LAPAROSCOPY DIAGNOSTIC WITH RIGHT OOPHERECTOMY, LYSIS OF ADHESIONS;  Surgeon: Romualdo Bolk, MD;  Location: [facility];  Service: Gynecology;  Laterality: N/A;   OMENTECTOMY N/A 11/21/2017   Procedure: OMENTECTOMY AND PELVIC WASHINGS;  Surgeon: Shonna Chock, MD;  Location: [facility];  Service: Gynecology;  Laterality: N/A;   ROBOTIC ASSISTED SALPINGO OOPHERECTOMY Left 11/21/2017   Procedure: XI ROBOTIC ASSISTED LEFT SALPINGO OOPHORECTOMY;  Surgeon: Shonna Chock, MD;  Location: [facility];  Service: Gynecology;  Laterality: Left;   TOTAL LAPAROSCOPIC HYSTERECTOMY WITH SALPINGECTOMY Bilateral 07/31/2017   Procedure: TOTAL LAPAROSCOPIC HYSTERECTOMY WITH SALPINGECTOMY;  Surgeon: Jerene Bears, MD;  Location: [facility];  Service: Gynecology;  Laterality: Bilateral;  20 week size uterus/ Alexis bag in room/ need 4.5 hours   TUBAL LIGATION     WISDOM TOOTH EXTRACTION      FAMILY HISTORY: Family History  Problem Relation Age of Onset   Sleep apnea Mother    Alcoholism Father    Cirrhosis Father    Alcohol abuse Father    Cancer Maternal Grandmother        unsure of type; died of other causes   Heart disease Maternal Aunt    Drug abuse Cousin     SOCIAL HISTORY:  Social History   Socioeconomic History   Marital status: Single    Spouse name: Not on file   Number of children: 3   Years of education: Not on file   Highest education level: Associate degree: occupational, Scientist, product/process development, or vocational program  Occupational History   Not on file   Tobacco Use   Smoking status: Former    Current packs/day: 0.00    Types: Cigarettes, E-cigarettes    Start date: 05/05/1998    Quit date: 05/05/2018    Years since quitting: 5.0   Smokeless tobacco: Never  Vaping Use   Vaping status: Former  Substance and Sexual Activity   Alcohol use: Not Currently   Drug use: No   Sexual activity: Yes    Birth control/protection: Surgical    Comment: hysterectomy  Other Topics Concern   Not on file  Social History Narrative   Not on file   Social Drivers of Health   Financial Resource Strain: Medium Risk (04/12/2023)   Overall Financial Resource Strain (CARDIA)    Difficulty of Paying Living Expenses: Somewhat hard  Food Insecurity: Food Insecurity Present (04/12/2023)   Hunger Vital Sign    Worried About Running Out of Food in the Last Year: Often true    Ran Out of Food in the Last Year: Sometimes true  Transportation Needs: No Transportation Needs (04/12/2023)   PRAPARE - Administrator, Civil Service (Medical): No    Lack of Transportation (Non-Medical): No  Physical Activity: Inactive (04/12/2023)   Exercise Vital Sign    Days of Exercise per Week: 0 days    Minutes of Exercise per Session: 0 min  Stress: Stress Concern Present (04/12/2023)   [employer] of Occupational Health - Occupational Stress Questionnaire    Feeling of Stress : Very much  Social Connections: Socially Isolated (04/12/2023)   Social Connection and Isolation  Panel [NHANES]    Frequency of Communication with Friends and Family: Never    Frequency of Social Gatherings with Friends and Family: Never    Attends Religious Services: Never    Database administrator or Organizations: No    Attends Banker Meetings: Never    Marital Status: Never married  Intimate Partner Violence: Not At Risk (04/12/2023)   Humiliation, Afraid, Rape, and Kick questionnaire    Fear of Current or Ex-Partner: No    Emotionally Abused: No    Physically Abused: No     Sexually Abused: No     PHYSICAL EXAM  Vitals:   05/08/23 0926  BP: 112/76  Pulse: (!) 57  Weight: 177 lb 8 oz (80.5 kg)  Height: 5' (1.524 m)    Body mass index is 34.67 kg/m.   General: The patient is well-developed and well-nourished and in no acute distress.  Eyes:   She has mild conjunctival erythema on the right..    Neck: She has tenderness over the right occiput/splenius capitis muscle range of motion is fairly normal in the neck.  Neurologic Exam  Mental status: The patient is alert and oriented x 3 at the time of the examination. The patient has apparent normal recent and remote memory, with an apparently normal attention span and concentration ability.   Speech is normal.  Cranial nerves: Extraocular movements are full.  Very mild right ptosis and facial strength is normal elsewhere.  There is normal facial sensation to soft touch.   Trapezius and sternocleidomastoid strength is normal. No dysarthria is noted.  Hearing is symmetric.  Motor:  Muscle bulk is normal.   Tone is normal. Strength is  5 / 5 in all 4 extremities.   Sensory: She has normal sensation to touch and vibration in the arms and legs   Gait and station: Station is normal.  Her gait is normal though the tandem gait is slightly wide. DTRs:  Normal and symmetric    DIAGNOSTIC DATA (LABS, IMAGING, TESTING) - I reviewed patient records, labs, notes, testing and imaging myself where available.  Lab Results  Component Value Date   WBC 5.4 09/27/2022   HGB 13.6 09/27/2022   HCT 40.6 09/27/2022   MCV 89 09/27/2022   PLT 99 (LL) 09/27/2022      Component Value Date/Time   NA 142 04/12/2023 0931   NA 138 08/31/2016 0957   K 4.5 04/12/2023 0931   K 3.9 08/31/2016 0957   CL 104 04/12/2023 0931   CO2 22 04/12/2023 0931   CO2 22 08/31/2016 0957   GLUCOSE 112 (H) 04/12/2023 0931   GLUCOSE 148 (H) 07/10/2018 0915   GLUCOSE 106 08/31/2016 0957   BUN 9 04/12/2023 0931   BUN 8.6 08/31/2016 0957    CREATININE 0.66 04/12/2023 0931   CREATININE 0.8 08/31/2016 0957   CALCIUM 10.2 04/12/2023 0931   CALCIUM 10.3 08/31/2016 0957   PROT 7.3 09/19/2021 1209   PROT 7.6 08/31/2016 0957   ALBUMIN 4.8 09/19/2021 1209   ALBUMIN 3.9 08/31/2016 0957   AST 17 09/19/2021 1209   AST 18 08/31/2016 0957   ALT 27 09/19/2021 1209   ALT 23 08/31/2016 0957   ALKPHOS 81 09/19/2021 1209   ALKPHOS 62 08/31/2016 0957   BILITOT 0.3 09/19/2021 1209   BILITOT 0.43 08/31/2016 0957   GFRNONAA 111 02/04/2019 0902   GFRAA 128 02/04/2019 0902         PROCEDURE Sphenopalatine ganglion block The patient was  placed in the supine position.  A 20g catheter was lubricated with gel, and placed in the right naris. The catheter was inserted above the middle turbinate towards the posterior nasal cavity. .  2 mL of 2% lidocaine was placed. The patient was asked to swallow during the injection. The patient demonstrated erythema of the sclera of the eye on this side, and tearing.   The patient tolerated the procedure well. No complications of the procedure were noted. The patient was kept in the supine position for 2-3 minutes following the procedure.  She reported that pain was much better afterwards.     ASSESSMENT AND PLAN  CNS vasculitis (HCC) - Plan: IgG, IgA, IgM, CBC with Differential/Platelet  High risk medication use - Plan: IgG, IgA, IgM, CBC with Differential/Platelet  Vascular headache  Seizures (HCC)  Nocturnal hypoxemia  Anxiety  Other headache syndrome  Neck pain  Chronic tension-type headache, intractable   1.   Continue Rituxan for CNS vaculitis   check labs 2.   Seizures are well-controlled and she will continue Vimpat.   Consider change to nefazodone if Trintellix not covered (SSRI not helpful and nefazodone has most similar MOA).   Another alternative would be trazodone as sleep is a major issue. 3.    She has nocturnal hypoxemia.   Continue 2L O2 at night.  4.   Right  sphenopalatine ganglion block with 2 cc of 2% lidocaine.  She reported pain was much better afterwards. 5.   Right splenius capitus trigger point injection with 80 mg Depo-Medrol (Lot [id number]  11/2024)  in 3 cc Marcaine using sterile technique.  She tolerated the procedure well and pain was better afterwards. 6.   Trial of Nurtec for migraine.    Lot [id number]   Exp 08/2026.    Could also take Tylenol/Codeine when pain is more severe.   return in 6 months.  She will call if she has new or worsening symptoms.    Itzae Miralles A. Epimenio Foot, MD, PhD 05/08/2023, 10:05 AM Certified in Neurology, Clinical Neurophysiology, Sleep Medicine, Pain Medicine and Neuroimaging   [facility] [address], [address] [place], [place] [address] [phone]

## 2023-05-08 NOTE — Telephone Encounter (Signed)
 Resubmitted fax for PA of Trintellix after answering more questions. Waiting for decision.

## 2023-05-08 NOTE — Patient Instructions (Signed)
 Consider Nefazodone is Trintellix is not covered by insurance

## 2023-05-10 ENCOUNTER — Encounter: Payer: Self-pay | Admitting: Neurology

## 2023-05-10 LAB — CBC WITH DIFFERENTIAL/PLATELET
Basophils Absolute: 0 10*3/uL (ref 0.0–0.2)
Basos: 1 %
EOS (ABSOLUTE): 0.1 10*3/uL (ref 0.0–0.4)
Eos: 2 %
Hematocrit: 42.6 % (ref 34.0–46.6)
Hemoglobin: 13.3 g/dL (ref 11.1–15.9)
Immature Grans (Abs): 0 10*3/uL (ref 0.0–0.1)
Immature Granulocytes: 1 %
Lymphocytes Absolute: 1.5 10*3/uL (ref 0.7–3.1)
Lymphs: 27 %
MCH: 28.6 pg (ref 26.6–33.0)
MCHC: 31.2 g/dL — ABNORMAL LOW (ref 31.5–35.7)
MCV: 92 fL (ref 79–97)
Monocytes Absolute: 0.3 10*3/uL (ref 0.1–0.9)
Monocytes: 6 %
Neutrophils Absolute: 3.3 10*3/uL (ref 1.4–7.0)
Neutrophils: 63 %
Platelets: 118 10*3/uL — ABNORMAL LOW (ref 150–450)
RBC: 4.65 x10E6/uL (ref 3.77–5.28)
RDW: 12.7 % (ref 11.7–15.4)
WBC: 5.3 10*3/uL (ref 3.4–10.8)

## 2023-05-10 LAB — IGG, IGA, IGM
IgA/Immunoglobulin A, Serum: 342 mg/dL (ref 87–352)
IgG (Immunoglobin G), Serum: 1012 mg/dL (ref 586–1602)
IgM (Immunoglobulin M), Srm: 19 mg/dL — ABNORMAL LOW (ref 26–217)

## 2023-05-13 ENCOUNTER — Other Ambulatory Visit: Payer: Self-pay | Admitting: Neurology

## 2023-05-13 DIAGNOSIS — F419 Anxiety disorder, unspecified: Secondary | ICD-10-CM

## 2023-05-14 ENCOUNTER — Other Ambulatory Visit: Payer: Self-pay

## 2023-05-15 ENCOUNTER — Other Ambulatory Visit: Payer: Self-pay

## 2023-05-15 ENCOUNTER — Other Ambulatory Visit (HOSPITAL_COMMUNITY): Payer: Self-pay

## 2023-05-15 DIAGNOSIS — F4323 Adjustment disorder with mixed anxiety and depressed mood: Secondary | ICD-10-CM | POA: Diagnosis not present

## 2023-05-15 NOTE — Telephone Encounter (Signed)
 Last seen 05/08/23, next appt 11/20/23, refused refill due to dispensed on 05/01/23 not eligible for fill until 05/29/23 Dispenses   Dispensed Days Supply Quantity Provider Pharmacy  CLONAZEPAM 1 MG TABLET 05/01/2023 30 60 each Sater, Pearletha Furl, MD [facility] [phone] - G...  CLONAZEPAM 1 MG TAB! 01/30/2023  180 each  Unspecified  CLONAZEPAM 1 MG TAB! 10/29/2022  180 each  Unspecified  CLONAZEPAM 1 MG TAB! 08/02/2022  10 each  Unspecified  CLONAZEPAM 1 MG TAB! 08/01/2022  170 each  Unspecified

## 2023-05-16 ENCOUNTER — Other Ambulatory Visit: Payer: Self-pay

## 2023-05-17 ENCOUNTER — Other Ambulatory Visit: Payer: Self-pay | Admitting: Neurology

## 2023-05-17 MED ORDER — PILOCARPINE HCL 5 MG PO TABS
5.0000 mg | ORAL_TABLET | Freq: Two times a day (BID) | ORAL | 11 refills | Status: AC
  Filled 2023-05-17 – 2023-05-18 (×2): qty 60, 30d supply, fill #0

## 2023-05-17 NOTE — Telephone Encounter (Signed)
 Spoke w/ MD, he approved refilling. E-scribed

## 2023-05-17 NOTE — Telephone Encounter (Signed)
 Spoke w/ Dr. Epimenio Foot. He approved refilling x1 yr. E-scribed

## 2023-05-18 ENCOUNTER — Other Ambulatory Visit: Payer: Self-pay

## 2023-05-23 NOTE — Telephone Encounter (Signed)
 Message acknowledged and reviewed.

## 2023-05-24 ENCOUNTER — Encounter: Payer: Self-pay | Admitting: Internal Medicine

## 2023-05-29 ENCOUNTER — Other Ambulatory Visit: Payer: Self-pay

## 2023-05-29 ENCOUNTER — Encounter (HOSPITAL_COMMUNITY): Payer: Self-pay | Admitting: Physician Assistant

## 2023-05-29 ENCOUNTER — Ambulatory Visit (INDEPENDENT_AMBULATORY_CARE_PROVIDER_SITE_OTHER): Payer: 59 | Admitting: Physician Assistant

## 2023-05-29 VITALS — BP 141/84 | HR 70 | Temp 97.9°F | Ht 60.0 in | Wt 185.6 lb

## 2023-05-29 DIAGNOSIS — F411 Generalized anxiety disorder: Secondary | ICD-10-CM

## 2023-05-29 DIAGNOSIS — F4310 Post-traumatic stress disorder, unspecified: Secondary | ICD-10-CM

## 2023-05-29 DIAGNOSIS — F332 Major depressive disorder, recurrent severe without psychotic features: Secondary | ICD-10-CM

## 2023-05-29 MED ORDER — VORTIOXETINE HBR 20 MG PO TABS
20.0000 mg | ORAL_TABLET | Freq: Every day | ORAL | 1 refills | Status: DC
Start: 2023-05-29 — End: 2023-11-06
  Filled 2023-05-29 – 2023-09-12 (×3): qty 30, 30d supply, fill #0

## 2023-05-29 MED ORDER — BUPROPION HCL ER (XL) 150 MG PO TB24: 150.0000 mg | ORAL_TABLET | Freq: Every day | ORAL | 1 refills | Status: DC

## 2023-05-29 NOTE — Progress Notes (Signed)
 [facility] MD/PA/NP OP Progress Note  05/29/2023 9:37 PM Angel Rodriguez  MRN:  [id number]  Chief Complaint:  Chief Complaint  Patient presents with   Follow-up   Medication Management   HPI:   Angel Rodriguez "Angel Rodriguez" is a 48 year old female with a past psychiatric history significant for major depressive disorder (severe episode, without psychotic features), PTSD, and generalized anxiety disorder who presents to [facility] for follow-up and medication management.  Patient is currently being managed on the following psychiatric medications:  Strattera 40 mg daily Trintellix 20 mg daily  Since being on her medications, patient reports that she is slowly going back to her normal routine.  Patient reports that she is still having trouble getting access to her Trintellix.  In regards to her use of Strattera, patient reports that she experienced insomnia while taking the medication.  Since the last encounter, patient reports that she has discontinued her use of Strattera.  Patient is interested in other medication options for the management of her depression.  Patient considered being placed on nefazodone for the management of her depression.  Patient rates her depression as 5 out of 10 with 10 being best severe.  Patient endorses depressive episodes every day with symptoms lasting most of the day.  Patient endorses the following depressive symptoms: feelings of sadness, lack of motivation, decreased concentration, decreased energy, irritability, feelings of guilt/worthlessness, and helplessness.  She reports that her depression is worsened by running out of her medication.  She reports that her depression is alleviated by taking her medications regularly.  In addition to depression, patient endorses anxiety and rates her anxiety as 7 out of 10.  She reports that her anxiety stems from worrying about her medications.  Patient reports that she was sitting down and not  working, her anxiety would be more elevated.  A PHQ-9 screen was performed with the patient scoring a 21.  A GAD-7 screen was also performed with the patient scoring a 16.  Patient is alert and oriented x 4, calm, cooperative, and fully engaged in conversation during the encounter.  Patient endorses irritable mood, anxiety, and feeling annoyed.  Patient exhibits depressed mood with congruent affect.  Patient denies suicidal or homicidal ideations.  She further denies auditory or visual hallucinations and does not appear to be responding to internal/external stimuli.  Patient endorses fair sleep and receives on average 4 to 6 hours of sleep per night.  Patient endorses good appetite and eats on average 2 meals per day.  Patient denies alcohol consumption, tobacco use, or illicit drug use.  Visit Diagnosis:    ICD-10-CM   1. Severe episode of recurrent major depressive disorder, without psychotic features (HCC)  F33.2 buPROPion (WELLBUTRIN XL) 150 MG 24 hr tablet    vortioxetine HBr (TRINTELLIX) 20 MG TABS tablet    2. GAD (generalized anxiety disorder)  F41.1 vortioxetine HBr (TRINTELLIX) 20 MG TABS tablet    3. PTSD (post-traumatic stress disorder)  F43.10       Past Psychiatric History:  Patient endorses a past psychiatric history significant for depression and anxiety   Patient denies a past history of hospitalization due to mental health but states that she has completed intensive outpatient [facility] from 11/28/2020 to 12/13/2020.   Patient was previously seen by Dr. Hinton Dyer from 06/06/2018 to 04/02/2020   Patient denies a past history of suicide attempt but states that she has had thoughts in the past.  Patient reports that she last had suicidal  thoughts today.   Patient denies a past history of homicide attempt  Past Medical History:  Past Medical History:  Diagnosis Date   Anxiety    Blind right eye 2009   swelling & pressure   Cluster headaches    started after seizure and  migraines   Complication of anesthesia    trouble with short term memory after surgeries   COVID-19    Depression    Fibroids    s/p TLH, bilateral salpingectomy   GERD (gastroesophageal reflux disease)    patient thinks due to medications   History of anemia    prior to hysterectomy   History of pneumonia    History of trichomoniasis    Seizures (HCC) x 1 2-3 yrs ago none since   once saw neurologist, full body brain MRI and another 6 months lster   Status post right oophorectomy    Thrombocytopenia (HCC)    sees [place] with Heme Onc   Vasculitis (HCC)    neuro    Past Surgical History:  Procedure Laterality Date   CYSTOSCOPY N/A 07/31/2017   Procedure: CYSTOSCOPY;  Surgeon: Jerene Bears, MD;  Location: [facility];  Service: Gynecology;  Laterality: N/A;   ENDOMETRIAL ABLATION     EYE SURGERY     laser - removed blood from right eye   HYSTEROSCOPY WITH D & C     LAPAROSCOPY N/A 09/19/2017   Procedure: LAPAROSCOPY DIAGNOSTIC WITH RIGHT OOPHERECTOMY, LYSIS OF ADHESIONS;  Surgeon: Romualdo Bolk, MD;  Location: [facility];  Service: Gynecology;  Laterality: N/A;   OMENTECTOMY N/A 11/21/2017   Procedure: OMENTECTOMY AND PELVIC WASHINGS;  Surgeon: Shonna Chock, MD;  Location: [facility];  Service: Gynecology;  Laterality: N/A;   ROBOTIC ASSISTED SALPINGO OOPHERECTOMY Left 11/21/2017   Procedure: XI ROBOTIC ASSISTED LEFT SALPINGO OOPHORECTOMY;  Surgeon: Shonna Chock, MD;  Location: [facility];  Service: Gynecology;  Laterality: Left;   TOTAL LAPAROSCOPIC HYSTERECTOMY WITH SALPINGECTOMY Bilateral 07/31/2017   Procedure: TOTAL LAPAROSCOPIC HYSTERECTOMY WITH SALPINGECTOMY;  Surgeon: Jerene Bears, MD;  Location: [facility];  Service: Gynecology;  Laterality: Bilateral;  20 week size uterus/ Alexis bag in room/ need 4.5 hours   TUBAL LIGATION     WISDOM TOOTH EXTRACTION      Family Psychiatric History:  Son - bipolar disorder   Family history of suicide attempt: Patient denies Family  history of homicide attempt: Patient denies Family history of substance abuse: Patient reports that her son used to use Roxie and heroin.  She reports that he has been clean for 3 years now.  Family History:  Family History  Problem Relation Age of Onset   Sleep apnea Mother    Alcoholism Father    Cirrhosis Father    Alcohol abuse Father    Cancer Maternal Grandmother        unsure of type; died of other causes   Heart disease Maternal Aunt    Drug abuse Cousin     Social History:  Social History   Socioeconomic History   Marital status: Single    Spouse name: Not on file   Number of children: 3   Years of education: Not on file   Highest education level: Associate degree: occupational, Scientist, product/process development, or vocational program  Occupational History   Not on file  Tobacco Use   Smoking status: Former    Current packs/day: 0.00    Types: Cigarettes, E-cigarettes    Start date: 05/05/1998    Quit  date: 05/05/2018    Years since quitting: 5.0   Smokeless tobacco: Never  Vaping Use   Vaping status: Former  Substance and Sexual Activity   Alcohol use: Not Currently   Drug use: No   Sexual activity: Yes    Birth control/protection: Surgical    Comment: hysterectomy  Other Topics Concern   Not on file  Social History Narrative   Not on file   Social Drivers of Health   Financial Resource Strain: Medium Risk (04/12/2023)   Overall Financial Resource Strain (CARDIA)    Difficulty of Paying Living Expenses: Somewhat hard  Food Insecurity: Food Insecurity Present (04/12/2023)   Hunger Vital Sign    Worried About Running Out of Food in the Last Year: Often true    Ran Out of Food in the Last Year: Sometimes true  Transportation Needs: No Transportation Needs (04/12/2023)   PRAPARE - Administrator, Civil Service (Medical): No    Lack of Transportation (Non-Medical): No  Physical Activity: Inactive (04/12/2023)   Exercise Vital Sign    Days of Exercise per Week: 0 days     Minutes of Exercise per Session: 0 min  Stress: Stress Concern Present (04/12/2023)   [employer] of Occupational Health - Occupational Stress Questionnaire    Feeling of Stress : Very much  Social Connections: Socially Isolated (04/12/2023)   Social Connection and Isolation Panel [NHANES]    Frequency of Communication with Friends and Family: Never    Frequency of Social Gatherings with Friends and Family: Never    Attends Religious Services: Never    Database administrator or Organizations: No    Attends Banker Meetings: Never    Marital Status: Never married    Allergies:  Allergies  Allergen Reactions   Ivp Dye [Iodinated Contrast Media] Hives, Itching and Swelling   Bee Venom Swelling   Buspar [Buspirone] Other (See Comments)    Metabolic Disorder Labs: Lab Results  Component Value Date   HGBA1C 6.2 (H) 04/12/2023   MPG 108.28 03/14/2018   No results found for: "PROLACTIN" Lab Results  Component Value Date   CHOL 184 04/12/2023   TRIG 296 (H) 04/12/2023   HDL 44 04/12/2023   CHOLHDL 4.2 04/12/2023   VLDL 34 10/13/2021   LDLCALC 91 04/12/2023   LDLCALC 82 04/12/2023   Lab Results  Component Value Date   TSH 1.030 02/17/2021   TSH 1.730 07/09/2020    Therapeutic Level Labs: No results found for: "LITHIUM" No results found for: "VALPROATE" No results found for: "CBMZ"  Current Medications: Current Outpatient Medications  Medication Sig Dispense Refill   buPROPion (WELLBUTRIN XL) 150 MG 24 hr tablet Take 1 tablet (150 mg total) by mouth daily. 30 tablet 1   Accu-Chek Softclix Lancets lancets Use as instructed 100 each 12   acetaminophen-codeine (TYLENOL #3) 300-30 MG tablet TAKE ONE TABLET BY MOUTH UP TO FOUR TIMES A DAY AS NEEDED 28 tablet 2   Ascorbic Acid (VITAMIN C) 1000 MG tablet Take 1,000 mg by mouth daily.     atomoxetine (STRATTERA) 40 MG capsule Take 1 capsule (40 mg total) by mouth daily. 30 capsule 1   atorvastatin (LIPITOR) 80 MG  tablet Take 1 tablet (80 mg total) by mouth daily. 90 tablet 3   atropine 1 % ophthalmic solution One drop into affected eye up to tid 5 mL 3   b complex vitamins capsule Take 1 capsule by mouth daily.  Blood Glucose Monitoring Suppl (ACCU-CHEK GUIDE ME) w/Device KIT USE TO CHECK FASTING BLOOD SUGAR AND 2 HOURS AFTER LARGEST MEAL 1 kit 0   calcium carbonate (TUMS EX) 750 MG chewable tablet Chew 2 tablets by mouth daily as needed for heartburn.      clonazePAM (KLONOPIN) 1 MG tablet Take 1 tablet (1 mg total) by mouth 2 (two) times daily. 60 tablet 0   dorzolamide-timolol (COSOPT) 22.3-6.8 MG/ML ophthalmic solution Place 1 drop into the right eye 2 (two) times daily.      ezetimibe (ZETIA) 10 MG tablet Take 1 tablet (10 mg total) by mouth daily. 90 tablet 3   fluticasone (FLONASE) 50 MCG/ACT nasal spray USE 1 -2 SPRAYS INTO BOTH NOSTRILS DAILY AS NEEDED FOR ALLERGIES 16 mL 0   glucose blood (ACCU-CHEK GUIDE) test strip Use to check fasting glucose and 2 hours after largest meal 100 each 12   Lacosamide 100 MG TABS Take 1 tablet (100 mg total) by mouth 2 (two) times daily. 180 tablet 1   metFORMIN (GLUCOPHAGE) 500 MG tablet Take 2 tablets (1000mg )  by mouth in the morning and 1 tablet (500mg ) by mouth in the evening. 180 tablet 3   nystatin cream (MYCOSTATIN) Apply 1 application topically 2 (two) times daily. Apply to affected area BID for up to 7 days. 30 g 1   ondansetron (ZOFRAN-ODT) 4 MG disintegrating tablet Take 1 tablet (4 mg total) by mouth every 8 (eight) hours as needed for nausea or vomiting. 20 tablet 11   pilocarpine (SALAGEN) 5 MG tablet Take 1 tablet (5 mg total) by mouth 2 (two) times daily. 60 tablet 11   SUMAtriptan (IMITREX) 100 MG tablet Take 1 tablet (100 mg total) by mouth once as needed for migraine. May repeat in 2 hours if headache persists or recurs. 9 tablet 5   verapamil (CALAN-SR) 240 MG CR tablet Take 1 tablet (240 mg total) by mouth at bedtime. 90 tablet 2    vortioxetine HBr (TRINTELLIX) 20 MG TABS tablet Take 1 tablet (20 mg total) by mouth daily. 30 tablet 1   Current Facility-Administered Medications  Medication Dose Route Frequency Provider Last Rate Last Admin   bupivacaine(PF) (MARCAINE) 0.5 % injection 2 mL  2 mL Other Once Sater, Richard A, MD       lidocaine (XYLOCAINE) 2 % (with pres) injection 40 mg  2 mL Intradermal Once Sater, Pearletha Furl, MD         Musculoskeletal: Strength & Muscle Tone: within normal limits Gait & Station: normal Patient leans: N/A  Psychiatric Specialty Exam: Review of Systems  Psychiatric/Behavioral:  Positive for dysphoric mood and sleep disturbance. Negative for decreased concentration, hallucinations, self-injury and suicidal ideas. The patient is nervous/anxious. The patient is not hyperactive.     Blood pressure (!) 141/84, pulse 70, temperature 97.9 F (36.6 C), temperature source Oral, height 5' (1.524 m), weight 185 lb 9.6 oz (84.2 kg), last menstrual period 05/02/2017, SpO2 95%.Body mass index is 36.25 kg/m.  General Appearance: Casual  Eye Contact:  Good  Speech:  Clear and Coherent and Normal Rate  Volume:  Normal  Mood:  Anxious and Depressed  Affect:  Congruent  Thought Process:  Coherent, Goal Directed, and Descriptions of Associations: Intact  Orientation:  Full (Time, Place, and Person)  Thought Content: WDL   Suicidal Thoughts:  No  Homicidal Thoughts:  No  Memory:  Immediate;   Good Recent;   Good Remote;   Good  Judgement:  Good  Insight:  Good  Psychomotor Activity:  Normal  Concentration:  Concentration: Good and Attention Span: Good  Recall:  Good  Fund of Knowledge: Good  Language: Good  Akathisia:  No  Handed:  Right  AIMS (if indicated): not done  Assets:  Communication Skills Desire for Improvement Housing Social Support Transportation Vocational/Educational  ADL's:  Intact  Cognition: WNL  Sleep:  Fair   Screenings: GAD-7    Flowsheet Row Clinical  Support from 05/29/2023 in [facility] Office Visit from 04/17/2023 in [facility] Counselor from 04/12/2023 in [facility] Office Visit from 04/24/2022 in [facility] Office Visit from 10/21/2020 in [facility] at [facility]  Total GAD-7 Score 16 19 18 8 19       PHQ2-9    Flowsheet Row Clinical Support from 05/29/2023 in [facility] Most recent reading at 05/29/2023  2:34 PM Office Visit from 04/17/2023 in [facility] Most recent reading at 04/17/2023  2:02 PM Counselor from 04/12/2023 in [facility] Most recent reading at 04/12/2023  1:11 PM Office Visit from 04/12/2023 in [facility] at [facility] Most recent reading at 04/12/2023  8:58 AM Office Visit from 02/20/2023 in [facility] for [facility] Most recent reading at 02/20/2023  3:36 PM  PHQ-2 Total Score 5 6 3 3 1   PHQ-9 Total Score 21 26 23 21  --      Flowsheet Row Clinical Support from 05/29/2023 in [facility] Office Visit from 04/17/2023 in [facility] Counselor from 04/12/2023 in [facility]  C-SSRS RISK CATEGORY No Risk Low Risk Moderate Risk        Assessment and Plan:   Tavonna Worthington. Gobert "Angel Rodriguez" is a 48 year old female with a past psychiatric history significant for major depressive disorder (severe episode, without psychotic features), PTSD, and generalized anxiety disorder who presents to [facility] for follow-up and medication management.  Patient presents today encounter stating that her use of Strattera because her she has insomnia.  She reports that she has since discontinued the medication.  Since being placed on Trintellix,  patient reports that her mood has improved and that she has been able to get into her routine more.  She still continues to endorse depression and elevated anxiety and is interested in being placed on another medication to help further manage her depression and anxiety.  Provider recommended patient be placed on Wellbutrin XR 150 mg 24-hour tablet daily for the management of her depression.  Patient was agreeable to recommendation.  Patient's medications to be e-prescribed through pharmacy of choice.  Provider went over medication side effect profile prior to the conclusion of the encounter.  Collaboration of Care: Collaboration of Care: Medication Management AEB provider managing patient's psychiatric medications, Primary Care Provider AEB patient being seen by a primary care provider, Psychiatrist AEB patient being followed by mental health provider at this facility, and Other provider involved in patient's care AEB patient being seen by neurology  Patient/Guardian was advised Release of Information must be obtained prior to any record release in order to collaborate their care with an outside provider. Patient/Guardian was advised if they have not already done so to contact the registration department to sign all necessary forms in order for [place] to release information  regarding their care.   Consent: Patient/Guardian gives verbal consent for treatment and assignment of benefits for services provided during this visit. Patient/Guardian expressed understanding and agreed to proceed.   1. Severe episode of recurrent major depressive disorder, without psychotic features (HCC) (Primary)  - buPROPion (WELLBUTRIN XL) 150 MG 24 hr tablet; Take 1 tablet (150 mg total) by mouth daily.  Dispense: 30 tablet; Refill: 1 - vortioxetine HBr (TRINTELLIX) 20 MG TABS tablet; Take 1 tablet (20 mg total) by mouth daily.  Dispense: 30 tablet; Refill: 1  2. GAD (generalized anxiety disorder)  - vortioxetine HBr  (TRINTELLIX) 20 MG TABS tablet; Take 1 tablet (20 mg total) by mouth daily.  Dispense: 30 tablet; Refill: 1  3. PTSD (post-traumatic stress disorder)  Patient to follow up in 6 weeks Provider spent a total of 45 minutes with the patient/reviewing patient's chart  Meta Hatchet, PA 05/29/2023, 9:37 PM

## 2023-05-30 ENCOUNTER — Other Ambulatory Visit: Payer: Self-pay | Admitting: Neurology

## 2023-05-30 DIAGNOSIS — F419 Anxiety disorder, unspecified: Secondary | ICD-10-CM

## 2023-05-31 ENCOUNTER — Ambulatory Visit (HOSPITAL_COMMUNITY): Payer: 59 | Admitting: Licensed Clinical Social Worker

## 2023-05-31 DIAGNOSIS — F4323 Adjustment disorder with mixed anxiety and depressed mood: Secondary | ICD-10-CM | POA: Diagnosis not present

## 2023-06-05 ENCOUNTER — Inpatient Hospital Stay (HOSPITAL_BASED_OUTPATIENT_CLINIC_OR_DEPARTMENT_OTHER): Admission: RE | Admit: 2023-06-05 | Payer: 59 | Source: Ambulatory Visit | Admitting: Radiology

## 2023-06-13 ENCOUNTER — Other Ambulatory Visit: Payer: Self-pay | Admitting: Neurology

## 2023-06-15 ENCOUNTER — Other Ambulatory Visit (HOSPITAL_COMMUNITY): Payer: Self-pay

## 2023-06-15 ENCOUNTER — Other Ambulatory Visit: Payer: Self-pay

## 2023-06-16 ENCOUNTER — Other Ambulatory Visit (HOSPITAL_BASED_OUTPATIENT_CLINIC_OR_DEPARTMENT_OTHER): Payer: Self-pay

## 2023-06-18 ENCOUNTER — Other Ambulatory Visit: Payer: Self-pay

## 2023-06-19 ENCOUNTER — Other Ambulatory Visit: Payer: Self-pay

## 2023-06-21 ENCOUNTER — Other Ambulatory Visit: Payer: Self-pay

## 2023-06-22 ENCOUNTER — Other Ambulatory Visit (HOSPITAL_COMMUNITY): Payer: Self-pay | Admitting: Physician Assistant

## 2023-06-22 DIAGNOSIS — F332 Major depressive disorder, recurrent severe without psychotic features: Secondary | ICD-10-CM

## 2023-06-26 ENCOUNTER — Other Ambulatory Visit: Payer: Self-pay

## 2023-07-04 ENCOUNTER — Other Ambulatory Visit: Payer: Self-pay

## 2023-07-04 MED ORDER — ATORVASTATIN CALCIUM 80 MG PO TABS: 80.0000 mg | ORAL_TABLET | Freq: Every day | ORAL | 0 refills | Status: DC

## 2023-07-06 ENCOUNTER — Encounter: Payer: Self-pay | Admitting: Nurse Practitioner

## 2023-07-06 ENCOUNTER — Ambulatory Visit: Attending: Nurse Practitioner | Admitting: Nurse Practitioner

## 2023-07-06 VITALS — BP 118/78 | HR 69 | Ht 60.0 in | Wt 180.0 lb

## 2023-07-06 DIAGNOSIS — I776 Arteritis, unspecified: Secondary | ICD-10-CM | POA: Diagnosis not present

## 2023-07-06 DIAGNOSIS — Z79899 Other long term (current) drug therapy: Secondary | ICD-10-CM

## 2023-07-06 DIAGNOSIS — I5189 Other ill-defined heart diseases: Secondary | ICD-10-CM

## 2023-07-06 DIAGNOSIS — E782 Mixed hyperlipidemia: Secondary | ICD-10-CM

## 2023-07-06 DIAGNOSIS — E119 Type 2 diabetes mellitus without complications: Secondary | ICD-10-CM | POA: Diagnosis not present

## 2023-07-06 NOTE — Progress Notes (Signed)
 Office Visit    Patient Name: Angel Rodriguez Date of Encounter: 07/06/2023  Primary Care Provider:  Nonda Bays, FNP Primary Cardiologist:  Angel Delton, MD  Chief Complaint    48 y.o. female with a history of diabetes, hyperlipidemia, diastolic dysfunction, obesity, remote tobacco abuse, CNS vasculitis, and headaches, who presents for follow-up related to hyperlipidemia.  Past Medical History  Subjective   Past Medical History:  Diagnosis Date   Anxiety    Blind right eye 2009   swelling & pressure   Cluster headaches    started after seizure and migraines   Complication of anesthesia    trouble with short term memory after surgeries   COVID-19    Depression    Diastolic dysfunction    a. 01/2021 Echo: EF 60-65%, no rwma, GrII DD, nl RV fxn, mildly dil LA. Triv AI.   Fibroids    s/p TLH, bilateral salpingectomy   GERD (gastroesophageal reflux disease)    patient thinks due to medications   History of anemia    prior to hysterectomy   History of pneumonia    History of trichomoniasis    Seizures (HCC) x 1 2-3 yrs ago none since   once saw neurologist, full body brain MRI and another 6 months lster   Status post right oophorectomy    Thrombocytopenia (HCC)    sees [place] with Heme Onc   Vasculitis (HCC)    neuro   Past Surgical History:  Procedure Laterality Date   CYSTOSCOPY N/A 07/31/2017   Procedure: CYSTOSCOPY;  Surgeon: Angel Rein, MD;  Location: [facility];  Service: Gynecology;  Laterality: N/A;   ENDOMETRIAL ABLATION     EYE SURGERY     laser - removed blood from right eye   HYSTEROSCOPY WITH D & C     LAPAROSCOPY N/A 09/19/2017   Procedure: LAPAROSCOPY DIAGNOSTIC WITH RIGHT OOPHERECTOMY, LYSIS OF ADHESIONS;  Surgeon: Angel Rodriguez, Angel Evelyn, MD;  Location: [facility];  Service: Gynecology;  Laterality: N/A;   OMENTECTOMY N/A 11/21/2017   Procedure: OMENTECTOMY AND PELVIC WASHINGS;  Surgeon: Angel Sanders, MD;  Location: [facility];  Service:  Gynecology;  Laterality: N/A;   ROBOTIC ASSISTED SALPINGO OOPHERECTOMY Left 11/21/2017   Procedure: XI ROBOTIC ASSISTED LEFT SALPINGO OOPHORECTOMY;  Surgeon: Angel Sanders, MD;  Location: [facility];  Service: Gynecology;  Laterality: Left;   TOTAL LAPAROSCOPIC HYSTERECTOMY WITH SALPINGECTOMY Bilateral 07/31/2017   Procedure: TOTAL LAPAROSCOPIC HYSTERECTOMY WITH SALPINGECTOMY;  Surgeon: Angel Rein, MD;  Location: [facility];  Service: Gynecology;  Laterality: Bilateral;  20 week size uterus/ Alexis bag in room/ need 4.5 hours   TUBAL LIGATION     WISDOM TOOTH EXTRACTION      Allergies  Allergies  Allergen Reactions   Ivp Dye [Iodinated Contrast Media] Hives, Itching and Swelling   Bee Venom Swelling   Buspar  [Buspirone ] Other (See Comments)      History of Present Illness      48 y.o. y/o female with a history of diabetes, hyperlipidemia, diastolic dysfunction, obesity, remote tobacco abuse, CNS vasculitis, and headaches.  Her mother has a history of coronary disease diagnosed in her 51s and requiring three-vessel bypass.  She previously underwent stress testing in October 2022, which was low risk without ischemia or infarct.  Echocardiogram in November 2022 showing EF 60 to 65% with grade 2 diastolic dysfunction.   Patient was diagnosed with CNS vasculitis in early 2024.   [name] was last seen in  cardiology clinic in May 2024, at which time she was doing well and her Lipitor was refilled.  LDL earlier this year was 91, up from 82 last year.  Triglycerides were down from 325 last year to 296 this year.  Zetia  10 mg daily was added to her regimen.  She has tolerated this well.  She notes that she has chronic fatigue in that setting, is very sedentary.  She does not experience chest pain or dyspnea.  She denies palpitations, PND, orthopnea, dizziness, syncope, or early satiety.  Sometimes she notes swelling in her ankles if she sits for long periods of time. Objective  Home Medications     Current Outpatient Medications  Medication Sig Dispense Refill   Accu-Chek Softclix Lancets lancets Use as instructed 100 each 12   acetaminophen -codeine  (TYLENOL  #3) 300-30 MG tablet TAKE ONE TABLET BY MOUTH UP TO FOUR TIMES A DAY AS NEEDED 28 tablet 2   Ascorbic Acid (VITAMIN C) 1000 MG tablet Take 1,000 mg by mouth daily.     atorvastatin  (LIPITOR) 80 MG tablet Take 1 tablet (80 mg total) by mouth daily. 30 tablet 0   atropine  1 % ophthalmic solution One drop into affected eye up to tid 5 mL 3   b complex vitamins capsule Take 1 capsule by mouth daily.     Blood Glucose Monitoring Suppl (ACCU-CHEK GUIDE ME) w/Device KIT USE TO CHECK FASTING BLOOD SUGAR AND 2 HOURS AFTER LARGEST MEAL 1 kit 0   buPROPion  (WELLBUTRIN  XL) 150 MG 24 hr tablet TAKE 1 TABLET BY MOUTH EVERY DAY 90 tablet 1   calcium  carbonate (TUMS EX) 750 MG chewable tablet Chew 2 tablets by mouth daily as needed for heartburn.      clonazePAM  (KLONOPIN ) 1 MG tablet TAKE 1 TABLET BY MOUTH TWICE A DAY 60 tablet 5   dorzolamide -timolol  (COSOPT ) 22.3-6.8 MG/ML ophthalmic solution Place 1 drop into the right eye 2 (two) times daily.      ezetimibe  (ZETIA ) 10 MG tablet Take 1 tablet (10 mg total) by mouth daily. 90 tablet 3   fluticasone  (FLONASE ) 50 MCG/ACT nasal spray USE 1 -2 SPRAYS INTO BOTH NOSTRILS DAILY AS NEEDED FOR ALLERGIES 48 mL 1   glucose blood (ACCU-CHEK GUIDE) test strip Use to check fasting glucose and 2 hours after largest meal 100 each 12   Lacosamide  100 MG TABS Take 1 tablet (100 mg total) by mouth 2 (two) times daily. 180 tablet 1   latanoprost  (XALATAN ) 0.005 % ophthalmic solution Place 1 drop into both eyes at bedtime.     metFORMIN  (GLUCOPHAGE ) 500 MG tablet Take 2 tablets (1000mg )  by mouth in the morning and 1 tablet (500mg ) by mouth in the evening. 180 tablet 3   moxifloxacin (VIGAMOX) 0.5 % ophthalmic solution Place into both eyes 3 (three) times daily.     nystatin  cream (MYCOSTATIN ) Apply 1 application  topically 2 (two) times daily. Apply to affected area BID for up to 7 days. 30 g 1   ondansetron  (ZOFRAN -ODT) 4 MG disintegrating tablet Take 1 tablet (4 mg total) by mouth every 8 (eight) hours as needed for nausea or vomiting. 20 tablet 11   pilocarpine  (SALAGEN ) 5 MG tablet Take 1 tablet (5 mg total) by mouth 2 (two) times daily. 60 tablet 11   SUMAtriptan  (IMITREX ) 100 MG tablet Take 1 tablet (100 mg total) by mouth once as needed for migraine. May repeat in 2 hours if headache persists or recurs. 9 tablet 5   verapamil  (  CALAN -SR) 240 MG CR tablet Take 1 tablet (240 mg total) by mouth at bedtime. 90 tablet 2   vortioxetine  HBr (TRINTELLIX ) 20 MG TABS tablet Take 1 tablet (20 mg total) by mouth daily. 30 tablet 1   Current Facility-Administered Medications  Medication Dose Route Frequency Provider Last Rate Last Admin   bupivacaine (PF) (MARCAINE ) 0.5 % injection 2 mL  2 mL Other Once Sater, Sherida Dimmer, MD       lidocaine  (XYLOCAINE ) 2 % (with pres) injection 40 mg  2 mL Intradermal Once Jorie Newness, MD         Physical Exam    VS:  BP 118/78 (BP Location: Left Arm)   Pulse 69   Ht 5' (1.524 m)   Wt 180 lb (81.6 kg)   LMP 05/02/2017   SpO2 97%   BMI 35.15 kg/m  , BMI Body mass index is 35.15 kg/m.       GEN: Well nourished, well developed, in no acute distress. HEENT: normal. Neck: Supple, no JVD, carotid bruits, or masses. Cardiac: RRR, no murmurs, rubs, or gallops. No clubbing, cyanosis, edema.  Radials 2+/PT 2+ and equal bilaterally.  Respiratory:  Respirations regular and unlabored, clear to auscultation bilaterally. GI: Soft, nontender, nondistended, BS + x 4. MS: no deformity or atrophy. Skin: warm and dry, no rash. Neuro:  Strength and sensation are intact. Psych: Normal affect.  Accessory Clinical Findings    ECG personally reviewed by me today - EKG Interpretation Date/Time:  Friday Jul 06 2023 14:57:00 EDT Ventricular Rate:  69 PR Interval:  148 QRS  Duration:  80 QT Interval:  362 QTC Calculation: 387 R Axis:   29  Text Interpretation: Normal sinus rhythm Nonspecific ST and T wave abnormality Confirmed by Laneta Pintos [id number]) on 07/06/2023 3:04:12 PM  - no acute changes.  Lab Results  Component Value Date   WBC 5.3 05/08/2023   HGB 13.3 05/08/2023   HCT 42.6 05/08/2023   MCV 92 05/08/2023   PLT 118 (L) 05/08/2023   Lab Results  Component Value Date   CREATININE 0.66 04/12/2023   BUN 9 04/12/2023   NA 142 04/12/2023   K 4.5 04/12/2023   CL 104 04/12/2023   CO2 22 04/12/2023   Lab Results  Component Value Date   ALT 27 09/19/2021   AST 17 09/19/2021   ALKPHOS 81 09/19/2021   BILITOT 0.3 09/19/2021   Lab Results  Component Value Date   CHOL 184 04/12/2023   HDL 44 04/12/2023   LDLCALC 91 04/12/2023   TRIG 296 (H) 04/12/2023   CHOLHDL 4.2 04/12/2023    Lab Results  Component Value Date   HGBA1C 6.2 (H) 04/12/2023   Lab Results  Component Value Date   TSH 1.030 02/17/2021       Assessment & Plan    1.  Hyperlipidemia: Lab work in February of this year with an LDL of 91 and triglycerides of 296.  In addition to atorvastatin  80 mg, she was placed on Zetia  at that time.  She is tolerating this well.  She is fasting today and I will arrange for follow-up lipids and LFTs.  2.  Type 2 diabetes mellitus: Followed by primary care.  A1c 6.2 earlier this year.  She is on metformin .  3.  CNS vasculitis/headaches:  Intermittent right-sided headaches without seizures.  She has been managed by neurology, last seen March 2025.  4.  Diastolic dysfunction: Euvolemic on examination with stable heart rate and blood pressure.  5.  Disposition: Follow-up lipids and LFTs.  Follow-up in cardiology clinic in 1 year or sooner if necessary.  Laneta Pintos, NP 07/06/2023, 3:04 PM

## 2023-07-06 NOTE — Patient Instructions (Signed)
 Medication Instructions:  Your Physician recommend you continue on your current medication as directed.    *If you need a refill on your cardiac medications before your next appointment, please call your pharmacy*  Lab Work: Your provider would like for you to have following labs drawn today Complete Metabolic Panel and Lipid Panel.   If you have labs (blood work) drawn today and your tests are completely normal, you will receive your results only by: MyChart Message (if you have MyChart) OR A paper copy in the mail If you have any lab test that is abnormal or we need to change your treatment, we will call you to review the results.  Follow-Up: At [facility], you and your health needs are our priority.  As part of our continuing mission to provide you with exceptional heart care, our providers are all part of one team.  This team includes your primary Cardiologist (physician) and Advanced Practice Providers or APPs (Physician Assistants and Nurse Practitioners) who all work together to provide you with the care you need, when you need it.  Your next appointment:   12 month(s)  Provider:   You may see Constancia Delton, MD  We recommend signing up for the patient portal called "MyChart".  Sign up information is provided on this After Visit Summary.  MyChart is used to connect with patients for Virtual Visits (Telemedicine).  Patients are able to view lab/test results, encounter notes, upcoming appointments, etc.  Non-urgent messages can be sent to your provider as well.   To learn more about what you can do with MyChart, go to [email or web address].

## 2023-07-07 LAB — COMPREHENSIVE METABOLIC PANEL WITH GFR
ALT: 24 IU/L (ref 0–32)
AST: 16 IU/L (ref 0–40)
Albumin: 5.1 g/dL — ABNORMAL HIGH (ref 3.9–4.9)
Alkaline Phosphatase: 95 IU/L (ref 44–121)
BUN/Creatinine Ratio: 11 (ref 9–23)
BUN: 8 mg/dL (ref 6–24)
Bilirubin Total: 0.5 mg/dL (ref 0.0–1.2)
CO2: 21 mmol/L (ref 20–29)
Calcium: 10.7 mg/dL — ABNORMAL HIGH (ref 8.7–10.2)
Chloride: 103 mmol/L (ref 96–106)
Creatinine, Ser: 0.75 mg/dL (ref 0.57–1.00)
Globulin, Total: 2.3 g/dL (ref 1.5–4.5)
Glucose: 104 mg/dL — ABNORMAL HIGH (ref 70–99)
Potassium: 4 mmol/L (ref 3.5–5.2)
Sodium: 142 mmol/L (ref 134–144)
Total Protein: 7.4 g/dL (ref 6.0–8.5)
eGFR: 98 mL/min/{1.73_m2} (ref >59)

## 2023-07-07 LAB — LIPID PANEL
Chol/HDL Ratio: 4.1 ratio (ref 0.0–4.4)
Cholesterol, Total: 190 mg/dL (ref 100–199)
HDL: 46 mg/dL (ref >39)
LDL Chol Calc (NIH): 114 mg/dL — ABNORMAL HIGH (ref 0–99)
Triglycerides: 173 mg/dL — ABNORMAL HIGH (ref 0–149)
VLDL Cholesterol Cal: 30 mg/dL (ref 5–40)

## 2023-07-12 ENCOUNTER — Ambulatory Visit (INDEPENDENT_AMBULATORY_CARE_PROVIDER_SITE_OTHER): Admitting: Physician Assistant

## 2023-07-12 ENCOUNTER — Encounter (HOSPITAL_COMMUNITY): Payer: Self-pay | Admitting: Physician Assistant

## 2023-07-12 VITALS — BP 123/69 | HR 83 | Temp 98.2°F | Ht 60.0 in | Wt 181.0 lb

## 2023-07-12 DIAGNOSIS — F332 Major depressive disorder, recurrent severe without psychotic features: Secondary | ICD-10-CM | POA: Diagnosis not present

## 2023-07-12 DIAGNOSIS — F4310 Post-traumatic stress disorder, unspecified: Secondary | ICD-10-CM

## 2023-07-12 DIAGNOSIS — F411 Generalized anxiety disorder: Secondary | ICD-10-CM

## 2023-07-12 MED ORDER — BUPROPION HCL ER (XL) 150 MG PO TB24: 150.0000 mg | ORAL_TABLET | Freq: Every day | ORAL | 1 refills | Status: DC

## 2023-07-12 MED ORDER — VORTIOXETINE HBR 10 MG PO TABS: 10.0000 mg | ORAL_TABLET | Freq: Every day | ORAL | 0 refills | Status: DC

## 2023-07-12 NOTE — Progress Notes (Signed)
 [facility] MD/PA/NP OP Progress Note  07/12/2023 7:41 PM Angel Rodriguez  MRN:  [id number]  Chief Complaint:  Chief Complaint  Patient presents with   Follow-up   Medication Refill   HPI:   Angel Rodriguez "Angel Rodriguez" is a 48 year old female with a past psychiatric history significant for major depressive disorder (severe episode, without psychotic features), PTSD, and generalized anxiety disorder who presents to [facility] for follow-up and medication management.  Patient is currently being managed on the following psychiatric medications:  Bupropion  (Wellbutrin  XL) 150 mg 24-hour tablet daily Trintellix  20 mg daily  Patient denies experiencing any adverse side effects from the use of her current medication regimen.  She reports that when she is without her medications, her anxiety is elevated.  Patient continues to endorse depression and rates her depression a 7-8 out of 10 with 10 being most severe.  Patient endorses depressive episodes every day.  Patient endorses the following depressive symptoms: feelings of sadness, crying spells, lack of motivation, decreased energy, decreased concentration, irritability, feelings of guilt/worthlessness, helplessness.  Patient rates her anxiety as 5 out of 10 with medications and without medications, patient rates her anxiety at 10 out of 10.  Patient's main stressor revolves around difficulties obtaining medication for her autoimmune disease.  Patient also reports that she recently lost her job.  A PHQ-9 screen was performed with the patient scoring a 26.  A GAD-7 screen was also performed with the patient scoring a 12.  Patient is alert and oriented x 4, calm, cooperative, and fully engaged in conversation during the encounter.  Patient endorses depressed mood and states that she feels worthless.  Patient exhibits depressed mood with congruent affect.  Patient denies suicidal or homicidal ideations.  She further denies  auditory or visual hallucinations and does not appear to be responding to internal/external stimuli.  Patient endorses fair sleep and receives on average 3 to 5 hours of sleep per night.  Patient endorses decreased appetite needs on average 1 meal per day.  Patient denies alcohol consumption, tobacco use, or illicit drug use.  Visit Diagnosis:    ICD-10-CM   1. Severe episode of recurrent major depressive disorder, without psychotic features (HCC)  F33.2 vortioxetine  HBr (TRINTELLIX ) 10 MG TABS tablet    buPROPion  (WELLBUTRIN  XL) 150 MG 24 hr tablet    2. GAD (generalized anxiety disorder)  F41.1 vortioxetine  HBr (TRINTELLIX ) 10 MG TABS tablet     Past Psychiatric History:  Patient endorses a past psychiatric history significant for depression and anxiety   Patient denies a past history of hospitalization due to mental health but states that she has completed intensive outpatient [facility] from 11/28/2020 to 12/13/2020.   Patient was previously seen by Dr. Daryel Ensign from 06/06/2018 to 04/02/2020   Patient denies a past history of suicide attempt but states that she has had thoughts in the past.  Patient reports that she last had suicidal thoughts today.   Patient denies a past history of homicide attempt  Past Medical History:  Past Medical History:  Diagnosis Date   Anxiety    Blind right eye 2009   swelling & pressure   Cluster headaches    started after seizure and migraines   Complication of anesthesia    trouble with short term memory after surgeries   COVID-19    Depression    Diastolic dysfunction    a. 01/2021 Echo: EF 60-65%, no rwma, GrII DD, nl RV fxn, mildly dil LA. Triv  AI.   Fibroids    s/p TLH, bilateral salpingectomy   GERD (gastroesophageal reflux disease)    patient thinks due to medications   History of anemia    prior to hysterectomy   History of pneumonia    History of trichomoniasis    Seizures (HCC) x 1 2-3 yrs ago none since   once saw neurologist, full  body brain MRI and another 6 months lster   Status post right oophorectomy    Thrombocytopenia (HCC)    sees [place] with Heme Onc   Vasculitis (HCC)    neuro    Past Surgical History:  Procedure Laterality Date   CYSTOSCOPY N/A 07/31/2017   Procedure: CYSTOSCOPY;  Surgeon: Lillian Rein, MD;  Location: [facility];  Service: Gynecology;  Laterality: N/A;   ENDOMETRIAL ABLATION     EYE SURGERY     laser - removed blood from right eye   HYSTEROSCOPY WITH D & C     LAPAROSCOPY N/A 09/19/2017   Procedure: LAPAROSCOPY DIAGNOSTIC WITH RIGHT OOPHERECTOMY, LYSIS OF ADHESIONS;  Surgeon: Jertson, Jill Evelyn, MD;  Location: [facility];  Service: Gynecology;  Laterality: N/A;   OMENTECTOMY N/A 11/21/2017   Procedure: OMENTECTOMY AND PELVIC WASHINGS;  Surgeon: Lyn Sanders, MD;  Location: [facility];  Service: Gynecology;  Laterality: N/A;   ROBOTIC ASSISTED SALPINGO OOPHERECTOMY Left 11/21/2017   Procedure: XI ROBOTIC ASSISTED LEFT SALPINGO OOPHORECTOMY;  Surgeon: Lyn Sanders, MD;  Location: [facility];  Service: Gynecology;  Laterality: Left;   TOTAL LAPAROSCOPIC HYSTERECTOMY WITH SALPINGECTOMY Bilateral 07/31/2017   Procedure: TOTAL LAPAROSCOPIC HYSTERECTOMY WITH SALPINGECTOMY;  Surgeon: Lillian Rein, MD;  Location: [facility];  Service: Gynecology;  Laterality: Bilateral;  20 week size uterus/ Alexis bag in room/ need 4.5 hours   TUBAL LIGATION     WISDOM TOOTH EXTRACTION      Family Psychiatric History:  Son - bipolar disorder   Family history of suicide attempt: Patient denies Family history of homicide attempt: Patient denies Family history of substance abuse: Patient reports that her son used to use Roxie and heroin.  She reports that he has been clean for 3 years now.  Family History:  Family History  Problem Relation Age of Onset   Sleep apnea Mother    Alcoholism Father    Cirrhosis Father    Alcohol abuse Father    Cancer Maternal Grandmother        unsure of type; died of other causes    Heart disease Maternal Aunt    Drug abuse Cousin     Social History:  Social History   Socioeconomic History   Marital status: Single    Spouse name: Not on file   Number of children: 3   Years of education: Not on file   Highest education level: Associate degree: occupational, Scientist, product/process development, or vocational program  Occupational History   Not on file  Tobacco Use   Smoking status: Former    Current packs/day: 0.00    Types: Cigarettes, E-cigarettes    Start date: 05/05/1998    Quit date: 05/05/2018    Years since quitting: 5.1   Smokeless tobacco: Never  Vaping Use   Vaping status: Former  Substance and Sexual Activity   Alcohol use: Not Currently   Drug use: No   Sexual activity: Yes    Birth control/protection: Surgical    Comment: hysterectomy  Other Topics Concern   Not on file  Social History Narrative   Not on file  Social Drivers of Health   Financial Resource Strain: Medium Risk (04/12/2023)   Overall Financial Resource Strain (CARDIA)    Difficulty of Paying Living Expenses: Somewhat hard  Food Insecurity: Food Insecurity Present (04/12/2023)   Hunger Vital Sign    Worried About Running Out of Food in the Last Year: Often true    Ran Out of Food in the Last Year: Sometimes true  Transportation Needs: No Transportation Needs (04/12/2023)   PRAPARE - Administrator, Civil Service (Medical): No    Lack of Transportation (Non-Medical): No  Physical Activity: Inactive (04/12/2023)   Exercise Vital Sign    Days of Exercise per Week: 0 days    Minutes of Exercise per Session: 0 min  Stress: Stress Concern Present (04/12/2023)   [employer] of Occupational Health - Occupational Stress Questionnaire    Feeling of Stress : Very much  Social Connections: Socially Isolated (04/12/2023)   Social Connection and Isolation Panel [NHANES]    Frequency of Communication with Friends and Family: Never    Frequency of Social Gatherings with Friends and Family: Never     Attends Religious Services: Never    Database administrator or Organizations: No    Attends Banker Meetings: Never    Marital Status: Never married    Allergies:  Allergies  Allergen Reactions   Ivp Dye [Iodinated Contrast Media] Hives, Itching and Swelling   Bee Venom Swelling   Buspar  [Buspirone ] Other (See Comments)    Metabolic Disorder Labs: Lab Results  Component Value Date   HGBA1C 6.2 (H) 04/12/2023   MPG 108.28 03/14/2018   No results found for: "PROLACTIN" Lab Results  Component Value Date   CHOL 190 07/06/2023   TRIG 173 (H) 07/06/2023   HDL 46 07/06/2023   CHOLHDL 4.1 07/06/2023   VLDL 34 10/13/2021   LDLCALC 114 (H) 07/06/2023   LDLCALC 91 04/12/2023   Lab Results  Component Value Date   TSH 1.030 02/17/2021   TSH 1.730 07/09/2020    Therapeutic Level Labs: No results found for: "LITHIUM" No results found for: "VALPROATE" No results found for: "CBMZ"  Current Medications: Current Outpatient Medications  Medication Sig Dispense Refill   vortioxetine  HBr (TRINTELLIX ) 10 MG TABS tablet Take 1 tablet (10 mg total) by mouth daily. 28 tablet 0   Accu-Chek Softclix Lancets lancets Use as instructed 100 each 12   acetaminophen -codeine  (TYLENOL  #3) 300-30 MG tablet TAKE ONE TABLET BY MOUTH UP TO FOUR TIMES A DAY AS NEEDED 28 tablet 2   Ascorbic Acid (VITAMIN C) 1000 MG tablet Take 1,000 mg by mouth daily.     atorvastatin  (LIPITOR) 80 MG tablet Take 1 tablet (80 mg total) by mouth daily. 30 tablet 0   atropine  1 % ophthalmic solution One drop into affected eye up to tid 5 mL 3   b complex vitamins capsule Take 1 capsule by mouth daily.     Blood Glucose Monitoring Suppl (ACCU-CHEK GUIDE ME) w/Device KIT USE TO CHECK FASTING BLOOD SUGAR AND 2 HOURS AFTER LARGEST MEAL 1 kit 0   buPROPion  (WELLBUTRIN  XL) 150 MG 24 hr tablet Take 1 tablet (150 mg total) by mouth daily. 90 tablet 1   calcium  carbonate (TUMS EX) 750 MG chewable tablet Chew 2 tablets  by mouth daily as needed for heartburn.      clonazePAM  (KLONOPIN ) 1 MG tablet TAKE 1 TABLET BY MOUTH TWICE A DAY 60 tablet 5   dorzolamide -timolol  (COSOPT ) 22.3-6.8 MG/ML  ophthalmic solution Place 1 drop into the right eye 2 (two) times daily.      ezetimibe  (ZETIA ) 10 MG tablet Take 1 tablet (10 mg total) by mouth daily. 90 tablet 3   fluticasone  (FLONASE ) 50 MCG/ACT nasal spray USE 1 -2 SPRAYS INTO BOTH NOSTRILS DAILY AS NEEDED FOR ALLERGIES 48 mL 1   glucose blood (ACCU-CHEK GUIDE) test strip Use to check fasting glucose and 2 hours after largest meal 100 each 12   Lacosamide  100 MG TABS Take 1 tablet (100 mg total) by mouth 2 (two) times daily. 180 tablet 1   latanoprost  (XALATAN ) 0.005 % ophthalmic solution Place 1 drop into both eyes at bedtime.     metFORMIN  (GLUCOPHAGE ) 500 MG tablet Take 2 tablets (1000mg )  by mouth in the morning and 1 tablet (500mg ) by mouth in the evening. 180 tablet 3   moxifloxacin (VIGAMOX) 0.5 % ophthalmic solution Place into both eyes 3 (three) times daily.     nystatin  cream (MYCOSTATIN ) Apply 1 application topically 2 (two) times daily. Apply to affected area BID for up to 7 days. 30 g 1   ondansetron  (ZOFRAN -ODT) 4 MG disintegrating tablet Take 1 tablet (4 mg total) by mouth every 8 (eight) hours as needed for nausea or vomiting. 20 tablet 11   pilocarpine  (SALAGEN ) 5 MG tablet Take 1 tablet (5 mg total) by mouth 2 (two) times daily. 60 tablet 11   SUMAtriptan  (IMITREX ) 100 MG tablet Take 1 tablet (100 mg total) by mouth once as needed for migraine. May repeat in 2 hours if headache persists or recurs. 9 tablet 5   verapamil  (CALAN -SR) 240 MG CR tablet Take 1 tablet (240 mg total) by mouth at bedtime. 90 tablet 2   vortioxetine  HBr (TRINTELLIX ) 20 MG TABS tablet Take 1 tablet (20 mg total) by mouth daily. 30 tablet 1   Current Facility-Administered Medications  Medication Dose Route Frequency Provider Last Rate Last Admin   bupivacaine (PF) (MARCAINE ) 0.5 %  injection 2 mL  2 mL Other Once Sater, Sherida Dimmer, MD       lidocaine  (XYLOCAINE ) 2 % (with pres) injection 40 mg  2 mL Intradermal Once Sater, Sherida Dimmer, MD         Musculoskeletal: Strength & Muscle Tone: within normal limits Gait & Station: normal Patient leans: N/A  Psychiatric Specialty Exam: Review of Systems  Psychiatric/Behavioral:  Positive for dysphoric mood and sleep disturbance. Negative for decreased concentration, hallucinations, self-injury and suicidal ideas. The patient is nervous/anxious. The patient is not hyperactive.     Blood pressure 123/69, pulse 83, temperature 98.2 F (36.8 C), temperature source Oral, height 5' (1.524 m), weight 181 lb (82.1 kg), last menstrual period 05/02/2017, SpO2 96%.Body mass index is 35.35 kg/m.  General Appearance: Casual  Eye Contact:  Good  Speech:  Clear and Coherent and Normal Rate  Volume:  Normal  Mood:  Anxious and Depressed  Affect:  Congruent and Tearful  Thought Process:  Coherent, Goal Directed, and Descriptions of Associations: Intact  Orientation:  Full (Time, Place, and Person)  Thought Content: WDL   Suicidal Thoughts:  No  Homicidal Thoughts:  No  Memory:  Immediate;   Good Recent;   Good Remote;   Good  Judgement:  Good  Insight:  Good  Psychomotor Activity:  Normal  Concentration:  Concentration: Good and Attention Span: Good  Recall:  Good  Fund of Knowledge: Good  Language: Good  Akathisia:  No  Handed:  Right  AIMS (if  indicated): not done  Assets:  Communication Skills Desire for Improvement Housing Social Support Transportation Vocational/Educational  ADL's:  Intact  Cognition: WNL  Sleep:  Fair   Screenings: GAD-7    Flowsheet Row Clinical Support from 07/12/2023 in [facility] Clinical Support from 05/29/2023 in [facility] Office Visit from 04/17/2023 in [facility] Counselor from 04/12/2023 in [facility] Office Visit from 04/24/2022 in [facility] at [facility]  Total GAD-7 Score 12 16 19 18 8       PHQ2-9    Flowsheet Row Clinical Support from 07/12/2023 in [facility] Most recent reading at 07/12/2023 11:21 AM Clinical Support from 05/29/2023 in [facility] Most recent reading at 05/29/2023  2:34 PM Office Visit from 04/17/2023 in [facility] Most recent reading at 04/17/2023  2:02 PM Counselor from 04/12/2023 in [facility] Most recent reading at 04/12/2023  1:11 PM Office Visit from 04/12/2023 in [facility] at [facility] Most recent reading at 04/12/2023  8:58 AM  PHQ-2 Total Score 6 5 6 3 3   PHQ-9 Total Score 26 21 26 23 21       Flowsheet Row Clinical Support from 07/12/2023 in [facility] Clinical Support from 05/29/2023 in [facility] Office Visit from 04/17/2023 in [facility]  C-SSRS RISK CATEGORY Low Risk No Risk Low Risk        Assessment and Plan:   Angel Rodriguez "Angel Rodriguez" is a 48 year old female with a past psychiatric history significant for major depressive disorder (severe episode, without psychotic features), PTSD, and generalized anxiety disorder who presents to [facility] for follow-up and medication management.  Patient presents to the encounter stating that she continues to take her medications regularly.  Though patient continues to take her medications regularly, she still endorses depression and anxiety that appear to be attributed to outside stressors such as recently losing her job and being without her autoimmune disease medication.  A PHQ-9 screen was performed with the patient scoring a 26.  A GAD-7 screen was also performed with the patient scoring a  12.  Patient informed provider that she still has been having trouble receiving prescriptions for her Trintellix  20 mg daily.  Provider to provide patient with samples of Trintellix  10 mg daily.  Patient was instructed to double up on the samples for Trintellix  to equate to 20 mg daily.  Patient vocalized understanding.  Provider to work with staff to get her approved for Trintellix .  Patient's Wellbutrin  XL to be e-prescribed to pharmacy of choice.  Patient denies suicidal ideations and was able to contract for safety following the conclusion of the encounter.  Collaboration of Care: Collaboration of Care: Medication Management AEB provider managing patient's psychiatric medications, Primary Care Provider AEB patient being seen by a primary care provider, Psychiatrist AEB patient being followed by mental health provider at this facility, and Other provider involved in patient's care AEB patient being seen by neurology  Patient/Guardian was advised Release of Information must be obtained prior to any record release in order to collaborate their care with an outside provider. Patient/Guardian was advised if they have not already done so to contact the registration department to sign all necessary forms in order for us  to release information regarding their care.  Consent: Patient/Guardian gives verbal consent for treatment and assignment of benefits for services provided during this visit. Patient/Guardian expressed understanding and agreed to proceed.   1. Severe episode of recurrent major depressive disorder, without psychotic features (HCC)  - vortioxetine  HBr (TRINTELLIX ) 10 MG TABS tablet; Take 1 tablet (10 mg total) by mouth daily.  Dispense: 28 tablet; Refill: 0 - buPROPion  (WELLBUTRIN  XL) 150 MG 24 hr tablet; Take 1 tablet (150 mg total) by mouth daily.  Dispense: 90 tablet; Refill: 1  2. GAD (generalized anxiety disorder)  - vortioxetine  HBr (TRINTELLIX ) 10 MG TABS tablet; Take 1 tablet  (10 mg total) by mouth daily.  Dispense: 28 tablet; Refill: 0  3. PTSD (post-traumatic stress disorder) (Primary)  Patient to follow up in 6 weeks Provider spent a total of 37 minutes with the patient/reviewing patient's chart  Gates Kasal, PA 07/12/2023, 7:41 PM

## 2023-07-15 ENCOUNTER — Other Ambulatory Visit: Payer: Self-pay | Admitting: Nurse Practitioner

## 2023-07-15 DIAGNOSIS — E119 Type 2 diabetes mellitus without complications: Secondary | ICD-10-CM

## 2023-07-16 ENCOUNTER — Ambulatory Visit: Payer: 59 | Admitting: Neurology

## 2023-07-18 ENCOUNTER — Other Ambulatory Visit: Payer: Self-pay | Admitting: Neurology

## 2023-07-18 ENCOUNTER — Other Ambulatory Visit: Payer: Self-pay | Admitting: *Deleted

## 2023-07-18 DIAGNOSIS — I776 Arteritis, unspecified: Secondary | ICD-10-CM

## 2023-07-18 MED ORDER — RUXIENCE 500 MG/50ML IV SOLN: INTRAVENOUS | 1 refills | Status: AC

## 2023-07-19 NOTE — Telephone Encounter (Signed)
 Called [facility] at  [phone]. Spoke w/ Melba Spittle. Transferred me to pharmacist, Bridgette Campus. Confirmed rx below correct. They verbalized understanding. Nothing further needed.

## 2023-07-24 ENCOUNTER — Telehealth: Payer: Self-pay | Admitting: *Deleted

## 2023-07-24 NOTE — Telephone Encounter (Signed)
 Per [facility], Ruxience  denied by insurance. Truxima preferred. Per Dr. Godwin Lat, would like to try and get Truxima approved buy/bill. New order given to [facility].

## 2023-08-01 ENCOUNTER — Telehealth: Payer: Self-pay | Admitting: Neurology

## 2023-08-01 NOTE — Telephone Encounter (Signed)
 Messaged [facility] to see if they got approval for Truxima. Needing to confirm prior to sending in prescription. Waiting on response

## 2023-08-01 NOTE — Telephone Encounter (Signed)
 Blue @ [facility] Request for new Rx for [employer]

## 2023-08-02 NOTE — Telephone Encounter (Signed)
 Per [facility], they have not heard yet about whether Truxima approved or not, asked to hold off on sending in prescription right now until they hear from insurance.

## 2023-08-03 ENCOUNTER — Other Ambulatory Visit: Payer: Self-pay

## 2023-08-03 MED ORDER — ATORVASTATIN CALCIUM 80 MG PO TABS: 80.0000 mg | ORAL_TABLET | Freq: Every day | ORAL | 11 refills | Status: AC

## 2023-08-06 ENCOUNTER — Encounter: Payer: Self-pay | Admitting: Neurology

## 2023-08-10 ENCOUNTER — Ambulatory Visit (HOSPITAL_BASED_OUTPATIENT_CLINIC_OR_DEPARTMENT_OTHER): Payer: 59 | Admitting: Family Medicine

## 2023-08-13 NOTE — Telephone Encounter (Signed)
 I called with Holly in [place] and she said:    I called and spoke to patinet and asked her to upload the approval letter via my chart. Pt said she was at work and will do so when she gets home later.

## 2023-08-13 NOTE — Telephone Encounter (Signed)
 Pt has called to report that she has received a letter from her insurance company that they have approved the Truxima .  Pt is asking that Dr Godwin Lat places the order so that she can get scheduled as soon as possible.

## 2023-08-14 ENCOUNTER — Ambulatory Visit (INDEPENDENT_AMBULATORY_CARE_PROVIDER_SITE_OTHER): Admitting: Physician Assistant

## 2023-08-14 ENCOUNTER — Encounter (HOSPITAL_COMMUNITY): Payer: Self-pay | Admitting: Physician Assistant

## 2023-08-14 VITALS — BP 128/76 | HR 65 | Ht 60.0 in | Wt 192.4 lb

## 2023-08-14 DIAGNOSIS — F411 Generalized anxiety disorder: Secondary | ICD-10-CM | POA: Diagnosis not present

## 2023-08-14 DIAGNOSIS — F4310 Post-traumatic stress disorder, unspecified: Secondary | ICD-10-CM | POA: Diagnosis not present

## 2023-08-14 DIAGNOSIS — F332 Major depressive disorder, recurrent severe without psychotic features: Secondary | ICD-10-CM | POA: Diagnosis not present

## 2023-08-14 MED ORDER — BUPROPION HCL ER (XL) 150 MG PO TB24: 150.0000 mg | ORAL_TABLET | Freq: Every day | ORAL | 1 refills | Status: AC

## 2023-08-14 NOTE — Progress Notes (Signed)
 [facility] MD/PA/NP OP Progress Note  08/14/2023 9:46 PM Angel Rodriguez  MRN:  [id number]  Chief Complaint:  Chief Complaint  Patient presents with   Follow-up   Medication Refill   HPI:   Angel Rodriguez. Angel Rodriguez is a 48 year old female with a past psychiatric history significant for major depressive disorder (severe episode, without psychotic features), PTSD, and generalized anxiety disorder who presents to [facility] for follow-up and medication management.  Patient is currently being managed on the following psychiatric medications:  Bupropion  (Wellbutrin  XL) 150 mg 24-hour tablet daily Trintellix  20 mg daily  Patient reports that she was able to acquire a job and is currently working.  Patient also reports that she was recently approved for an infusion treatment for her autoimmune disorder.  Patient reports that without her medications, she would be a wreck.  She reports that she continues to take her Wellbutrin  150 mg regularly.  Patient is interested in adjusting her dosage of Wellbutrin ; however, given her history of seizures, patient to remain on Wellbutrin  150 mg daily.  Despite taking her medications regularly, patient reports that she still continues to experience some depression she rates a 5 out of 10 with 10 being most severe.  Patient endorses depressive episodes every day.  Patient attributes her depression to concerns over her new job.  Patient endorses the following depressive symptoms: decreased sleep, feelings of sadness, crying spells, and increased appetite (binge eating).  Patient continues to endorse anxiety and states that at its worst, her anxiety can be an 8 or 9 out of 10.  Patient attributes her anxiety to trying to obtain access to the medications that she needs.  A PHQ-9 screen was performed with the patient scoring a 15.  A GAD-7 screen was also performed with the patient scoring a 15.  Patient is alert and oriented x 4, calm,  cooperative, and fully engaged in conversation during the encounter.  Patient endorses okay mood.  Patient exhibits depressed mood with appropriate affect.  Patient denies suicidal or homicidal ideations.  She further denies auditory or visual hallucinations and does not appear to be responding to internal/external stimuli.  Patient endorses fair sleep and receives on average 5 to 6 hours of sleep per night.  Patient endorses fair appetite and eats on average 2 meals per day.  Patient denies alcohol consumption, tobacco use, or illicit drug use.  Visit Diagnosis:    ICD-10-CM   1. Severe episode of recurrent major depressive disorder, without psychotic features (HCC)  F33.2 buPROPion  (WELLBUTRIN  XL) 150 MG 24 hr tablet      Past Psychiatric History:  Patient endorses a past psychiatric history significant for depression and anxiety   Patient denies a past history of hospitalization due to mental health but states that she has completed intensive outpatient [facility] from 11/28/2020 to 12/13/2020.   Patient was previously seen by Dr. Daryel Ensign from 06/06/2018 to 04/02/2020   Patient denies a past history of suicide attempt but states that she has had thoughts in the past.  Patient reports that she last had suicidal thoughts today.   Patient denies a past history of homicide attempt  Past Medical History:  Past Medical History:  Diagnosis Date   Anxiety    Blind right eye 2009   swelling & pressure   Cluster headaches    started after seizure and migraines   Complication of anesthesia    trouble with short term memory after surgeries   COVID-19  Depression    Diastolic dysfunction    a. 01/2021 Echo: EF 60-65%, no rwma, GrII DD, nl RV fxn, mildly dil LA. Triv AI.   Fibroids    s/p TLH, bilateral salpingectomy   GERD (gastroesophageal reflux disease)    patient thinks due to medications   History of anemia    prior to hysterectomy   History of pneumonia    History of trichomoniasis     Seizures (HCC) x 1 2-3 yrs ago none since   once saw neurologist, full body brain MRI and another 6 months lster   Status post right oophorectomy    Thrombocytopenia (HCC)    sees [place] with Heme Onc   Vasculitis (HCC)    neuro    Past Surgical History:  Procedure Laterality Date   CYSTOSCOPY N/A 07/31/2017   Procedure: CYSTOSCOPY;  Surgeon: Lillian Rein, MD;  Location: [facility];  Service: Gynecology;  Laterality: N/A;   ENDOMETRIAL ABLATION     EYE SURGERY     laser - removed blood from right eye   HYSTEROSCOPY WITH D & C     LAPAROSCOPY N/A 09/19/2017   Procedure: LAPAROSCOPY DIAGNOSTIC WITH RIGHT OOPHERECTOMY, LYSIS OF ADHESIONS;  Surgeon: Jertson, Jill Evelyn, MD;  Location: [facility];  Service: Gynecology;  Laterality: N/A;   OMENTECTOMY N/A 11/21/2017   Procedure: OMENTECTOMY AND PELVIC WASHINGS;  Surgeon: Lyn Sanders, MD;  Location: [facility];  Service: Gynecology;  Laterality: N/A;   ROBOTIC ASSISTED SALPINGO OOPHERECTOMY Left 11/21/2017   Procedure: XI ROBOTIC ASSISTED LEFT SALPINGO OOPHORECTOMY;  Surgeon: Lyn Sanders, MD;  Location: [facility];  Service: Gynecology;  Laterality: Left;   TOTAL LAPAROSCOPIC HYSTERECTOMY WITH SALPINGECTOMY Bilateral 07/31/2017   Procedure: TOTAL LAPAROSCOPIC HYSTERECTOMY WITH SALPINGECTOMY;  Surgeon: Lillian Rein, MD;  Location: [facility];  Service: Gynecology;  Laterality: Bilateral;  20 week size uterus/ Alexis bag in room/ need 4.5 hours   TUBAL LIGATION     WISDOM TOOTH EXTRACTION      Family Psychiatric History:  Son - bipolar disorder   Family history of suicide attempt: Patient denies Family history of homicide attempt: Patient denies Family history of substance abuse: Patient reports that her son used to use Roxie and heroin.  She reports that he has been clean for 3 years now.  Family History:  Family History  Problem Relation Age of Onset   Sleep apnea Mother    Alcoholism Father    Cirrhosis Father    Alcohol abuse Father     Cancer Maternal Grandmother        unsure of type; died of other causes   Heart disease Maternal Aunt    Drug abuse Cousin     Social History:  Social History   Socioeconomic History   Marital status: Single    Spouse name: Not on file   Number of children: 3   Years of education: Not on file   Highest education level: Associate degree: occupational, Scientist, product/process development, or vocational program  Occupational History   Not on file  Tobacco Use   Smoking status: Former    Current packs/day: 0.00    Types: Cigarettes, E-cigarettes    Start date: 05/05/1998    Quit date: 05/05/2018    Years since quitting: 5.2   Smokeless tobacco: Never  Vaping Use   Vaping status: Former  Substance and Sexual Activity   Alcohol use: Not Currently   Drug use: No   Sexual activity: Yes    Birth  control/protection: Surgical    Comment: hysterectomy  Other Topics Concern   Not on file  Social History Narrative   Not on file   Social Drivers of Health   Financial Resource Strain: Medium Risk (04/12/2023)   Overall Financial Resource Strain (CARDIA)    Difficulty of Paying Living Expenses: Somewhat hard  Food Insecurity: Food Insecurity Present (04/12/2023)   Hunger Vital Sign    Worried About Running Out of Food in the Last Year: Often true    Ran Out of Food in the Last Year: Sometimes true  Transportation Needs: No Transportation Needs (04/12/2023)   PRAPARE - Administrator, Civil Service (Medical): No    Lack of Transportation (Non-Medical): No  Physical Activity: Inactive (04/12/2023)   Exercise Vital Sign    Days of Exercise per Week: 0 days    Minutes of Exercise per Session: 0 min  Stress: Stress Concern Present (04/12/2023)   [employer] of Occupational Health - Occupational Stress Questionnaire    Feeling of Stress : Very much  Social Connections: Socially Isolated (04/12/2023)   Social Connection and Isolation Panel [NHANES]    Frequency of Communication with Friends and Family:  Never    Frequency of Social Gatherings with Friends and Family: Never    Attends Religious Services: Never    Database administrator or Organizations: No    Attends Banker Meetings: Never    Marital Status: Never married    Allergies:  Allergies  Allergen Reactions   Ivp Dye [Iodinated Contrast Media] Hives, Itching and Swelling   Bee Venom Swelling   Buspar  [Buspirone ] Other (See Comments)    Metabolic Disorder Labs: Lab Results  Component Value Date   HGBA1C 6.2 (H) 04/12/2023   MPG 108.28 03/14/2018   No results found for: PROLACTIN Lab Results  Component Value Date   CHOL 190 07/06/2023   TRIG 173 (H) 07/06/2023   HDL 46 07/06/2023   CHOLHDL 4.1 07/06/2023   VLDL 34 10/13/2021   LDLCALC 114 (H) 07/06/2023   LDLCALC 91 04/12/2023   Lab Results  Component Value Date   TSH 1.030 02/17/2021   TSH 1.730 07/09/2020    Therapeutic Level Labs: No results found for: LITHIUM No results found for: VALPROATE No results found for: CBMZ  Current Medications: Current Outpatient Medications  Medication Sig Dispense Refill   Accu-Chek Softclix Lancets lancets Use as instructed 100 each 12   acetaminophen -codeine  (TYLENOL  #3) 300-30 MG tablet TAKE ONE TABLET BY MOUTH UP TO FOUR TIMES A DAY AS NEEDED 28 tablet 2   Ascorbic Acid (VITAMIN C) 1000 MG tablet Take 1,000 mg by mouth daily.     atorvastatin  (LIPITOR) 80 MG tablet Take 1 tablet (80 mg total) by mouth daily. 30 tablet 11   atropine  1 % ophthalmic solution One drop into affected eye up to tid 5 mL 3   b complex vitamins capsule Take 1 capsule by mouth daily.     Blood Glucose Monitoring Suppl (ACCU-CHEK GUIDE ME) w/Device KIT USE TO CHECK FASTING BLOOD SUGAR AND 2 HOURS AFTER LARGEST MEAL 1 kit 0   buPROPion  (WELLBUTRIN  XL) 150 MG 24 hr tablet Take 1 tablet (150 mg total) by mouth daily. 90 tablet 1   calcium  carbonate (TUMS EX) 750 MG chewable tablet Chew 2 tablets by mouth daily as needed for  heartburn.      clonazePAM  (KLONOPIN ) 1 MG tablet TAKE 1 TABLET BY MOUTH TWICE A DAY 60 tablet 5  dorzolamide -timolol  (COSOPT ) 22.3-6.8 MG/ML ophthalmic solution Place 1 drop into the right eye 2 (two) times daily.      ezetimibe  (ZETIA ) 10 MG tablet Take 1 tablet (10 mg total) by mouth daily. 90 tablet 3   fluticasone  (FLONASE ) 50 MCG/ACT nasal spray USE 1 -2 SPRAYS INTO BOTH NOSTRILS DAILY AS NEEDED FOR ALLERGIES 48 mL 1   glucose blood (ACCU-CHEK GUIDE) test strip Use to check fasting glucose and 2 hours after largest meal 100 each 12   Lacosamide  100 MG TABS Take 1 tablet (100 mg total) by mouth 2 (two) times daily. 180 tablet 1   latanoprost  (XALATAN ) 0.005 % ophthalmic solution Place 1 drop into both eyes at bedtime.     metFORMIN  (GLUCOPHAGE ) 500 MG tablet Take 2 tablets (1000mg )  by mouth in the morning and 1 tablet (500mg ) by mouth in the evening. 180 tablet 3   moxifloxacin (VIGAMOX) 0.5 % ophthalmic solution Place into both eyes 3 (three) times daily.     nystatin  cream (MYCOSTATIN ) Apply 1 application topically 2 (two) times daily. Apply to affected area BID for up to 7 days. 30 g 1   ondansetron  (ZOFRAN -ODT) 4 MG disintegrating tablet Take 1 tablet (4 mg total) by mouth every 8 (eight) hours as needed for nausea or vomiting. 20 tablet 11   pilocarpine  (SALAGEN ) 5 MG tablet Take 1 tablet (5 mg total) by mouth 2 (two) times daily. 60 tablet 11   RUXIENCE  500 MG/50ML injection 1000mg  IV once every 24 weeks 100 mL 1   SUMAtriptan  (IMITREX ) 100 MG tablet Take 1 tablet (100 mg total) by mouth once as needed for migraine. May repeat in 2 hours if headache persists or recurs. 9 tablet 5   verapamil  (CALAN -SR) 240 MG CR tablet Take 1 tablet (240 mg total) by mouth at bedtime. 90 tablet 2   vortioxetine  HBr (TRINTELLIX ) 10 MG TABS tablet Take 1 tablet (10 mg total) by mouth daily. 28 tablet 0   vortioxetine  HBr (TRINTELLIX ) 20 MG TABS tablet Take 1 tablet (20 mg total) by mouth daily. 30 tablet  1   Current Facility-Administered Medications  Medication Dose Route Frequency Provider Last Rate Last Admin   bupivacaine (PF) (MARCAINE ) 0.5 % injection 2 mL  2 mL Other Once Sater, Sherida Dimmer, MD       lidocaine  (XYLOCAINE ) 2 % (with pres) injection 40 mg  2 mL Intradermal Once Sater, Sherida Dimmer, MD         Musculoskeletal: Strength & Muscle Tone: within normal limits Gait & Station: normal Patient leans: N/A  Psychiatric Specialty Exam: Review of Systems  Psychiatric/Behavioral:  Positive for dysphoric mood and sleep disturbance. Negative for decreased concentration, hallucinations, self-injury and suicidal ideas. The patient is nervous/anxious. The patient is not hyperactive.     Blood pressure 128/76, pulse 65, height 5' (1.524 m), weight 192 lb 6.4 oz (87.3 kg), last menstrual period 05/02/2017, SpO2 95%.Body mass index is 37.58 kg/m.  General Appearance: Casual  Eye Contact:  Good  Speech:  Clear and Coherent and Normal Rate  Volume:  Normal  Mood:  Anxious and Depressed  Affect:  Congruent  Thought Process:  Coherent, Goal Directed, and Descriptions of Associations: Intact  Orientation:  Full (Time, Place, and Person)  Thought Content: WDL   Suicidal Thoughts:  No  Homicidal Thoughts:  No  Memory:  Immediate;   Good Recent;   Good Remote;   Good  Judgement:  Good  Insight:  Good  Psychomotor Activity:  Normal  Concentration:  Concentration: Good and Attention Span: Good  Recall:  Good  Fund of Knowledge: Good  Language: Good  Akathisia:  No  Handed:  Right  AIMS (if indicated): not done  Assets:  Communication Skills Desire for Improvement Housing Social Support Transportation Vocational/Educational  ADL's:  Intact  Cognition: WNL  Sleep:  Fair   Screenings: GAD-7    Flowsheet Row Clinical Support from 08/14/2023 in [facility] Clinical Support from 07/12/2023 in [facility] Clinical Support from  05/29/2023 in [facility] Office Visit from 04/17/2023 in [facility] Counselor from 04/12/2023 in [facility]  Total GAD-7 Score 15 12 16 19 18       PHQ2-9    Flowsheet Row Clinical Support from 08/14/2023 in [facility] Clinical Support from 07/12/2023 in [facility] Clinical Support from 05/29/2023 in [facility] Office Visit from 04/17/2023 in [facility] Counselor from 04/12/2023 in [facility]  PHQ-2 Total Score 3 6 5 6 3   PHQ-9 Total Score 15 26 21 26 23       Flowsheet Row Clinical Support from 08/14/2023 in [facility] Clinical Support from 07/12/2023 in [facility] Clinical Support from 05/29/2023 in [facility]  C-SSRS RISK CATEGORY No Risk Low Risk No Risk        Assessment and Plan:   Angel Rodriguez. Angel Rodriguez is a 48 year old female with a past psychiatric history significant for major depressive disorder (severe episode, without psychotic features), PTSD, and generalized anxiety disorder who presents to [facility] for follow-up and medication management.  Patient presents to the encounter stating that she continues to take her medications regularly and denies experiencing any adverse side effects.  Due to her past history of seizure activity, provider unable to adjust patient's Wellbutrin  dosage.  Patient to continue to remain on Wellbutrin  150 mg daily.  Despite taking her medications regularly, patient continues to endorse depression and anxiety attributed to work related stressors and obtaining access to her medications.  A PHQ-9 screen was performed with the patient scoring a 15.  A GAD-7 screen was also performed with the patient  scoring a 15.  Provider still awaiting for authorization for use of Trintellix  for the patient.  Provider to provide samples of Trintellix  for the patient while awaiting prior authorization.  Patient's medication to be e-prescribed to pharmacy of choice.  Patient denies suicidal ideations and is able to contract for safety following the conclusion of the encounter.  Collaboration of Care: Collaboration of Care: Medication Management AEB provider managing patient's psychiatric medications, Primary Care Provider AEB patient being seen by a primary care provider, Psychiatrist AEB patient being followed by mental health provider at this facility, and Other provider involved in patient's care AEB patient being seen by neurology  Patient/Guardian was advised Release of Information must be obtained prior to any record release in order to collaborate their care with an outside provider. Patient/Guardian was advised if they have not already done so to contact the registration department to sign all necessary forms in order for us  to release information regarding their care.   Consent: Patient/Guardian gives verbal consent for treatment and assignment of benefits for services provided during this visit. Patient/Guardian expressed understanding and agreed to proceed.   1. Severe episode of recurrent major  depressive disorder, without psychotic features (HCC) Patient to be provided samples of Trintellix  while awaiting prior authorization  - buPROPion  (WELLBUTRIN  XL) 150 MG 24 hr tablet; Take 1 tablet (150 mg total) by mouth daily.  Dispense: 90 tablet; Refill: 1  2. GAD (generalized anxiety disorder) (Primary) Patient to be provided samples of Trintellix  while awaiting prior authorization  3. PTSD (post-traumatic stress disorder)  Patient to follow up in 2 months Provider spent a total of 29 minutes with the patient/reviewing patient's chart  Gates Kasal, PA 08/14/2023, 9:46 PM

## 2023-08-22 DIAGNOSIS — I677 Cerebral arteritis, not elsewhere classified: Secondary | ICD-10-CM | POA: Diagnosis not present

## 2023-08-23 ENCOUNTER — Telehealth: Payer: Self-pay | Admitting: Neurology

## 2023-08-23 NOTE — Telephone Encounter (Signed)
 LVM and sent mychart msg informing pt of need to reschedule 11/20/23 appt - MD Out

## 2023-09-10 ENCOUNTER — Telehealth (HOSPITAL_COMMUNITY): Payer: Self-pay

## 2023-09-10 NOTE — Telephone Encounter (Signed)
 Pt states that she wants a phone call from provider to see if she can get samples of   vortioxetine  HBr (TRINTELLIX ).    JNL

## 2023-09-12 ENCOUNTER — Other Ambulatory Visit: Payer: Self-pay

## 2023-09-18 ENCOUNTER — Other Ambulatory Visit: Payer: Self-pay

## 2023-09-20 ENCOUNTER — Other Ambulatory Visit: Payer: Self-pay

## 2023-09-21 ENCOUNTER — Other Ambulatory Visit: Payer: Self-pay

## 2023-10-03 ENCOUNTER — Ambulatory Visit (HOSPITAL_BASED_OUTPATIENT_CLINIC_OR_DEPARTMENT_OTHER): Admitting: Family Medicine

## 2023-10-03 ENCOUNTER — Other Ambulatory Visit (HOSPITAL_BASED_OUTPATIENT_CLINIC_OR_DEPARTMENT_OTHER): Payer: Self-pay | Admitting: Family Medicine

## 2023-10-03 ENCOUNTER — Telehealth (HOSPITAL_BASED_OUTPATIENT_CLINIC_OR_DEPARTMENT_OTHER): Payer: Self-pay | Admitting: *Deleted

## 2023-10-03 ENCOUNTER — Encounter (HOSPITAL_BASED_OUTPATIENT_CLINIC_OR_DEPARTMENT_OTHER): Payer: Self-pay | Admitting: Family Medicine

## 2023-10-03 VITALS — BP 124/83 | HR 79 | Ht 60.0 in | Wt 195.0 lb

## 2023-10-03 DIAGNOSIS — Z7984 Long term (current) use of oral hypoglycemic drugs: Secondary | ICD-10-CM | POA: Diagnosis not present

## 2023-10-03 DIAGNOSIS — E119 Type 2 diabetes mellitus without complications: Secondary | ICD-10-CM | POA: Diagnosis not present

## 2023-10-03 DIAGNOSIS — Z1231 Encounter for screening mammogram for malignant neoplasm of breast: Secondary | ICD-10-CM | POA: Diagnosis not present

## 2023-10-03 LAB — POCT GLYCOSYLATED HEMOGLOBIN (HGB A1C)
HbA1c POC (<> result, manual entry): 6.5 % (ref 4.0–5.6)
HbA1c, POC (controlled diabetic range): 6.5 % (ref 0.0–7.0)
Hemoglobin A1C: 6.5 % — AB (ref 4.0–5.6)

## 2023-10-03 LAB — POCT UA - MICROALBUMIN
Creatinine, POC: 10 mg/dL
Microalbumin Ur, POC: 30 mg/L

## 2023-10-03 MED ORDER — TIRZEPATIDE 2.5 MG/0.5ML ~~LOC~~ SOAJ
2.5000 mg | SUBCUTANEOUS | 2 refills | Status: DC
Start: 2023-10-03 — End: 2023-10-03

## 2023-10-03 NOTE — Addendum Note (Signed)
 Addended by: Rihanna Marseille on: 10/03/2023 04:30 PM   Modules accepted: Orders

## 2023-10-03 NOTE — Patient Instructions (Addendum)
 Stop your metformin  once you pick up the new GLP-1 medication.   Ophthalmology Offices   [facility]  [address]  [place], [place] [address] [phone]  [facility] Ophthalmology [address] [place], [place] [address] Phone: [phone]  [facility] [address] [place], [place] [address] Phone: [phone]  [facility]  Optometrist [address]  [phone]  [facility] Optometrist [address]  [phone]

## 2023-10-03 NOTE — Progress Notes (Signed)
 Subjective:   Angel Rodriguez 1976/01/05 10/03/2023  Chief Complaint  Patient presents with   Medical Management of Chronic Issues    48-month follow up; pt wants to discuss possibly starting on either ozempic or mounjaro  to try to see if it can help her lose weight.    HPI: Angel Rodriguez presents today for re-assessment and management of chronic medical conditions.  DIABETES MELLITUS: Angel Rodriguez presents for the medical management of diabetes.  Current diabetes medication regimen: Metformin  1500mg  daily. Pt states she is only taking 1000mg  d/t intolerance of 1500mg  causing heartburn and nausea.  Patient is not adhering to a diabetic diet. She reports consuming Belvita cookies for breakfast, salads with breaded meat for lunch, and fruit in between meals. She reports eating dinner once or twice a week d/t being busy with work. She drinks water  daily.  Patient is not exercising regularly, states she walks when she isn't tired, but is mostly tired all the time.  Patient is checking BS regularly. Avg: 90-120  Denies polydipsia, polyphagia, polyuria, open wounds or ulcers on feet.  Lab Results  Component Value Date   HGBA1C 6.5 (A) 10/03/2023   HGBA1C 6.5 10/03/2023   HGBA1C 6.5 10/03/2023    Foot Exam: 04/12/2023 Lab Results  Component Value Date   MICROALBUR 30 10/03/2023   MICROALBUR 30 04/24/2022    Wt Readings from Last 3 Encounters:  10/03/23 195 lb (88.5 kg)  07/06/23 180 lb (81.6 kg)  05/08/23 177 lb 8 oz (80.5 kg)   HEALTH SCREENINGS:  Pt is due for mammogram and diabetic ophthalmology exam. During her last PCP visit February 2025, pt was referred to ophthalmology for diabetic eye exam, and order was placed for mammogram. Pt states she has not followed up with either, and would like new referrals/orders placed.    The following portions of the patient's history were reviewed and updated as appropriate: past medical history, past surgical history, family  history, social history, allergies, medications, and problem list.   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   Pain in both hands 12/08/2019   GAD (generalized anxiety disorder) 09/03/2018   Panic disorder 06/06/2018   Acute left-sided low back pain without sciatica 05/27/2018   Mental confusion/ slurred speech 05/09/2018   High risk medication use 05/09/2018   Major depressive disorder, single episode, moderate (HCC) 05/09/2018   Bilateral hand numbness 03/18/2018   Vitamin D  deficiency 03/18/2018   Thrombocytopenia (HCC) 03/14/2018   Dysarthria 03/14/2018   Brain lesion 03/14/2018   Right sided weakness 03/13/2018   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   Abnormal MRI of head 01/21/2016   Vascular headache 09/15/2015   Insomnia 09/15/2015   Intractable chronic migraine without aura and with status migrainosus 07/08/2015   Neck pain 06/24/2015   Seizure (HCC) 06/24/2015   Central retinal vein occlusion 12/11/2011   Cotton wool exudates 12/11/2011   Cystoid macular edema 12/11/2011   Past Medical History:  Diagnosis Date   Anxiety    Blind right eye 2009   swelling & pressure   Cluster headaches  started after seizure and migraines   Complication of anesthesia    trouble with short term memory after surgeries   COVID-19    Depression    Diastolic dysfunction    a. 01/2021 Echo: EF 60-65%, no rwma, GrII DD, nl RV fxn, mildly dil LA. Triv AI.   Fibroids    s/p TLH, bilateral salpingectomy   GERD (gastroesophageal reflux disease)     patient thinks due to medications   History of anemia    prior to hysterectomy   History of pneumonia    History of trichomoniasis    Seizures (HCC) x 1 2-3 yrs ago none since   once saw neurologist, full body brain MRI and another 6 months lster   Status post right oophorectomy    Thrombocytopenia (HCC)    sees [place] with Heme Onc   Vasculitis (HCC)    neuro   Past Surgical History:  Procedure Laterality Date   CYSTOSCOPY N/A 07/31/2017   Procedure: CYSTOSCOPY;  Surgeon: Cleotilde Ronal RAMAN, MD;  Location: [facility];  Service: Gynecology;  Laterality: N/A;   ENDOMETRIAL ABLATION     EYE SURGERY     laser - removed blood from right eye   HYSTEROSCOPY WITH D & C     LAPAROSCOPY N/A 09/19/2017   Procedure: LAPAROSCOPY DIAGNOSTIC WITH RIGHT OOPHERECTOMY, LYSIS OF ADHESIONS;  Surgeon: Jertson, Jill Evelyn, MD;  Location: [facility];  Service: Gynecology;  Laterality: N/A;   OMENTECTOMY N/A 11/21/2017   Procedure: OMENTECTOMY AND PELVIC WASHINGS;  Surgeon: Anitra Freddy NOVAK, MD;  Location: [facility];  Service: Gynecology;  Laterality: N/A;   ROBOTIC ASSISTED SALPINGO OOPHERECTOMY Left 11/21/2017   Procedure: XI ROBOTIC ASSISTED LEFT SALPINGO OOPHORECTOMY;  Surgeon: Anitra Freddy NOVAK, MD;  Location: [facility];  Service: Gynecology;  Laterality: Left;   TOTAL LAPAROSCOPIC HYSTERECTOMY WITH SALPINGECTOMY Bilateral 07/31/2017   Procedure: TOTAL LAPAROSCOPIC HYSTERECTOMY WITH SALPINGECTOMY;  Surgeon: Cleotilde Ronal RAMAN, MD;  Location: [facility];  Service: Gynecology;  Laterality: Bilateral;  20 week size uterus/ Ninette Cotta bag in room/ need 4.5 hours   TUBAL LIGATION     WISDOM TOOTH EXTRACTION     Family History  Problem Relation Age of Onset   Sleep apnea Mother    Alcoholism Father    Cirrhosis Father    Alcohol abuse Father    Cancer Maternal Grandmother        unsure of type; died of other causes   Heart disease Maternal Aunt    Drug abuse Cousin    Outpatient Medications Prior to Visit  Medication Sig  Dispense Refill   Accu-Chek Softclix Lancets lancets Use as instructed 100 each 12   acetaminophen -codeine  (TYLENOL  #3) 300-30 MG tablet TAKE ONE TABLET BY MOUTH UP TO FOUR TIMES A DAY AS NEEDED 28 tablet 2   Ascorbic Acid (VITAMIN C) 1000 MG tablet Take 1,000 mg by mouth daily.     atorvastatin  (LIPITOR) 80 MG tablet Take 1 tablet (80 mg total) by mouth daily. 30 tablet 11   atropine  1 % ophthalmic solution One drop into affected eye up to tid 5 mL 3   b complex vitamins capsule Take 1 capsule by mouth daily.     Blood Glucose Monitoring Suppl (ACCU-CHEK GUIDE ME) w/Device KIT USE TO CHECK FASTING BLOOD SUGAR AND 2 HOURS AFTER LARGEST MEAL 1 kit 0   buPROPion  (WELLBUTRIN  XL) 150 MG 24 hr tablet Take 1 tablet (150 mg total) by mouth daily. 90 tablet 1   clonazePAM  (KLONOPIN )  1 MG tablet TAKE 1 TABLET BY MOUTH TWICE A DAY 60 tablet 5   dorzolamide -timolol  (COSOPT ) 22.3-6.8 MG/ML ophthalmic solution Place 1 drop into the right eye 2 (two) times daily.      ezetimibe  (ZETIA ) 10 MG tablet Take 1 tablet (10 mg total) by mouth daily. 90 tablet 3   fluticasone  (FLONASE ) 50 MCG/ACT nasal spray USE 1 -2 SPRAYS INTO BOTH NOSTRILS DAILY AS NEEDED FOR ALLERGIES 48 mL 1   glucose blood (ACCU-CHEK GUIDE) test strip Use to check fasting glucose and 2 hours after largest meal 100 each 12   Lacosamide  100 MG TABS Take 1 tablet (100 mg total) by mouth 2 (two) times daily. 180 tablet 1   latanoprost  (XALATAN ) 0.005 % ophthalmic solution Place 1 drop into both eyes at bedtime.     metFORMIN  (GLUCOPHAGE ) 500 MG tablet Take 2 tablets (1000mg )  by mouth in the morning and 1 tablet (500mg ) by mouth in the evening. 180 tablet 3   ondansetron  (ZOFRAN -ODT) 4 MG disintegrating tablet Take 1 tablet (4 mg total) by mouth every 8 (eight) hours as needed for nausea or vomiting. 20 tablet 11   pilocarpine  (SALAGEN ) 5 MG tablet Take 1 tablet (5 mg total) by mouth 2 (two) times daily. 60 tablet 11   RUXIENCE  500 MG/50ML injection  1000mg  IV once every 24 weeks 100 mL 1   SUMAtriptan  (IMITREX ) 100 MG tablet Take 1 tablet (100 mg total) by mouth once as needed for migraine. May repeat in 2 hours if headache persists or recurs. 9 tablet 5   verapamil  (CALAN -SR) 240 MG CR tablet Take 1 tablet (240 mg total) by mouth at bedtime. 90 tablet 2   vortioxetine  HBr (TRINTELLIX ) 20 MG TABS tablet Take 1 tablet (20 mg total) by mouth daily. 30 tablet 1   calcium  carbonate (TUMS EX) 750 MG chewable tablet Chew 2 tablets by mouth daily as needed for heartburn.      moxifloxacin (VIGAMOX) 0.5 % ophthalmic solution Place into both eyes 3 (three) times daily.     vortioxetine  HBr (TRINTELLIX ) 10 MG TABS tablet Take 1 tablet (10 mg total) by mouth daily. 28 tablet 0   nystatin  cream (MYCOSTATIN ) Apply 1 application topically 2 (two) times daily. Apply to affected area BID for up to 7 days. (Patient not taking: Reported on 10/03/2023) 30 g 1   Facility-Administered Medications Prior to Visit  Medication Dose Route Frequency Provider Last Rate Last Admin   bupivacaine (PF) (MARCAINE ) 0.5 % injection 2 mL  2 mL Other Once Sater, Charlie LABOR, MD       lidocaine  (XYLOCAINE ) 2 % (with pres) injection 40 mg  2 mL Intradermal Once Sater, Charlie LABOR, MD       Allergies  Allergen Reactions   Ivp Dye [Iodinated Contrast Media] Hives, Itching and Swelling   Bee Venom Swelling   Buspar  [Buspirone ] Other (See Comments)   ROS: A complete ROS was performed with pertinent positives/negatives noted in the HPI. The remainder of the ROS are negative.    Objective:   Today's Vitals   10/03/23 0855  BP: 124/83  Pulse: 79  SpO2: 99%  Weight: 195 lb (88.5 kg)  Height: 5' (1.524 m)    GENERAL: Well-appearing, in NAD. Well nourished.  SKIN: Pink, warm and dry. No rash, lesion, ulceration, or ecchymoses.  Head: Normocephalic. NECK: Trachea midline. Full ROM.  THROAT: Mucous membranes pink and moist.  RESPIRATORY: Chest wall symmetrical. Respirations even  and non-labored. Breath sounds clear to  auscultation bilaterally.  CARDIAC: S1, S2 present, regular rate and rhythm without murmur or gallops. Peripheral pulses 2+ bilaterally.  MSK: Muscle tone and strength appropriate for age. Joints w/o tenderness, redness, or swelling.  EXTREMITIES: Without clubbing, cyanosis, or edema.  NEUROLOGIC: No motor or sensory deficits. Steady, even gait. C2-C12 intact.  PSYCH/MENTAL STATUS: Alert, oriented x 3. Cooperative, tearful.   Health Maintenance Due  Topic Date Due   OPHTHALMOLOGY EXAM  Never done   Hepatitis B Vaccines (2 of 3 - 19+ 3-dose series) 09/18/2015   MAMMOGRAM  11/05/2022   COVID-19 Vaccine (5 - 2024-25 season) 11/05/2022    Results for orders placed or performed in visit on 10/03/23  POCT HgB A1C  Result Value Ref Range   Hemoglobin A1C 6.5 (A) 4.0 - 5.6 %   HbA1c POC (<> result, manual entry) 6.5 4.0 - 5.6 %   HbA1c, POC (prediabetic range)     HbA1c, POC (controlled diabetic range) 6.5 0.0 - 7.0 %  POCT UA - Microalbumin  Result Value Ref Range   Microalbumin Ur, POC 30 mg/L   Creatinine, POC 10 mg/dL   Albumin/Creatinine Ratio, Urine, POC 30-300     The 10-year ASCVD risk score (Arnett DK, et al., 2019) is: 2.4%     Assessment & Plan:  1. Type 2 diabetes mellitus without complication, without long-term current use of insulin (HCC) (Primary) Uncontrolled. POC A1c and urine microalbumin collected today. A1c has increased from previous. Long discussion with pt regarding nutrition and exercise, including low carb, low sugar foods, grilled versus breaded/fried meats, and limiting fruits as they are naturally high in sugar. Pt expressed her frustration with weight gain, stating she wants to try a weekly GLP-1 injectable. Discussed most common side effects, including nausea and constipation. No hx of medullary thyroid cancer or MEN2. No hx of pancreatitis. Mounjaro  Rx sent to pt's pharmacy; pt made aware that if it is approved by  insurance, she will be started at 2.5mg  per week. She is encouraged to start following a low carb, low sugar, high fiber diet, and exercise daily. Pt verbalized plan and stated she is tired of feeling this way and is ready to make some changes. She was also given information regarding ophthalmologists in the area to follow-up with for diabetic eye exam.  - POCT HgB A1C - POCT UA - Microalbumin  2. Breast cancer screening by mammogram Standing order placed for annual mammogram.    Meds ordered this encounter  Medications   DISCONTD: tirzepatide  (MOUNJARO ) 2.5 MG/0.5ML Pen    Sig: Inject 2.5 mg into the skin once a week.    Dispense:  2 mL    Refill:  2    Supervising Provider:   DE PERU, RAYMOND J [id number]   Lab Orders         POCT HgB A1C         POCT UA - Microalbumin     No images are attached to the encounter or orders placed in the encounter.  Return in about 3 months (around 01/03/2024) for DIABETES CHECK UP.    Patient to reach out to office if new, worrisome, or unresolved symptoms arise or if no improvement in patient's condition. Patient verbalized understanding and is agreeable to treatment plan. All questions answered to patient's satisfaction.   Thersia Stark, FNP-C

## 2023-10-03 NOTE — Telephone Encounter (Signed)
 Changes Requested   liraglutide (VICTOZA) 18 MG/3ML SOPN       Changed from: tirzepatide  (MOUNJARO ) 2.5 MG/0.5ML Pen   All pharmacy suggested alternatives are listed below   Sig: N/A   Disp: Not specified    Refills: 0   Start: 10/03/2023   Class: Normal   Non-formulary   Last ordered: Today (10/03/2023) by Thersia Schuyler Stark, FNP   Last refill: 10/03/2023   Rx #: [id number]   Pharmacy comment: Alternative Requested:THE PRESCRIBED MEDICATION IS NOT COVERED BY INSURANCE. PLEASE CONSIDER CHANGING TO ONE OF THE SUGGESTED COVERED ALTERNATIVES.  All Pharmacy Suggested Alternatives:  liraglutide (VICTOZA) 18 MG/3ML SOPN Dulaglutide (TRULICITY) 0.75 MG/0.5ML SOAJ  To prescribe one of the alternatives listed above, open the encounter and click Replace.  Open Encounter     To be filled at: [id number] - [place], [place] - [address]

## 2023-10-03 NOTE — Telephone Encounter (Signed)
-----   Message from Thersia Bitters Caudle sent at 10/03/2023  1:43 PM EDT ----- Regarding: Victoza Please let patient know that Mounjaro , ozempic is not covered under her plan. I have sent in Victoza instead which is a daily injectable GLP1. She will start with 0.6mg  daily for 7 days and then increase to 1.2 mg daily.

## 2023-10-03 NOTE — Progress Notes (Signed)
 Subjective:   Angel Rodriguez 1975-11-13 10/03/2023  Chief Complaint  Patient presents with   Medical Management of Chronic Issues    47-month follow up; pt wants to discuss possibly starting on either ozempic or mounjaro  to try to see if it can help her lose weight.    HPI: Angel Rodriguez presents today for re-assessment and management of chronic medical conditions.  DIABETES MELLITUS: Angel Rodriguez presents for the medical management of diabetes.  Current diabetes medication regimen: Metformin  1500mg  daily. Pt states she is only taking 1000mg  d/t intolerance of 1500mg  causing heartburn and nausea.  Patient is not adhering to a diabetic diet. She reports consuming Belvita cookies for breakfast, salads with breaded meat for lunch, and fruit in between meals. She reports eating dinner once or twice a week d/t being busy with work. She drinks water  daily.  Patient is not exercising regularly, states she walks when she isn't tired, but is mostly tired all the time.  Patient is checking BS regularly. Avg: 90-120  Denies polydipsia, polyphagia, polyuria, open wounds or ulcers on feet.  Lab Results  Component Value Date   HGBA1C 6.5 (A) 10/03/2023   HGBA1C 6.5 10/03/2023   HGBA1C 6.5 10/03/2023    Foot Exam: 04/12/2023 Lab Results  Component Value Date   MICROALBUR 30 10/03/2023   MICROALBUR 30 04/24/2022    Wt Readings from Last 3 Encounters:  10/03/23 88.5 kg  07/06/23 81.6 kg  05/08/23 80.5 kg   HEALTH SCREENINGS:  Pt is due for mammogram and diabetic ophthalmology exam. During her last PCP visit February 2025, pt was referred to ophthalmology for diabetic eye exam, and order was placed for mammogram. Pt states she has not followed up with either, and would like new referrals/orders placed.    The following portions of the patient's history were reviewed and updated as appropriate: past medical history, past surgical history, family history, social history, allergies,  medications, and problem list.   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   Pain in both hands 12/08/2019   GAD (generalized anxiety disorder) 09/03/2018   Panic disorder 06/06/2018   Acute left-sided low back pain without sciatica 05/27/2018   Mental confusion/ slurred speech 05/09/2018   High risk medication use 05/09/2018   Major depressive disorder, single episode, moderate (HCC) 05/09/2018   Bilateral hand numbness 03/18/2018   Vitamin D  deficiency 03/18/2018   Thrombocytopenia (HCC) 03/14/2018   Dysarthria 03/14/2018   Brain lesion 03/14/2018   Right sided weakness 03/13/2018   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   Abnormal MRI of head 01/21/2016   Vascular headache 09/15/2015   Insomnia 09/15/2015   Intractable chronic migraine without aura and with status migrainosus 07/08/2015   Neck pain 06/24/2015   Seizure (HCC) 06/24/2015   Central retinal vein occlusion 12/11/2011   Cotton wool exudates 12/11/2011   Cystoid macular edema 12/11/2011   Past Medical History:  Diagnosis Date   Anxiety    Blind right eye 2009   swelling & pressure   Cluster headaches    started after seizure and migraines  Complication of anesthesia    trouble with short term memory after surgeries   COVID-19    Depression    Diastolic dysfunction    a. 01/2021 Echo: EF 60-65%, no rwma, GrII DD, nl RV fxn, mildly dil LA. Triv AI.   Fibroids    s/p TLH, bilateral salpingectomy   GERD (gastroesophageal reflux disease)    patient thinks due to medications    History of anemia    prior to hysterectomy   History of pneumonia    History of trichomoniasis    Seizures (HCC) x 1 2-3 yrs ago none since   once saw neurologist, full body brain MRI and another 6 months lster   Status post right oophorectomy    Thrombocytopenia (HCC)    sees [place] with Heme Onc   Vasculitis (HCC)    neuro   Past Surgical History:  Procedure Laterality Date   CYSTOSCOPY N/A 07/31/2017   Procedure: CYSTOSCOPY;  Surgeon: Cleotilde Ronal RAMAN, MD;  Location: [facility];  Service: Gynecology;  Laterality: N/A;   ENDOMETRIAL ABLATION     EYE SURGERY     laser - removed blood from right eye   HYSTEROSCOPY WITH D & C     LAPAROSCOPY N/A 09/19/2017   Procedure: LAPAROSCOPY DIAGNOSTIC WITH RIGHT OOPHERECTOMY, LYSIS OF ADHESIONS;  Surgeon: Jertson, Jill Evelyn, MD;  Location: [facility];  Service: Gynecology;  Laterality: N/A;   OMENTECTOMY N/A 11/21/2017   Procedure: OMENTECTOMY AND PELVIC WASHINGS;  Surgeon: Anitra Freddy NOVAK, MD;  Location: [facility];  Service: Gynecology;  Laterality: N/A;   ROBOTIC ASSISTED SALPINGO OOPHERECTOMY Left 11/21/2017   Procedure: XI ROBOTIC ASSISTED LEFT SALPINGO OOPHORECTOMY;  Surgeon: Anitra Freddy NOVAK, MD;  Location: [facility];  Service: Gynecology;  Laterality: Left;   TOTAL LAPAROSCOPIC HYSTERECTOMY WITH SALPINGECTOMY Bilateral 07/31/2017   Procedure: TOTAL LAPAROSCOPIC HYSTERECTOMY WITH SALPINGECTOMY;  Surgeon: Cleotilde Ronal RAMAN, MD;  Location: [facility];  Service: Gynecology;  Laterality: Bilateral;  20 week size uterus/ Alexis bag in room/ need 4.5 hours   TUBAL LIGATION     WISDOM TOOTH EXTRACTION     Family History  Problem Relation Age of Onset   Sleep apnea Mother    Alcoholism Father    Cirrhosis Father    Alcohol abuse Father    Cancer Maternal Grandmother        unsure of type; died of other causes   Heart disease Maternal Aunt    Drug abuse Cousin    Outpatient Medications Prior to Visit  Medication Sig Dispense Refill   Accu-Chek Softclix  Lancets lancets Use as instructed 100 each 12   acetaminophen -codeine  (TYLENOL  #3) 300-30 MG tablet TAKE ONE TABLET BY MOUTH UP TO FOUR TIMES A DAY AS NEEDED 28 tablet 2   Ascorbic Acid (VITAMIN C) 1000 MG tablet Take 1,000 mg by mouth daily.     atorvastatin  (LIPITOR) 80 MG tablet Take 1 tablet (80 mg total) by mouth daily. 30 tablet 11   atropine  1 % ophthalmic solution One drop into affected eye up to tid 5 mL 3   b complex vitamins capsule Take 1 capsule by mouth daily.     Blood Glucose Monitoring Suppl (ACCU-CHEK GUIDE ME) w/Device KIT USE TO CHECK FASTING BLOOD SUGAR AND 2 HOURS AFTER LARGEST MEAL 1 kit 0   buPROPion  (WELLBUTRIN  XL) 150 MG 24 hr tablet Take 1 tablet (150 mg total) by mouth daily. 90 tablet 1   clonazePAM  (KLONOPIN ) 1 MG tablet TAKE 1 TABLET BY  MOUTH TWICE A DAY 60 tablet 5   dorzolamide -timolol  (COSOPT ) 22.3-6.8 MG/ML ophthalmic solution Place 1 drop into the right eye 2 (two) times daily.      ezetimibe  (ZETIA ) 10 MG tablet Take 1 tablet (10 mg total) by mouth daily. 90 tablet 3   fluticasone  (FLONASE ) 50 MCG/ACT nasal spray USE 1 -2 SPRAYS INTO BOTH NOSTRILS DAILY AS NEEDED FOR ALLERGIES 48 mL 1   glucose blood (ACCU-CHEK GUIDE) test strip Use to check fasting glucose and 2 hours after largest meal 100 each 12   Lacosamide  100 MG TABS Take 1 tablet (100 mg total) by mouth 2 (two) times daily. 180 tablet 1   latanoprost  (XALATAN ) 0.005 % ophthalmic solution Place 1 drop into both eyes at bedtime.     metFORMIN  (GLUCOPHAGE ) 500 MG tablet Take 2 tablets (1000mg )  by mouth in the morning and 1 tablet (500mg ) by mouth in the evening. 180 tablet 3   ondansetron  (ZOFRAN -ODT) 4 MG disintegrating tablet Take 1 tablet (4 mg total) by mouth every 8 (eight) hours as needed for nausea or vomiting. 20 tablet 11   pilocarpine  (SALAGEN ) 5 MG tablet Take 1 tablet (5 mg total) by mouth 2 (two) times daily. 60 tablet 11   RUXIENCE  500 MG/50ML injection 1000mg  IV once every 24 weeks 100 mL 1    SUMAtriptan  (IMITREX ) 100 MG tablet Take 1 tablet (100 mg total) by mouth once as needed for migraine. May repeat in 2 hours if headache persists or recurs. 9 tablet 5   verapamil  (CALAN -SR) 240 MG CR tablet Take 1 tablet (240 mg total) by mouth at bedtime. 90 tablet 2   vortioxetine  HBr (TRINTELLIX ) 20 MG TABS tablet Take 1 tablet (20 mg total) by mouth daily. 30 tablet 1   calcium  carbonate (TUMS EX) 750 MG chewable tablet Chew 2 tablets by mouth daily as needed for heartburn.      moxifloxacin (VIGAMOX) 0.5 % ophthalmic solution Place into both eyes 3 (three) times daily.     vortioxetine  HBr (TRINTELLIX ) 10 MG TABS tablet Take 1 tablet (10 mg total) by mouth daily. 28 tablet 0   nystatin  cream (MYCOSTATIN ) Apply 1 application topically 2 (two) times daily. Apply to affected area BID for up to 7 days. (Patient not taking: Reported on 10/03/2023) 30 g 1   Facility-Administered Medications Prior to Visit  Medication Dose Route Frequency Provider Last Rate Last Admin   bupivacaine (PF) (MARCAINE ) 0.5 % injection 2 mL  2 mL Other Once Sater, Charlie LABOR, MD       lidocaine  (XYLOCAINE ) 2 % (with pres) injection 40 mg  2 mL Intradermal Once Sater, Charlie LABOR, MD       Allergies  Allergen Reactions   Ivp Dye [Iodinated Contrast Media] Hives, Itching and Swelling   Bee Venom Swelling   Buspar  [Buspirone ] Other (See Comments)   ROS: A complete ROS was performed with pertinent positives/negatives noted in the HPI. The remainder of the ROS are negative.    Objective:   Today's Vitals   10/03/23 0855  BP: 124/83  Pulse: 79  SpO2: 99%  Weight: 88.5 kg  Height: 5' (1.524 m)    GENERAL: Well-appearing, in NAD. Well nourished.  SKIN: Pink, warm and dry. No rash, lesion, ulceration, or ecchymoses.  Head: Normocephalic. NECK: Trachea midline. Full ROM.  THROAT: Mucous membranes pink and moist.  RESPIRATORY: Chest wall symmetrical. Respirations even and non-labored. Breath sounds clear to  auscultation bilaterally.  CARDIAC: S1, S2 present, regular rate  and rhythm without murmur or gallops. Peripheral pulses 2+ bilaterally.  MSK: Muscle tone and strength appropriate for age. Joints w/o tenderness, redness, or swelling.  EXTREMITIES: Without clubbing, cyanosis, or edema.  NEUROLOGIC: No motor or sensory deficits. Steady, even gait. C2-C12 intact.  PSYCH/MENTAL STATUS: Alert, oriented x 3. Cooperative, tearful.   Health Maintenance Due  Topic Date Due   OPHTHALMOLOGY EXAM  Never done   Hepatitis B Vaccines (2 of 3 - 19+ 3-dose series) 09/18/2015   MAMMOGRAM  11/05/2022   COVID-19 Vaccine (5 - 2024-25 season) 11/05/2022    Results for orders placed or performed in visit on 10/03/23  POCT HgB A1C  Result Value Ref Range   Hemoglobin A1C 6.5 (A) 4.0 - 5.6 %   HbA1c POC (<> result, manual entry) 6.5 4.0 - 5.6 %   HbA1c, POC (prediabetic range)     HbA1c, POC (controlled diabetic range) 6.5 0.0 - 7.0 %  POCT UA - Microalbumin  Result Value Ref Range   Microalbumin Ur, POC 30 mg/L   Creatinine, POC 10 mg/dL   Albumin/Creatinine Ratio, Urine, POC 30-300     The 10-year ASCVD risk score (Arnett DK, et al., 2019) is: 2.4%     Assessment & Plan:  1. Type 2 diabetes mellitus without complication, without long-term current use of insulin (HCC) (Primary) Uncontrolled. POC A1c and urine microalbumin collected today. A1c has increased from previous. Long discussion with pt regarding nutrition and exercise, including low carb, low sugar foods, grilled versus breaded/fried meats, and limiting fruits as they are naturally high in sugar. Pt expressed her frustration with weight gain, stating she wants to try a weekly GLP-1 injectable. Discussed most common side effects, including nausea and constipation. Mounjaro  Rx sent to pt's pharmacy; pt made aware that if it is approved by insurance, she will be started at 2.5mg  per week. She is encouraged to start following a low carb, low sugar,  high fiber diet, and exercise daily. Pt verbalized plan and stated she is tired of feeling this way and is ready to make some changes. She was also given information regarding ophthalmologists in the area to follow-up with for diabetic eye exam.  - POCT HgB A1C - POCT UA - Microalbumin  2. Breast cancer screening by mammogram Standing order placed for annual mammogram.    Type 2 diabetes mellitus without complication, without long-term current use of insulin (HCC) -     POCT glycosylated hemoglobin (Hb A1C) -     POCT UA - Microalbumin  Breast cancer screening by mammogram    Meds ordered this encounter  Medications   DISCONTD: tirzepatide  (MOUNJARO ) 2.5 MG/0.5ML Pen    Sig: Inject 2.5 mg into the skin once a week.    Dispense:  2 mL    Refill:  2    Supervising Provider:   DE PERU, RAYMOND J [id number]   Lab Orders         POCT HgB A1C         POCT UA - Microalbumin     No images are attached to the encounter or orders placed in the encounter.  Return in about 3 months (around 01/03/2024) for DIABETES CHECK UP.    Patient to reach out to office if new, worrisome, or unresolved symptoms arise or if no improvement in patient's condition. Patient verbalized understanding and is agreeable to treatment plan. All questions answered to patient's satisfaction.   Treatment plan and recommendation(s) reviewed by supervising preceptor, Thersia CLEMENTEEN Stark,  FNP-C, prior to clinic discharge.   Rosina Ada, BSN, RN  DNP Student

## 2023-10-10 ENCOUNTER — Other Ambulatory Visit: Payer: Self-pay | Admitting: Neurology

## 2023-10-11 NOTE — Telephone Encounter (Signed)
 Last seen on 05/08/23 Follow up scheduled on 11/06/23   It looks like [facility] was originally prescribed this medication.  However it appear your name has been signed for Rx.   Rx pending to approve or deny

## 2023-10-13 ENCOUNTER — Other Ambulatory Visit: Payer: Self-pay | Admitting: Nurse Practitioner

## 2023-10-13 DIAGNOSIS — E119 Type 2 diabetes mellitus without complications: Secondary | ICD-10-CM

## 2023-10-15 ENCOUNTER — Other Ambulatory Visit (HOSPITAL_BASED_OUTPATIENT_CLINIC_OR_DEPARTMENT_OTHER): Payer: Self-pay | Admitting: Family Medicine

## 2023-10-15 DIAGNOSIS — E119 Type 2 diabetes mellitus without complications: Secondary | ICD-10-CM

## 2023-10-15 MED ORDER — METFORMIN HCL 500 MG PO TABS: ORAL_TABLET | ORAL | 3 refills | Status: DC

## 2023-10-22 ENCOUNTER — Telehealth (HOSPITAL_BASED_OUTPATIENT_CLINIC_OR_DEPARTMENT_OTHER): Payer: Self-pay | Admitting: *Deleted

## 2023-10-22 ENCOUNTER — Other Ambulatory Visit (HOSPITAL_BASED_OUTPATIENT_CLINIC_OR_DEPARTMENT_OTHER): Payer: Self-pay | Admitting: *Deleted

## 2023-10-22 MED ORDER — PEN NEEDLES 32G X 4 MM MISC: 2 refills | Status: AC

## 2023-10-22 NOTE — Telephone Encounter (Signed)
 Copied from CRM #[id number]. Topic: Clinical - Prescription Issue >> Oct 22, 2023  2:55 PM DeAngela L wrote: Reason for CRM: patient states she picked up prescription for liraglutide (VICTOZA) 18 MG/3ML SOPN And it contained Pens and NO needles and the patient is calling to ask if the office could place an order with the pharmacy for the needles so she can pick them up today The pharmacy did not give her any needles and she can pay for some, or she can call the office and have them submit a rx for the needles  Also pt has a question now that she did not have the needles today  should she start her medication tonight and still take it again in the morning or should she just start tomorrow?  Pt num [phone] (M)  [id number] [place], [place] - [address] [place] [address] Phone: [phone] Fax: [phone]

## 2023-10-22 NOTE — Telephone Encounter (Signed)
 Pen needles sent to pharmacy. Please advise if patient should start medication today.

## 2023-10-23 ENCOUNTER — Encounter (HOSPITAL_BASED_OUTPATIENT_CLINIC_OR_DEPARTMENT_OTHER): Payer: Self-pay | Admitting: Family Medicine

## 2023-10-26 ENCOUNTER — Other Ambulatory Visit: Payer: Self-pay | Admitting: Neurology

## 2023-10-30 NOTE — Telephone Encounter (Signed)
 Last seen on 05/08/23 Follow up scheduled on 11/06/23   Dispensed Days Supply Quantity Provider Pharmacy  LACOSAMIDE  100 MG TABLET 09/27/2023 30 60 each Sater, Charlie LABOR, MD [facility] [phone] [facility]      Rx pending to be signed

## 2023-11-06 ENCOUNTER — Encounter: Payer: Self-pay | Admitting: Neurology

## 2023-11-06 ENCOUNTER — Ambulatory Visit: Admitting: Neurology

## 2023-11-06 VITALS — BP 131/87 | HR 84 | Ht 60.0 in | Wt 196.0 lb

## 2023-11-06 DIAGNOSIS — Z79899 Other long term (current) drug therapy: Secondary | ICD-10-CM

## 2023-11-06 DIAGNOSIS — I776 Arteritis, unspecified: Secondary | ICD-10-CM | POA: Diagnosis not present

## 2023-11-06 DIAGNOSIS — R569 Unspecified convulsions: Secondary | ICD-10-CM | POA: Diagnosis not present

## 2023-11-06 DIAGNOSIS — G43109 Migraine with aura, not intractable, without status migrainosus: Secondary | ICD-10-CM

## 2023-11-06 DIAGNOSIS — F419 Anxiety disorder, unspecified: Secondary | ICD-10-CM

## 2023-11-06 DIAGNOSIS — G4734 Idiopathic sleep related nonobstructive alveolar hypoventilation: Secondary | ICD-10-CM | POA: Diagnosis not present

## 2023-11-06 DIAGNOSIS — G441 Vascular headache, not elsewhere classified: Secondary | ICD-10-CM | POA: Diagnosis not present

## 2023-11-06 MED ORDER — UBRELVY 100 MG PO TABS: ORAL_TABLET | ORAL | 11 refills | Status: AC

## 2023-11-06 MED ORDER — METHYLPREDNISOLONE ACETATE 80 MG/ML IJ SUSP
80.0000 mg | Freq: Once | INTRAMUSCULAR | Status: AC
  Administered 2023-11-06: 80 mg via INTRAMUSCULAR

## 2023-11-06 NOTE — Progress Notes (Addendum)
 [facility]  PATIENT: Angel Rodriguez DOB: [birth year]  REFERRING DOCTOR OR PCP:   SOURCE: patient, ED records, images on PACS, reports in EMR  _________________________________   HISTORICAL  CHIEF COMPLAINT:  Chief Complaint  Patient presents with   RM11/CNS    Pt is here Alone. Pt states she would like to discuss treatment.     HISTORY OF PRESENT ILLNESS:  Angel Rodriguez is a 48 y.o. woman with frequent headaches and h/o seizure.  Update 11/06/2023: She is on Ruxience  (rituximab ) for CNS vasculitis. Last infusion was June 2024.   She felt she had some reaction to that one and needed the rate slowed and took more Benadryl .  She slept a lot the next day   She has done well and has less eye pain/pressure.       I have reviewed the MRI of the brain from 06/22/2022. It shows 2 T2/FLAIR hyperintense foci in the left frontal lobe, both in the subcortical region. These are consistent with her remote strokes. There were no new findings. No enhancing lesions. She also appears to have a small left frontal arachnoid cyst that would be asymptomatic   Over the last month she has had more headaches.  These are severe and are worse during the night.  She feels pressure in the eyes, right more than left, and pain on the right side of her face.  Sometimes she has visua chanes.   Right eye is often red.  She is having frequent migraines (12-16 a month for > 4 hrs a day) .  These are right-sided headaches associated with scleral changes in the eye have worsened, sphenopalatine ganglion blocks have been very helpful with complete elimination of the pain for many months.  Trigger point injections have helped which she has neck pain as well.  Sumatriptan  helps sometimes  She has not tried an anti-CGRP agent and I will give her some samples.  She felt much better on Nurtec.    She denies any visual changes.   She has numbness in hr hands but no weakness.  The numbness is worse on the right.    It is in the palms more than dorsum.  Shaking her arms help.       She has no recent seizure.   First one was 2016 and she had several when med's were withdrawn so was restarted.  Her last one was April 2021.  She is on Vimpat .    Her depression is doing worse.  She was on Trintellix  and clonazepam  for anxiety and depression and felt better than she has done on the more recent medications (Aomoxetine).  . Insurance has not approved Trintellix  for her..   I have recommended nefazodone.  Her psychiatrist also was considering trazodone.  She has not tried either of these.  She has no OSA on PSG 10/20/2020   The study found nocturnal hypoxemia and frequent bigeminy/trigeminy.    Nocturia O2 was started and she feels better.   She saw pulmonary.    PFTs were normal.      She saw cardiology.   Echo showed mild diastoic dysfunction.    Placed on a statin.       PSG 10/20/2021: No significant OSA was noted 44% of the night was with SaO2 80-89%     ---- nocturnal O2 recommended. 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  She had a second opinion from [place].  Changes in her seizure medications were made but no opinion about the possibility of vasculitis.  She was placed on prednisone  60 mg and titrating down in July 2021.  She had improvement of headaches and other symptoms.  MRI 10/15/2019 showed:   Resolution of a region of T2 and FLAIR signal within the posterior frontal cortical and subcortical brain on the study of May. One could question slight increased prominence of a T2 and FLAIR focus without enhancement in the subcortical brain at the bottom of a left frontal sulcus, axial FLAIR image 19. No other new or potentially progressive finding. Minimal small foci of  cortical and subcortical enhancement in the left frontal region. I think the differential diagnosis for this case remains that of a vasculitis syndrome, venous thrombotic syndrome, autoimmune syndrome or demyelinating syndrome. I do not think we are dealing with tumor or arterial infarctions.   MRI brain 07/06/2018 personally reviewed and compared to studies from January 2020.  It shows 2 foci in the anterior left frontal lobe.  One was mildly increased in size compared to the January MRI and one was decreased in size.  Another focus seen on the previous MRI in the left parasagittal frontal lobe has resolved.  MRI of the cervical and thoracic spine 03/25/2018 showed normal spinal cord and mild cervical degenerative changes.  MRI of the brain from 06/22/2022. It shows 2 T2/FLAIR hyperintense foci in the left frontal lobe, both in the subcortical region. These are consistent with her remote strokes. There were no new findings. No enhancing lesions. She also appears to have a small left frontal arachnoid cyst that would be asymptomatic.   Labs: CSF 03/25/2018 showed 10 white blood cells (lymphocytes), 3 red cells, normal glucose, protein oligoclonal bands, VDRL.  In 03/2018, negative ANA, cANCA and pANCA, weakly positive ANCA proteinase 3, normal ACE level. ESR 10.  HIV was negative 09/22/2017.  Hepatitis C was negative 04/2016     REVIEW OF SYSTEMS: Constitutional: No fevers, chills, sweats, or change in appetite.  She has insomnia. Eyes: as above Ear, nose and throat: No hearing loss, ear pain, nasal congestion, sore throat Cardiovascular: No chest pain, palpitations Respiratory:  No shortness of breath at rest or with exertion.   No wheezes GastrointestinaI: No nausea, vomiting, diarrhea, abdominal pain, fecal incontinence Genitourinary:  No dysuria, urinary retention or frequency.  No nocturia.   Possible ovarian cancer Musculoskeletal:  No neck pain, back pain Integumentary: No rash, pruritus, skin  lesions Neurological: as above Psychiatric: No depression at this time.  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 Allergic/Immunologic: No itchy/runny eyes, nasal congestion, recent allergic reactions, rashes  ALLERGIES: Allergies  Allergen Reactions   Ivp Dye [Iodinated Contrast Media] Hives, Itching and Swelling   Bee Venom Swelling   Buspar  [Buspirone ] Other (See Comments)    HOME MEDICATIONS:  Current Outpatient Medications:    Accu-Chek Softclix Lancets lancets, Use as instructed, Disp: 100 each, Rfl: 12   acetaminophen -codeine  (TYLENOL  #3) 300-30 MG tablet, TAKE ONE TABLET BY MOUTH UP TO FOUR TIMES A DAY AS NEEDED, Disp: 28 tablet, Rfl: 2   Ascorbic Acid (VITAMIN C) 1000 MG tablet, Take 1,000 mg  by mouth daily., Disp: , Rfl:    atorvastatin  (LIPITOR) 80 MG tablet, Take 1 tablet (80 mg total) by mouth daily., Disp: 30 tablet, Rfl: 11   atropine  1 % ophthalmic solution, INSTILL 1 DROP INTO AFFECTED EYE UP TO 3 TIMES DAILY, Disp: 5 mL, Rfl: 3   b complex vitamins capsule, Take 1 capsule by mouth daily., Disp: , Rfl:    Blood Glucose Monitoring Suppl (ACCU-CHEK GUIDE ME) w/Device KIT, USE TO CHECK FASTING BLOOD SUGAR AND 2 HOURS AFTER LARGEST MEAL, Disp: 1 kit, Rfl: 0   buPROPion  (WELLBUTRIN  XL) 150 MG 24 hr tablet, Take 1 tablet (150 mg total) by mouth daily., Disp: 90 tablet, Rfl: 1   clonazePAM  (KLONOPIN ) 1 MG tablet, TAKE 1 TABLET BY MOUTH TWICE A DAY, Disp: 60 tablet, Rfl: 5   dorzolamide -timolol  (COSOPT ) 22.3-6.8 MG/ML ophthalmic solution, Place 1 drop into the right eye 2 (two) times daily. , Disp: , Rfl:    ezetimibe  (ZETIA ) 10 MG tablet, Take 1 tablet (10 mg total) by mouth daily., Disp: 90 tablet, Rfl: 3   fluticasone  (FLONASE ) 50 MCG/ACT nasal spray, USE 1 -2 SPRAYS  INTO BOTH NOSTRILS DAILY AS NEEDED FOR ALLERGIES, Disp: 48 mL, Rfl: 1   glucose blood (ACCU-CHEK GUIDE) test strip, Use to check fasting glucose and 2 hours after largest meal, Disp: 100 each, Rfl: 12   Insulin Pen Needle (PEN NEEDLES) 32G X 4 MM MISC, Use as directed for injecting insulin, Disp: 100 each, Rfl: 2   Lacosamide  100 MG TABS, TAKE 1 TABLET BY MOUTH TWICE A DAY, Disp: 60 tablet, Rfl: 5   latanoprost  (XALATAN ) 0.005 % ophthalmic solution, Place 1 drop into both eyes at bedtime., Disp: , Rfl:    liraglutide (VICTOZA) 18 MG/3ML SOPN, Inject 0.6 mg into the skin daily for 7 days, THEN 1.2 mg daily., Disp: 6 mL, Rfl: 2   metFORMIN  (GLUCOPHAGE ) 500 MG tablet, Take 2 tablets (1000mg )  by mouth in the morning and 1 tablet (500mg ) by mouth in the evening., Disp: 180 tablet, Rfl: 3   ondansetron  (ZOFRAN -ODT) 4 MG disintegrating tablet, Take 1 tablet (4 mg total) by mouth every 8 (eight) hours as needed for nausea or vomiting., Disp: 20 tablet, Rfl: 11   pilocarpine  (SALAGEN ) 5 MG tablet, Take 1 tablet (5 mg total) by mouth 2 (two) times daily., Disp: 60 tablet, Rfl: 11   RUXIENCE  500 MG/50ML injection, 1000mg  IV once every 24 weeks, Disp: 100 mL, Rfl: 1   SUMAtriptan  (IMITREX ) 100 MG tablet, Take 1 tablet (100 mg total) by mouth once as needed for migraine. May repeat in 2 hours if headache persists or recurs., Disp: 9 tablet, Rfl: 5   Ubrogepant  (UBRELVY ) 100 MG TABS, One po every day prn migraine, Disp: 10 tablet, Rfl: 11   verapamil  (CALAN -SR) 240 MG CR tablet, Take 1 tablet (240 mg total) by mouth at bedtime., Disp: 90 tablet, Rfl: 2  Current Facility-Administered Medications:    bupivacaine (PF) (MARCAINE ) 0.5 % injection 2 mL, 2 mL, Other, Once, Ishitha Roper A, MD   lidocaine  (XYLOCAINE ) 2 % (with pres) injection 40 mg, 2 mL, Intradermal, Once, Alayzha An, Charlie LABOR, MD  PAST MEDICAL HISTORY: Past Medical History:  Diagnosis Date   Anxiety    Blind right eye 2009   swelling & pressure    Cluster headaches    started after seizure and migraines   Complication of anesthesia    trouble with short term memory after surgeries   COVID-19  Depression    Diastolic dysfunction    a. 01/2021 Echo: EF 60-65%, no rwma, GrII DD, nl RV fxn, mildly dil LA. Triv AI.   Fibroids    s/p TLH, bilateral salpingectomy   GERD (gastroesophageal reflux disease)    patient thinks due to medications   History of anemia    prior to hysterectomy   History of pneumonia    History of trichomoniasis    Seizures (HCC) x 1 2-3 yrs ago none since   once saw neurologist, full body brain MRI and another 6 months lster   Status post right oophorectomy    Thrombocytopenia (HCC)    sees [place] with Heme Onc   Vasculitis (HCC)    neuro    PAST SURGICAL HISTORY: Past Surgical History:  Procedure Laterality Date   CYSTOSCOPY N/A 07/31/2017   Procedure: CYSTOSCOPY;  Surgeon: Cleotilde Ronal RAMAN, MD;  Location: [facility];  Service: Gynecology;  Laterality: N/A;   ENDOMETRIAL ABLATION     EYE SURGERY     laser - removed blood from right eye   HYSTEROSCOPY WITH D & C     LAPAROSCOPY N/A 09/19/2017   Procedure: LAPAROSCOPY DIAGNOSTIC WITH RIGHT OOPHERECTOMY, LYSIS OF ADHESIONS;  Surgeon: Jertson, Jill Evelyn, MD;  Location: [facility];  Service: Gynecology;  Laterality: N/A;   OMENTECTOMY N/A 11/21/2017   Procedure: OMENTECTOMY AND PELVIC WASHINGS;  Surgeon: Anitra Freddy NOVAK, MD;  Location: [facility];  Service: Gynecology;  Laterality: N/A;   ROBOTIC ASSISTED SALPINGO OOPHERECTOMY Left 11/21/2017   Procedure: XI ROBOTIC ASSISTED LEFT SALPINGO OOPHORECTOMY;  Surgeon: Anitra Freddy NOVAK, MD;  Location: [facility];  Service: Gynecology;  Laterality: Left;   TOTAL LAPAROSCOPIC HYSTERECTOMY WITH SALPINGECTOMY Bilateral 07/31/2017   Procedure: TOTAL LAPAROSCOPIC HYSTERECTOMY WITH SALPINGECTOMY;  Surgeon: Cleotilde Ronal RAMAN, MD;  Location: [facility];  Service: Gynecology;  Laterality: Bilateral;  20 week size uterus/ Alexis bag in room/  need 4.5 hours   TUBAL LIGATION     WISDOM TOOTH EXTRACTION      FAMILY HISTORY: Family History  Problem Relation Age of Onset   Sleep apnea Mother    Alcoholism Father    Cirrhosis Father    Alcohol abuse Father    Cancer Maternal Grandmother        unsure of type; died of other causes   Heart disease Maternal Aunt    Drug abuse Cousin     SOCIAL HISTORY:  Social History   Socioeconomic History   Marital status: Single    Spouse name: Not on file   Number of children: 3   Years of education: Not on file   Highest education level: Associate degree: occupational, Scientist, product/process development, or vocational program  Occupational History   Not on file  Tobacco Use   Smoking status: Former    Current packs/day: 0.00    Types: Cigarettes, E-cigarettes    Start date: 05/05/1998    Quit date: 05/05/2018    Years since quitting: 5.5   Smokeless tobacco: Never  Vaping Use   Vaping status: Former  Substance and Sexual Activity   Alcohol use: Not Currently   Drug use: No   Sexual activity: Yes    Birth control/protection: Surgical    Comment: hysterectomy  Other Topics Concern   Not on file  Social History Narrative   Not on file   Social Drivers of Health   Financial Resource Strain: Medium Risk (04/12/2023)   Overall Financial Resource Strain (CARDIA)    Difficulty of Paying  Living Expenses: Somewhat hard  Food Insecurity: Food Insecurity Present (04/12/2023)   Hunger Vital Sign    Worried About Running Out of Food in the Last Year: Often true    Ran Out of Food in the Last Year: Sometimes true  Transportation Needs: No Transportation Needs (04/12/2023)   PRAPARE - Administrator, Civil Service (Medical): No    Lack of Transportation (Non-Medical): No  Physical Activity: Inactive (04/12/2023)   Exercise Vital Sign    Days of Exercise per Week: 0 days    Minutes of Exercise per Session: 0 min  Stress: Stress Concern Present (04/12/2023)   [employer] of Occupational Health  - Occupational Stress Questionnaire    Feeling of Stress : Very much  Social Connections: Socially Isolated (04/12/2023)   Social Connection and Isolation Panel    Frequency of Communication with Friends and Family: Never    Frequency of Social Gatherings with Friends and Family: Never    Attends Religious Services: Never    Database administrator or Organizations: No    Attends Banker Meetings: Never    Marital Status: Never married  Intimate Partner Violence: Not At Risk (04/12/2023)   Humiliation, Afraid, Rape, and Kick questionnaire    Fear of Current or Ex-Partner: No    Emotionally Abused: No    Physically Abused: No    Sexually Abused: No     PHYSICAL EXAM  Vitals:   11/06/23 1610  BP: 131/87  Pulse: 84  Weight: 196 lb (88.9 kg)  Height: 5' (1.524 m)    Body mass index is 38.28 kg/m.   General: The patient is well-developed and well-nourished and in no acute distress.  Eyes:   She has mild conjunctival erythema on the right..    Neck: She has tenderness over the right occiput and the right splenius capitis muscle.  The  range of motion is fairly normal in the neck.  Neurologic Exam  Mental status: The patient is alert and oriented x 3 at the time of the examination. The patient has apparent normal recent and remote memory, with an apparently normal attention span and concentration ability.   Speech is normal.  Cranial nerves: Extraocular movements are full.  Very mild right ptosis and facial strength is normal elsewhere.  There is normal facial sensation to soft touch.   Trapezius and sternocleidomastoid strength is normal. No dysarthria is noted.  Hearing is symmetric.  Motor:  Muscle bulk is normal.   Tone is normal. Strength is  5 / 5 in all 4 extremities.   Sensory: She has normal sensation to touch and vibration in the arms and legs   Gait and station: Station is normal.  Her gait is normal though the tandem gait is slightly wide. DTRs:  Normal  and symmetric    DIAGNOSTIC DATA (LABS, IMAGING, TESTING) - I reviewed patient records, labs, notes, testing and imaging myself where available.  Lab Results  Component Value Date   WBC 5.3 05/08/2023   HGB 13.3 05/08/2023   HCT 42.6 05/08/2023   MCV 92 05/08/2023   PLT 118 (L) 05/08/2023      Component Value Date/Time   NA 142 07/06/2023 1521   NA 138 08/31/2016 0957   K 4.0 07/06/2023 1521   K 3.9 08/31/2016 0957   CL 103 07/06/2023 1521   CO2 21 07/06/2023 1521   CO2 22 08/31/2016 0957   GLUCOSE 104 (H) 07/06/2023 1521   GLUCOSE 148 (H)  07/10/2018 0915   GLUCOSE 106 08/31/2016 0957   BUN 8 07/06/2023 1521   BUN 8.6 08/31/2016 0957   CREATININE 0.75 07/06/2023 1521   CREATININE 0.8 08/31/2016 0957   CALCIUM  10.7 (H) 07/06/2023 1521   CALCIUM  10.3 08/31/2016 0957   PROT 7.4 07/06/2023 1521   PROT 7.6 08/31/2016 0957   ALBUMIN 5.1 (H) 07/06/2023 1521   ALBUMIN 3.9 08/31/2016 0957   AST 16 07/06/2023 1521   AST 18 08/31/2016 0957   ALT 24 07/06/2023 1521   ALT 23 08/31/2016 0957   ALKPHOS 95 07/06/2023 1521   ALKPHOS 62 08/31/2016 0957   BILITOT 0.5 07/06/2023 1521   BILITOT 0.43 08/31/2016 0957   GFRNONAA 111 02/04/2019 0902   GFRAA 128 02/04/2019 0902         PROCEDURE Sphenopalatine ganglion block The patient was placed in the supine position.  A 20g catheter was lubricated with gel, and placed in the right naris. The catheter was inserted above the middle turbinate towards the posterior nasal cavity. .  2 mL of 2% lidocaine  was placed. The patient was asked to swallow during the injection. The patient demonstrated erythema of the sclera of the eye on this side, and tearing.   The patient tolerated the procedure well. No complications of the procedure were noted. The patient was kept in the supine position for 2-3 minutes following the procedure.  By 3 minutes the headache had resolved.     ASSESSMENT AND PLAN  CNS vasculitis (HCC) - Plan: MR BRAIN W WO  CONTRAST, IgG, IgA, IgM, CBC with Differential/Platelet  Vascular headache - Plan: MR BRAIN W WO CONTRAST  Migraine with aura and without status migrainosus, not intractable  High risk medication use - Plan: IgG, IgA, IgM, CBC with Differential/Platelet   1.   Continue Rituximab  for CNS vaculitis   check labs 2.   Seizures are well-controlled and she will continue Vimpat .    3.   Prefer not to have her increasE Wellbutrin  as it might increase risk of seizure.   Consider change to nefazodone since Trintellix  expensive (SSRI not helpful and nefazodone has most similar MOA).     4.    She has nocturnal hypoxemia.   Continue 2L O2 at night.  5.   Ubrelvy  100 mg prn (2 samples [id number] 02/2025) and send in script.  Also gave samples of Nurtec [id number] 01/2027.  Addendum: If there is no benefit she can take a Tylenol  3 and we can refill if needed 6.   Right sphenopalatine ganglion block with 2 cc of 2% lidocaine .  She reported pain was much better afterwards. 7.   Right splenius capitus trigger point injection with 80 mg Depo-Medrol  (Lot [id number]  )  in 3 cc Lidocaine  using sterile technique.  She tolerated the procedure well and pain was better afterwards.  return in 6 months.  She will call if she has new or worsening symptoms.    Joaquina Nissen A. Vear, MD, PhD 11/06/2023, 4:42 PM Certified in Neurology, Clinical Neurophysiology, Sleep Medicine, Pain Medicine and Neuroimaging   [facility] [address] [place], [place] [address] [phone]

## 2023-11-07 ENCOUNTER — Telehealth: Payer: Self-pay | Admitting: Neurology

## 2023-11-07 ENCOUNTER — Telehealth: Payer: Self-pay | Admitting: Pharmacist

## 2023-11-07 DIAGNOSIS — Z79899 Other long term (current) drug therapy: Secondary | ICD-10-CM | POA: Diagnosis not present

## 2023-11-07 DIAGNOSIS — I776 Arteritis, unspecified: Secondary | ICD-10-CM | POA: Diagnosis not present

## 2023-11-07 NOTE — Telephone Encounter (Signed)
 Pharmacy Patient Advocate Encounter  Received notification from [facility] that Prior Authorization for UBRELVY  100 MG PO TABS has been APPROVED from 11/07/2023 to 11/06/2024   PA #/Case ID/Reference #: [id number]

## 2023-11-07 NOTE — Telephone Encounter (Signed)
 Pharmacy Patient Advocate Encounter   Received notification from Patient Pharmacy that prior authorization for Ubrelvy  100MG  tablets is required/requested.   Insurance verification completed.   The patient is insured through [facility] .   Per test claim: PA required; PA submitted to above mentioned insurance via Latent Key/confirmation #/EOC [id number] Status is pending

## 2023-11-07 NOTE — Telephone Encounter (Signed)
 sent to GI they obtain Drucie Opitz [phone]

## 2023-11-08 LAB — CBC WITH DIFFERENTIAL/PLATELET
Basophils Absolute: 0 x10E3/uL (ref 0.0–0.2)
Basos: 1 %
EOS (ABSOLUTE): 0.2 x10E3/uL (ref 0.0–0.4)
Eos: 3 %
Hematocrit: 40.9 % (ref 34.0–46.6)
Hemoglobin: 13.5 g/dL (ref 11.1–15.9)
Immature Grans (Abs): 0 x10E3/uL (ref 0.0–0.1)
Immature Granulocytes: 0 %
Lymphocytes Absolute: 1.7 x10E3/uL (ref 0.7–3.1)
Lymphs: 29 %
MCH: 29.1 pg (ref 26.6–33.0)
MCHC: 33 g/dL (ref 31.5–35.7)
MCV: 88 fL (ref 79–97)
Monocytes Absolute: 0.4 x10E3/uL (ref 0.1–0.9)
Monocytes: 7 %
Neutrophils Absolute: 3.4 x10E3/uL (ref 1.4–7.0)
Neutrophils: 59 %
Platelets: 121 x10E3/uL — ABNORMAL LOW (ref 150–450)
RBC: 4.64 x10E6/uL (ref 3.77–5.28)
RDW: 13.2 % (ref 11.7–15.4)
WBC: 5.7 x10E3/uL (ref 3.4–10.8)

## 2023-11-08 LAB — IGG, IGA, IGM
IgA/Immunoglobulin A, Serum: 317 mg/dL (ref 87–352)
IgG (Immunoglobin G), Serum: 929 mg/dL (ref 586–1602)
IgM (Immunoglobulin M), Srm: 18 mg/dL — ABNORMAL LOW (ref 26–217)

## 2023-11-09 ENCOUNTER — Ambulatory Visit: Payer: Self-pay | Admitting: Neurology

## 2023-11-14 ENCOUNTER — Ambulatory Visit
Admission: RE | Admit: 2023-11-14 | Discharge: 2023-11-14 | Disposition: A | Discharge status: Home / Self Care | Source: Ambulatory Visit | Attending: Neurology | Admitting: Neurology

## 2023-11-14 DIAGNOSIS — G441 Vascular headache, not elsewhere classified: Secondary | ICD-10-CM

## 2023-11-14 DIAGNOSIS — I776 Arteritis, unspecified: Secondary | ICD-10-CM

## 2023-11-14 MED ORDER — GADOPICLENOL 0.5 MMOL/ML IV SOLN
9.0000 mL | Freq: Once | INTRAVENOUS | Status: AC | PRN
  Administered 2023-11-14: 9 mL via INTRAVENOUS

## 2023-11-19 ENCOUNTER — Ambulatory Visit: Payer: Self-pay

## 2023-11-19 NOTE — Telephone Encounter (Signed)
 Patient advised of recommendation wanted to know could mounjaro  be called in advised per provider no this was in the same class as victoza and needed to start back metformin  after stopping victoza patient will take metformin  but wanted provider to know that this causes dry mouth and stinky breath

## 2023-11-19 NOTE — Telephone Encounter (Signed)
 FYI Only or Action Required?: Action required by provider: clinical question for provider. Patient is having low blood sugars for the past couple of weeks ranging from 59-90. Patient is needing guidance in regard her Victoza. Needing test strips sent to the pharmacy as well.   Patient was last seen in primary care on 10/03/2023 by Knute Thersia Bitters, FNP.  Called Nurse Triage reporting Hypoglycemia.  Symptoms began several weeks ago.  Interventions attempted: Rest, hydration, or home remedies and Dietary changes.  Symptoms are: unchanged.-current BS is 113. Patient states she is having to eat continuously throughout the day to try preventing her blood sugar crashing.   Triage Disposition: Call PCP Now  Patient/caregiver understands and will follow disposition?: Yes  Copied from CRM [phone]. Topic: Clinical - Red Word Triage >> Nov 19, 2023  2:16 PM Tobias CROME wrote: Red Word that prompted transfer to Nurse Triage: sugar level 59/29/79, patient states she gets clammy, sweaty, really hot when her sugar levels drop that low. Reason for Disposition  [1] Caller has URGENT medication or insulin device (e.g., pump, continuous monitoring) question AND [2] triager unable to answer question  Answer Assessment - Initial Assessment Questions 1. SYMPTOMS: What symptoms are you concerned about?     Patient states she is having low blood sugars ranging in the 60s-90s. 2. ONSET:  When did the symptoms start?     Started 2-3 weeks ago.  3. BLOOD GLUCOSE: What is your blood glucose level?      69 4. USUAL RANGE: What is your blood glucose level usually? (e.g., usual fasting morning value, usual evening value)     95-130 prior to starting the Victoza-59-90. 5. TYPE 1 or 2:  Do you know what type of diabetes you have?  (e.g., Type 1, Type 2, Gestational; doesn't know)      Type 2 6. INSULIN: Do you take insulin? What type of insulin(s) do you use? What is the mode of delivery? (syringe,  pen; injection or pump) When did you last give yourself an insulin dose? (i.e., time or hours/minutes ago) How much did you give? (i.e., how many units)     no 7. DIABETES PILLS: Do you take any pills for your diabetes? If Yes, ask: What is the name of the medicine(s) that you take for high blood sugar?     No-taking Victoza 8. OTHER SYMPTOMS: Do you have any symptoms? (e.g., fever, frequent urination, difficulty breathing, vomiting)     Vomited, nausea 9. LOW BLOOD GLUCOSE TREATMENT: What have you done so far to treat the low blood glucose level?     Been eating food with protein 10. FOOD: When did you last eat or drink?       Just eat a snack 11. ALONE: Are you alone right now or is someone with you?        Patient is at work 12. PREGNANCY: Is there any chance you are pregnant? When was your last menstrual period?       no  Protocols used: Diabetes - Low Blood Sugar-A-AH

## 2023-11-20 ENCOUNTER — Ambulatory Visit: Admitting: Neurology

## 2023-11-30 ENCOUNTER — Telehealth: Admitting: Family Medicine

## 2023-11-30 ENCOUNTER — Ambulatory Visit (HOSPITAL_BASED_OUTPATIENT_CLINIC_OR_DEPARTMENT_OTHER): Admitting: Family Medicine

## 2023-11-30 ENCOUNTER — Telehealth (HOSPITAL_COMMUNITY): Payer: Self-pay | Admitting: *Deleted

## 2023-11-30 ENCOUNTER — Other Ambulatory Visit (HOSPITAL_BASED_OUTPATIENT_CLINIC_OR_DEPARTMENT_OTHER): Payer: Self-pay | Admitting: Family Medicine

## 2023-11-30 DIAGNOSIS — E782 Mixed hyperlipidemia: Secondary | ICD-10-CM

## 2023-11-30 DIAGNOSIS — E119 Type 2 diabetes mellitus without complications: Secondary | ICD-10-CM

## 2023-11-30 DIAGNOSIS — F321 Major depressive disorder, single episode, moderate: Secondary | ICD-10-CM

## 2023-11-30 NOTE — Telephone Encounter (Signed)
 Call from patient in tears. States she can not get an appt with Eddie PA till 11/5. She is depressed and her Trintellex had been working great but was told can't get more samples and her PA was refused by her insurance company so she has not been on it. She continues to take her Welbutrin but she is very depressed. States Cavalero PA had suggested an alternate med and she is asking if he can give her it or something else for her depression. I will forward this concern to the provider and call her back with outcome before 2.

## 2023-11-30 NOTE — Progress Notes (Signed)
 Pt is needing to start meds in addition to her wellbutrin . She is given the information to go to the [facility]- address and number and plans to go there now. DWB

## 2023-12-01 ENCOUNTER — Other Ambulatory Visit (HOSPITAL_COMMUNITY): Payer: Self-pay | Admitting: Physician Assistant

## 2023-12-01 DIAGNOSIS — F411 Generalized anxiety disorder: Secondary | ICD-10-CM

## 2023-12-01 DIAGNOSIS — F332 Major depressive disorder, recurrent severe without psychotic features: Secondary | ICD-10-CM

## 2023-12-01 DIAGNOSIS — F4310 Post-traumatic stress disorder, unspecified: Secondary | ICD-10-CM

## 2023-12-01 LAB — HEMOGLOBIN A1C
Est. average glucose Bld gHb Est-mCnc: 137 mg/dL
Hgb A1c MFr Bld: 6.4 % — ABNORMAL HIGH (ref 4.8–5.6)

## 2023-12-01 LAB — LIPID PANEL
Chol/HDL Ratio: 3 ratio (ref 0.0–4.4)
Cholesterol, Total: 163 mg/dL (ref 100–199)
HDL: 54 mg/dL (ref >39)
LDL Chol Calc (NIH): 82 mg/dL (ref 0–99)
Triglycerides: 155 mg/dL — ABNORMAL HIGH (ref 0–149)
VLDL Cholesterol Cal: 27 mg/dL (ref 5–40)

## 2023-12-01 LAB — COMPREHENSIVE METABOLIC PANEL WITH GFR
ALT: 57 IU/L — ABNORMAL HIGH (ref 0–32)
AST: 25 IU/L (ref 0–40)
Albumin: 4.8 g/dL (ref 3.9–4.9)
Alkaline Phosphatase: 110 IU/L (ref 41–116)
BUN/Creatinine Ratio: 8 — ABNORMAL LOW (ref 9–23)
BUN: 6 mg/dL (ref 6–24)
Bilirubin Total: 0.6 mg/dL (ref 0.0–1.2)
CO2: 19 mmol/L — ABNORMAL LOW (ref 20–29)
Calcium: 10.2 mg/dL (ref 8.7–10.2)
Chloride: 105 mmol/L (ref 96–106)
Creatinine, Ser: 0.79 mg/dL (ref 0.57–1.00)
Globulin, Total: 2.9 g/dL (ref 1.5–4.5)
Glucose: 88 mg/dL (ref 70–99)
Potassium: 4.2 mmol/L (ref 3.5–5.2)
Sodium: 142 mmol/L (ref 134–144)
Total Protein: 7.7 g/dL (ref 6.0–8.5)
eGFR: 92 mL/min/1.73 (ref >59)

## 2023-12-01 MED ORDER — TRAZODONE HCL 50 MG PO TABS: 50.0000 mg | ORAL_TABLET | Freq: Every day | ORAL | 1 refills | Status: DC

## 2023-12-01 MED ORDER — SERTRALINE HCL 50 MG PO TABS: 50.0000 mg | ORAL_TABLET | Freq: Every day | ORAL | 1 refills | Status: DC

## 2023-12-01 NOTE — Progress Notes (Signed)
 Patient presents to the encounter stating that she has been struggling with depression and anxiety.  Patient is unable to continue taking Trintellix  due to being unable to afford the medication.  Patient wants to be placed on an antidepressant that is affordable and will help with her symptoms.  Patient also reports that she has been having issues with sleep.  Provider recommended patient be placed on sertraline 50 mg daily for the management of her depressive symptoms and anxiety.  Provider also recommended patient be placed on trazodone 50 mg at bedtime as needed for the management of her sleep issues.  Patient was agreeable to recommendations.  Patient's medications to be e-prescribed to pharmacy of choice.  Patient's next appointment with this provider scheduled for 01/09/2024.

## 2023-12-02 ENCOUNTER — Encounter: Payer: Self-pay | Admitting: Neurology

## 2023-12-03 ENCOUNTER — Other Ambulatory Visit: Payer: Self-pay

## 2023-12-04 ENCOUNTER — Other Ambulatory Visit: Payer: Self-pay | Admitting: Neurology

## 2023-12-04 MED ORDER — ACETAMINOPHEN-CODEINE 300-30 MG PO TABS: ORAL_TABLET | ORAL | 2 refills | Status: AC

## 2023-12-05 ENCOUNTER — Ambulatory Visit (HOSPITAL_BASED_OUTPATIENT_CLINIC_OR_DEPARTMENT_OTHER): Payer: Self-pay | Admitting: Family Medicine

## 2023-12-05 NOTE — Progress Notes (Signed)
 Hi Marshelle,  Your kidney function and electrolytes are stable. Slight elevation in liver enzyme but improved from prior. Your triglycerides have improved as well and are very close to goal (less than 150). Your A1C has decreased slightly to 6.4. We will discuss these further at your upcoming visit.

## 2023-12-18 ENCOUNTER — Other Ambulatory Visit (HOSPITAL_BASED_OUTPATIENT_CLINIC_OR_DEPARTMENT_OTHER): Payer: Self-pay | Admitting: Family Medicine

## 2023-12-18 ENCOUNTER — Other Ambulatory Visit: Payer: Self-pay | Admitting: Neurology

## 2023-12-18 DIAGNOSIS — E119 Type 2 diabetes mellitus without complications: Secondary | ICD-10-CM

## 2023-12-18 NOTE — Telephone Encounter (Signed)
 Follow up on 11/06/23  Follow up scheduled on 06/12/24

## 2023-12-24 ENCOUNTER — Other Ambulatory Visit (HOSPITAL_COMMUNITY): Payer: Self-pay | Admitting: Physician Assistant

## 2023-12-24 DIAGNOSIS — F332 Major depressive disorder, recurrent severe without psychotic features: Secondary | ICD-10-CM

## 2023-12-24 DIAGNOSIS — F411 Generalized anxiety disorder: Secondary | ICD-10-CM

## 2023-12-24 DIAGNOSIS — F4310 Post-traumatic stress disorder, unspecified: Secondary | ICD-10-CM

## 2023-12-27 ENCOUNTER — Ambulatory Visit (INDEPENDENT_AMBULATORY_CARE_PROVIDER_SITE_OTHER): Admitting: Family Medicine

## 2023-12-27 ENCOUNTER — Encounter (HOSPITAL_BASED_OUTPATIENT_CLINIC_OR_DEPARTMENT_OTHER): Payer: Self-pay | Admitting: Family Medicine

## 2023-12-27 VITALS — BP 120/65 | HR 81 | Ht 60.0 in | Wt 185.0 lb

## 2023-12-27 DIAGNOSIS — E782 Mixed hyperlipidemia: Secondary | ICD-10-CM | POA: Diagnosis not present

## 2023-12-27 DIAGNOSIS — I776 Arteritis, unspecified: Secondary | ICD-10-CM | POA: Diagnosis not present

## 2023-12-27 DIAGNOSIS — E119 Type 2 diabetes mellitus without complications: Secondary | ICD-10-CM

## 2023-12-27 DIAGNOSIS — Z23 Encounter for immunization: Secondary | ICD-10-CM | POA: Diagnosis not present

## 2023-12-27 DIAGNOSIS — Z1231 Encounter for screening mammogram for malignant neoplasm of breast: Secondary | ICD-10-CM | POA: Diagnosis not present

## 2023-12-27 MED ORDER — LIRAGLUTIDE 18 MG/3ML ~~LOC~~ SOPN: 1.8000 mg | PEN_INJECTOR | Freq: Every day | SUBCUTANEOUS | 2 refills | Status: DC

## 2023-12-27 NOTE — Progress Notes (Signed)
 Subjective:   Angel Rodriguez 02-11-1976 12/27/2023  Chief Complaint  Patient presents with   Medical Management of Chronic Issues    Pt is here today for oxygen recertification. States she only wears her oxygen at night and never wears it during the day.    HPI: Angel Rodriguez presents today for re-assessment and management of chronic medical conditions.  NOCTURNAL OXYGEN:  Pt has been wearing supplemental oxygen as prescribed. Patient's last polysomnography testing was on 10/20/2020. She did have consistent SpO2 below 90% while sleeping and requires nighttime oxygen dependence. Pt is compliant with 2L at night.   DIABETES MELLITUS: Angel Rodriguez presents for the medical management of diabetes.  Current diabetes medication regimen: Liraglutide 1.2mg  subcutaneous daily Patient is not adhering to a diabetic diet.  Patient is not exercising regularly.  Patient is not checking BS regularly. Patient is checking their feet regularly.  Denies polydipsia, polyphagia, polyuria, open wounds or ulcers on feet.  Lab Results  Component Value Date   HGBA1C 6.4 (H) 11/30/2023    Foot Exam: 04/12/2023 Lab Results  Component Value Date   MICROALBUR 30 10/03/2023   MICROALBUR 30 04/24/2022    Wt Readings from Last 3 Encounters:  12/27/23 185 lb (83.9 kg)  11/06/23 196 lb (88.9 kg)  10/03/23 195 lb (88.5 kg)   HYPERLIPIDEMIA Discussed lipid panel results from 11/30/23, specifically her triglyceride levels that have improved from previous (07/06/23). Pt states she has not changed her diet or started exercising as of yet, but since starting the Victoza she was hoping to improve her sugar levels & triglycerides.   Lab Results  Component Value Date   CHOL 163 11/30/2023   CHOL 190 07/06/2023   CHOL 184 04/12/2023   Lab Results  Component Value Date   HDL 54 11/30/2023   HDL 46 07/06/2023   HDL 44 04/12/2023   Lab Results  Component Value Date   LDLCALC 82 11/30/2023   LDLCALC  114 (H) 07/06/2023   LDLCALC 91 04/12/2023   Lab Results  Component Value Date   TRIG 155 (H) 11/30/2023   TRIG 173 (H) 07/06/2023   TRIG 296 (H) 04/12/2023   Lab Results  Component Value Date   CHOLHDL 3.0 11/30/2023   CHOLHDL 4.1 07/06/2023   CHOLHDL 4.2 04/12/2023   No results found for: LDLDIRECT   The following portions of the patient's history were reviewed and updated as appropriate: past medical history, past surgical history, family history, social history, allergies, medications, and problem list.   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   Pain in both hands 12/08/2019   GAD (generalized anxiety disorder) 09/03/2018   Panic disorder 06/06/2018   Acute left-sided low back pain without sciatica 05/27/2018   Mental confusion/ slurred speech 05/09/2018   High risk medication use 05/09/2018   Major depressive disorder, single episode, moderate (HCC) 05/09/2018   Bilateral hand numbness 03/18/2018   Vitamin D  deficiency 03/18/2018   Thrombocytopenia 03/14/2018   Dysarthria 03/14/2018   Brain lesion 03/14/2018   Right sided weakness 03/13/2018   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  Abnormal MRI of head 01/21/2016   Vascular headache 09/15/2015   Insomnia 09/15/2015   Intractable chronic migraine without aura and with status migrainosus 07/08/2015   Neck pain 06/24/2015   Seizure (HCC) 06/24/2015   Central retinal vein occlusion (HCC) 12/11/2011   Cotton wool exudates 12/11/2011   Cystoid macular edema 12/11/2011   Past Medical  History:  Diagnosis Date   Anxiety    Blind right eye 2009   swelling & pressure   Cluster headaches    started after seizure and migraines   Complication of anesthesia    trouble with short term memory after surgeries   COVID-19    Depression    Diastolic dysfunction    a. 01/2021 Echo: EF 60-65%, no rwma, GrII DD, nl RV fxn, mildly dil LA. Triv AI.   Fibroids    s/p TLH, bilateral salpingectomy   GERD (gastroesophageal reflux disease)    patient thinks due to medications   History of anemia    prior to hysterectomy   History of pneumonia    History of trichomoniasis    Seizures (HCC) x 1 2-3 yrs ago none since   once saw neurologist, full body brain MRI and another 6 months lster   Status post right oophorectomy    Thrombocytopenia    sees [place] with Heme Onc   Vasculitis    neuro   Past Surgical History:  Procedure Laterality Date   CYSTOSCOPY N/A 07/31/2017   Procedure: CYSTOSCOPY;  Surgeon: Cleotilde Ronal RAMAN, MD;  Location: [facility];  Service: Gynecology;  Laterality: N/A;   ENDOMETRIAL ABLATION     EYE SURGERY     laser - removed blood from right eye   HYSTEROSCOPY WITH D & C     LAPAROSCOPY N/A 09/19/2017   Procedure: LAPAROSCOPY DIAGNOSTIC WITH RIGHT OOPHERECTOMY, LYSIS OF ADHESIONS;  Surgeon: Jertson, Jill Evelyn, MD;  Location: [facility];  Service: Gynecology;  Laterality: N/A;   OMENTECTOMY N/A 11/21/2017   Procedure: OMENTECTOMY AND PELVIC WASHINGS;  Surgeon: Anitra Freddy NOVAK, MD;  Location: [facility];  Service: Gynecology;  Laterality: N/A;   ROBOTIC ASSISTED SALPINGO OOPHERECTOMY Left 11/21/2017   Procedure: XI ROBOTIC ASSISTED LEFT SALPINGO OOPHORECTOMY;  Surgeon: Anitra Freddy NOVAK, MD;  Location: [facility];  Service: Gynecology;  Laterality: Left;   TOTAL LAPAROSCOPIC HYSTERECTOMY WITH SALPINGECTOMY Bilateral 07/31/2017   Procedure: TOTAL LAPAROSCOPIC HYSTERECTOMY WITH SALPINGECTOMY;  Surgeon: Cleotilde Ronal RAMAN, MD;  Location: [facility];  Service: Gynecology;  Laterality:  Bilateral;  20 week size uterus/ Nanette Wirsing bag in room/ need 4.5 hours   TUBAL LIGATION     WISDOM TOOTH EXTRACTION     Family History  Problem Relation Age of Onset   Sleep apnea Mother    Alcoholism Father    Cirrhosis Father    Alcohol abuse Father    Cancer Maternal Grandmother        unsure of type; died of other causes   Heart disease Maternal Aunt    Drug abuse Cousin    Outpatient Medications Prior to Visit  Medication Sig Dispense Refill   Accu-Chek Softclix Lancets lancets Use as instructed 100 each 12   acetaminophen -codeine  (TYLENOL  #3) 300-30 MG tablet TAKE ONE TABLET BY MOUTH UP TO FOUR TIMES A DAY AS NEEDED 28 tablet 2   Ascorbic Acid (VITAMIN C) 1000 MG tablet Take 1,000 mg by mouth daily.     atorvastatin  (LIPITOR) 80 MG tablet Take 1 tablet (80 mg total) by mouth daily. 30 tablet 11  atropine  1 % ophthalmic solution INSTILL 1 DROP INTO AFFECTED EYE UP TO 3 TIMES DAILY 5 mL 3   b complex vitamins capsule Take 1 capsule by mouth daily.     Blood Glucose Monitoring Suppl (ACCU-CHEK GUIDE ME) w/Device KIT USE TO CHECK FASTING BLOOD SUGAR AND 2 HOURS AFTER LARGEST MEAL 1 kit 0   buPROPion  (WELLBUTRIN  XL) 150 MG 24 hr tablet Take 1 tablet (150 mg total) by mouth daily. 90 tablet 1   clonazePAM  (KLONOPIN ) 1 MG tablet TAKE 1 TABLET BY MOUTH TWICE A DAY 60 tablet 5   dorzolamide -timolol  (COSOPT ) 22.3-6.8 MG/ML ophthalmic solution Place 1 drop into the right eye 2 (two) times daily.      ezetimibe  (ZETIA ) 10 MG tablet Take 1 tablet (10 mg total) by mouth daily. 90 tablet 3   fluticasone  (FLONASE ) 50 MCG/ACT nasal spray USE 1 -2 SPRAYS INTO BOTH NOSTRILS DAILY AS NEEDED FOR ALLERGIES 16 mL 5   glucose blood (ACCU-CHEK GUIDE) test strip Use to check fasting glucose and 2 hours after largest meal 100 each 12   Insulin Pen Needle (PEN NEEDLES) 32G X 4 MM MISC Use as directed for injecting insulin 100 each 2   Lacosamide  100 MG TABS TAKE 1 TABLET BY MOUTH TWICE A DAY 60 tablet 5    latanoprost  (XALATAN ) 0.005 % ophthalmic solution Place 1 drop into both eyes at bedtime.     ondansetron  (ZOFRAN -ODT) 4 MG disintegrating tablet Take 1 tablet (4 mg total) by mouth every 8 (eight) hours as needed for nausea or vomiting. 20 tablet 11   pilocarpine  (SALAGEN ) 5 MG tablet Take 1 tablet (5 mg total) by mouth 2 (two) times daily. 60 tablet 11   RUXIENCE  500 MG/50ML injection 1000mg  IV once every 24 weeks 100 mL 1   sertraline (ZOLOFT) 50 MG tablet Take 1 tablet (50 mg total) by mouth daily. 30 tablet 1   SUMAtriptan  (IMITREX ) 100 MG tablet Take 1 tablet (100 mg total) by mouth once as needed for migraine. May repeat in 2 hours if headache persists or recurs. 9 tablet 5   traZODone (DESYREL) 50 MG tablet Take 1 tablet (50 mg total) by mouth at bedtime. 30 tablet 1   Ubrogepant  (UBRELVY ) 100 MG TABS One po every day prn migraine 10 tablet 11   verapamil  (CALAN -SR) 240 MG CR tablet Take 1 tablet (240 mg total) by mouth at bedtime. 90 tablet 2   liraglutide (VICTOZA) 18 MG/3ML SOPN Inject 0.6 mg into the skin daily for 7 days, THEN 1.2 mg daily. 6 mL 2   metFORMIN  (GLUCOPHAGE ) 500 MG tablet Take 2 tablets (1000mg )  by mouth in the morning and 1 tablet (500mg ) by mouth in the evening. 270 tablet 3   Facility-Administered Medications Prior to Visit  Medication Dose Route Frequency Provider Last Rate Last Admin   bupivacaine (PF) (MARCAINE ) 0.5 % injection 2 mL  2 mL Other Once Sater, Charlie LABOR, MD       lidocaine  (XYLOCAINE ) 2 % (with pres) injection 40 mg  2 mL Intradermal Once Sater, Charlie LABOR, MD       Allergies  Allergen Reactions   Ivp Dye [Iodinated Contrast Media] Hives, Itching and Swelling   Bee Venom Swelling   Buspar  [Buspirone ] Other (See Comments)     ROS: A complete ROS was performed with pertinent positives/negatives noted in the HPI. The remainder of the ROS are negative.    Objective:   Today's Vitals   12/27/23 0922  BP:  120/65  Pulse: 81  SpO2: 97%  Weight:  185 lb (83.9 kg)  Height: 5' (1.524 m)      GENERAL: Well-appearing, in NAD. Well nourished.  SKIN: Pink, warm and dry.  Head: Normocephalic. NECK: Trachea midline.  THROAT: Mucous membranes pink and moist.  RESPIRATORY: Chest wall symmetrical. Respirations even and non-labored. CARDIAC: Peripheral pulses 2+ bilaterally.  MSK: Muscle tone and strength appropriate for age.  EXTREMITIES: Without clubbing, cyanosis, or edema.  NEUROLOGIC: No motor or sensory deficits. Steady, even gait. C2-C12 intact.  PSYCH/MENTAL STATUS: Alert, oriented x 3. Cooperative, appropriate mood and affect.   Health Maintenance Due  Topic Date Due   OPHTHALMOLOGY EXAM  11/28/2018   Mammogram  11/05/2022   COVID-19 Vaccine (5 - 2025-26 season) 11/05/2023    No results found for any visits on 12/27/23.  The 10-year ASCVD risk score (Arnett DK, et al., 2019) is: 1.5%     Assessment & Plan:  1. Type 2 diabetes mellitus without complication, without long-term current use of insulin (HCC) Controlled. Victoza increased to 1.8mg  daily per patient request and Rx sent to pt's pharmacy. Referral to diabetic nutritionist also placed.  - liraglutide (VICTOZA) 18 MG/3ML SOPN; Inject 1.8 mg into the skin daily.  Dispense: 9 mL; Refill: 2 - Ambulatory referral to diabetic education  2. Mixed hyperlipidemia Controlled. Most recent lipid panel results discussed with pt. Triglycerides are improving from previous, likely related to Victoza use x33mo. Pt encouraged to consume a low fat diet.   3. CNS vasculitis Pt will continue supplemental O2 at home. [facility] recertification faxed in today.   4. Immunization due (Primary) Flu vaccine updated in the office today.  - Flu vaccine trivalent PF, 6mos and older(Flulaval,Afluria,Fluarix,Fluzone)  5. Encounter for screening mammogram for malignant neoplasm of breast Reminded pt that she is due for mammogram. Pt verbalized understanding and states she will stop by radiology  on her way out of the office today.     Meds ordered this encounter  Medications   liraglutide (VICTOZA) 18 MG/3ML SOPN    Sig: Inject 1.8 mg into the skin daily.    Dispense:  9 mL    Refill:  2   Lab Orders  No laboratory test(s) ordered today   No images are attached to the encounter or orders placed in the encounter.  Return in about 6 months (around 06/26/2024) for Diabetes and AE .    Patient to reach out to office if new, worrisome, or unresolved symptoms arise or if no improvement in patient's condition. Patient verbalized understanding and is agreeable to treatment plan. All questions answered to patient's satisfaction.   Thersia Stark, FNP-C

## 2023-12-27 NOTE — Patient Instructions (Signed)
 Increase water  intake, add fiber & protein to diet   Schedule eye exam & mammogram

## 2023-12-27 NOTE — Progress Notes (Signed)
 Subjective:   Angel Rodriguez 1975-09-17 12/27/2023  Chief Complaint  Patient presents with   Medical Management of Chronic Issues    Pt is here today for oxygen recertification. States she only wears her oxygen at night and never wears it during the day.    HPI: Angel Rodriguez presents today for re-assessment and management of chronic medical conditions.  NOCTURNAL OXYGEN:  Pt has been wearing supplemental oxygen as prescribed.   DIABETES MELLITUS: Angel Rodriguez presents for the medical management of diabetes.  Current diabetes medication regimen: Liraglutide 1.2mg  subcutaneous daily Patient is not adhering to a diabetic diet.  Patient is not exercising regularly.  Patient is not checking BS regularly. Patient is checking their feet regularly.  Denies polydipsia, polyphagia, polyuria, open wounds or ulcers on feet.  Lab Results  Component Value Date   HGBA1C 6.4 (H) 11/30/2023    Foot Exam: 04/12/2023 Lab Results  Component Value Date   MICROALBUR 30 10/03/2023   MICROALBUR 30 04/24/2022    Wt Readings from Last 3 Encounters:  12/27/23 83.9 kg  11/06/23 88.9 kg  10/03/23 88.5 kg   HYPERLIPIDEMIA Discussed lipid panel results from 11/30/23, specifically her triglyceride levels that have improved from previous (07/06/23). Pt states she has not changed her diet or started exercising as of yet, but since starting the Victoza she was hoping to improve her sugar levels & triglycerides.   The following portions of the patient's history were reviewed and updated as appropriate: past medical history, past surgical history, family history, social history, allergies, medications, and problem list.   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   Pain in both hands 12/08/2019   GAD (generalized anxiety disorder) 09/03/2018   Panic disorder 06/06/2018   Acute left-sided low back pain without sciatica 05/27/2018   Mental confusion/ slurred speech 05/09/2018   High risk medication use 05/09/2018   Major depressive disorder, single episode, moderate (HCC) 05/09/2018   Bilateral hand numbness 03/18/2018   Vitamin D  deficiency 03/18/2018   Thrombocytopenia 03/14/2018   Dysarthria 03/14/2018   Brain lesion 03/14/2018   Right sided weakness 03/13/2018   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   Abnormal MRI of head 01/21/2016   Vascular headache 09/15/2015   Insomnia 09/15/2015   Intractable chronic migraine without aura and with status migrainosus 07/08/2015   Neck pain 06/24/2015   Seizure (HCC) 06/24/2015   Central retinal vein occlusion (HCC) 12/11/2011   Cotton wool exudates 12/11/2011   Cystoid macular edema 12/11/2011   Past Medical History:  Diagnosis Date   Anxiety    Blind right eye 2009   swelling & pressure   Cluster headaches    started after seizure and migraines   Complication of anesthesia    trouble with short term memory after surgeries   COVID-19    Depression    Diastolic dysfunction    a. 01/2021 Echo: EF 60-65%, no rwma, GrII DD, nl RV fxn, mildly dil LA. Triv AI.   Fibroids    s/p TLH, bilateral salpingectomy   GERD (gastroesophageal reflux disease)    patient  thinks due to medications   History of anemia    prior to hysterectomy   History of pneumonia    History of trichomoniasis    Seizures (HCC) x 1 2-3 yrs ago none since   once saw neurologist, full body brain MRI and another 6 months lster   Status post right oophorectomy    Thrombocytopenia    sees Angel Rodriguez with Heme Onc   Vasculitis     neuro   Past Surgical History:  Procedure Laterality Date   CYSTOSCOPY N/A 07/31/2017   Procedure: CYSTOSCOPY;  Surgeon: Angel Ronal RAMAN, MD;  Location: [facility];  Service: Gynecology;  Laterality: N/A;   ENDOMETRIAL ABLATION     EYE SURGERY     laser - removed blood from right eye   HYSTEROSCOPY WITH D & C     LAPAROSCOPY N/A 09/19/2017   Procedure: LAPAROSCOPY DIAGNOSTIC WITH RIGHT OOPHERECTOMY, LYSIS OF ADHESIONS;  Surgeon: Angel Rodriguez, Angel Evelyn, MD;  Location: [facility];  Service: Gynecology;  Laterality: N/A;   OMENTECTOMY N/A 11/21/2017   Procedure: OMENTECTOMY AND PELVIC WASHINGS;  Surgeon: Angel Freddy NOVAK, MD;  Location: [facility];  Service: Gynecology;  Laterality: N/A;   ROBOTIC ASSISTED SALPINGO OOPHERECTOMY Left 11/21/2017   Procedure: XI ROBOTIC ASSISTED LEFT SALPINGO OOPHORECTOMY;  Surgeon: Angel Freddy NOVAK, MD;  Location: [facility];  Service: Gynecology;  Laterality: Left;   TOTAL LAPAROSCOPIC HYSTERECTOMY WITH SALPINGECTOMY Bilateral 07/31/2017   Procedure: TOTAL LAPAROSCOPIC HYSTERECTOMY WITH SALPINGECTOMY;  Surgeon: Angel Ronal RAMAN, MD;  Location: [facility];  Service: Gynecology;  Laterality: Bilateral;  20 week size uterus/ Alexis bag in room/ need 4.5 hours   TUBAL LIGATION     WISDOM TOOTH EXTRACTION     Family History  Problem Relation Age of Onset   Sleep apnea Mother    Alcoholism Father    Cirrhosis Father    Alcohol abuse Father    Cancer Maternal Grandmother        unsure of type; died of other causes   Heart disease Maternal Aunt    Drug abuse Cousin    Outpatient Medications Prior to Visit  Medication Sig Dispense Refill   Accu-Chek Softclix Lancets lancets Use as instructed 100 each 12   acetaminophen -codeine  (TYLENOL  #3) 300-30 MG tablet TAKE ONE TABLET BY MOUTH UP TO FOUR TIMES A DAY AS NEEDED 28 tablet 2   Ascorbic Acid (VITAMIN C) 1000 MG tablet Take 1,000 mg by mouth daily.     atorvastatin  (LIPITOR) 80 MG tablet Take 1 tablet (80 mg total) by mouth daily. 30  tablet 11   atropine  1 % ophthalmic solution INSTILL 1 DROP INTO AFFECTED EYE UP TO 3 TIMES DAILY 5 mL 3   b complex vitamins capsule Take 1 capsule by mouth daily.     Blood Glucose Monitoring Suppl (ACCU-CHEK GUIDE ME) w/Device KIT USE TO CHECK FASTING BLOOD SUGAR AND 2 HOURS AFTER LARGEST MEAL 1 kit 0   buPROPion  (WELLBUTRIN  XL) 150 MG 24 hr tablet Take 1 tablet (150 mg total) by mouth daily. 90 tablet 1   clonazePAM  (KLONOPIN ) 1 MG tablet TAKE 1 TABLET BY MOUTH TWICE A DAY 60 tablet 5   dorzolamide -timolol  (COSOPT ) 22.3-6.8 MG/ML ophthalmic solution Place 1 drop into the right eye 2 (two) times daily.      ezetimibe  (ZETIA ) 10 MG tablet Take 1 tablet (10 mg total) by mouth daily. 90 tablet 3   fluticasone  (FLONASE ) 50 MCG/ACT nasal spray USE 1 -2 SPRAYS INTO BOTH NOSTRILS DAILY  AS NEEDED FOR ALLERGIES 16 mL 5   glucose blood (ACCU-CHEK GUIDE) test strip Use to check fasting glucose and 2 hours after largest meal 100 each 12   Insulin Pen Needle (PEN NEEDLES) 32G X 4 MM MISC Use as directed for injecting insulin 100 each 2   Lacosamide  100 MG TABS TAKE 1 TABLET BY MOUTH TWICE A DAY 60 tablet 5   latanoprost  (XALATAN ) 0.005 % ophthalmic solution Place 1 drop into both eyes at bedtime.     ondansetron  (ZOFRAN -ODT) 4 MG disintegrating tablet Take 1 tablet (4 mg total) by mouth every 8 (eight) hours as needed for nausea or vomiting. 20 tablet 11   pilocarpine  (SALAGEN ) 5 MG tablet Take 1 tablet (5 mg total) by mouth 2 (two) times daily. 60 tablet 11   RUXIENCE  500 MG/50ML injection 1000mg  IV once every 24 weeks 100 mL 1   sertraline (ZOLOFT) 50 MG tablet Take 1 tablet (50 mg total) by mouth daily. 30 tablet 1   SUMAtriptan  (IMITREX ) 100 MG tablet Take 1 tablet (100 mg total) by mouth once as needed for migraine. May repeat in 2 hours if headache persists or recurs. 9 tablet 5   traZODone (DESYREL) 50 MG tablet Take 1 tablet (50 mg total) by mouth at bedtime. 30 tablet 1   Ubrogepant  (UBRELVY ) 100  MG TABS One po every day prn migraine 10 tablet 11   verapamil  (CALAN -SR) 240 MG CR tablet Take 1 tablet (240 mg total) by mouth at bedtime. 90 tablet 2   liraglutide (VICTOZA) 18 MG/3ML SOPN Inject 0.6 mg into the skin daily for 7 days, THEN 1.2 mg daily. 6 mL 2   metFORMIN  (GLUCOPHAGE ) 500 MG tablet Take 2 tablets (1000mg )  by mouth in the morning and 1 tablet (500mg ) by mouth in the evening. 270 tablet 3   Facility-Administered Medications Prior to Visit  Medication Dose Route Frequency Provider Last Rate Last Admin   bupivacaine (PF) (MARCAINE ) 0.5 % injection 2 mL  2 mL Other Once Sater, Charlie LABOR, MD       lidocaine  (XYLOCAINE ) 2 % (with pres) injection 40 mg  2 mL Intradermal Once Sater, Charlie LABOR, MD       Allergies  Allergen Reactions   Ivp Dye [Iodinated Contrast Media] Hives, Itching and Swelling   Bee Venom Swelling   Buspar  [Buspirone ] Other (See Comments)     ROS: A complete ROS was performed with pertinent positives/negatives noted in the HPI. The remainder of the ROS are negative.    Objective:   Today's Vitals   12/27/23 0922  BP: 120/65  Pulse: 81  SpO2: 97%  Weight: 83.9 kg  Height: 5' (1.524 m)      GENERAL: Well-appearing, in NAD. Well nourished.  SKIN: Pink, warm and dry.  Head: Normocephalic. NECK: Trachea midline.  THROAT: Mucous membranes pink and moist.  RESPIRATORY: Chest wall symmetrical. Respirations even and non-labored. CARDIAC: Peripheral pulses 2+ bilaterally.  MSK: Muscle tone and strength appropriate for age.  EXTREMITIES: Without clubbing, cyanosis, or edema.  NEUROLOGIC: No motor or sensory deficits. Steady, even gait. C2-C12 intact.  PSYCH/MENTAL STATUS: Alert, oriented x 3. Cooperative, appropriate mood and affect.   Health Maintenance Due  Topic Date Due   OPHTHALMOLOGY EXAM  11/28/2018   Mammogram  11/05/2022   COVID-19 Vaccine (5 - 2025-26 season) 11/05/2023    No results found for any visits on 12/27/23.  The 10-year  ASCVD risk score (Arnett DK, et al., 2019) is: 1.5%  Assessment & Plan:  1. Type 2 diabetes mellitus without complication, without long-term current use of insulin (HCC) Controlled. Victoza increased to 1.8mg  daily and Rx sent to pt's pharmacy. Referral to diabetic nutritionist also placed.  - liraglutide (VICTOZA) 18 MG/3ML SOPN; Inject 1.8 mg into the skin daily.  Dispense: 9 mL; Refill: 2 - Ambulatory referral to diabetic education  2. Mixed hyperlipidemia Controlled. Most recent lipid panel results discussed with pt. Triglycerides are improving from previous, likely related to Victoza use x20mo. Pt encouraged to consume a low fat diet.   3. CNS vasculitis Pt will continue supplemental O2 at home.   4. Immunization due (Primary) Flu vaccine updated in the office today.  - Flu vaccine trivalent PF, 6mos and older(Flulaval,Afluria,Fluarix,Fluzone)  5. Encounter for screening mammogram for malignant neoplasm of breast Reminded pt that she is due for mammogram. Pt verbalized understanding and states she will stop by radiology on her way out of the office today.     Meds ordered this encounter  Medications   liraglutide (VICTOZA) 18 MG/3ML SOPN    Sig: Inject 1.8 mg into the skin daily.    Dispense:  9 mL    Refill:  2   Lab Orders  No laboratory test(s) ordered today   No images are attached to the encounter or orders placed in the encounter.  Return in about 6 months (around 06/26/2024) for Diabetes and AE .    Patient to reach out to office if new, worrisome, or unresolved symptoms arise or if no improvement in patient's condition. Patient verbalized understanding and is agreeable to treatment plan. All questions answered to patient's satisfaction.   Treatment plan and recommendation(s) reviewed by supervising preceptor, Thersia CLEMENTEEN Stark, FNP-C, prior to clinic discharge.   Rosina Ada, BSN, RN  DNP Student

## 2023-12-30 ENCOUNTER — Other Ambulatory Visit: Payer: Self-pay | Admitting: Neurology

## 2023-12-30 DIAGNOSIS — F419 Anxiety disorder, unspecified: Secondary | ICD-10-CM

## 2023-12-31 ENCOUNTER — Other Ambulatory Visit: Payer: Self-pay | Admitting: Neurology

## 2023-12-31 DIAGNOSIS — F419 Anxiety disorder, unspecified: Secondary | ICD-10-CM

## 2023-12-31 MED ORDER — CLONAZEPAM 1 MG PO TABS: 1.0000 mg | ORAL_TABLET | Freq: Two times a day (BID) | ORAL | 5 refills | Status: AC

## 2023-12-31 NOTE — Telephone Encounter (Signed)
 Last seen on 11/06/23 Follow up scheduled on 06/12/24   Dispensed Days Supply Quantity Provider Pharmacy  CLONAZEPAM  1 MG TABLET 11/18/2023 30 60 each Sater, Charlie LABOR, MD [facility] [phone] - [facility]...      Rx pending to be signed

## 2023-12-31 NOTE — Telephone Encounter (Signed)
 Dr. Vear- pt last refilled 11/18/23 #60. You last sent in rx 05/31/23 #60, 5 refills. I do not see mention in last note about you continuing to refill/rx'ing. Ok to refill? If so, pt also requesting 90 days supply. Please e-scribe

## 2024-01-07 ENCOUNTER — Ambulatory Visit (HOSPITAL_BASED_OUTPATIENT_CLINIC_OR_DEPARTMENT_OTHER): Admitting: Family Medicine

## 2024-01-09 ENCOUNTER — Ambulatory Visit (HOSPITAL_COMMUNITY): Admitting: Physician Assistant

## 2024-01-09 ENCOUNTER — Encounter (HOSPITAL_COMMUNITY): Payer: Self-pay | Admitting: Physician Assistant

## 2024-01-09 DIAGNOSIS — F4310 Post-traumatic stress disorder, unspecified: Secondary | ICD-10-CM

## 2024-01-09 DIAGNOSIS — F411 Generalized anxiety disorder: Secondary | ICD-10-CM | POA: Diagnosis not present

## 2024-01-09 DIAGNOSIS — F332 Major depressive disorder, recurrent severe without psychotic features: Secondary | ICD-10-CM | POA: Diagnosis not present

## 2024-01-09 MED ORDER — TRAZODONE HCL 50 MG PO TABS: 50.0000 mg | ORAL_TABLET | Freq: Every day | ORAL | 1 refills | Status: AC

## 2024-01-09 MED ORDER — SERTRALINE HCL 100 MG PO TABS: 100.0000 mg | ORAL_TABLET | Freq: Every day | ORAL | 1 refills | Status: DC

## 2024-01-09 NOTE — Progress Notes (Signed)
 [facility] MD/PA/NP OP Progress Note  01/09/2024 8:32 PM Angel Rodriguez  MRN:  [id number]  Chief Complaint:  Chief Complaint  Patient presents with   Follow-up   Medication Management   HPI:   Angel Rodriguez. [name] is a 48 year old female with a past psychiatric history significant for major depressive disorder (severe episode, without psychotic features), PTSD, and generalized anxiety disorder who presents to [facility] for follow-up and medication management.  Provider last spoke with this patient on 12/01/2023.  During the last encounter, patient was being managed on the following psychiatric medications.  Sertraline 50 mg daily Trazodone 50 mg at bedtime as needed  Since the last encounter, patient reports that she has been doing relatively well.  She reports that she has been taking her medications regularly and denies experiencing any adverse side effects.  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  She reports that she still occasionally experiences crying spells.  She rates her depression a 4-5 out of 10 with 10 being most severe.  Patient endorses depressive episodes every day with symptoms lasting all day.  Patient endorses the following depressive symptoms: feelings of sadness, lack of motivation, decreased concentration (improving), irritability (improving), feelings of guilt/worthlessness, and hopelessness.  Patient denies decreased energy.  Patient also endorses anxiety and rates her anxiety a 5 out of 10.  Patient's main stressor revolves around her daughter, who is 54, dating.  Patient is interested in adjusting her medication dosages stating that the medication appeared to be working in the beginning when starting them, but has since lost its potency over time.  A PHQ-9 screen was performed with the patient scoring an 18.  A GAD-7 screen was also performed with the  patient scoring an 18.  Patient is alert and oriented x 4, calm, cooperative, and fully engaged in conversation during the encounter.  Patient endorses neutral mood.  Patient exhibits depressed mood with appropriate affect.  Patient denies suicidal or homicidal ideations.  She further denies auditory or visual hallucinations and does not appear to be responding to internal/external stimuli.  Patient endorses fair sleep and receives on average 6 to 7 hours of sleep per night.  Patient endorses fair appetite and eats on average 1-2 meals per day.  Patient denies alcohol consumption, tobacco use, or illicit drug use.  Visit Diagnosis:    ICD-10-CM   1. Severe episode of recurrent major depressive disorder, without psychotic features (HCC)  F33.2 traZODone (DESYREL) 50 MG tablet    sertraline (ZOLOFT) 100 MG tablet    2. GAD (generalized anxiety disorder)  F41.1 sertraline (ZOLOFT) 100 MG tablet    3. PTSD (post-traumatic stress disorder)  F43.10 sertraline (ZOLOFT) 100 MG tablet      Past Psychiatric History:  Patient endorses a past psychiatric history significant for depression and anxiety   Patient denies a past history of hospitalization due to mental health but states that she has completed intensive outpatient [facility] from 11/28/2020 to 12/13/2020.   Patient was previously seen by Dr. Lynnetta from 06/06/2018 to 04/02/2020   Patient denies a past history of suicide attempt but states that she has had thoughts in the past.  Patient reports that she last had suicidal thoughts today.   Patient denies a past history of homicide attempt  Past Medical History:  Past Medical History:  Diagnosis Date   Anxiety    Blind right eye 2009  swelling & pressure   Cluster headaches    started after seizure and migraines   Complication of anesthesia    trouble with short term memory after surgeries   COVID-19    Depression    Diastolic dysfunction    a. 01/2021 Echo: EF 60-65%, no rwma, GrII  DD, nl RV fxn, mildly dil LA. Triv AI.   Fibroids    s/p TLH, bilateral salpingectomy   GERD (gastroesophageal reflux disease)    patient thinks due to medications   History of anemia    prior to hysterectomy   History of pneumonia    History of trichomoniasis    Seizures (HCC) x 1 2-3 yrs ago none since   once saw neurologist, full body brain MRI and another 6 months lster   Status post right oophorectomy    Thrombocytopenia    sees [facility] with Heme Onc   Vasculitis    neuro    Past Surgical History:  Procedure Laterality Date   CYSTOSCOPY N/A 07/31/2017   Procedure: CYSTOSCOPY;  Surgeon: Cleotilde Ronal RAMAN, MD;  Location: [facility];  Service: Gynecology;  Laterality: N/A;   ENDOMETRIAL ABLATION     EYE SURGERY     laser - removed blood from right eye   HYSTEROSCOPY WITH D & C     LAPAROSCOPY N/A 09/19/2017   Procedure: LAPAROSCOPY DIAGNOSTIC WITH RIGHT OOPHERECTOMY, LYSIS OF ADHESIONS;  Surgeon: Jertson, Jill Evelyn, MD;  Location: [facility];  Service: Gynecology;  Laterality: N/A;   OMENTECTOMY N/A 11/21/2017   Procedure: OMENTECTOMY AND PELVIC WASHINGS;  Surgeon: Anitra Freddy NOVAK, MD;  Location: [facility];  Service: Gynecology;  Laterality: N/A;   ROBOTIC ASSISTED SALPINGO OOPHERECTOMY Left 11/21/2017   Procedure: XI ROBOTIC ASSISTED LEFT SALPINGO OOPHORECTOMY;  Surgeon: Anitra Freddy NOVAK, MD;  Location: [facility];  Service: Gynecology;  Laterality: Left;   TOTAL LAPAROSCOPIC HYSTERECTOMY WITH SALPINGECTOMY Bilateral 07/31/2017   Procedure: TOTAL LAPAROSCOPIC HYSTERECTOMY WITH SALPINGECTOMY;  Surgeon: Cleotilde Ronal RAMAN, MD;  Location: [facility];  Service: Gynecology;  Laterality: Bilateral;  20 week size uterus/ Alexis bag in room/ need 4.5 hours   TUBAL LIGATION     WISDOM TOOTH EXTRACTION      Family Psychiatric History:  Son - bipolar disorder   Family history of suicide attempt: Patient denies Family history of homicide attempt: Patient denies Family history of substance abuse: Patient  reports that her son used to use Roxie and heroin.  She reports that he has been clean for 3 years now.  Family History:  Family History  Problem Relation Age of Onset   Sleep apnea Mother    Alcoholism Father    Cirrhosis Father    Alcohol abuse Father    Cancer Maternal Grandmother        unsure of type; died of other causes   Heart disease Maternal Aunt    Drug abuse Cousin     Social History:  Social History   Socioeconomic History   Marital status: Single    Spouse name: Not on file   Number of children: 3   Years of education: Not on file   Highest education level: Associate degree: occupational, scientist, product/process development, or vocational program  Occupational History   Not on file  Tobacco Use   Smoking status: Former    Current packs/day: 0.00    Types: Cigarettes, E-cigarettes    Start date: 05/05/1998    Quit date: 05/05/2018    Years since quitting: 5.6   Smokeless tobacco:  Never  Vaping Use   Vaping status: Former  Substance and Sexual Activity   Alcohol use: Not Currently   Drug use: No   Sexual activity: Yes    Birth control/protection: Surgical    Comment: hysterectomy  Other Topics Concern   Not on file  Social History Narrative   Not on file   Social Drivers of Health   Financial Resource Strain: Medium Risk (04/12/2023)   Overall Financial Resource Strain (CARDIA)    Difficulty of Paying Living Expenses: Somewhat hard  Food Insecurity: Food Insecurity Present (04/12/2023)   Hunger Vital Sign    Worried About Running Out of Food in the Last Year: Often true    Ran Out of Food in the Last Year: Sometimes true  Transportation Needs: No Transportation Needs (04/12/2023)   PRAPARE - Administrator, Civil Service (Medical): No    Lack of Transportation (Non-Medical): No  Physical Activity: Inactive (04/12/2023)   Exercise Vital Sign    Days of Exercise per Week: 0 days    Minutes of Exercise per Session: 0 min  Stress: Stress Concern Present (04/12/2023)    [employer] of Occupational Health - Occupational Stress Questionnaire    Feeling of Stress : Very much  Social Connections: Socially Isolated (04/12/2023)   Social Connection and Isolation Panel    Frequency of Communication with Friends and Family: Never    Frequency of Social Gatherings with Friends and Family: Never    Attends Religious Services: Never    Database Administrator or Organizations: No    Attends Banker Meetings: Never    Marital Status: Never married    Allergies:  Allergies  Allergen Reactions   Ivp Dye [Iodinated Contrast Media] Hives, Itching and Swelling   Bee Venom Swelling   Buspar  [Buspirone ] Other (See Comments)    Metabolic Disorder Labs: Lab Results  Component Value Date   HGBA1C 6.4 (H) 11/30/2023   MPG 108.28 03/14/2018   No results found for: PROLACTIN Lab Results  Component Value Date   CHOL 163 11/30/2023   TRIG 155 (H) 11/30/2023   HDL 54 11/30/2023   CHOLHDL 3.0 11/30/2023   VLDL 34 10/13/2021   LDLCALC 82 11/30/2023   LDLCALC 114 (H) 07/06/2023   Lab Results  Component Value Date   TSH 1.030 02/17/2021   TSH 1.730 07/09/2020    Therapeutic Level Labs: No results found for: LITHIUM No results found for: VALPROATE No results found for: CBMZ  Current Medications: Current Outpatient Medications  Medication Sig Dispense Refill   Accu-Chek Softclix Lancets lancets Use as instructed 100 each 12   acetaminophen -codeine  (TYLENOL  #3) 300-30 MG tablet TAKE ONE TABLET BY MOUTH UP TO FOUR TIMES A DAY AS NEEDED 28 tablet 2   Ascorbic Acid (VITAMIN C) 1000 MG tablet Take 1,000 mg by mouth daily.     atorvastatin  (LIPITOR) 80 MG tablet Take 1 tablet (80 mg total) by mouth daily. 30 tablet 11   atropine  1 % ophthalmic solution INSTILL 1 DROP INTO AFFECTED EYE UP TO 3 TIMES DAILY 5 mL 3   b complex vitamins capsule Take 1 capsule by mouth daily.     Blood Glucose Monitoring Suppl (ACCU-CHEK GUIDE ME) w/Device KIT USE  TO CHECK FASTING BLOOD SUGAR AND 2 HOURS AFTER LARGEST MEAL 1 kit 0   buPROPion  (WELLBUTRIN  XL) 150 MG 24 hr tablet Take 1 tablet (150 mg total) by mouth daily. 90 tablet 1   clonazePAM  (KLONOPIN ) 1  MG tablet Take 1 tablet (1 mg total) by mouth 2 (two) times daily. 60 tablet 5   dorzolamide -timolol  (COSOPT ) 22.3-6.8 MG/ML ophthalmic solution Place 1 drop into the right eye 2 (two) times daily.      ezetimibe  (ZETIA ) 10 MG tablet Take 1 tablet (10 mg total) by mouth daily. 90 tablet 3   fluticasone  (FLONASE ) 50 MCG/ACT nasal spray USE 1 -2 SPRAYS INTO BOTH NOSTRILS DAILY AS NEEDED FOR ALLERGIES 16 mL 5   glucose blood (ACCU-CHEK GUIDE) test strip Use to check fasting glucose and 2 hours after largest meal 100 each 12   Insulin Pen Needle (PEN NEEDLES) 32G X 4 MM MISC Use as directed for injecting insulin 100 each 2   Lacosamide  100 MG TABS TAKE 1 TABLET BY MOUTH TWICE A DAY 60 tablet 5   latanoprost  (XALATAN ) 0.005 % ophthalmic solution Place 1 drop into both eyes at bedtime.     liraglutide (VICTOZA) 18 MG/3ML SOPN Inject 1.8 mg into the skin daily. 9 mL 2   ondansetron  (ZOFRAN -ODT) 4 MG disintegrating tablet Take 1 tablet (4 mg total) by mouth every 8 (eight) hours as needed for nausea or vomiting. 20 tablet 11   pilocarpine  (SALAGEN ) 5 MG tablet Take 1 tablet (5 mg total) by mouth 2 (two) times daily. 60 tablet 11   RUXIENCE  500 MG/50ML injection 1000mg  IV once every 24 weeks 100 mL 1   sertraline (ZOLOFT) 100 MG tablet Take 1 tablet (100 mg total) by mouth daily. 30 tablet 1   SUMAtriptan  (IMITREX ) 100 MG tablet Take 1 tablet (100 mg total) by mouth once as needed for migraine. May repeat in 2 hours if headache persists or recurs. 9 tablet 5   traZODone (DESYREL) 50 MG tablet Take 1 tablet (50 mg total) by mouth at bedtime. 30 tablet 1   Ubrogepant  (UBRELVY ) 100 MG TABS One po every day prn migraine 10 tablet 11   verapamil  (CALAN -SR) 240 MG CR tablet Take 1 tablet (240 mg total) by mouth at  bedtime. 90 tablet 2   Current Facility-Administered Medications  Medication Dose Route Frequency Provider Last Rate Last Admin   bupivacaine (PF) (MARCAINE ) 0.5 % injection 2 mL  2 mL Other Once Sater, Charlie LABOR, MD       lidocaine  (XYLOCAINE ) 2 % (with pres) injection 40 mg  2 mL Intradermal Once Sater, Charlie LABOR, MD         Musculoskeletal: Strength & Muscle Tone: within normal limits Gait & Station: normal Patient leans: N/A  Psychiatric Specialty Exam: Review of Systems  Psychiatric/Behavioral:  Positive for dysphoric mood and sleep disturbance. Negative for decreased concentration, hallucinations, self-injury and suicidal ideas. The patient is nervous/anxious. The patient is not hyperactive.     Blood pressure 134/80, pulse 79, temperature 98.1 F (36.7 C), height 5' (1.524 m), weight 178 lb 9.6 oz (81 kg), last menstrual period 05/02/2017.Body mass index is 34.88 kg/m.  General Appearance: Casual  Eye Contact:  Good  Speech:  Clear and Coherent and Normal Rate  Volume:  Normal  Mood:  Anxious and Depressed  Affect:  Congruent  Thought Process:  Coherent, Goal Directed, and Descriptions of Associations: Intact  Orientation:  Full (Time, Place, and Person)  Thought Content: WDL   Suicidal Thoughts:  No  Homicidal Thoughts:  No  Memory:  Immediate;   Good Recent;   Good Remote;   Good  Judgement:  Good  Insight:  Good  Psychomotor Activity:  Normal  Concentration:  Concentration:  Good and Attention Span: Good  Recall:  Good  Fund of Knowledge: Good  Language: Good  Akathisia:  No  Handed:  Right  AIMS (if indicated): not done  Assets:  Communication Skills Desire for Improvement Housing Social Support Transportation Vocational/Educational  ADL's:  Intact  Cognition: WNL  Sleep:  Fair   Screenings: GAD-7    Flowsheet Row Clinical Support from 01/09/2024 in [facility] Office Visit from 10/03/2023 in [facility] at [facility] Clinical Support from 08/14/2023 in [facility] Clinical Support from 07/12/2023 in [facility] Clinical Support from 05/29/2023 in [facility]  Total GAD-7 Score 18 14 15 12 16    PHQ2-9    Flowsheet Row Clinical Support from 01/09/2024 in [facility] Office Visit from 10/03/2023 in [facility] at [facility] Clinical Support from 08/14/2023 in [facility] Clinical Support from 07/12/2023 in [facility] Clinical Support from 05/29/2023 in [facility]  PHQ-2 Total Score 4 3 3 6 5   PHQ-9 Total Score 18 20 15 26 21    Flowsheet Row Clinical Support from 01/09/2024 in [facility] Clinical Support from 08/14/2023 in [facility] Clinical Support from 07/12/2023 in [facility]  C-SSRS RISK CATEGORY No Risk No Risk Low Risk     Assessment and Plan:   Skila Rollins. Chayna Surratt is a 47 year old female with a past psychiatric history significant for major depressive disorder (severe episode, without psychotic features), PTSD, and generalized anxiety disorder who presents to [facility] for follow-up and medication management.  Patient is currently being managed on the following psychiatric medications:  Sertraline 50 mg daily Trazodone 50 mg at bedtime as needed  Patient reports that she has been taking her medication regularly and denies experiencing any adverse side effects.  She reports that the medications appear to be effective in managing her symptoms; however, she reports that the medications appear to have lost their potency since starting them.  Patient continues to endorse depression as well as anxiety.  A PHQ-9  screen was performed with the patient scoring an 18.  A GAD-7 screen was also performed with the patient scoring an 18.  Provider recommended increasing patient's sertraline dosage from 50 mg to 100 mg daily for the management of her depressive symptoms and anxiety.  Patient was agreeable to recommendation.  Patient to continue taking her trazodone as prescribed.  Patient's medications to be e-prescribed to pharmacy of choice.  A [place] Suicide Severity Rating Scale was performed with the patient being considered no risk.  Patient denies suicidal ideations and is able to contract for safety at this time.  Collaboration of Care: Collaboration of Care: Medication Management AEB provider managing patient's psychiatric medications, Primary Care Provider AEB patient being seen by a primary care provider, Psychiatrist AEB patient being followed by mental health provider at this facility, and Other provider involved in patient's care AEB patient being seen by neurology  Patient/Guardian was advised Release of Information must be obtained prior to any record release in order to collaborate their care with an outside provider. Patient/Guardian was advised if they have not already done so to contact the registration department to sign all necessary forms in order for us  to release information regarding their care.   Consent:  Patient/Guardian gives verbal consent for treatment and assignment of benefits for services provided during this visit. Patient/Guardian expressed understanding and agreed to proceed.   1. Severe episode of recurrent major depressive disorder, without psychotic features (HCC)  - traZODone (DESYREL) 50 MG tablet; Take 1 tablet (50 mg total) by mouth at bedtime.  Dispense: 30 tablet; Refill: 1 - sertraline (ZOLOFT) 100 MG tablet; Take 1 tablet (100 mg total) by mouth daily.  Dispense: 30 tablet; Refill: 1  2. GAD (generalized anxiety disorder)  - sertraline (ZOLOFT) 100 MG tablet; Take  1 tablet (100 mg total) by mouth daily.  Dispense: 30 tablet; Refill: 1  3. PTSD (post-traumatic stress disorder)  - sertraline (ZOLOFT) 100 MG tablet; Take 1 tablet (100 mg total) by mouth daily.  Dispense: 30 tablet; Refill: 1  Patient to follow up in 6 weeks Provider spent a total of 17 minutes with the patient/reviewing patient's chart  Reginia FORBES Bolster, PA 01/09/2024, 8:32 PM

## 2024-01-16 ENCOUNTER — Other Ambulatory Visit (HOSPITAL_COMMUNITY): Payer: Self-pay | Admitting: Physician Assistant

## 2024-01-16 DIAGNOSIS — F332 Major depressive disorder, recurrent severe without psychotic features: Secondary | ICD-10-CM

## 2024-01-17 ENCOUNTER — Encounter: Payer: Self-pay | Admitting: *Deleted

## 2024-01-17 NOTE — Progress Notes (Signed)
 Angel Rodriguez                                          MRN: [id number]   01/17/2024   The VBCI Quality Team Specialist reviewed this patient medical record for the purposes of chart review for care gap closure. The following were reviewed: abstraction for care gap closure-kidney health evaluation for diabetes:eGFR  and uACR.    VBCI Quality Team

## 2024-01-17 NOTE — Progress Notes (Unsigned)
 Angel Rodriguez                                          MRN: [id number]   01/17/2024   The VBCI Quality Team Specialist reviewed this patient medical record for the purposes of chart review for care gap closure. The following were reviewed: {CHL AMB VBCI QUALITY SPECIALIST REASON FOR REVIEW:[id number]}.    VBCI Quality Team

## 2024-01-19 ENCOUNTER — Other Ambulatory Visit: Payer: Self-pay | Admitting: Neurology

## 2024-01-21 ENCOUNTER — Telehealth (HOSPITAL_BASED_OUTPATIENT_CLINIC_OR_DEPARTMENT_OTHER): Payer: Self-pay | Admitting: *Deleted

## 2024-01-21 NOTE — Telephone Encounter (Signed)
 Copied from CRM #[id number]. Topic: Clinical - Prescription Issue >> Jan 21, 2024 11:52 AM Joesph NOVAK wrote: Reason for CRM: patient is having issues finding a pharmacy that has liraglutide (VICTOZA) 18 MG/3ML SOPN. She was advised that there is a geneticist, molecular. She has two pills left until she runs out. She does not want to go back to metformin . Can someone follow up with patient in regards to her medication?

## 2024-01-21 NOTE — Telephone Encounter (Signed)
 Last seen on 11/06/23 Follow up scheduled on 06/12/24   Dispensed Days Supply Quantity Provider Pharmacy  verapamil  (CALAN -SR) CR tablet 08/30/2023 30 30 Sater, Charlie LABOR, MD      I didn't see medication mentioned in last office note to continue taking  Rx pending

## 2024-01-21 NOTE — Telephone Encounter (Signed)
 Routing to [place] for review to determine what might need to be done.

## 2024-01-22 ENCOUNTER — Other Ambulatory Visit (HOSPITAL_BASED_OUTPATIENT_CLINIC_OR_DEPARTMENT_OTHER): Payer: Self-pay | Admitting: Family Medicine

## 2024-01-22 ENCOUNTER — Other Ambulatory Visit (HOSPITAL_BASED_OUTPATIENT_CLINIC_OR_DEPARTMENT_OTHER): Payer: Self-pay

## 2024-01-22 DIAGNOSIS — E119 Type 2 diabetes mellitus without complications: Secondary | ICD-10-CM

## 2024-01-22 MED ORDER — LIRAGLUTIDE 18 MG/3ML ~~LOC~~ SOPN
1.8000 mg | PEN_INJECTOR | Freq: Every day | SUBCUTANEOUS | 2 refills | Status: DC
  Filled 2024-01-22: qty 9, 30d supply, fill #0
  Filled 2024-02-16: qty 9, 30d supply, fill #1
  Filled 2024-03-24: qty 9, 30d supply, fill #2
  Filled 2024-03-27 (×2): qty 3, 10d supply, fill #2

## 2024-01-22 NOTE — Telephone Encounter (Signed)
 Message sent to pt making her aware

## 2024-01-28 NOTE — Progress Notes (Deleted)
  Start: *** end: ***  Patient is here today ***. Patient would like to learn ***. Patient lives with ***.  *** shopping and cooking. Pt reports eating out *** times weekly.  Pt reports making the following changes including ***.  All Pt's questions were answered during this encounter.    History includes:  *** Medications include:  *** Labs noted:  ***   6.4%, victoza

## 2024-02-01 ENCOUNTER — Telehealth: Payer: Self-pay

## 2024-02-01 ENCOUNTER — Other Ambulatory Visit (HOSPITAL_COMMUNITY): Payer: Self-pay

## 2024-02-01 NOTE — Telephone Encounter (Signed)
 Pharmacy Patient Advocate Encounter   Received notification from CoverMyMeds that prior authorization for Acetaminophen -Codeine  300-30MG  tablets  is required/requested.   Insurance verification completed.   The patient is insured through [facility].   Per test claim: PA required; PA started via CoverMyMeds. KEY [id number] . Waiting for clinical questions to populate.

## 2024-02-04 NOTE — Telephone Encounter (Signed)
 Pharmacy Patient Advocate Encounter  Received notification from [employer] [facility] that Prior Authorization for Acetaminophen -Codeine  300-30MG  tablets  has been APPROVED from 02/04/2024 to 08/02/2024   PA #/Case ID/Reference #: [id number]

## 2024-02-08 ENCOUNTER — Encounter: Attending: Family Medicine | Admitting: Dietician

## 2024-02-08 DIAGNOSIS — E119 Type 2 diabetes mellitus without complications: Secondary | ICD-10-CM

## 2024-02-12 ENCOUNTER — Other Ambulatory Visit (HOSPITAL_COMMUNITY): Payer: Self-pay | Admitting: Physician Assistant

## 2024-02-12 DIAGNOSIS — F411 Generalized anxiety disorder: Secondary | ICD-10-CM

## 2024-02-12 DIAGNOSIS — F4310 Post-traumatic stress disorder, unspecified: Secondary | ICD-10-CM

## 2024-02-12 DIAGNOSIS — F332 Major depressive disorder, recurrent severe without psychotic features: Secondary | ICD-10-CM

## 2024-02-16 ENCOUNTER — Other Ambulatory Visit (HOSPITAL_COMMUNITY): Payer: Self-pay | Admitting: Physician Assistant

## 2024-02-16 DIAGNOSIS — F332 Major depressive disorder, recurrent severe without psychotic features: Secondary | ICD-10-CM

## 2024-02-18 ENCOUNTER — Other Ambulatory Visit: Payer: Self-pay

## 2024-02-18 ENCOUNTER — Encounter: Payer: Self-pay | Admitting: Pharmacist

## 2024-02-18 ENCOUNTER — Other Ambulatory Visit (HOSPITAL_COMMUNITY): Payer: Self-pay

## 2024-02-20 ENCOUNTER — Telehealth (HOSPITAL_BASED_OUTPATIENT_CLINIC_OR_DEPARTMENT_OTHER): Payer: Self-pay | Admitting: Family Medicine

## 2024-02-20 DIAGNOSIS — R519 Headache, unspecified: Secondary | ICD-10-CM | POA: Diagnosis not present

## 2024-02-20 DIAGNOSIS — F17290 Nicotine dependence, other tobacco product, uncomplicated: Secondary | ICD-10-CM | POA: Diagnosis not present

## 2024-02-20 DIAGNOSIS — H5440 Blindness, one eye, unspecified eye: Secondary | ICD-10-CM | POA: Diagnosis not present

## 2024-02-20 DIAGNOSIS — R110 Nausea: Secondary | ICD-10-CM | POA: Diagnosis not present

## 2024-02-20 DIAGNOSIS — H5711 Ocular pain, right eye: Secondary | ICD-10-CM | POA: Diagnosis not present

## 2024-02-20 NOTE — Telephone Encounter (Signed)
 Copied from CRM #[id number]. Topic: Clinical - Order For Equipment >> Feb 20, 2024 11:22 AM Berwyn MATSU wrote: Reason for CRM:  [facility] called in to advise that they need a new order for oxygen due to the dx code is no g47.36 is no longer accepted for o2.  R09.02 is acute per [facility] they would need a new diagnosis code.  Fax: [phone]  May you please advise.

## 2024-02-21 DIAGNOSIS — H4051X3 Glaucoma secondary to other eye disorders, right eye, severe stage: Secondary | ICD-10-CM | POA: Insufficient documentation

## 2024-02-22 ENCOUNTER — Telehealth (HOSPITAL_COMMUNITY): Admitting: Physician Assistant

## 2024-02-22 ENCOUNTER — Encounter (HOSPITAL_COMMUNITY): Payer: Self-pay | Admitting: Physician Assistant

## 2024-02-22 DIAGNOSIS — F411 Generalized anxiety disorder: Secondary | ICD-10-CM

## 2024-02-22 DIAGNOSIS — F332 Major depressive disorder, recurrent severe without psychotic features: Secondary | ICD-10-CM

## 2024-02-22 DIAGNOSIS — F4310 Post-traumatic stress disorder, unspecified: Secondary | ICD-10-CM

## 2024-02-22 MED ORDER — SERTRALINE HCL 100 MG PO TABS: 100.0000 mg | ORAL_TABLET | Freq: Every day | ORAL | 0 refills | Status: DC

## 2024-02-22 MED ORDER — TRAZODONE HCL 50 MG PO TABS: 50.0000 mg | ORAL_TABLET | Freq: Every day | ORAL | 0 refills | Status: AC

## 2024-02-22 MED ORDER — BUPROPION HCL ER (XL) 150 MG PO TB24: 150.0000 mg | ORAL_TABLET | Freq: Every day | ORAL | 0 refills | Status: AC

## 2024-02-22 NOTE — Progress Notes (Signed)
 [facility] MD/PA/NP OP Progress Note  Virtual Visit via Video Note  I connected with Angel Rodriguez on 02/22/2024 at  4:30 PM EST by a video enabled telemedicine application and verified that I am speaking with the correct person using two identifiers.  Location: Patient: Home Provider: Clinic   I discussed the limitations of evaluation and management by telemedicine and the availability of in person appointments. The patient expressed understanding and agreed to proceed.  Follow Up Instructions:  I discussed the assessment and treatment plan with the patient. The patient was provided an opportunity to ask questions and all were answered. The patient agreed with the plan and demonstrated an understanding of the instructions.   The patient was advised to call back or seek an in-person evaluation if the symptoms worsen or if the condition fails to improve as anticipated.  I provided 13 minutes of non-face-to-face time during this encounter.  Reginia FORBES Bolster, PA    02/22/2024 8:02 PM Mitzie CHRISTELLA Mania  MRN:  [id number]  Chief Complaint:  Chief Complaint  Patient presents with   Follow-up   Medication Refill   HPI:   Angel Rodriguez. Angel Rodriguez is a 48 year old female with a past psychiatric history significant for major depressive disorder (severe episode, without psychotic features), PTSD, and generalized anxiety disorder who presents to [facility] via virtual video visit for follow-up and medication management.  Patient is currently being managed on the following psychiatric medications:  Sertraline  100 mg daily Trazodone  50 mg at bedtime as needed  Patient reports that her life has been good since the last encounter.  She reports that she is losing weight and that her new job is going really well.  She still continues to endorse depression and rates her depression an 8 out of 10 with 10 being most severe.  Patient endorses depressive episodes 1-2  times per month.  Patient attributes her depressive episodes to her current eye issues.  Patient endorses the following depressive symptoms: feelings of sadness (improving), crying spells (improving), decreased energy, irritability, feelings of guilt/worthlessness, and hopelessness.  Patient denies decreased concentration.  Patient also endorses anxiety and rates her anxiety a 6 or 7 out of 10.  Patient endorses stressors related to her eye issues. A PHQ-9 screen was performed with the patient scoring a 19. A GAD-7 screen was also performed with the patient scoring a 21.  Patient is alert and oriented x 4, calm, cooperative, and fully engaged in conversation during the encounter. Patient endorses ok mood. Patient denies suicidal or homicidal ideations. She further denies auditory or visual hallucinations and does not appear to be responding to internal/external stimuli. Patient endorses good sleep and receives on average 6 to 8 hours of sleep per night. Patient endorses fair appetite and eats on average 1 - 2 meals per day. Patient denies alcohol consumption, tobacco use, or illicit drug use.  Visit Diagnosis:    ICD-10-CM   1. Severe episode of recurrent major depressive disorder, without psychotic features (HCC)  F33.2 traZODone  (DESYREL ) 50 MG tablet    sertraline  (ZOLOFT ) 100 MG tablet    buPROPion  (WELLBUTRIN  XL) 150 MG 24 hr tablet    2. GAD (generalized anxiety disorder)  F41.1 sertraline  (ZOLOFT ) 100 MG tablet    3. PTSD (post-traumatic stress disorder)  F43.10 sertraline  (ZOLOFT ) 100 MG tablet      Past Psychiatric History:  Patient endorses a past psychiatric history significant for depression and anxiety   Patient denies a past  history of hospitalization due to mental health but states that she has completed intensive outpatient [facility] from 11/28/2020 to 12/13/2020.   Patient was previously seen by Dr. Lynnetta from 06/06/2018 to 04/02/2020   Patient denies a past history of suicide  attempt but states that she has had thoughts in the past.  Patient reports that she last had suicidal thoughts today.   Patient denies a past history of homicide attempt  Past Medical History:  Past Medical History:  Diagnosis Date   Anxiety    Blind right eye 2009   swelling & pressure   Cluster headaches    started after seizure and migraines   Complication of anesthesia    trouble with short term memory after surgeries   COVID-19    Depression    Diastolic dysfunction    a. 01/2021 Echo: EF 60-65%, no rwma, GrII DD, nl RV fxn, mildly dil LA. Triv AI.   Fibroids    s/p TLH, bilateral salpingectomy   GERD (gastroesophageal reflux disease)    patient thinks due to medications   History of anemia    prior to hysterectomy   History of pneumonia    History of trichomoniasis    Seizures (HCC) x 1 2-3 yrs ago none since   once saw neurologist, full body brain MRI and another 6 months lster   Status post right oophorectomy    Thrombocytopenia    sees [facility] with Heme Onc   Vasculitis    neuro    Past Surgical History:  Procedure Laterality Date   CYSTOSCOPY N/A 07/31/2017   Procedure: CYSTOSCOPY;  Surgeon: Cleotilde Ronal RAMAN, MD;  Location: [facility];  Service: Gynecology;  Laterality: N/A;   ENDOMETRIAL ABLATION     EYE SURGERY     laser - removed blood from right eye   HYSTEROSCOPY WITH D & C     LAPAROSCOPY N/A 09/19/2017   Procedure: LAPAROSCOPY DIAGNOSTIC WITH RIGHT OOPHERECTOMY, LYSIS OF ADHESIONS;  Surgeon: Jertson, Jill Evelyn, MD;  Location: [facility];  Service: Gynecology;  Laterality: N/A;   OMENTECTOMY N/A 11/21/2017   Procedure: OMENTECTOMY AND PELVIC WASHINGS;  Surgeon: Anitra Freddy NOVAK, MD;  Location: [facility];  Service: Gynecology;  Laterality: N/A;   ROBOTIC ASSISTED SALPINGO OOPHERECTOMY Left 11/21/2017   Procedure: XI ROBOTIC ASSISTED LEFT SALPINGO OOPHORECTOMY;  Surgeon: Anitra Freddy NOVAK, MD;  Location: [facility];  Service: Gynecology;  Laterality: Left;   TOTAL  LAPAROSCOPIC HYSTERECTOMY WITH SALPINGECTOMY Bilateral 07/31/2017   Procedure: TOTAL LAPAROSCOPIC HYSTERECTOMY WITH SALPINGECTOMY;  Surgeon: Cleotilde Ronal RAMAN, MD;  Location: [facility];  Service: Gynecology;  Laterality: Bilateral;  20 week size uterus/ Alexis bag in room/ need 4.5 hours   TUBAL LIGATION     WISDOM TOOTH EXTRACTION      Family Psychiatric History:  Son - bipolar disorder   Family history of suicide attempt: Patient denies Family history of homicide attempt: Patient denies Family history of substance abuse: Patient reports that her son used to use Roxie and heroin.  She reports that he has been clean for 3 years now.  Family History:  Family History  Problem Relation Age of Onset   Sleep apnea Mother    Alcoholism Father    Cirrhosis Father    Alcohol abuse Father    Cancer Maternal Grandmother        unsure of type; died of other causes   Heart disease Maternal Aunt    Drug abuse Cousin     Social History:  Social History   Socioeconomic History   Marital status: Single    Spouse name: Not on file   Number of children: 3   Years of education: Not on file   Highest education level: Associate degree: occupational, scientist, product/process development, or vocational program  Occupational History   Not on file  Tobacco Use   Smoking status: Former    Current packs/day: 0.00    Types: Cigarettes, E-cigarettes    Start date: 05/05/1998    Quit date: 05/05/2018    Years since quitting: 5.8   Smokeless tobacco: Never  Vaping Use   Vaping status: Former  Substance and Sexual Activity   Alcohol use: Not Currently   Drug use: No   Sexual activity: Yes    Birth control/protection: Surgical    Comment: hysterectomy  Other Topics Concern   Not on file  Social History Narrative   Not on file   Social Drivers of Health   Tobacco Use: Medium Risk (02/22/2024)   Patient History    Smoking Tobacco Use: Former    Smokeless Tobacco Use: Never    Passive Exposure: Not on file  Financial  Resource Strain: Medium Risk (04/12/2023)   Overall Financial Resource Strain (CARDIA)    Difficulty of Paying Living Expenses: Somewhat hard  Food Insecurity: Food Insecurity Present (04/12/2023)   Hunger Vital Sign    Worried About Running Out of Food in the Last Year: Often true    Ran Out of Food in the Last Year: Sometimes true  Transportation Needs: No Transportation Needs (04/12/2023)   PRAPARE - Administrator, Civil Service (Medical): No    Lack of Transportation (Non-Medical): No  Physical Activity: Inactive (04/12/2023)   Exercise Vital Sign    Days of Exercise per Week: 0 days    Minutes of Exercise per Session: 0 min  Stress: Stress Concern Present (04/12/2023)   [employer] of Occupational Health - Occupational Stress Questionnaire    Feeling of Stress : Very much  Social Connections: Socially Isolated (04/12/2023)   Social Connection and Isolation Panel    Frequency of Communication with Friends and Family: Never    Frequency of Social Gatherings with Friends and Family: Never    Attends Religious Services: Never    Database Administrator or Organizations: No    Attends Banker Meetings: Never    Marital Status: Never married  Depression (PHQ2-9): High Risk (02/22/2024)   Depression (PHQ2-9)    PHQ-2 Score: 19  Alcohol Screen: Low Risk (04/12/2023)   Alcohol Screen    Last Alcohol Screening Score (AUDIT): 0  Housing: High Risk (04/12/2023)   Housing Stability Vital Sign    Unable to Pay for Housing in the Last Year: Yes    Number of Times Moved in the Last Year: 0    Homeless in the Last Year: No  Utilities: At Risk (04/12/2023)   AHC Utilities    Threatened with loss of utilities: Yes  Health Literacy: Adequate Health Literacy (04/12/2023)   B1300 Health Literacy    Frequency of need for help with medical instructions: Never    Allergies:  Allergies  Allergen Reactions   Ivp Dye [Iodinated Contrast Media] Hives, Itching and Swelling   Bee  Venom Swelling   Buspar  Chidera.chin ] Other (See Comments)    Metabolic Disorder Labs: Lab Results  Component Value Date   HGBA1C 6.4 (H) 11/30/2023   MPG 108.28 03/14/2018   No results found for: PROLACTIN Lab Results  Component Value Date   CHOL 163 11/30/2023   TRIG 155 (H) 11/30/2023   HDL 54 11/30/2023   CHOLHDL 3.0 11/30/2023   VLDL 34 10/13/2021   LDLCALC 82 11/30/2023   LDLCALC 114 (H) 07/06/2023   Lab Results  Component Value Date   TSH 1.030 02/17/2021   TSH 1.730 07/09/2020    Therapeutic Level Labs: No results found for: LITHIUM No results found for: VALPROATE No results found for: CBMZ  Current Medications: Current Outpatient Medications  Medication Sig Dispense Refill   Accu-Chek Softclix Lancets lancets Use as instructed 100 each 12   acetaminophen -codeine  (TYLENOL  #3) 300-30 MG tablet TAKE ONE TABLET BY MOUTH UP TO FOUR TIMES A DAY AS NEEDED 28 tablet 2   Ascorbic Acid (VITAMIN C) 1000 MG tablet Take 1,000 mg by mouth daily.     atorvastatin  (LIPITOR) 80 MG tablet Take 1 tablet (80 mg total) by mouth daily. 30 tablet 11   atropine  1 % ophthalmic solution INSTILL 1 DROP INTO AFFECTED EYE UP TO 3 TIMES DAILY 5 mL 3   b complex vitamins capsule Take 1 capsule by mouth daily.     Blood Glucose Monitoring Suppl (ACCU-CHEK GUIDE ME) w/Device KIT USE TO CHECK FASTING BLOOD SUGAR AND 2 HOURS AFTER LARGEST MEAL 1 kit 0   buPROPion  (WELLBUTRIN  XL) 150 MG 24 hr tablet Take 1 tablet (150 mg total) by mouth daily. 90 tablet 0   clonazePAM  (KLONOPIN ) 1 MG tablet Take 1 tablet (1 mg total) by mouth 2 (two) times daily. 60 tablet 5   dorzolamide -timolol  (COSOPT ) 22.3-6.8 MG/ML ophthalmic solution Place 1 drop into the right eye 2 (two) times daily.      ezetimibe  (ZETIA ) 10 MG tablet Take 1 tablet (10 mg total) by mouth daily. 90 tablet 3   fluticasone  (FLONASE ) 50 MCG/ACT nasal spray USE 1 -2 SPRAYS INTO BOTH NOSTRILS DAILY AS NEEDED FOR ALLERGIES 16 mL 5    glucose blood (ACCU-CHEK GUIDE) test strip Use to check fasting glucose and 2 hours after largest meal 100 each 12   Insulin Pen Needle (PEN NEEDLES) 32G X 4 MM MISC Use as directed for injecting insulin 100 each 2   Lacosamide  100 MG TABS TAKE 1 TABLET BY MOUTH TWICE A DAY 60 tablet 5   latanoprost  (XALATAN ) 0.005 % ophthalmic solution Place 1 drop into both eyes at bedtime.     liraglutide  (VICTOZA ) 18 MG/3ML SOPN Inject 1.8 mg into the skin daily. 9 mL 2   ondansetron  (ZOFRAN -ODT) 4 MG disintegrating tablet Take 1 tablet (4 mg total) by mouth every 8 (eight) hours as needed for nausea or vomiting. 20 tablet 11   pilocarpine  (SALAGEN ) 5 MG tablet Take 1 tablet (5 mg total) by mouth 2 (two) times daily. 60 tablet 11   RUXIENCE  500 MG/50ML injection 1000mg  IV once every 24 weeks 100 mL 1   sertraline  (ZOLOFT ) 100 MG tablet Take 1 tablet (100 mg total) by mouth daily. 90 tablet 0   SUMAtriptan  (IMITREX ) 100 MG tablet Take 1 tablet (100 mg total) by mouth once as needed for migraine. May repeat in 2 hours if headache persists or recurs. 9 tablet 5   traZODone  (DESYREL ) 50 MG tablet Take 1 tablet (50 mg total) by mouth at bedtime. 90 tablet 0   Ubrogepant  (UBRELVY ) 100 MG TABS One po every day prn migraine 10 tablet 11   verapamil  (CALAN -SR) 240 MG CR tablet TAKE 1 TABLET BY MOUTH EVERY DAY AT BEDTIME 30 tablet  5   Current Facility-Administered Medications  Medication Dose Route Frequency Provider Last Rate Last Admin   bupivacaine (PF) (MARCAINE ) 0.5 % injection 2 mL  2 mL Other Once Sater, Charlie LABOR, MD       lidocaine  (XYLOCAINE ) 2 % (with pres) injection 40 mg  2 mL Intradermal Once Sater, Charlie LABOR, MD         Musculoskeletal: Strength & Muscle Tone: within normal limits Gait & Station: normal Patient leans: N/A  Psychiatric Specialty Exam: Review of Systems  Psychiatric/Behavioral:  Positive for dysphoric mood. Negative for decreased concentration, hallucinations, self-injury, sleep  disturbance and suicidal ideas. The patient is nervous/anxious. The patient is not hyperactive.     Last menstrual period 05/02/2017.There is no height or weight on file to calculate BMI.  General Appearance: Casual  Eye Contact:  Good  Speech:  Clear and Coherent and Normal Rate  Volume:  Normal  Mood:  Anxious and Depressed  Affect:  Congruent  Thought Process:  Coherent, Goal Directed, and Descriptions of Associations: Intact  Orientation:  Full (Time, Place, and Person)  Thought Content: WDL   Suicidal Thoughts:  No  Homicidal Thoughts:  No  Memory:  Immediate;   Good Recent;   Good Remote;   Good  Judgement:  Good  Insight:  Good  Psychomotor Activity:  Normal  Concentration:  Concentration: Good and Attention Span: Good  Recall:  Good  Fund of Knowledge: Good  Language: Good  Akathisia:  No  Handed:  Right  AIMS (if indicated): not done  Assets:  Communication Skills Desire for Improvement Housing Social Support Transportation Vocational/Educational  ADL's:  Intact  Cognition: WNL  Sleep:  Good   Screenings: GAD-7    Flowsheet Row Video Visit from 02/22/2024 in [facility] Clinical Support from 01/09/2024 in [facility] Office Visit from 10/03/2023 in [facility] at [facility] Clinical Support from 08/14/2023 in [facility] Clinical Support from 07/12/2023 in [facility]  Total GAD-7 Score 21 18 14 15 12    PHQ2-9    Flowsheet Row Video Visit from 02/22/2024 in [facility] Clinical Support from 01/09/2024 in [facility] Office Visit from 10/03/2023 in [facility] at [facility] Clinical Support from 08/14/2023 in [facility] Clinical Support from 07/12/2023 in [facility]   PHQ-2 Total Score 4 4 3 3 6   PHQ-9 Total Score 19 18 20 15 26    Flowsheet Row Video Visit from 02/22/2024 in [facility] Clinical Support from 01/09/2024 in [facility] Clinical Support from 08/14/2023 in [facility]  C-SSRS RISK CATEGORY Low Risk No Risk No Risk     Assessment and Plan:   Angel Rodriguez. Angel Rodriguez is a 48 year old female with a past psychiatric history significant for major depressive disorder (severe episode, without psychotic features), PTSD, and generalized anxiety disorder who presents to [facility] via virtual video visit for follow-up and medication management.  Patient presents to the encounter stating that she has been taking her medications regularly and denies experiencing any adverse side effects.  She informed provider that she is still taking Wellbutrin  150 mg daily for the management of her depression and would like to continue taking the medication as prescribed.  Patient reports that her life has  been going well since the last encounter but she still continues to endorse some depression and anxiety.  She reports that her latest bout of depression and anxiety is attributed to her eye issues.  A PHQ-9 screening was performed with the patient scoring a 19.  A GAD-7 screen was also performed with the patient scoring a 21.  Patient denies the need for dosage adjustments at this time and would like to continue taking her medications as prescribed.  Patient's medications to be e-prescribed to pharmacy of choice.  A [place] Suicide Severity Rating Scale was performed with the patient being considered low risk.  Patient denies suicidal ideations and is able to contract for safety at this time.  Collaboration of Care: Collaboration of Care: Medication Management AEB provider managing patient's psychiatric medications, Primary Care Provider AEB  patient being seen by a primary care provider, Psychiatrist AEB patient being followed by mental health provider at this facility, and Other provider involved in patient's care AEB patient being seen by neurology  Patient/Guardian was advised Release of Information must be obtained prior to any record release in order to collaborate their care with an outside provider. Patient/Guardian was advised if they have not already done so to contact the registration department to sign all necessary forms in order for us  to release information regarding their care.   Consent: Patient/Guardian gives verbal consent for treatment and assignment of benefits for services provided during this visit. Patient/Guardian expressed understanding and agreed to proceed.   1. Severe episode of recurrent major depressive disorder, without psychotic features (HCC)  - traZODone  (DESYREL ) 50 MG tablet; Take 1 tablet (50 mg total) by mouth at bedtime.  Dispense: 90 tablet; Refill: 0 - sertraline  (ZOLOFT ) 100 MG tablet; Take 1 tablet (100 mg total) by mouth daily.  Dispense: 90 tablet; Refill: 0 - buPROPion  (WELLBUTRIN  XL) 150 MG 24 hr tablet; Take 1 tablet (150 mg total) by mouth daily.  Dispense: 90 tablet; Refill: 0  2. GAD (generalized anxiety disorder)  - sertraline  (ZOLOFT ) 100 MG tablet; Take 1 tablet (100 mg total) by mouth daily.  Dispense: 90 tablet; Refill: 0  3. PTSD (post-traumatic stress disorder)  - sertraline  (ZOLOFT ) 100 MG tablet; Take 1 tablet (100 mg total) by mouth daily.  Dispense: 90 tablet; Refill: 0  Patient to follow up in 2 months Provider spent a total of 13 minutes with the patient/reviewing patient's chart  Reginia FORBES Bolster, PA 02/22/2024, 8:02 PM

## 2024-03-07 ENCOUNTER — Inpatient Hospital Stay (HOSPITAL_BASED_OUTPATIENT_CLINIC_OR_DEPARTMENT_OTHER): Admission: RE | Admit: 2024-03-07 | Source: Ambulatory Visit | Admitting: Radiology

## 2024-03-11 ENCOUNTER — Telehealth: Payer: Self-pay | Admitting: Neurology

## 2024-03-11 NOTE — Telephone Encounter (Signed)
 Pt called stating that she has had the same headaches d for a while  and new medication  is not working . Pt is requesting to speak to Nurse about getting sooner appt

## 2024-03-12 ENCOUNTER — Encounter: Payer: Self-pay | Admitting: Neurology

## 2024-03-13 ENCOUNTER — Other Ambulatory Visit: Payer: Self-pay | Admitting: Neurology

## 2024-03-13 MED ORDER — ONDANSETRON 4 MG PO TBDP: 4.0000 mg | ORAL_TABLET | Freq: Three times a day (TID) | ORAL | 11 refills | Status: AC | PRN

## 2024-03-13 NOTE — Telephone Encounter (Signed)
 I called pt folllowing up on her message.  She has symptoms of 2-3 wks of R head pain, throbbing pain will wake her up 2-3 am.  She feels that it is cluster and nothing is helping.  She stated that she felt that last trigger injection being different (only methylprednisolone ) may be reason Last seen 11-06-2023.   She is not sure.  She is using o2 at night. Ubrelvy  helps her migraine.  She does not take sumatriptan .  Verapamil  120mg  daily.  She is on sertraline /wellbutrin  by psych. She had not taken tylenol  with codiene, I told her that she can try this and see if helps (she was concerned that taking with trazodone  was problem).  Needs refill on zofran .  She asks about coming in sooner.  Will address when I call her back.  (No availability).  Do you have any other recommendations for her.

## 2024-03-13 NOTE — Telephone Encounter (Signed)
 See phone note. Pt was called.

## 2024-03-13 NOTE — Telephone Encounter (Signed)
 I called pt and added her to the schedule Monday 1500 for med management/ possible inj.  Zofran  was refilled.  She will take ubrelvy  and see if that will help (I explained that she can take this again 2 hrs later (max 2 daily) if migraine is not totally gone. She does not like taking the trazadone.  I told her she can also try the tylenol  with codiene for her headaches as well. (Take ubrelvy  or tylenol  w/ cod).  She appreciated phone call.

## 2024-03-17 ENCOUNTER — Encounter: Payer: Self-pay | Admitting: Neurology

## 2024-03-17 ENCOUNTER — Ambulatory Visit: Payer: Self-pay | Admitting: Neurology

## 2024-03-17 VITALS — BP 110/73 | HR 74 | Wt 169.0 lb

## 2024-03-17 DIAGNOSIS — G441 Vascular headache, not elsewhere classified: Secondary | ICD-10-CM

## 2024-03-17 DIAGNOSIS — Z79899 Other long term (current) drug therapy: Secondary | ICD-10-CM

## 2024-03-17 DIAGNOSIS — G4734 Idiopathic sleep related nonobstructive alveolar hypoventilation: Secondary | ICD-10-CM

## 2024-03-17 DIAGNOSIS — R569 Unspecified convulsions: Secondary | ICD-10-CM | POA: Diagnosis not present

## 2024-03-17 DIAGNOSIS — H348112 Central retinal vein occlusion, right eye, stable: Secondary | ICD-10-CM

## 2024-03-17 DIAGNOSIS — G44221 Chronic tension-type headache, intractable: Secondary | ICD-10-CM

## 2024-03-17 DIAGNOSIS — I776 Arteritis, unspecified: Secondary | ICD-10-CM | POA: Diagnosis not present

## 2024-03-17 MED ORDER — BETAMETHASONE SOD PHOS & ACET 6 (3-3) MG/ML IJ SUSP
12.0000 mg | Freq: Once | INTRAMUSCULAR | Status: AC
  Administered 2024-03-17: 12 mg via INTRAMUSCULAR

## 2024-03-17 NOTE — Progress Notes (Signed)
 "  [facility]  PATIENT: Angel Rodriguez DOB: 05-04-1975  REFERRING DOCTOR OR PCP:   SOURCE: patient, ED records, images on PACS, reports in EMR  _________________________________   HISTORICAL  CHIEF COMPLAINT:  Chief Complaint  Patient presents with   RM11/CNS     Pt is here with her Husband. Pt states that she has been having dizziness lately. Pt states that her head is hurting.     HISTORY OF PRESENT ILLNESS:  Angel Rodriguez is a 49 y.o. woman with frequent headaches and h/o seizure.  Update 11/06/2023: She is on Ruxience  (rituximab ) for CNS vasculitis. Last infusion was June 2024.   She felt she had some reaction to that one and needed the rate slowed and took more Benadryl .  She slept a lot the next day   She has done well and has less eye pain/pressure.       I have reviewed the MRI of the brain from 06/22/2022. It shows 2 T2/FLAIR hyperintense foci in the left frontal lobe, both in the subcortical region. These are consistent with her remote strokes. There were no new findings. No enhancing lesions. She also appears to have a small left frontal arachnoid cyst that would be asymptomatic   Over the last month she has had more headaches.  These are severe and are worse during the night.  She feels pressure in the eyes, right more than left, and pain on the right side of her face.  Sometimes she has visua chanes.   Right eye is often red.  She is having frequent migraines (12-16 a month for > 4 hrs a day) .  These are right-sided headaches associated with scleral changes in the eye have worsened, sphenopalatine ganglion blocks have been very helpful with complete elimination of the pain for many months.  Trigger point injections have helped which she has neck pain as well.  Sumatriptan  helps sometimes  She has not tried an anti-CGRP agent and I will give her some samples.  She felt much better on Nurtec.    She denies any visual changes.   She has numbness in hr hands  but no weakness.  The numbness is worse on the right.   It is in the palms more than dorsum.  Shaking her arms help.       She has no recent seizure.   First one was 2016 and she had several when med's were withdrawn so was restarted.  Her last one was April 2021.  She is on Vimpat .    Her depression is doing worse.  She was on Trintellix  and clonazepam  for anxiety and depression and felt better than she has done on the more recent medications (Aomoxetine).  . Insurance has not approved Trintellix  for her..   I have recommended nefazodone.  Her psychiatrist also was considering trazodone .  She has not tried either of these.  She has no OSA on PSG 10/20/2020   The study found nocturnal hypoxemia and frequent bigeminy/trigeminy.    Nocturia O2 was started and she feels better.   She saw pulmonary.    PFTs were normal.      She saw cardiology.   Echo showed mild diastoic dysfunction.    Placed on a statin.       PSG 10/20/2021: No significant OSA was noted 44% of the night was with SaO2 80-89%     ---- nocturnal O2 recommended. 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  She had a second opinion from [place].  Changes in her seizure medications were made but no opinion about the possibility of vasculitis.  She was placed on prednisone  60 mg and titrating down in July 2021.  She had improvement of headaches and other symptoms.  MRI 10/15/2019 showed:   Resolution of a region of T2 and FLAIR signal within the posterior frontal cortical and subcortical brain on the study of May. One could question slight increased prominence of a T2 and FLAIR focus without enhancement in the subcortical brain at the bottom of a left frontal sulcus, axial FLAIR image 19. No other new or  potentially progressive finding. Minimal small foci of cortical and subcortical enhancement in the left frontal region. I think the differential diagnosis for this case remains that of a vasculitis syndrome, venous thrombotic syndrome, autoimmune syndrome or demyelinating syndrome. I do not think we are dealing with tumor or arterial infarctions.   MRI brain 07/06/2018 personally reviewed and compared to studies from January 2020.  It shows 2 foci in the anterior left frontal lobe.  One was mildly increased in size compared to the January MRI and one was decreased in size.  Another focus seen on the previous MRI in the left parasagittal frontal lobe has resolved.  MRI of the cervical and thoracic spine 03/25/2018 showed normal spinal cord and mild cervical degenerative changes.  MRI of the brain from 06/22/2022. It shows 2 T2/FLAIR hyperintense foci in the left frontal lobe, both in the subcortical region. These are consistent with her remote strokes. There were no new findings. No enhancing lesions. She also appears to have a small left frontal arachnoid cyst that would be asymptomatic.   Labs: CSF 03/25/2018 showed 10 white blood cells (lymphocytes), 3 red cells, normal glucose, protein oligoclonal bands, VDRL.  In 03/2018, negative ANA, cANCA and pANCA, weakly positive ANCA proteinase 3, normal ACE level. ESR 10.  HIV was negative 09/22/2017.  Hepatitis C was negative 04/2016     REVIEW OF SYSTEMS: Constitutional: No fevers, chills, sweats, or change in appetite.  She has insomnia. Eyes: as above Ear, nose and throat: No hearing loss, ear pain, nasal congestion, sore throat Cardiovascular: No chest pain, palpitations Respiratory:  No shortness of breath at rest or with exertion.   No wheezes GastrointestinaI: No nausea, vomiting, diarrhea, abdominal pain, fecal incontinence Genitourinary:  No dysuria, urinary retention or frequency.  No nocturia.   Possible ovarian cancer Musculoskeletal:  No neck  pain, back pain Integumentary: No rash, pruritus, skin lesions Neurological: as above Psychiatric: No depression at this time.  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 Allergic/Immunologic: No itchy/runny eyes, nasal congestion, recent allergic reactions, rashes  ALLERGIES: Allergies  Allergen Reactions   Ivp Dye [Iodinated Contrast Media] Hives, Itching and Swelling   Bee Venom Swelling   Buspar  [Buspirone ] Other (See Comments)    HOME MEDICATIONS:  Current Outpatient Medications:    acetaminophen -codeine  (TYLENOL  #3) 300-30 MG tablet, TAKE ONE TABLET BY MOUTH UP TO FOUR TIMES A DAY AS NEEDED, Disp: 28 tablet, Rfl: 2   atorvastatin  (LIPITOR) 80 MG tablet, Take 1 tablet (80 mg total) by  mouth daily., Disp: 30 tablet, Rfl: 11   atropine  1 % ophthalmic solution, INSTILL 1 DROP INTO AFFECTED EYE UP TO 3 TIMES DAILY, Disp: 5 mL, Rfl: 3   buPROPion  (WELLBUTRIN  XL) 150 MG 24 hr tablet, Take 1 tablet (150 mg total) by mouth daily., Disp: 90 tablet, Rfl: 0   clonazePAM  (KLONOPIN ) 1 MG tablet, Take 1 tablet (1 mg total) by mouth 2 (two) times daily., Disp: 60 tablet, Rfl: 5   dorzolamide -timolol  (COSOPT ) 22.3-6.8 MG/ML ophthalmic solution, Place 1 drop into the right eye 2 (two) times daily. , Disp: , Rfl:    ezetimibe  (ZETIA ) 10 MG tablet, Take 1 tablet (10 mg total) by mouth daily., Disp: 90 tablet, Rfl: 3   fluticasone  (FLONASE ) 50 MCG/ACT nasal spray, USE 1 -2 SPRAYS INTO BOTH NOSTRILS DAILY AS NEEDED FOR ALLERGIES, Disp: 16 mL, Rfl: 5   Lacosamide  100 MG TABS, TAKE 1 TABLET BY MOUTH TWICE A DAY, Disp: 60 tablet, Rfl: 5   latanoprost  (XALATAN ) 0.005 % ophthalmic solution, Place 1 drop into both eyes at bedtime., Disp: , Rfl:    liraglutide  (VICTOZA ) 18 MG/3ML SOPN, Inject 1.8 mg into  the skin daily., Disp: 9 mL, Rfl: 2   ondansetron  (ZOFRAN -ODT) 4 MG disintegrating tablet, Take 1 tablet (4 mg total) by mouth every 8 (eight) hours as needed for nausea or vomiting., Disp: 20 tablet, Rfl: 11   pilocarpine  (SALAGEN ) 5 MG tablet, Take 1 tablet (5 mg total) by mouth 2 (two) times daily., Disp: 60 tablet, Rfl: 11   RUXIENCE  500 MG/50ML injection, 1000mg  IV once every 24 weeks, Disp: 100 mL, Rfl: 1   sertraline  (ZOLOFT ) 100 MG tablet, Take 1 tablet (100 mg total) by mouth daily., Disp: 90 tablet, Rfl: 0   traZODone  (DESYREL ) 50 MG tablet, Take 1 tablet (50 mg total) by mouth at bedtime., Disp: 90 tablet, Rfl: 0   Ubrogepant  (UBRELVY ) 100 MG TABS, One po every day prn migraine, Disp: 10 tablet, Rfl: 11   verapamil  (CALAN -SR) 240 MG CR tablet, TAKE 1 TABLET BY MOUTH EVERY DAY AT BEDTIME, Disp: 30 tablet, Rfl: 5   Accu-Chek Softclix Lancets lancets, Use as instructed, Disp: 100 each, Rfl: 12   Ascorbic Acid (VITAMIN C) 1000 MG tablet, Take 1,000 mg by mouth daily. (Patient not taking: Reported on 03/17/2024), Disp: , Rfl:    b complex vitamins capsule, Take 1 capsule by mouth daily. (Patient not taking: Reported on 03/17/2024), Disp: , Rfl:    Blood Glucose Monitoring Suppl (ACCU-CHEK GUIDE ME) w/Device KIT, USE TO CHECK FASTING BLOOD SUGAR AND 2 HOURS AFTER LARGEST MEAL, Disp: 1 kit, Rfl: 0   glucose blood (ACCU-CHEK GUIDE) test strip, Use to check fasting glucose and 2 hours after largest meal, Disp: 100 each, Rfl: 12   Insulin Pen Needle (PEN NEEDLES) 32G X 4 MM MISC, Use as directed for injecting insulin, Disp: 100 each, Rfl: 2   SUMAtriptan  (IMITREX ) 100 MG tablet, Take 1 tablet (100 mg total) by mouth once as needed for migraine. May repeat in 2 hours if headache persists or recurs. (Patient not taking: Reported on 03/17/2024), Disp: 9 tablet, Rfl: 5  Current Facility-Administered Medications:    bupivacaine (PF) (MARCAINE ) 0.5 % injection 2 mL, 2 mL, Other, Once, Kaniah Rizzolo A, MD    lidocaine  (XYLOCAINE ) 2 % (with pres) injection 40 mg, 2 mL, Intradermal, Once, Oletta Buehring, Charlie LABOR, MD  PAST MEDICAL HISTORY: Past Medical History:  Diagnosis Date   Anxiety    Blind right eye  2009   swelling & pressure   Cluster headaches    started after seizure and migraines   Complication of anesthesia    trouble with short term memory after surgeries   COVID-19    Depression    Diastolic dysfunction    a. 01/2021 Echo: EF 60-65%, no rwma, GrII DD, nl RV fxn, mildly dil LA. Triv AI.   Fibroids    s/p TLH, bilateral salpingectomy   GERD (gastroesophageal reflux disease)    patient thinks due to medications   History of anemia    prior to hysterectomy   History of pneumonia    History of trichomoniasis    Seizures (HCC) x 1 2-3 yrs ago none since   once saw neurologist, full body brain MRI and another 6 months lster   Status post right oophorectomy    Thrombocytopenia    sees [facility] with Heme Onc   Vasculitis    neuro    PAST SURGICAL HISTORY: Past Surgical History:  Procedure Laterality Date   CYSTOSCOPY N/A 07/31/2017   Procedure: CYSTOSCOPY;  Surgeon: Cleotilde Ronal RAMAN, MD;  Location: [facility];  Service: Gynecology;  Laterality: N/A;   ENDOMETRIAL ABLATION     EYE SURGERY     laser - removed blood from right eye   HYSTEROSCOPY WITH D & C     LAPAROSCOPY N/A 09/19/2017   Procedure: LAPAROSCOPY DIAGNOSTIC WITH RIGHT OOPHERECTOMY, LYSIS OF ADHESIONS;  Surgeon: Jertson, Jill Evelyn, MD;  Location: [facility];  Service: Gynecology;  Laterality: N/A;   OMENTECTOMY N/A 11/21/2017   Procedure: OMENTECTOMY AND PELVIC WASHINGS;  Surgeon: Anitra Freddy NOVAK, MD;  Location: [facility];  Service: Gynecology;  Laterality: N/A;   ROBOTIC ASSISTED SALPINGO OOPHERECTOMY Left 11/21/2017   Procedure: XI ROBOTIC ASSISTED LEFT SALPINGO OOPHORECTOMY;  Surgeon: Anitra Freddy NOVAK, MD;  Location: [facility];  Service: Gynecology;  Laterality: Left;   TOTAL LAPAROSCOPIC HYSTERECTOMY WITH SALPINGECTOMY Bilateral  07/31/2017   Procedure: TOTAL LAPAROSCOPIC HYSTERECTOMY WITH SALPINGECTOMY;  Surgeon: Cleotilde Ronal RAMAN, MD;  Location: [facility];  Service: Gynecology;  Laterality: Bilateral;  20 week size uterus/ Alexis bag in room/ need 4.5 hours   TUBAL LIGATION     WISDOM TOOTH EXTRACTION      FAMILY HISTORY: Family History  Problem Relation Age of Onset   Sleep apnea Mother    Alcoholism Father    Cirrhosis Father    Alcohol abuse Father    Cancer Maternal Grandmother        unsure of type; died of other causes   Heart disease Maternal Aunt    Drug abuse Cousin     SOCIAL HISTORY:  Social History   Socioeconomic History   Marital status: Single    Spouse name: Not on file   Number of children: 3   Years of education: Not on file   Highest education level: Associate degree: occupational, scientist, product/process development, or vocational program  Occupational History   Not on file  Tobacco Use   Smoking status: Former    Current packs/day: 0.00    Types: Cigarettes, E-cigarettes    Start date: 05/05/1998    Quit date: 05/05/2018    Years since quitting: 5.8   Smokeless tobacco: Never  Vaping Use   Vaping status: Former  Substance and Sexual Activity   Alcohol use: Not Currently   Drug use: No   Sexual activity: Yes    Birth control/protection: Surgical    Comment: hysterectomy  Other Topics Concern   Not on  file  Social History Narrative   Not on file   Social Drivers of Health   Tobacco Use: Medium Risk (03/17/2024)   Patient History    Smoking Tobacco Use: Former    Smokeless Tobacco Use: Never    Passive Exposure: Not on file  Financial Resource Strain: Medium Risk (04/12/2023)   Overall Financial Resource Strain (CARDIA)    Difficulty of Paying Living Expenses: Somewhat hard  Food Insecurity: Food Insecurity Present (04/12/2023)   Hunger Vital Sign    Worried About Running Out of Food in the Last Year: Often true    Ran Out of Food in the Last Year: Sometimes true  Transportation Needs: No  Transportation Needs (04/12/2023)   PRAPARE - Administrator, Civil Service (Medical): No    Lack of Transportation (Non-Medical): No  Physical Activity: Inactive (04/12/2023)   Exercise Vital Sign    Days of Exercise per Week: 0 days    Minutes of Exercise per Session: 0 min  Stress: Stress Concern Present (04/12/2023)   [employer] of Occupational Health - Occupational Stress Questionnaire    Feeling of Stress : Very much  Social Connections: Socially Isolated (04/12/2023)   Social Connection and Isolation Panel    Frequency of Communication with Friends and Family: Never    Frequency of Social Gatherings with Friends and Family: Never    Attends Religious Services: Never    Database Administrator or Organizations: No    Attends Banker Meetings: Never    Marital Status: Never married  Intimate Partner Violence: Not At Risk (04/12/2023)   Humiliation, Afraid, Rape, and Kick questionnaire    Fear of Current or Ex-Partner: No    Emotionally Abused: No    Physically Abused: No    Sexually Abused: No  Depression (PHQ2-9): High Risk (02/22/2024)   Depression (PHQ2-9)    PHQ-2 Score: 19  Alcohol Screen: Low Risk (04/12/2023)   Alcohol Screen    Last Alcohol Screening Score (AUDIT): 0  Housing: Medium Risk (02/23/2024)   Received from [facility]   Epic    What is your living situation today?: I have a place to live today, but I am worried about losing it in the future    Think about the place you live. Do you have problems with any of the following? Choose all that apply:: Pt declined to answer  Utilities: At Risk (04/12/2023)   [facility] Utilities    Threatened with loss of utilities: Yes  Health Literacy: Adequate Health Literacy (04/12/2023)   B1300 Health Literacy    Frequency of need for help with medical instructions: Never     PHYSICAL EXAM  Vitals:   03/17/24 1516  BP: 110/73  Pulse: 74  Weight: 169 lb (76.7 kg)    Body mass index is 33.01  kg/m.   General: The patient is well-developed and well-nourished and in no acute distress.  Eyes:   She has mild conjunctival erythema on the right..    Neck: She has tenderness over the right occiput and the right splenius capitis muscle.  The  range of motion is fairly normal in the neck.  Neurologic Exam  Mental status: The patient is alert and oriented x 3 at the time of the examination. The patient has apparent normal recent and remote memory, with an apparently normal attention span and concentration ability.   Speech is normal.  Cranial nerves: Extraocular movements are full.  Very mild right ptosis and facial strength  is normal elsewhere.  There is normal facial sensation to soft touch.   Trapezius and sternocleidomastoid strength is normal. No dysarthria is noted.  Hearing is symmetric.  Motor:  Muscle bulk is normal.   Tone is normal. Strength is  5 / 5 in all 4 extremities.   Sensory: She has normal sensation to touch and vibration in the arms and legs   Gait and station: Station is normal.  Her gait is normal though the tandem gait is slightly wide. DTRs:  Normal and symmetric    DIAGNOSTIC DATA (LABS, IMAGING, TESTING) - I reviewed patient records, labs, notes, testing and imaging myself where available.  Lab Results  Component Value Date   WBC 5.7 11/07/2023   HGB 13.5 11/07/2023   HCT 40.9 11/07/2023   MCV 88 11/07/2023   PLT 121 (L) 11/07/2023      Component Value Date/Time   NA 142 11/30/2023 1024   NA 138 08/31/2016 0957   K 4.2 11/30/2023 1024   K 3.9 08/31/2016 0957   CL 105 11/30/2023 1024   CO2 19 (L) 11/30/2023 1024   CO2 22 08/31/2016 0957   GLUCOSE 88 11/30/2023 1024   GLUCOSE 148 (H) 07/10/2018 0915   GLUCOSE 106 08/31/2016 0957   BUN 6 11/30/2023 1024   BUN 8.6 08/31/2016 0957   CREATININE 0.79 11/30/2023 1024   CREATININE 0.8 08/31/2016 0957   CALCIUM  10.2 11/30/2023 1024   CALCIUM  10.3 08/31/2016 0957   PROT 7.7 11/30/2023 1024    PROT 7.6 08/31/2016 0957   ALBUMIN 4.8 11/30/2023 1024   ALBUMIN 3.9 08/31/2016 0957   AST 25 11/30/2023 1024   AST 18 08/31/2016 0957   ALT 57 (H) 11/30/2023 1024   ALT 23 08/31/2016 0957   ALKPHOS 110 11/30/2023 1024   ALKPHOS 62 08/31/2016 0957   BILITOT 0.6 11/30/2023 1024   BILITOT 0.43 08/31/2016 0957   GFRNONAA 111 02/04/2019 0902   GFRAA 128 02/04/2019 0902         PROCEDURE Sphenopalatine ganglion block The patient was placed in the supine position.  A 20g catheter was lubricated with gel, and placed in the right naris. The catheter was inserted above the middle turbinate towards the posterior nasal cavity. .  2 mL of 2% lidocaine  was placed. The patient was asked to swallow during the injection. The patient demonstrated erythema of the sclera of the eye on this side, and tearing.   The patient tolerated the procedure well. No complications of the procedure were noted. The patient was kept in the supine position for 2-3 minutes following the procedure.  By 3 minutes the headache had resolved.     ASSESSMENT AND PLAN  No diagnosis found.   1.   Continue Rituximab  for CNS vaculitis   check labs 2.   Seizures are well-controlled and she will continue Vimpat .    3.   Prefer not to have her increasE Wellbutrin  as it might increase risk of seizure.   Consider change to nefazodone since Trintellix  expensive (SSRI not helpful and nefazodone has most similar MOA).     4.    She has nocturnal hypoxemia.   Continue 2L O2 at night.  5.   Ubrelvy  100 mg prn (2 samples [id number] 02/2025) and send in script.  Also gave samples of Nurtec [id number] 01/2027.  Addendum: If there is no benefit she can take a Tylenol  3 and we can refill if needed 6.   Right sphenopalatine ganglion block with 2 cc  of 2% lidocaine .  She reported pain was much better afterwards. 7.   Right splenius capitus trigger point injection with 80 mg Depo-Medrol  (Lot [id number]  )  in 3 cc Lidocaine  using sterile technique.   She tolerated the procedure well and pain was better afterwards.  return in 6 months.  She will call if she has new or worsening symptoms.    Blannie Shedlock A. Vear, MD, PhD 03/17/2024, 3:29 PM Certified in Neurology, Clinical Neurophysiology, Sleep Medicine, Pain Medicine and Neuroimaging   [facility] [address] [place], [place] [address] [phone] "

## 2024-03-17 NOTE — Progress Notes (Signed)
 "  [facility]  PATIENT: Angel Rodriguez DOB: [birth year]  REFERRING DOCTOR OR PCP:   SOURCE: patient, ED records, images on PACS, reports in EMR  _________________________________   HISTORICAL  CHIEF COMPLAINT:  Chief Complaint  Patient presents with   RM11/CNS     Pt is here with her Husband. Pt states that she has been having dizziness lately. Pt states that her head is hurting.     HISTORY OF PRESENT ILLNESS:  Angel Rodriguez is a 49 y.o. woman with frequent headaches and h/o seizure.  Update 03/17/2024: She is on Ruxience  (rituximab ) for CNS vasculitis. Last infusion was December 2025.  She has had HA since the last infusion.  Usually suggest has some mild infusion related side effects the day of the infusion and the next day.  She continues to experience HA but has not had other new neuro or ophtho symptoms since being in Rituxan     MRIs have been stable while on Rituxan.  MRI of the brain from 06/22/2022. It shows 2 T2/FLAIR hyperintense foci in the left frontal lobe, both in the subcortical region. These are consistent with her remote strokes. There were no new findings. No enhancing lesions. She also appears to have a small left frontal arachnoid cyst that would be asymptomatic   She is experiencing a bad HA over the past month that started after her last Rituxan infusion.   Pain is on the right and in her eye.    She has seen ophtholmaology lately.  HA's are severe and are worse during the night.  She feels pressure in the eyes, right more than left, and pain on the right side of her face.  Sometimes she has visua chanes.   Right eye is often red.  Besides the more constant headache that has been occurring over the last couple weeks, she is having frequent migraines (12-16 a month for > 4 hrs a day) .  These are right-sided headaches associated with scleral changes in the eye have worsened, sphenopalatine ganglion blocks have been very helpful with complete  elimination of the pain for many months.  Trigger point injections have helped which she has neck pain as well.  Sumatriptan  helps sometimes Ubrelvy  has helped the most.  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     She has numbness in hr hands but no weakness.  The numbness is worse on the right.   It is in the palms more than dorsum.  Shaking her arms help.       She has no recent seizure.   First one was 2016 and she had several when med's were withdrawn so was restarted.  Her last one was April 2021.  She is on Vimpat .  She tolerates it well  Sertraline  has not adequately helped her headache.  Insurance was unable to cover Trintellix .  Her psychiatrist is recommending trazodone .   She has no OSA on PSG 10/20/2020   The study found nocturnal hypoxemia and frequent bigeminy/trigeminy.    Nocturia O2 was started and she feels better.   She saw pulmonary.    PFTs were normal.      She saw cardiology.   Echo showed mild diastoic dysfunction.    Placed on a statin.       PSG 10/20/2021: No significant OSA was noted 44% of the night was with SaO2 80-89%     ---- nocturnal O2 recommended. 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  She had a second opinion from [place].  Changes in her seizure medications were made but no opinion about the possibility of vasculitis.  She was placed on prednisone  60 mg and titrating down in July 2021.  She had improvement of headaches and other symptoms.  MRI 10/15/2019 showed:   Resolution of a region of T2 and FLAIR signal within the posterior frontal cortical and subcortical brain on the study of May. One could question slight increased prominence of a T2 and FLAIR focus without enhancement in the subcortical brain at the bottom of  a left frontal sulcus, axial FLAIR image 19. No other new or potentially progressive finding. Minimal small foci of cortical and subcortical enhancement in the left frontal region. I think the differential diagnosis for this case remains that of a vasculitis syndrome, venous thrombotic syndrome, autoimmune syndrome or demyelinating syndrome. I do not think we are dealing with tumor or arterial infarctions.   MRI brain 07/06/2018 personally reviewed and compared to studies from January 2020.  It shows 2 foci in the anterior left frontal lobe.  One was mildly increased in size compared to the January MRI and one was decreased in size.  Another focus seen on the previous MRI in the left parasagittal frontal lobe has resolved.  MRI of the cervical and thoracic spine 03/25/2018 showed normal spinal cord and mild cervical degenerative changes.  MRI of the brain from 06/22/2022. It shows 2 T2/FLAIR hyperintense foci in the left frontal lobe, both in the subcortical region. These are consistent with her remote strokes. There were no new findings. No enhancing lesions. She also appears to have a small left frontal arachnoid cyst that would be asymptomatic.   MRI brain 910/2025 showed Two T2/FLAIR hyperintense foci in the subcortical/juxtacortical white matter of the left frontal lobe.  Both of these were present on the MRI from 2021 and appeared unchanged.  The appearance is improved compared to the MRI from 03/13/2018.  They do not enhance.  They are consistent with the patient's history of CNS vasculitis.  Labs: CSF 03/25/2018 showed 10 white blood cells (lymphocytes), 3 red cells, normal glucose, protein oligoclonal bands, VDRL.  In 03/2018, negative ANA, cANCA and pANCA, weakly positive ANCA proteinase 3, normal ACE level. ESR 10.  HIV was negative 09/22/2017.  Hepatitis C was negative 04/2016     REVIEW OF SYSTEMS: Constitutional: No fevers, chills, sweats, or change in appetite.  She has insomnia. Eyes: as  above Ear, nose and throat: No hearing loss, ear pain, nasal congestion, sore throat Cardiovascular: No chest pain, palpitations Respiratory:  No shortness of breath at rest or with exertion.   No wheezes GastrointestinaI: No nausea, vomiting, diarrhea, abdominal pain, fecal incontinence Genitourinary:  No dysuria, urinary retention or frequency.  No nocturia.   Possible ovarian cancer Musculoskeletal:  No neck pain, back pain Integumentary: No rash, pruritus, skin lesions Neurological: as above Psychiatric: No depression at this time.  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 Allergic/Immunologic: No itchy/runny eyes, nasal congestion, recent allergic reactions, rashes  ALLERGIES: Allergies  Allergen Reactions   Ivp Dye [Iodinated Contrast Media] Hives, Itching and Swelling  Bee Venom Swelling   Buspar  [Buspirone ] Other (See Comments)    HOME MEDICATIONS:  Current Outpatient Medications:    acetaminophen -codeine  (TYLENOL  #3) 300-30 MG tablet, TAKE ONE TABLET BY MOUTH UP TO FOUR TIMES A DAY AS NEEDED, Disp: 28 tablet, Rfl: 2   atorvastatin  (LIPITOR) 80 MG tablet, Take 1 tablet (80 mg total) by mouth daily., Disp: 30 tablet, Rfl: 11   atropine  1 % ophthalmic solution, INSTILL 1 DROP INTO AFFECTED EYE UP TO 3 TIMES DAILY, Disp: 5 mL, Rfl: 3   buPROPion  (WELLBUTRIN  XL) 150 MG 24 hr tablet, Take 1 tablet (150 mg total) by mouth daily., Disp: 90 tablet, Rfl: 0   clonazePAM  (KLONOPIN ) 1 MG tablet, Take 1 tablet (1 mg total) by mouth 2 (two) times daily., Disp: 60 tablet, Rfl: 5   dorzolamide -timolol  (COSOPT ) 22.3-6.8 MG/ML ophthalmic solution, Place 1 drop into the right eye 2 (two) times daily. , Disp: , Rfl:    ezetimibe  (ZETIA ) 10 MG tablet, Take 1 tablet (10 mg total) by mouth  daily., Disp: 90 tablet, Rfl: 3   fluticasone  (FLONASE ) 50 MCG/ACT nasal spray, USE 1 -2 SPRAYS INTO BOTH NOSTRILS DAILY AS NEEDED FOR ALLERGIES, Disp: 16 mL, Rfl: 5   Lacosamide  100 MG TABS, TAKE 1 TABLET BY MOUTH TWICE A DAY, Disp: 60 tablet, Rfl: 5   latanoprost  (XALATAN ) 0.005 % ophthalmic solution, Place 1 drop into both eyes at bedtime., Disp: , Rfl:    liraglutide  (VICTOZA ) 18 MG/3ML SOPN, Inject 1.8 mg into the skin daily., Disp: 9 mL, Rfl: 2   ondansetron  (ZOFRAN -ODT) 4 MG disintegrating tablet, Take 1 tablet (4 mg total) by mouth every 8 (eight) hours as needed for nausea or vomiting., Disp: 20 tablet, Rfl: 11   pilocarpine  (SALAGEN ) 5 MG tablet, Take 1 tablet (5 mg total) by mouth 2 (two) times daily., Disp: 60 tablet, Rfl: 11   RUXIENCE  500 MG/50ML injection, 1000mg  IV once every 24 weeks, Disp: 100 mL, Rfl: 1   sertraline  (ZOLOFT ) 100 MG tablet, Take 1 tablet (100 mg total) by mouth daily., Disp: 90 tablet, Rfl: 0   traZODone  (DESYREL ) 50 MG tablet, Take 1 tablet (50 mg total) by mouth at bedtime., Disp: 90 tablet, Rfl: 0   Ubrogepant  (UBRELVY ) 100 MG TABS, One po every day prn migraine, Disp: 10 tablet, Rfl: 11   verapamil  (CALAN -SR) 240 MG CR tablet, TAKE 1 TABLET BY MOUTH EVERY DAY AT BEDTIME, Disp: 30 tablet, Rfl: 5   Accu-Chek Softclix Lancets lancets, Use as instructed, Disp: 100 each, Rfl: 12   Ascorbic Acid (VITAMIN C) 1000 MG tablet, Take 1,000 mg by mouth daily. (Patient not taking: Reported on 03/17/2024), Disp: , Rfl:    b complex vitamins capsule, Take 1 capsule by mouth daily. (Patient not taking: Reported on 03/17/2024), Disp: , Rfl:    Blood Glucose Monitoring Suppl (ACCU-CHEK GUIDE ME) w/Device KIT, USE TO CHECK FASTING BLOOD SUGAR AND 2 HOURS AFTER LARGEST MEAL, Disp: 1 kit, Rfl: 0   glucose blood (ACCU-CHEK GUIDE) test strip, Use to check fasting glucose and 2 hours after largest meal, Disp: 100 each, Rfl: 12   Insulin Pen Needle (PEN NEEDLES) 32G X 4 MM MISC, Use as  directed for injecting insulin, Disp: 100 each, Rfl: 2   SUMAtriptan  (IMITREX ) 100 MG tablet, Take 1 tablet (100 mg total) by mouth once as needed for migraine. May repeat in 2 hours if headache persists or recurs. (Patient not taking: Reported on 03/17/2024), Disp: 9 tablet, Rfl: 5  Current Facility-Administered Medications:    bupivacaine (PF) (MARCAINE ) 0.5 % injection 2 mL, 2 mL, Other, Once, Eoghan Belcher A, MD   lidocaine  (XYLOCAINE ) 2 % (with pres) injection 40 mg, 2 mL, Intradermal, Once, Amerie Beaumont, Charlie LABOR, MD  PAST MEDICAL HISTORY: Past Medical History:  Diagnosis Date   Anxiety    Blind right eye 2009   swelling & pressure   Cluster headaches    started after seizure and migraines   Complication of anesthesia    trouble with short term memory after surgeries   COVID-19    Depression    Diastolic dysfunction    a. 01/2021 Echo: EF 60-65%, no rwma, GrII DD, nl RV fxn, mildly dil LA. Triv AI.   Fibroids    s/p TLH, bilateral salpingectomy   GERD (gastroesophageal reflux disease)    patient thinks due to medications   History of anemia    prior to hysterectomy   History of pneumonia    History of trichomoniasis    Seizures (HCC) x 1 2-3 yrs ago none since   once saw neurologist, full body brain MRI and another 6 months lster   Status post right oophorectomy    Thrombocytopenia    sees [facility] with Heme Onc   Vasculitis    neuro    PAST SURGICAL HISTORY: Past Surgical History:  Procedure Laterality Date   CYSTOSCOPY N/A 07/31/2017   Procedure: CYSTOSCOPY;  Surgeon: Cleotilde Ronal RAMAN, MD;  Location: [facility];  Service: Gynecology;  Laterality: N/A;   ENDOMETRIAL ABLATION     EYE SURGERY     laser - removed blood from right eye   HYSTEROSCOPY WITH D & C     LAPAROSCOPY N/A 09/19/2017   Procedure: LAPAROSCOPY DIAGNOSTIC WITH RIGHT OOPHERECTOMY, LYSIS OF ADHESIONS;  Surgeon: Jertson, Jill Evelyn, MD;  Location: [facility];  Service: Gynecology;  Laterality: N/A;   OMENTECTOMY N/A  11/21/2017   Procedure: OMENTECTOMY AND PELVIC WASHINGS;  Surgeon: Anitra Freddy NOVAK, MD;  Location: [facility];  Service: Gynecology;  Laterality: N/A;   ROBOTIC ASSISTED SALPINGO OOPHERECTOMY Left 11/21/2017   Procedure: XI ROBOTIC ASSISTED LEFT SALPINGO OOPHORECTOMY;  Surgeon: Anitra Freddy NOVAK, MD;  Location: [facility];  Service: Gynecology;  Laterality: Left;   TOTAL LAPAROSCOPIC HYSTERECTOMY WITH SALPINGECTOMY Bilateral 07/31/2017   Procedure: TOTAL LAPAROSCOPIC HYSTERECTOMY WITH SALPINGECTOMY;  Surgeon: Cleotilde Ronal RAMAN, MD;  Location: [facility];  Service: Gynecology;  Laterality: Bilateral;  20 week size uterus/ Alexis bag in room/ need 4.5 hours   TUBAL LIGATION     WISDOM TOOTH EXTRACTION      FAMILY HISTORY: Family History  Problem Relation Age of Onset   Sleep apnea Mother    Alcoholism Father    Cirrhosis Father    Alcohol abuse Father    Cancer Maternal Grandmother        unsure of type; died of other causes   Heart disease Maternal Aunt    Drug abuse Cousin     SOCIAL HISTORY:  Social History   Socioeconomic History   Marital status: Single    Spouse name: Not on file   Number of children: 3   Years of education: Not on file   Highest education level: Associate degree: occupational, scientist, product/process development, or vocational program  Occupational History   Not on file  Tobacco Use   Smoking status: Former    Current packs/day: 0.00    Types: Cigarettes, E-cigarettes    Start date: 05/05/1998    Quit date: 05/05/2018  Years since quitting: 5.8   Smokeless tobacco: Never  Vaping Use   Vaping status: Former  Substance and Sexual Activity   Alcohol use: Not Currently   Drug use: No   Sexual activity: Yes    Birth control/protection: Surgical    Comment: hysterectomy  Other Topics Concern   Not on file  Social History Narrative   Not on file   Social Drivers of Health   Tobacco Use: Medium Risk (03/17/2024)   Patient History    Smoking Tobacco Use: Former    Smokeless Tobacco Use:  Never    Passive Exposure: Not on file  Financial Resource Strain: Medium Risk (04/12/2023)   Overall Financial Resource Strain (CARDIA)    Difficulty of Paying Living Expenses: Somewhat hard  Food Insecurity: Food Insecurity Present (04/12/2023)   Hunger Vital Sign    Worried About Running Out of Food in the Last Year: Often true    Ran Out of Food in the Last Year: Sometimes true  Transportation Needs: No Transportation Needs (04/12/2023)   PRAPARE - Administrator, Civil Service (Medical): No    Lack of Transportation (Non-Medical): No  Physical Activity: Inactive (04/12/2023)   Exercise Vital Sign    Days of Exercise per Week: 0 days    Minutes of Exercise per Session: 0 min  Stress: Stress Concern Present (04/12/2023)   [employer] of Occupational Health - Occupational Stress Questionnaire    Feeling of Stress : Very much  Social Connections: Socially Isolated (04/12/2023)   Social Connection and Isolation Panel    Frequency of Communication with Friends and Family: Never    Frequency of Social Gatherings with Friends and Family: Never    Attends Religious Services: Never    Database Administrator or Organizations: No    Attends Banker Meetings: Never    Marital Status: Never married  Intimate Partner Violence: Not At Risk (04/12/2023)   Humiliation, Afraid, Rape, and Kick questionnaire    Fear of Current or Ex-Partner: No    Emotionally Abused: No    Physically Abused: No    Sexually Abused: No  Depression (PHQ2-9): High Risk (02/22/2024)   Depression (PHQ2-9)    PHQ-2 Score: 19  Alcohol Screen: Low Risk (04/12/2023)   Alcohol Screen    Last Alcohol Screening Score (AUDIT): 0  Housing: Medium Risk (02/23/2024)   Received from [facility]   Epic    What is your living situation today?: I have a place to live today, but I am worried about losing it in the future    Think about the place you live. Do you have problems with any of the following? Choose  all that apply:: Pt declined to answer  Utilities: At Risk (04/12/2023)   [facility] Utilities    Threatened with loss of utilities: Yes  Health Literacy: Adequate Health Literacy (04/12/2023)   B1300 Health Literacy    Frequency of need for help with medical instructions: Never     PHYSICAL EXAM  Vitals:   03/17/24 1516  BP: 110/73  Pulse: 74  Weight: 169 lb (76.7 kg)    Body mass index is 33.01 kg/m.   General: The patient is well-developed and well-nourished and in no acute distress.  Eyes:   She has mild conjunctival erythema on the right..    Neck: She has tenderness over the right occiput and the right splenius capitis muscle.  The  range of motion is fairly normal in the neck.  Neurologic Exam  Mental status: The patient is alert and oriented x 3 at the time of the examination. The patient has apparent normal recent and remote memory, with an apparently normal attention span and concentration ability.   Speech is normal.  Cranial nerves: Extraocular movements are full.  Very mild right ptosis and facial strength is normal elsewhere.  There is normal facial sensation to soft touch.   Trapezius and sternocleidomastoid strength is normal. No dysarthria is noted.  Hearing is symmetric.  Motor:  Muscle bulk is normal.   Tone is normal. Strength is  5 / 5 in all 4 extremities.   Sensory: She has normal sensation to touch and vibration in the arms and legs   Gait and station: Station is normal.  Her gait is normal though the tandem gait is slightly wide. DTRs:  Normal and symmetric    DIAGNOSTIC DATA (LABS, IMAGING, TESTING) - I reviewed patient records, labs, notes, testing and imaging myself where available.  Lab Results  Component Value Date   WBC 5.7 11/07/2023   HGB 13.5 11/07/2023   HCT 40.9 11/07/2023   MCV 88 11/07/2023   PLT 121 (L) 11/07/2023      Component Value Date/Time   NA 142 11/30/2023 1024   NA 138 08/31/2016 0957   K 4.2 11/30/2023 1024   K 3.9  08/31/2016 0957   CL 105 11/30/2023 1024   CO2 19 (L) 11/30/2023 1024   CO2 22 08/31/2016 0957   GLUCOSE 88 11/30/2023 1024   GLUCOSE 148 (H) 07/10/2018 0915   GLUCOSE 106 08/31/2016 0957   BUN 6 11/30/2023 1024   BUN 8.6 08/31/2016 0957   CREATININE 0.79 11/30/2023 1024   CREATININE 0.8 08/31/2016 0957   CALCIUM  10.2 11/30/2023 1024   CALCIUM  10.3 08/31/2016 0957   PROT 7.7 11/30/2023 1024   PROT 7.6 08/31/2016 0957   ALBUMIN 4.8 11/30/2023 1024   ALBUMIN 3.9 08/31/2016 0957   AST 25 11/30/2023 1024   AST 18 08/31/2016 0957   ALT 57 (H) 11/30/2023 1024   ALT 23 08/31/2016 0957   ALKPHOS 110 11/30/2023 1024   ALKPHOS 62 08/31/2016 0957   BILITOT 0.6 11/30/2023 1024   BILITOT 0.43 08/31/2016 0957   GFRNONAA 111 02/04/2019 0902   GFRAA 128 02/04/2019 0902         PROCEDURE Sphenopalatine ganglion block The patient was placed in the supine position.  A 20g catheter was lubricated with gel, and placed in the right naris. The catheter was inserted above the middle turbinate towards the posterior nasal cavity. .  2 mL of 2% lidocaine  was placed. The patient was asked to swallow during the injection. The patient demonstrated erythema of the sclera of the eye on this side, and tearing.   The patient tolerated the procedure well. No complications of the procedure were noted. The patient was kept in the supine position for 2 minutes following the procedure.  By 3 minutes the headache had resolved.     ASSESSMENT AND PLAN  CNS vasculitis  Seizures (HCC)  Vascular headache  Nocturnal hypoxemia  High risk medication use  Chronic tension-type headache, intractable  Central retinal vein occlusion of right eye, unspecified complication status (HCC)   1.   Continue Rituximab  for CNS vaculitis.  Labs were ok 11/2023 (IgM mildly low but IgG is normal) 2.   Continue Vimpat  for seizures.    3.  Stay active and exercise as tolerated.  4.    She has nocturnal  hypoxemia.   Continue  2L O2 at night.  5.   Ubrelvy  100 mg prn (2 samples [id number] 02/2025) and send in script.  Also gave samples of Nurtec [id number] A 01/2027.  Addendum: If there is no benefit she can take a Tylenol  3 and we can refill if needed 6.   Right sphenopalatine ganglion block with 2 cc of 2% lidocaine  (details above).  She reported pain was much better afterwards. 7.   Right splenius capitus trigger point injection with 12 mg celestone  in 3 cc Lidocaine  using sterile technique.  No complications.  She tolerated the procedure well and pain was better afterwards.  return in 6 months.  She will call if she has new or worsening symptoms.    Elon Eoff A. Vear, MD, PhD 03/17/2024, 5:37 PM Certified in Neurology, Clinical Neurophysiology, Sleep Medicine, Pain Medicine and Neuroimaging   [facility] [address] [place], [place] [address] [phone] "

## 2024-03-24 ENCOUNTER — Other Ambulatory Visit (HOSPITAL_BASED_OUTPATIENT_CLINIC_OR_DEPARTMENT_OTHER): Payer: Self-pay

## 2024-03-27 ENCOUNTER — Other Ambulatory Visit (HOSPITAL_BASED_OUTPATIENT_CLINIC_OR_DEPARTMENT_OTHER): Payer: Self-pay

## 2024-03-27 ENCOUNTER — Telehealth (HOSPITAL_BASED_OUTPATIENT_CLINIC_OR_DEPARTMENT_OTHER): Payer: Self-pay | Admitting: Pharmacy Technician

## 2024-03-27 ENCOUNTER — Telehealth (HOSPITAL_BASED_OUTPATIENT_CLINIC_OR_DEPARTMENT_OTHER): Payer: Self-pay | Admitting: Family Medicine

## 2024-03-27 ENCOUNTER — Other Ambulatory Visit: Payer: Self-pay

## 2024-03-27 ENCOUNTER — Other Ambulatory Visit (HOSPITAL_COMMUNITY): Payer: Self-pay

## 2024-03-27 NOTE — Telephone Encounter (Signed)
 Copied from CRM #[id number]. Topic: Clinical - Medication Question >> Mar 27, 2024 12:07 PM Rosaria BRAVO wrote: Reason for CRM: Pt wants to know if there are any GLP samples available in the clinic? Please advise if so

## 2024-03-27 NOTE — Telephone Encounter (Signed)
 Pharmacy Patient Advocate Encounter   Received notification from Pt Calls Messages that prior authorization for Mounjaro  is required/requested.   Insurance verification completed.   The patient is insured through [employer].   What strength Mounjaro  does she need? I don't see it on her med list yet.

## 2024-03-27 NOTE — Telephone Encounter (Signed)
 PA request has been Received. New Encounter has been or will be created for follow up. For additional info see Pharmacy Prior Auth telephone encounter from 03/27/24.

## 2024-03-27 NOTE — Telephone Encounter (Signed)
 Copied from CRM #[id number]. Topic: Clinical - Prescription Issue >> Mar 27, 2024 11:59 AM Angel Rodriguez wrote: Reason for CRM: Pt called reporting that she needs Mounjaro  with a prior authorization sent via cover my meds. Says she needs this urgently. Her insurance does not cover her former GLP drug. Says she does not want to run out.   Urgent fax number for the prior auth: [phone]  Says she was told If they can get it done they can get it you by lunch time tomorrow   New insurance is:   Tourist Information Centre Manager Next Group number: [id number] Member ID: [id number] Phone number for Provider services/Prior auth: [phone]

## 2024-03-28 ENCOUNTER — Other Ambulatory Visit (HOSPITAL_BASED_OUTPATIENT_CLINIC_OR_DEPARTMENT_OTHER): Payer: Self-pay

## 2024-03-28 ENCOUNTER — Other Ambulatory Visit: Payer: Self-pay

## 2024-03-28 NOTE — Telephone Encounter (Signed)
 Left VM requesting a return call. Caudle, FNP needs to know when was patient's last dosage of Victoza .

## 2024-03-28 NOTE — Telephone Encounter (Signed)
 Copied from CRM #[id number]. Topic: Clinical - Prescription Issue >> Mar 27, 2024 11:59 AM Rosaria BRAVO wrote: Reason for CRM: Pt called reporting that she needs Mounjaro  with a prior authorization sent via cover my meds. Says she needs this urgently. Her insurance does not cover her former GLP drug. Says she does not want to run out.   Urgent fax number for the prior auth: [phone]  Says she was told If they can get it done they can get it you by lunch time tomorrow   New insurance is:   Tourist Information Centre Manager Next Group number: [id number] Member ID: [id number] Phone number for Provider services/Prior auth: [phone] >> Mar 28, 2024  1:46 PM Montie POUR wrote: Left VM requesting a return call. Caudle, FNP needs to know when was patient's last dosage of Victoza .   Ms. Badertscher took Victoza  yesterday. She has not left of Victoza .

## 2024-03-30 ENCOUNTER — Encounter (HOSPITAL_BASED_OUTPATIENT_CLINIC_OR_DEPARTMENT_OTHER): Payer: Self-pay | Admitting: Family Medicine

## 2024-03-30 ENCOUNTER — Other Ambulatory Visit (HOSPITAL_BASED_OUTPATIENT_CLINIC_OR_DEPARTMENT_OTHER): Payer: Self-pay | Admitting: Family Medicine

## 2024-03-30 MED ORDER — TIRZEPATIDE 2.5 MG/0.5ML ~~LOC~~ SOAJ: 2.5000 mg | SUBCUTANEOUS | 0 refills | Status: AC

## 2024-03-31 ENCOUNTER — Other Ambulatory Visit (HOSPITAL_COMMUNITY): Payer: Self-pay

## 2024-03-31 NOTE — Telephone Encounter (Signed)
 Pharmacy Patient Advocate Encounter  Received notification from [facility] [employer] that Prior Authorization for Mounjaro  2.5mg  has been APPROVED from 03/31/24 to 03/31/25. Ran test claim, Copay is $20. This test claim was processed through [facility]- copay amounts may vary at other pharmacies due to pharmacy/plan contracts, or as the patient moves through the different stages of their insurance plan.   PA #/Case ID/Reference #: [id number]

## 2024-03-31 NOTE — Telephone Encounter (Signed)
 Pharmacy Patient Advocate Encounter   Per test claim: PA required; PA submitted to above mentioned insurance via Latent Key/confirmation #/EOC [facility] Status is pending

## 2024-04-07 ENCOUNTER — Other Ambulatory Visit (HOSPITAL_COMMUNITY): Payer: Self-pay | Admitting: Physician Assistant

## 2024-04-07 DIAGNOSIS — F4310 Post-traumatic stress disorder, unspecified: Secondary | ICD-10-CM

## 2024-04-07 DIAGNOSIS — F411 Generalized anxiety disorder: Secondary | ICD-10-CM

## 2024-04-07 DIAGNOSIS — F332 Major depressive disorder, recurrent severe without psychotic features: Secondary | ICD-10-CM

## 2024-04-25 ENCOUNTER — Telehealth (HOSPITAL_COMMUNITY): Payer: Self-pay | Admitting: Physician Assistant

## 2024-06-12 ENCOUNTER — Ambulatory Visit: Admitting: Neurology

## 2024-06-26 ENCOUNTER — Encounter (HOSPITAL_BASED_OUTPATIENT_CLINIC_OR_DEPARTMENT_OTHER): Admitting: Family Medicine
